# Patient Record
Sex: Male | Born: 1940 | Race: White | Hispanic: No | Marital: Married
Health system: Midwestern US, Academic
[De-identification: ages and names within clinical notes are randomized; demographics above are authoritative.]

## PROBLEM LIST (undated history)

## (undated) DIAGNOSIS — R748 Abnormal levels of other serum enzymes: Secondary | ICD-10-CM

## (undated) DIAGNOSIS — E1165 Type 2 diabetes mellitus with hyperglycemia: Principal | ICD-10-CM

## (undated) DIAGNOSIS — L259 Unspecified contact dermatitis, unspecified cause: Secondary | ICD-10-CM

## (undated) DIAGNOSIS — M48062 Spinal stenosis, lumbar region with neurogenic claudication: Secondary | ICD-10-CM

## (undated) DIAGNOSIS — E119 Type 2 diabetes mellitus without complications: Principal | ICD-10-CM

## (undated) DIAGNOSIS — M5442 Lumbago with sciatica, left side: Secondary | ICD-10-CM

## (undated) DIAGNOSIS — E559 Vitamin D deficiency, unspecified: Secondary | ICD-10-CM

## (undated) DIAGNOSIS — E7800 Pure hypercholesterolemia, unspecified: Secondary | ICD-10-CM

## (undated) DIAGNOSIS — Z1211 Encounter for screening for malignant neoplasm of colon: Secondary | ICD-10-CM

## (undated) DIAGNOSIS — Z125 Encounter for screening for malignant neoplasm of prostate: Secondary | ICD-10-CM

## (undated) DIAGNOSIS — M5441 Lumbago with sciatica, right side: Secondary | ICD-10-CM

## (undated) DIAGNOSIS — K2970 Gastritis, unspecified, without bleeding: Secondary | ICD-10-CM

## (undated) DIAGNOSIS — E039 Hypothyroidism, unspecified: Principal | ICD-10-CM

## (undated) LAB — HM DIABETES EYE EXAM

## (undated) LAB — HM DM FOOT MONOFILAMENT AND PULSE: hm dm foot monofilament and pulse: NORMAL

---

## 2003-05-27 NOTE — Unmapped (Signed)
Signed by Hubbard Hartshorn MD on 05/27/2003 at 00:00:00  Polysomnography      Imported By: Betsey Amen 01/02/2009 10:10:58    _____________________________________________________________________    External Attachment:    Please see [employer] EMR for this document.

## 2003-06-11 NOTE — Unmapped (Signed)
Signed by Hubbard Hartshorn MD on 06/11/2003 at 00:00:00  Polysomnography      Imported By: Betsey Amen 01/02/2009 10:11:33    _____________________________________________________________________    External Attachment:    Please see [employer] EMR for this document.

## 2003-11-29 NOTE — Unmapped (Signed)
Signed by Hubbard Hartshorn MD on 11/29/2003 at 00:00:00  Physician Order      Imported By: Betsey Amen 01/02/2009 10:11:58    _____________________________________________________________________    External Attachment:    Please see [employer] EMR for this document.

## 2003-12-06 NOTE — Unmapped (Signed)
Signed by Hubbard Hartshorn MD on 12/06/2003 at 00:00:00  CPAP Setup      Imported By: Betsey Amen 01/02/2009 10:12:24    _____________________________________________________________________    External Attachment:    Please see [employer] EMR for this document.

## 2005-05-26 NOTE — Unmapped (Signed)
Signed by   LinkLog[employer] on 05/26/2005 at 09:18:17    Appointment status changed to no show by  [employer] on 05/26/2005 9:18 AM.    No Show Comments  ----------------  (MAS) PHYSICAL EXAM    Appointment Information  -----------------------  Appt Type:         Date:  Thursday, May 26, 2005       Time:  8:30 AM for 45 min    Urgency:  Routine    Made By:  Jannett Celestine   To Visit:  [name] MD     Reason:  (MAS) PHYSICAL EXAM    Appt Comments  -------------  -- 05/26/05 9:18: (ADAMS[facility]) NO SHOW --  (MAS) PHYSICAL EXAM  PHY    -- 03/22/05 11:34: (ADAMSAP) BOOKED --  Routine  at 05/26/2005 8:30 AM for 45 min  (MAS) PHYSICAL EXAM  PHY

## 2005-08-08 NOTE — Unmapped (Signed)
Signed by Lolita Patella. Gerrety RMA on 08/08/2005 at 08:12:10      Preload Clinical Lists   Problems added:   HYPERTENSION (ICD-401.9)  HYPERLIPIDEMIA (ICD-272.4)  ATRIAL FIBRILLATION (ICD-427.31)  CHF (ICD-428.0)    Medications added:   PACERONE 200 MG TABS (AMIODARONE HCL) 1 1/2 po daly  COUMADIN TABS (WARFARIN SODIUM TABS) As directed  VYTORIN 10-20 MG TABS (EZETIMIBE-SIMVASTATIN) one po daily  HYZAAR 50-12.5 MG TABS (LOSARTAN POTASSIUM-HCTZ) one po daily  CORGARD 20 MG TABS (NADOLOL) 1 po twice a day

## 2005-08-12 LAB — MICROALBUMIN (RANDOM UR)
Creatinine, Random Urine: 209 mL/min (ref 20–370)
Microalb / UCreat: 6 mg/g (ref <30)
Microalb, Ur: 1.2 mg/dL

## 2005-08-12 LAB — PROTIME-INR
INR: 1.5
Protime: 15.2 s (ref 9.0–11.5)

## 2005-08-12 LAB — PSA, TOTAL AND FREE: PSA: 0.9 ng/mL (ref <=4.0)

## 2005-08-12 NOTE — Unmapped (Signed)
Signed by [name] MD on 08/15/2005 at 09:34:51  Patient: [name]  Note: All result statuses are Final unless otherwise noted.    Tests: (1) MICROALBUMIN, RANDOM URINE (W/CREATININE) ([id number])   CREATININE, RANDOM URINE                              209 mg/dL                   38-756    MICROALBUMIN              1.2 mg/dL      REFERENCE RANGE      NOT ESTABLISHED     MICROALBUMIN/CREATININE RATIO, RANDOM URINE                              6 mcg/mg creat              <30             THE ADA (DIABETES CARE 26:S94-S98, 2003) DEFINES      ABNORMALITIES IN ALBUMIN EXCRETION AS FOLLOWS:             CATEGORY         RESULT (MCG/MG CREATININE)             NORMAL                    <30      MICROALBUMINURIA         30-299       CLINICAL ALBUMINURIA   > OR = 300             THE ADA RECOMMENDS THAT AT LEAST TWO OF THREE      SPECIMENS COLLECTED WITHIN A 3-6 MONTH PERIOD BE      ABNORMAL BEFORE CONSIDERING A PATIENT TO BE      WITHIN A DIAGNOSTIC CATEGORY.    Note: An exclamation mark (!) indicates a result that was not dispersed into   the flowsheet.  Document Creation Date: 08/13/2005 6:47 AM  _______________________________________________________________________    (1) Order result status: Final  Collection or observation date-time: 08/12/2005 09:55  Requested date-time:   Receipt date-time: 08/12/2005 21:07  Reported date-time: 08/13/2005 06:00  Referring Physician:    Ordering Physician: [name] [facility])  Specimen Source: R  Source: Arline Asp Order Number: [id number]  Lab site: [facility] [place]      [address]      [place]  [facility]  [address]      -----------------    The following lab values were dispersed to the flowsheet  with no units conversion:      CREATININE, RANDOM URINE, 209 MG/DL, (F)  expected units: mL/min    MICROALBUMIN/CREATININE RATIO, RANDOM URINE, 6 MCG/MG CREAT, (F)  expected   units: mg/g

## 2005-08-12 NOTE — Unmapped (Signed)
Signed by [name] MD on 08/15/2005 at 09:35:21  Patient: [name]  Note: All result statuses are Final unless otherwise noted.    Tests: (1) PROTHROMBIN TIME WITH INR ([id number])   INTERNATIONAL NORMALIZED RATIO (INR)                              1.5                INR REFERENCE INTERVAL APPLIES TO PATIENTS                NOT ON ANTICOAGULANT THERAPY:                    0.9- 1.1                       SUGGESTED INR THERAPEUTIC RANGE FOR ORAL                ANTICOAGULANT THERAPY (STABLY ANTICOAGULATED                PATIENTS)                                 ROUTINE THERAPY:                       2.0- 3.0                                 RECURRENT MYOCARDIAL INFARCTION                          OR MECHANICAL PROSTHETIC VALVES:       2.5- 3.5                  PROTHROMBIN TIME     [H]  15.2 sec                    9.0-11.5    Note: An exclamation mark (!) indicates a result that was not dispersed into   the flowsheet.  Document Creation Date: 08/13/2005 6:47 AM  _______________________________________________________________________    (1) Order result status: Final  Collection or observation date-time: 08/12/2005 09:55  Requested date-time:   Receipt date-time: 08/12/2005 21:07  Reported date-time: 08/13/2005 06:00  Referring Physician:    Ordering Physician: [name] [facility])  Specimen Source: P  Source: Arline Asp Order Number: [id number]  Lab site: [facility] [place]      [address]      [place]  [place]  [address]

## 2005-08-12 NOTE — Unmapped (Signed)
Signed by [name] MD on 08/15/2005 at 09:36:21  Patient: [name]  Note: All result statuses are Final unless otherwise noted.    Tests: (1) PSA, TOTAL ([id number])    PSA, TOTAL                0.9 ng/mL                   < OR = 4.0      PSA VALUES FROM DIFFERENT ASSAY METHODS CANNOT BE      USED INTERCHANGEABLY. THIS ASSAY WAS PERFORMED      USING THE BAYER CHEMILUMINESCENT METHOD.    Note: An exclamation mark (!) indicates a result that was not dispersed into   the flowsheet.  Document Creation Date: 08/13/2005 6:47 AM  _______________________________________________________________________    (1) Order result status: Final  Collection or observation date-time: 08/12/2005 09:55  Requested date-time:   Receipt date-time: 08/12/2005 21:07  Reported date-time: 08/13/2005 06:00  Referring Physician:    Ordering Physician: [name] [facility])  Specimen Source: S  Source: Arline Asp Order Number: [id number]  Lab site: [facility] [place]      [address]      [place]  [place]  [address]

## 2005-08-15 NOTE — Unmapped (Signed)
Signed by Lanney Gins MA on 08/18/2005 at 10:58:27      Follow-up for Test Results:   Comments: His INR is only 1.5 -- please find out his current Warfarin dose.  Also, his PSA and microalbumin ( urine test ) are normal -- please let him know.  Follow-up by: [name] MD,  Aug 15, 2005 9:37 AM    Additional Follow-up for Test Results:   Comments: lm to cb  Additional Follow-up by: Lanney Gins MA,  Aug 15, 2005 10:48 AM    Additional Follow-up for Test Results:   Comments: lmom to cb  Additional Follow-up by: Lanney Gins MA,  Aug 16, 2005 7:17 PM    LMOM TO [facility]      pt taking 2.5 mg on m,w,f  - 5mg  all other days    Have him increase his coumadin to 2.5mg  on Wednesdays and 5mg  all other days, recheck in 3 week and prn    LN MD         lmom sh

## 2005-08-16 NOTE — Unmapped (Signed)
Signed by Lanney Gins MA on 08/16/2005 at 15:52:37    Phone Note   Patient Call  Call back at Home Phone: [phone]  Caller: spouse -  Call for: [facility]    Summary of Call: Pt's wife calling with information on Coumadin    Current: 2.5mg  M W F                5mg  all other days    if anything needs to change, pls call wife - [name] is out of town     Initial call taken by: Emmaline Life,  Aug 16, 2005 2:28 PM      Follow-up for Phone Call   change to 5mg  all days except 2.5mg  on wednesday and recheck in four weeks and prn  Follow-up by: [name] MD,  Aug 16, 2005 3:03 PM    Additional Follow-up for Phone Call   wife informed  Additional Follow-up by: Lanney Gins MA,  Aug 16, 2005 3:52 PM

## 2005-08-18 NOTE — Unmapped (Signed)
Signed by [name] MD on 08/18/2005 at 10:58:17    Clinical Lists Changes    Medications:  Changed medication from COUMADIN TABS (WARFARIN SODIUM TABS) As directed to COUMADIN 5 MG TABS (WARFARIN SODIUM) 1 daily except one half (2.5 mg) on Wednesdays

## 2005-09-09 NOTE — Unmapped (Signed)
Signed by Lanney Gins MA on 09/09/2005 at 16:59:45    Phone Note   Patient Call  Call back at Home Phone: [phone]  Caller: patient  Call for: [facility]    Summary of Call: Had Physical about 1 month ago - did not get letter, would like to get it please         Initial call taken by: Emmaline Life,  September 09, 2005 12:41 PM      Follow-up for Phone Call   we have a normal psa and a normal urine test only -- please check paper chart to see if anything else came in/was ordered  Follow-up by: [name] MD,  September 09, 2005 4:25 PM    Additional Follow-up for Phone Call   no other tests ordered. pt inf.  Additional Follow-up by: Lanney Gins MA,  September 09, 2005 4:59 PM

## 2005-09-12 LAB — PROTIME-INR
INR: 2.4
Protime: 22.8 s (ref 9.0–11.5)

## 2005-09-12 NOTE — Unmapped (Signed)
Signed by [name] MD on 09/12/2005 at 00:00:00  Disclosure      Imported By: Ileene Hutchinson 12/19/2005 09:00:29    _____________________________________________________________________    External Attachment:    Please see [employer] EMR for this document.

## 2005-09-12 NOTE — Unmapped (Signed)
Signed by [name] MD on 09/13/2005 at 05:56:44  Patient: [name]  Note: All result statuses are Final unless otherwise noted.    Tests: (1) PROTHROMBIN TIME WITH INR ([id number])   INTERNATIONAL NORMALIZED RATIO (INR)                              2.4                INR REFERENCE INTERVAL APPLIES TO PATIENTS                NOT ON ANTICOAGULANT THERAPY:                    0.9- 1.1                       SUGGESTED INR THERAPEUTIC RANGE FOR ORAL                ANTICOAGULANT THERAPY (STABLY ANTICOAGULATED                PATIENTS)                                 ROUTINE THERAPY:                       2.0- 3.0                                 RECURRENT MYOCARDIAL INFARCTION                          OR MECHANICAL PROSTHETIC VALVES:       2.5- 3.5                  PROTHROMBIN TIME     [H]  22.8 sec                    9.0-11.5    Note: An exclamation mark (!) indicates a result that was not dispersed into   the flowsheet.  Document Creation Date: 09/13/2005 3:03 AM  _______________________________________________________________________    (1) Order result status: Final  Collection or observation date-time: 09/12/2005 10:03  Requested date-time:   Receipt date-time: 09/12/2005 21:48  Reported date-time: 09/13/2005 02:00  Referring Physician:    Ordering Physician: [name] [facility])  Specimen Source: P  Source: Arline Asp Order Number: [id number]  Lab site: [facility] [place]      [address]      [place]  [place]  [address]

## 2005-09-12 NOTE — Unmapped (Signed)
Signed by [name] MD on 09/12/2005 at 00:00:00  Privacy Notice      Imported By: Ileene Hutchinson 12/19/2005 09:00:45    _____________________________________________________________________    External Attachment:    Please see [employer] EMR for this document.

## 2005-09-12 NOTE — Unmapped (Addendum)
Signed by Lanney Gins MA on 09/12/2005 at 09:35:26      Coumadin Management Preload   Indication for Coumadin Therapy: Atrial Fibrillation    Vital Signs:     Pulse: 80  BP: 158/90    Relative Factors:   Any missed doses: no  Any new meds/antibiotics: yes  If yes, describe: d/c pacerone x 1 month  Any dietary changes: no  Any bleeding problems: no    Previous values:   New Coumadin Regimen 5 mg qd except 2.5 on wednesdays  Recorded by: Lanney Gins MA on September 12, 2005 9:34 AM    Signed by Tama Headings MA on 04/13/2006 at 11:06:31

## 2005-09-13 NOTE — Unmapped (Signed)
Signed by Tama Headings MA on 09/13/2005 at 15:59:39    Phone Note   Patient Call  Call back at Home Phone: [phone]  Caller: patient  Call for: [facility]    Summary of Call: Wants to know name of ENT to see for his sinus problem. Can leave on voicemail.     Initial call taken by: Barnetta Hammersmith,  September 13, 2005 3:08 PM      Follow-up for Phone Call   [facility]  Follow-up by: [name] MD,  September 13, 2005 3:47 PM    Additional Follow-up for Phone Call   Called pt with info re Dr. Tobie Lords.  Gave name, address, & phone #  Additional Follow-up by: Tama Headings MA,  September 13, 2005 3:59 PM

## 2005-09-13 NOTE — Unmapped (Signed)
Signed by Lanney Gins MA on 09/13/2005 at 12:41:46      Coumadin Management   Indication for Coumadin Therapy: Atrial Fibrillation    Patient PT: 22.8 (09/12/2005 10:03:00 AM)  INR: 2.4 (09/12/2005 10:03:00 AM)    Relative Factors:   Any missed doses: no  Any new meds/antibiotics: no  Any dietary changes: no  Any bleeding problems: no  Next Protime in: 1 month    Provider Instructions   Advise patient: PT/INR is OK.  No change in dosage of coumadin.  Results reviewed by: [name] MD on September 13, 2005 5:56 AM    Nursing Notes   Instructions given to: Patient  Called by: Lanney Gins MA on September 13, 2005 12:41 PM

## 2005-09-28 NOTE — Unmapped (Signed)
Signed by [name] MD on 09/28/2005 at 00:00:00  ENT      Imported By: Ileene Hutchinson 12/28/2005 16:32:58    _____________________________________________________________________    External Attachment:    Please see [employer] EMR for this document.

## 2005-10-27 NOTE — Unmapped (Signed)
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  Initial call taken by: Emmaline Life,  October 27, 2005 11:53 AM      Follow-up for Phone Call   Usually best if he's off he coumadin first for three days.  Follow-up by: [name] MD,  October 27, 2005 12:41 PM    Additional Follow-up for Phone Call   dr Salli Real inf  Additional Follow-up by: Lanney Gins MA,  October 27, 2005 12:45 PM

## 2005-10-28 LAB — PROTIME-INR
INR: 1.6
Protime: 15.6 s — ABNORMAL HIGH (ref 9.0–11.5)

## 2005-10-28 NOTE — Unmapped (Addendum)
Signed by Christie Nottingham LPN on 73/22/0254 at 09:56:24      Coumadin Management Preload   Indication for Coumadin Therapy: Atrial Fibrillation    Vital Signs:     Pulse: 68 (regular)  BP: 164/94    Relative Factors:   Any missed doses: no  Any new meds/antibiotics: no  Any dietary changes: no  Any bleeding problems: no  Comments: pt is to hold coumadin starting today for dental tx on 10/31/05    Previous values:   Target INR:   2.0-3.0  Treatment Duration: indefinitely    Signed by Tama Headings MA on 04/07/2006 at 16:13:50          Clinical Lists Changes    Orders:  [place] [id number] - Ofc Vst, Est Level I ([id number]) - Signed

## 2005-10-28 NOTE — Unmapped (Signed)
Signed by [name] MD on 10/30/2005 at 21:46:09  Patient: [name]  Note: All result statuses are Final unless otherwise noted.    Tests: (1) PROTHROMBIN TIME WITH INR ([id number])   INTERNATIONAL NORMALIZED RATIO (INR)                              1.6                INR REFERENCE INTERVAL APPLIES TO PATIENTS                NOT ON ANTICOAGULANT THERAPY:                    0.9- 1.1                       SUGGESTED INR THERAPEUTIC RANGE FOR ORAL                ANTICOAGULANT THERAPY (STABLY ANTICOAGULATED                PATIENTS)                                 ROUTINE THERAPY:                       2.0- 3.0                                 RECURRENT MYOCARDIAL INFARCTION                          OR MECHANICAL PROSTHETIC VALVES:       2.5- 3.5                  PROTHROMBIN TIME     [H]  15.6 sec                    9.0-11.5    Note: An exclamation mark (!) indicates a result that was not dispersed into   the flowsheet.  Document Creation Date: 10/29/2005 1:19 AM  _______________________________________________________________________    (1) Order result status: Final  Collection or observation date-time: 10/28/2005 09:14  Requested date-time:   Receipt date-time: 10/28/2005 23:35  Reported date-time: 10/29/2005 01:00  Referring Physician:    Ordering Physician: [name] [facility])  Specimen Source: P  Source: Arline Asp Order Number: [id number]  Lab site: [facility] [place]      [address]      [place]  [place]  [address]

## 2005-10-30 NOTE — Unmapped (Signed)
Signed by Lanney Gins MA on 10/31/2005 at 14:39:06      Coumadin Management   Indication for Coumadin Therapy: Atrial Fibrillation    Patient PT: 15.6 (10/28/2005 9:14:00 AM)  INR: 1.6 (10/28/2005 9:14:00 AM)  New Coumadin Regimen 5mg  daily  Next Protime in: 1 month    Provider Instructions   Advise patient: PT/INR is too low and the dose of coumadin needs to be increased.  Results reviewed by: [name] MD on October 30, 2005 9:47 PM    Nursing Notes   Instructions given to: Patient  Called by: Lanney Gins MA on October 31, 2005 2:39 PM

## 2005-12-22 LAB — PROTIME-INR
INR: 2.9
Protime: 27.3 s (ref 9.0–11.5)

## 2005-12-22 LAB — OFFICE VISIT LAB RESULTS
INR: 1.6
Protime: 15.6 s

## 2005-12-22 NOTE — Unmapped (Signed)
Signed by [name] MD on 12/23/2005 at 08:12:08  Patient: [name]  Note: All result statuses are Final unless otherwise noted.    Tests: (1) PROTHROMBIN TIME WITH INR ([id number])   INTERNATIONAL NORMALIZED RATIO (INR)                              2.9                INR REFERENCE INTERVAL APPLIES TO PATIENTS                NOT ON ANTICOAGULANT THERAPY:                    0.9- 1.1                       SUGGESTED INR THERAPEUTIC RANGE FOR ORAL                ANTICOAGULANT THERAPY (STABLY ANTICOAGULATED                PATIENTS)                                 ROUTINE THERAPY:                       2.0- 3.0                                 RECURRENT MYOCARDIAL INFARCTION                          OR MECHANICAL PROSTHETIC VALVES:       2.5- 3.5                  PROTHROMBIN TIME     [H]  27.3 sec                    9.0-11.5    Note: An exclamation mark (!) indicates a result that was not dispersed into   the flowsheet.  Document Creation Date: 12/23/2005 6:58 AM  _______________________________________________________________________    (1) Order result status: Final  Collection or observation date-time: 12/22/2005 10:02  Requested date-time:   Receipt date-time: 12/22/2005 15:01  Reported date-time: 12/23/2005 06:00  Referring Physician:    Ordering Physician: [name] [facility])  Specimen Source: P  Source: Arline Asp Order Number: [id number] [id number]  Lab site: [facility] [place]      [address]      [place]  [place]  [address]

## 2005-12-22 NOTE — Unmapped (Signed)
Signed by [name] MD on 12/22/2005 at 13:11:03    Phone Note   Patient Call  Call back at Home Phone: [phone]  Caller: patient  Call for: [facility]    Reason for Call: refill medication  Summary of Call: Rf Glybur/metformin 90day written for mail order. Mail to pt [address] [place] [phone].     Initial call taken by: Barnetta Hammersmith,  December 22, 2005 9:18 AM      New Medications:  GLYBURIDE-METFORMIN 2.5-500 MG TABS (GLYBURIDE-METFORMIN) 1 twice a day    Follow-up for Phone Call   Not listed in the EMR -- please get the paper chart or confirm the dose with the patient.  Follow-up by: [name] MD,  December 22, 2005 10:39 AM    Additional Follow-up for Phone Call   waiting on call back  left message for patient    Additional Follow-up for Phone Call   dosage is 2.5-500 Glybur-metfor  This is for mail in  Additional Follow-up by: Deloris Ping,  December 22, 2005 12:24 PM    Additional Follow-up for Phone Call   I don't have the paper chart and this is not in the EMR -- twice a day?  three times a day? how many pills a day?  Additional Follow-up by: [name] MD,  December 22, 2005 12:35 PM  Prescriptions:  GLYBURIDE-METFORMIN 2.5-500 MG TABS (GLYBURIDE-METFORMIN) 1 twice a day  #180 x 3   Entered and Authorized by: [name] MD   Signed by: [name] MD on 12/22/2005   Method used: Print then Give to Patient

## 2005-12-22 NOTE — Unmapped (Signed)
Signed by Karie Mainland on 12/22/2005 at 09:16:54      Coumadin Management Preload   Indication for Coumadin Therapy: Atrial Fibrillation    Vital Signs: Wt: 263 lbs.      Pulse: 56 (regular)    Patient appears to be in acute distress: no  BP: 140/84    Relative Factors:   Any missed doses: no  Any new meds/antibiotics: no  Any dietary changes: no  Any bleeding problems: no    Previous values:   Date of Test: 12/22/2005  Last PT:   15.6  Last INR:   1.6  Target INR:   2.0-3.0  Treatment Duration: indefinitely  Current Coumadin Regimen 5mg  daily

## 2005-12-23 NOTE — Unmapped (Signed)
Signed by Karie Mainland on 12/23/2005 at 09:19:55      Coumadin Management     Patient PT: 27.3 (12/22/2005 10:02:00 AM)  INR: 2.9 (12/22/2005 10:02:00 AM)  Target INR Range: 2.0-3.0  Duration Tx: indefinitely  Indication for Coumadin Therapy: Atrial Fibrillation  Current Coumadin Regimen 5mg  daily  New Coumadin Regimen 5mg  daily  Next Protime in: 1 month    Follow-up for Test Results:   Action taken: PT/INR is OK.  No change in dosage of coumadin.  Follow-up by: [name] MD,  December 23, 2005 8:12 AM    Relative Factors:   Any missed doses: no  Any new meds/antibiotics: no  Any dietary changes: no  Any bleeding problems: no  Called by: Karie Mainland,  December 23, 2005 9:19 AM    Additional Follow-up Details:   Instructions given to: Patient  Action Taken: The patient/family/other verbalizes a clear understanding of the above plan.  Additional Follow-up by: Karie Mainland,  December 23, 2005 9:19 AM

## 2006-01-24 LAB — OFFICE VISIT LAB RESULTS
INR: 2.9
Protime: 27.3 s

## 2006-01-24 LAB — PROTIME-INR
INR: 1.7
Protime: 17.2 s — ABNORMAL HIGH (ref 9.0–11.5)

## 2006-01-24 NOTE — Unmapped (Addendum)
Signed by Karie Mainland on 01/24/2006 at 11:48:20      Coumadin Management Preload   Indication for Coumadin Therapy: Atrial Fibrillation    Vital Signs: Wt: 160 lbs.      Wt chg (lbs): -103  Pulse: 72 (irregular)    Patient appears to be in acute distress: no  BP: 142/90    Relative Factors:   Any missed doses: no  Any new meds/antibiotics: no  Any dietary changes: no  Any bleeding problems: no    Previous values:   Date of Test: 01/24/2006  Last PT:   27.3  Last INR:   2.9  Target INR:   2.0-3.0  Treatment Duration: indefinitely  Current Coumadin Regimen 5mg  daily    Signed by Tama Headings MA on 04/13/2006 at 11:08:31

## 2006-01-24 NOTE — Unmapped (Signed)
Signed by [name] MD on 01/24/2006 at 17:27:38  Patient: [name]  Note: All result statuses are Final unless otherwise noted.    Tests: (1) PROTHROMBIN TIME WITH INR ([id number])   INTERNATIONAL NORMALIZED RATIO (INR)                              1.7                INR REFERENCE INTERVAL APPLIES TO PATIENTS                NOT ON ANTICOAGULANT THERAPY:                    0.9- 1.1                       SUGGESTED INR THERAPEUTIC RANGE FOR ORAL                ANTICOAGULANT THERAPY (STABLY ANTICOAGULATED                PATIENTS)                                 ROUTINE THERAPY:                       2.0- 3.0                                 RECURRENT MYOCARDIAL INFARCTION                          OR MECHANICAL PROSTHETIC VALVES:       2.5- 3.5                  PROTHROMBIN TIME     [H]  17.2 sec                    9.0-11.5    Note: An exclamation mark (!) indicates a result that was not dispersed into   the flowsheet.  Document Creation Date: 01/24/2006 3:07 PM  _______________________________________________________________________    (1) Order result status: Final  Collection or observation date-time: 01/24/2006 10:40  Requested date-time:   Receipt date-time: 01/24/2006 13:41  Reported date-time: 01/24/2006 15:00  Referring Physician:    Ordering Physician: [name] [facility])  Specimen Source: P  Source: Arline Asp Order Number: [id number]  Lab site: [facility]      [address]      [place]  [place]  [address]

## 2006-01-24 NOTE — Unmapped (Signed)
Signed by Karie Mainland on 01/25/2006 at 11:20:45      Coumadin Management     Patient PT: 27.3 (01/24/2006 11:46:50 AM)  INR: 2.9 (01/24/2006 11:46:50 AM)  Target INR Range: 2.0-3.0  Duration Tx: indefinitely  Indication for Coumadin Therapy: Atrial Fibrillation  Current Coumadin Regimen 5mg  daily  New Coumadin Regimen 5mg  daily except 7.5 mg on tuesdays and fridays  Next Protime in: 1 month    Follow-up for Test Results:   Action taken: PT/INR is too low and the dose of coumadin needs increased.  Follow-up by: [name] MD,  January 24, 2006 5:28 PM    Relative Factors:   Any missed doses: no  Any new meds/antibiotics: no  Any dietary changes: no  Any bleeding problems: no  Called by: Karie Mainland,  January 25, 2006 11:20 AM    Additional Follow-up Details:   Instructions given to: Patient  Action Taken: The patient/family/other verbalizes a clear understanding of the above plan.  Additional Follow-up by: Karie Mainland,  January 25, 2006 11:20 AM    New Medications:  COUMADIN 5 MG TABS (WARFARIN SODIUM) one each day except 1.5 (7.5 mg) a day on Tuesdays and Fridays

## 2006-02-27 LAB — PROTIME-INR
INR: 1.2
Protime: 12.1 s (ref 9.0–11.5)

## 2006-02-27 LAB — OFFICE VISIT LAB RESULTS
INR: 2.9
Protime: 27.3 s

## 2006-02-27 NOTE — Unmapped (Signed)
Signed by Sheryle Spray MA on 02/27/2006 at 10:10:11      Coumadin Management Preload   Indication for Coumadin Therapy: Atrial Fibrillation    Vital Signs:     Pulse: 76 (regular)    Patient appears to be in acute distress: no  BP: 138/84    Relative Factors:   Any missed doses: no  Any new meds/antibiotics: no  Any dietary changes: no  Any bleeding problems: no  Comments: please fax results to Dr. Gennie Alma [phone].  pt has been holding his coumadin since 02/24/06.  Pt is scheduled for a procedure.    Previous values:   Date of Test: 01/24/2006  Last PT:   27.3  Last INR:   2.9  Treatment Duration: indefinitely  Current Coumadin Regimen 5mg  daily except 7.5 mg on tuesdays and fridays

## 2006-02-27 NOTE — Unmapped (Signed)
Signed by [name] MD on 02/27/2006 at 18:39:14  Patient: [name]  Note: All result statuses are Final unless otherwise noted.    Tests: (1) PROTHROMBIN TIME WITH INR ([id number])   INTERNATIONAL NORMALIZED RATIO (INR)                              1.2                INR REFERENCE INTERVAL APPLIES TO PATIENTS                NOT ON ANTICOAGULANT THERAPY:                    0.9- 1.1                       SUGGESTED INR THERAPEUTIC RANGE FOR ORAL                ANTICOAGULANT THERAPY (STABLY ANTICOAGULATED                PATIENTS)                                 ROUTINE THERAPY:                       2.0- 3.0                                 RECURRENT MYOCARDIAL INFARCTION                          OR MECHANICAL PROSTHETIC VALVES:       2.5- 3.5                  PROTHROMBIN TIME     [H]  12.1 sec                    9.0-11.5    Note: An exclamation mark (!) indicates a result that was not dispersed into   the flowsheet.  Document Creation Date: 02/27/2006 4:16 PM  _______________________________________________________________________    (1) Order result status: Final  Collection or observation date-time: 02/27/2006 10:55  Requested date-time:   Receipt date-time: 02/27/2006 14:52  Reported date-time: 02/27/2006 16:00  Referring Physician:    Ordering Physician: [name] [facility])  Specimen Source: P  Source: Arline Asp Order Number: [id number]  Lab site: [facility] [place]      [address]      [place]  [place]  [address]

## 2006-02-27 NOTE — Unmapped (Signed)
Signed by Sheryle Spray MA on 02/28/2006 at 18:22:45      Coumadin Management     Patient PT: 12.1 (02/27/2006 10:55:00 AM)  INR: 1.2 (02/27/2006 10:55:00 AM)  Target INR Range: 2.0-3.0  Duration Tx: indefinitely  Indication for Coumadin Therapy: Atrial Fibrillation  Current Coumadin Regimen 5mg  daily except 7.5 mg on tuesdays and fridays  New Coumadin Regimen 1.5 (7.5 mg) each day except 1 (5 mg) a day on Tuesdays and Fridays  Next Protime in: 2 weeks    Follow-up for Test Results:   Action taken: PT/INR is too low and the dose of coumadin needs increased.  Follow-up by: [name] MD,  February 27, 2006 6:40 PM    New Medications:  COUMADIN 5 MG TABS (WARFARIN SODIUM) 1.5 (7.5 mg) each day except 1 (5 mg) a day on Tuesdays and Fridays  COUMADIN 5 MG TABS (WARFARIN SODIUM) 1 (5 mg) each day except 1.5 (7.5 mg) a day on Tuesdays and Fridays    Additional Follow-up for Test Results:   Comments: pt is having a procedure today.  Endoscopy with Dr. Gennie Alma.  please advise when he should resume coumadin and what dose?  see coumadin preload.  Additional Follow-up by: Sheryle Spray MA,  February 28, 2006 9:37 AM    Additional Follow-up for Test Results:   Comments: I understand -- do not start those changes -- he should recheck two weeks after resuming the coumadin.  Additional Follow-up by: [name] MD,  February 28, 2006 9:52 AM    Additional Follow-up for Test Results:   Action Taken: The patient/family/other verbalizes a clear understanding of the above plan.  Additional Follow-up by: Sheryle Spray MA,  February 28, 2006 6:22 PM

## 2006-02-28 NOTE — Unmapped (Signed)
Signed by Verdie Shire MD on 02/28/2006 at 00:00:00  note from  [facility]      Imported By: Ileene Hutchinson 05/06/2006 08:41:24    _____________________________________________________________________    External Attachment:    Please see [employer] EMR for this document.

## 2006-02-28 NOTE — Unmapped (Signed)
Signed by Rosalyn Gess MD on 02/28/2006 at 00:00:00  [facility]      Imported By: Coletta Memos 06/01/2009 23:54:50    _____________________________________________________________________    External Attachment:    Please see [employer] EMR for this document.

## 2006-03-13 LAB — URINALYSIS W/RFL TO MICROSCOPIC
Blood, UA: NEGATIVE
Glucose, UA: NEGATIVE g/dL
Ketones, UA: NEGATIVE
Leukocytes, UA: NEGATIVE
Nitrite, UA: NEGATIVE
Protein, UA: NEGATIVE
Specific Gravity, UA: 1.02 (ref 1.001–1.035)
Urobilinogen, UA: NEGATIVE
pH, UA: 6.5 (ref 5.0–8.0)

## 2006-03-13 LAB — LIPID PANEL
Chol/HDL Ratio: 4.3 (ref <=5.0)
Cholesterol, Total: 125 mg/dL (ref 125–200)
HDL: 29 mg/dL (ref >=40)
LDL Cholesterol: 71 mg/dL (ref <130)
Triglycerides: 127 mg/dL (ref <150)

## 2006-03-13 LAB — OFFICE VISIT LAB RESULTS
Fecal Occult Bld: NEGATIVE
Glucose, UA: NEGATIVE
Ketones, UA: NEGATIVE
Leukocytes, UA: NEGATIVE
Nitrite, UA: NEGATIVE
Protein, UA: NEGATIVE
Specific Gravity, UA: 1.01
Urobilinogen, UA: NORMAL
pH, UA: 6.5

## 2006-03-13 LAB — COMPREHENSIVE METABOLIC PANEL
A/G Ratio: 1.7 (ref 1.0–2.1)
ALT: 17 U/L (ref 9–60)
AST: 14 U/L (ref 10–35)
Albumin: 4.1 g/dL (ref 3.6–5.1)
Alkaline Phosphatase: 65 U/L (ref 40–115)
BUN/Creatinine Ratio: 20 (ref 6–22)
BUN: 22 mg/dL (ref 7–25)
CO2: 27 mmol/L (ref 21–33)
Calcium: 8.9 mg/dL (ref 8.6–10.2)
Chloride: 104 mmol/L (ref 98–110)
Creatinine: 1.1 mg/dL (ref 0.50–1.30)
GFR MDRD Non Af Amer: >60 mL/min (ref >=60)
Globulin, Total: 2.4 g/dL (ref 2.1–3.7)
Glucose: 97 mg/dL (ref 65–99)
Potassium: 4.2 mmol/L (ref 3.5–5.3)
Sodium: 140 mmol/L (ref 135–146)
Total Bilirubin: 0.6 mg/dL (ref 0.2–1.2)
Total Protein: 6.5 g/dL (ref 6.2–8.3)

## 2006-03-13 LAB — PROTIME-INR
INR: 3.1
Protime: 29.8 s (ref 9.0–11.5)

## 2006-03-13 LAB — HEMOGLOBIN A1C: Hemoglobin A1C: 6.8 %

## 2006-03-13 LAB — MICROALBUMIN (RANDOM UR)
Creatinine, Random Urine: 124 mL/min (ref 20–370)
Microalb / UCreat: 6 mg/g (ref <30)
Microalb, Ur: 0.7 mg/dL

## 2006-03-13 LAB — TSH: TSH: 0.01 u[IU]/mL — ABNORMAL LOW (ref 0.40–4.50)

## 2006-03-13 LAB — T4, FREE: Free T4: 1.7 ng/dL (ref 0.8–1.8)

## 2006-03-13 NOTE — Unmapped (Signed)
Signed by [name] MD on 03/14/2006 at 06:49:19  Patient: [name]  Note: All result statuses are Final unless otherwise noted.    Tests: (1) HEMOGLOBIN A1c (QDL-496)   HEMOGLOBIN A1c (% of total Hgb)                         [H]  6.8 %      NON-DIABETIC: <6.0%                   REPORT COMMENT:      FASTING    Note: An exclamation mark (!) indicates a result that was not dispersed into   the flowsheet.  Document Creation Date: 03/14/2006 12:42 AM  _______________________________________________________________________    (1) Order result status: Final  Collection or observation date-time: 03/13/2006 15:24  Requested date-time:   Receipt date-time: 03/13/2006 20:19  Reported date-time: 03/14/2006 00:00  Referring Physician:    Ordering Physician: [name] [facility])  Specimen Source: B  Source: Arline Asp Order Number: [id number]  Lab site: [facility] [place]      [address]      [place]  [place]  [address]

## 2006-03-13 NOTE — Unmapped (Signed)
Signed by [name] MD on 03/14/2006 at 06:49:19  Patient: [name]  Note: All result statuses are Final unless otherwise noted.    Tests: (1) LIPID PANEL (QDL-7600)    TRIGLYCERIDES             127 mg/dL                   <295    CHOLESTEROL, TOTAL        125 mg/dL                   284-132    HDL CHOLESTEROL      [L]  29 mg/dL                    > OR = 40   LDL-CHOLESTEROL (calc)                              71 mg/dL                    <440             DESIRABLE RANGE <100 MG/DL FOR PATIENTS WITH CHD OR      DIABETES AND <70 MG/DL FOR DIABETIC PATIENTS WITH      KNOWN HEART DISEASE.          CHOL/HDLC RATIO (calc)                              4.3                         < OR = 5.0    Note: An exclamation mark (!) indicates a result that was not dispersed into   the flowsheet.  Document Creation Date: 03/13/2006 11:23 PM  _______________________________________________________________________    (1) Order result status: Final  Collection or observation date-time: 03/13/2006 15:24  Requested date-time:   Receipt date-time: 03/13/2006 20:19  Reported date-time: 03/13/2006 23:00  Referring Physician:    Ordering Physician: [name] [facility])  Specimen Source: S  Source: Arline Asp Order Number: [id number]  Lab site: [facility] [place]      [address]      [place]  [place]  [address]

## 2006-03-13 NOTE — Unmapped (Signed)
Signed by [name] MD on 03/14/2006 at 06:49:19  Patient: [name]  Note: All result statuses are Final unless otherwise noted.    Tests: (1) COMPREHENSIVE METABOLIC PANEL W/EGFR (QDL-10231)    GLUCOSE                   97 mg/dL                    69-62                  FASTING REFERENCE INTERVAL    UREA NITROGEN (BUN)       22 mg/dL                    9-52    CREATININE                1.1 mg/dL                   0.50-1.30   EGFR NON-AFR. AMERICAN                              >60 mL/min/1.16m2           > OR = 60  ! EGFR AFRICAN AMERICAN                              >60 mL/min/1.2m2           > OR = 60   BUN/CREATININE RATIO (calc)                              20                          6-22    SODIUM                    140 mmol/L                  135-146    POTASSIUM                 4.2 mmol/L                  3.5-5.3    CHLORIDE                  104 mmol/L                  98-110    CARBON DIOXIDE            27 mmol/L                   21-33    CALCIUM                   8.9 mg/dL                   8.4-13.2    PROTEIN, TOTAL            6.5 g/dL                    4.4-0.1    ALBUMIN                   4.1 g/dL  3.6-5.1    GLOBULIN (calc)           2.4 g/dL                    1.4-7.8   ALBUMIN/GLOBULIN RATIO (calc)                              1.7                         1.0-2.1    BILIRUBIN, TOTAL          0.6 mg/dL                   2.9-5.6    ALKALINE PHOSPHATASE      65 U/L                      40-115    AST                       14 U/L                      10-35    ALT                       17 U/L                      9-60            REPORT COMMENT:      FASTING    Note: An exclamation mark (!) indicates a result that was not dispersed into   the flowsheet.  Document Creation Date: 03/13/2006 11:23 PM  _______________________________________________________________________    (1) Order result status: Final  Collection or observation date-time: 03/13/2006 15:24  Requested  date-time:   Receipt date-time: 03/13/2006 20:19  Reported date-time: 03/13/2006 23:00  Referring Physician:    Ordering Physician: [name] [facility])  Specimen Source: S  Source: Arline Asp Order Number: [id number]  Lab site: [facility] [place]      [address]      [place]  [place]  [address]

## 2006-03-13 NOTE — Unmapped (Signed)
Signed by [name] MD on 03/14/2006 at 06:49:49  Patient: [name]  Note: All result statuses are Final unless otherwise noted.    Tests: (1) URINALYSIS, REFLEX (QDL-7909)    COLOR                     YELLOW                      YELLOW    APPEARANCE                CLEAR                       CLEAR    SPECIFIC GRAVITY          1.020                       1.001-1.035    PH                        6.5                         5.0-8.0    GLUCOSE                   NEGATIVE                    NEGATIVE    BILIRUBIN                 NEGATIVE                    NEGATIVE    KETONES                   NEGATIVE                    NEGATIVE    OCCULT BLOOD              NEGATIVE                    NEGATIVE    PROTEIN                   NEGATIVE                    NEGATIVE    NITRITE                   NEGATIVE                    NEGATIVE    LEUKOCYTE ESTERASE        NEGATIVE                    NEGATIVE            REPORT COMMENT:      FASTING    Note: An exclamation mark (!) indicates a result that was not dispersed into   the flowsheet.  Document Creation Date: 03/13/2006 10:52 PM  _______________________________________________________________________    (1) Order result status: Final  Collection or observation date-time: 03/13/2006 15:24  Requested date-time:   Receipt date-time: 03/13/2006 20:19  Reported date-time: 03/13/2006 22:00  Referring Physician:    Ordering Physician: [name] [facility])  Specimen Source: R  Source: Arline Asp Order Number: [id number]  Lab  site: [facility] [place]      [address]      [place]  [place]  [address]      -----------------    The following non-numeric lab results were dispersed to  the flowsheet even though numeric results were expected:      GLUCOSE, NEGATIVE

## 2006-03-13 NOTE — Unmapped (Signed)
Signed by [name] MD on 03/13/2006 at 18:53:09  Patient: [name]  Note: All result statuses are Final unless otherwise noted.    Tests: (1) PROTHROMBIN TIME WITH INR (ZOX-0960)   INTERNATIONAL NORMALIZED RATIO (INR)                              3.1                INR REFERENCE INTERVAL APPLIES TO PATIENTS                NOT ON ANTICOAGULANT THERAPY:                    0.9- 1.1                       SUGGESTED INR THERAPEUTIC RANGE FOR ORAL                ANTICOAGULANT THERAPY (STABLY ANTICOAGULATED                PATIENTS)                                 ROUTINE THERAPY:                       2.0- 3.0                                 RECURRENT MYOCARDIAL INFARCTION                          OR MECHANICAL PROSTHETIC VALVES:       2.5- 3.5                  PROTHROMBIN TIME     [H]  29.8 sec                    9.0-11.5    Note: An exclamation mark (!) indicates a result that was not dispersed into   the flowsheet.  Document Creation Date: 03/13/2006 6:50 PM  _______________________________________________________________________    (1) Order result status: Final  Collection or observation date-time: 03/13/2006 15:18  Requested date-time:   Receipt date-time: 03/13/2006 17:56  Reported date-time: 03/13/2006 18:00  Referring Physician:    Ordering Physician: [name] [facility])  Specimen Source: B  Source: Arline Asp Order Number: [id number]  Lab site: [facility] [place]      [address]      [place]  [place]  [address]

## 2006-03-13 NOTE — Unmapped (Signed)
Signed by [name] MD on 03/14/2006 at 06:49:19  Patient: [name]  Note: All result statuses are Final unless otherwise noted.    Tests: (1) MICROALBUMIN, RANDOM URINE (W/CREATININE) (QDL-6517)   CREATININE, RANDOM URINE                              124 mg/dL                   64-332    MICROALBUMIN              0.7 mg/dL      REFERENCE RANGE      NOT ESTABLISHED     MICROALBUMIN/CREATININE RATIO, RANDOM URINE                              6 mcg/mg creat              <30             THE ADA (DIABETES CARE 26:S94-S98, 2003) DEFINES      ABNORMALITIES IN ALBUMIN EXCRETION AS FOLLOWS:             CATEGORY         RESULT (MCG/MG CREATININE)             NORMAL                    <30      MICROALBUMINURIA         30-299       CLINICAL ALBUMINURIA   > OR = 300             THE ADA RECOMMENDS THAT AT LEAST TWO OF THREE      SPECIMENS COLLECTED WITHIN A 3-6 MONTH PERIOD BE      ABNORMAL BEFORE CONSIDERING A PATIENT TO BE      WITHIN A DIAGNOSTIC CATEGORY.            REPORT COMMENT:      FASTING    Note: An exclamation mark (!) indicates a result that was not dispersed into   the flowsheet.  Document Creation Date: 03/14/2006 4:26 AM  _______________________________________________________________________    (1) Order result status: Final  Collection or observation date-time: 03/13/2006 15:24  Requested date-time:   Receipt date-time: 03/13/2006 20:19  Reported date-time: 03/14/2006 04:00  Referring Physician:    Ordering Physician: [name] [facility])  Specimen Source: R  Source: Arline Asp Order Number: [id number]  Lab site: [facility] [place]      [address]      [place]  [facility]  [address]      -----------------    The following lab values were dispersed to the flowsheet  with no units conversion:      CREATININE, RANDOM URINE, 124 MG/DL, (F)  expected units: mL/min    MICROALBUMIN/CREATININE RATIO, RANDOM URINE, 6 MCG/MG CREAT, (F)  expected   units: mg/g

## 2006-03-13 NOTE — Unmapped (Signed)
Signed by Sheryle Spray MA on 03/14/2006 at 18:11:54      Follow-up for Test Results:   Comments: Continue coumadin and recheck in one month.  Follow-up by: [name] MD,  March 13, 2006 6:54 PM    Additional Follow-up for Test Results:   Comments: left message to call  Additional Follow-up by: Sheryle Spray MA,  March 14, 2006 11:04 AM    Additional Follow-up for Test Results:   Comments: left message with instructions on machine  Additional Follow-up by: Sheryle Spray MA,  March 14, 2006 6:11 PM

## 2006-03-13 NOTE — Unmapped (Signed)
Signed by [name] MD on 03/14/2006 at 06:49:19  Patient: [name]  Note: All result statuses are Final unless otherwise noted.    Tests: (1) TSH, 3RD GENERATION (QDL-899)    TSH, 3RD GENERATION  [L]  0.01 mIU/L                  0.40-4.50            REPORT COMMENT:      FASTING    Note: An exclamation mark (!) indicates a result that was not dispersed into   the flowsheet.  Document Creation Date: 03/14/2006 1:40 AM  _______________________________________________________________________    (1) Order result status: Final  Collection or observation date-time: 03/13/2006 15:24  Requested date-time:   Receipt date-time: 03/13/2006 20:19  Reported date-time: 03/14/2006 01:00  Referring Physician:    Ordering Physician: [name] [facility])  Specimen Source: S  Source: Arline Asp Order Number: [id number]  Lab site: [facility] [place]      [address]      [place]  [place]  [address]

## 2006-03-13 NOTE — Unmapped (Signed)
Signed by [name] MD on 03/14/2006 at 06:49:19  Patient: [name]  Note: All result statuses are Final unless otherwise noted.    Tests: (1) T-4, FREE (QDL-866)    T-4, FREE                 1.7 ng/dL                   1.6-1.0    Note: An exclamation mark (!) indicates a result that was not dispersed into   the flowsheet.  Document Creation Date: 03/14/2006 1:40 AM  _______________________________________________________________________    (1) Order result status: Final  Collection or observation date-time: 03/13/2006 15:24  Requested date-time:   Receipt date-time: 03/13/2006 20:19  Reported date-time: 03/14/2006 01:00  Referring Physician:    Ordering Physician: [name] [facility])  Specimen Source: S  Source: Arline Asp Order Number: [id number]  Lab site: [facility] [place]      [address]      [place]  [place]  [address]

## 2006-03-13 NOTE — Unmapped (Addendum)
Signed by [name] MD on 03/13/2006 at 14:50:09      Reason for Visit   Chief Complaint: CPE    History from: patient    Allergies  No Known Allergies    Medications   COUMADIN 5 MG TABS (WARFARIN SODIUM) 1 (5 mg) each day except 1.5 (7.5 mg) a day on Tuesdays and Fridays  VYTORIN 10-20 MG TABS (EZETIMIBE-SIMVASTATIN) one po daily  HYZAAR 50-12.5 MG TABS (LOSARTAN POTASSIUM-HCTZ) one po daily  COREG 12.5 MG TABS (CARVEDILOL) Take one twice a day  GLYBURIDE-METFORMIN 2.5-500 MG TABS (GLYBURIDE-METFORMIN) Take one daily  LEVOTHROID 125 MCG TABS (LEVOTHYROXINE SODIUM) Take on by mouth daily  PROTONIX 40 MG TBEC (PANTOPRAZOLE SODIUM) Take one daily       Vital Signs:   Wt: 262 lbs.      Wt chg (lbs): 102  Temperature: 98.1  degrees F  oral  Pulse: 72 (regular)  Resp: 16  BP: 128/86  Urine Dip: Done     Intake recorded by: Tama Headings MA on March 13, 2006 2:10 PM        HPI Multiple Chronic   Condition(s): Atrial Fibrillation, Diabetes, GERD/Reflux, Hyperlipidemia.  Comments: off the pacerone    Current Status:     Home Glucose Monitoring: yes  Frequency: BID  High: 140 Low: 108   ROS/Symptoms:   Patient complains of the following endocrine symptoms: does mostly 2 hours after eating breakfast  Patient denies any endocrine symptoms.  Patient denies any cardiovascular symptoms.  Patient complains of the following respiratory symptoms: does not recallhaving a cough with an ACE  Patient denies any respiratory symptoms.  Patient complains of the following GI symptoms: heartburn is well controlled on the protonix =- just had esophagus dilated  Patient denies any GI symptoms.  Patient denies any neurologic symptoms.  Patient complains of the following GU symptoms: using saw palmetto -- urine flow is better -- on this for about a year!  Had ED before [facility] -- no change.  Viagra helped in past.  Patient complains of the following musculoskeletal symptoms: left thumb hurts at the mc base, sees a podiatrist for  right foot, getting mold done for orthotic -- glucosamine helps  Patient denies any psychological symptoms.          Physical Examination:   BP: 128/  86    Physical Exam- Detail:   General Appearance: OBESE IN NAD  Skin: Normal. No rashes or lesions.  Eyes: Sclera white, conjunctiva without injection and pallor.  PERRLA.  EOMI  Ears: No lesions.  Tympanic membranes translucent, non-bulging.  Canal walls pink, without discharge.  Hearing grossly intact.  Nose/Face: Mucosa and turbinates pink, septum midline. No polyps, no discharge, no lesions.  Oropharynx: Normal appearance.  No erythema,exudate, or mass. No tonsillar swelling.  Oral Cavity: Gums pink, good dentition.  Oral mucosa and tongue without lesions.  Respiratory: Respiration un-labored.  Lung fields clear to auscultation.  No wheezing, rales, rhonchi or pleural rub.  Neck: Full ROM.  No thyromegaly, no nodules, masses, or tenderness.   Lymphatic: Areas palpated not enlarged:  cervical, supraclavicular.  Chest: No masses, lesions, or tenderness.  Cardiac: irr irr  Vascular: No carotid bruits.  No edema or varicosities.  Pedal Pulse Left: dorsalis pedis +2, posterior tib +2  Pedal Pulse Right: dorsalis pedis +2,  posterior tib +2  Abdomen: No masses or tenderness. Bowel sounds active x4 quad.  Liver and spleen are without tenderness or enlargement.  No hernias.  Rectal Exam: Normal sphincter tone, no masses.   Hemoccult: Guiac negative  Male G/U: Scrotum without tenderness, swelling or masses.  Testicles without masses.  No penile discharge.  Circumsized: no  Prostate Exam: Prostate non-enlarged, symmetrical, without nodularity or tenderness.  Neurologic: Cranial nerves 2 through 12 intact.  Deep tendon reflexes 2+ bilaterally.  Sensation intact.  No spasticity.  Strength is 5/5 in upper and lower extremities bilaterally.  Psychiatric: Judgement and insight are within normal limits.  Alert and oriented x3.  No mood disorders noted, appropriate  affect.  Musculoskeletal: Gait coordinated and smooth.  Digits are without clubbing or cyanosis.  Cervical Spine No misalignment, defects, or erythema.  No tenderness, crepitus or subluxation. Full ROM.  Thoracic Spine: No misalignment, defects, or erythema.  No tenderness, crepitus or subluxation. Full ROM.  Lumbar Spine: No misalignment, defects, or erythema.  No tenderness, crepitus or subluxation. Full ROM.  Right Upper Extremity: No misalignment, defects, or erythema.  No tenderness, crepitus or subluxation. No synovial thickening or nodularity. Full ROM.  Left Upper Extremity: crepitus  base of left thumb mc  Right Lower Extremity: No misalignment, defects, or erythema.  No tenderness, crepitus or subluxation. No synovial thickening or nodularity. Full ROM.  Left Lower Extremity: No misalignment, defects, or erythema.  No tenderness, crepitus or subluxation. No synovial thickening or nodularity. Full ROM.      In office Procedures & Tests   Date/Time Collected: 03/13/06    Routine Urinalysis     Physical characteristics   Color: yellow  Appearance: clear    Chemical measurements   Glucose (mg/dL): negative  Bilirubin: +  Ketone (mg/dL): negative  Spec. Gravity: 1.010  Blood: non-hemolyzed trace  pH: 6.5  Protein (mg/dL): negative  Urobilinogen (mg/dL): normal  Nitrite: negative  Leukocytes: negative    Other tests:     Stool - Occult Blood    #1: Guiac negative      New Problems:  HYPOTHYROIDISM NOS (ICD-244.9)  DIABETES MELLITUS (ICD-250.00)  SCREENING FOR MALIGNANT NEOPLASM, PROSTATE (ICD-V76.44)  New Medications:  LISINOPRIL-HYDROCHLOROTHIAZIDE 20-12.5 MG TABS (LISINOPRIL-HYDROCHLOROTHIAZIDE) 1 by mouth daily  COREG 12.5 MG TABS (CARVEDILOL) Take one twice a day  GLYBURIDE-METFORMIN 2.5-500 MG TABS (GLYBURIDE-METFORMIN) Take one daily  LEVOTHROID 125 MCG TABS (LEVOTHYROXINE SODIUM) Take on by mouth daily  PROTONIX 40 MG TBEC (PANTOPRAZOLE SODIUM) Take one daily  SAW PALMETTO EXTR (SAW PALMETTO) 1 by mouth  daily  GLUCOSAMINE SULFATE POWD (GLUCOSAMINE SULFATE)       Preventive Maintenance        Assessment and Plan  Status of Existing Problems:  Assessed DIABETES MELLITUS as unchanged - check a1c, urine - [name] MD  Assessed SCREENING FOR MALIGNANT NEOPLASM, PROSTATE as unchanged - [name] MD  Assessed HYPERTENSION as improved - [name] MD  Assessed HYPERLIPIDEMIA as unchanged - check lab - [name] MD  Assessed ATRIAL FIBRILLATION as unchanged - check pt - [name] MD  Assessed CHF as unchanged - doing well  -- concerned re price some meds -- change to ace and watch from hyzaar - [name] MD  New Problems:  Dx of HYPOTHYROIDISM NOS (ICD-244.9)   Dx of DIABETES MELLITUS (ICD-250.00)   Dx of SCREENING FOR MALIGNANT NEOPLASM, PROSTATE (ICD-V76.44)     Medications   New Prescriptions/Refills:  PROTONIX 40 MG TBEC (PANTOPRAZOLE SODIUM) Take one daily  #90 x 3 : [name] MD (03/13/2006)  LISINOPRIL-HYDROCHLOROTHIAZIDE 20-12.5 MG TABS (LISINOPRIL-HYDROCHLOROTHIAZIDE) 1 by mouth daily  #90  x 3 : [name] MD (03/13/2006)    New medications:  LISINOPRIL-HYDROCHLOROTHIAZIDE 20-12.5 MG TABS -- 1 by mouth daily  Start date: 03/13/2006  COREG 12.5 MG TABS -- Take one twice a day  GLYBURIDE-METFORMIN 2.5-500 MG TABS -- Take one daily  LEVOTHROID 125 MCG TABS -- Take on by mouth daily  PROTONIX 40 MG TBEC -- Take one daily  SAW PALMETTO EXTR -- 1 by mouth daily  Start date: 03/13/2006  GLUCOSAMINE SULFATE POWD  Start date: 03/13/2006      Today's Orders   99214 - Ofc Vst, Est Level IV [CPT-99214]  Prothrombin Time   (PT) (8847) [CPT-85610]  TSH   (TSH) (899) [CPT-84443]  T4 free  (FT4) (866) [CPT-84439]  CMP   (METAPNL) (10231) [CPT-80053]  Lipid Profile   (FATS) (7600) [CPT-80061]  Hemoglobin  A1C   (GLYCO) (496) [CPT-83036]  Urine Microalbumin w CR, (UMALB) (6517) [CPT-82043]  Urinalysis   (UA) (7909) [CPT-81000]    Disposition:   Return to clinic for Doctor Visit in 1 month(s)    Appointment Reason: fasting            Prescriptions:  PROTONIX 40 MG TBEC (PANTOPRAZOLE SODIUM) Take one daily  #90 x 3   Entered and Authorized by: [name] MD   Signed by: [name] MD on 03/13/2006   Method used: Print then Give to Patient  LISINOPRIL-HYDROCHLOROTHIAZIDE 20-12.5 MG TABS (LISINOPRIL-HYDROCHLOROTHIAZIDE) 1 by mouth daily  #90 x 3   Entered and Authorized by: [name] MD   Signed by: [name] MD on 03/13/2006   Method used: Print then Give to Patient              Signed by [name] MD on 03/17/2006 at 17:44:47              Allergies  No Known Allergies    Medications                       Preventive Maintenance          Today's Orders   HgA1C  -  >= 12mos. care  < 7% [CPT-3044F]  LDL  -  >= 12mos. care  < 100 [CPT-3048F]  BP Systolic  -  >= 12mos. care  < 130 [CPT-3074F]  BP Diastolic  -  >= 12mos. care  80-89 [CPT-3079F]

## 2006-03-14 NOTE — Unmapped (Signed)
Signed by Bary Richard MA on 03/14/2006 at 09:59:18    Prescriptions:  LEVOTHYROXINE SODIUM 100 MCG TABS (LEVOTHYROXINE SODIUM) 1 by mouth daily  #90 x 3   Entered by: Bary Richard MA   Authorized by: [name] MD   Signed by: Bary Richard MA on 03/14/2006   Method used: Print then Give to Patient

## 2006-03-14 NOTE — Unmapped (Signed)
Signed by [name] MD on 03/14/2006 at 06:52:49            [name], MD  [address] [place], [place] [address]  Phone: [phone]  Fax: [phone]      March 14, 2006      [name]  [address] [place], [place] [address]                                                                                           RE:  TEST RESULTS   [name]--1940-12-19)         Dear Mr. GARTMAN:      The following is an interpretation of your most recent tests.  Please take note of any instructions provided.    Electrolyte studies:  Normal, Blood sugar normal         Glucose test normal:  97   Kidney function studies: :  Normal    Liver Function Studies:  Normal    Lipid Panel:  Good          Triglyceride: 127   Cholesterol: 125   LDL: 71   HDL: 29   Chol/HDL%:  4.3   Thyroid Studies:  Decrease thyroid medication (see enclosed Rx)        TSH: 0.01      HgbA1c:  Good          HgbA1c: 6.8      Microalbumin:  Good        Microalbumin: 0.7        Microalbumin/Creatinine Ratio: 6 MCG/MG CREAT        Additional Comments: In most cases, we like to see the total cholesterol under 200 and triglyceride level under 150.  Slightly higher may be okay under some circumstances.  We would like the good cholesterol (HDL) to be over 40 and at least one-fifth of the total cholesterol.  Ideally, we want the bad cholesterol (LDL) to be under 130.  For anybody who has had angina or who has known coronary artery problems or has diabetes, we would like to see the LDL under 100, preferably under 70 if possible.    Your cholesterol profile looks good.  Please continue your current cholesterol medication and check another blood test in 4 to 6 months.    Your hemoglobin A1c shows that your glucose control has been good for the last 3 months.  Please continue your current treatment and check another hemoglobin A1c in 4 to 6 months.    Your thyroid dose looks too high.  As you already know, the Pacerone which you used to  take can suppress the thyroid gland and make someone look more low thyroid than they should have.  Please use the enclosed thyroid prescription, reduce your dose, and see me back in 3 months to recheck this.     Medications Prescribed or Changed  LEVOTHYROXINE SODIUM 100 MCG TABS (LEVOTHYROXINE SODIUM) 1 by mouth daily          Keep up the good work,        [employer]  MD

## 2006-03-27 NOTE — Unmapped (Signed)
Signed by [name] MD on 03/27/2006 at 00:00:00  [facility]      Imported By: Deon Pilling 05/05/2006 07:56:07    _____________________________________________________________________    External Attachment:    Please see [employer] EMR for this document.

## 2006-03-27 NOTE — Unmapped (Signed)
Signed by [name] MD on 03/27/2006 at 00:00:00  [facility]      Imported By: Uvaldo Bristle 08/14/2006 15:39:41    _____________________________________________________________________    External Attachment:    Please see [employer] EMR for this document.

## 2006-04-03 NOTE — Unmapped (Signed)
Signed by Bary Richard MA on 04/03/2006 at 14:08:56    Phone Note   Call from Pharmacy    Pharmacy Name: [facility]  Pharmacy Phone Number: [phone]  Pharmacy Fax Number: [phone]  Summary of call: refill on one touch ultra test strips and lancets...testing twice daily.. new script  Initial call taken by: Rolin Barry,  April 03, 2006 11:03 AM      Follow-up for Phone Call   RX CALLED IN  Follow-up by: Bary Richard MA,  April 03, 2006 2:08 PM

## 2006-05-01 LAB — PROTIME-INR
INR: 3.4
Protime: 32.7 s (ref 9.0–11.5)

## 2006-05-01 NOTE — Unmapped (Signed)
Signed by [name] MD on 05/01/2006 at 15:19:57  Patient: [name]  Note: All result statuses are Final unless otherwise noted.    Tests: (1) PROTHROMBIN TIME WITH INR (QMV-7846)   INTERNATIONAL NORMALIZED RATIO (INR)                              3.4                INR REFERENCE INTERVAL APPLIES TO PATIENTS                NOT ON ANTICOAGULANT THERAPY:                    0.9- 1.1                       SUGGESTED INR THERAPEUTIC RANGE FOR ORAL                ANTICOAGULANT THERAPY (STABLY ANTICOAGULATED                PATIENTS)                                 ROUTINE THERAPY:                       2.0- 3.0                                 RECURRENT MYOCARDIAL INFARCTION                          OR MECHANICAL PROSTHETIC VALVES:       2.5- 3.5                  PROTHROMBIN TIME     [H]  32.7 sec                    9.0-11.5    Note: An exclamation mark (!) indicates a result that was not dispersed into   the flowsheet.  Document Creation Date: 05/01/2006 3:13 PM  _______________________________________________________________________    (1) Order result status: Final  Collection or observation date-time: 05/01/2006 10:34  Requested date-time:   Receipt date-time: 05/01/2006 13:00  Reported date-time: 05/01/2006 14:00  Referring Physician:    Ordering Physician: [name] [facility])  Specimen Source: B  Source: Arline Asp Order Number: [id number] [phone]  Lab site: [facility] [place]      [address]      [place]  [place]  [id number]

## 2006-05-01 NOTE — Unmapped (Signed)
Signed by Tora Kindred MA on 05/01/2006 at 17:08:38      Coumadin Management     Patient PT: 32.7 (05/01/2006 10:34:00 AM)  INR: 3.4 (05/01/2006 10:34:00 AM)  Target INR Range: 2.0-3.0  Duration Tx: indefinitely  Indication for Coumadin Therapy: Atrial Fibrillation  Current Coumadin Regimen 1.5 (7.5 mg) each day except 1 (5 mg) a day on Tuesdays and Fridays  New Coumadin Regimen 1 (5 mg) each day except 1.5 (7.5 mg) a day on Fridays  Next Protime in: 2 weeks    Follow-up for Test Results:   Comments: BP noted -- recheck with next protime 2 weeks.  Action taken: PT/INR is too high and the dose of coumadin needs decreased.  Follow-up by: [name] MD,  May 01, 2006 3:20 PM    New Medications:  COUMADIN 5 MG TABS (WARFARIN SODIUM) 1 (5 mg) each day except 1.5 (7.5 mg) a day on Fridays    Additional Follow-up for Test Results:   Instructions given to: Patient  Action Taken: The patient/family/other verbalizes a clear understanding of the above plan.  Comments: talked with patient   Additional Follow-up by: Tora Kindred MA,  May 01, 2006 5:08 PM

## 2006-05-01 NOTE — Unmapped (Signed)
Signed by Sheryle Spray MA on 05/01/2006 at 10:30:20      Coumadin Management Preload   Indication for Coumadin Therapy: Atrial Fibrillation    Vital Signs:     Pulse: 72 (regular)    Patient appears to be in acute distress: no    Blood Pressure #1: 150/90  Blood Pressure #2: 148/98    Relative Factors:   Any missed doses: no  Any new meds/antibiotics: no  Any dietary changes: no  Any bleeding problems: no  Comments: pt instructed to take b/p and call if it remains elevated.      Previous values:   Date of Test: 03/13/2006  Last PT:   29.8  Last INR:   3.1  Target INR:   2.0-3.0  Treatment Duration: indefinitely  Current Coumadin Regimen 1.5 (7.5 mg) each day except 1 (5 mg) a day on Tuesdays and Fridays  New Coumadin Regimen 5mg  sun-mon-wed-thurs-sat  7.5mg  tues & Fri  Recorded by: Sheryle Spray MA on May 01, 2006 10:21 AM

## 2006-07-07 NOTE — Unmapped (Signed)
Signed by Tora Kindred MA on 07/07/2006 at 11:41:29    Phone Note   Call from Pharmacy    Pharmacy Name: [facility]  Caller: Fax  Pharmacy Phone Number: [phone]  Call for: [facility]  Reason for Call: needs renewal  Summary of call: RF Carvedilol 12.5mg   #180    Initial call taken by: Barnetta Hammersmith,  July 07, 2006 10:09 AM      Follow-up for Phone Call   ok  Follow-up by: [name] MD,  July 07, 2006 11:26 AM    Additional Follow-up for Phone Call   rx called in  phone call completed  Additional Follow-up by: Tora Kindred MA,  July 07, 2006 11:41 AM    Prescriptions:  COREG 12.5 MG TABS (CARVEDILOL) Take one twice a day  #180 x 3   Entered and Authorized by: [name] MD   Signed by: [name] MD on 07/07/2006   Method used: Telephoned to .Marland KitchenMarland Kitchen

## 2006-07-10 NOTE — Unmapped (Signed)
Signed by Tora Kindred MA on 07/11/2006 at 13:34:30    Phone Note   Patient Call  Call back at Home Phone: [phone]  Caller: patient  Call for: [name]    Pharmacy Information: [phone] fax number   Summary of Call: pt needs a refill on  vytorin and protonix  can you fax rx to [phone] please let pt known      Initial call taken by: A Blanchard Kelch,  July 10, 2006 11:17 AM      Follow-up for Phone Call   faxed rx   jf  phone call completed  Follow-up by: Tora Kindred MA,  July 11, 2006 1:34 PM    Prescriptions:  PROTONIX 40 MG TBEC (PANTOPRAZOLE SODIUM) Take one daily  #90 x 1   Entered by: Tora Kindred MA   Authorized by: [name] MD   Signed by: Tora Kindred MA on 07/11/2006   Method used: Reprint   RxID: [id number]  VYTORIN 10-20 MG TABS (EZETIMIBE-SIMVASTATIN) one po daily  #90 x 1   Entered by: Tora Kindred MA   Authorized by: [name] MD   Signed by: Tora Kindred MA on 07/11/2006   Method used: Reprint   RxID: [id number]  PROTONIX 40 MG TBEC (PANTOPRAZOLE SODIUM) Take one daily  #90 x 1   Entered and Authorized by: [name] MD   Signed by: [name] MD on 07/10/2006   Method used: Print then Give to Patient   RxID: [id number]  VYTORIN 10-20 MG TABS (EZETIMIBE-SIMVASTATIN) one po daily  #90 x 1   Entered and Authorized by: [name] MD   Signed by: [name] MD on 07/10/2006   Method used: Print then Give to Patient   RxID: [id number]

## 2006-07-14 LAB — OFFICE VISIT LAB RESULTS
INR: 3.4
Protime: 32.7 s

## 2006-07-14 LAB — PROTIME-INR
INR: 2.6
Protime: 24.8 s (ref 9.0–11.5)

## 2006-07-14 NOTE — Unmapped (Signed)
Signed by [name] MD on 07/14/2006 at 13:20:07  Patient: [name]  Note: All result statuses are Final unless otherwise noted.    Tests: (1) PROTHROMBIN TIME WITH INR (EXB-2841)   INTERNATIONAL NORMALIZED RATIO (INR)                              2.6                INR REFERENCE INTERVAL APPLIES TO PATIENTS                NOT ON ANTICOAGULANT THERAPY:                    0.9- 1.1                       SUGGESTED INR THERAPEUTIC RANGE FOR ORAL                ANTICOAGULANT THERAPY (STABLY ANTICOAGULATED                PATIENTS)                                 ROUTINE THERAPY:                       2.0- 3.0                                 RECURRENT MYOCARDIAL INFARCTION                          OR MECHANICAL PROSTHETIC VALVES:       2.5- 3.5                  PROTHROMBIN TIME     [H]  24.8 sec                    9.0-11.5    Note: An exclamation mark (!) indicates a result that was not dispersed into   the flowsheet.  Document Creation Date: 07/14/2006 1:16 PM  _______________________________________________________________________    (1) Order result status: Final  Collection or observation date-time: 07/14/2006 09:57  Requested date-time:   Receipt date-time: 07/14/2006 11:42  Reported date-time: 07/14/2006 12:00  Referring Physician:    Ordering Physician: [name] [facility])  Specimen Source: B  Source: Arline Asp Order Number: [id number] [phone]  Lab site: [facility] [place]      [address]      [place]    [id number]

## 2006-07-14 NOTE — Unmapped (Signed)
Signed by Tora Kindred MA on 07/14/2006 at 10:04:53      Coumadin Management Preload   Indication for Coumadin Therapy: Atrial Fibrillation    Vital Signs:     Pulse: 86  BP: 132/88    Relative Factors:   Any missed doses: no  Any new meds/antibiotics: no  Any dietary changes: no  Any bleeding problems: no    Previous values:   Date of Test: 05/01/2006  Last PT:   32.7  Last INR:   3.4  Target INR:   2.0-3.0  Treatment Duration: indefinitely  Current Coumadin Regimen 1 (5 mg) each day except 1.5 (7.5 mg) a day on Fridays

## 2006-07-14 NOTE — Unmapped (Signed)
Signed by Tora Kindred MA on 07/14/2006 at 13:41:23      Coumadin Management     Patient PT: 24.8 (07/14/2006 9:57:00 AM)  INR: 2.6 (07/14/2006 9:57:00 AM)  Target INR Range: 2.0-3.0  Duration Tx: indefinitely  Indication for Coumadin Therapy: Atrial Fibrillation  Current Coumadin Regimen 1 (5 mg) each day except 1.5 (7.5 mg) a day on Fridays  New Coumadin Regimen 1 (5 mg) each day except 1.5 (7.5 mg) a day on Fridays  Next Protime in: 1 month    Follow-up for Test Results:   Action taken: PT/INR is OK.  No change in dosage of coumadin.  Follow-up by: [name] MD,  July 14, 2006 1:20 PM      Additional Follow-up for Test Results:   Action Taken: The patient/family/other verbalizes a clear understanding of the above plan.  Comments: left message for patient  Additional Follow-up by: Tora Kindred MA,  July 14, 2006 1:40 PM

## 2006-07-27 NOTE — Unmapped (Signed)
Signed by Tora Kindred MA on 07/27/2006 at 14:44:50    Phone Note   Call from Pharmacy    [facility]  Caller: fax  Pharmacy Phone Number: [phone]  Call for: Dr. Newman Pies  Summary of call: carvedilol tab 12.5 mg  Pacerone tab 200mg     Initial call taken by: Deloris Ping,  July 27, 2006 8:51 AM      Follow-up for Phone Call   He has a coreg (carvedilol) rx.  We haven't been doing his pacerone -- I don't know the current dose and directions.  He has been getting it from his cardiologist.  Get dose and directions for our records.  Follow-up by: [name] MD,  July 27, 2006 10:05 AM    Additional Follow-up for Phone Call   left message to have patient call  phone call completed  Additional Follow-up by: Tora Kindred MA,  July 27, 2006 2:44 PM

## 2006-07-28 NOTE — Unmapped (Signed)
Signed by Tora Kindred MA on 07/28/2006 at 17:31:03    Phone Note   Patient Call    Summary of Call: patient wants mail away scripts for coreg and he is on pacerone 200mg  and he takes one and  1/2 tablet daily.  he wants to mail these away   fax  [phone]     Initial call taken by: Tora Kindred MA,  July 28, 2006 1:25 PM      New Medications:  PACERONE 200 MG TABS (AMIODARONE HCL) one and a half by mouth daily    Follow-up for Phone Call   fax to [phone]  phone call completed  Follow-up by: Tora Kindred MA,  July 28, 2006 5:31 PM    Prescriptions:  COREG 12.5 MG TABS (CARVEDILOL) Take one twice a day  #180 x 3   Entered and Authorized by: [name] MD   Signed by: [name] MD on 07/28/2006   Method used: Print then Give to Patient   RxID: [id number]  PACERONE 200 MG TABS (AMIODARONE HCL) one and a half by mouth daily  #135 x 3   Entered and Authorized by: [name] MD   Signed by: [name] MD on 07/28/2006   Method used: Print then Give to Patient   RxID: [id number]

## 2006-08-18 LAB — PROTIME-INR
INR: 3.3
Protime: 31.1 s (ref 9.0–11.5)

## 2006-08-18 LAB — OFFICE VISIT LAB RESULTS
INR: 2.6
INR: 2.6
Protime: 24.8 s
Protime: 24.8 s

## 2006-08-18 NOTE — Unmapped (Signed)
Signed by Tora Kindred MA on 08/18/2006 at 11:10:38    Phone Note   Outgoing Call    Call placed by: Tora Kindred MA,  Aug 18, 2006 10:56 AM  Summary of call: need order for pro-time      New Orders:  Prothrombin Time   (PT) (1610) [CPT-85610]  Follow-up for Phone Call   done jf  Follow-up by: Tora Kindred MA,  Aug 18, 2006 11:10 AM

## 2006-08-18 NOTE — Unmapped (Signed)
Signed by Tora Kindred MA on 08/18/2006 at 17:01:08      Coumadin Management     Patient PT: 31.1 (08/18/2006 10:43:00 AM)  INR: 3.3 (08/18/2006 10:43:00 AM)  Target INR Range: 2.0-3.0  Duration Tx: indefinitely  Indication for Coumadin Therapy: Atrial Fibrillation  Current Coumadin Regimen 1 (5 mg) each day except 1.5 (7.5 mg) a day on Fridays  New Coumadin Regimen one by mouth daily   Next Protime in: 1 month    Follow-up for Test Results:   Action taken: PT/INR is too high and the dose of coumadin needs decreased.  Follow-up by: [name] MD,  Aug 18, 2006 3:36 PM    New Medications:  COUMADIN 5 MG TABS (WARFARIN SODIUM) one by mouth daily    Additional Follow-up for Test Results:   Instructions given to: Patient  Action Taken: The patient/family/other verbalizes a clear understanding of the above plan.  Comments: called patient  Additional Follow-up by: Tora Kindred MA,  Aug 18, 2006 5:01 PM

## 2006-08-18 NOTE — Unmapped (Signed)
Signed by Tora Kindred MA on 08/18/2006 at 10:01:38      Coumadin Management Preload   Indication for Coumadin Therapy: Atrial Fibrillation    Vital Signs:     Pulse: 80  BP: 130/82    Relative Factors:   Any missed doses: no  Any new meds/antibiotics: no  Any dietary changes: no  Any bleeding problems: no    Previous values:   Date of Test: 07/14/2006  Last PT:   24.8  Last INR:   2.6  Target INR:   2.0-3.0  Treatment Duration: indefinitely  Current Coumadin Regimen 1 (5 mg) each day except 1.5 (7.5 mg) a day on Fridays

## 2006-08-18 NOTE — Unmapped (Signed)
Signed by [name] MD on 08/18/2006 at 15:35:19  Patient: [name]  Note: All result statuses are Final unless otherwise noted.    Tests: (1) PROTHROMBIN TIME WITH INR (UYQ-0347)   INTERNATIONAL NORMALIZED RATIO (INR)                              3.3                INR REFERENCE INTERVAL APPLIES TO PATIENTS                NOT ON ANTICOAGULANT THERAPY:                    0.9- 1.1                       SUGGESTED INR THERAPEUTIC RANGE FOR ORAL                ANTICOAGULANT THERAPY (STABLY ANTICOAGULATED                PATIENTS)                                 ROUTINE THERAPY:                       2.0- 3.0                                 RECURRENT MYOCARDIAL INFARCTION                          OR MECHANICAL PROSTHETIC VALVES:       2.5- 3.5                  PROTHROMBIN TIME     [H]  31.1 sec                    9.0-11.5    Note: An exclamation mark (!) indicates a result that was not dispersed into   the flowsheet.  Document Creation Date: 08/18/2006 2:40 PM  _______________________________________________________________________    (1) Order result status: Final  Collection or observation date-time: 08/18/2006 10:43  Requested date-time:   Receipt date-time: 08/18/2006 13:55  Reported date-time: 08/18/2006 14:00  Referring Physician:    Ordering Physician: [name] [facility])  Specimen Source: B  Source: Arline Asp Order Number: [id number]  Lab site: [facility] [place]      [address]      [place]  [place]  [address]

## 2006-09-11 LAB — LIPID PANEL
Chol/HDL Ratio: 3.8 (ref <=5.0)
Cholesterol, Total: 109 mg/dL — ABNORMAL LOW (ref 125–200)
HDL: 29 mg/dL — ABNORMAL LOW (ref >=40)
LDL Cholesterol: 55 mg/dL (ref <130)
Triglycerides: 124 mg/dL (ref <150)

## 2006-09-11 LAB — COMPREHENSIVE METABOLIC PANEL
A/G Ratio: 1.9 (ref 1.0–2.1)
ALT: 20 units/L (ref 9–60)
AST: 20 units/L (ref 10–35)
Albumin: 4.4 g/dL (ref 3.6–5.1)
Alkaline Phosphatase: 64 units/L (ref 40–115)
BUN/Creatinine Ratio: 19 (ref 6–22)
BUN: 26 mg/dL (ref 7–25)
CO2: 28 mmol/L (ref 21–33)
Calcium: 8.9 mg/dL (ref 8.6–10.2)
Chloride: 105 mmol/L (ref 98–110)
Creatinine: 1.4 mg/dL (ref 0.50–1.30)
GFR MDRD Af Amer: >60 mL/min (ref >=60)
GFR MDRD Non Af Amer: 51 mL/min (ref >=60)
Globulin, Total: 2.3 g/dL (ref 2.1–3.7)
Glucose: 112 mg/dL (ref 65–99)
Potassium: 4 mmol/L (ref 3.5–5.3)
Sodium: 141 mmol/L (ref 135–146)
Total Bilirubin: 0.6 mg/dL (ref 0.2–1.2)
Total Protein: 6.7 g/dL (ref 6.2–8.3)

## 2006-09-11 LAB — T4, FREE: Free T4: 1.8 ng/dL (ref 0.8–1.8)

## 2006-09-11 LAB — PSA, TOTAL AND FREE: PSA: 1.3 ng/mL (ref <=4.0)

## 2006-09-11 LAB — PROTIME-INR
INR: 3.5
Protime: 33.6 s (ref 9.0–11.5)

## 2006-09-11 LAB — TSH: TSH: 1.06 u[IU]/mL (ref 0.40–4.50)

## 2006-09-11 LAB — HEMOGLOBIN A1C: Hemoglobin A1C: 6 % — ABNORMAL HIGH

## 2006-09-11 NOTE — Unmapped (Signed)
Signed by [name] MD on 09/11/2006 at 22:52:39  Patient: [name]  Note: All result statuses are Final unless otherwise noted.    Tests: (1) COMPREHENSIVE METABOLIC PANEL W/EGFR (QDL-10231)    GLUCOSE              [H]  112 mg/dL                   84-13                  FASTING REFERENCE INTERVAL    UREA NITROGEN (BUN)  [H]  26 mg/dL                    2-44    CREATININE           [H]  1.4 mg/dL                   0.50-1.30   eGFR NON-AFR. AMERICAN                         [L]  51 mL/min/1.47m2            > OR = 60   eGFR AFRICAN AMERICAN                              >60 mL/min/1.52m2           > OR = 60   BUN/CREATININE RATIO (calc)                              19                          6-22    SODIUM                    141 mmol/L                  135-146    POTASSIUM                 4.0 mmol/L                  3.5-5.3    CHLORIDE                  105 mmol/L                  98-110    CARBON DIOXIDE            28 mmol/L                   21-33    CALCIUM                   8.9 mg/dL                   0.1-02.7    PROTEIN, TOTAL            6.7 g/dL                    2.5-3.6    ALBUMIN                   4.4 g/dL  3.6-5.1    GLOBULIN (calc)           2.3 g/dL                    2.0-2.5   ALBUMIN/GLOBULIN RATIO (calc)                              1.9                         1.0-2.1    BILIRUBIN, TOTAL          0.6 mg/dL                   4.2-7.0    ALKALINE PHOSPHATASE      64 U/L                      40-115    AST                       20 U/L                      10-35    ALT                       20 U/L                      9-60    Note: An exclamation mark (!) indicates a result that was not dispersed into   the flowsheet.  Document Creation Date: 09/11/2006 9:42 PM  _______________________________________________________________________    (1) Order result status: Final  Collection or observation date-time: 09/11/2006 11:48  Requested date-time:   Receipt date-time: 09/11/2006  18:50  Reported date-time: 09/11/2006 21:00  Referring Physician:    Ordering Physician: [name] [facility])  Specimen Source: S  Source: Arline Asp Order Number: [id number]  Lab site: [facility] [place]      [address]      [place]  [place]  [address]

## 2006-09-11 NOTE — Unmapped (Signed)
Signed by [name] MD on 09/12/2006 at 06:58:11  Patient: [name]  Note: All result statuses are Final unless otherwise noted.    Tests: (1) T-4, FREE (QDL-866)    T-4, FREE                 1.8 ng/dL                   6.5-7.8    Note: An exclamation mark (!) indicates a result that was not dispersed into   the flowsheet.  Document Creation Date: 09/12/2006 1:56 AM  _______________________________________________________________________    (1) Order result status: Final  Collection or observation date-time: 09/11/2006 11:48  Requested date-time:   Receipt date-time: 09/11/2006 18:50  Reported date-time: 09/12/2006 01:00  Referring Physician:    Ordering Physician: [name] [facility])  Specimen Source: S  Source: Arline Asp Order Number: [id number]  Lab site: [facility] [place]      [address]      [place]  [place]  [address]

## 2006-09-11 NOTE — Unmapped (Signed)
Signed by [name] MD on 09/11/2006 at 11:29:17      Reason for Visit   Chief Complaint: hypertension, hyperlipidemia    History from: patient    Allergies  No Known Allergies    Medications   COUMADIN 5 MG TABS (WARFARIN SODIUM) one by mouth daily  VYTORIN 10-20 MG TABS (EZETIMIBE-SIMVASTATIN) one po daily  LISINOPRIL-HYDROCHLOROTHIAZIDE 20-12.5 MG TABS (LISINOPRIL-HYDROCHLOROTHIAZIDE) 1 by mouth daily  COREG 12.5 MG TABS (CARVEDILOL) Take one twice a day  GLYBURIDE-METFORMIN 2.5-500 MG TABS (GLYBURIDE-METFORMIN) Take one daily  LEVOTHYROXINE SODIUM 100 MCG TABS (LEVOTHYROXINE SODIUM) 1 by mouth daily  PROTONIX 40 MG TBEC (PANTOPRAZOLE SODIUM) Take one daily  SAW PALMETTO EXTR (SAW PALMETTO) 1 by mouth daily  GLUCOSAMINE SULFATE POWD (GLUCOSAMINE SULFATE)   PACERONE 200 MG TABS (AMIODARONE HCL) one and a half by mouth daily       Vital Signs:   Wt: 251 lbs.      Wt chg (lbs): -11  Pulse: 84 (regular)  BP: 128/78    Intake recorded by: Tora Kindred MA on September 11, 2006 11:06 AM        HPI Multiple Chronic   Condition(s): Atrial Fibrillation, Diabetes, Erectile Dysfunction, Hyperlipidemia, Hypertension, Hypothyroidism.  Comments: working on losing weight    no abnormal bruising    Current Status:     Home Glucose Monitoring: yes  Frequency: BID  High: 149 Low: 60   Compliance with diet: good  Compliance with tx: good  Compliance with exercise: fair  Medication Tolerance: good  Compliance Comments:   walking less since hurt foot  has been seeing the foot doctor regularly -- ankle anteriorly    ROS/Symptoms:   Patient complains of the following endocrine symptoms: some hypoglycemia  Patient denies any cardiovascular symptoms.  Patient denies any respiratory symptoms.  Patient denies any GI symptoms.  Patient denies any neurologic symptoms.  Patient complains of the following GU symptoms: has ed - no nitrate being used -- used viagra in past, helped -- would like to try Cialis  Patient complains of the following  musculoskeletal symptoms: foot pain only musculoskeletal co -- it is a whole lot better          Physical Examination:   BP: 128/  78   Blood Pressure updated by physician.    Physical Exam- Detail:   General Appearance: OBESE IN NAD  Respiratory: Respiration un-labored.  Lung fields clear to auscultation.  No wheezing, rales, rhonchi or pleural rub.  Neck: Full ROM.  No thyromegaly, no nodules, masses, or tenderness.   Cardiac: irr irr  Vascular: No carotid bruits.  No edema or varicosities.  Pedal Pulse Left: dorsalis pedis +2, posterior tib +2  Pedal Pulse Right: dorsalis pedis +2,  posterior tib +2  Monofilament Left: normal  Monofilament Right: normal  Abdomen: No masses or tenderness. Bowel sounds active x4 quad.  Liver and spleen are without tenderness or enlargement.  No hernias.  Neurologic: no tremor  dtrs normal        Follow-up for Test Results:         Preventive Maintenance          Coordinating Care Providers   PCP Name: [facility] MD    Assessment and Plan  Status of Existing Problems:  Assessed HYPOTHYROIDISM NOS as unchanged - check lab - [name] MD  Assessed DIABETES MELLITUS as improved - check lab  poss decrease in metform/glyb dose - [name] MD  Assessed HYPERTENSION  as improved - no change - [name] MD  Assessed HYPERLIPIDEMIA as improved - lost weight, check lab - [name] MD  Assessed ATRIAL FIBRILLATION as unchanged - check protime - [name] MD    Today's Orders   TSH   (TSH) [phone]  T4 free  (FT4) (866) [CPT-84439]  Hemoglobin  A1C   (GLYCO) (496) [CPT-83036]  Prothrombin Time   (PT) (8847) [CPT-85610]  CMP   (METAPNL) (10231) [CPT-80053]  Lipid Profile   (FATS) (7600) [CPT-80061]  99214 - Ofc Vst, Est Level IV [CPT-99214]  HgA1C  -  >= 12mos. care  < 7% [CPT-3044F]  LDL  -  >= 12mos. care  < 100 [CPT-3048F]  BP Systolic  -  >= 12mos. care  < 130 [CPT-3074F]  BP Diastolic  -  >= 12mos. care  < 80 [CPT-3078F]  PSA, Screen MCR (PSASCR) (5363)  [CPT-G0103]    Disposition:   Return to clinic for Doctor Visit in 4-6 month(s)                         ]

## 2006-09-11 NOTE — Unmapped (Signed)
Signed by [name] MD on 09/12/2006 at 06:58:11  Patient: [name]  Note: All result statuses are Final unless otherwise noted.    Tests: (1) PSA, TOTAL (QDL-5363)    PSA, TOTAL                1.3 ng/mL                   < OR = 4.0      THIS TEST WAS PERFORMED USING THE SIEMENS (BAYER)      CHEMILUMINESCENT METHOD. VALUES OBTAINED FROM      DIFFERENT ASSAY METHODS CANNOT BE USED      INTERCHANGEABLY. PSA LEVELS, REGARDLESS OF      VALUE, SHOULD NOT BE INTERPRETED AS ABSOLUTE      EVIDENCE OF THE PRESENCE OR ABSENCE OF DISEASE.           Note: An exclamation mark (!) indicates a result that was not dispersed into   the flowsheet.  Document Creation Date: 09/12/2006 12:11 AM  _______________________________________________________________________    (1) Order result status: Final  Collection or observation date-time: 09/11/2006 11:48  Requested date-time:   Receipt date-time: 09/11/2006 18:50  Reported date-time: 09/12/2006 00:00  Referring Physician:    Ordering Physician: [name] [facility])  Specimen Source: S  Source: Arline Asp Order Number: [id number]  Lab site: [facility] [place]      [address]      [place]  [place]  [address]

## 2006-09-11 NOTE — Unmapped (Signed)
Signed by [name] MD on 09/12/2006 at 06:58:11  Patient: [name]  Note: All result statuses are Final unless otherwise noted.    Tests: (1) TSH, 3RD GENERATION (QDL-899)    TSH, 3RD GENERATION       1.06 mIU/L                  0.40-4.50    Note: An exclamation mark (!) indicates a result that was not dispersed into   the flowsheet.  Document Creation Date: 09/12/2006 1:56 AM  _______________________________________________________________________    (1) Order result status: Final  Collection or observation date-time: 09/11/2006 11:48  Requested date-time:   Receipt date-time: 09/11/2006 18:50  Reported date-time: 09/12/2006 01:00  Referring Physician:    Ordering Physician: [name] [facility])  Specimen Source: S  Source: Arline Asp Order Number: [id number] [id number]  Lab site: [facility] [place]      [address]      [place]  [place]  [address]

## 2006-09-11 NOTE — Unmapped (Signed)
Signed by [name] MD on 09/11/2006 at 22:52:39  Patient: [name]  Note: All result statuses are Final unless otherwise noted.    Tests: (1) LIPID PANEL (QDL-7600)    TRIGLYCERIDES             124 mg/dL                   <295    CHOLESTEROL, TOTAL   [L]  109 mg/dL                   621-308    HDL CHOLESTEROL      [L]  29 mg/dL                    > OR = 40   LDL-CHOLESTEROL (calc)                              55 mg/dL                    <657             DESIRABLE RANGE <100 MG/DL FOR PATIENTS WITH CHD OR      DIABETES AND <70 MG/DL FOR DIABETIC PATIENTS WITH      KNOWN HEART DISEASE.          CHOL/HDLC RATIO (calc)                              3.8                         < OR = 5.0    Note: An exclamation mark (!) indicates a result that was not dispersed into   the flowsheet.  Document Creation Date: 09/11/2006 9:42 PM  _______________________________________________________________________    (1) Order result status: Final  Collection or observation date-time: 09/11/2006 11:48  Requested date-time:   Receipt date-time: 09/11/2006 18:50  Reported date-time: 09/11/2006 21:00  Referring Physician:    Ordering Physician: [name] [facility])  Specimen Source: S  Source: Arline Asp Order Number: [id number] F-[id number]  Lab site: [facility] [place]      [address]      [place]  [place]  [address]

## 2006-09-11 NOTE — Unmapped (Signed)
Signed by Tora Kindred MA on 09/11/2006 at 18:09:45      Coumadin Management     Patient PT: 33.6 (09/11/2006 11:40:00 AM)  INR: 3.5 (09/11/2006 11:40:00 AM)  Target INR Range: 2.0-3.0  Duration Tx: indefinitely  Indication for Coumadin Therapy: Atrial Fibrillation  Current Coumadin Regimen one by mouth daily   New Coumadin Regimen one by mouth daily except one half of a pill on Monday Wednesday and Friday  Next Protime in: 2 weeks    Follow-up for Test Results:   Comments: skip coumadin today  Action taken: PT/INR is too high and the dose of coumadin needs decreased., call patient and clarify relative factors  Follow-up by: [name] MD,  September 11, 2006 4:44 PM    New Medications:  COUMADIN 5 MG TABS (WARFARIN SODIUM) one by mouth daily except one half of a pill on Monday Wednesday and Friday    Additional Follow-up for Test Results:   Instructions given to: Patient  Action Taken: The patient/family/other verbalizes a clear understanding of the above plan.  Comments: talked with patient  Additional Follow-up by: Tama Headings MA,  September 11, 2006 5:24 PM

## 2006-09-11 NOTE — Unmapped (Signed)
Signed by [name] MD on 09/11/2006 at 16:43:35  Patient: [name]  Note: All result statuses are Final unless otherwise noted.    Tests: (1) PROTHROMBIN TIME WITH INR (ZOX-0960)   INTERNATIONAL NORMALIZED RATIO (INR)                              3.5                INR REFERENCE INTERVAL APPLIES TO PATIENTS                NOT ON ANTICOAGULANT THERAPY:                    0.9- 1.1                       SUGGESTED INR THERAPEUTIC RANGE FOR ORAL                ANTICOAGULANT THERAPY (STABLY ANTICOAGULATED                PATIENTS)                                 ROUTINE THERAPY:                       2.0- 3.0                                 RECURRENT MYOCARDIAL INFARCTION                          OR MECHANICAL PROSTHETIC VALVES:       2.5- 3.5                  PROTHROMBIN TIME     [H]  33.6 sec                    9.0-11.5    Note: An exclamation mark (!) indicates a result that was not dispersed into   the flowsheet.  Document Creation Date: 09/11/2006 1:54 PM  _______________________________________________________________________    (1) Order result status: Final  Collection or observation date-time: 09/11/2006 11:40  Requested date-time:   Receipt date-time: 09/11/2006 13:08  Reported date-time: 09/11/2006 13:00  Referring Physician:    Ordering Physician: [name] [facility])  Specimen Source: B  Source: Arline Asp Order Number: [id number] [id number]  Lab site: [facility] [place]      [address]      [place]  [place]  [address]

## 2006-09-11 NOTE — Unmapped (Signed)
Signed by [name] MD on 09/12/2006 at 06:58:11  Patient: [name]  Note: All result statuses are Final unless otherwise noted.    Tests: (1) HEMOGLOBIN A1c (QDL-496)   HEMOGLOBIN A1c (% of total Hgb)                         [H]  6.0 %      NON-DIABETIC: <6.0%           Note: An exclamation mark (!) indicates a result that was not dispersed into   the flowsheet.  Document Creation Date: 09/12/2006 2:45 AM  _______________________________________________________________________    (1) Order result status: Final  Collection or observation date-time: 09/11/2006 11:48  Requested date-time:   Receipt date-time: 09/11/2006 18:50  Reported date-time: 09/12/2006 02:00  Referring Physician:    Ordering Physician: [name] [facility])  Specimen Source: B  Source: Arline Asp Order Number: [id number]  Lab site: [facility] [place]      [address]      [place]  [place]  [address]

## 2006-09-12 NOTE — Unmapped (Signed)
Signed by [name] MD on 09/12/2006 at 07:01:11            [name], M.D.  [address] [place], [place] [address]  Phone [phone]  Fax [phone]      September 12, 2006      [place]  [address] [place], [place] [address]                                                                                           RE:  TEST RESULTS   [name]--09/14/1940)         Dear [name]:      The following is an interpretation of your most recent tests.  Please take note of any instructions provided.  Prostate Specific Antigen (PSA):  normal          PSA: 1.3       Electrolyte studies:  Normal         Glucose test abnormal: 112   Kidney function studies:  Good    Liver function studies:  Good    Lipid panel:  Good          Triglyceride: 124   Cholesterol: 109   LDL: 55   HDL: 29   Chol/HDL%:  3.8   Thyroid studies:  Good        TSH: 1.06      HgbA1c:  Improved, Good          HgbA1c: 6.0           Additional Comments: When treating high cholesterol, our primary goal is to lower the bad cholesterol (LDL) under 100.  With a prior history of heart artery problems or diabetes, we like to get the LDL under 70 if possible.  The triglycerides should ideally be under 150 -- somewhat higher is OK under some circumstances.  The good cholesterol (HDL) should be at least one-fifth of the total cholesterol.    Your cholesterol profile looks good.  Please continue your current cholesterol medication and check another blood test in 4 to 6 months.    Your hemoglobin A1c shows that your glucose control has been good for the last 3 months.  You should change your glyburide/metformin pill to just metformin and recheck this blood test in 4-6 months.  A prescription has been written and should be included with this letter.    Your thyroid blood test is good.  You should continue your current dose of thyroid medication and check another blood test in 6 to 12 months.         Medications Prescribed or  Changed  METFORMIN HCL 500 MG TABS (METFORMIN HCL) one by mouth daily        Sincerely,        [name] MD

## 2006-09-12 NOTE — Unmapped (Signed)
Signed by Tora Kindred MA on 09/12/2006 at 08:14:24    Prescriptions:  METFORMIN HCL 500 MG TABS (METFORMIN HCL) one by mouth daily  #90 x 3   Entered by: Tora Kindred MA   Authorized by: [name] MD   Signed by: Tora Kindred MA on 09/12/2006   Method used: Reprint   RxID: [id number]

## 2006-10-19 LAB — PROTIME-INR
INR: 1.6
Protime: 16.4 s — ABNORMAL HIGH (ref 9.0–11.5)

## 2006-10-19 NOTE — Unmapped (Signed)
Signed by [name] MD on 10/19/2006 at 23:03:59  Patient: [name]  Note: All result statuses are Final unless otherwise noted.    Tests: (1) PROTHROMBIN TIME WITH INR (ZOX-0960)   INTERNATIONAL NORMALIZED RATIO (INR)                              1.6                INR REFERENCE INTERVAL APPLIES TO PATIENTS                NOT ON ANTICOAGULANT THERAPY:                    0.9- 1.1                       SUGGESTED INR THERAPEUTIC RANGE FOR ORAL                ANTICOAGULANT THERAPY (STABLY ANTICOAGULATED                PATIENTS)                                 ROUTINE THERAPY:                       2.0- 3.0                                 RECURRENT MYOCARDIAL INFARCTION                          OR MECHANICAL PROSTHETIC VALVES:       2.5- 3.5                  PROTHROMBIN TIME     [H]  16.4 sec                    9.0-11.5    Note: An exclamation mark (!) indicates a result that was not dispersed into   the flowsheet.  Document Creation Date: 10/19/2006 4:17 PM  _______________________________________________________________________    (1) Order result status: Final  Collection or observation date-time: 10/19/2006 12:28  Requested date-time:   Receipt date-time: 10/19/2006 15:25  Reported date-time: 10/19/2006 15:00  Referring Physician:    Ordering Physician: [name] [facility])  Specimen Source: B  Source: Arline Asp Order Number: [id number]  Lab site: [facility] [place]      [address]      [place]  [place]  [phone]

## 2006-10-19 NOTE — Unmapped (Signed)
Signed by Tora Kindred MA on 10/20/2006 at 12:08:37      Coumadin Management     Patient PT: 16.4 (10/19/2006 12:28:00 PM)  INR: 1.6 (10/19/2006 12:28:00 PM)  Target INR Range: 2.0-3.0  Duration Tx: indefinitely  Indication for Coumadin Therapy: Atrial Fibrillation  Current Coumadin Regimen one by mouth daily except one half of a pill on Monday Wednesday and Friday  New Coumadin Regimen one by mouth daily   Next Protime in: 1 month    Follow-up for Test Results:   Action taken: PT/INR is too low and the dose of coumadin needs increased.  Follow-up by: [name] MD,  October 19, 2006 11:04 PM    New Medications:  COUMADIN 5 MG TABS (WARFARIN SODIUM) one by mouth daily    Additional Follow-up for Test Results:   Comments: left message to call  Additional Follow-up by: Tora Kindred MA,  October 20, 2006 11:02 AM    Additional Follow-up for Test Results:   Instructions given to: Patient  Action Taken: The patient/family/other verbalizes a clear understanding of the above plan.  Comments: talked with patient  Additional Follow-up by: Tora Kindred MA,  October 20, 2006 12:08 PM

## 2006-10-24 NOTE — Unmapped (Signed)
Signed by Tama Headings MA on 10/24/2006 at 13:51:34    Phone Note   Patient Call  Call back at Home Phone: [phone]  Caller: patient  Department: IM - General  Call for: Ruddy Swire    Pharmacy Information: [facility] [phone]   Summary of Call: Pt would like a rx for Cialis called [facility], said that he spoke to dr Newman Pies about this at his physical appt last month but never got the script.     Initial call taken by: Colin Ina,  October 24, 2006 12:38 PM      New Medications:  CIALIS 5 MG TABS (TADALAFIL) one by mouth daily as needed    Follow-up for Phone Call   Pt notified & rx called in  phone call completed  Follow-up by: Tama Headings MA,  October 24, 2006 1:51 PM    Prescriptions:  CIALIS 5 MG TABS (TADALAFIL) one by mouth daily as needed  #10 x 3   Entered and Authorized by: [name] MD   Signed by: [name] MD on 10/24/2006   Method used: Telephoned to ...        RxID: [id number]

## 2006-11-03 NOTE — Unmapped (Signed)
Signed by Tora Kindred MA on 11/03/2006 at 13:30:26    Phone Note   Patient Call  Call back at Home Phone: [phone]  Caller: patient  Call for: [facility]    Summary of Call: refill - Warfarin 5mg , 90 day supply with refills.  please mail written rx to pt - [phone] [facility] [address] Deborah Chalk [phone]     Initial call taken by: Gibson Ramp,  November 03, 2006 9:14 AM      Prescriptions:  COUMADIN 5 MG TABS (WARFARIN SODIUM) one by mouth daily  #90 x 0   Entered by: Tora Kindred MA   Authorized by: Arlyss Repress NP   Signed by: Tora Kindred MA on 11/03/2006   Method used: Reprint   RxID: [id number]  COUMADIN 5 MG TABS (WARFARIN SODIUM) one by mouth daily  #90 x 0   Entered and Authorized by: Arlyss Repress NP   Signed by: Arlyss Repress NP on 11/03/2006   Method used: Telephoned to ...        RxID: [id number]

## 2006-11-15 LAB — PROTIME-INR
INR: 1.7
Protime: 16.5 s (ref 9.0–11.5)

## 2006-11-15 NOTE — Unmapped (Signed)
Signed by Verdie Shire MD on 11/16/2006 at [phone]      Reason for Visit   Chief Complaint: rash to  back  area and on R right side x one week     History from: patient    Allergies  No Known Allergies    Medications   COUMADIN 5 MG TABS (WARFARIN SODIUM) one by mouth daily  VYTORIN 10-20 MG TABS (EZETIMIBE-SIMVASTATIN) one po daily  LISINOPRIL-HYDROCHLOROTHIAZIDE 20-12.5 MG TABS (LISINOPRIL-HYDROCHLOROTHIAZIDE) 1 by mouth daily  COREG 12.5 MG TABS (CARVEDILOL) Take one twice a day  METFORMIN HCL 500 MG TABS (METFORMIN HCL) one by mouth daily  LEVOTHYROXINE SODIUM 100 MCG TABS (LEVOTHYROXINE SODIUM) 1 by mouth daily  PROTONIX 40 MG TBEC (PANTOPRAZOLE SODIUM) Take one daily  SAW PALMETTO EXTR (SAW PALMETTO) 1 by mouth daily  GLUCOSAMINE SULFATE POWD (GLUCOSAMINE SULFATE)   PACERONE 200 MG TABS (AMIODARONE HCL) one and a half by mouth daily  CIALIS 5 MG TABS (TADALAFIL) one by mouth daily as needed        Vital Signs:   Wt: 256 lbs.      Wt chg (lbs): 5  Pulse: 72 (regular)    Patient appears to be in acute distress: no  BP: 150/84    Intake recorded by: Bary Richard MA on November 15, 2006 3:15 PM      History of Present Illness: he developed itchy red spots on the left chest, right back and legs.   they are not painful.  he is outside on his motorcycle all the time.           Physical Examination:   BP: 150/  84    Physical Exam- Detail:   General Appearance: well-developed, well-nourished and in no acute distress.  Skin: red papules on chest, thighs, right side  Respiratory: Respiration un-labored.  Lung fields clear to auscultation.  No wheezing, rales, rhonchi or pleural rub.  Cardiac: S1 and S2 normal.          Follow-up for Test Results:     New Problems:  INSECT BITE (ICD-919.4)  New Medications:  LIDEX 0.05 % CREA (FLUOCINONIDE) apply twice a day      Preventive Maintenance          Coordinating Care Providers   PCP Name: Neack MD    Assessment and Plan  Status of Existing Problems:  Assessed INSECT BITE  as new - his rash looks like some type bite.  I will give him a steroid cream. - Verdie Shire MD - Signed  New Problems:  Dx of INSECT BITE (ICD-919.4)  Onset: 11/08/2006    Medications   New Prescriptions/Refills:  LIDEX 0.05 % CREA (FLUOCINONIDE) apply twice a day  #60 gm x 1, 11/15/2006, Verdie Shire MD    New medications:  LIDEX 0.05 % CREA -- apply twice a day  Start date: 11/15/2006      Today's Orders   99213 - Ofc Vst, Est Level III [CPT-99213]            Prescriptions:  LIDEX 0.05 % CREA (FLUOCINONIDE) apply twice a day  #60 gm x 1   Entered and Authorized by: Verdie Shire MD   Signed by: Verdie Shire MD on 11/15/2006   Method used: Print then Give to Patient   RxID: [id number]                ]

## 2006-11-15 NOTE — Unmapped (Signed)
Signed by [name] MD on 11/16/2006 at 06:44:48  Patient: [name]  Note: All result statuses are Final unless otherwise noted.    Tests: (1) PROTHROMBIN TIME WITH INR (ZOX-0960)   INTERNATIONAL NORMALIZED RATIO (INR)                              1.7                INR REFERENCE INTERVAL APPLIES TO PATIENTS                NOT ON ANTICOAGULANT THERAPY:                    0.9- 1.1                       SUGGESTED INR THERAPEUTIC RANGE FOR ORAL                ANTICOAGULANT THERAPY (STABLY ANTICOAGULATED                PATIENTS)                                 ROUTINE THERAPY:                       2.0- 3.0                                 RECURRENT MYOCARDIAL INFARCTION                          OR MECHANICAL PROSTHETIC VALVES:       2.5- 3.5                  PROTHROMBIN TIME     [H]  16.5 sec                    9.0-11.5    Note: An exclamation mark (!) indicates a result that was not dispersed into   the flowsheet.  Document Creation Date: 11/15/2006 6:52 PM  _______________________________________________________________________    (1) Order result status: Final  Collection or observation date-time: 11/15/2006 16:06  Requested date-time:   Receipt date-time: 11/15/2006 18:07  Reported date-time: 11/15/2006 18:00  Referring Physician:    Ordering Physician: [name] [facility])  Specimen Source: B  Source: Arline Asp Order Number: [id number]  Lab site: [facility] [place]      [address]      [place]  [phone]

## 2006-11-16 NOTE — Unmapped (Signed)
Signed by Tama Headings MA on 11/16/2006 at 14:56:27      Coumadin Management     Patient PT: 16.5 (11/15/2006 4:06:00 PM)  INR: 1.7 (11/15/2006 4:06:00 PM)  Target INR Range: 2.0-3.0  Duration Tx: indefinitely  Indication for Coumadin Therapy: Atrial Fibrillation  Current Coumadin Regimen one by mouth daily   New Coumadin Regimen one by mouth daily except for one and a half (7.5 mg) on Mondays and Thursdays  Next Protime in: 1 month    Follow-up for Test Results:   Action taken: PT/INR is too low and the dose of coumadin needs increased.  Follow-up by: [name] MD,  November 16, 2006 6:45 AM    New Medications:  COUMADIN 5 MG TABS (WARFARIN SODIUM) one by mouth daily except for one and a half (7.5 mg) on Mondays and Thursdays    Additional Follow-up for Test Results:   Comments: Left message for pt to call back  Additional Follow-up by: Tama Headings MA,  November 16, 2006 11:34 AM    Additional Follow-up for Test Results:   Action Taken: The patient/family/other verbalizes a clear understanding of the above plan.  Additional Follow-up by: Tama Headings MA,  November 16, 2006 2:55 PM

## 2006-11-17 NOTE — Unmapped (Signed)
Signed by Tora Kindred MA on 11/17/2006 at 14:19:17    Phone Note   Call from Pharmacy    Pharmacy Name: [facility]  Caller: Robbie  Pharmacy Phone Number: [phone]  Call for: [facility]  Summary of call: calling you re: coumadin that was recently prescribed - there is a possible drug interaction with this med and the Vytorin and Amiodarone that pt is taking.  please call and advise.  (please give them this reference # - [id number]).  Initial call taken by: Gibson Ramp,  November 17, 2006 10:36 AM      Follow-up for Phone Call   He's been on those three together for a very long time.  Follow-up by: [name] MD,  November 17, 2006 12:26 PM    Additional Follow-up for Phone Call   called   pharmacy and told them  Additional Follow-up by: Tora Kindred MA,  November 17, 2006 2:19 PM

## 2006-12-14 NOTE — Unmapped (Signed)
Signed by Tora Kindred MA on 12/14/2006 at 16:21:51    Phone Note   Patient Call  Call back at Home Phone: [phone]  Caller: patient  Call for: [facility]    Reason for Call: refill medication  Summary of Call: Pt requesting rf Vytorin 10/20mg  90day for mail order, Please call when ready [phone]     Initial call taken by: Barnetta Hammersmith,  December 14, 2006 11:16 AM      Follow-up for Phone Call   called the patient  phone call completed  Follow-up by: Tora Kindred MA,  December 14, 2006 4:21 PM    Prescriptions:  VYTORIN 10-20 MG TABS (EZETIMIBE-SIMVASTATIN) one po daily  #90 x 1   Entered and Authorized by: [name] MD   Signed by: [name] MD on 12/14/2006   Method used: Print then Give to Patient   RxID: [id number]

## 2006-12-18 LAB — PROTIME-INR
INR: 3.1
Protime: 29.5 s — ABNORMAL HIGH (ref 9.0–11.5)

## 2006-12-18 NOTE — Unmapped (Signed)
Signed by [name] MD on 12/18/2006 at 17:55:30  Patient: [name]  Note: All result statuses are Final unless otherwise noted.    Tests: (1) PROTHROMBIN TIME WITH INR (ZOX-0960)   INTERNATIONAL NORMALIZED RATIO (INR)                              3.1      INR REFERENCE INTERVAL APPLIES TO PATIENTS      NOT ON ANTICOAGULANT THERAPY:                 0.9- 1.1             SUGGESTED INR THERAPEUTIC RANGE FOR ORAL      ANTICOAGULANT THERAPY (STABLE ANTICOAGULATED      PATIENTS)                    ROUTINE THERAPY:                       2.0- 3.0                    RECURRENT MYOCARDIAL INFARCTION             OR MECHANICAL PROSTHETIC VALVES:       2.5- 3.5           PROTHROMBIN TIME     [H]  29.5 sec                    9.0-11.5    Note: An exclamation mark (!) indicates a result that was not dispersed into   the flowsheet.  Document Creation Date: 12/18/2006 3:06 PM  _______________________________________________________________________    (1) Order result status: Final  Collection or observation date-time: 12/18/2006 09:57  Requested date-time:   Receipt date-time: 12/18/2006 14:14  Reported date-time: 12/18/2006 14:00  Referring Physician:    Ordering Physician: [name] [facility])  Specimen Source: B  Source: Arline Asp Order Number: [id number]  Lab site: [facility] [place]      [address]      [place]  [place]  [phone]

## 2006-12-18 NOTE — Unmapped (Signed)
Signed by Tora Kindred MA on 12/18/2006 at 18:10:44      Coumadin Management     Patient PT: 29.5 (12/18/2006 9:57:00 AM)  INR: 3.1 (12/18/2006 9:57:00 AM)  Target INR Range: 2.0-3.0  Duration Tx: indefinitely  Indication for Coumadin Therapy: Atrial Fibrillation  Current Coumadin Regimen one by mouth daily except for one and a half (7.5 mg) on Mondays and Thursdays  New Coumadin Regimen one by mouth daily   Next Protime in: 1 month    Follow-up for Test Results:   Action taken: PT/INR is too high and the dose of coumadin needs decreased.  Follow-up by: [name] MD,  December 18, 2006 5:56 PM    New Medications:  COUMADIN 5 MG TABS (WARFARIN SODIUM) one by mouth daily    Additional Follow-up for Test Results:   Instructions given to: Patient  Action Taken: The patient/family/other verbalizes a clear understanding of the above plan.  Comments: called patient  Additional Follow-up by: Tora Kindred MA,  December 18, 2006 6:10 PM

## 2007-01-17 LAB — PROTIME-INR
INR: 2.1
Protime: 20.2 s (ref 9.0–11.5)

## 2007-01-17 NOTE — Unmapped (Signed)
Signed by Tora Kindred MA on 01/17/2007 at 10:32:38    Phone Note   Patient Call  Call back at Home Phone: [phone]  Caller: patient  Call for: neack    Reason for Call: refill medication  Summary of Call: pt needs refill of protonix  40 mg 1x/day; pt will pick up mail order rx;  pls call when ready     Initial call taken by: Cherly Hensen,  January 17, 2007 9:06 AM      Follow-up for Phone Call   called patient to pick up  Follow-up by: Tora Kindred MA,  January 17, 2007 10:32 AM    Prescriptions:  PROTONIX 40 MG TBEC (PANTOPRAZOLE SODIUM) Take one daily  #90 x 3   Entered and Authorized by: Arlyss Repress NP   Signed by: Arlyss Repress NP on 01/17/2007   Method used: Print then Give to Patient   RxID: [id number]

## 2007-01-17 NOTE — Unmapped (Signed)
Signed by [name] MD on 01/17/2007 at 20:18:24  Patient: [name]  Note: All result statuses are Final unless otherwise noted.    Tests: (1) PROTHROMBIN TIME WITH INR (GEX-5284)   INTERNATIONAL NORMALIZED RATIO (INR)                              2.1      INR REFERENCE INTERVAL APPLIES TO PATIENTS      NOT ON ANTICOAGULANT THERAPY:                 0.9- 1.1             SUGGESTED INR THERAPEUTIC RANGE FOR ORAL      ANTICOAGULANT THERAPY (STABLE ANTICOAGULATED      PATIENTS)                    ROUTINE THERAPY:                       2.0- 3.0                    RECURRENT MYOCARDIAL INFARCTION             OR MECHANICAL PROSTHETIC VALVES:       2.5- 3.5           PROTHROMBIN TIME     [H]  20.2 sec                    9.0-11.5    Note: An exclamation mark (!) indicates a result that was not dispersed into   the flowsheet.  Document Creation Date: 01/17/2007 2:27 PM  _______________________________________________________________________    (1) Order result status: Final  Collection or observation date-time: 01/17/2007 11:03  Requested date-time:   Receipt date-time: 01/17/2007 13:21  Reported date-time: 01/17/2007 14:00  Referring Physician:    Ordering Physician: [name] [facility])  Specimen Source: B  Source: Arline Asp Order Number: [id number]  Lab site: [facility] [place]      [address]      [place]  [place]  [phone]

## 2007-01-17 NOTE — Unmapped (Signed)
Signed by Tora Kindred MA on 01/18/2007 at 08:34:26      Coumadin Management     Patient PT: 20.2 (01/17/2007 11:03:00 AM)  INR: 2.1 (01/17/2007 11:03:00 AM)  Target INR Range: 2.0-3.0  Duration Tx: indefinitely  Indication for Coumadin Therapy: Atrial Fibrillation  Current Coumadin Regimen one by mouth daily   New Coumadin Regimen one by mouth daily   Next Protime in: 1 month    Follow-up for Test Results:   Action taken: PT/INR is OK.  No change in dosage of coumadin.  Follow-up by: [name] MD,  January 17, 2007 8:18 PM      Additional Follow-up for Test Results:   Instructions given to: Family Member  Action Taken: The patient/family/other verbalizes a clear understanding of the above plan.  Comments: called patient  Additional Follow-up by: Tora Kindred MA,  January 18, 2007 8:34 AM

## 2007-01-20 NOTE — Unmapped (Signed)
Signed by [name] MD on 01/20/2007 at 00:00:00  Consent-Pneumococcal      Imported By: Standley Dakins 05/15/2007 10:08:52    _____________________________________________________________________    External Attachment:    Please see [employer] EMR for this document.

## 2007-01-22 NOTE — Unmapped (Signed)
Signed by Tora Kindred MA on 01/22/2007 at 11:49:03    Clinical Lists Changes    Observations:  Added new observation of FLU VAX: done at [facility] (01/20/2007 11:47)

## 2007-02-23 NOTE — Unmapped (Signed)
Signed by Tora Kindred MA on 02/27/2007 at 11:09:07    Phone Note   Patient Call  Call back at Home Phone: [phone]  Caller: patient  Call for: Dr. Newman Pies    Summary of Call: needs refill warfarin 5mg  need for mail away 90 days---pt will pick up when ready please call pt at home.     Initial call taken by: Deloris Ping,  February 23, 2007 11:41 AM      Follow-up for Phone Call   left message to pick  phone call completed  Follow-up by: Tora Kindred MA,  February 27, 2007 11:08 AM    Prescriptions:  COUMADIN 5 MG TABS (WARFARIN SODIUM) one by mouth daily  #90 x 0   Entered by: Tora Kindred MA   Authorized by: Arlyss Repress NP   Signed by: Tora Kindred MA on 02/26/2007   Method used: Reprint   RxID: [id number]  COUMADIN 5 MG TABS (WARFARIN SODIUM) one by mouth daily  #90 x 0   Entered and Authorized by: Arlyss Repress NP   Signed by: Arlyss Repress NP on 02/23/2007   Method used: Telephoned to ...        RxID: [id number]

## 2007-02-27 LAB — PROTIME-INR
INR: 6.4
Protime: 58.2 s (ref 9.0–11.5)

## 2007-02-27 NOTE — Unmapped (Signed)
Signed by [name] MD on 02/27/2007 at 20:39:02  Patient: [name]  Note: All result statuses are Final unless otherwise noted.    Tests: (1) PROTHROMBIN TIME WITH INR ([id number])   INTERNATIONAL NORMALIZED RATIO (INR)                         [H]  6.4      VERIFIED BY REPEAT ANALYSIS      INR REFERENCE INTERVAL APPLIES TO PATIENTS      NOT ON ANTICOAGULANT THERAPY:                 0.9- 1.1             SUGGESTED INR THERAPEUTIC RANGE FOR ORAL      ANTICOAGULANT THERAPY (STABLE ANTICOAGULATED      PATIENTS)                    ROUTINE THERAPY:                       2.0- 3.0                    RECURRENT MYOCARDIAL INFARCTION             OR MECHANICAL PROSTHETIC VALVES:       2.5- 3.5           PROTHROMBIN TIME     [H]  58.2 sec                    9.0-11.5      VERIFIED BY REPEAT ANALYSIS    Note: An exclamation mark (!) indicates a result that was not dispersed into   the flowsheet.  Document Creation Date: 02/27/2007 7:34 PM  _______________________________________________________________________    (1) Order result status: Final  Collection or observation date-time: 02/27/2007 13:52  Requested date-time:   Receipt date-time: 02/27/2007 18:47  Reported date-time: 02/27/2007 19:00  Referring Physician:    Ordering Physician: [name] [facility])  Specimen Source: B  Source: Arline Asp Order Number: [id number]  Lab site: [facility] [place]      [address]      [place]  [place]  [phone]

## 2007-02-27 NOTE — Unmapped (Signed)
Signed by Tora Kindred MA on 02/28/2007 at 09:21:53         Follow-up for Test Results:   Comments: I spoke to him tonight.  He is to hold his coumadin until further notice and come in this Friday for a stat protime on Friday morning.  The order for this has been written and is in the computer system at the office. He is to call or go to the ER for bleeding symptoms.  Follow-up by: [name] MD,  February 27, 2007 8:39 PM    Additional Follow-up for Test Results:   Action Taken: Phone Call Completed- patient informed of results  Comments: done  Additional Follow-up by: Tora Kindred MA,  February 28, 2007 9:21 AM

## 2007-03-02 LAB — PROTIME-INR
INR: 2.4
Protime: 22.9 s (ref 9.0–11.5)

## 2007-03-02 NOTE — Unmapped (Signed)
Signed by [name] MD on 03/02/2007 at 13:05:52  Patient: [name]  Note: All result statuses are Final unless otherwise noted.    Tests: (1) PROTHROMBIN TIME WITH INR (ZOX-0960)   INTERNATIONAL NORMALIZED RATIO (INR)                              2.4      INR REFERENCE INTERVAL APPLIES TO PATIENTS      NOT ON ANTICOAGULANT THERAPY:                 0.9- 1.1             SUGGESTED INR THERAPEUTIC RANGE FOR ORAL      ANTICOAGULANT THERAPY (STABLE ANTICOAGULATED      PATIENTS)                    ROUTINE THERAPY:                       2.0- 3.0                    RECURRENT MYOCARDIAL INFARCTION             OR MECHANICAL PROSTHETIC VALVES:       2.5- 3.5           PROTHROMBIN TIME     [H]  22.9 sec                    9.0-11.5    Note: An exclamation mark (!) indicates a result that was not dispersed into   the flowsheet.  Document Creation Date: 03/02/2007 1:05 PM  _______________________________________________________________________    (1) Order result status: Final  Collection or observation date-time: 03/02/2007 09:02  Requested date-time:   Receipt date-time: 03/02/2007 12:06  Reported date-time: 03/02/2007 12:00  Referring Physician:    Ordering Physician: [name] [facility])  Specimen Source: B  Source: Arline Asp Order Number: [id number]  Lab site: [facility] [place]      [address]      [place]  [place]  [phone]

## 2007-03-02 NOTE — Unmapped (Signed)
Signed by Tora Kindred MA on 03/02/2007 at 13:25:42      Coumadin Management     Patient PT: 22.9 (03/02/2007 9:02:00 AM)  INR: 2.4 (03/02/2007 9:02:00 AM)  Target INR Range: 2.0-3.0  Duration Tx: indefinitely  Indication for Coumadin Therapy: Atrial Fibrillation  Current Coumadin Regimen one by mouth daily   New Coumadin Regimen one half of a pill Monday, Tuesday, Thursday, Friday and Sunday  one whole pill Wednesday and Saturday  Next Protime in: 1 month    Follow-up for Test Results:   Follow-up by: [name] MD,  March 02, 2007 1:06 PM    New Medications:  COUMADIN 5 MG TABS (WARFARIN SODIUM) one half of a pill Monday, Tuesday, Thursday, Friday and Sunday one whole pill Wednesday and Saturday    Additional Follow-up for Test Results:   Instructions given to: Patient  Action Taken: The patient/family/other verbalizes a clear understanding of the above plan.  Comments: called patient  Additional Follow-up by: Tora Kindred MA,  March 02, 2007 1:25 PM

## 2007-03-19 LAB — COMPREHENSIVE METABOLIC PANEL
A/G Ratio: 1.5 (ref 1.0–2.1)
ALT: 34 units/L (ref 9–60)
AST: 21 units/L (ref 10–35)
Albumin: 4.1 g/dL (ref 3.6–5.1)
Alkaline Phosphatase: 87 units/L (ref 40–115)
BUN/Creatinine Ratio: 16 (ref 6–22)
BUN: 23 mg/dL (ref 7–25)
CO2: 29 mmol/L (ref 21–33)
Calcium: 8.9 mg/dL (ref 8.6–10.2)
Chloride: 101 mmol/L (ref 98–110)
Creatinine: 1.4 mg/dL (ref 0.50–1.30)
GFR MDRD Af Amer: >60 mL/min (ref >=60)
GFR MDRD Non Af Amer: 51 mL/min (ref >=60)
Globulin, Total: 2.7 g/dL (ref 2.1–3.7)
Glucose: 165 mg/dL (ref 65–99)
Potassium: 4.2 mmol/L (ref 3.5–5.3)
Sodium: 139 mmol/L (ref 135–146)
Total Bilirubin: 0.6 mg/dL (ref 0.2–1.2)
Total Protein: 6.8 g/dL (ref 6.2–8.3)

## 2007-03-19 LAB — TSH: TSH: 3.37 microintl units/mL (ref 0.40–4.50)

## 2007-03-19 LAB — LIPID PANEL
Chol/HDL Ratio: 3.9 (ref <=5.0)
Cholesterol, Total: 132 mg/dL (ref 125–200)
HDL: 34 mg/dL (ref >=40)
LDL Cholesterol: 73 mg/dL (ref <130)
Triglycerides: 124 mg/dL (ref <150)

## 2007-03-19 LAB — HEMOGLOBIN A1C: Hemoglobin A1C: 7.2 %

## 2007-03-19 LAB — T4, FREE: Free T4: 1.7 ng/dL (ref 0.8–1.8)

## 2007-03-19 LAB — OCCULT BLOOD: Fecal Occult Bld: NEGATIVE

## 2007-03-19 NOTE — Unmapped (Signed)
Signed by [name] MD on 03/19/2007 at 22:04:46  Patient: [name]  Note: All result statuses are Final unless otherwise noted.    Tests: (1) LIPID PANEL (QDL-7600)    TRIGLYCERIDES             124 mg/dL                   <846    CHOLESTEROL, TOTAL        132 mg/dL                   [id number]    HDL CHOLESTEROL      [L]  34 mg/dL                    > OR = 40   LDL-CHOLESTEROL (calc)                              73 mg/dL                    <841             DESIRABLE RANGE <100 MG/DL FOR PATIENTS WITH CHD OR      DIABETES AND <70 MG/DL FOR DIABETIC PATIENTS WITH      KNOWN HEART DISEASE.          CHOL/HDLC RATIO (calc)                              3.9                         < OR = 5.0    Note: An exclamation mark (!) indicates a result that was not dispersed into   the flowsheet.  Document Creation Date: 03/19/2007 9:39 PM  _______________________________________________________________________    (1) Order result status: Final  Collection or observation date-time: 03/19/2007 13:43  Requested date-time:   Receipt date-time: 03/19/2007 19:18  Reported date-time: 03/19/2007 21:00  Referring Physician:    Ordering Physician: [name] [facility])  Specimen Source: S  Source: Arline Asp Order Number: [id number]  Lab site: [facility] [place]      [address]      [place]  [place]  [phone]

## 2007-03-19 NOTE — Unmapped (Signed)
Signed by [name] MD on 03/20/2007 at 06:25:33  Patient: [name]  Note: All result statuses are Final unless otherwise noted.    Tests: (1) HEMOGLOBIN A1c (QDL-496)   HEMOGLOBIN A1c (% of total Hgb)                         [H]  7.2 %      NON-DIABETIC: <6.0%                   REPORT COMMENT:      FASTING    Note: An exclamation mark (!) indicates a result that was not dispersed into   the flowsheet.  Document Creation Date: 03/19/2007 10:57 PM  _______________________________________________________________________    (1) Order result status: Final  Collection or observation date-time: 03/19/2007 13:43  Requested date-time:   Receipt date-time: 03/19/2007 19:18  Reported date-time: 03/19/2007 22:00  Referring Physician:    Ordering Physician: [name] [facility])  Specimen Source: B  Source: Arline Asp Order Number: [id number]  Lab site: [facility] [place]      [address]      [place]  [place]  [address]

## 2007-03-19 NOTE — Unmapped (Signed)
Signed by [name] MD on 03/20/2007 at 06:25:33  Patient: [name]  Note: All result statuses are Final unless otherwise noted.    Tests: (1) T-4, FREE (QDL-866)    T-4, FREE                 1.7 ng/dL                   4.1-3.2    Note: An exclamation mark (!) indicates a result that was not dispersed into   the flowsheet.  Document Creation Date: 03/19/2007 11:57 PM  _______________________________________________________________________    (1) Order result status: Final  Collection or observation date-time: 03/19/2007 13:43  Requested date-time:   Receipt date-time: 03/19/2007 19:18  Reported date-time: 03/19/2007 23:00  Referring Physician:    Ordering Physician: [name] [facility])  Specimen Source: S  Source: Arline Asp Order Number: [id number]  Lab site: [facility] [place]      [address]      [place]  [place]  [address]

## 2007-03-19 NOTE — Unmapped (Addendum)
Signed by [name] MD on 03/19/2007 at 13:22:31      Reason for Visit   Chief Complaint: fell off ladder x 1 week  ago  having pain in buttock area    History from: patient    Allergies  No Known Allergies    Medications   COUMADIN 5 MG TABS (WARFARIN SODIUM) one half of a pill Monday, Tuesday, Thursday, Friday and Sunday one whole pill Wednesday and Saturday  VYTORIN 10-20 MG TABS (EZETIMIBE-SIMVASTATIN) one po daily  LISINOPRIL-HYDROCHLOROTHIAZIDE 20-12.5 MG TABS (LISINOPRIL-HYDROCHLOROTHIAZIDE) 1 by mouth daily  COREG 12.5 MG TABS (CARVEDILOL) Take one twice a day  METFORMIN HCL 500 MG TABS (METFORMIN HCL) one by mouth daily  LEVOTHYROXINE SODIUM 100 MCG TABS (LEVOTHYROXINE SODIUM) 1 by mouth daily  PROTONIX 40 MG TBEC (PANTOPRAZOLE SODIUM) Take one daily  SAW PALMETTO EXTR (SAW PALMETTO) 1 by mouth daily  GLUCOSAMINE SULFATE POWD (GLUCOSAMINE SULFATE)   PACERONE 200 MG TABS (AMIODARONE HCL) one and a half by mouth daily  CIALIS 5 MG TABS (TADALAFIL) one by mouth daily as needed  LIDEX 0.05 % CREA (FLUOCINONIDE) apply twice a day       Vital Signs:   Wt: 257 lbs.      Wt chg (lbs): 1  Pulse: 88 (regular)  BP: 136/78  Cuff size: large    Intake recorded by: Tora Kindred MA on March 19, 2007 12:58 PM        HPI Multiple Chronic   Condition(s): Diabetes, Hyperlipidemia.  Additional Dx: cough  fell on bottom  Comments: fasting today    Current Status:     Home Glucose Monitoring: yes    Compliance with diet: fair  Compliance with tx: good  Compliance with exercise: poor  Medication Tolerance: good  Compliance Comments:   glucoses a little high 160 2 hr pp, not doing fasting  taking 1.5 metformin a day    ROS/Symptoms:   Patient denies any cardiovascular symptoms.  Patient complains of the following respiratory symptoms: has to cough periodically -- ent told him that this is due to the lisinopril -- coughing 3-4 months, on this 6 months -- likes the price of this med, doesn't want to change  Patient  complains of the following GI symptoms: pain with need to have bm, better after bm since the fall  Patient complains of the following neurologic symptoms: no headaches if sleeps with cpap  Patient denies any neurologic symptoms.  Patient complains of the following musculoskeletal symptoms: one week ago on ladder, fell -- went to ER and xrayed -- no fracture  Patient denies any heme/lymph symptoms.    Past History  Social History: Tobacco Usage:non-smoker        Physical Examination:   BP: 136/  78   Blood Pressure updated by physician.    Physical Exam- Detail:   General Appearance: obese in nad  Respiratory: Respiration un-labored.  Lung fields clear to auscultation.  No wheezing, rales, rhonchi or pleural rub.  Chest: No masses, lesions, or tenderness.  Cardiac: S1 and S2 normal.  RRR without murmurs, rubs, gallops.  No JVD.  Vascular: No carotid bruits.  No edema or varicosities.  Pedal Pulse Left: dorsalis pedis +2, posterior tib +2  Pedal Pulse Right: dorsalis pedis +2,  posterior tib +2  Abdomen: No masses or tenderness. Bowel sounds active x4 quad.  Liver and spleen are without tenderness or enlargement.  No hernias.  Rectal Exam: hemorroid  Hemoccult: Guiac negative  Prostate Exam: Prostate non-enlarged, symmetrical, without nodularity or tenderness.      Other tests:     Stool - Occult Blood    #1: Guiac negative         Follow-up for Test Results:     New Problems:  HEMORRHOIDS (ICD-455.6)  New Medications:  ANUSOL-HC 25 MG SUPP (HYDROCORTISONE ACE (RECTAL)) one per rectum three times a day as needed      Preventive Maintenance     Vision Test Date: 06/10/2006 Vision Test Result: Normal   Eye Exam:   06/10/2006  no retinopathy      Coordinating Care Providers   PCP Name: Newman Pies MD    Assessment and Plan  Status of Existing Problems:  Assessed HEMORRHOIDS as new - due to perianal trauma and constipation  glycerine supp disc  anusol hc se disc - [name] MD  Assessed HYPERLIPIDEMIA as unchanged - check  lab - [name] MD  Assessed DIABETES MELLITUS as deteriorated - check a1c - [name] MD  New Problems:  Dx of HEMORRHOIDS (ICD-455.6)     Medications   New Prescriptions/Refills:  LEVOTHYROXINE SODIUM 100 MCG TABS (LEVOTHYROXINE SODIUM) 1 by mouth daily  #90 x 3, 03/19/2007, [name] MD  ANUSOL-HC 25 MG SUPP (HYDROCORTISONE ACE (RECTAL)) one per rectum three times a day as needed  #30 x 0, 03/19/2007, [name] MD    New medications:  ANUSOL-HC 25 MG SUPP -- one per rectum three times a day as needed  Start date: 03/19/2007      Today's Orders   99214 - Ofc Vst, Est Level IV [CPT-99214]  Hemoccult (Single - in office) (V76.41) [CPT-82272]  CMP   (METAPNL) (10231) [CPT-80053]  Lipid Profile   (FATS) (7600) [CPT-80061]  Hemoglobin  A1C   (GLYCO) (496) [CPT-83036]  TSH   (TSH) (899) [CPT-84443]  T4 free  (FT4) (866) [CPT-84439]    Disposition:   as needed             Prescriptions:  LEVOTHYROXINE SODIUM 100 MCG TABS (LEVOTHYROXINE SODIUM) 1 by mouth daily  #90 x 3   Entered and Authorized by: [name] MD   Signed by: [name] MD on 03/19/2007   Method used: Print then Give to Patient   RxID: [id number]  ANUSOL-HC 25 MG SUPP (HYDROCORTISONE ACE (RECTAL)) one per rectum three times a day as needed  #30 x 0   Entered and Authorized by: [name] MD   Signed by: [name] MD on 03/19/2007   Method used: Print then Give to Patient   RxID: [id number]                ]  Signed by [name] MD on 03/28/2007 at 21:26:19              Allergies  No Known Allergies    Medications                        Follow-up for Test Results:         Preventive Maintenance       Coordinating Care Providers   PCP Name: Siana Panameno MD      Today's Orders   HgA1C  -  >= 12mos. care  7 to 9 % [CPT-3045F]  LDL  -  >= 12mos. care  < 100 [CPT-3048F]  BP Systolic  -  >= 12mos. care  130-139 [CPT-3075F]  BP Diastolic  -  >= 12mos.  care  < 80 [CPT-3078F]

## 2007-03-19 NOTE — Unmapped (Signed)
Signed by [name] MD on 03/20/2007 at 06:25:33  Patient: [name]  Note: All result statuses are Final unless otherwise noted.    Tests: (1) TSH, 3RD GENERATION (QDL-899)    TSH, 3RD GENERATION       3.37 mIU/L                  0.40-4.50            REPORT COMMENT:      FASTING    Note: An exclamation mark (!) indicates a result that was not dispersed into   the flowsheet.  Document Creation Date: 03/19/2007 11:58 PM  _______________________________________________________________________    (1) Order result status: Final  Collection or observation date-time: 03/19/2007 13:43  Requested date-time:   Receipt date-time: 03/19/2007 19:18  Reported date-time: 03/19/2007 23:00  Referring Physician:    Ordering Physician: [name] [facility])  Specimen Source: S  Source: Arline Asp Order Number: [id number]  Lab site: [facility] [place]      [address]      [place]  [place]  [phone]

## 2007-03-19 NOTE — Unmapped (Signed)
Signed by [name] MD on 03/19/2007 at 22:04:46  Patient: [name]  Note: All result statuses are Final unless otherwise noted.    Tests: (1) COMPREHENSIVE METABOLIC PANEL W/EGFR (QDL-10231)    GLUCOSE              [H]  165 mg/dL                   56-43                  FASTING REFERENCE INTERVAL    UREA NITROGEN (BUN)       23 mg/dL                    3-29    CREATININE           [H]  1.4 mg/dL                   0.50-1.30   eGFR NON-AFR. AMERICAN                         [L]  51 mL/min/1.35m2            > OR = 60   eGFR AFRICAN AMERICAN                              >60 mL/min/1.39m2           > OR = 60   BUN/CREATININE RATIO (calc)                              16                          6-22    SODIUM                    139 mmol/L                  135-146    POTASSIUM                 4.2 mmol/L                  3.5-5.3    CHLORIDE                  101 mmol/L                  98-110    CARBON DIOXIDE            29 mmol/L                   21-33    CALCIUM                   8.9 mg/dL                   5.1-88.4    PROTEIN, TOTAL            6.8 g/dL                    1.6-6.0    ALBUMIN                   4.1 g/dL  3.6-5.1    GLOBULIN (calc)           2.7 g/dL                    0.1-6.0   ALBUMIN/GLOBULIN RATIO (calc)                              1.5                         1.0-2.1    BILIRUBIN, TOTAL          0.6 mg/dL                   1.0-9.3    ALKALINE PHOSPHATASE      87 U/L                      40-115    AST                       21 U/L                      10-35    ALT                       34 U/L                      9-60            REPORT COMMENT:      FASTING    Note: An exclamation mark (!) indicates a result that was not dispersed into   the flowsheet.  Document Creation Date: 03/19/2007 9:39 PM  _______________________________________________________________________    (1) Order result status: Final  Collection or observation date-time: 03/19/2007 13:43  Requested  date-time:   Receipt date-time: 03/19/2007 19:18  Reported date-time: 03/19/2007 21:00  Referring Physician:    Ordering Physician: [name] [facility])  Specimen Source: S  Source: Arline Asp Order Number: [id number]  Lab site: [facility] [place]      [address]      [place]  [place]  [address]

## 2007-03-20 NOTE — Unmapped (Signed)
Signed by [name] MD on 03/20/2007 at 06:33:39            [name], M.D.  [address] [place], [place] [address]  Phone [phone]  Fax [phone]      March 20, 2007      [place]  [address] [place], [place] [address]                                                                                           RE:  TEST RESULTS   [name]--07/12/1940)         Dear [name]:      The following is an interpretation of your most recent tests.  Please take note of any instructions provided.    Electrolyte studies:  Good         Glucose test abnormal: 165   Kidney function studies:  Good, Stable    Liver function studies:  Good    Lipid panel:  Good          Triglyceride: 124   Cholesterol: 132   LDL: 73   HDL: 34   Chol/HDL%:  3.9   Thyroid studies:  Good        TSH: 3.37      Diabetic studies:  Fair          HgbA1c: 7.2           Additional Comments: When treating high cholesterol, our primary goal is to lower the bad cholesterol (LDL) under 100.  With a prior history of heart artery problems or diabetes, we like to get the LDL under 70 if possible.  The triglycerides should ideally be under 150 -- somewhat higher is OK under some circumstances.  The good cholesterol (HDL) should be at least one-fifth of the total cholesterol.    Your cholesterol profile looks good.  Please continue your current cholesterol medication and check another blood test in 4 to 6 months.    Your thyroid blood test is good.  You should continue your current dose of thyroid medication and check another blood test in 6 to 12 months.    Your hemoglobin A1c shows that your glucose control has been fair for the last 3 months and is progressively worse since we changed your diabetes medication.  Please replace your metformin with the glyburide/metformin prescription enclosed and check another hemoglobin A1c in 4 to 6 months.       Medications Prescribed or Changed  GLUCOVANCE 2.5-500 MG TABS  (GLYBURIDE-METFORMIN) one by mouth qam      Medications Discontinued   METFORMIN HCL 500 MG TABS (METFORMIN HCL) one by mouth daily        Sincerely,        [name] MD

## 2007-03-20 NOTE — Unmapped (Signed)
Signed by Tora Kindred MA on 03/20/2007 at 08:21:04    Prescriptions:  GLUCOVANCE 2.5-500 MG TABS (GLYBURIDE-METFORMIN) one by mouth qam  #90 x 3   Entered by: Tora Kindred MA   Authorized by: [name] MD   Signed by: Tora Kindred MA on 03/20/2007   Method used: Reprint   RxID: [id number]  LEVOTHYROXINE SODIUM 100 MCG TABS (LEVOTHYROXINE SODIUM) 1 by mouth daily  #90 x 3   Entered by: Tora Kindred MA   Authorized by: [name] MD   Signed by: Tora Kindred MA on 03/20/2007   Method used: Reprint   RxID: [id number]

## 2007-04-09 LAB — PROTIME-INR
INR: 1.9
Protime: 18.5 s (ref 9.0–11.5)

## 2007-04-09 NOTE — Unmapped (Signed)
Signed by [name] MD on 04/09/2007 at 21:19:54  Patient: [name]  Note: All result statuses are Final unless otherwise noted.    Tests: (1) PROTHROMBIN TIME WITH INR (YQI-3474)   INTERNATIONAL NORMALIZED RATIO (INR)                              1.9      INR REFERENCE INTERVAL APPLIES TO PATIENTS      NOT ON ANTICOAGULANT THERAPY:                 0.9- 1.1             SUGGESTED INR THERAPEUTIC RANGE FOR ORAL      ANTICOAGULANT THERAPY (STABLE ANTICOAGULATED      PATIENTS)                    ROUTINE THERAPY:                       2.0- 3.0                    RECURRENT MYOCARDIAL INFARCTION             OR MECHANICAL PROSTHETIC VALVES:       2.5- 3.5           PROTHROMBIN TIME     [H]  18.5 sec                    9.0-11.5    Note: An exclamation mark (!) indicates a result that was not dispersed into   the flowsheet.  Document Creation Date: 04/09/2007 7:48 PM  _______________________________________________________________________    (1) Order result status: Final  Collection or observation date-time: 04/09/2007 16:39  Requested date-time:   Receipt date-time: 04/09/2007 18:38  Reported date-time: 04/09/2007 19:00  Referring Physician:    Ordering Physician: [name] [facility])  Specimen Source: B  Source: Arline Asp Order Number: [id number]  Lab site: [facility] [place]      [address]      [place]  [place]  [address]

## 2007-04-09 NOTE — Unmapped (Signed)
Signed by Tora Kindred MA on 04/10/2007 at 08:16:48      Coumadin Management     Patient PT: 18.5 (04/09/2007 4:39:00 PM)  INR: 1.9 (04/09/2007 4:39:00 PM)  Target INR Range: 2.0-3.0  Duration Tx: indefinitely  Indication for Coumadin Therapy: Atrial Fibrillation  Current Coumadin Regimen one half of a pill Monday, Tuesday, Thursday, Friday and Sunday  one whole pill Wednesday and Saturday  New Coumadin Regimen one half of a pill Monday, Tuesday, Thursday, Friday and Sunday  one whole pill Wednesday and Saturday  Next Protime in: 1 month    Follow-up for Test Results:   Action taken: PT/INR is OK.  No change in dosage of coumadin.  Follow-up by: [name] MD,  April 09, 2007 9:19 PM      Additional Follow-up for Test Results:   Instructions given to: Patient  Action Taken: The patient/family/other verbalizes a clear understanding of the above plan.  Comments: called patient  Additional Follow-up by: Tora Kindred MA,  April 10, 2007 8:16 AM

## 2007-05-01 NOTE — Unmapped (Signed)
Signed by [name] MD on 05/01/2007 at 00:00:00  Graded Treadmill Exercise       Imported By: Deon Pilling 11/08/2007 15:02:57    _____________________________________________________________________    External Attachment:    Please see [employer] EMR for this document.

## 2007-05-01 NOTE — Unmapped (Signed)
Signed by Vania Rea Brandilee Pies MD on 05/01/2007 at 00:00:00  GXT      Imported By: Coletta Memos 05/22/2009 23:29:18    _____________________________________________________________________    External Attachment:    Please see [employer] EMR for this document.

## 2007-05-01 NOTE — Unmapped (Signed)
Signed by [name] MD on 05/01/2007 at 00:00:00  Cardiac       Imported By: Deon Pilling 11/15/2007 07:40:10    _____________________________________________________________________    External Attachment:    Please see [employer] EMR for this document.

## 2007-05-14 NOTE — Unmapped (Signed)
Signed by Tora Kindred MA on 05/14/2007 at 14:08:39    Phone Note   Other Incoming Call    Call From: [facility]  Caller's Name: William Hamburger Phone #: [phone]  Caller Cell Phone #: [phone]  Call For: Dr. Newman Pies  Summary of call: need any EKG or echo having protime on 2/4 and would need that.  Please fax to [facility].A.[facility]. Department [phone]  Initial call taken by: Deloris Ping,  May 14, 2007 1:37 PM      Follow-up for Phone Call   faxed to [place]  Follow-up by: Tora Kindred MA,  May 14, 2007 2:08 PM

## 2007-05-16 LAB — PROTIME-INR
INR: 1.1
Protime: 10.8 s (ref 9.0–11.5)

## 2007-05-16 NOTE — Unmapped (Signed)
Signed by [name] MD on 05/16/2007 at 16:11:31  Patient: [name]  Note: All result statuses are Final unless otherwise noted.    Tests: (1) PROTHROMBIN TIME WITH INR (GNF-6213)   INTERNATIONAL NORMALIZED RATIO (INR)                              1.1      INR REFERENCE INTERVAL APPLIES TO PATIENTS      NOT ON ANTICOAGULANT THERAPY:                 0.9- 1.1             SUGGESTED INR THERAPEUTIC RANGE FOR ORAL      ANTICOAGULANT THERAPY (STABLE ANTICOAGULATED      PATIENTS)                    ROUTINE THERAPY:                       2.0- 3.0                    RECURRENT MYOCARDIAL INFARCTION             OR MECHANICAL PROSTHETIC VALVES:       2.5- 3.5           PROTHROMBIN TIME          10.8 sec                    9.0-11.5    Note: An exclamation mark (!) indicates a result that was not dispersed into   the flowsheet.  Document Creation Date: 05/16/2007 12:48 PM  _______________________________________________________________________    (1) Order result status: Final  Collection or observation date-time: 05/16/2007 09:03  Requested date-time:   Receipt date-time: 05/16/2007 11:53  Reported date-time: 05/16/2007 12:00  Referring Physician:    Ordering Physician: [name] [facility])  Specimen Source: B  Source: Arline Asp Order Number: [id number]  Lab site: [facility] [place]      [address]      [place]  [place]  [address]

## 2007-05-17 NOTE — Unmapped (Signed)
Signed by [name] MD on 05/17/2007 at 00:00:00  Colonoscopy      Imported By: Standley Dakins 05/29/2007 11:15:22    _____________________________________________________________________    External Attachment:    Please see [employer] EMR for this document.

## 2007-05-30 NOTE — Unmapped (Signed)
Signed by Tora Kindred MA on 05/31/2007 at 14:15:47    Phone Note   Other Incoming Call    Call From: Another Physicians Office  Caller's Name: Dr Octaviano Glow  Call For: Dr Newman Pies  Reason for Call: talk with provider  Summary of call: Dr Octaviano Glow would like you to page him any time tomorrow 05/31/07 in regaurds to pt. Dr Octaviano Glow pager number is [phone]  Initial call taken by: Derwood Kaplan MA,  May 30, 2007 2:11 PM      Follow-up for Phone Call   I paged him right now.  Follow-up by: [name] MD,  May 30, 2007 8:07 PM    Additional Follow-up for Phone Call   Please get him -- he didn't call back last night.  Additional Follow-up by: [name] MD,  May 31, 2007 9:11 AM    Additional Follow-up for Phone Call   talked with dr. Octaviano Glow  Additional Follow-up by: Tora Kindred MA,  May 31, 2007 2:15 PM

## 2007-05-30 NOTE — Unmapped (Signed)
Signed by [name] MD on 05/30/2007 at 00:00:00  [facility]      Imported By: Burgess Estelle 09/07/2007 15:52:19    _____________________________________________________________________    External Attachment:    Please see [employer] EMR for this document.

## 2007-06-06 NOTE — Unmapped (Signed)
Signed by [name] MD on 06/06/2007 at 00:00:00  Cardiac       Imported By: Deon Pilling 09/18/2007 15:37:47    _____________________________________________________________________    External Attachment:    Please see [employer] EMR for this document.

## 2007-06-14 ENCOUNTER — Inpatient Hospital Stay

## 2007-06-14 LAB — PROTIME-INR
INR: 1.2
Protime: 12.1 s (ref 9.0–11.5)

## 2007-06-14 NOTE — Unmapped (Signed)
Signed by Tora Kindred MA on 06/14/2007 at 12:45:05      Coumadin Management     Patient PT: 12.1 (06/14/2007 8:53:00 AM)  INR: 1.2 (06/14/2007 8:53:00 AM)  Target INR Range: 2.0-3.0  Duration Tx: indefinitely  Indication for Coumadin Therapy: Atrial Fibrillation  Current Coumadin Regimen one half of a pill Monday, Tuesday, Thursday, Friday and Sunday  one whole pill Wednesday and Saturday  New Coumadin Regimen one (5mg ) by mouth daily   Next Protime in: 2 weeks    Follow-up for Test Results:   Action taken: PT/INR is too low and the dose of coumadin needs increased.  Follow-up by: [name] MD,  June 14, 2007 12:39 PM    New Medications:  COUMADIN 5 MG TABS (WARFARIN SODIUM) one whole pill by mouth daily    Additional Follow-up for Test Results:   Instructions given to: Patient  Action Taken: The patient/family/other verbalizes a clear understanding of the above plan., Instructed to watch for bleeding and call if bleeding noted.  Comments: .discussed with patient.   Additional Follow-up by: Tora Kindred MA,  June 14, 2007 12:45 PM

## 2007-06-14 NOTE — Unmapped (Signed)
Signed by [name] MD on 06/14/2007 at 12:38:08  Patient: [name]  Note: All result statuses are Final unless otherwise noted.    Tests: (1) PROTHROMBIN TIME WITH INR (ZYS-0630)   INTERNATIONAL NORMALIZED RATIO (INR)                              1.2      INR REFERENCE INTERVAL APPLIES TO PATIENTS      NOT ON ANTICOAGULANT THERAPY:                 0.9- 1.1             SUGGESTED INR THERAPEUTIC RANGE FOR ORAL      ANTICOAGULANT THERAPY (STABLE ANTICOAGULATED      PATIENTS)                    ROUTINE THERAPY:                       2.0- 3.0                    RECURRENT MYOCARDIAL INFARCTION             OR MECHANICAL PROSTHETIC VALVES:       2.5- 3.5           PROTHROMBIN TIME     [H]  12.1 sec                    9.0-11.5    Note: An exclamation mark (!) indicates a result that was not dispersed into   the flowsheet.  Document Creation Date: 06/14/2007 12:13 PM  _______________________________________________________________________    (1) Order result status: Final  Collection or observation date-time: 06/14/2007 08:53  Requested date-time:   Receipt date-time: 06/14/2007 11:08  Reported date-time: 06/14/2007 12:00  Referring Physician:    Ordering Physician: [name] [facility])  Specimen Source: B  Source: Arline Asp Order Number: [id number]  Lab site: [facility] [place]      [address]      [place]    [phone]

## 2007-06-14 NOTE — Unmapped (Signed)
Signed by   LinkLog[employer] on 06/15/2007 at 00:14:59  Patient: [name]  Note: All result statuses are Final unless otherwise noted.    Tests: (1) CT-CHEST W/O CONTRAST ([id number])    Order NotePricilla Handler Order Number: [id number]    Non-EMR Ordering Provider: Valla Leaver      Order Note:     *** VERIFIED ***         ADDENDED REPORT     ADDENDUM# 1  [facility]  Reason:  COUGH; R/O ILD  Dict.Staff: Tonita Phoenix [phone]  Dict.Res: Alba Cory [id number]  Verified By: Tonita Phoenix      Ver: 06/15/07  12:14 am  Exams:  CT-CHEST W/O CONTRAST      Addendum:    On expiratory imaging, there is marked narrowing of the trachea  and bronchi, consistent with tracheobronchial malacia  *** VERIFIED ***         ADDENDED REPORT     ADDENDUM# 0  [facility]  Reason:  COUGH; R/O ILD  Dict.Staff: Tonita Phoenix [phone]  Dict.Res: MCDANIEL, Liborio Nixon [id number]  Verified By: Tonita Phoenix      Ver: 06/14/07   1:34 pm  Exams:  CT-CHEST W/O CONTRAST    High resolution CT scan of the chest without contrast dated  06/14/2007.    Indication: 67 year old male with cough, history of amiodarone  use, rule out interstitial lung disease.    Comparison: None    Technique: Discontinuous axial CT images were obtained from the  lung apices through the upper abdomen without contrast at 1 cm  intervals during inspiration and 2 cm intervals during  expiration and reconstructed to a slice thickness of 1 mm with a  field-of-view of 44.4 cm.    Findings:    Several of the axial images are limited by motion artifact. The  visualized central airways are patent.    There are a few faint patchy areas of groundglass opacity in the  dependent portions of the lungs. This may represent dependent  atelectasis, however could also represent minimal interstitial  lung disease. There is no honeycombing, fibrosis, or  bronchiectasis. Expiratory imaging demonstrate mild areas of air  trapping with a lower lobe predominance.    The visualized mediastinum  does not demonstrate any hilar or  mediastinal lymphadenopathy. There are minimal coronary  calcifications. Otherwise, the visualized heart and great  vessels are unremarkable.    The visualized bones do not demonstrate any osteolytic or  osteoblastic lesions. There are a few old rib fractures.    Limited evaluation of the upper abdomen demonstrates 3 low  density lesions within the liver, the largest of which measures  2.7 x 2.1 cm and is fluid density, likely representing a cyst.  The other 2 are too small to characterize. These are  incompletely evaluated on this study.    Impression:    1. Minimal patchy areas of dependent lower lobe groundglass  opacity These areas could represent dependent atelectasis,  however could also represent mild interstitial lung disease.  This could be further evaluated followup prone HRCT to determine  whether this persists or resolves with prone positioning.    2. Mild areas of air trapping due to small airway disease or  reactive disease.    3. Three low density lesions within the liver, the largest  measuring fluid density, which are incompletely characterized on  the current study.  **** end of result ****    Order Note:   EMR Routing  to: Mearl Latin, MD - copy to - [phone]    Note: An exclamation mark (!) indicates a result that was not dispersed into   the flowsheet.  Document Creation Date: 06/15/2007 12:14 AM  _______________________________________________________________________    (1) Order result status: Final  Collection or observation date-time: 06/14/2007 08:25:00  Requested date-time: 06/14/2007 08:25:00  Receipt date-time:   Reported date-time: 06/15/2007 00:14:55  Referring Physician: Trudee Grip  Ordering Physician:  Non-EMR Physician [facility])  Specimen Source:   Source: QRS  Filler Order Number: [id number]  Lab site: [facility]

## 2007-06-15 LAB — COMPREHENSIVE METABOLIC PANEL
A/G Ratio: 1.9 (ref 1.0–2.1)
ALT: 31 U/L (ref 9–60)
AST: 21 U/L (ref 10–35)
Albumin: 4.3 g/dL (ref 3.6–5.1)
Alkaline Phosphatase: 90 U/L (ref 40–115)
BUN: 24 mg/dL (ref 7–25)
CO2: 29 mmol/L (ref 21–33)
Calcium: 9.2 mg/dL (ref 8.6–10.2)
Chloride: 103 mmol/L (ref 98–110)
Creatinine: 1.3 mg/dL (ref 0.50–1.30)
GFR MDRD Af Amer: >60 mL/min (ref >=60)
GFR MDRD Non Af Amer: 55 mL/min — ABNORMAL LOW (ref >=60)
Globulin, Total: 2.3 g/dL (ref 2.1–3.7)
Glucose: 134 mg/dL — ABNORMAL HIGH (ref 65–99)
Potassium: 4.1 mmol/L (ref 3.5–5.3)
Sodium: 140 mmol/L (ref 135–146)
Total Bilirubin: 0.3 mg/dL (ref 0.2–1.2)
Total Protein: 6.6 g/dL (ref 6.2–8.3)

## 2007-06-15 NOTE — Unmapped (Signed)
Signed by [name] MD on 06/16/2007 at 17:38:58  Patient: [name]  Note: All result statuses are Final unless otherwise noted.    Tests: (1) COMPREHENSIVE METABOLIC PANEL W/EGFR (QDL-10231)    GLUCOSE              [H]  134 mg/dL                   16-10      THE ABOVE TEST WAS PERFORMED; HOWEVER,      THE SPECIMEN WAS LIPEMIC.                  FASTING REFERENCE INTERVAL    UREA NITROGEN (BUN)       24 mg/dL                    9-60    CREATININE                1.3 mg/dL                   0.50-1.30   eGFR NON-AFR. AMERICAN                         [L]  55 mL/min/1.38m2            > OR = 60   eGFR AFRICAN AMERICAN                              >60 mL/min/1.85m2           > OR = 60   BUN/CREATININE RATIO (calc)                              NOT APPLICABLE              6-22      BUN/CREATININE RATIO IS NOT REPORTED WHEN THE BUN      AND CREATININE VALUES ARE WITHIN NORMAL LIMITS.    SODIUM                    140 mmol/L                  135-146    POTASSIUM                 4.1 mmol/L                  3.5-5.3    CHLORIDE                  103 mmol/L                  98-110    CARBON DIOXIDE            29 mmol/L                   21-33    CALCIUM                   9.2 mg/dL                   4.5-40.9    PROTEIN, TOTAL            6.6 g/dL  6.2-8.3    ALBUMIN                   4.3 g/dL                    4.5-4.0    GLOBULIN (calc)           2.3 g/dL                    9.8-1.1   ALBUMIN/GLOBULIN RATIO (calc)                              1.9                         1.0-2.1    BILIRUBIN, TOTAL          0.3 mg/dL                   9.1-4.7    ALKALINE PHOSPHATASE      90 U/L                      40-115    AST                       21 U/L                      10-35    ALT                       31 U/L                      9-60    Note: An exclamation mark (!) indicates a result that was not dispersed into   the flowsheet.  Document Creation Date: 06/15/2007 9:54  PM  _______________________________________________________________________    (1) Order result status: Final  Collection or observation date-time: 06/15/2007 14:15  Requested date-time:   Receipt date-time: 06/15/2007 19:27  Reported date-time: 06/15/2007 21:00  Referring Physician:    Ordering Physician: [name] [facility])  Specimen Source: S  Source: Arline Asp Order Number: [id number] [phone]  Lab site: [facility] [place]      [address]      [place]  [facility]  [id number]      -----------------    The following non-numeric lab results were dispersed to  the flowsheet even though numeric results were expected:      BUN/CREATININE RATIO (calc), NOT APPLICABLE

## 2007-06-15 NOTE — Unmapped (Signed)
Signed by Tora Kindred MA on 06/15/2007 at 08:48:19         Follow-up for Test Results:   Comments: He has three spots in the liver that may be cysts or dilated blood vessels (hemangiomas).  I recommend that he have a MR scan of the liver to better look at this.  The order for this has been written and is in the computer system at the office.   Action taken: call patient to inform of results  Follow-up by: [name] MD,  June 15, 2007 1:17 AM    Additional Follow-up for Test Results:   Comments: called patient and he will do mri  Additional Follow-up by: Tora Kindred MA,  June 15, 2007 8:48 AM    New Problems:  ? 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

## 2007-06-15 NOTE — Unmapped (Signed)
Signed by Tora Kindred MA on 06/15/2007 at 13:01:57    Phone Note   Patient Call    Summary of Call: patient called and said he needs a   cmp with glomerular filtration rate  cpt  80053  before they will schedule his mri abdomen and liver  need order     Initial call taken by: Tora Kindred MA,  June 15, 2007 9:54 AM      New Orders:  CMP   (METAPNL) (10231) [CPT-80053]  Follow-up for Phone Call   talked with patient and he will be in  Follow-up by: Tora Kindred MA,  June 15, 2007 1:01 PM

## 2007-06-16 NOTE — Unmapped (Signed)
Signed by Tama Headings MA on 06/18/2007 at 15:26:43         Follow-up for Test Results:   Comments: His renal function is still good enough for the test.  Follow-up by: [name] MD,  June 16, 2007 5:39 PM    Additional Follow-up for Test Results:   Action Taken: Phone Call Completed- patient informed of results  Additional Follow-up by: Tama Headings MA,  June 18, 2007 3:26 PM

## 2007-06-18 NOTE — Unmapped (Signed)
Signed by Tama Headings MA on 06/18/2007 at 15:25:13    Phone Note   Other Incoming Call    Call From: [facility] pt  Call For: [facility]  Summary of call: He is set up for tomorrow for his Abd MRI.  She said they always  do with & without contrast.  Need new order faxed to [phone]  Initial call taken by: Tama Headings MA,  June 18, 2007 2:37 PM      New Orders:  MRI, Abdomen w/ & w/o contrast  [CPT-74183]  Follow-up for Phone Call   Faxed to [facility]  phone call completed  Follow-up by: Tama Headings MA,  June 18, 2007 3:25 PM

## 2007-06-19 ENCOUNTER — Inpatient Hospital Stay

## 2007-06-19 NOTE — Unmapped (Signed)
Signed by   LinkLogic on 06/21/2007 at 15:40:02  Patient: [name]  Note: All result statuses are Final unless otherwise noted.    Tests: (1) MRI-ABDOMEN W/WO CONTRAST [phone])    Order NotePricilla Handler Order Number: [id number]    Order Note:     *** VERIFIED ***  [facility]  Reason:  LIVER HEMANGIOMA  Dict.Staff: Lebron Conners [phone]  Dict.Res: Macky Lower), MARK [phone]  Verified By: Norberta Keens A         Ver: 06/21/07   3:39 pm  Exams:  MRI-ABDOMEN W/WO CONTRAST      MRI abdomen without and with contrast 06/19/1999    Clinical Indication: Possible liver hemangioma.    Comparison: CT chest 06/14/2007    Technical factors: Coronal T2 HASTE, axial fat-suppressed T2,  axial T1, axial T2 HASTE, and axial T1-weighted gradient images  pre- and postgadolinium were obtained through the abdomen.  Postcontrast images were obtained following administration of 20  mL Magnevist intravenous gadolinium.    Findings:    There is a 3 x 2 cm low T1, high T2 lesion in the medial segment  left hepatic lobe corresponding to the low attenuation lesion  seen on CT. This mass demonstrates no enhancement and is thus  consistent with a cyst. There are multiple additional smaller  hyperintense T2 lesions throughout the left hepatic lobe,  exophytic off the right hepatic lobe just anterior to the  gallbladder fossa and within the inferior caudate that are too  small to characterize but demonstrate no definite enhancement  also suggesting cysts.    There is irregular low T1 and high T2 signal within the uncinate  process of the pancreas that appears ductal in orientation.    There are small parapelvic cysts within both kidneys.    Visualized spleen, adrenal glands, and GI tract appear normal.    Impression:    1. Multiple hepatic cysts.  2. 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  Correlation with endoscopic ultrasound is  recommended.  **** end of result ****    Order Note:   EMR Routing to: Mearl Latin, MD - ordering - [id number]    Note: An exclamation mark (!) indicates a result that was not dispersed into   the flowsheet.  Document Creation Date: 06/21/2007 3:40 PM  _______________________________________________________________________    (1) Order result status: Final  Collection or observation date-time: 06/19/2007 14:59:00  Requested date-time: 06/19/2007 14:59:00  Receipt date-time:   Reported date-time: 06/21/2007 15:39:56  Referring Physician: Cindi Carbon NON-STAFF  Ordering Physician: [name] [facility])  Specimen Source:   Source: QRS  Filler Order Number: [id number]  Lab site: [facility]

## 2007-06-19 NOTE — Unmapped (Signed)
Signed by Tora Kindred MA on 06/19/2007 at 13:41:08    Phone Note   Patient Call  Call back at Home Phone: [phone]  Caller: patient  Call for: Betania Dizon    Reason for Call: refill medication  Summary of Call: Carvedilol  12.5 mg;  warfarin  5 mg;  vytorin 10-20 mg   Pt requesting a 90-day mail order with refills if possible.  Pls call pt @ home when ready for p/u.     Initial call taken by: Cherly Hensen,  June 19, 2007 10:21 AM      Follow-up for Phone Call   ok  Follow-up by: [name] MD,  June 19, 2007 12:29 PM    Additional Follow-up for Phone Call   calle patient to pick up prescription  Additional Follow-up by: Tora Kindred MA,  June 19, 2007 1:41 PM    Prescriptions:  COREG 12.5 MG TABS (CARVEDILOL) Take one twice a day  #180 x 1   Entered and Authorized by: [name] MD   Signed by: [name] MD on 06/19/2007   Method used: Print then Give to Patient   RxID: [id number]  VYTORIN 10-20 MG TABS (EZETIMIBE-SIMVASTATIN) one po daily  #90 x 1   Entered and Authorized by: [name] MD   Signed by: [name] MD on 06/19/2007   Method used: Print then Give to Patient   RxID: [id number]  COUMADIN 5 MG TABS (WARFARIN SODIUM) one whole pill by mouth daily  #90 x 1   Entered and Authorized by: [name] MD   Signed by: [name] MD on 06/19/2007   Method used: Print then Give to Patient   RxID: [id number]

## 2007-06-21 NOTE — Unmapped (Signed)
Signed by Tora Kindred MA on 06/22/2007 at 08:11:32          New Problems:  R/O NEOPLASM, MALIGNANT, PANCREAS, HEAD (ICD-157.0)  ? 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  The liver cysts look benign, but there is a suggestion of something in the head of the pancreas (uncinate process).  I recommend that he see Dr Jeanmarie Plant or Dr Elige Ko for an endoscopic ultrasound.  The order for this has been written and is in the computer system at the office.  Please ask Dr Jeanmarie Plant to call me tomorrow about Mr Justiss.  Follow-up by: [name] MD,  June 21, 2007 9:01 PM

## 2007-06-25 NOTE — Unmapped (Signed)
Signed by [name] MD on 06/25/2007 at 00:00:00  Gastroenterology      Imported By: Deon Pilling 07/09/2007 12:32:16    _____________________________________________________________________    External Attachment:    Please see [facility] EMR for this document.

## 2007-06-29 NOTE — Unmapped (Signed)
Signed by Tora Kindred MA on 07/04/2007 at 09:16:47    Phone Note   Patient Call  Call back at Home Phone: [phone]  Caller: patient  Call for: neack    Reason for Call: medication issues  Reason for call: vytorin - too expensive  Summary of Call: Is there something else to replace the Vytorin?  It will cost pt $286 for 3 months.    Pls call pt to let him know.  This was for a mail order ([facility]).       Initial call taken by: Cherly Hensen,  June 29, 2007 2:54 PM      New Medications:  ZOCOR 20 MG TABS (SIMVASTATIN) one by mouth daily  PRAVACHOL 40 MG TABS (PRAVASTATIN SODIUM) one by mouth every evening  ZETIA 10 MG TABS (EZETIMIBE) one by mouth daily    Follow-up for Phone Call   Seperate the meds and they are probably less expensive.   Follow-up by: Arlyss Repress NP,  June 29, 2007 5:04 PM    Additional Follow-up for Phone Call   writer called pt to state rxs were written.  pt stated zocor does not work for him.  he wants something else  Additional Follow-up by: Ileene Hutchinson,  July 03, 2007 2:46 PM    Additional Follow-up for Phone Call   Has he ever been on lipitor?   Additional Follow-up by: Arlyss Repress NP,  July 03, 2007 3:17 PM    Additional Follow-up for Phone Call   patient hastried lipitor then zocor. he said he has not tried pravachol  patient  will pick up script  Additional Follow-up by: Tora Kindred MA,  July 03, 2007 4:48 PM    Prescriptions:  PRAVACHOL 40 MG TABS (PRAVASTATIN SODIUM) one by mouth every evening  #90 x 1   Entered by: Tora Kindred MA   Authorized by: Arlyss Repress NP   Signed by: Tora Kindred MA on 07/04/2007   Method used: Reprint   RxID: [id number]  PRAVACHOL 40 MG TABS (PRAVASTATIN SODIUM) one by mouth every evening  #90 x 1   Entered and Authorized by: Arlyss Repress NP   Signed by: Arlyss Repress NP on 07/03/2007   Method used: Print then Mail to Patient   RxID: [id number]  ZOCOR 20 MG TABS (SIMVASTATIN) one by mouth daily  #90 x  1   Entered and Authorized by: Arlyss Repress NP   Signed by: Arlyss Repress NP on 07/02/2007   Method used: Print then Mail to Patient   RxID: [id number]  ZOCOR 20 MG TABS (SIMVASTATIN) one by mouth daily  #0 x 0   Entered by: Ileene Hutchinson   Authorized by: Arlyss Repress NP   Signed by: Ileene Hutchinson on 07/02/2007   Method used: Print then Mail to Patient   RxID: [id number]  ZETIA 10 MG TABS (EZETIMIBE) one by mouth daily  #90 x 1   Entered by: Ileene Hutchinson   Authorized by: Arlyss Repress NP   Signed by: Ileene Hutchinson on 07/02/2007   Method used: Print then Mail to Patient   RxID: [id number]  ZOCOR 20 MG TABS (SIMVASTATIN) one by mouth daily  #0 x 0   Entered by: Ileene Hutchinson   Authorized by: Arlyss Repress NP   Signed by: Ileene Hutchinson on 07/02/2007   Method used: Print then Mail to Patient   RxID: [id number]  ZETIA 10 MG  TABS (EZETIMIBE) one by mouth daily  #90 x 1   Entered by: Ileene Hutchinson   Authorized by: Arlyss Repress NP   Signed by: Ileene Hutchinson on 07/02/2007   Method used: Print then Mail to Patient   RxID: [id number]

## 2007-07-10 NOTE — Unmapped (Signed)
Signed by   LinkLogic on 07/10/2007 at 19:43:08  Patient: [name]  Note: All result statuses are Final unless otherwise noted.    Tests: (1)  (History and Physical Report)    Order Note:     DATE OF DICTATION:  07/10/07    DATE OF ADMISSION: 07/10/07    REASON FOR ADMISSION:  Post ERCP observation.    HISTORY OF PRESENT ILLNESS:  The patient was admitted for ERCP, suspected   cystic mass in  the pancreas. ERCP was done and shows a slight kink in the pancreatic duct.   The bile duct  is normal.    MEDICATIONS:  Amiodarone, Vytorin, Glyburide, Metformin, Carvedilol, Levoxyl,   Coumadin,  Protonix.    PAST MEDICAL HISTORY:  Hypothyroidism, hypertension, diabetes, congestive   heart failure.    FAMILY HISTORY:  Positive for heart disease and diabetes.    SOCIAL HISTORY:   Doesn't smoke, doesn't drink.    REVIEW OF SYSTEMS:  No fever, no chest pain, no shortness of breath. No   blurred vision.  All other systems are normal.    ALLERGIES:  None.    PHYSICAL EXAMINATION;    HEENT: Shows no jaundice. Pupils equal, react to light.    CHEST: Clear.    HEART: S1, S2.    ABDOMEN: Soft, nontender. Liver, spleen, kidneys nonpalpable. Bowel sounds   heard  normally.    NEUROLOGIC: No sensory, no motor deficit. Deep tendon reflexes are normal.                  IMPRESSION; The patient has a slight suspicion of a pancreatic mass.  We will   get an  endoscopic ultrasound.          Order Note: Site: [facility]  [phone]  Admission Date/Time: 07/10/07   Discharge Date/Time: //   Unit #: [id number]  Location: [facility]6T2  Account #: [id number]  Primary Insurance: MEDICARE PART A   B  Admitting Diagnosis: BILIARY OBSTRUCTION  Report #: [id number]      Order Note: _________________________________  Signed byShelda Altes M.D. [place]       DM  D:  07/10/07 1312  T:  07/10/07 1720    This document is confidential medical information.  Unauthorized disclosure or   use of this information is prohibited by  law.  If you are not the intended   recipient of this document, please advise [place] by calling immediately   [phone].    Note: An exclamation mark (!) indicates a result that was not dispersed into   the flowsheet.  Document Creation Date: 07/10/2007 7:43 PM  _______________________________________________________________________    (1) Order result status: Final  Collection or observation date-time: 07/10/2007 13:12  Requested date-time: 07/10/2007 17:20  Receipt date-time:   Reported date-time:   Referring Physician:    Ordering Physician:   [facility] Reviewed In Hospital)  Specimen Source:   Source: Francesco Sor Order Number: [id number] TRANSCRIPTION  Lab site: [facility]

## 2007-07-10 NOTE — Unmapped (Signed)
Signed by   LinkLogic on 07/11/2007 at 07:55:58  Patient: [name]  Note: All result statuses are Final unless otherwise noted.    Tests: (1)  (Procedure Note)    Order Note:       DATE OF THE PROCEDURE:  07/10/2007.    PROCEDURES PERFORMED: ERCP, endoscopic sphincterotomy and placement of   endo-biliary  stent.  OPERATOR:   Beryl Meager, M.D.    SCOPE USED:   GIF-160.    ANESTHESIA:  General anesthesia.    CLINICAL NOTE:     [age]-year-old patient with a suspected pancreatic   mass.    FINDINGS: With the patient in the left lateral decubitus position, the   endoscope was  passed indirectly through the hypopharynx into the esophagus. The scope was   advanced  through the stomach and pyloric channel and into the duodenum.   The ampulla   was  visualized and found to be somewhat edematous.  Using a 4-French catheter,   loaded with a  0.035 Jag-wire, free cannulation of the bile duct was achieved.  The   guidewire was  withdrawn.  Contrast was injected.  The cystic duct was patent.  The   gallbladder was  normal.  Intrahepatic ducts were normal.   Common hepatic and common bile   ducts were  normal.   As we slowly withdrew the catheter under fluoroscopy, we saw the   pancreatic  duct and we inserted the catheter straight into the pancreatic duct which was   normal, but  there was a slight, very subtle kink in the head of the pancreas, suggesting   that there  might be a tumor.  A pancreatogram was achieved.  The procedure was then   terminated.    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    PLAN:   We will do an endoscopic ultrasound tomorrow.              Order Note: Site: [facility]  [phone]  Admission Date/Time: 07/10/07   Discharge Date/Time: //   Unit #: [id number]  Location: BN6T2  Account #: [id number]  Primary Insurance: MEDICARE PART A   B  Admitting Diagnosis: BILIARY OBSTRUCTION  Report #: [id number]      Order Note:  _________________________________  Signed byShelda Altes M.D. [place]       EB  D:  07/10/07 1311  T:  07/11/07 0723    This document is confidential medical information.  Unauthorized disclosure or   use of this information is prohibited by law.  If you are not the intended   recipient of this document, please advise [place] by calling immediately   [phone].    Note: An exclamation mark (!) indicates a result that was not dispersed into   the flowsheet.  Document Creation Date: 07/11/2007 7:55 AM  _______________________________________________________________________    (1) Order result status: Final  Collection or observation date-time: 07/10/2007 13:11  Requested date-time: 07/11/2007 07:23  Receipt date-time:   Reported date-time:   Referring Physician:    Ordering Physician:   [facility] Reviewed In Hospital)  Specimen Source:   Source: Francesco Sor Order Number: [id number] TRANSCRIPTION  Lab site: [facility]

## 2007-07-11 NOTE — Unmapped (Signed)
Signed by   LinkLogic on 07/11/2007 at 15:14:39  Patient: [name]  Note: All result statuses are Final unless otherwise noted.    Tests: (1)  (D/C Medication Reconciliation)    Order Note:     PROTONIX                           Take 40 MG  (PANTOPRAZOLE)                    EVERY DAY By Mouth    NON-FORMULARY TAB                  Take 1 EA  (VYTORIN 10/20 (N/F COMBO))       AT BEDTIME DAILY By Mouth    COUMADIN                           Take 5 MG  (WARFARIN SODIUM)                 1800 By Mouth    SYNTHROID                          Take 100 MCG  (LEVOTHYROXINE SODIUM)            DAILY@0700  By Mouth    COREG                              Take 12.5 MG  (CARVEDILOL)                      TWO TIMES A DAY By Mouth    NON-FORMULARY TAB                  Take 1 EA  (GLYBURIDE/METFORMIN 2.5/500)     EVERY MORNING By Mouth    NON-FORMULARY TAB                  Take 1 EA  (HYZAAR 50/12.5)                  EVERY DAY By Mouth      New Orders:  CALL DR. GRANDHI FOR ENDOSCOPIC ULTRASOUND [phone]    A copy of this report was provided to patient: [name], [name]            Order Note: Site: [facility]  [phone]  Admission Date/Time: 07/10/07   Discharge Date/Time: //   Unit #: [id number]  Location: BN6T2  Account #: [id number]  Primary Insurance: MEDICARE PART A   B  Admitting Diagnosis: BILIARY OBSTRUCTION  Report #: [id number]      Order Note: _________________________________  Signed by:            DC  D:  07/11/07 1449  T:  07/11/07 1449    This document is confidential medical information.  Unauthorized disclosure or   use of this information is prohibited by law.  If you are not the intended   recipient of this document, please advise [place] by calling immediately   [phone].    Note: An exclamation mark (!) indicates a result that was not dispersed into   the flowsheet.  Document Creation Date: 07/11/2007 3:14  PM  _______________________________________________________________________    (1) Order result status: Final  Collection or observation date-time:  07/11/2007 14:49  Requested date-time: 07/11/2007 14:49  Receipt date-time:   Reported date-time:   Referring Physician:    Ordering Physician:  [facility])  Specimen Source:   Source: Francesco Sor Order Number: [id number] TRANSCRIPTION  Lab site: [facility]

## 2007-07-20 LAB — PROTIME-INR
INR: 1.8
Protime: 18.2 s (ref 9.0–11.5)

## 2007-07-20 NOTE — Unmapped (Signed)
Signed by Tama Headings MA on 07/21/2007 at 09:26:38      Coumadin Management     Patient PT: 18.2 (07/20/2007 9:45:00 AM)  INR: 1.8 (07/20/2007 9:45:00 AM)  Target INR Range: 2.0-3.0  Duration Tx: indefinitely  Indication for Coumadin Therapy: Atrial Fibrillation  Current Coumadin Regimen one (5mg ) by mouth daily   New Coumadin Regimen one whole pill by mouth daily except for one and a half pills (7.5 mg) on Mondays and Thursdays  Next Protime in: 1 month    Follow-up for Test Results:   Follow-up by: [name] MD,  July 20, 2007 5:47 PM    New Medications:  COUMADIN 5 MG TABS (WARFARIN SODIUM) one whole pill by mouth daily except for one and a half pills (7.5 mg) on Mondays and Thursdays    Additional Follow-up for Test Results:   Action Taken: The patient/family/other verbalizes a clear understanding of the above plan.  Additional Follow-up by: Tama Headings MA,  July 21, 2007 9:26 AM

## 2007-07-20 NOTE — Unmapped (Signed)
Signed by [name] MD on 07/20/2007 at 15:49:13  Patient: [name]  Note: All result statuses are Final unless otherwise noted.    Tests: (1) PROTHROMBIN TIME WITH INR (IHK-7425)   INTERNATIONAL NORMALIZED RATIO (INR)                              1.8      INR REFERENCE INTERVAL APPLIES TO PATIENTS      NOT ON ANTICOAGULANT THERAPY:                 0.9- 1.1             SUGGESTED INR THERAPEUTIC RANGE FOR ORAL      ANTICOAGULANT THERAPY (STABLE ANTICOAGULATED      PATIENTS)                    ROUTINE THERAPY:                       2.0- 3.0                    RECURRENT MYOCARDIAL INFARCTION             OR MECHANICAL PROSTHETIC VALVES:       2.5- 3.5           PROTHROMBIN TIME     [H]  18.2 sec                    9.0-11.5    Note: An exclamation mark (!) indicates a result that was not dispersed into   the flowsheet.  Document Creation Date: 07/20/2007 3:02 PM  _______________________________________________________________________    (1) Order result status: Final  Collection or observation date-time: 07/20/2007 09:45  Requested date-time:   Receipt date-time: 07/20/2007 13:45  Reported date-time: 07/20/2007 14:00  Referring Physician:    Ordering Physician: [name] [facility])  Specimen Source: B  Source: Arline Asp Order Number: [id number]  Lab site: [facility] [place]      [address]      [place]  [place]  [address]

## 2007-07-27 NOTE — Unmapped (Signed)
Signed by [name] MD on 07/27/2007 at 11:39:41    Prescriptions:  OMEPRAZOLE 20 MG CPDR (OMEPRAZOLE) one by mouth daily  #90 x 3   Entered and Authorized by: [name] MD   Signed by: [name] MD on 07/27/2007   Method used: Print then Give to Patient   RxID: [id number]

## 2007-08-07 NOTE — Unmapped (Signed)
Signed by   LinkLogic on 08/10/2007 at 07:34:11  Patient: [name]  Note: All result statuses are Final unless otherwise noted.    Tests: (1)  (Procedure Note)    Order Note:       DATE OF THE PROCEDURE:  08/07/2007.    REFERRING PHYSICIANS:         [name], M.D. and Beryl Meager, M.D.    OPERATING PHYSICIAN:  Nav Dena Billet, M.D.    PROCEDURES PERFORMED:   EUS, radial and linear with fine needle aspiration   biopsies.    INDICATIONS FOR THE PROCEDURE:  Abnormal MRI of the abdomen which shows   possible  pancreatic uncinate process lesion, abnormal ERCP showing possible mass in the   head of  the pancreas.    ANESTHESIA:  The patient received a total of Versed 6 mg and Demerol 100 mg   and Benadryl  50 mg intravenous push in slow incremental doses, over the course of the   procedure, to  ensure adequate safety as well as the patient's comfort.    PROCEDURE AND FINDINGS:  Evaluation of the pancreas was performed with both a   linear and  radial echoendoscopes.  AP windows and subcarinal spaces were normal.   The   celiac  take-off was normal.   The body and tail of the pancreas were normal.   The   common bile  duct was identified and was not dilated at approximately 4.8 mm.  The head of   the  pancreas was unremarkable.  No ectasia of the pancreatic duct or the head of   the pancreas  was seen.  In the uncinate process, however, there appeared to be a slightly   prominent  duct, although no obvious masses were seen and using a 25 gauge needle FNA   needle, 2  passes were made and tissue was obtained for histopathology from the uncinate   process.  No abnormal lymphadenopathy was identified.  The patient tolerated the   procedure well.  The patient tolerated the procedure well.    ASSESSMENT:  1. Abnormal MRI.  2. Abnormal ERCP.    RECOMMENDATIONS:   No obvious lesions that would raise concerns for malignancy   were  identified on today's examination.  Certainly, the uncinate process duct   appears  slightly  prominent, but no obvious masses associated with it were visualized and I   suspect the  biopsies done today would be benign.  The patient has not had a CT scan of the   abdomen  and, therefore, CT scan with pancreatic protocol would be obtained.  He has   been advised  to follow-up with me in 4-6 weeks and to call me next week for biopsy results.    A  follow-up EUS in 6 months is recommended.              Order Note: Site: [facility]  [phone]  Admission Date/Time: 08/07/07   Discharge Date/Time: //   Unit #: [id number]  Location: BNEND  Account #: [id number]  Primary Insurance: MEDICARE PART A   B  Admitting Diagnosis: DUODENAL MASS  Report #: [id number]      Order Note: _________________________________  Signed byLanna Poche M.D. Laurena Slimmer  D:  08/07/07 2706  T:  08/10/07 2376    This document is confidential medical information.  Unauthorized disclosure or   use of this information is  prohibited by law.  If you are not the intended   recipient of this document, please advise [place] by calling immediately   [phone].    Note: An exclamation mark (!) indicates a result that was not dispersed into   the flowsheet.  Document Creation Date: 08/10/2007 7:34 AM  _______________________________________________________________________    (1) Order result status: Final  Collection or observation date-time: 08/07/2007 09:17  Requested date-time: 08/10/2007 07:14  Receipt date-time:   Reported date-time:   Referring Physician:    Ordering Physician:   [facility])  Specimen Source:   Source: Francesco Sor Order Number: [id number] TRANSCRIPTION  Lab site: [facility]

## 2007-08-10 NOTE — Unmapped (Signed)
Signed by   LinkLogic on 08/13/2007 at 08:13:40  Patient: [name]  Note: All result statuses are Final unless otherwise noted.    Tests: (1) Abdomen W/ Contrast 4401027 (ABDW)    Order Note:     CT SCAN OF THE ABDOMEN WITH IV AND ORAL CONTRAST (08/10/07):    INDICATION:   ABNORMAL PANCREAS ON ERCP.    TECHNIQUE:  MULTIPLE AXIAL IMAGES OF THE ABDOMEN WERE PERFORMED WITH IV AND   ORAL CONTRAST ACCORDING TO STANDARD DEPARTMENTAL PANCREATIC PROTOCOL.    COMPARISON:   ERCP DATED 07/10/07    FINDINGS:   THE LUNG BASES ARE CLEAR.    THERE ARE A FEW SCATTERED LOW ATTENUATION LESIONS IN THE LIVER WHICH ARE   SHARPLY CIRCUMSCRIBED WITH FLUID ATTENUATION COMPATIBLE WITH CYSTS.  THE LIVER   IS OTHERWISE NORMAL IN APPEARANCE.  THE SPLEEN, ADRENAL GLANDS AND KIDNEYS   ARE NORMAL IN APPEARANCE.    THERE IS FULLNESS IN THE REGION OF THE PANCREATIC HEAD WITH A SLIGHTLY BULBOUS   APPEARANCE, BEST SEEN ON SERIES 2 IMAGES 35 AND 36, MEASURING UP TO 3 CM IN   SIZE.  THIS AREA IS RELATIVELY ISODENSE TO THE PANCREAS BUT THERE IS NO   SIGNIFICANT FAT IN IT.    NO ADENOPATHY.  NO BOWEL WALL THICKENING.    BONE WINDOW EXAMINATION DEMONSTRATES NO AGGRESSIVE OSSEOUS LESIONS.    **IMPRESSION**    FULLNESS OF THE PANCREATIC HEAD WITH A SLIGHTLY BULBOUS APPEARANCE, SERIES 2   IMAGE 35.   ALTHOUGH NOT DEFINITE MASS IS IDENTIFED; THE BULBOUS APPEARANCE   AND  PRIOR ERCP FINDINGS OF 07/10/07, DOES RAISE THE POSSIBILITY OF AN   ISOINTENSE PANCREATIC MASS.  IF NOT ALREADY PERFORMED, MRCP AND MRI OF THE   ABDOMEN USING A PANCREATIC PROTOCOL MAY BE OF VALUE.               Order Note: Non-EMR Ordering Provider: Dutch Gray MD     Order Note: [facility]  [phone]  Rad #: [id number]  Unit #: [id number]  Location: [facility]  Account #: [id number]  Req #: [id number]  Order #: [id number]  Primary Insurance: MEDICARE PART A   B  Procedure: Abdomen W/ Contrast [id number]\\.brxam Date/Time: 08/10/07 0800  Admitting Diagnosis: ABDOMINAL PAIN 789.07  DIABETES 250.00  Reason for exam: ABD PAIN            Order Note: Dictated by:  Gregary Signs M.D. [place]   Signed by:    Gregary Signs M.D. [place]    08/13/07 0751      SF  D:  08/10/07 1133  T:  08/11/07 1241    This document is confidential medical information.  Unauthorized disclosure or   use of this information is prohibited by law.  If you are not the intended   recipient of this document, please advise [place] by calling immediately   [phone].  ! Abdomen W/ Contrast 2706237                              Result Below...        RESULT: Impression/Conclusion below  (R)    Note: An exclamation mark (!) indicates a result that was not dispersed into   the flowsheet.  Document Creation Date: 08/13/2007 8:13 AM  _______________________________________________________________________    (1) Order result status: Final  Collection or observation date-time: 08/10/2007 08:00  Requested date-time: 08/13/2007 07:51  Receipt date-time: 08/10/2007 08:00  Reported  date-time:   Referring Physician:    Ordering Physician:  Non-EMR Physician [facility])  Specimen Source:   Source: [name] Order Number: [id number] RADIOLOGY  Lab site: [facility]

## 2007-08-16 LAB — COMPREHENSIVE METABOLIC PANEL
A/G Ratio: 1.8 (ref 1.0–2.1)
ALT: 19 units/L (ref 9–60)
AST: 16 units/L (ref 10–35)
Albumin: 4.3 g/dL (ref 3.6–5.1)
Alkaline Phosphatase: 83 units/L (ref 40–115)
BUN: 23 mg/dL (ref 7–25)
CO2: 28 mmol/L (ref 21–33)
Calcium: 9.1 mg/dL (ref 8.6–10.2)
Chloride: 103 mmol/L (ref 98–110)
Creatinine: 1.2 mg/dL (ref 0.76–1.46)
GFR MDRD Af Amer: >60 mL/min (ref >=60)
GFR MDRD Non Af Amer: >60 mL/min (ref >=60)
Globulin, Total: 2.4 g/dL (ref 2.1–3.7)
Glucose: 141 mg/dL (ref 65–99)
Potassium: 4.1 mmol/L (ref 3.5–5.3)
Sodium: 139 mmol/L (ref 135–146)
Total Bilirubin: 0.4 mg/dL (ref 0.2–1.2)
Total Protein: 6.7 g/dL (ref 6.2–8.3)

## 2007-08-16 LAB — LIPID PANEL
Chol/HDL Ratio: 4.9 (ref <=5.0)
Cholesterol, Total: 161 mg/dL (ref 125–200)
HDL: 33 mg/dL (ref >=40)
LDL Cholesterol: 88 mg/dL (ref <130)
Triglycerides: 198 mg/dL (ref <150)

## 2007-08-16 LAB — T4, FREE: Free T4: 1.8 ng/dL (ref 0.8–1.8)

## 2007-08-16 LAB — HEMOGLOBIN A1C: Hemoglobin A1C: 6.9 % — ABNORMAL HIGH

## 2007-08-16 LAB — MICROALBUMIN (RANDOM UR)
Creatinine, Random Urine: 87 mL/min (ref 20–370)
Microalb / UCreat: 11 mg/g (ref <30)
Microalb, Ur: 1 mg/dL

## 2007-08-16 LAB — TSH: TSH: 2.14 microintl units/mL (ref 0.40–4.50)

## 2007-08-16 NOTE — Unmapped (Signed)
Signed by [name] MD on 08/17/2007 at 17:55:15  Patient: [name]  Note: All result statuses are Final unless otherwise noted.    Tests: (1) TEST AUTHORIZATION (QDL-1005)  ! TEST(S) ORDERED ON REQUISITION                              TSH  ! TEST CODE:                899  ! CLIENT CONTACT:           DR Newman Pies  !                           .      THE LABORATORY TESTING ON THIS PATIENT WAS VERBALLY REQUESTED      OR CONFIRMED BY THE ORDERING PHYSICIAN OR HIS OR HER AUTHORIZED      REPRESENTATIVE AFTER CONTACT WITH AN EMPLOYEE OF [employer].      FEDERAL REGULATIONS REQUIRE THAT WE MAINTAIN ON FILE WRITTEN       AUTHORIZATION FOR ALL LABORATORY TESTING.  ACCORDINGLY WE ARE ASKING       THAT THE ORDERING PHYSICIAN OR HIS OR HER AUTHORIZED REPRESENTATIVE       SIGN A COPY OF THIS REPORT AND PROMPTLY RETURN IT TO THE CLIENT       SERVICE REPRESENTATIVE.                    SIGNATURE:____________________________________________________                    Valinda Hoar NUMBER: [phone]                   REPORT COMMENT:      FASTING    Note: An exclamation mark (!) indicates a result that was not dispersed into   the flowsheet.  Document Creation Date: 08/17/2007 11:01 AM  _______________________________________________________________________    (1) Order result status: Final  Collection or observation date-time: 08/16/2007 09:20  Requested date-time:   Receipt date-time: 08/16/2007 09:31  Reported date-time: 08/17/2007 10:00  Referring Physician:    Ordering Physician: [name] [facility])  Specimen Source: *  Source: Arline Asp Order Number: [id number]  Lab site: [facility] [place]      [address]      [place]  [place]  [address]

## 2007-08-16 NOTE — Unmapped (Signed)
Signed by [name] MD on 08/16/2007 at 09:22:44      Reason for Visit   Chief Complaint: hypertension, hyperlipidemia    History from: patient    Allergies  No Known Allergies    Medications   COUMADIN 5 MG TABS (WARFARIN SODIUM) one whole pill by mouth daily except for one and a half pills (7.5 mg) on Mondays and Thursdays  PRAVACHOL 40 MG TABS (PRAVASTATIN SODIUM) one by mouth every evening  LISINOPRIL-HYDROCHLOROTHIAZIDE 20-12.5 MG TABS (LISINOPRIL-HYDROCHLOROTHIAZIDE) 1 by mouth daily  COREG 12.5 MG TABS (CARVEDILOL) Take one twice a day  LEVOTHYROXINE SODIUM 100 MCG TABS (LEVOTHYROXINE SODIUM) 1 by mouth daily  PROTONIX 40 MG TBEC (PANTOPRAZOLE SODIUM) Take one daily  SAW PALMETTO EXTR (SAW PALMETTO) 1 by mouth daily  GLUCOSAMINE SULFATE POWD (GLUCOSAMINE SULFATE)   PACERONE 200 MG TABS (AMIODARONE HCL) one and a half by mouth daily  CIALIS 5 MG TABS (TADALAFIL) one by mouth daily as needed  LIDEX 0.05 % CREA (FLUOCINONIDE) apply twice a day  GLUCOVANCE 2.5-500 MG TABS (GLYBURIDE-METFORMIN) one by mouth qam  ZETIA 10 MG TABS (EZETIMIBE) one by mouth daily  OMEPRAZOLE 20 MG CPDR (OMEPRAZOLE) one by mouth daily       Vital Signs:   Wt: 270 lbs.      Wt chg (lbs): 13  Pulse: 68 (regular)  BP: 134/84  Cuff size: large    Intake recorded by: Tora Kindred MA on Aug 16, 2007 9:05 AM        HPI Multiple Chronic   Condition(s): Diabetes, Hyperlipidemia, Hypertension, Hypothyroidism.  Comments: Dr Octaviano Glow took him of lisinopril for cough and put him on Hyzaar -- level 3    Current Status:   All problems reasonably well controlled and stable.  Home BP monitoring yes  Systolic Range: 140-159  Diastolic Range: 81-89    Home Glucose Monitoring: yes    Compliance with diet: fair  Compliance with exercise: fair  Compliance Comments:   glucose 170 fasting but 2 hr pp usually 140    ROS/Symptoms:   Patient denies any endocrine symptoms.  Patient denies any cardiovascular symptoms.  Patient complains of the following  respiratory symptoms: cough is gone off the lisinopril  Patient denies any respiratory symptoms.  Patient denies any GI symptoms.  Patient denies any neurologic symptoms.  Patient complains of the following musculoskeletal symptoms: occ neck feels stiff, rare ha  Patient denies any psychological symptoms.  Patient denies any general symptoms.    Past History  Social History: Tobacco Usage:non-smoker        Physical Examination:   BP: 134/  84   Blood Pressure updated by physician.    Physical Exam- Detail:   General Appearance: obese in nad  Respiratory: Respiration un-labored.  Lung fields clear to auscultation.  No wheezing, rales, rhonchi or pleural rub.  Neck: No thyromegaly.  No nodules, masses or tenderness.  Lymphatic: Areas palpated not enlarged:  cervical, supraclavicular.  Cardiac: S1 and S2 normal.  RRR without murmurs, rubs, gallops.  No JVD.  Vascular: no edema  Abdomen: No masses or tenderness. Bowel sounds active x4 quad.  Liver and spleen are without tenderness or enlargement.  No hernias.  Musculoskeletal: Gait coordinated and smooth.  Digits are without clubbing or cyanosis.           New Medications:  DIOVAN HCT 160-25 MG TABS (VALSARTAN-HYDROCHLOROTHIAZIDE)   PACERONE 200 MG TABS (AMIODARONE HCL) one by mouth daily  Preventive Maintenance       Coordinating Care Providers   PCP Name: Airika Alkhatib MD            Prescriptions:  PACERONE 200 MG TABS (AMIODARONE HCL) one by mouth daily  #90 x 3   Entered and Authorized by: [name] MD   Signed by: [name] MD on 08/16/2007   Method used: Print then Give to Patient   RxID: [id number]  DIOVAN HCT 160-25 MG TABS (VALSARTAN-HYDROCHLOROTHIAZIDE)   #90 x 3   Entered and Authorized by: [name] MD   Signed by: [name] MD on 08/16/2007   Method used: Print then Give to Patient   RxID: [id number]      Assessment and Plan  Status of Existing Problems:  Assessed HYPOTHYROIDISM NOS as unchanged - check lab - [name]  MD  Assessed DIABETES MELLITUS as deteriorated - check lab - [name] MD  Assessed HYPERTENSION as unchanged - change to diovan hct for price - [name] MD  Assessed HYPERLIPIDEMIA as unchanged - check lab - [name] MD  Assessed ATRIAL FIBRILLATION as unchanged - just off coumadin -- recheck after back on 2 weeks  has to stop for 3 days for tooth this month, too - [name] MD    Medications   New Prescriptions/Refills:  PACERONE 200 MG TABS (AMIODARONE HCL) one by mouth daily  #90 x 3, 08/16/2007, [name] MD  DIOVAN HCT 160-25 MG TABS (VALSARTAN-HYDROCHLOROTHIAZIDE)  #90 x 3, 08/16/2007, [name] MD    New medications:  DIOVAN HCT 160-25 MG TABS  Start date: 08/16/2007  PACERONE 200 MG TABS -- one by mouth daily  Start date: 08/16/2007      Today's Orders   99214 - Ofc Vst, Est Level IV [CPT-99214]  CMP   (METAPNL) (10231) [CPT-80053]  Lipid Panel w/Reflex to Direct LDL (14852) [CPT-80061]  Urine Microalbumin w CR, (UMALB) (6517) [CPT-82043]  Hemoglobin  A1C   (GLYCO) (496)  [CPT-83036]

## 2007-08-16 NOTE — Unmapped (Signed)
Signed by [name] MD on 08/19/2007 at 21:24:57  Patient: Consuello Closs  Note: All result statuses are Final unless otherwise noted.    Tests: (1) TSH, 3RD GENERATION (QDL-899)    TSH, 3RD GENERATION       2.14 mIU/L                  0.40-4.50            REPORT COMMENT:      FASTING    Note: An exclamation mark (!) indicates a result that was not dispersed into   the flowsheet.  Document Creation Date: 08/17/2007 8:10 PM  _______________________________________________________________________    (1) Order result status: Final  Collection or observation date-time: 08/16/2007 09:20  Requested date-time:   Receipt date-time: 08/16/2007 09:31  Reported date-time: 08/17/2007 19:00  Referring Physician:    Ordering Physician: [name] [facility])  Specimen Source: S  Source: Arline Asp Order Number: [id number]  Lab site: [facility] [place]      [address]      [place]  [place]  [address]

## 2007-08-16 NOTE — Unmapped (Signed)
Signed by [name] MD on 08/19/2007 at 21:24:57  Patient: [name]  Note: All result statuses are Final unless otherwise noted.    Tests: (1) T4, FREE (QDL-866)    T4, FREE                  1.8 ng/dL                   0.6-3.0    Note: An exclamation mark (!) indicates a result that was not dispersed into   the flowsheet.  Document Creation Date: 08/17/2007 8:10 PM  _______________________________________________________________________    (1) Order result status: Final  Collection or observation date-time: 08/16/2007 09:20  Requested date-time:   Receipt date-time: 08/16/2007 09:31  Reported date-time: 08/17/2007 19:00  Referring Physician:    Ordering Physician: [name] [facility])  Specimen Source: S  Source: Arline Asp Order Number: [id number]  Lab site: [facility] [place]      [address]      [place]  [place]  [address]

## 2007-08-16 NOTE — Unmapped (Signed)
Signed by [name] MD on 08/16/2007 at 17:15:42  Patient: [name]  Note: All result statuses are Final unless otherwise noted.    Tests: (1) COMPREHENSIVE METABOLIC PANEL W/EGFR (QDL-10231)    GLUCOSE              [H]  141 mg/dL                   93-81                  FASTING REFERENCE INTERVAL    UREA NITROGEN (BUN)       23 mg/dL                    8-29    CREATININE                1.2 mg/dL                   0.76-1.46   eGFR NON-AFR. AMERICAN                              >60 mL/min/1.23m2           > OR = 60   eGFR AFRICAN AMERICAN                              >60 mL/min/1.78m2           > OR = 60   BUN/CREATININE RATIO (calc)                              NOT APPLICABLE              6-22      BUN/CREATININE RATIO IS NOT REPORTED WHEN THE BUN      AND CREATININE VALUES ARE WITHIN NORMAL LIMITS.    SODIUM                    139 mmol/L                  135-146    POTASSIUM                 4.1 mmol/L                  3.5-5.3    CHLORIDE                  103 mmol/L                  98-110    CARBON DIOXIDE            28 mmol/L                   21-33    CALCIUM                   9.1 mg/dL                   9.3-71.6    PROTEIN, TOTAL            6.7 g/dL                    9.6-7.8    ALBUMIN  4.3 g/dL                    6.2-1.3    GLOBULIN (calc)           2.4 g/dL                    0.8-6.5   ALBUMIN/GLOBULIN RATIO (calc)                              1.8                         1.0-2.1    BILIRUBIN, TOTAL          0.4 mg/dL                   7.8-4.6    ALKALINE PHOSPHATASE      83 U/L                      40-115    AST                       16 U/L                      10-35    ALT                       19 U/L                      9-60            REPORT COMMENT:      FASTING    Note: An exclamation mark (!) indicates a result that was not dispersed into   the flowsheet.  Document Creation Date: 08/16/2007 4:31  PM  _______________________________________________________________________    (1) Order result status: Final  Collection or observation date-time: 08/16/2007 09:20  Requested date-time:   Receipt date-time: 08/16/2007 09:31  Reported date-time: 08/16/2007 16:00  Referring Physician:    Ordering Physician: [name] [facility])  Specimen Source: S  Source: Arline Asp Order Number: [id number]  Lab site: [facility] [place]      [address]        [facility]  [phone]      -----------------    The following non-numeric lab results were dispersed to  the flowsheet even though numeric results were expected:      BUN/CREATININE RATIO (calc), NOT APPLICABLE

## 2007-08-16 NOTE — Unmapped (Signed)
Signed by [name] MD on 08/16/2007 at 17:15:42  Patient: [name]  Note: All result statuses are Final unless otherwise noted.    Tests: (1) LIPID PANEL WITH REFLEX TO DIRECT LDL ([id number]-65784)    TRIGLYCERIDES        [H]  198 mg/dL                   <696    CHOLESTEROL, TOTAL        161 mg/dL                   295-284    HDL CHOLESTEROL      [L]  33 mg/dL                    > OR = 40   LDL-CHOLESTEROL (calc)                              88 mg/dL                    <132             DESIRABLE RANGE <100 MG/DL FOR PATIENTS WITH CHD OR      DIABETES AND <70 MG/DL FOR DIABETIC PATIENTS WITH      KNOWN HEART DISEASE.          CHOL/HDLC RATIO (calc)                              4.9                         < OR = 5.0    Note: An exclamation mark (!) indicates a result that was not dispersed into   the flowsheet.  Document Creation Date: 08/16/2007 4:31 PM  _______________________________________________________________________    (1) Order result status: Final  Collection or observation date-time: 08/16/2007 09:20  Requested date-time:   Receipt date-time: 08/16/2007 09:31  Reported date-time: 08/16/2007 16:00  Referring Physician:    Ordering Physician: [name] [facility])  Specimen Source: S  Source: Arline Asp Order Number: [id number]  Lab site: [facility] [place]      [address]      [place]  [place]  [address]

## 2007-08-16 NOTE — Unmapped (Signed)
Signed by [name] MD on 08/17/2007 at 06:24:33  Patient: [name]  Note: All result statuses are Final unless otherwise noted.    Tests: (1) HEMOGLOBIN A1c (QDL-496)   HEMOGLOBIN A1c (% of total Hgb)                         [H]  6.9 %      NON-DIABETIC: <6.0%                   REPORT COMMENT:      FASTING    Note: An exclamation mark (!) indicates a result that was not dispersed into   the flowsheet.  Document Creation Date: 08/17/2007 12:15 AM  _______________________________________________________________________    (1) Order result status: Final  Collection or observation date-time: 08/16/2007 09:20  Requested date-time:   Receipt date-time: 08/16/2007 09:31  Reported date-time: 08/17/2007 00:00  Referring Physician:    Ordering Physician: [name] [facility])  Specimen Source: B  Source: Arline Asp Order Number: [id number]  Lab site: [facility] [place]      [address]      [place]  [place]  [address]

## 2007-08-16 NOTE — Unmapped (Signed)
Signed by Tora Kindred MA on 08/16/2007 at 10:50:13    Prescriptions:  DIOVAN HCT 160-25 MG TABS (VALSARTAN-HYDROCHLOROTHIAZIDE)   #28 x 0   Entered by: Tora Kindred MA   Authorized by: [name] MD   Signed by: Tora Kindred MA on 08/16/2007   Method used: Samples Given   RxID: [id number]

## 2007-08-16 NOTE — Unmapped (Signed)
Signed by [name] MD on 08/17/2007 at 08:30:55  Patient: [name]  Note: All result statuses are Final unless otherwise noted.    Tests: (1) MICROALBUMIN, RANDOM URINE (W/CREATININE) (QDL-6517)   CREATININE, RANDOM URINE                              87 mg/dL                    96-295    MICROALBUMIN              1.0 mg/dL      REFERENCE RANGE      NOT ESTABLISHED     MICROALBUMIN/CREATININE RATIO, RANDOM URINE                              11 mcg/mg creat             <30             THE ADA (DIABETES CARE 26:S94-S98, 2003) DEFINES      ABNORMALITIES IN ALBUMIN EXCRETION AS FOLLOWS:             CATEGORY         RESULT (MCG/MG CREATININE)             NORMAL                    <30      MICROALBUMINURIA         30-299       CLINICAL ALBUMINURIA   > OR = 300             THE ADA RECOMMENDS THAT AT LEAST TWO OF THREE      SPECIMENS COLLECTED WITHIN A 3-6 MONTH PERIOD BE      ABNORMAL BEFORE CONSIDERING A PATIENT TO BE      WITHIN A DIAGNOSTIC CATEGORY.            REPORT COMMENT:      FASTING    Note: An exclamation mark (!) indicates a result that was not dispersed into   the flowsheet.  Document Creation Date: 08/17/2007 7:48 AM  _______________________________________________________________________    (1) Order result status: Final  Collection or observation date-time: 08/16/2007 09:20  Requested date-time:   Receipt date-time: 08/16/2007 09:31  Reported date-time: 08/17/2007 07:00  Referring Physician:    Ordering Physician: [name] [facility])  Specimen Source: R  Source: Arline Asp Order Number: [id number]  Lab site: [facility] [place]      [address]      [place]  [facility]  [phone]      -----------------    The following lab values were dispersed to the flowsheet  with no units conversion:      CREATININE, RANDOM URINE, 87 MG/DL, (F)  expected units: mL/min    MICROALBUMIN/CREATININE RATIO, RANDOM URINE, 11 MCG/MG CREAT, (F)  expected   units: mg/g

## 2007-08-17 NOTE — Unmapped (Signed)
Signed by Tora Kindred MA on 08/17/2007 at 09:10:39         Follow-up for Test Results:   Comments: Please print the new blood work orders and ask the lab to run those tests on the blood that they already have.    Follow-up by: [name] MD,  Aug 17, 2007 6:25 AM    Additional Follow-up for Test Results:   Action Taken: Phone Call Completed- patient informed of results  Comments: done  Additional Follow-up by: Tora Kindred MA,  Aug 17, 2007 9:10 AM

## 2007-08-19 NOTE — Unmapped (Signed)
Signed by [name] MD on 08/19/2007 at 21:26:27            [name], M.D.  [address] [place], [place] [address]  Phone [phone]  Fax [phone]      Aug 19, 2007      [place]  [address] [place], [place] [address]                                                                                           RE:  TEST RESULTS   [name]--11-08-1940)         Dear [name]:      The following is an interpretation of your most recent tests.  Please take note of any instructions provided.    Electrolyte studies:  Normal         Glucose test abnormal: 141   Kidney function studies:  Normal    Liver function studies:  Normal    Lipid panel:  Good          Triglyceride: 198   Cholesterol: 161   LDL: 88   HDL: 33   Chol/HDL%:  4.9   Thyroid studies:  Good        TSH: 2.14      Diabetic studies:  Good          HgbA1c: 6.9      Microalbumin:  Good        Microalbumin: 1.0        Microalbumin/Creatinine Ratio: 11 MCG/MG CREAT          Additional Comments: When treating high cholesterol, our primary goal is to lower the bad cholesterol (LDL) under 100.  With a prior history of heart artery problems or diabetes, we like to get the LDL under 70 if possible.  The triglycerides should ideally be under 150 -- somewhat higher is OK under some circumstances.  The good cholesterol (HDL) should be at least one-fifth of the total cholesterol.    Your cholesterol profile looks good.  Please continue your current cholesterol medication and check another blood test in 4 to 6 months.    The hemoglobin A1c is reported as a percentage -- it indicates what your average blood sugar has been for the last three months.  Well-controlled diabetes is indicated by a hemoglobin A1c under 7 percent, ideally under 6.5 percent.  Yours is 6.9.  Your hemoglobin A1c shows that your glucose control has been good for the last 3 months.  Please continue your current treatment and check another hemoglobin A1c in 4  to 6 months.    Your thyroid blood test is good.  You should continue your current dose of thyroid medication and check another blood test in 6 to 12 months.    Your urine microalbumin is normal, meaning that you are not at increased risk for kidney or eye damage due to your diabetes.  You should recheck this test in one year.         Sincerely,        [name] MD

## 2007-08-21 NOTE — Unmapped (Signed)
Signed by [name] MD on 08/21/2007 at 00:00:00  Gastroenterology      Imported By: Deon Pilling 11/29/2007 08:00:42    _____________________________________________________________________    External Attachment:    Please see [employer] EMR for this document.

## 2007-09-26 LAB — PROTIME-INR
INR: 2.9
Protime: 27.9 s (ref 9.0–11.5)

## 2007-09-26 NOTE — Unmapped (Signed)
Signed by Verdie Shire MD on 09/26/2007 at 14:01:42    Phone Note   Other Incoming Call    Call From: [facility]  Caller's Name: Gwynneth Albright Phone #: [phone]  Call For: [facility]  Summary of call: INR 2.9   PT 27.9  Initial call taken by: Derwood Kaplan MA,  September 26, 2007 11:51 AM

## 2007-09-26 NOTE — Unmapped (Signed)
Signed by Sheryle Spray MA on 09/27/2007 at 08:50:54      Coumadin Management     Patient PT: 27.9 (09/26/2007 9:11:00 AM)  INR: 2.9 (09/26/2007 9:11:00 AM)  Target INR Range: 2.0-3.0  Duration Tx: indefinitely  Indication for Coumadin Therapy: Atrial Fibrillation  Current Coumadin Regimen one whole pill by mouth daily except for one and a half pills (7.5 mg) on Mondays and Thursdays  New Coumadin Regimen one whole pill by mouth daily except for one and a half pills (7.5 mg) on Mondays and Thursdays  Next Protime in: 1 month    Follow-up for Test Results:   Action taken: PT/INR is OK.  No change in dosage of coumadin.  Follow-up by: [name] MD,  September 26, 2007 8:34 PM      Additional Follow-up for Test Results:   Action Taken: Left patient message on answering machine  Additional Follow-up by: Sheryle Spray MA,  September 27, 2007 8:50 AM

## 2007-09-26 NOTE — Unmapped (Signed)
Signed by [name] MD on 09/26/2007 at 20:33:45  Patient: [name]  Note: All result statuses are Final unless otherwise noted.    Tests: (1) PROTHROMBIN TIME WITH INR (ZOX-0960)   INTERNATIONAL NORMALIZED RATIO (INR)                              2.9      INR REFERENCE INTERVAL APPLIES TO PATIENTS      NOT ON ANTICOAGULANT THERAPY:                 0.9- 1.1             SUGGESTED INR THERAPEUTIC RANGE FOR ORAL      ANTICOAGULANT THERAPY (STABLE ANTICOAGULATED      PATIENTS)                    ROUTINE THERAPY:                       2.0- 3.0                    RECURRENT MYOCARDIAL INFARCTION             OR MECHANICAL PROSTHETIC VALVES:       2.5- 3.5           PROTHROMBIN TIME     [H]  27.9 sec                    9.0-11.5    Note: An exclamation mark (!) indicates a result that was not dispersed into   the flowsheet.  Document Creation Date: 09/26/2007 11:58 AM  _______________________________________________________________________    (1) Order result status: Final  Collection or observation date-time: 09/26/2007 09:11  Requested date-time:   Receipt date-time: 09/26/2007 11:15  Reported date-time: 09/26/2007 11:00  Referring Physician:    Ordering Physician: [name] [facility])  Specimen Source: B  Source: Arline Asp Order Number: [id number]  Lab site: [facility] [place]      [address]      [place]  [place]  [address]

## 2007-10-08 LAB — RENAL FUNCTION PANEL W/EGFR
Albumin: 4.4 g/dL (ref 3.6–5.1)
BUN: 24 mg/dL (ref 7–25)
CO2: 31 mmol/L (ref 21–33)
Calcium: 9.3 mg/dL (ref 8.6–10.2)
Chloride: 102 mmol/L (ref 98–110)
Creatinine: 1.2 mg/dL (ref 0.76–1.46)
GFR MDRD Af Amer: >60 mL/min (ref >=60)
GFR MDRD Non Af Amer: >60 mL/min (ref >=60)
Glucose: 93 mg/dL (ref 65–99)
Phosphorus: 3.2 mg/dL (ref 2.1–4.3)
Potassium: 3.9 mmol/L (ref 3.5–5.3)
Sodium: 139 mmol/L (ref 135–146)

## 2007-10-08 LAB — TSH: TSH: 2.28 microintl units/mL (ref 0.40–4.50)

## 2007-10-08 LAB — T4, FREE: Free T4: 1.3 ng/dL (ref 0.8–1.8)

## 2007-10-08 NOTE — Unmapped (Signed)
Signed by [name] MD on 10/09/2007 at 06:58:13  Patient: [name]  Note: All result statuses are Final unless otherwise noted.    Tests: (1) T4, FREE (QDL-866)    T4, FREE                  1.3 ng/dL                   0.9-8.1    Note: An exclamation mark (!) indicates a result that was not dispersed into   the flowsheet.  Document Creation Date: 10/08/2007 8:58 PM  _______________________________________________________________________    (1) Order result status: Final  Collection or observation date-time: 10/08/2007 14:48  Requested date-time:   Receipt date-time: 10/08/2007 14:58  Reported date-time: 10/08/2007 20:00  Referring Physician:    Ordering Physician: [name] [facility])  Specimen Source: S  Source: Arline Asp Order Number: [id number]  Lab site: [facility] [place]      [address]      [place]  [place]  [phone]

## 2007-10-08 NOTE — Unmapped (Signed)
Signed by [name] MD on 10/09/2007 at 06:58:13  Patient: [name]  Note: All result statuses are Final unless otherwise noted.    Tests: (1) RENAL FUNCTION PANEL W/EGFR (YNW-29562)    GLUCOSE                   93 mg/dL                    13-08                  FASTING REFERENCE INTERVAL    UREA NITROGEN (BUN)       24 mg/dL                    6-57    CREATININE                1.2 mg/dL                   0.76-1.46   eGFR NON-AFR. AMERICAN                              >60 mL/min/1.58m2           > OR = 60   eGFR AFRICAN AMERICAN                              >60 mL/min/1.12m2           > OR = 60   BUN/CREATININE RATIO (calc)                              NOT APPLICABLE              6-22      BUN/CREATININE RATIO IS NOT REPORTED WHEN THE BUN      AND CREATININE VALUES ARE WITHIN NORMAL LIMITS.    SODIUM                    139 mmol/L                  135-146    POTASSIUM                 3.9 mmol/L                  3.5-5.3    CHLORIDE                  102 mmol/L                  98-110    CARBON DIOXIDE            31 mmol/L                   21-33    CALCIUM                   9.3 mg/dL                   8.4-69.6   PHOSPHATE (AS PHOSPHORUS)                              3.2 mg/dL  2.1-4.3    ALBUMIN                   4.4 g/dL                    6.3-0.1    Note: An exclamation mark (!) indicates a result that was not dispersed into   the flowsheet.  Document Creation Date: 10/08/2007 8:28 PM  _______________________________________________________________________    (1) Order result status: Final  Collection or observation date-time: 10/08/2007 14:48  Requested date-time:   Receipt date-time: 10/08/2007 14:58  Reported date-time: 10/08/2007 20:00  Referring Physician:    Ordering Physician: [name] [facility])  Specimen Source: S  Source: Arline Asp Order Number: [id number]  Lab site: [facility] [place]      [address]      [place]  [facility]   [address]      -----------------    The following non-numeric lab results were dispersed to  the flowsheet even though numeric results were expected:      BUN/CREATININE RATIO (calc), NOT APPLICABLE

## 2007-10-08 NOTE — Unmapped (Signed)
Signed by [name] MD on 10/09/2007 at 06:58:13  Patient: [name]  Note: All result statuses are Final unless otherwise noted.    Tests: (1) TSH, 3RD GENERATION (QDL-899)    TSH, 3RD GENERATION       2.28 mIU/L                  0.40-4.50    Note: An exclamation mark (!) indicates a result that was not dispersed into   the flowsheet.  Document Creation Date: 10/08/2007 8:58 PM  _______________________________________________________________________    (1) Order result status: Final  Collection or observation date-time: 10/08/2007 14:48  Requested date-time:   Receipt date-time: 10/08/2007 14:58  Reported date-time: 10/08/2007 20:00  Referring Physician:    Ordering Physician: [name] [facility])  Specimen Source: S  Source: Arline Asp Order Number: [id number]  Lab site: [facility] [place]      [address]      [place]    [phone]

## 2007-10-08 NOTE — Unmapped (Signed)
Signed by [name] MD on 10/08/2007 at 00:00:00  EKG      Imported By: Burgess Estelle 10/09/2007 07:42:21    _____________________________________________________________________    External Attachment:    Please see [employer] EMR for this document.

## 2007-10-08 NOTE — Unmapped (Signed)
Signed by [name] MD on 10/08/2007 at 13:54:20      Reason for Visit   Chief Complaint: here for pre-op    History from: patient    Allergies  No Known Allergies    Medications   COUMADIN 5 MG TABS (WARFARIN SODIUM) one whole pill by mouth daily except for one and a half pills (7.5 mg) on Mondays and Thursdays  PRAVACHOL 40 MG TABS (PRAVASTATIN SODIUM) one by mouth every evening  DIOVAN HCT 160-25 MG TABS (VALSARTAN-HYDROCHLOROTHIAZIDE)   COREG 12.5 MG TABS (CARVEDILOL) Take one twice a day  LEVOTHYROXINE SODIUM 100 MCG TABS (LEVOTHYROXINE SODIUM) 1 by mouth daily  PROTONIX 40 MG TBEC (PANTOPRAZOLE SODIUM) Take one daily  SAW PALMETTO EXTR (SAW PALMETTO) 1 by mouth daily  GLUCOSAMINE SULFATE POWD (GLUCOSAMINE SULFATE)   PACERONE 200 MG TABS (AMIODARONE HCL) one by mouth daily  CIALIS 5 MG TABS (TADALAFIL) one by mouth daily as needed  LIDEX 0.05 % CREA (FLUOCINONIDE) apply twice a day  GLUCOVANCE 2.5-500 MG TABS (GLYBURIDE-METFORMIN) one by mouth qam  ZETIA 10 MG TABS (EZETIMIBE) one by mouth daily  OMEPRAZOLE 20 MG CPDR (OMEPRAZOLE) one by mouth daily      Vital Signs:   Wt: 280 lbs.      Wt chg (lbs): 10  Temperature: 98.3  degrees F  oral  Pulse: 72 (regular)  BP: 150/88  Cuff size: large    Intake recorded by: Tora Kindred MA on October 08, 2007 1:07 PM  Date of procedure: 10/11/2007  Data Reviewed: dr. Enid Baas    History of Present Illness   left heel pain, planned blood patch injection under general anesthesia    no recent illnesses    Past History  Past Medical History (reviewed - no changes required):  Atrial Fibrillation, Hyperlipidemia, Hypertension, Dilated Cardiomyopathy, Diabetes Type II, Hypothyroidism, sleep apnea  Surgical History (reviewed - no changes required):  Septoplasty: ., UPPP, Hernia Surgery: right inguinal, No problems with anesthesia    Family History (reviewed - no changes required): Mother - dm2, htn  Sister - dm2, cad  Brother - dm2, cad, htn  Social History (reviewed - no  changes required): Marital Status: married,   Employment Status: retired,   Patient Lives at: home,   Support System: excellent  Exercise: not due to foot,   Caffeine per Day: 0  Seatbelt Use: 100 % of the time  Drug Use: none  Tobacco Usage:non-smoker    Review of Systems  General: Complains of weight gain.   Eyes: last eye exam 2008 -- overdue  Ears/Nose/Throat: Denies any specific issues at this time.   Cardiovascular: Denies chest pains, dyspnea at rest, peripheral edema, orthopnea, syncope. some dyspnea with a lot of exertion only  Respiratory: Denies any specific issues at this time.   Gastrointestinal: Denies diarrhea, constipation, melena, hematochezia. gets occ dysphagia with solid food, hx stricture/stretching  Genitourinary: Complains of nocturia, erectile dysfunction. Denies hematuria. nocturia x 2 every night  Musculoskeletal: Complains of see HPI. occ neck soreness  Skin: Denies suspicious lesions. sees Dr Guinevere Scarlet  Neurologic: headache due to neck pain  Psychiatric: Denies any specific issues at this time.   Endocrine: glucoses this am 174, 2 hr after breakfast 130s  Heme/Lymphatic: Complains of abnormal bruising. on coumadin  Allergic/Immunologic: Complains of seasonal allergy symptoms.       Physical Examination:   BP: 150/  88   Blood Pressure updated by physician.    Physical Exam- Detail:  General Appearance: obese in nad  Skin: No suspicious rashes or lesions.  Eyes: Sclera white, conjunctiva without injection and pallor.  PERRLA.  EOMI  Ears: No lesions.  Tympanic membranes translucent, non-bulging.  Canal walls pink, without discharge.  Hearing grossly intact. some wax  Nose/Face: Mucosa and turbinates pink, septum midline. No polyps, no discharge, no lesions.  Oropharynx: Normal appearance.  No erythema, exudate or mass. No tonsillar swelling.  Oral Cavity: Gums pink, good dentition.  Oral mucosa and tongue without lesions.  Respiratory: Respiration un-labored.  Lung fields clear to  auscultation.  No wheezing, rales, rhonchi or pleural rub.  Neck: No thyromegaly.  No nodules, masses or tenderness.  Lymphatic: Areas palpated not enlarged:  cervical, supraclavicular.  Chest: No masses, lesions, or tenderness.  Cardiac: S1 and S2 normal.  RRR without murmurs, rubs, gallops.  No JVD.  Vascular: No carotid bruits.  No edema or varicosities.  Diabetic Foot Check:   done  Abdomen: No masses or tenderness. Bowel sounds active x4 quad.  Liver and spleen are without tenderness or enlargement.  No hernias.  Neurologic: no tremor  dtrs normal  Psychiatric: Judgement and insight are within normal limits.  Alert and oriented x3.  No mood disorders noted, appropriate affect.      In office Procedures & Tests     EKG Interpretation:   Rate: 62  Rhythm: atrial fibrillation  Axis: normal  ST Segments: nonspecific STT wave changes  Q Waves: none  Conduction: normal intervals  EKG Comments: counterclockwise rotation  Comparison to prior EKG: same         New Problems:  HEEL PAIN, LEFT (ICD-729.5)  New Medications:  DIOVAN HCT 320-25 MG TABS (VALSARTAN-HYDROCHLOROTHIAZIDE) one by mouth daily      Preventive Maintenance     Diabetic Foot Exam:   09/24/2007     Coordinating Care Providers   PCP Name: Newman Pies MD            Prescriptions:  DIOVAN HCT 320-25 MG TABS (VALSARTAN-HYDROCHLOROTHIAZIDE) one by mouth daily  #90 x 3   Entered and Authorized by: [name] MD   Signed by: [name] MD on 10/08/2007   Method used: Print then Give to Patient   RxID: [id number]  DIOVAN HCT 320-25 MG TABS (VALSARTAN-HYDROCHLOROTHIAZIDE) one by mouth daily  #30 x 0   Entered and Authorized by: [name] MD   Signed by: [name] MD on 10/08/2007   Method used: Print then Give to Patient   RxID: [id number]      Assessment and Plan     Problems   Status of Existing Problems:  Assessed HEEL PAIN, LEFT as deteriorated - planned injection - [name] MD  Assessed DIABETES MELLITUS as unchanged - [name] MD  Assessed HYPERTENSION as deteriorated - med dose changed,se disc, check renal - [name] MD  Assessed ATRIAL FIBRILLATION as unchanged - ekg done - [name] MD  Assessed CHF as improved - stable - [name] MD  New Problems:  Dx of HEEL PAIN, LEFT (ICD-729.5)     Medications   New Prescriptions/Refills:  DIOVAN HCT 320-25 MG TABS (VALSARTAN-HYDROCHLOROTHIAZIDE) one by mouth daily  #90 x 3, 10/08/2007, [name] MD  DIOVAN HCT 320-25 MG TABS (VALSARTAN-HYDROCHLOROTHIAZIDE) one by mouth daily  #30 x 0, 10/08/2007, [name] MD    Today's Orders   Ophthalmology Consult [phone]  Renal Function Panel   (KIDNEY) [phone]) [CPT-80069]  EKG w/ Interpretation [CPT-93000]  TSH   (  TSH) (899) [CPT-84443]  T4 free  (FT4) (866)  [CPT-84439]  99244 - Ofc Consult, Level IV [CPT-99244]    Pre OP Assessment:   Benefits of procedure outweigh risks; agree with planned procedure/anesthesia.  Patient is medically stable for the proposed procedure.  Estimated A.S.A Class: 3) Severe disease process which limits activity but is not incapacitating    Recommendation/Plan:   Recommendations/Plan: Eat and drink nothing after midnight the night before the planned procedure except for amiodarone, diovan hct, coreg and protonix with just a sip of water each on the morning of surgery.   Stop coumadin today, resume after procedure.  Risks of surgery pertaining to above identified problems discussed with pt/guardian.  Pt/guardian agrees to follow through with above prescribed testing and/or medication changes (and appropriate testing f/u) prior to procedure.  Pt/guardian given copy of this consultation for surgeon.  Consultation faxed to surgeon's office.

## 2007-10-09 NOTE — Unmapped (Signed)
Signed by Tora Kindred MA on 10/09/2007 at 10:17:46         Follow-up for Test Results:   Comments: Please fax the patient's recent laboratory test results to the appropriate facility.    Follow-up by: [name] MD,  October 09, 2007 6:58 AM    Additional Follow-up for Test Results:   Comments: faxed labs  Additional Follow-up by: Tora Kindred MA,  October 09, 2007 10:17 AM

## 2007-10-11 NOTE — Unmapped (Signed)
Signed by [name] MD on 10/11/2007 at 00:00:00  Posterior Achilles Tendinitis      Imported By: Deon Pilling 01/30/2008 15:13:35    _____________________________________________________________________    External Attachment:    Please see [employer] EMR for this document.

## 2007-10-31 NOTE — Unmapped (Signed)
Signed by Sheryle Spray MA on 11/01/2007 at 12:47:17    Phone Note   Patient Call  Caller: patient  Department: IM - General  Call for: Harish Bram    Reason for Call: refill medication  Summary of Call: pt needs rx for viagra .  he will pick it up when ready. [phone]     Initial call taken by: Ileene Hutchinson,  October 31, 2007 10:28 AM      New Medications:  VIAGRA 50 MG TAB (SILDENAFIL CITRATE) one by mouth daily as needed    Follow-up for Phone Call   We have him down as cialis.  Please clarify.  Follow-up by: [name] MD,  November 01, 2007 8:57 AM    Additional Follow-up for Phone Call   said he has taken viagra in the past...cialis is not effective for him  Additional Follow-up by: Sheryle Spray MA,  November 01, 2007 11:39 AM    Additional Follow-up for Phone Call   Dante Gang not seen in the EMR.  50mg  dose rx.  Additional Follow-up by: [name] MD,  November 01, 2007 12:29 PM    Additional Follow-up for Phone Call   pt wanted to pick up rx..... reprinted and placed upfront to pick up  Additional Follow-up by: Sheryle Spray MA,  November 01, 2007 12:45 PM    Prescriptions:  VIAGRA 50 MG TAB (SILDENAFIL CITRATE) one by mouth daily as needed  #10 x 6   Entered by: Sheryle Spray MA   Authorized by: [name] MD   Signed by: Sheryle Spray MA on 11/01/2007   Method used: Reprint   RxID: [id number]  VIAGRA 50 MG TAB (SILDENAFIL CITRATE) one by mouth daily as needed  #10 x 6   Entered and Authorized by: [name] MD   Signed by: [name] MD on 11/01/2007   Method used: Telephoned to ...        RxID: [id number]

## 2007-11-02 NOTE — Unmapped (Signed)
Signed by [name] MD on 11/02/2007 at 00:00:00  Cardiac       Imported By: Deon Pilling 12/19/2007 14:21:43    _____________________________________________________________________    External Attachment:    Please see [employer] EMR for this document.

## 2007-11-02 NOTE — Unmapped (Signed)
Signed by Tora Kindred MA on 11/02/2007 at 12:42:01    Phone Note   Call from Pharmacy    Pharmacy Name: [facility]  Caller: Tim  Pharmacy Phone Number: [phone]  Call for: [facility]  Summary of call: Requesting new rx for Viagra 100mg , pharmacy states this will save the pt about $75 and the pt can cut the tab in half.  Initial call taken by: Barnetta Hammersmith,  November 02, 2007 11:49 AM      New Medications:  VIAGRA 100 MG TABS (SILDENAFIL CITRATE) one-half of a pill daily prn    Follow-up for Phone Call   COUMADIN 5 MG TABS (WARFARIN SODIUM) one whole pill by mouth daily except for one and a half pills (7.5 mg) on Mondays and Thursdays  PRAVACHOL 40 MG TABS (PRAVASTATIN SODIUM) one by mouth every evening  DIOVAN HCT 320-25 MG TABS (VALSARTAN-HYDROCHLOROTHIAZIDE) one by mouth daily  COREG 12.5 MG TABS (CARVEDILOL) Take one twice a day  LEVOTHYROXINE SODIUM 100 MCG TABS (LEVOTHYROXINE SODIUM) 1 by mouth daily  PROTONIX 40 MG TBEC (PANTOPRAZOLE SODIUM) Take one daily  SAW PALMETTO EXTR (SAW PALMETTO) 1 by mouth daily  GLUCOSAMINE SULFATE POWD (GLUCOSAMINE SULFATE)   PACERONE 200 MG TABS (AMIODARONE HCL) one by mouth daily  VIAGRA 100 MG TABS (SILDENAFIL CITRATE) one-half of a pill daily prn  LIDEX 0.05 % CREA (FLUOCINONIDE) apply twice a day  GLUCOVANCE 2.5-500 MG TABS (GLYBURIDE-METFORMIN) one by mouth qam  ZETIA 10 MG TABS (EZETIMIBE) one by mouth daily  OMEPRAZOLE 20 MG CPDR (OMEPRAZOLE) one by mouth daily     Follow-up by: Tora Kindred MA,  November 02, 2007 12:42 PM    Prescriptions:  VIAGRA 100 MG TABS (SILDENAFIL CITRATE) one-half of a pill daily prn  #10 x 6   Entered and Authorized by: [name] MD   Signed by: [name] MD on 11/02/2007   Method used: Telephoned to ...        RxID: [id number]

## 2007-11-05 NOTE — Unmapped (Signed)
Signed by [name] MD on 11/05/2007 at 00:00:00  Ophthalmology      Imported By: Deon Pilling 12/24/2007 15:14:48    _____________________________________________________________________    External Attachment:    Please see [facility] EMR for this document.

## 2007-11-22 NOTE — Unmapped (Signed)
Signed by [name] MD on 11/22/2007 at 00:00:00  DR COITH      Imported By: Burgess Estelle 01/17/2008 14:19:56    _____________________________________________________________________    External Attachment:    Please see [facility] EMR for this document.

## 2007-12-05 LAB — PROTIME-INR
INR: 2.6
Protime: 25.3 s (ref 9.0–11.5)

## 2007-12-05 NOTE — Unmapped (Signed)
Signed by Tora Kindred MA on 12/07/2007 at 08:46:05      Coumadin Management     Patient PT: 25.3 (12/05/2007 9:06:00 AM)  INR: 2.6 (12/05/2007 9:06:00 AM)  Target INR Range: 2.0-3.0  Duration Tx: indefinitely  Indication for Coumadin Therapy: Atrial Fibrillation  Current Coumadin Regimen one whole pill by mouth daily except for one and a half pills (7.5 mg) on Mondays and Thursdays  New Coumadin Regimen one whole pill by mouth daily except for one and a half pills (7.5 mg) on Mondays and Thursdays  Next Protime in: 1 month    Follow-up for Test Results:   Action taken: PT/INR is OK.  No change in dosage of coumadin.  Follow-up by: [name] MD,  December 05, 2007 7:23 PM      Additional Follow-up for Test Results:   Comments: left message for patient to call   Additional Follow-up by: Tora Kindred MA,  December 06, 2007 11:28 AM    Additional Follow-up for Test Results:   Comments: .discussed with patient. n  Additional Follow-up by: Tora Kindred MA,  December 07, 2007 8:46 AM

## 2007-12-05 NOTE — Unmapped (Signed)
Signed by [name] MD on 12/05/2007 at 19:23:11  Patient: [name]  Note: All result statuses are Final unless otherwise noted.    Tests: (1) PROTHROMBIN TIME-INR ([id number])    INR                  [H]  2.6      Reference Range                     0.9-1.1      Moderate-intensity Warfarin Therapy 2.0-3.0      Higher-intensity Warfarin Therapy   3.0-4.0            PT                   [H]  25.3 sec                    9.0-11.5    Note: An exclamation mark (!) indicates a result that was not dispersed into   the flowsheet.  Document Creation Date: 12/05/2007 2:21 PM  _______________________________________________________________________    (1) Order result status: Final  Collection or observation date-time: 12/05/2007 09:06  Requested date-time:   Receipt date-time: 12/05/2007 13:31  Reported date-time: 12/05/2007 14:00  Referring Physician:    Ordering Physician: [name] [facility])  Specimen Source: B  Source: Arline Asp Order Number: [id number] [phone]  Lab site: [facility] [place]      [address]      [place]  [address]

## 2008-01-01 NOTE — Unmapped (Signed)
Signed by Tama Headings MA on 01/01/2008 at 12:08:49    PHONE NOTE - Patient Call    Call back at Home Phone: [phone]  Caller: patient  Call for: [facility]    Reason for Call: refill medication. Needs rf for 90day mail order 1) Pravastatin 40mg  2) Glyburide/Metformin 2.5/500mg     Call when ready [phone]      Initial call taken by: Barnetta Hammersmith,  January 01, 2008 9:12 AM      FOLLOW UP  Left message to notify pt  &  rx's  up front for pt pick up  phone call completed  Follow-up by: Tama Headings MA,  January 01, 2008 12:08 PM    Prescriptions:  GLUCOVANCE 2.5-500 MG TABS (GLYBURIDE-METFORMIN) one by mouth qam  #90 x 1   Entered by: Tama Headings MA   Authorized by: [name] MD   Signed by: Tama Headings MA on 01/01/2008   Method used: Reprint   RxID: [id number]  PRAVACHOL 40 MG TABS (PRAVASTATIN SODIUM) one by mouth every evening  #90 x 1   Entered by: Tama Headings MA   Authorized by: [name] MD   Signed by: Tama Headings MA on 01/01/2008   Method used: Reprint   RxID: [id number]  GLUCOVANCE 2.5-500 MG TABS (GLYBURIDE-METFORMIN) one by mouth qam  #90 x 1   Entered and Authorized by: [name] MD   Signed by: [name] MD on 01/01/2008   Method used: Telephoned to ...        RxID: [id number]  PRAVACHOL 40 MG TABS (PRAVASTATIN SODIUM) one by mouth every evening  #90 x 1   Entered and Authorized by: [name] MD   Signed by: [name] MD on 01/01/2008   Method used: Telephoned to ...        RxID: [id number]

## 2008-01-03 ENCOUNTER — Inpatient Hospital Stay

## 2008-01-03 NOTE — Unmapped (Signed)
Signed by Tora Kindred MA on 01/04/2008 at 13:23:40         Follow-up for Test Results:   Comments: The esophagus looked thickened.  The radiologist recommended having an upper endoscopy.  I would like to talk to either Dr Keturah Shavers (who did his EGD in 2007) or Dr Dutch Gray (who did his endoscopic [place] May 2009) about this.  Discussed with patient -- hewill be calling Dr Gwyneth Revels to schedle EGD.  Action taken: call patient to inform of results  Follow-up by: [name] MD,  January 03, 2008 9:12 PM    Additional Follow-up for Test Results:   Comments: left message to have dr.   Dena Billet call  Additional Follow-up by: Tora Kindred MA,  January 04, 2008 12:08 PM    Additional Follow-up for Test Results:   Comments: Discussed with Dr Dena Billet -- sideviewing scope used May 2009, would not see esophagus problems easily.  Esophageal thickening on Ct, recommend EGD.  Additional Follow-up by: [name] MD,  January 04, 2008 12:23 PM    New Problems:  ? of ESOPHAGEAL DISORDER (ICD-530.9)

## 2008-01-03 NOTE — Unmapped (Signed)
Signed by   LinkLogic on 01/03/2008 at 17:00:19  Patient: [name]  Note: All result statuses are Final unless otherwise noted.    Tests: (1) CT-CARDIAC WITH CALCIUM SCORE [phone])    Order NotePricilla Handler Order Number: [id number]    Non-EMR Ordering Provider: Kathi Der     Order Note:     *** VERIFIED [facility]  Reason:  427.31, A-FIB, R/O THROMBOSIS  Dict.Staff: Odie Sera [phone]    Verified By: Odie Sera         Ver: 01/03/08   5:00 pm  Exams:  CT-CARDIAC WITH CALCIUM SCORE    CARDIAC CT  dated 01/03/2008.    INDICATION: Preoperative for pulmonary vein ablation procedure.    COMPARISON:  None    TECHNIQUE:    Multidetector noncontrast CT images were obtained through the  heart with prospective ECG gating for Coronary calcium scoring.  Multidetector CT images were then obtained through the heart  post contrast with prospective ECG gating. A test bolus  injection of 20 cc Isovue 370 followed x 20 cc of saline 7  cc/sec was administered to calculate delay and volume of  contrast. 1 tablet of sublingual nitroglycerin was administered.  80 cc of Isovue-370 was then administered at 7 cc/sec followed  by 60 cc chaser of 30% dilute contrast. Min dose technique was  employed. 1 minute delayed images were obtained through the left  atrial appendage. Examination was reviewed on a separate  [employer] and reviewed in axial,  MPR, MIP, and volume rendered formats. Please note that the  heart rate varied between 53 to 57 beats per minute.    FINDINGS:    The Agatston calcium score was calculated and was 87.    LEFT ATRIUM AND PULMONARY VEINS:    The pulmonary venous anatomy was standard.    A right superior and a right inferior pulmonary vein is  identified. The right inferior pulmonary vein demonstrates few  very small orificial branches    A left superior and a left inferior pulmonary vein identified.  No Orificial branches are noted from the left pulmonary  vein.    No evidence of pulmonary vein stenosis. Pulmonary vein  measurements at each pulmonary vein orifice are: right superior  pulmonary vein 2.3 x 1.3 cm, right inferior pulmonary vein 1.9 x  1.6 cm,  left superior pulmonary vein 2.3 x 1.6 cm, and left  inferior pulmonary vein 2 x 1.4 cm.    No evidence of left atrial appendage thrombus. The left atrial  appendage travels superiorly and then anteriorly just lateral to  the pulmonary outflow tract.   Please note that the first obtuse  marginal branch is immediately anterior to the base of the left  atrial appendage.    The left atrium is 4.1 cm in diameter.  Left atrial volume is 26  mL. An Accessory small left atrial appendage is also  incidentally identified.    MORPHOLOGY:  The ventricles are normal in size without evidence  for dilation or wall thinning.   There is a small pericardial  effusion. There is no evidence for pericardial thickening or  calcification.  There is no valvular calcification or  thickening.  The aortic valve is trileaflet .    CORONARY ARTERIES:  There is right coronary artery dominance.  The origins of the coronary arteries are from their respective  coronary cusps.      The right coronary artery arises from the  right coronary cusp.  It is patent throughout its course and gives rise to the  posterior descending artery. It contains minimal calcified  plaque in its proximal portion which does not cause luminal  narrowing. Incidental note is made of a separate origin of the  conus branch from the right coronary cusp.    The right coronary artery gives rise to the sinoatrial nodal  branch coronary artery.    The left main coronary artery arises from the left coronary  cusp. It is patent. It divides into left anterior descending and  left circumflex coronary arteries.    The left anterior descending coronary artery is patent and  contains minimal calcified and noncalcified plaque in its  proximal portion adjacent to the origin of D1 and D2  branches.  The plaque results in minimal luminal irregularity without  evidence of stenosis. There are 3 diagonal branches. There is  calcified plaque at the origin of D1 and D2 branches. It is  difficult to assess the amount of narrowing caused by the  calcified plaque at the origin of small D1 branch due to the  small size of this vessel. Mild narrowing is caused by the  calcified plaque at the origin of D2 branch.    There is step-off at the distal left anterior descending artery  related to cardiac motion.    The circumflex coronary artery is widely patent. It contains  minimal calcified plaque. It gives rise to 2 large and tortuous  first and second obtuse marginal branches, the distal obtuse  marginal branch is a somewhat small but patent.    CARDIAC FUNCTION: The cardiac function could not be assessed as  prospective dating was utilized and a single cardiac phase was  acquired.    NONCARDIAC FINDINGS:    There is a 4 mm groundglass opacity along the right major  fissure on image 11, series 5. Dependent atelectasis is present.  The mediastinal images do not demonstrate any abnormality. There  is dilation and mild wall thickening of the esophagus throughout  its imaged course. A 9 mm low density lesion is noted within the  left lobe of the liver on image 41, series 6. It is consistent  with a cyst. Imaged bones do not demonstrate focal suspicious  lytic or blastic lesions.    IMPRESSION:    Agatston calcium score of 87.    No evidence of pulmonary vein stenosis.Standard pulmonary venous  anatomy.    Tiny orificial branches in the right inferior pulmonary vein.    No evidence of a left atrial appendage thrombus. The left atrial  appendage travels superior and anterior to rest lateral to the  pulmonary outflow tract. Please note that the first obtuse  marginal branch is immediately anterior to the base of the left  atrial appendage.      Standard coronary anatomy with right coronary dominance.    Minimal  calcified and noncalcified plaque in coronary  circulation with no evidence of significant stenosis. Please  note that there is calcified plaque at the origin of D1. It is  difficult to assess the marked narrowing caused that this plaque  as the D1 branch is small. Mild narrowing at the origin of D2  caused by calcified plaque.    Groundglass opacity along the right major fissure. Consider  dedicated chest CT in 12 months to ensure stability.    Hepatic cyst.    Dilation and mild wall thickening of the esophagus throughout  its course. Although  this could be related to nondistention  consider further evaluation with upper GI or endoscopy.  **** end of result ****    Order Note:   EMR Routing to: Mearl Latin, MD - copy to - [id number]    Note: An exclamation mark (!) indicates a result that was not dispersed into   the flowsheet.  Document Creation Date: 01/03/2008 5:00 PM  _______________________________________________________________________    (1) Order result status: Final  Collection or observation date-time: 01/03/2008 12:02:00  Requested date-time: 01/03/2008 10:42:00  Receipt date-time:   Reported date-time: 01/03/2008 17:00:14  Referring Physician: Morley Kos  Ordering Physician:  Non-EMR Physician [facility])  Specimen Source:   Source: QRS  Filler Order Number: [id number]  Lab site: [facility]

## 2008-01-21 NOTE — Unmapped (Signed)
Signed by [name] MD on 01/21/2008 at 00:00:00  Cardiac       Imported By: Burgess Estelle 05/19/2008 13:44:44    _____________________________________________________________________    External Attachment:    Please see [employer] EMR for this document.

## 2008-01-31 NOTE — Unmapped (Signed)
Signed by [name] MD on 01/31/2008 at 00:00:00  Cardiac       Imported By: Doyle Askew 03/19/2008 11:13:45    _____________________________________________________________________    External Attachment:    Please see [employer] EMR for this document.

## 2008-02-15 NOTE — Unmapped (Signed)
Signed by   LinkLogic on 02/19/2008 at 07:43:11  Patient: [name]  Note: All result statuses are Final unless otherwise noted.    Tests: (1)  (Procedure Note)    Order Note:     CONSULTATION/PROCEDURE REPORT    DATE OF CONSULTATION/PROCEDURE:   02/15/2008.    HISTORY OF PRESENT ILLNESS:    The patient is a 67 year old white male seen at   the  request of Dr. Newman Pies for abnormal CT scan.    PAST MEDICAL HISTORY:  1.   Obstructive sleep apnea.  2.   Atrial fibrillation.  Congestive heart failure.  He has had a mini-Maze   procedure  and he was cardioverted.  Currently on amiodarone.  3.   Elevated cholesterol.  On Vytorin in the past.  4.   Hypertension.  On Diovan, carvedilol.  5.   Hypothyroidism.  On Levoxyl.  6.   Elevated lipids.  On pravastatin.  7.   History of diabetes mellitus.  On glyburide and metformin.  8.   History of polyps 2002.  In 2005, he had tubular adenomas. Currently, he   has no  constipation, diarrhea or bleeding.  9.   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 The patient had ERCP and EUS   with biopsy  in 07/2007, both of which were negative.  10.  The patient recently had series of CTs and MRI scan and was found to have   some  thickening of his esophagus and the patient is referred for EGD.  He did have   an  EGD in 02/2006 showing a hiatal hernia and he was dilated at that time.    Currently,  he has some slight trouble with his swallowing, but no hematemesis or melena.    No  pain.  His weight has been stable.  He has been using omeprazole once a day.    ALLERGIES:    None.    SOCIAL HISTORY:   Tobacco - none.  Alcohol - none.  He is married.    FAMILY HISTORY:    Mother hypertension, ASHD.  Father hypertension, ASHD.    Brother and  Sister diabetes.    OPERATIONS:    As above.    CURRENT MEDICATIONS:     As above.    REVIEW OF SYSTEMS:      No fevers, chest pain, shortness of breath, blurred   vision,  depression, back pain, headaches, skin rashes, nosebleeds, leg swelling, or    weight loss.    PHYSICAL EXAMINATION:  Shows a well-developed, well-nourished white male in no   acute  distress.    HEENT:     Eyes - PERRLA.  ENT not remarkable.    NECK:    Neck is supple with no abnormally enlarged nodes or thyroid.    LUNGS:    Clear to P&A.    CARDIOVASCULAR EXAM:    Cardiovascular shows no murmurs, gallops, clicks, or   rubs.    ABDOMEN:    Abdomen is flat and soft.  Bowel sounds are active. There is no   tenderness,  masses or organomegaly.    BACK:     Back shows no CVA tenderness.    EXTREMITIES:    Extremities show no edema or deformity.    NEUROLOGIC EXAMINATION:   The patient is oriented times three and appropriate.    PROCEDURE PERFORMED:  The patient underwent panendoscopy and esophageal   dilatation on  02/15/2008.  ANESTHESIA:  The patient received a total of Demerol 75 mg and Versed 5 mg   intravenous  push in slow incremental doses, over the course of the procedure, to ensure   adequate  safety as well as the patient's comfort.    DESCRIPTION OF THE PROCEDURE:    The Olympus GIF-160 pediatric video   gastroscope passed  easily into the esophagus. The esophagus appeared to be within normal limits   in the upper  and middle thirds.   There is a moderate sized hiatal hernia.   The remainder   of the  stomach and duodenum appeared to be normal. A #18 CRE dilator passed easily.     The scope  was withdrawn.    FINAL IMPRESSION:  1.   Hiatal hernia.  2.   Otherwise normal esophagus, stomach and duodenum.    RECOMMENDATIONS:      The patient should continue present medications and will   see Dr.  Newman Pies at his regular appointment.                Order Note: Site: [facility]  [phone]  Admission Date/Time: 02/15/08   Discharge Date/Time: //   Unit #: [id number]  Location: [facility]  Account #: [id number]  Primary Insurance: MEDICARE PART A   B  Admitting Diagnosis: THICKNESS OF ESOPHAGITIS  Report #: [id number]      Order Note:     Order Note:  _________________________________  Signed by:    Amedeo Plenty MD      EB  D:  02/15/08 1000  T:  02/19/08 0710    This document is confidential medical information.  Unauthorized disclosure or   use of this information is prohibited by law.  If you are not the intended   recipient of this document, please advise [place] by calling immediately   [phone].    Note: An exclamation mark (!) indicates a result that was not dispersed into   the flowsheet.  Document Creation Date: 02/19/2008 7:43 AM  _______________________________________________________________________    (1) Order result status: Final  Collection or observation date-time: 02/15/2008 10:00  Requested date-time: 02/19/2008 07:10  Receipt date-time:   Reported date-time:   Referring Physician:    Ordering Physician:  Reviewed [facility])  Specimen Source:   Source: Francesco Sor Order Number: [id number] TRANSCRIPTION  Lab site: [facility]

## 2008-02-20 NOTE — Unmapped (Signed)
Signed by [name] MD on 02/20/2008 at 00:00:00  Cardiac       Imported By: Deon Pilling 04/07/2008 13:00:47    _____________________________________________________________________    External Attachment:    Please see [employer] EMR for this document.

## 2008-03-31 NOTE — Unmapped (Signed)
Signed by Tora Kindred MA on 03/31/2008 at 17:27:51    PHONE NOTE - Patient Call    Call back at Home Phone: [phone]  Caller: patient  Call for: dr. Newman Pies    Reason for Call: pt needs refill on carvedilol mg 12.5 mg two times a day. he needs a mail a way. he is completely out and wants to know if there are any samples he can have. he will pick up when ready.  glyburide/metorm 2.5 - 500 two times a day. needs this for mail a away also      Initial call taken by: Johnell Comings,  March 31, 2008 10:07 AM    Current Allergies:   NKA      FOLLOW UP  Phone in both to [facility] or [facility] locally -- each is $4 for 30 days.  I can write mail-aways when I see him in two weeks.  Follow-up by: [name] MD,  March 31, 2008 12:29 PM    ADDITIONAL FOLLOW UP  called prescription into pharmacy   patient advised  Follow-up by: Tora Kindred MA,  March 31, 2008 5:27 PM    Prescriptions:  GLUCOVANCE 2.5-500 MG TABS (GLYBURIDE-METFORMIN) one by mouth qam  #60 x 0   Entered and Authorized by: [name] MD   Signed by: [name] MD on 03/31/2008   Method used: Telephoned to ...        RxID: [id number]  COREG 12.5 MG TABS (CARVEDILOL) Take one twice a day  #60 x 0   Entered and Authorized by: [name] MD   Signed by: [name] MD on 03/31/2008   Method used: Telephoned to ...        RxID: [id number]

## 2008-04-15 LAB — MICROALBUMIN (RANDOM UR)
Creatinine, Random Urine: 178 mL/min (ref 20–370)
Microalb / UCreat: 5 mg/g (ref <30)
Microalb, Ur: 0.9 mg/dL

## 2008-04-15 LAB — LIPID PANEL
Chol/HDL Ratio: 5.3 (ref <=5.0)
Cholesterol, Total: 163 mg/dL (ref 125–200)
HDL: 31 mg/dL (ref >=40)
LDL Cholesterol: 95 mg/dL (ref <130)
Triglycerides: 186 mg/dL (ref <150)

## 2008-04-15 LAB — COMPREHENSIVE METABOLIC PANEL
A/G Ratio: 1.8 (ref 1.0–2.1)
ALT: 18 U/L (ref 9–60)
AST: 15 U/L (ref 10–35)
Albumin: 4.4 g/dL (ref 3.6–5.1)
Alkaline Phosphatase: 83 U/L (ref 40–115)
BUN/Creatinine Ratio: 20 (ref 6–22)
BUN: 26 mg/dL — ABNORMAL HIGH (ref 7–25)
CO2: 29 mmol/L (ref 21–33)
Calcium: 9 mg/dL (ref 8.6–10.2)
Chloride: 102 mmol/L (ref 98–110)
Creatinine: 1.3 mg/dL (ref 0.76–1.46)
GFR MDRD Af Amer: >60 mL/min (ref >=60)
GFR MDRD Non Af Amer: 55 mL/min — ABNORMAL LOW (ref >=60)
Globulin, Total: 2.4 g/dL (ref 2.1–3.7)
Glucose: 147 mg/dL — ABNORMAL HIGH (ref 65–99)
Potassium: 4.3 mmol/L (ref 3.5–5.3)
Sodium: 140 mmol/L (ref 135–146)
Total Bilirubin: 0.5 mg/dL (ref 0.2–1.2)
Total Protein: 6.8 g/dL (ref 6.2–8.3)

## 2008-04-15 LAB — T4, FREE: Free T4: 1.6 ng/dL (ref 0.8–1.8)

## 2008-04-15 LAB — OFFICE VISIT LAB RESULTS
Bilirubin Urine: NEGATIVE
Blood, UA: NEGATIVE
Fecal Occult Bld: NEGATIVE
Glucose, UA: NEGATIVE
Ketones, UA: NEGATIVE
Leukocytes, UA: NEGATIVE
Nitrite, UA: NEGATIVE
Protein, UA: NEGATIVE
Specific Gravity, UA: 1.01
Urobilinogen, UA: 2
pH, UA: 6.5

## 2008-04-15 LAB — PSA, TOTAL AND FREE: PSA: 0.6 ng/mL (ref <=4.0)

## 2008-04-15 LAB — HEMOGLOBIN A1C: Hemoglobin A1C: 7.2 % — ABNORMAL HIGH

## 2008-04-15 LAB — TSH: TSH: 1.5 u[IU]/mL (ref 0.40–4.50)

## 2008-04-15 NOTE — Unmapped (Signed)
Signed by [name] MD on 04/16/2008 at 08:08:45  Patient: [name]  Note: All result statuses are Final unless otherwise noted.    Tests: (1) HEMOGLOBIN A1c (QDL-496)   HEMOGLOBIN A1c (% of total Hgb)                         [H]  7.2 %      NON-DIABETIC: <6.0%           Note: An exclamation mark (!) indicates a result that was not dispersed into   the flowsheet.  Document Creation Date: 04/16/2008 5:15 AM  _______________________________________________________________________    (1) Order result status: Final  Collection or observation date-time: 04/15/2008 14:15  Requested date-time:   Receipt date-time: 04/15/2008 14:31  Reported date-time: 04/16/2008 05:00  Referring Physician:    Ordering Physician: [name]  Specimen Source: B  Source: Arline Asp Order Number: [id number]  Lab site: [facility] [place]      [address]      [place]  [place]  [address]

## 2008-04-15 NOTE — Unmapped (Signed)
Signed by [name] MD on 04/16/2008 at 20:37:17  Patient: [name]  Note: All result statuses are Final unless otherwise noted.    Tests: (1) MICROALBUMIN, RANDOM URINE (W/CREATININE) (QDL-6517)   CREATININE, RANDOM URINE                              178 mg/dL                   19-379    MICROALBUMIN              0.9 mg/dL      Reference Range      Not established   MICROALBUMIN/CREATININE RATIO, RANDOM URINE                              5 mcg/mg creat              <30             The ADA defines abnormalities in albumin      excretion as follows:             Category         Result (mcg/mg creatinine)             Normal                    <30      Microalbuminuria         30-299       Clinical albuminuria   > OR = 300             The ADA recommends that at least two of three      specimens collected within a 3-6 month period be      abnormal before considering a patient to be      within a diagnostic category.    Note: An exclamation mark (!) indicates a result that was not dispersed into   the flowsheet.  Document Creation Date: 04/16/2008 9:16 AM  _______________________________________________________________________    (1) Order result status: Final  Collection or observation date-time: 04/15/2008 14:15  Requested date-time:   Receipt date-time: 04/15/2008 14:31  Reported date-time: 04/16/2008 09:00  Referring Physician:    Ordering Physician: [name] [facility])  Specimen Source: R  Source: Arline Asp Order Number: [id number]  Lab site: [facility] [place]      [address]      [place]  [facility]  [address]      -----------------    The following lab values were dispersed to the flowsheet  with no units conversion:      CREATININE, RANDOM URINE, 178 MG/DL, (F)  expected units: mL/min    MICROALBUMIN/CREATININE RATIO, RANDOM URINE, 5 MCG/MG CREAT, (F)  expected   units: mg/g

## 2008-04-15 NOTE — Unmapped (Signed)
Signed by [name] MD on 04/16/2008 at 08:08:45  Patient: [name]  Note: All result statuses are Final unless otherwise noted.    Tests: (1) T4, FREE (QDL-866)    T4, FREE                  1.6 ng/dL                   1.6-1.0    Note: An exclamation mark (!) indicates a result that was not dispersed into   the flowsheet.  Document Creation Date: 04/16/2008 1:14 AM  _______________________________________________________________________    (1) Order result status: Final  Collection or observation date-time: 04/15/2008 14:15  Requested date-time:   Receipt date-time: 04/15/2008 14:31  Reported date-time: 04/16/2008 01:00  Referring Physician:    Ordering Physician: [name] [facility])  Specimen Source: S  Source: Arline Asp Order Number: [id number]  Lab site: [facility] [place]      [address]      [place]  [place]  [address]

## 2008-04-15 NOTE — Unmapped (Signed)
Signed by [name] MD on 04/16/2008 at 08:08:15  Patient: [name]  Note: All result statuses are Final unless otherwise noted.    Tests: (1) LIPID PANEL WITH REFLEX TO DIRECT LDL (GLO-75643)    TRIGLYCERIDES        [H]  186 mg/dL                   <329    CHOLESTEROL, TOTAL        163 mg/dL                   518-841    HDL CHOLESTEROL      [L]  31 mg/dL                    > OR = 40   LDL-CHOLESTEROL (calc)                              95 mg/dL                    <660             DESIRABLE RANGE <100 MG/DL FOR PATIENTS WITH CHD OR      DIABETES AND <70 MG/DL FOR DIABETIC PATIENTS WITH      KNOWN HEART DISEASE.          CHOL/HDLC RATIO (calc)                         [H]  5.3                         < OR = 5.0    Note: An exclamation mark (!) indicates a result that was not dispersed into   the flowsheet.  Document Creation Date: 04/15/2008 11:29 PM  _______________________________________________________________________    (1) Order result status: Final  Collection or observation date-time: 04/15/2008 14:15  Requested date-time:   Receipt date-time: 04/15/2008 14:31  Reported date-time: 04/15/2008 23:00  Referring Physician:    Ordering Physician: [name] [facility])  Specimen Source: S  Source: Arline Asp Order Number: [id number]  Lab site: [facility] [place]      [address]      [place]  [place]  [address]

## 2008-04-15 NOTE — Unmapped (Signed)
Signed by [name] MD on 04/16/2008 at 08:08:45  Patient: [name]  Note: All result statuses are Final unless otherwise noted.    Tests: (1) PSA, TOTAL (QDL-5363)    PSA, TOTAL                0.6 ng/mL                   < OR = 4.0      THIS TEST WAS PERFORMED USING THE SIEMENS (BAYER)      CHEMILUMINESCENT METHOD. VALUES OBTAINED FROM      DIFFERENT ASSAY METHODS CANNOT BE USED      INTERCHANGEABLY. PSA LEVELS, REGARDLESS OF      VALUE, SHOULD NOT BE INTERPRETED AS ABSOLUTE      EVIDENCE OF THE PRESENCE OR ABSENCE OF DISEASE.           Note: An exclamation mark (!) indicates a result that was not dispersed into   the flowsheet.  Document Creation Date: 04/16/2008 1:14 AM  _______________________________________________________________________    (1) Order result status: Final  Collection or observation date-time: 04/15/2008 14:15  Requested date-time:   Receipt date-time: 04/15/2008 14:31  Reported date-time: 04/16/2008 01:00  Referring Physician:    Ordering Physician: [name] [facility])  Specimen Source: S  Source: Arline Asp Order Number: [id number]  Lab site: [facility] [place]      [address]      [place]  [place]  [address]

## 2008-04-15 NOTE — Unmapped (Signed)
Signed by [name] MD on 04/16/2008 at 08:08:45  Patient: [name]  Note: All result statuses are Final unless otherwise noted.    Tests: (1) COMPREHENSIVE METABOLIC PANEL W/EGFR (QDL-10231)    GLUCOSE              [H]  147 mg/dL                   65-78                  FASTING REFERENCE INTERVAL    UREA NITROGEN (BUN)  [H]  26 mg/dL                    4-69    CREATININE                1.3 mg/dL                   0.76-1.46   eGFR NON-AFR. AMERICAN                         [L]  55 mL/min/1.29m2            > OR = 60   eGFR AFRICAN AMERICAN                              >60 mL/min/1.50m2           > OR = 60   BUN/CREATININE RATIO (calc)                              20                          6-22    SODIUM                    140 mmol/L                  135-146    POTASSIUM                 4.3 mmol/L                  3.5-5.3    CHLORIDE                  102 mmol/L                  98-110    CARBON DIOXIDE            29 mmol/L                   21-33    CALCIUM                   9.0 mg/dL                   6.2-95.2    PROTEIN, TOTAL            6.8 g/dL                    8.4-1.3    ALBUMIN                   4.4 g/dL  3.6-5.1    GLOBULIN (calc)           2.4 g/dL                    1.6-1.0   ALBUMIN/GLOBULIN RATIO (calc)                              1.8                         1.0-2.1    BILIRUBIN, TOTAL          0.5 mg/dL                   9.6-0.4    ALKALINE PHOSPHATASE      83 U/L                      40-115    AST                       15 U/L                      10-35    ALT                       18 U/L                      9-60    Note: An exclamation mark (!) indicates a result that was not dispersed into   the flowsheet.  Document Creation Date: 04/15/2008 11:29 PM  _______________________________________________________________________    (1) Order result status: Final  Collection or observation date-time: 04/15/2008 14:15  Requested date-time:   Receipt date-time: 04/15/2008  14:31  Reported date-time: 04/15/2008 23:00  Referring Physician:    Ordering Physician: [name] [facility])  Specimen Source: S  Source: Arline Asp Order Number: [id number]  Lab site: [facility] [place]      [address]      [place]  [place]  [address]

## 2008-04-15 NOTE — Unmapped (Signed)
Signed by [name] MD on 04/15/2008 at 14:45:38      Reason for Visit   Chief Complaint: hypertension, hyperlipidemia    History from: patient    Allergies  No Known Allergies    Medications   PRAVACHOL 40 MG TABS (PRAVASTATIN SODIUM) one by mouth every evening  DIOVAN HCT 320-25 MG TABS (VALSARTAN-HYDROCHLOROTHIAZIDE) one by mouth daily  COREG 12.5 MG TABS (CARVEDILOL) Take one twice a day  LEVOTHYROXINE SODIUM 100 MCG TABS (LEVOTHYROXINE SODIUM) 1 by mouth daily  SAW PALMETTO EXTR (SAW PALMETTO) 1 by mouth daily  GLUCOSAMINE SULFATE POWD (GLUCOSAMINE SULFATE)   VIAGRA 100 MG TABS (SILDENAFIL CITRATE) one-half of a pill daily prn  LIDEX 0.05 % CREA (FLUOCINONIDE) apply twice a day  GLUCOVANCE 2.5-500 MG TABS (GLYBURIDE-METFORMIN) one by mouth qam  OMEPRAZOLE 20 MG CPDR (OMEPRAZOLE) one by mouth daily       Vital Signs:   Wt: 270 lbs.      Wt chg (lbs): -10  Pulse: 70 (regular)  BP: 138/80  Cuff size: large    Pulse Oximetry:   O2 Saturation: 98 % w/ room air   Intake recorded by: Tora Kindred MA on April 15, 2008 1:47 PM        HPI Multiple Chronic   Condition(s): Diabetes, GERD/Reflux, Hyperlipidemia, Hypertension, Hypothyroidism.  Comments: skin:  rash on both legs, itching, x 2 weeks  thought started after using wool sweat pants    Current Status:   Home BP monitoring yes  Fx (x/week): 3  Systolic Range: 121-139  Diastolic Range: <80    Home Glucose Monitoring: yes    Compliance with diet: fair  Compliance with tx: good  Compliance with exercise: fair  Medication Tolerance: good  Compliance Comments:   glucose averaging 170, checks 2 hr pp and in am  was walking discussed treadmill  wife is doing weight watchers    ROS/Symptoms:   Patient denies the following endocrine symptoms: hypoglycemia  Patient complains of the following cardiovascular symptoms: edema -- takes Lasix from dr Sheppard Penton every other day     Patient denies any respiratory symptoms.  Patient denies the following GI symptoms: blood in stool,  pain  Patient complains of the following GI symptoms: wants higher dose of prilosec, protonix was better but stronger dose, getting some reflux  Patient denies any neurologic symptoms.  Patient complains of the following GU symptoms: urinating like it always been  occ incomplete emptying  using saw palmetto -- possible effect on psa -- urination better  Patient denies any musculoskeletal symptoms.  Patient denies any psychological symptoms.  Patient denies any general symptoms.  Patient complains of the following ENT symptoms: sleeps well for four hours on the cpap, then feels it in his sinuses -- NOT USING THE HUMIDIFIER -- RECOMMEND CALL THE COMPANY THAT IS SUPPLYING THIS  Patient complains of the following eye symptoms: last eye exam 6 months ago, ok then  Patient denies any heme/lymph symptoms.          Physical Examination:   BP: 138/  80   Blood Pressure updated by physician.    Physical Exam- Detail:   General Appearance: obese in nad  Skin: dry skin with some rash on legs, consistent with winter skin  Eyes: Sclera white, conjunctiva without injection and pallor.  PERRLA.  EOMI  Ears: No lesions.  Tympanic membranes translucent, non-bulging.  Canal walls pink, without discharge.  Hearing grossly intact. some wax  Nose/Face: Mucosa and  turbinates pink, septum midline. No polyps, no discharge, no lesions.  Oropharynx: edentulous  Oral Cavity: Gums pink, good dentition.  Oral mucosa and tongue without lesions.  Respiratory: Respiration un-labored.  Lung fields clear to auscultation.  No wheezing, rales, rhonchi or pleural rub.  Neck: No thyromegaly.  No nodules, masses or tenderness.  Lymphatic: Areas palpated not enlarged:  cervical, supraclavicular.  Chest: No masses, lesions, or tenderness.  Cardiac: S1 and S2 normal.  RRR without murmurs, rubs, gallops.  No JVD.  Vascular: No carotid bruits.  No edema or varicosities.  Pedal Pulse Left: dorsalis pedis +2, posterior tib +2  Pedal Pulse Right: dorsalis pedis  +2,  posterior tib +2  Monofilament Left: normal  Monofilament Right: normal  Diabetic Foot Check:   Skin integrity intact.  No splitting, ulcers or calluses.  Abdomen: No masses or tenderness. Bowel sounds active x4 quad.  Liver and spleen are without tenderness or enlargement.  No hernias.  Rectal Exam: Normal sphincter tone, no masses.   Hemoccult: Guiac negative  Male G/U: Scrotum without tenderness, swelling or masses.  Testicles without masses.  No penile discharge.  Circumcised: no  Prostate Exam: Prostate enlarged, symmetrical, without nodularity or tenderness.  Neurologic: Cranial nerves 2 through 12 intact.  Deep tendon reflexes 2+ bilaterally.  Sensation intact.  No spasticity.  Strength is 5/5 in upper and lower extremities bilaterally.  Psychiatric: Judgement and insight are within normal limits.  Alert and oriented x3.  No mood disorders noted, appropriate affect.  Musculoskeletal: Gait coordinated and smooth.  Digits are without clubbing or cyanosis.      In office Procedures & Tests   Date/Time Collected: 04-15-2008    Routine Urinalysis     Physical characteristics   Color: yellow  Appearance: clear    Chemical measurements   Glucose (mg/dL): negative  Bilirubin: negative  Ketone (mg/dL): negative  Spec. Gravity: 1.010  Blood: negative  pH: 6.5  Protein (mg/dL): negative  Urobilinogen (mg/dL): 2  Nitrite: negative  Leukocytes: negative    Other tests:     Stool - Occult Blood    #1: Guiac negative         New Problems:  ? of GERD (ICD-530.81)  New Medications:  COREG 25 MG TABS (CARVEDILOL) one by mouth twice daily  OMEPRAZOLE 40 MG CPDR (OMEPRAZOLE) one by mouth daily  LASIX 40 MG TABS (FUROSEMIDE) one by mouth every other day      Preventive Maintenance       Coordinating Care Providers   PCP Name: Kalin Amrhein MD            Prescriptions:  COREG 25 MG TABS (CARVEDILOL) one by mouth twice daily  #180 x 3   Entered and Authorized by: [name] MD   Signed by: [name] MD on 04/15/2008   Method  used: Print then Give to Patient   RxID: [id number]  OMEPRAZOLE 40 MG CPDR (OMEPRAZOLE) one by mouth daily  #90 x 3   Entered and Authorized by: [name] MD   Signed by: [name] MD on 04/15/2008   Method used: Print then Give to Patient   RxID: [id number]  LEVOTHYROXINE SODIUM 100 MCG TABS (LEVOTHYROXINE SODIUM) 1 by mouth daily  #90 x 3   Entered and Authorized by: [name] MD   Signed by: [name] MD on 04/15/2008   Method used: Print then Give to Patient   RxID: [id number]  GLUCOVANCE 2.5-500 MG TABS (GLYBURIDE-METFORMIN) one by mouth qam  #180  x 3   Entered and Authorized by: [name] MD   Signed by: [name] MD on 04/15/2008   Method used: Print then Give to Patient   RxID: [id number]  DIOVAN HCT 320-25 MG TABS (VALSARTAN-HYDROCHLOROTHIAZIDE) one by mouth daily  #90 x 3   Entered and Authorized by: [name] MD   Signed by: [name] MD on 04/15/2008   Method used: Print then Give to Patient   RxID: [id number]  PRAVACHOL 40 MG TABS (PRAVASTATIN SODIUM) one by mouth every evening  #90 x 3   Entered and Authorized by: [name] MD   Signed by: [name] MD on 04/15/2008   Method used: Print then Give to Patient   RxID: [id number]  COREG 12.5 MG TABS (CARVEDILOL) Take one twice a day  #180 x 3   Entered and Authorized by: [name] MD   Signed by: [name] MD on 04/15/2008   Method used: Print then Give to Patient   RxID: [id number]      Assessment and Plan     Problems   Status of Existing Problems:  Assessed DIABETES MELLITUS as deteriorated - check labs - [name] MD - Signed  Assessed HYPOTHYROIDISM NOS as unchanged - check lab - [name] MD - Signed  Assessed HYPERTENSION as deteriorated - change coreg dose discussed - [name] MD - Signed  Assessed ATRIAL FIBRILLATION as improved - [name] MD - Signed  Assessed CHF as improved - [name] MD - Signed  Assessed Question  of  GERD as deteriorated - change prilosec dose - [name] MD - Signed  New Problems:  ? of GERD (ICD-530.81)     Medications   New Prescriptions/Refills:  COREG 25 MG TABS (CARVEDILOL) one by mouth twice daily  #180 x 3, 04/15/2008, [name] MD  OMEPRAZOLE 40 MG CPDR (OMEPRAZOLE) one by mouth daily  #90 x 3, 04/15/2008, [name] MD  LEVOTHYROXINE SODIUM 100 MCG TABS (LEVOTHYROXINE SODIUM) 1 by mouth daily  #90 x 3, 04/15/2008, [name] MD  GLUCOVANCE 2.5-500 MG TABS (GLYBURIDE-METFORMIN) one by mouth qam  #180 x 3, 04/15/2008, [name] MD  DIOVAN HCT 320-25 MG TABS (VALSARTAN-HYDROCHLOROTHIAZIDE) one by mouth daily  #90 x 3, 04/15/2008, [name] MD  PRAVACHOL 40 MG TABS (PRAVASTATIN SODIUM) one by mouth every evening  #90 x 3, 04/15/2008, [name] MD  COREG 12.5 MG TABS (CARVEDILOL) Take one twice a day  #180 x 3, 04/15/2008, [name] MD    Today's Orders   PSA, Screen MCR (PSASCR) (5363) [CPT-G0103]  Lipid Panel w/Reflex to Direct LDL (14852) [CPT-80061]  CMP   (METAPNL) (10231) [CPT-80053]  99214 - Ofc Vst, Est Level IV [VFI-43329]  UA Dipstick (Office) [CPT-81002]  Urine Microalbumin w CR, (UMALB) (6517) [CPT-82043]  Hemoglobin  A1C   (GLYCO) (496)  [CPT-83036]  TSH   (TSH) (899) [CPT-84443]  T4 free  (FT4) (866)  [CPT-84439]    Disposition:   Return to clinic for Doctor Visit in 3-4 month(s)

## 2008-04-15 NOTE — Unmapped (Signed)
Signed by [name] MD on 04/16/2008 at 08:08:45  Patient: [name]  Note: All result statuses are Final unless otherwise noted.    Tests: (1) TSH, 3RD GENERATION (QDL-899)    TSH, 3RD GENERATION       1.50 mIU/L                  0.40-4.50    Note: An exclamation mark (!) indicates a result that was not dispersed into   the flowsheet.  Document Creation Date: 04/16/2008 1:14 AM  _______________________________________________________________________    (1) Order result status: Final  Collection or observation date-time: 04/15/2008 14:15  Requested date-time:   Receipt date-time: 04/15/2008 14:31  Reported date-time: 04/16/2008 01:00  Referring Physician:    Ordering Physician: [name] [facility])  Specimen Source: S  Source: Arline Asp Order Number: [id number]  Lab site: [facility] [place]      [address]      [place]  [place]  [address]

## 2008-04-16 NOTE — Unmapped (Signed)
Signed by Tora Kindred MA on 04/16/2008 at 09:08:32    Prescriptions:  GLUCOVANCE 2.5-500 MG TABS (GLYBURIDE-METFORMIN) one by mouth twice a day  #180 x 3   Entered by: Tora Kindred MA   Authorized by: [name] MD   Signed by: Tora Kindred MA on 04/16/2008   Method used: Print then Give to Patient   RxID: [id number]

## 2008-04-16 NOTE — Unmapped (Signed)
Signed by [name] MD on 04/16/2008 at 29:56:21            [name], M.D.  [address] [place], [place] [address]  Phone [phone]  Fax [phone]      April 16, 2008      [place]  [address] [place], [place] [address]                                                                                           RE:  TEST RESULTS   [name]--1941-02-23)         Dear [name]:      The following is an interpretation of your most recent tests.  Please take note of any instructions provided.  Prostate Specific Antigen (PSA):  normal          PSA: 0.6       Electrolyte studies:  Good         Glucose test abnormal: 147   Kidney function studies:  Good    Liver function studies:  Good    Lipid panel:  Good          Triglyceride: 186   Cholesterol: 163   LDL: 95   HDL: 31   Chol/HDL%:  5.3   Thyroid studies:  Good        TSH: 1.50      Diabetic studies:  Fair          HgbA1c: 7.2           Additional Comments: When treating high cholesterol, our primary goal is to lower the bad cholesterol (LDL) under 100.  With a prior history of heart artery problems or diabetes, we like to get the LDL under 70 if possible.  The triglycerides should ideally be under 150 -- somewhat higher is OK under some circumstances.  The good cholesterol (HDL) should be at least one-fifth of the total cholesterol.    Your cholesterol profile looks good.  Please continue your current cholesterol medication and check another blood test in 4 to 6 months.    Your thyroid blood test is good.  You should continue your current dose of thyroid medication and check another blood test in 6 to 12 months.    The hemoglobin A1c is reported as a percentage -- it indicates what your average blood sugar has been for the last three months.  Well-controlled diabetes is indicated by a hemoglobin A1c under 7 percent, ideally under 6.5 percent.  Yours is 7.2.  I recommend that you change your Glucovance  (glyburide-metformin) pill from 2.5/500 once a day to one twice a day (breakfast and supper).  Watch your blood sugar to make sure it doesn't go too low but does lower to where it should be.  Your fasting blood sugar should be 70-120 each morning, and your two-hours-after-eating blood sugar should be under 180, ideally under 140, and not under 80.  You should recheck a hemoglobin A1c in 3-4 months.  A prescription has been written and should be included with this  letter.       Medications Prescribed or Changed  GLUCOVANCE 2.5-500 MG TABS (GLYBURIDE-METFORMIN) one by mouth twice a day        Sincerely,        [name] MD

## 2008-04-23 NOTE — Unmapped (Signed)
Signed by Tora Kindred MA on 04/23/2008 at 09:49:34    PHONE NOTE - Patient Call    Call back at Home Phone: [phone]  Caller: patient  Call for: [facility]    Reason for Call: refill medication. Pt requesting rf Levoxyl 0.1mg  and Carvedilol 25mg  #30 pt is out of med and is still waiting for his mail order to come.  Call [phone] when phoned to pharmacy    Pharmacy Information: [facility] [phone]      Initial call taken by: Barnetta Hammersmith,  April 23, 2008 9:21 AM      FOLLOW UP  called prescription into pharmacy   patient advised  Follow-up by: Tora Kindred MA,  April 23, 2008 9:49 AM    Prescriptions:  LEVOTHYROXINE SODIUM 100 MCG TABS (LEVOTHYROXINE SODIUM) 1 by mouth daily  #30 x 0   Entered and Authorized by: Pandora Leiter MD   Signed by: Pandora Leiter MD on 04/23/2008   Method used: Telephoned to ...        RxID: [id number]  COREG 25 MG TABS (CARVEDILOL) one by mouth twice daily  #60 x 0   Entered and Authorized by: Pandora Leiter MD   Signed by: Pandora Leiter MD on 04/23/2008   Method used: Telephoned to ...        RxID: [id number]

## 2008-05-12 NOTE — Unmapped (Signed)
Signed by [name] MD on 05/12/2008 at 00:00:00  Cardiac       Imported By: Doyle Askew 07/10/2008 08:25:50    _____________________________________________________________________    External Attachment:    Please see [employer] EMR for this document.

## 2008-05-21 NOTE — Unmapped (Signed)
Signed by Tora Kindred MA on 05/22/2008 at 16:32:43    PHONE NOTE  Call back at Home Phone: [phone]  Caller: patient  Call for: dr. Newman Pies    Reason for Call: pt left some papers to be signed by dr. Newman Pies and also wanted a letter regarding his lap band surgery.        Initial call taken by: Johnell Comings,  May 21, 2008 9:35 AM    Current Allergies:   NKA      FOLLOW UP  called patient to let him know  Follow-up by:  Tora Kindred MA,  May 22, 2008 4:32 PM

## 2008-05-22 NOTE — Unmapped (Signed)
Signed by Rosalyn Gess MD on 05/22/2008 at 00:00:00  Bariatric Packet      Imported By: Coletta Memos 05/22/2009 23:29:37    _____________________________________________________________________    External Attachment:    Please see [employer] EMR for this document.

## 2008-06-01 NOTE — Unmapped (Signed)
Signed by   LinkLogic on 06/03/2008 at 13:42:57  Patient: [name]  Note: All result statuses are Final unless otherwise noted.    Tests: (1)  (MR)    Order Note:                                 [facility]     PATIENT NAME:   WADE, ASEBEDO                MR #:  [id number]  DATE OF BIRTH:  10/20/1940                        ACCOUNT #:  [id number]  ED PHYSICIAN:   Phylliss Blakes, M.D.            ROOM #:  129  PRIMARY:        Mearl Latin, M.D.           NURSING UNIT:  M1PT  REFERRING:                                        FC:  M  DICTATED BY:    Phylliss Blakes, M.D.            ADMIT DATE:  06/01/2008  VISIT DATE:     06/01/2008                        DISCHARGE DATE:  06/02/2008                           EMERGENCY DEPARTMENT ADMISSION NOTE        *** CONTENT REVISION - 06/03/08 - MEL ***     CHIEF COMPLAINT:  Chest pain.     HISTORY OF PRESENT ILLNESS:  This is a 68 year old male with a history of  atrial fibrillation status post maze procedure and mild cardiomyopathy who  has followed cardiologist, Dr. Glade Stanford, who comes in stating over last  several days, he has some intermittent chest discomfort, midsternal chest  area mild to moderate, nonradiating, initially just intermittent and resolved  spontaneously but today had acute onset associated with nausea and  diaphoresis.  That did not resolve, so therefore he called EMS.  It was  moderate to severe in nature the time they arrived.  They gave him an  aspirin.  He started feeling better after they place him on some oxygen and  now denies chest pain on arrival.  He still feels mildly short of breath.  Denies any fever, trauma or orthopnea symptomatology, denies any recent leg  swelling or recent heart procedures.  He does not note any specifics  regarding his coronary vessels and does not know if he has never had a heart  catheterization.     PAST MEDICAL HISTORY:     1.  Congestive heart failure status post maze procedure for  atrial  fibrillation ablation.  2.  History of diabetes, high blood pressure and morbid obesity.  3.  History of sleep apnea.  4.  History of hernia surgery.     SOCIAL HISTORY:  No tobacco, alcohol or drugs.     ALLERGIES:  None.     MEDICATIONS:  Reviewed per nursing reconciliation form.  REVIEW OF SYSTEMS:  All other systems otherwise negative.     PHYSICAL EXAMINATION:     VITAL SIGNS:  Blood pressure 188/123, pulse 70, respirations 18, temperature  98.2 degrees and 100% on nonrebreather on arrival with continuation of  98-100% on room air.  GENERAL:  This is a 68 year old male who is in no obvious distress except for  mild diaphoresis on arrival where a full face mask.  He is alert and talking.  HEENT:  He is normocephalic, atraumatic.  Membranes are moist.  Sclerae are  anicteric.  Pupils equal, round, reactive.  Extraocular movements are intact.  NECK:  No JVD discernable on arrival, but he was not lay flat.  HEART:  Regular rate and rhythm, they could not auscultated gallop.  LUNGS:  Clear bilaterally with no rales.  ABDOMEN:  Soft, no pulsatile masses and nontender.  EXTREMITIES:  Without any clubbing, cyanosis, edema or rashes.  Equal pulses  x4 extremities.  SKIN:  Mildly damped but otherwise without rash.  NEUROLOGIC:  He is alert and oriented x3.     LABORATORY DATA:  Includes an EKG on arrival which demonstrates a normal  sinus rhythm, left ventricular hypertrophy but no acute ischemic changes.     X-RAY:  Chest x-ray demonstrates some mild atelectasis, but no acute  pulmonary edema.     LABORATORY DATA:  His CBC is all within normal limits.  His serum electrolyte  panel was within normal limits.  He does have an elevated glucose of 198.  His cardiac enzymes are negative.  His BNP level was normal at 19.1.     EMERGENCY DEPARTMENT COURSE:  On arrival, the patient had a markedly elevated  blood pressure with recent history of chest pain and known history of  congestive heart failure.  We initiated  emergent therapy with nitroglycerin  per IV drip started at 20 mcg per minute with remarkable improvement over the  course of the next one hour with a drop in his blood pressure to 140/80 over  the course of one to two hours.     MEDICAL DECISION MAKING:  The patient has remained hemodynamically stable and  symptomatically improved; however, given his risk factors and the fact that  despite multiple phone calls to [facility] and attempt to contact his  cardiologist and obtaining medical records.  We were unable to find any  medical delineation of his coronary blood vessels and the patient is unaware  of his status.  Therefore, his chest pain is being considered acute coronary  syndrome, unstable angina until proven otherwise.  He is in no evidence of  extremis or concerns of heart failure at this point in time.  As his blood  pressures improved with nitro, I discussed the case with the on-call  cardiologist Dr. Melvyn Neth, who is covering for his partner, Dr. Octaviano Glow.  Their  group does not come to this hospital, therefore we have contacted Dr. Doree Barthel is on call for [facility] today for UC and he agrees to accept the  patient to the ICU for further monitoring and chest pain, rule out MI.  He  will maintain on nitroglycerin drip.  He is going to the floor.  He is asking  for a cardiac diet.  He is in no distress.  He is being admitted.     DIAGNOSES:     1.  Chest pain, rule out myocardial infarction.  2.  Hypertensive emergency.  3.  History of cardiomyopathy.  4.  Diabetes mellitus type II, not controlled.                                                    ______________________________________  MR/cm                                  ______  D:  06/01/2008 14:25                  Phylliss Blakes, M.D.  T:  06/01/2008 15:22  R:  06/03/2008 13:42 - ths  Job #:  [id number]                                 EMERGENCY DEPARTMENT ADMISSION NOTE                                                               PAGE    1 of    1    Note: An exclamation mark (!) indicates a result that was not dispersed into   the flowsheet.  Document Creation Date: 06/03/2008 1:42 PM  _______________________________________________________________________    (1) Order result status: Corrected  Collection or observation date-time: 06/01/2008 00:00  Requested date-time:   Receipt date-time:   Reported date-time:   Referring Physician: [name]  Ordering Physician:  Reviewed In Hospital [facility])  Specimen Source:   Source: DBS  Filler Order Number: [id number]  Lab site:

## 2008-06-01 NOTE — Unmapped (Signed)
Signed by   LinkLogic on 06/01/2008 at 13:08:35  Patient: [name]  Note: All result statuses are Final unless otherwise noted.    Tests: (1) DIAG-PORTABLE CHEST [phone])    Order Note: [employer] Accession Number: [id number]    Order Note:                            [facility]     Patient: [name], [name]   DOB:     17-Mar-1941   MRN:     [id number]   FIN:       [id number]   Accn#:   [id number]                                  Diagnostic Radiology     Exam                       Exam Date/Time       Ordering Physician   DIAG-PORTABLE CHEST        06/01/2008 12:42 EST [facility] M.D., MARCUS     Reason for Exam   chest pain t1     Report   PORTABLE AP CHEST AT 1237:     REASON FOR EXAM: Chest pain.     COMPARISON: Prior CT of the chest from June 14, 2007.     FINDINGS: Portable AP view of the chest demonstrates increased bibasilar   opacities, favored to be atelectasis. Heart size remains within normal   limits. No large effusion. Normal pulmonary vascularity. Negative for   pneumothorax.     IMPRESSION:     Increased bibasilar opacities, favored to be atelectasis.   ********** VERIFIED REPORT **********     Dictated: 06/01/2008 1:04 pm      [facility] M.D., DAVID E.   Signed Proofreader):  [facility] M.D., DAVID E.          06/01/08   1:08     Technologist: [facility]    Order Note:   EMR Routing to: Mearl Latin - copy to - [id number]    Note: An exclamation mark (!) indicates a result that was not dispersed into   the flowsheet.  Document Creation Date: 06/01/2008 1:08 PM  _______________________________________________________________________    (1) Order result status: Final  Collection or observation date-time: 06/01/2008 12:42:21  Requested date-time: 06/01/2008 12:25:00  Receipt date-time:   Reported date-time: 06/01/2008 13:08:00  Referring Physician: Trudee Grip  Ordering Physician:  Non-EMR Physician [facility])  Specimen Source: RAD&Rad Type  Source: QRS  Filler Order  Number: [id number]  Lab site: [facility]

## 2008-06-01 NOTE — Unmapped (Addendum)
Signed by   LinkLogic on 06/01/2008 at 15:27:34  Patient: [name]  Note: All result statuses are Final unless otherwise noted.    Tests: (1)  (MR)    Order Note:                                 [facility]     PATIENT NAME:   [name], [name]                MR #:  [id number]  DATE OF BIRTH:  1940-11-15                        ACCOUNT #:  [id number]  ED PHYSICIAN:   Phylliss Blakes, M.D.            ROOM #:  129  PRIMARY:        Mearl Latin, M.D.           NURSING UNIT:  M1PT  REFERRING:                                        FC:  M  DICTATED BY:    Phylliss Blakes, M.D.            ADMIT DATE:  06/01/2008  VISIT DATE:     06/01/2008                        DISCHARGE DATE:                           EMERGENCY DEPARTMENT ADMISSION NOTE        CHIEF COMPLAINT:  Chest pain.     HISTORY OF PRESENT ILLNESS:  This is a 68 year old male with a history of  atrial fibrillation status post maze procedure and mild cardiomyopathy who  has followed cardiologist, Dr. Glade Stanford, who comes in stating over last  several days, he has some intermittent chest discomfort, midsternal chest  area mild to moderate, nonradiating, initially just intermittent and resolved  spontaneously but today had acute onset associated with nausea and  diaphoresis.  That did not resolve, so therefore he called EMS.  It was  moderate to severe in nature the time they arrived.  They gave him an  aspirin.  He started feeling better after they place him on some oxygen and  now denies chest pain on arrival.  He still feels mildly short of breath.  Denies any fever, trauma or orthopnea symptomatology, denies any recent leg  swelling or recent heart procedures.  He does not note any specifics  regarding his coronary vessels and does not know if he has never had a heart  catheterization.     PAST MEDICAL HISTORY:     1.  Congestive heart failure status post maze procedure for atrial  fibrillation ablation.  2.  History of diabetes, high  blood pressure and morbid obesity.  3.  History of sleep apnea.  4.  History of hernia surgery.     SOCIAL HISTORY:  No tobacco, alcohol or drugs.     ALLERGIES:  None.     MEDICATIONS:  Reviewed per nursing reconciliation form.     REVIEW OF SYSTEMS:  All other systems otherwise negative.  PHYSICAL EXAMINATION:     VITAL SIGNS:  Blood pressure 188/123, pulse 70, respirations 18, temperature  98.2 degrees and 100% on nonrebreather on arrival with continuation of  98-100% on room air.  GENERAL:  This is a 68 year old male who is in no obvious distress except for  mild diaphoresis on arrival where a full face mask.  He is alert and talking.  HEENT:  He is normocephalic, atraumatic.  Membranes are moist.  Sclerae are  anicteric.  Pupils equal, round, reactive.  Extraocular movements are intact.  NECK:  No JVD discernable on arrival, but he was not lay flat.  HEART:  Regular rate and rhythm, they could not auscultated gallop.  LUNGS:  Clear bilaterally with no rales.  ABDOMEN:  Soft, no pulsatile masses and nontender.  EXTREMITIES:  Without any clubbing, cyanosis, edema or rashes.  Equal pulses  x4 extremities.  SKIN:  Mildly damped but otherwise without rash.  NEUROLOGIC:  He is alert and oriented x3.     LABORATORY DATA:  Includes an EKG on arrival which demonstrates a normal  sinus rhythm, left ventricular hypertrophy but no acute ischemic changes.     X-RAY:  Chest x-ray demonstrates some mild atelectasis, but no acute  pulmonary edema.     LABORATORY DATA:  His CBC is all within normal limits.  His serum electrolyte  panel was within normal limits.  He does have an elevated glucose of 198.  His cardiac enzymes are negative.  His BNP level was normal at 19.1.     EMERGENCY DEPARTMENT COURSE:  On arrival, the patient had a markedly elevated  blood pressure with recent history of chest pain and known history of  congestive heart failure.  We initiated emergent therapy with nitroglycerin  per IV drip started at 20  mcg per minute with remarkable improvement over the  course of the next one hour with a drop in his blood pressure to 140/80 over  the course of one to two hours.     MEDICAL DECISION MAKING:  The patient has remained hemodynamically stable and  symptomatically improved; however, given his risk factors and the fact that  despite multiple phone calls to [facility] and attempt to contact his  cardiologist and obtaining medical records.  We were unable to find any  medical delineation of his coronary blood vessels and the patient is unaware  of his status.  Therefore, his chest pain is being considered acute coronary  syndrome, unstable angina until proven otherwise.  He is in no evidence of  extremis or concerns of heart failure at this point in time.  As his blood  pressures improved with nitro, I discussed the case with Dr. Melvyn Neth, is on  call for his cardiologist, Dr. Glade Stanford.  Their group does not come to this  hospital, therefore we have contacted Dr. Carlis Abbott is on call for  [facility] today for UC and he agrees to accept the patient to the ICU for  further monitoring and chest pain, rule out MI.  He will maintain on  nitroglycerin drip.  He is going to the floor.  He is asking for a cardiac  diet.  He is in no distress.  He is being admitted.     DIAGNOSES:     1.  Chest pain, rule out myocardial infarction.  2.  Hypertensive emergency.  3.  History of cardiomyopathy.  4.  Diabetes mellitus type II, not controlled.  ______________________________________  MR/cm                                  ______  D:  06/01/2008 14:25                  Phylliss Blakes, M.D.  T:  06/01/2008 15:22  Job #:  [id number]                                 EMERGENCY DEPARTMENT ADMISSION NOTE                                                               PAGE    1 of   1    Note: An exclamation mark (!) indicates a result that was not dispersed into   the flowsheet.  Document  Creation Date: 06/01/2008 3:27 PM  _______________________________________________________________________    (1) Order result status: Final  Collection or observation date-time: 06/01/2008 00:00  Requested date-time:   Receipt date-time:   Reported date-time:   Referring Physician: [name]  Ordering Physician:  Reviewed In Hospital [facility])  Specimen Source:   Source: DBS  Filler Order Number: [id number]  Lab site:   Signed by [name] MD on 06/01/2008 at 18:08:06    Coith, not Courtade.  Carlis Abbott MD is a Research officer, trade union in [place], not an intensivist.

## 2008-06-02 NOTE — Unmapped (Signed)
[facility]     PATIENT NAME:   [name], [name]                MR #:  [id number]   DATE OF BIRTH:  06-10-1940                        ACCOUNT #:  [id number]   ADMITTING:      Rusty Aus, M.D.           ROOM #:  129   ATTENDING:      Rusty Aus, M.D.           NURSING UNIT:  M1PT   PRIMARY:        Mearl Latin, M.D.           [facility]   REFERRING:                                        ADMIT DATE:  06/01/2008   DICTATED BY:    Eda Keys, M.D.              DISCHARGE DATE:  06/02/2008                                  HISTORY & PHYSICAL       ADMISSION HISTORY AND PHYSICAL/DISCHARGE SUMMARY     REASON FOR CONSULTATION:  Chest pain.     HISTORY OF PRESENT ILLNESS:  The patient is a 68 year old white male with a   history of atrial fibrillation, status post mini-Maze procedure and mild   cardiomyopathy, diabetes, hypertension and hyperlipidemia who presented to   [facility] with complaints of chest pain.     Reportedly, the patient had been experiencing intermittent chest pain for   approximately one week, located in the low left anterior chest upon first   waking up early in the morning with gradual improvement through the course of   the day.  This was variably related to physical activity.  However, he was   aware of constant soreness, almost like a pulled muscle.  There were no other   accompanying symptoms or any areas of radiation.  On one occasion he had mild   transient left arm soreness.     On 06/01/08 he had same chest pain but was accompanied by nausea, dizziness,   cool, clammy, diaphoresis and overall felt really bad.  At which point he   called the EMS.  His blood pressure was noted to be 188/123 and he was   brought to the emergency room at [facility] for further   evaluation and management.  En route he was given supplemental oxygen along   with oral aspirin and his symptoms had totally resolved by the time  he was in   the emergency room.  He has remained symptom-free since then and has not had   any recurrence of any other discomfort since then.  He did not appear to be   in any significant distress at the time of evaluation.     PAST MEDICAL HISTORY:     1. Diabetes.   2. History of atrial fibrillation status post mini-Maze procedure by Dr.     Florentina Jenny at [facility] in 2000.  3. Hypertension.   4. Hyperlipidemia.   5. Congestive heart failure.   6. Amiodarone induced hyperthyroidism, now off of amiodarone and     anticoagulation.   7. Obstructive sleep apnea on CPAP.   8. Gastroesophageal reflux disease.   9. Hiatal hernia.     PAST SURGICAL HISTORY:     1. Correction of a deviated nasal septum.   2. Uvulopalatopharyngoplasty for obstructive sleep apnea.   3. Right-sided inguinal herniorrhaphy.   4. History of esophageal strictures status post intermittent dilatations.     HOME MEDICATIONS:     1. Glyburide/metformin 2.5/500 mg po bid.   2. Carvedilol 25 mg po bid.   3. Diovan/hydrochlorothiazide 320/25 mg po daily.   4. Levoxyl 100 mcg po daily.   5. Omeprazole 40 mg po daily.   6. Pravastatin 40 mg po daily.   7. Potassium chloride 20 mEq po daily.   8. Saw palmetto 450 mg po bid.   9. Osteo B flex one tablet po twice a day.   10. Aspirin 81 mg po daily.     ALLERGIES:  He is not known to be allergic to any medications.     SOCIAL HISTORY:  He is married, has no children.  He is retired from the   [employer] at [employer].  Denies any tobacco or alcohol use.     FAMILY HISTORY:  Strongly positive for cardiac disease.  Mother died at 46.   Father died at 33 years of age, both had congestive heart failure.  Mother   also had diabetes but precise details of the cardiac problems are unknown.   One brother had a myocardial infarction at 70.  Another brother has   arrhythmias.  Two sisters have had open heart surgery in their 12s.     REVIEW OF SYSTEMS:  As per his presenting symptoms and past medical  history   and all other systems were reviewed and were found to be noncontributory.     EKG:  Showed normal sinus rhythm without any acute ischemic changes.     LABORATORY DATA:  CBC, chem-7, cardiac enzymes and BNP within normal limits.   Random lipid profile showed a total cholesterol 155, HDL 29, LDL 90 and   triglycerides 181.     PHYSICAL EXAMINATION:     MUSCULOSKELETAL:  There was focal tenderness at the takeoff of the left tenth   rib almost reproducing his pain.     ASSESSMENT/PLAN:     1. Chest pain- Which I doubt is angina, suspect musculoskeletal but given his     risk factors, we should proceed with an exercise echocardiogram.   2. Other problems- Continue his preadmission medications.   3. The patient had a regular echocardiogram which revealed normal chamber     size and normal function of left and right ventricles.  Left atrium was     near the upper limits of normal in diameter.  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  Normal valve morphology and function.  He     underwent an exercise echocardiogram during which he reached 73% of his age     predicted target heart rate, but did not have any chest pain, any     reproducible chest pain or EKG changes.  Exercise echocardiogram to     submaximal level of exercise was also totally normal.   4. Given his dilated ascending aorta noted on his transthoracic  echocardiogram we proceeded to obtain a CAT scan of the chest to assess for     any significant aortic aneurysm.   5. The CAT scan of the chest revealed mildly dilated ascending aorta     measuring approximately 4.0 cm without any evidence of dissection, but     there was evidence of bilateral herniation of lung parenchyma between the     fourth and fifth ribs, left side being more than right.     LISTED BELOW ARE HIS MEDICAL PROBLEMS WITH THEIR ACCOMPANYING MEDICATIONS:     1. Chest pain which is very likely nonanginal.  No further workup is     indicated at this point.   2. 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  This is an extremely rare phenomenal and I doubt this     is accounting for his chest pain, however, this will need to be followed up     with a repeat CAT scan in approximately six months.  The findings were     discussed in detail with the patient and his wife, who appeared to     understand. Weight loss is recommended for this procedure and he was     advised on that also.   3. Hyperlipidemia- Continue the statins along with a low fat cholesterol free     diet.   4. Hypertension- Continue his admission medications.  Blood pressure appeared     to be reasonably controlled during his hospital stay.   5. History of paroxysmal atrial fibrillation- The patient has not had any     problems during his hospitalization.   6. Obstructive sleep apnea- He was strongly encouraged to use the CPAP     machine.   7. Obesity- The patient was strongly encouraged to follow through with diet     and exercise and as his primary attempts towards weight reduction.   8. Diabetes- As per Dr. Newman Pies.                                                 ______________________________________   MS/smk                                 ______   D:  06/02/2008 18:27                   Eda Keys, M.D.   T:  06/02/2008 22:50   Job #:  [id number]     c:   Mearl Latin, M.D.          Ky Barban. Sheppard Penton, M.D.                                  HISTORY & PHYSICAL                                                                PAGE    1 of  1     c:   Mearl Latin, M.D.          Ky Barban. Sheppard Penton, M.D.                                  HISTORY & PHYSICAL                                                                PAGE    1 of   1

## 2008-06-02 NOTE — Unmapped (Signed)
Signed by [name] MD on 06/02/2008 at 14:44:39        [facility]   [address]   [place], [place] [address]   [phone]  Fax: [phone]             June 02, 2008      RE:     [name]  DOB:  08-02-1940    MEDICARE GENERIC  [id number]    Dr. Onalee Hua Fischer:    [name] is a 68 year old man with a history of chronic morbid obesity whom I am referring to you for evaluation for surgical treatment of his morbid obesity. His comorbid diseases include hypertension, diabetes, GERD, hypercholesteronemia, Sleep Apnea, and Degenerative Joint Disease.    This patient has tried and failed multiple weight loss attempts including dietary modification, exercise and pharmacologic intervention.  Mr. RESER current weight is 270 pounds which corresponds to a BMI of  40at 72 inches. This does categorize his morbidly obese according to the NIH consensus criteria.    Please consider him for bariatric surgery.  This represents his final best chance to achieve significant weight loss and reduce serious risks to his life and wellbeing.    Thank you and I look forward to your evaluation.    Sincerely,          [name], MD

## 2008-06-02 NOTE — Unmapped (Signed)
Signed by   LinkLogic on 06/02/2008 at 14:57:13  Patient: [name]  Note: All result statuses are Final unless otherwise noted.    Tests: (1)  ([place])    Order Note: [facility]  Department of Cardiology  [address]  [place], [place] [address]  Stress Echocardiography    Patient:    [name], [name]  MR #:       [id number]  Account:    [id number]  Study Date: 06/02/2008  Gender:     M  Age:        68  DOB:        09/11/1940  Room:       129  Referring Physician:    [name]  Interpreting Physician: Eda Keys MD      SONOGRAPHER  Marin Olp RDCS   ORDERING     Eda Keys MD   PERFORMING   Eda Keys MD   REFERRING    Lorin Glass, [place]   ATTENDING    Leesar, [place]   Orland Jarred    -------------------------------------------------------------------    Procedure:ECHO-STRESS    Order:ECHO-STRESS    Accession  ECHO - EXERCISE          ECHO - EXERCISE      WUXLKG:[id number]    -------------------------------------------------------------------  Indications: Chest pain 786.51.    -------------------------------------------------------------------  PMH: MAZE procedure 12/2007. Chest pain. Dyspnea. Congestive heart  failure. Risk factors: Family history of coronary artery disease.  Hypertension. Diabetes mellitus. Dyslipidemia.    -------------------------------------------------------------------  Study data:  Study status: Elective. Consent: The risks, benefits,  and alternatives to the procedure were explained to the patient and  informed consent was obtained. Procedure: Initial setup. A baseline  ECG was recorded. Intravenous access was obtained. Surface ECG  leads and manual cuff blood pressure measurements were monitored.  Treadmill exercise testing was performed using the Bruce protocol.  The patient exercised for 7 min 37 sec, to protocol stage 3, to a  maximal work rate of 9.54mets. Exercise was terminated due to  dyspnea and  fatigue. The patient was positioned for image  acquisition and recovery monitoring. Transthoracic stress  echocardiography. Image quality was adequate. Images were captured  at baseline and peak exercise. Study completion: All IVs inserted  during the procedure were removed. The patient tolerated the  procedure well and was discharged from the lab. There were no  complications.    Bruce protocol. Stress echocardiography. Patient  status: Inpatient.    -------------------------------------------------------------------  Study Conclusions    - Stress ECG conclusions: There were no stress arrhythmias or    conduction abnormalities. The stress ECG was negative for    ischemia to submaximal level of exercise - inconclusive.  - Stress echo: There was no echocardiographic evidence for    stress-induced ischemia.  Impressions: Nondiagnostic for ischemia, due to failure to reach  target heart rate. Patient was asymptomatic, without any associated  ECG or echocardiographic changes, to achieved level of exercise  (73% of target heartrate).    -------------------------------------------------------------------  Cardiac Anatomy  Baseline ECG: Normal.  Stress protocol:    +--------------------+---+---------+---------------------+--------+   Stage                HR  BP (mmHg) Rhythm                Symptoms   +--------------------+---+---------+---------------------+--------+   Baseline; treadmill  71  144/86    ---------------------  None       +--------------------+---+---------+---------------------+--------+   Stage 1; 3 min       88  142/90    --------------------- --------   +--------------------+---+---------+---------------------+--------+   Stage 2; 3 min       97  146/90    Rare PAC's, rare      Dyspnea                                        PVC's                            +--------------------+---+---------+---------------------+--------+   Stage 3; 1 min 37    111 --------- --------------------- Dyspnea     sec                                                                  +--------------------+---+---------+---------------------+--------+   Recovery; 1 min      90  210/86    Rare PAC's            --------   +--------------------+---+---------+---------------------+--------+   Recovery; 3 min      87  --------- --------------------- --------   +--------------------+---+---------+---------------------+--------+   Recovery; 4 min      83  180/86    --------------------- --------   +--------------------+---+---------+---------------------+--------+  Stress results: Maximal heart rate during stress was 111bpm (73% of  maximal predicted heart rate). The maximal predicted heart rate was  130bpm. The target heart rate was achieved. The heart rate response  to stress was normal. There was a normal resting blood pressure  with an appropriate response to stress. The patient experienced no  chest pain during stress.  Stress ECG: There were no stress arrhythmias or conduction  abnormalities. Sinus tachycardia. Rare isolated ectopy, atrial and  ventricular. The stress ECG was negative for ischemia to submaximal  level of exercise - inconclusive.  Baseline: LV size was normal. LV global systolic function was  normal. The estimated LV ejection fraction was 60%. Normal wall  motion; no LV regional wall motion abnormalities.  Peak stress: LV size was reduced appropriately. LV global systolic  function was normal. The estimated LV ejection fraction was 65%.  Normal wall motion; no LV regional wall motion abnormalities.  Stress echo results: Left ventricular ejection fraction was normal  at rest and with stress. There was no echocardiographic evidence  for stress-induced ischemia.    Reviewed and confirmed by    Eda Keys MD  2010-02-22T14:[id number]    Note: An exclamation mark (!) indicates a result that was not dispersed into   the flowsheet.  Document Creation Date: 06/02/2008 2:57  PM  _______________________________________________________________________    (1) Order result status: Final  Collection or observation date-time: 06/02/2008 14:07:04  Requested date-time: 06/02/2008 13:33:10  Receipt date-time:   Reported date-time: 06/02/2008 14:56:55  Referring Physician: [name]  Ordering Physician:  Reviewed In Hospital [facility])  Specimen Source:   Source: DBS  Filler Order Number: [id number]  Lab site:

## 2008-06-02 NOTE — Unmapped (Signed)
Signed by [name] MD on 06/02/2008 at 14:45:09        [facility]   [address]   [place], [place] [address]   [phone]  Fax: [phone]             June 02, 2008      RE:     [name]  DOB:  [birth year]    MEDICARE GENERIC  [id number]    To Whom it May Concern:    I had the pleasure of visiting with FAUST THORINGTON, he is 68 Years Old with a history of chronic morbid obesity, in preoperative evaluation for surgical treatment of his morbid obesity. His comorbid diseases include:    ___ Hypertension     ___ Diabetes  ___ GERD      ___ Coronary Artery Disease  ___ Hypercholesteronemia    ___ Stress Incontinence  ___ Sleep Apnea     ___ Infertility  ___ Degenerative Joint Disease    ___ Failed weight loss surgery  ___ Back, Knee, Ankle Pain    ___ Kidney Stones    This patient has tried and failed multiple weight loss attempts including dietary modification, exercise and pharmacologic intervention.  Mr. BAMBER current weight is 270 pounds which corresponds to a BMI of  at  inches. This does categorize his morbidly obese according to the NIH consensus criteria.    In preparation for weight reduction surgery, he will also undergo psychiatric clearance, sleep evaluation, nutritional and support group counseling.    The patient is an excellent candidate for weight reduction surgery and I ask that you seriously consider this request for the following procedure:    ___ Lap-Band      ___ Weight Loss Revision Surgery  ___ Roux-en-Y Gastric Bypass    ___ Weight Loss Reversal Surgery  ___ Duodenal Switch     ___ Other: _______________________      Thank you and I look forward to your positive response.    Sincerely,

## 2008-06-02 NOTE — Unmapped (Signed)
Signed by   LinkLogic on 06/02/2008 at 22:58:50  Patient: [name]  Note: All result statuses are Final unless otherwise noted.    Tests: (1)  (MR)    Order Note:                                 [facility]     PATIENT NAME:   [name], [name]                MR #:  [id number]  DATE OF BIRTH:  [birth year]                        ACCOUNT #:  [id number]  ADMITTING:      Rusty Aus, M.D.           ROOM #:  129  ATTENDING:      Rusty Aus, M.D.           NURSING UNIT:  M1PT  PRIMARY:        Mearl Latin, M.D.           [facility]  REFERRING:                                        ADMIT DATE:  06/01/2008  DICTATED BY:    Eda Keys, M.D.              DISCHARGE DATE:  06/02/2008                                  HISTORY & PHYSICAL        ADMISSION HISTORY AND PHYSICAL/DISCHARGE SUMMARY     REASON FOR CONSULTATION:  Chest pain.     HISTORY OF PRESENT ILLNESS:  The patient is a 68 year old white male with a  history of atrial fibrillation, status post mini-Maze procedure and mild  cardiomyopathy, diabetes, hypertension and hyperlipidemia who presented to  [facility] with complaints of chest pain.     Reportedly, the patient had been experiencing intermittent chest pain for  approximately one week, located in the low left anterior chest upon first  waking up early in the morning with gradual improvement through the course of  the day.  This was variably related to physical activity.  However, he was  aware of constant soreness, almost like a pulled muscle.  There were no other  accompanying symptoms or any areas of radiation.  On one occasion he had mild  transient left arm soreness.     On 06/01/08 he had same chest pain but was accompanied by nausea, dizziness,  cool, clammy, diaphoresis and overall felt really bad.  At which point he  called the EMS.  His blood pressure was noted to be 188/123 and he was  brought to the emergency room at [facility]  for further  evaluation and management.  En route he was given supplemental oxygen along  with oral aspirin and his symptoms had totally resolved by the time he was in  the emergency room.  He has remained symptom-free since then and has not had  any recurrence of any other discomfort since then.  He did not appear to be  in  any significant distress at the time of evaluation.     PAST MEDICAL HISTORY:     1. Diabetes.  2. History of atrial fibrillation status post mini-Maze procedure by Dr.    Florentina Jenny at [facility] in 2000.  3. Hypertension.  4. Hyperlipidemia.  5. Congestive heart failure.  6. Amiodarone induced hyperthyroidism, now off of amiodarone and    anticoagulation.  7. Obstructive sleep apnea on CPAP.  8. Gastroesophageal reflux disease.  9. Hiatal hernia.     PAST SURGICAL HISTORY:     1. Correction of a deviated nasal septum.  2. Uvulopalatopharyngoplasty for obstructive sleep apnea.  3. Right-sided inguinal herniorrhaphy.  4. History of esophageal strictures status post intermittent dilatations.     HOME MEDICATIONS:     1. Glyburide/metformin 2.5/500 mg po bid.  2. Carvedilol 25 mg po bid.  3. Diovan/hydrochlorothiazide 320/25 mg po daily.  4. Levoxyl 100 mcg po daily.  5. Omeprazole 40 mg po daily.  6. Pravastatin 40 mg po daily.  7. Potassium chloride 20 mEq po daily.  8. Saw palmetto 450 mg po bid.  9. Osteo B flex one tablet po twice a day.  10. Aspirin 81 mg po daily.     ALLERGIES:  He is not known to be allergic to any medications.     SOCIAL HISTORY:  He is married, has no children.  He is retired from the  [employer] at [employer].  Denies any tobacco or alcohol use.     FAMILY HISTORY:  Strongly positive for cardiac disease.  Mother died at 73.  Father died at 76 years of age, both had congestive heart failure.  Mother  also had diabetes but precise details of the cardiac problems are unknown.  One brother had a myocardial infarction at 20.  Another brother has  arrhythmias.  Two  sisters have had open heart surgery in their 41s.     REVIEW OF SYSTEMS:  As per his presenting symptoms and past medical history  and all other systems were reviewed and were found to be noncontributory.     EKG:  Showed normal sinus rhythm without any acute ischemic changes.     LABORATORY DATA:  CBC, chem-7, cardiac enzymes and BNP within normal limits.  Random lipid profile showed a total cholesterol 155, HDL 29, LDL 90 and  triglycerides 130.     PHYSICAL EXAMINATION:     MUSCULOSKELETAL:  There was focal tenderness at the takeoff of the left tenth  rib almost reproducing his pain.     ASSESSMENT/PLAN:     1. Chest pain- Which I doubt is angina, suspect musculoskeletal but given his    risk factors, we should proceed with an exercise echocardiogram.  2. Other problems- Continue his preadmission medications.  3. The patient had a regular echocardiogram which revealed normal chamber    size and normal function of left and right ventricles.  Left atrium was    near the upper limits of normal in diameter.  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  Normal valve morphology and function.  He    underwent an exercise echocardiogram during which he reached 73% of his age    predicted target heart rate, but did not have any chest pain, any    reproducible chest pain or EKG changes.  Exercise echocardiogram to    submaximal level of exercise was also totally normal.  4. Given his dilated ascending aorta noted on  his transthoracic    echocardiogram we proceeded to obtain a CAT scan of the chest to assess for    any significant aortic aneurysm.  5. The CAT scan of the chest revealed mildly dilated ascending aorta    measuring approximately 4.0 cm without any evidence of dissection, but    there was evidence of bilateral herniation of lung parenchyma between the    fourth and fifth ribs, left side being more than right.     LISTED BELOW ARE HIS MEDICAL PROBLEMS WITH THEIR ACCOMPANYING MEDICATIONS:     1. Chest  pain which is very likely nonanginal.  No further workup is    indicated at this point.  2. Bilateral lung herniation- Herniation of lung parenchyma noted on his CAT    scan of the chest.  This is an extremely rare phenomenal and I doubt this    is accounting for his chest pain, however, this will need to be followed up    with a repeat CAT scan in approximately six months.  The findings were    discussed in detail with the patient and his wife, who appeared to    understand. Weight loss is recommended for this procedure and he was    advised on that also.  3. Hyperlipidemia- Continue the statins along with a low fat cholesterol free    diet.  4. Hypertension- Continue his admission medications.  Blood pressure appeared    to be reasonably controlled during his hospital stay.  5. History of paroxysmal atrial fibrillation- The patient has not had any    problems during his hospitalization.  6. Obstructive sleep apnea- He was strongly encouraged to use the CPAP    machine.  7. Obesity- The patient was strongly encouraged to follow through with diet    and exercise and as his primary attempts towards weight reduction.  8. Diabetes- As per Dr. Newman Pies.                                                    ______________________________________  MS/smk                                 ______  D:  06/02/2008 18:27                   Eda Keys, M.D.  T:  06/02/2008 22:50  Job #:  [id number]     c:   Mearl Latin, M.D.          Ky Barban. Sheppard Penton, M.D.                                  HISTORY & PHYSICAL                                                               PAGE    1 of   1    Note: An exclamation mark (!) indicates a result that was not dispersed into   the  flowsheet.  Document Creation Date: 06/02/2008 10:58 PM  _______________________________________________________________________    (1) Order result status: Final  Collection or observation date-time: 06/02/2008 00:00  Requested date-time:   Receipt date-time:    Reported date-time:   Referring Physician: [name]  Ordering Physician:  Reviewed In Hospital [facility])  Specimen Source:   Source: DBS  Filler Order Number: [id number]  Lab site:

## 2008-06-02 NOTE — Unmapped (Signed)
Signed by   LinkLogic on 06/02/2008 at 14:35:02  Patient: [name]  Note: All result statuses are Final unless otherwise noted.    Tests: (1)  ([place])    Order Note: [facility]  Department of Cardiology  [address]  [place], [place] [address]  Transthoracic Echocardiography    Patient:    [name], [name]  MR #:       [id number]  Account:    [id number]  Study Date: 06/02/2008  Gender:     M  Age:        68  DOB:        09-May-1940  Room:       129  Referring Physician:    [name]  Interpreting Physician: Eda Keys MD      SONOGRAPHER  Marin Olp RDCS   PERFORMING   [facility], Festus Aloe   REFERRING    Lorin Glass, [place]   ATTENDING    Leesar, [place]   ORDERING     Leesar, [place]   CONSULTING   [place], Brynda Greathouse    -------------------------------------------------------------------    Procedure:ECHO-2-D ECHO  Order:ECHO-2-D ECHO  Accession  COMPLETE (TTE)           COMPLETE (TTE)       Number:[id number]    -------------------------------------------------------------------  Indications: Chest pain 786.51.    -------------------------------------------------------------------  PMH: MAZE procedure 12/2007. Chest pain. Dyspnea. Atrial  fibrillation. Cardiomyopathy of unknown etiology. Congestive heart  failure. Risk factors: Hypertension. Diabetes mellitus.    -------------------------------------------------------------------  Study data:  Study status: Elective. Procedure: Transthoracic  echocardiography. Image quality was fair. Scanning was performed  from the parasternal, apical, and subcostal acoustic windows. Study  completion: The patient tolerated the procedure well and was  discharged from the lab.    Transthoracic echocardiography. M-mode,  complete 2D, complete spectral Doppler, and color Doppler. Patient  status: Inpatient. Location: Echo laboratory.    -------------------------------------------------------------------  Study  Conclusions    - Left ventricle: The cavity size was normal. Wall thickness was    normal. Systolic function was normal. The estimated ejection    fraction was in the range of 60% to 65%. Wall motion was normal;    there were no regional wall motion abnormalities. Left    ventricular diastolic function parameters were normal.  - Mitral valve: Mild regurgitation.  - Left atrium: The atrium was mildly dilated.  - Pulmonic valve: Peak gradient: 3mm Hg (S).    -------------------------------------------------------------------  Cardiac Anatomy  Left ventricle:    - The cavity size was normal. Wall thickness was normal. Systolic    function was normal. The estimated ejection fraction was in the    range of 60% to 65%. Wall motion was normal; there were no    regional wall motion abnormalities.  - The transmitral flow pattern was normal. The deceleration time of    the early transmitral flow velocity was normal. The pulmonary    vein flow pattern was normal. The tissue Doppler parameters were    normal. Left ventricular diastolic function parameters were    normal.  Aortic valve:    - Structurally normal valve. Trileaflet. Cusp separation was    normal.  Doppler:    - Transvalvular velocity was within the normal range. There was no    stenosis. No regurgitation.  - Peak gradient: 6mm Hg (S).  Aorta:    - The aorta was mildly dilated.  Mitral  valve:    - Structurally normal valve. Leaflet separation was normal.  Doppler:    - Transvalvular velocity was within the normal range. There was no    evidence for stenosis. Mild regurgitation.  Left atrium:    - The atrium was mildly dilated.  Right ventricle:    - The cavity size was normal. Systolic function was normal.    Systolic pressure was within the normal range.  Pulmonic valve:    - Not visualized.  Doppler:    - Trivial regurgitation.  - Peak gradient: 3mm Hg (S).  Tricuspid valve:    - Structurally normal valve. Leaflet separation was normal.  Doppler:    -  Transvalvular velocity was within the normal range. Trivial    regurgitation.  Pulmonary artery:    - Systolic pressure was within the normal range.  Right atrium:    - The atrium was normal in size.  Pericardium:    - There was no pericardial effusion.    -------------------------------------------------------------------    2D measurements                                            Normal  Left ventricle  LV internal dimension, ED, chordal level, PLAX *61.3 mm    43-52  LV internal dimension, ES, chordal level, PLAX *46.5 mm    23-38  Fractional shortening, chordal level, PLAX       *24 %     >29  LV posterior wall thickness, ED                 9.24 mm    -------  Ejection fraction                               56.1 %     -------  Ventricular septum  Septal thickness, ED                            10.9 mm    -------  Aorta  Root diameter, ED                                 41 mm    -------  Left atrium  Anterior-posterior dimension                      39 mm    -------  Superior-inferior dimension, A4C                  39 mm    29-53  Right ventricle  RV internal dimension, ED, PLAX                 33.5 mm    19-38     Doppler measurements                                       Normal  Aortic valve  Peak velocity, S                                 119  cm/s  -------  Peak gradient, S                                   6 mm Hg -------  Mitral valve  Peak E-wave velocity                             105 cm/s  -------  Peak A-wave velocity                            66.2 cm/s  -------  Deceleration time                               *261 ms    150-230  Peak E/A ratio                                   1.6       -------  Tricuspid valve  Regurgitant peak velocity                        240 cm/s  -------  Maximal regurgitant velocity                     240 cm/s  -------  Pulmonic valve  Peak velocity, S                                85.9 cm/s  -------  Peak gradient, S                                   3 mm Hg  -------  Legend:  Mean values are shown as u=mean value.  Asterisk (*) marks values outside specified normal range.  Reviewed and confirmed by    Eda Keys MD  2010-02-22T14:34:38.517    Note: An exclamation mark (!) indicates a result that was not dispersed into   the flowsheet.  Document Creation Date: 06/02/2008 2:35 PM  _______________________________________________________________________    (1) Order result status: Final  Collection or observation date-time: 06/02/2008 09:04:01  Requested date-time: 06/02/2008 08:00:10  Receipt date-time:   Reported date-time: 06/02/2008 14:34:49  Referring Physician: [name]  Ordering Physician:  Reviewed In Hospital [facility])  Specimen Source:   Source: DBS  Filler Order Number: [id number]  Lab site:

## 2008-06-03 NOTE — Unmapped (Signed)
Signed by [name] MD on 06/03/2008 at 00:00:00  Surgery      Imported By: Deon Pilling 07/23/2008 16:00:36    _____________________________________________________________________    External Attachment:    Please see [employer] EMR for this document.

## 2008-06-03 NOTE — Unmapped (Signed)
[facility]     PATIENT NAME:   [name], [name]                MR #:  [id number]   DATE OF BIRTH:  1940/11/04                        ACCOUNT #:  [id number]   ED PHYSICIAN:   Phylliss Blakes, M.D.            ROOM #:  129   PRIMARY:        Mearl Latin, M.D.           NURSING UNIT:  M1PT   REFERRING:                                        FC:  M   DICTATED BY:    Phylliss Blakes, M.D.            ADMIT DATE:  06/01/2008   VISIT DATE:     06/01/2008                        DISCHARGE DATE:  06/02/2008                           EMERGENCY DEPARTMENT ADMISSION NOTE       *** CONTENT REVISION - 06/03/08 - MEL ***     CHIEF COMPLAINT:  Chest pain.     HISTORY OF PRESENT ILLNESS:  This is a 68 year old male with a history of   atrial fibrillation status post maze procedure and mild cardiomyopathy who   has followed cardiologist, Dr. Glade Stanford, who comes in stating over last   several days, he has some intermittent chest discomfort, midsternal chest   area mild to moderate, nonradiating, initially just intermittent and resolved   spontaneously but today had acute onset associated with nausea and   diaphoresis.  That did not resolve, so therefore he called EMS.  It was   moderate to severe in nature the time they arrived.  They gave him an   aspirin.  He started feeling better after they place him on some oxygen and   now denies chest pain on arrival.  He still feels mildly short of breath.   Denies any fever, trauma or orthopnea symptomatology, denies any recent leg   swelling or recent heart procedures.  He does not note any specifics   regarding his coronary vessels and does not know if he has never had a heart   catheterization.     PAST MEDICAL HISTORY:     1.  Congestive heart failure status post maze procedure for atrial   fibrillation ablation.   2.  History of diabetes, high blood pressure and morbid obesity.   3.  History of sleep apnea.   4.  History of hernia  surgery.     SOCIAL HISTORY:  No tobacco, alcohol or drugs.     ALLERGIES:  None.     MEDICATIONS:  Reviewed per nursing reconciliation form.     REVIEW OF SYSTEMS:  All other systems otherwise negative.     PHYSICAL EXAMINATION:     VITAL SIGNS:  Blood pressure 188/123, pulse 70, respirations 18, temperature   98.2 degrees and 100% on nonrebreather on arrival with  continuation of   98-100% on room air.   GENERAL:  This is a 68 year old male who is in no obvious distress except for   mild diaphoresis on arrival where a full face mask.  He is alert and talking.   HEENT:  He is normocephalic, atraumatic.  Membranes are moist.  Sclerae are   anicteric.  Pupils equal, round, reactive.  Extraocular movements are intact.   NECK:  No JVD discernable on arrival, but he was not lay flat.   HEART:  Regular rate and rhythm, they could not auscultated gallop.   LUNGS:  Clear bilaterally with no rales.   ABDOMEN:  Soft, no pulsatile masses and nontender.   EXTREMITIES:  Without any clubbing, cyanosis, edema or rashes.  Equal pulses   x4 extremities.   SKIN:  Mildly damped but otherwise without rash.   NEUROLOGIC:  He is alert and oriented x3.     LABORATORY DATA:  Includes an EKG on arrival which demonstrates a normal   sinus rhythm, left ventricular hypertrophy but no acute ischemic changes.     X-RAY:  Chest x-ray demonstrates some mild atelectasis, but no acute   pulmonary edema.     LABORATORY DATA:  His CBC is all within normal limits.  His serum electrolyte   panel was within normal limits.  He does have an elevated glucose of 198.   His cardiac enzymes are negative.  His BNP level was normal at 19.1.     EMERGENCY DEPARTMENT COURSE:  On arrival, the patient had a markedly elevated   blood pressure with recent history of chest pain and known history of   congestive heart failure.  We initiated emergent therapy with nitroglycerin   per IV drip started at 20 mcg per minute with remarkable improvement over the   course of the  next one hour with a drop in his blood pressure to 140/80 over   the course of one to two hours.     MEDICAL DECISION MAKING:  The patient has remained hemodynamically stable and   symptomatically improved; however, given his risk factors and the fact that   despite multiple phone calls to [facility] and attempt to contact his   cardiologist and obtaining medical records.  We were unable to find any   medical delineation of his coronary blood vessels and the patient is unaware   of his status.  Therefore, his chest pain is being considered acute coronary   syndrome, unstable angina until proven otherwise.  He is in no evidence of   extremis or concerns of heart failure at this point in time.  As his blood   pressures improved with nitro, I discussed the case with the on-call   cardiologist Dr. Melvyn Neth, who is covering for his partner, Dr. Octaviano Glow.  Their   group does not come to this hospital, therefore we have contacted Dr. Doree Barthel is on call for [facility] today for UC and he agrees to accept the   patient to the ICU for further monitoring and chest pain, rule out MI.  He   will maintain on nitroglycerin drip.  He is going to the floor.  He is asking   for a cardiac diet.  He is in no distress.  He is being admitted.     DIAGNOSES:     1.  Chest pain, rule out myocardial infarction.   2.  Hypertensive emergency.   3.  History of cardiomyopathy.   4.  Diabetes mellitus type II, not controlled.                                                 ______________________________________   MR/cm                                  ______   D:  06/01/2008 14:25                  Phylliss Blakes, M.D.   T:  06/01/2008 15:22   R:  06/03/2008 13:42 - ths   Job #:  [id number]                               EMERGENCY DEPARTMENT ADMISSION NOTE                                                                PAGE    1 of   1   T:  06/01/2008 15:22   R:  06/03/2008 13:42 - ths   Job #:  [id number]                               EMERGENCY  DEPARTMENT ADMISSION NOTE                                                                PAGE    1 of   1

## 2008-06-03 NOTE — Unmapped (Signed)
Signed by Eda Keys MD on 06/03/2008 at 00:00:00  Letter      Imported By: Betsey Amen 06/24/2008 11:21:06    _____________________________________________________________________    External Attachment:    Please see [employer] EMR for this document.

## 2008-06-04 NOTE — Unmapped (Signed)
Signed by Marlowe Alt on 06/04/2008 at 09:10:15    Clinical Lists Changes      I left a vm asking him to call to sched appt w Dr Elson Clan

## 2008-06-16 NOTE — Unmapped (Signed)
Signed by Kerrie Pleasure MA on 06/16/2008 at 11:11:29    [facility] - PRELOAD      Preload Clinical Lists   Problems:   ? 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.8)  HEMORRHOIDS (ICD-455.6)  INSECT BITE (ICD-919.4)  HYPOTHYROIDISM NOS (ICD-244.9)  DIABETES MELLITUS (ICD-250.00)  SCREENING FOR MALIGNANT NEOPLASM, PROSTATE (ICD-V76.44)  HYPERTENSION (ICD-401.9)  HYPERLIPIDEMIA (ICD-272.4)  ATRIAL FIBRILLATION (ICD-427.31)  CHF (ICD-428.0)    Medications:   PRAVACHOL 40 MG TABS (PRAVASTATIN SODIUM) one by mouth every evening  DIOVAN HCT 320-25 MG TABS (VALSARTAN-HYDROCHLOROTHIAZIDE) one by mouth daily  COREG 25 MG TABS (CARVEDILOL) one by mouth twice daily  LEVOTHYROXINE SODIUM 100 MCG TABS (LEVOTHYROXINE SODIUM) 1 by mouth daily  SAW PALMETTO EXTR (SAW PALMETTO) 1 by mouth daily  GLUCOSAMINE SULFATE POWD (GLUCOSAMINE SULFATE)   VIAGRA 100 MG TABS (SILDENAFIL CITRATE) one-half of a pill daily prn  LIDEX 0.05 % CREA (FLUOCINONIDE) apply twice a day  GLUCOVANCE 2.5-500 MG TABS (GLYBURIDE-METFORMIN) one by mouth twice a day  OMEPRAZOLE 40 MG CPDR (OMEPRAZOLE) one by mouth daily  LASIX 40 MG TABS (FUROSEMIDE) one by mouth every other day      Allergies:  No Known Allergies  Past History  Past Medical History (reviewed - no changes required):  Atrial Fibrillation, Hyperlipidemia, Hypertension, Dilated Cardiomyopathy, Diabetes Type II, Hypothyroidism, sleep apnea  Surgical History (reviewed - no changes required):  Septoplasty: ., UPPP, Hernia Surgery: right inguinal, No problems with anesthesia    Family History (reviewed - no changes required): Mother - dm2, htn  Sister - dm2, cad  Brother - dm2, cad, htn  Social History (reviewed - no changes required): Marital Status: married,   Employment Status: retired,   Patient Lives at: home,   Support System: excellent  Exercise: not due to foot,   Caffeine per Day: 0  Seatbelt Use: 100 %  of the time  Drug Use: none  Tobacco Usage:non-smoker    Previous Echocardiogram:  2.22.10 echo  Study Conclusions     - Left ventricle: The cavity size was normal. Wall thickness was     normal. Systolic function was normal. The estimated ejection     fraction was in the range of 60% to 65%. Wall motion was normal;     there were no regional wall motion abnormalities. Left     ventricular diastolic function parameters were normal.   - Mitral valve: Mild regurgitation.   - Left atrium: The atrium was mildly dilated.   - Pulmonic valve: Peak gradient: 3mm Hg (S).    Previous Nuclear Study:  2.22.10 stress  Study Conclusions     - Stress ECG conclusions: There were no stress arrhythmias or     conduction abnormalities. The stress ECG was negative for     ischemia to submaximal level of exercise - inconclusive.   - Stress echo: There was no echocardiographic evidence for     stress-induced ischemia.   Impressions: Nondiagnostic for ischemia, due to failure to reach   target heart rate. Patient was asymptomatic, without any associated   ECG or echocardiographic changes, to achieved level of exercise   (73% of target heartrate).   Normal stress test to submaximal  level of exercise - inconclusive.   STRESS ECHO REPORT IN VERICIS AND EKG STRIPS IN MUSE   Confirmed by SUPERVISING, CARDIOLOGIST ([id number]), editor WESTERMEYER, MARILYN ([id number]) on 06/02/2008 2:45:21 PM  Previous EKG/Holter/Event Monitor:  2.22.10 ekg  Ventricular Rate  63 BPM   Atrial Rate  63 BPM   P-R Interval  208 ms   QRS Duration  106 ms   QT  468 ms   QTc  478 ms   P Axis  -21 degrees   R Axis  -7 degrees   T Axis  68 degrees     NORMAL SINUS RHYTHM   MINIMAL VOLTAGE CRITERIA FOR LVH, MAY BE NORMAL VARIANT   NONSPECIFIC T WAVE CHANGE   NONSPECIFIC INTRAVENTRICULAR CONDUCTION DELAY   LEFT AXIS DEVIATION, LEFT ANTERIOR HEMIBLOCK   ABNORMAL ECG    Previous [address] Cardiac CT/MRI:  cardiac ct 9.24.09  MPRESSION:    Agatston calcium score of 87. No evidence of  pulmonary vein stenosis.Standard pulmonary venous anatomy.    Tiny orificial branches in the right inferior pulmonary vein.    No evidence of a left atrial appendage thrombus. The left atrial appendage travels superior and anterior to rest lateral to the pulmonary outflow tract. Please note that the first obtuse marginal branch is immediately anterior to the base of the left atrial appendage.      Standard coronary anatomy with right coronary dominance.    Minimal calcified and noncalcified plaque in coronary circulation with no evidence of significant stenosis. Please note that there is calcified plaque at the origin of D1. It is difficult to assess the marked narrowing caused that this plaque as the D1 branch is small. Mild narrowing at the origin of D2 caused by calcified plaque.    Groundglass opacity along the right major fissure. Consider dedicated chest CT in 12 months to ensure stability.    Hepatic cyst.    Dilation and mild wall thickening of the esophagus throughout its course. Although this could be related to nondistention consider further evaluation with upper GI or endoscopy. **** end of result ****    Previous Radiology:  ct chest  2.22.10  IMPRESSION:     Bibasilar strandy density likely representing atelectatic changes or   scarring.    mri abdomen 3.10.09  mpression:    1. Multiple hepatic cysts. 2. Irregular area of somewhat linear hyperintense T2 signal in the uncinate process, possibly representing a dilated sidebranch, could represent a small intraductal papillary mucinous neoplasm. Correlation with endoscopic ultrasound is recommended.**** end of result ****      Previous Other:  3.5.09 ct chest  mpression:    1. Minimal patchy areas of dependent lower lobe groundglass opacity These areas could represent dependent atelectasis, however could also represent mild interstitial lung disease. This could be further evaluated followup prone HRCT to determine whether this persists or resolves with  prone positioning.    2. Mild areas of air trapping due to small airway disease or reactive disease.    3. Three low density lesions within the liver, the largest measuring fluid density, which are incompletely characterized on the current study. **** end of result ***LABS REVIEWED    Glucose Ser: 147 (04/15/2008 2:15:00 PM)    BUN: 26 (04/15/2008 2:15:00 PM)    Creatinine: 1.3 (04/15/2008 2:15:00 PM)    BUN/CREAT: 20 (04/15/2008 2:15:00 PM)    Chloride: 102 (04/15/2008 2:15:00 PM)    Calcium (Serum): 9.0 (04/15/2008 2:15:00 PM)    Sodium: 140 (04/15/2008 2:15:00 PM)    Potassium: 4.3 (04/15/2008 2:15:00 PM)    AST: 15 (04/15/2008 2:15:00 PM)    ALT: 18 (04/15/2008 2:15:00 PM)    Albumin: 4.4 (04/15/2008 2:15:00  PM)    Total Bilirubin: 0.5 (04/15/2008 2:15:00 PM)    Total Protein: 6.8 (04/15/2008 2:15:00 PM)    PT: 25.3 (12/05/2007 9:06:00 AM)    INR: 2.6 (12/05/2007 9:06:00 AM)    PSA: 0.6 (04/15/2008 2:15:00 PM)  Serum Cholesterol 163 (04/15/2008 2:15:00 PM)  LDL: 95 (04/15/2008 2:15:00 PM)  HDL: 31 (04/15/2008 2:15:00 PM)  Triglyceride: 186 (04/15/2008 2:15:00 PM)    Hgb  A1C:: 7.2 (04/15/2008 2:15:00 PM)             Coronary Risk Factor Assessment   Cigarette Smoker: non-smoker     Primary Care Physician:   Primary Care Provider: Newman Pies MD            ]

## 2008-06-17 NOTE — Unmapped (Signed)
Signed by Eda Keys MD on 06/17/2008 at 00:00:00  [facility]      Imported By: Betsey Amen 07/11/2008 08:10:28    _____________________________________________________________________    External Attachment:    Please see [employer] EMR for this document.

## 2008-06-23 NOTE — Unmapped (Signed)
Signed by Eda Keys MD on 06/23/2008 at 00:00:00  Cardiology ROS      Imported By: Coletta Memos 07/07/2008 12:12:11    _____________________________________________________________________    External Attachment:    Please see [employer] EMR for this document.

## 2008-06-23 NOTE — Unmapped (Addendum)
Signed by Eda Keys MD on 07/01/2008 at 09:26:00    [facility]  CARDIOLOGY NEW PATIENT VISIT    History of Present Illness   [name] is a 68 Years Old Male who was seen at the request of L. Newman Pies, MD.  Thank you for allowing me to participate in the care of [name]. I had the pleasure of seeing [name] in our office in further followup to his recent admission to [facility] with complaints of chest pain.     As you may recall, [name] was admitted to [facility] with complaints of chest pain which did not appear anginal to me. However, on a CAT scan of his chest he appeared to have focal herniation of the lung parenchyma between the intercostal spaces on the left anterior chest.     Reportedly, soon after his discharge from [facility], when he attempted to use the CPAP machine that night, he had tearing chest pain. This prompted him to call Dr. Morley Kos who had performed his mini maze procedure in the past. Dr. Sheppard Penton subsequently repaired the hernia by placing a mesh similar to any other herniorrhaphy. [name] has recovered well from that surgery and has not had any recurrent chest discomfort other than his incisional soreness. He has been able to effectively use his CPAP machine without any problems.    At the time of our evaluation [name] denied any cardiopulmonary complaints and did not appear to be in any significant distress.           Past History  Past Medical History (reviewed - no changes required):  Atrial Fibrillation, Hyperlipidemia, Hypertension, Dilated Cardiomyopathy, Diabetes Type II, Hypothyroidism, sleep apnea  Surgical History (reviewed - no changes required):  Septoplasty: ., UPPP, Hernia Surgery: right inguinal, No problems with anesthesia    Family History (reviewed - no changes required): Mother - dm2, htn  Sister - dm2, cad  Brother - dm2, cad, htn  Social History (reviewed - no  changes required): Marital Status: married,   Employment Status: retired,   Patient Lives at: home,   Support System: excellent  Exercise: not due to foot,   Caffeine per Day: 0  Seatbelt Use: 100 % of the time  Drug Use: none  Tobacco Usage:non-smoker    Coronary Risk Factor Assessment   Cigarette Smoker: non-smoker       Review of Systems  See scanned document dated June 23, 2008.  The Review of Systems has been reviewed and the patient advised to follow up with PCP for non-cardiac problems.      Intake   Intake Comments: Seen at [facility]- [facility] pt to [facility].      Medications   PRAVACHOL 40 MG TABS (PRAVASTATIN SODIUM) one by mouth every evening  DIOVAN HCT 320-25 MG TABS (VALSARTAN-HYDROCHLOROTHIAZIDE) one by mouth daily  COREG 25 MG TABS (CARVEDILOL) one by mouth twice daily  LEVOTHYROXINE SODIUM 100 MCG TABS (LEVOTHYROXINE SODIUM) 1 by mouth daily  SAW PALMETTO EXTR (SAW PALMETTO) 1 by mouth daily  GLUCOSAMINE SULFATE POWD (GLUCOSAMINE SULFATE)   VIAGRA 100 MG TABS (SILDENAFIL CITRATE) one-half of a pill daily prn  LIDEX 0.05 % CREA (FLUOCINONIDE) apply twice a day  GLUCOVANCE 2.5-500 MG TABS (GLYBURIDE-METFORMIN) one by mouth twice a day  OMEPRAZOLE 40 MG CPDR (OMEPRAZOLE) one by mouth daily  LASIX 40 MG TABS (FUROSEMIDE) one by mouth every other day  Allergies  No Known Allergies    Vital Signs    Weight: 271 lbs.   Weight change of 1 lbs since previous visit.  Pulse rate: 76    Pulse rhythm: regular    Respirations: 20  BP #1: 130 / 76 mm Hg  Cuff Size: Std   O2 Saturation: 94% room air at rest   Intake recorded by: Marla Roe RN  June 23, 2008 3:34 PM      PHYSICAL EXAMINATION  Constitutional: alert, no acute distress, well hydrated, well developed, well nourished, appropriate for age, obese appearing.   Skin: normal color, no rashes, no unusual bruising, warm to touch.   Head: atraumatic, normocephalic.   Eyes: pupils equal, no injection, no icterus.   Ears/Nose/Throat: external ears normal, external nose  normal, hearing normal.   Neck: no carotid bruits, no thyromegaly.   Chest: no deformities, no apparent respiratory distress, normal percussion, no chest wall tenderness, clear to auscultation, normal breath sounds.   Neck Veins: not distended.   Carotids: normal upstroke, no bruits.   Palpation: normal.   S1 S2: normal S1 S2.   Extra Sounds: no gallop or rub or click.   Rhythm: regular rate and rhythm.   Ectopy: without ectopy.   Bilateral Lower Extremity Edema: none.   Murmur: none.   Radial - Right: normal.   Radial - Left: normal.   Abdomen: nondistended, nontender, normal BS, no organomegaly, no prominent aortic pulsation.   Spine: normal mobility, no deformities.   Extremities: full joint motion, no deformities.   Neuro: cranial nerves grossly intact, non focal.   Psych: affect and mood appropriate, normal interaction.     EKG Interpretation:   Date: 06/23/2008  No EKG done at this visit.      Assessment and Plan     Plan   Impression:    1.  Chest pain, which now appears to be due to the herniation of the pulmonary parenchyma between the intercostal spaces, very likely as a result of a complication of the thoracoscopic procedure. [name] has now well recovered and well healed. I shall obtain a CAT scan of his chest again to reassess the repair and the radiologist, Dr. Sherolyn Buba, was also notified.    2.  Paroxysmal atrial fibrillation. [name] appeared to be in a regular rhythm. He will continue the carvedilol 25 mg p.o. b.i.d., aspirin 81 mg p.o. daily.    3.  Hypertension. [name] blood pressure appeared to be reasonably controlled on the combination of carvedilol 25 mg p.o. b.i.d. and Diovan/hydrochlorothiazide 320/25 mg p.o. daily. He will try to adhere to a salt-restricted diet.    4.  Hyperlipidemia. He will continue to take the Pravachol at 40 mg p.o. daily in addition to a low-fat, cholesterol-free diet.     [name] was strongly counseled on therapeutic lifestyle modifications  comprising of dietary restrictions and exercises with a goal towards weight reduction eventually to his ideal body weight. He wishes to comply. 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    5.  Coronary artery disease, from which he appears to be stable. We will continue to follow him on his present regimen of medications.    Thank you once again for the opportunity to participate in Mr. Shean care. We shall be happy to see him in followup in approximately 3 months, or sooner,  if clinically indicated. Please do not hesitate to contact us in the interim if we may be of any further assistance.    Best personal regards.          Today's Orders   CT, Chest, w/o contrast [[id number]]    DISPOSITION:    Return to clinic in 3 month(s)      Counseling:   Atrial Fibrillation  Blood Pressure Management  Diabetes Management  Diet  Exercise  Lipid Management  Weight Management  Time Spent With Patient:   Time spent with patient: 35 minutes  Time spent discussing diagnosis, management and treatment plan: >35 minutes      CC:    Neack MD  L. Newman Pies, MD                    ]  Signed by Nigel Bridgeman on 07/01/2008 at 14:44:37          [facility]  CARDIOLOGY NEW PATIENT VISIT              Coronary Risk Factor Assessment   Cigarette Smoker: non-smoker       Review of Systems  See scanned document dated July 01, 2008.  The Review of Systems has been reviewed and the patient advised to follow up with PCP for non-cardiac problems.      Medications   PRAVACHOL 40 MG TABS (PRAVASTATIN SODIUM) one by mouth every evening  DIOVAN HCT 320-25 MG TABS (VALSARTAN-HYDROCHLOROTHIAZIDE) one by mouth daily  COREG 25 MG TABS (CARVEDILOL) one by mouth twice daily  LEVOTHYROXINE SODIUM 100 MCG TABS (LEVOTHYROXINE SODIUM) 1 by mouth daily  SAW PALMETTO EXTR (SAW PALMETTO) 1 by mouth daily  GLUCOSAMINE SULFATE POWD  (GLUCOSAMINE SULFATE)   VIAGRA 100 MG TABS (SILDENAFIL CITRATE) one-half of a pill daily prn  LIDEX 0.05 % CREA (FLUOCINONIDE) apply twice a day  GLUCOVANCE 2.5-500 MG TABS (GLYBURIDE-METFORMIN) one by mouth twice a day  OMEPRAZOLE 40 MG CPDR (OMEPRAZOLE) one by mouth daily  LASIX 40 MG TABS (FUROSEMIDE) one by mouth every other day    Allergies  No Known Allergies    Vital Signs   Smoking Status: non-smoker              Assessment and Plan       CC:    Neack MD                    ]

## 2008-06-27 NOTE — Unmapped (Signed)
Signed by   [employer] on 06/27/2008 at 13:12:21  Patient: [name]  Note: All result statuses are Final unless otherwise noted.    Tests: (1) CT-CHEST W/O CONTRAST ([id number])    Order Note: [employer] Accession Number: [id number]    Order Note:                              [facility]     Patient: [name], [name]   DOB:     May 28, 1940   MRN:     [id number]   FIN:       [id number]   Accn#:   [id number]                                   Computed Tomography     Exam                       Exam Date/Time       Ordering Physician   CT-CHEST W/O CONTRAST      06/27/2008 12:46 EDT Coretha Creswell     Reason for Exam   CHEST PAIN     Addendum   ADDENDUM:     The strandy opacity seen in the subcutaneous fat in the area of interval   surgery likely is related to postsurgical changes with subcutaneous   bleeding. However, if there is concern of secondary section are   inflammatory change, follow-up imaging should be performed to assure   improvement.   ********** VERIFIED REPORT **********     Dictated: 06/27/2008 1:07 pm      Nogueira M.D., Marya Landry   Signed (Electronic Signature):  Nogueira M.D., Marya Landry         06/27/08   1:12     Technologist: SRG     Report   CT OF THE CHEST WITHOUT CONTRAST     HISTORY: Chest pain.     TECHNICAL FACTORS: Noncontrast axial images were obtained through the   chest.     Comparison is made with prior study dated 06/02/2008     FINDINGS:     They are postoperative changes seen bilaterally in the chest wall in the   area prior lung herniation. There is also focal thickening of the pleura   seen in the area where interval surgery has been performed. The producing   noted bilateral lung herniations are no longer present.     No gross mediastinal or hilar adenopathy is seen.     No gross parenchymal infiltrate or effusion is seen.     Limited views of the upper abdomen show persistent hypodense hepatic   lesions likely representing cysts.     IMPRESSION:     Interval  postsurgical changes noted in the chest wall bilaterally with   focal associated areas of pleural thickening in locations of prior   bilateral lung herniations. No lung herniation is seen currently.      Report revised on 06/27/2008 13:12 EDT by Sherolyn Buba M.D., Marya Landry     ********** VERIFIED REPORT **********     Dictated: 06/27/2008 1:03 pm      Nogueira M.D., Marya Landry   Signed (Electronic Signature):  Sherolyn Buba M.D., Marya Landry         06/27/08   1:07     Technologist: Wilnette Kales    Order Note:  EMR Routing to: Mearl Latin - copy to - [id number]  EMR Routing to: [facility] - ordering - [id number]    Note: An exclamation mark (!) indicates a result that was not dispersed into   the flowsheet.  Document Creation Date: 06/27/2008 1:12 PM  _______________________________________________________________________    (1) Order result status: Final  Collection or observation date-time: 06/27/2008 12:46:20  Requested date-time: 06/27/2008 06:00:00  Receipt date-time:   Reported date-time: 06/27/2008 13:12:00  Referring Physician: Doyne Keel Jeymi Hepp  Ordering Physician: Doyne Keel Arvis Miguez [facility])  Specimen Source: RAD&Rad Type  Source: QRS  Filler Order Number: [id number]  Lab site: [facility]

## 2008-07-07 NOTE — Unmapped (Signed)
Signed by Eda Keys MD on 07/07/2008 at 00:00:00  Outside Medical Records      Imported By: Maryellen Pile 07/07/2008 13:32:08    _____________________________________________________________________    External Attachment:    Please see [employer] EMR for this document.

## 2008-07-07 NOTE — Unmapped (Signed)
Signed by [name] MD on 07/07/2008 at 20:25:35      Reason for Visit   Chief Complaint: red itchy rash on trunk, arms, legs  was taking niaspan for the past 6 weeks and stopped it 3 days ago.  noticed he was having sob while on the med,  which has improved since stopping the niaspan    History from: patient    Allergies  No Known Allergies    Medications     Vital Signs:   Wt: 273 lbs.      Wt chg (lbs): 2  Temperature: 97.3  degrees F  oral    Patient appears to be in acute distress: no  BP: 152/84    Pulse Oximetry:   O2 Saturation: 98 % w/ room air   Intake recorded by: Sheryle Spray MA on July 07, 2008 1:02 PM    History of Present Illness   As noted in CC above.     Pt with rash which started 1.5 weeks ago. Has spread, despite stopping the Niaspan 3 days ago. Rash started behind RLE - spread up leg to groin; back; UE's. Pruritic rash.     Pt went to pharmacy, advised to try Benadryl - didn't seem to help.     Pt started Niaspan about 6 weeks age. Experienced SOB before the niaspan, but was worse once he started the Niaspan.    Pt had cardiac surgery Dec 28, 09. 5 months later, pt went to ED - lungs 'protruding through the ribs' where the incisions were placed for the original cardiac surgery- mesh was placed and pt was placed on antibiotics (pt cannot recall).    Pt was eval by cardiology last week - CT scan done to review the mesh work to ensure everything remained intact.      PT thinks SOB even better after stopping Niaspan.   Pt reports feeling SOB with strenuous work. Pt had stress testing and ECHO in Feb - has been eval by cardiology.   Pt was originally rx'd Niaspan by Cardiology (former cardiologist, Dr. Lestine Box, [facility] Hag[facility].). New cardiologist - Dr. Elson Clan    BPs at home have been running 130's/60-80.          Physical Examination:   BP: 152/  84   Blood Pressure updated by physician.    Physical Exam- Detail:   General Appearance: well-developed, well-nourished and in no acute distress.  Skin:  Blanchable, macular-papular rash; confluent on back. Pt also has rash with scaling noted on extremities (pt states this is being monitored by dermatology)  Oropharynx: Normal appearance.  No erythema, exudate or mass. No tonsillar swelling.  Respiratory: Respiration un-labored.  Lung fields clear to auscultation.  No wheezing, rales, rhonchi or pleural rub.  Chest: scarring noted from surgery - pectoral region - well-healed.  Cardiac: S1 and S2 normal.  RRR without murmurs, rubs, gallops.  No JVD.  Vascular:  No edema or varicosities.  Abdomen: No masses or tenderness. Bowel sounds active x4 quad.  Liver and spleen are without tenderness or enlargement.  No hernias.           New Problems:  CONTACT DERMATITIS&OTHER ECZEMA DUE UNSPEC CAUSE (ICD-692.9)  New Medications:  MEDROL (PAK)  TABS (METHYLPREDNISOLONE TABS) take as directed until gone  ANACIN 81 MG TBEC (ASPIRIN)       Preventive Maintenance       Coordinating Care Providers   PCP Name: Newman Pies MD  Prescriptions:  MEDROL (PAK)  TABS (METHYLPREDNISOLONE TABS) take as directed until gone  #1 x 0   Entered and Authorized by: Kerin Perna NP   Signed by: Kerin Perna NP on 07/07/2008   Method used: Print then Give to Patient   RxID: [id number]      Assessment and Plan     Problems New Problems:  Dx of CONTACT DERMATITIS&OTHER ECZEMA DUE UNSPEC CAUSE (ICD-692.9)  Onset: 07/07/2008    Medications   New Prescriptions/Refills:  MEDROL (PAK)  TABS (METHYLPREDNISOLONE TABS) take as directed until gone  #1 x 0, 07/07/2008, Kerin Perna NP    Today's Orders   [phone] - Ofc Vst, Est Level III [[id number]]    Patient Instructions   Hold off on the niaspan for now - unsure if this was the cause of the rash.     Start medrol steroid pack.     If rash persists, despite steroid, likely not the Niaspan. Also, likely not allergy. May need to have you follow up woth Dr. Beverlyn Roux in [place], if this is the case.    Please let your cardiologist know you stopped the  Niaspan. We are trying to see if this is cause of the rash.    Follow up with Dr Newman Pies in May for routine labs - will need to check your lipids then.

## 2008-09-25 NOTE — Unmapped (Signed)
Signed by Kerrie Pleasure MA on 09/25/2008 at 13:47:12    [facility] - PRELOAD      Preload Clinical Lists   Problems:   CONTACT DERMATITIS&OTHER ECZEMA DUE UNSPEC CAUSE (ICD-692.9)  CHEST PAIN UNSPECIFIED (ICD-786.50)  ? 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.8)  HEMORRHOIDS (ICD-455.6)  INSECT BITE (ICD-919.4)  HYPOTHYROIDISM NOS (ICD-244.9)  DIABETES MELLITUS (ICD-250.00)  SCREENING FOR MALIGNANT NEOPLASM, PROSTATE (ICD-V76.44)  HYPERTENSION (ICD-401.9)  HYPERLIPIDEMIA (ICD-272.4)  ATRIAL FIBRILLATION (ICD-427.31)  CHF (ICD-428.0)    Medications:   PRAVACHOL 40 MG TABS (PRAVASTATIN SODIUM) one by mouth every evening  DIOVAN HCT 320-25 MG TABS (VALSARTAN-HYDROCHLOROTHIAZIDE) one by mouth daily  COREG 25 MG TABS (CARVEDILOL) one by mouth twice daily  LEVOTHYROXINE SODIUM 100 MCG TABS (LEVOTHYROXINE SODIUM) 1 by mouth daily  SAW PALMETTO EXTR (SAW PALMETTO) 1 by mouth daily  GLUCOSAMINE SULFATE POWD (GLUCOSAMINE SULFATE)   VIAGRA 100 MG TABS (SILDENAFIL CITRATE) one-half of a pill daily prn  GLUCOVANCE 2.5-500 MG TABS (GLYBURIDE-METFORMIN) one by mouth twice a day  OMEPRAZOLE 40 MG CPDR (OMEPRAZOLE) one by mouth daily  MEDROL (PAK)  TABS (METHYLPREDNISOLONE TABS) take as directed until gone  ANACIN 81 MG TBEC (ASPIRIN)       Allergies:  No Known Allergies  PAST HISTORY  Past Medical History (reviewed - no changes required):  Atrial Fibrillation, Hyperlipidemia, Hypertension, Dilated Cardiomyopathy, Diabetes Type II, Hypothyroidism, sleep apnea  Surgical History (reviewed - no changes required):  Septoplasty: ., UPPP, Hernia Surgery: right inguinal, No problems with anesthesia    Family History (reviewed - no changes required): Mother - dm2, htn  Sister - dm2, cad  Brother - dm2, cad, htn  Social History (reviewed - no changes required): Marital Status: married,   Employment Status: retired,   Patient Lives at: home,   Support  System: excellent  Exercise: not due to foot,   Caffeine per Day: 0  Seatbelt Use: 100 % of the time  Drug Use: none  Tobacco Usage:non-smoker    Previous Echocardiogram:  2.22.10 echo  Study Conclusions     - Left ventricle: The cavity size was normal. Wall thickness was     normal. Systolic function was normal. The estimated ejection     fraction was in the range of 60% to 65%. Wall motion was normal;     there were no regional wall motion abnormalities. Left     ventricular diastolic function parameters were normal.   - Mitral valve: Mild regurgitation.   - Left atrium: The atrium was mildly dilated.   - Pulmonic valve: Peak gradient: 3mm Hg (S).    Previous Nuclear Study:  2.22.10 stress  Study Conclusions     - Stress ECG conclusions: There were no stress arrhythmias or     conduction abnormalities. The stress ECG was negative for     ischemia to submaximal level of exercise - inconclusive.   - Stress echo: There was no echocardiographic evidence for     stress-induced ischemia.   Impressions: Nondiagnostic for ischemia, due to failure to reach   target heart rate. Patient was asymptomatic, without any associated   ECG or echocardiographic changes, to achieved level of exercise   (73% of target heartrate).   Normal stress test to submaximal  level of exercise - inconclusive.   STRESS ECHO REPORT IN VERICIS AND EKG STRIPS IN MUSE   Confirmed by SUPERVISING, CARDIOLOGIST ([id number]), editor  WESTERMEYER, MARILYN ([id number]) on 06/02/2008 2:45:21 PM      Previous EKG/Holter/Event Monitor:  2.22.10 ekg  Ventricular Rate  63 BPM   Atrial Rate  63 BPM   P-R Interval  208 ms   QRS Duration  106 ms   QT  468 ms   QTc  478 ms   P Axis  -21 degrees   R Axis  -7 degrees   T Axis  68 degrees     NORMAL SINUS RHYTHM   MINIMAL VOLTAGE CRITERIA FOR LVH, MAY BE NORMAL VARIANT   NONSPECIFIC T WAVE CHANGE   NONSPECIFIC INTRAVENTRICULAR CONDUCTION DELAY   LEFT AXIS DEVIATION, LEFT ANTERIOR HEMIBLOCK   ABNORMAL ECG    Previous 64 Slice Cardiac  CT/MRI:  cardiac ct 9.24.09  MPRESSION:    Agatston calcium score of 87. No evidence of pulmonary vein stenosis.Standard pulmonary venous anatomy.    Tiny orificial branches in the right inferior pulmonary vein.    No evidence of a left atrial appendage thrombus. The left atrial appendage travels superior and anterior to rest lateral to the pulmonary outflow tract. Please note that the first obtuse marginal branch is immediately anterior to the base of the left atrial appendage.      Standard coronary anatomy with right coronary dominance.    Minimal calcified and noncalcified plaque in coronary circulation with no evidence of significant stenosis. Please note that there is calcified plaque at the origin of D1. It is difficult to assess the marked narrowing caused that this plaque as the D1 branch is small. Mild narrowing at the origin of D2 caused by calcified plaque.    Groundglass opacity along the right major fissure. Consider dedicated chest CT in 12 months to ensure stability.    Hepatic cyst.    Dilation and mild wall thickening of the esophagus throughout its course. Although this could be related to nondistention consider further evaluation with upper GI or endoscopy. **** end of result ****    Previous Radiology:  3.19.10 ct chest  IMPRESSION:   Interval postsurgical changes noted in the chest wall bilaterally with   focal associated areas of pleural thickening in locations of prior   bilateral lung herniations. No lung herniation is seen currently.      Report revised on 06/27/2008 13:12 EDT by Sherolyn Buba M.D., Marya Landry     ********** VERIFIED REPORT **********     Dictated: 06/27/2008 1:03 pm      Nogueira M.D., Marya Landry   Signed (Electronic Signature):  Nogueira M.D., Maisie Fus E         03/19/1    ct chest  2.22.10  IMPRESSION:     Bibasilar strandy density likely representing atelectatic changes or   scarring.    mri abdomen 3.10.09  mpression:    1. Multiple hepatic cysts. 2. Irregular area of somewhat  linear hyperintense T2 signal in the uncinate process, possibly representing a dilated sidebranch, could represent a small intraductal papillary mucinous neoplasm. Correlation with endoscopic ultrasound is recommended.**** end of result ****      Previous Other:  3.5.09 ct chest  mpression:    1. Minimal patchy areas of dependent lower lobe groundglass opacity These areas could represent dependent atelectasis, however could also represent mild interstitial lung disease. This could be further evaluated followup prone HRCT to determine whether this persists or resolves with prone positioning.    2. Mild areas of air trapping due to small airway disease or reactive disease.    3.  Three low density lesions within the liver, the largest measuring fluid density, which are incompletely characterized on the current study. **** end of result ***PT: 25.3 (12/05/2007 9:06:00 AM)  INR: 2.6 (12/05/2007 9:06:00 AM)  HgbA1C 7.2 (04/15/2008 2:15:00 PM)  Serum Glucose: 147 (04/15/2008 2:15:00 PM)  Total Protein: 6.8 (04/15/2008 2:15:00 PM)  Albumin: 4.4 (04/15/2008 2:15:00 PM)  AST: 15 (04/15/2008 2:15:00 PM)  ALT: 18 (04/15/2008 2:15:00 PM)  Alkaline Phosphatase: 83 (04/15/2008 2:15:00 PM)  Total Bilirubin: 0.5 (04/15/2008 2:15:00 PM)  Serum Cholesterol 163 (04/15/2008 2:15:00 PM)  LDL: 95 (04/15/2008 2:15:00 PM)  HDL: 31 (04/15/2008 2:15:00 PM)  Triglyceride: 186 (04/15/2008 2:15:00 PM)  Sodium: 140 (04/15/2008 2:15:00 PM)  Potassium: 4.3 (04/15/2008 2:15:00 PM)  Chloride: 102 (04/15/2008 2:15:00 PM)  BUN: 26 (04/15/2008 2:15:00 PM)  Creatinine: 1.3 (04/15/2008 2:15:00 PM)  Serum Calcium: 9.0 (04/15/2008 2:15:00 PM)  Serum Phospate: 3.2 (10/08/2007 2:48:00 PM)  Microalbumin/Cr: 0.9 (04/15/2008 2:15:00 PM)  PSA: 0.6 (04/15/2008 2:15:00 PM)        Coronary Risk Factor Assessment   Cigarette Smoker: non-smoker     Primary Care Physician:   Primary Care Provider: [facility] MD    Other Coordinating Care Providers   Consult Requested By: L.  Newman Pies, MD            ]

## 2008-10-08 ENCOUNTER — Encounter

## 2008-10-09 LAB — COMPREHENSIVE METABOLIC PANEL
ALT: 26 U/L (ref 10–40)
AST: 26 U/L (ref 15–37)
Albumin/Globulin Ratio: 1.8 (ref 1.1–2.2)
Albumin: 4.3 g/dL (ref 3.4–5.0)
Alkaline Phosphatase: 80 U/L
BUN: 23 mg/dL — ABNORMAL HIGH (ref 7–18)
CO2: 29 meq/L (ref 21–32)
Calcium: 9.6 mg/dL (ref 8.3–10.6)
Chloride: 103 meq/L (ref 99–110)
Creatinine: 1.3 mg/dL (ref 0.8–1.3)
GFR Est, African/Amer: >60
GFR, Estimated: 59 — ABNORMAL LOW (ref >60)
Glucose: 165 mg/dL — ABNORMAL HIGH (ref 70–99)
Potassium: 4.9 meq/L (ref 3.5–5.1)
Sodium: 142 meq/L (ref 136–145)
Total Bilirubin: 0.5 mg/dL (ref 0.0–1.0)
Total Protein: 6.8 g/dL (ref 6.4–8.2)

## 2008-10-09 LAB — LIPID PANEL
Cholesterol, Total: 148 mg/dl (ref <200)
HDL: 28 mg/dL — ABNORMAL LOW (ref 40–60)
LDL Calculated: 90 mg/dl (ref <100)
Triglycerides: 148 mg/dl (ref <150)
VLDL Cholesterol Calculated: 30 mg/dl

## 2008-10-09 LAB — T4, FREE: T4 Free: 1.53 ng/dL (ref 0.9–1.8)

## 2008-10-09 LAB — TSH
T4 Free: 1.53 ng/dL (ref 0.9–1.8)
TSH: 0.64 u[IU]/mL (ref 0.35–5.5)

## 2008-10-09 LAB — PSA SCREENING: PSA: 0.53 ng/ml

## 2008-10-09 NOTE — Assessment & Plan Note (Signed)
Check lab

## 2008-10-09 NOTE — Assessment & Plan Note (Signed)
May be worse  Check a1c

## 2008-10-09 NOTE — Assessment & Plan Note (Signed)
Cont inue med

## 2008-10-09 NOTE — Progress Notes (Signed)
Subjective:      Patient ID: Carl Blair is a 68 y.o. male.    HPI    Dyspnea with exertion  Can walk 2-3 miles without problem  Carrying weight up steps, dyspnea  Running the sweeper doesn't affect it  No chest pain  Lifting anything heavy does this  Had gxt 4 months ago  Dyspnea has been like this since the minimaze/lung herniation  Gets soreness right chest after four hours on cpap, goes away off of it  May need repeat ct scan    Fasting today    Glucoses "high" 180 in the am  2 hr pp often 200  Lost 12 pounds watching diet deliberately, has helped some already  Exercising almost every day      Review of Systems   Constitutional: Negative for chills, fatigue and unexpected weight change.        See lap band doctor tomorrow   Respiratory: Positive for shortness of breath. Negative for cough (occ) and choking.    Cardiovascular: Negative for chest pain and leg swelling.   Gastrointestinal: Negative for abdominal pain and abdominal distention.   Neurological: Negative for weakness, light-headedness and headaches (better with weight loss).   Hematological: Negative for adenopathy. Does not bruise/bleed easily.       Objective:   Physical Exam   Constitutional: He is oriented to person, place, and time. He appears well-developed and well-nourished.   HENT:   Head: Normocephalic and atraumatic.   Eyes: Pupils are equal, round, and reactive to light. No scleral icterus.   Neck: Normal range of motion. No JVD present.   Cardiovascular: Normal rate, normal heart sounds and intact distal pulses.    Pulmonary/Chest: Effort normal and breath sounds normal.   Abdominal: Soft. Bowel sounds are normal.   Musculoskeletal: Normal range of motion. He exhibits no edema.   Neurological: He is alert and oriented to person, place, and time.        Feet -- no sores seen  Normal pulses  Filament test normal bilaterally     Skin: Skin is warm and dry.   Psychiatric: He has a normal mood and affect. His behavior is normal. Judgment  and thought content normal.       Assessment:        1. Special Screening for Malignant Neoplasm of Prostate (V76.44)  PSA SCREENING   2. Unspecified Hypothyroidism (244.9)  TSH, T4, FREE   3. DM w/o Complication Type II (250.00)  HEMOGLOBIN A1C   4. UNSPECIFIED ESSENTIAL HYPERTENSION (401.9)     5. OTHER AND UNSPECIFIED HYPERLIPIDEMIA (272.4)  COMPREHENSIVE METABOLIC PANEL, LIPID PANEL   6. DYSPNEA (786.09EC)  CT CHEST WO CONTRAST   7. CONGESTIVE HEART FAILURE, UNSPECIFIED (428.0)                 Plan:        See progress note.

## 2008-10-09 NOTE — Assessment & Plan Note (Signed)
Appears stable  Dyspnea may be related to recent lung hernia surgery  Check ct

## 2008-10-10 ENCOUNTER — Inpatient Hospital Stay

## 2008-10-10 LAB — HEMOGLOBIN A1C
A1c: 7.3 — ABNORMAL HIGH (ref 4.0–6.0)
eAG: 162.8 mg/dl

## 2008-10-10 NOTE — Unmapped (Signed)
Signed by Dimas Chyle MD on 10/10/2008 at 12:36:09      Vital Signs   Height: 72 in.    Weight: 276 lbs.   Respirations: 18   Pulse rate: 72     Blood pressure: 112/ 88 mmHg     Weight changed since last visit: 3 lbs.      BMI (in-lb): 37.57                 BSA (m2): 2.45       Ideal Body Weight: 178     Excess Body Weight: 98          Excess Body Weight Lost: 155.06 %      Allergies  No Known Allergies    Medications   PRAVACHOL 40 MG TABS (PRAVASTATIN SODIUM) one by mouth every evening  DIOVAN HCT 320-25 MG TABS (VALSARTAN-HYDROCHLOROTHIAZIDE) one by mouth daily  COREG 25 MG TABS (CARVEDILOL) one by mouth twice daily  LEVOTHYROXINE SODIUM 100 MCG TABS (LEVOTHYROXINE SODIUM) 1 by mouth daily  SAW PALMETTO EXTR (SAW PALMETTO) 1 by mouth daily  GLUCOSAMINE SULFATE POWD (GLUCOSAMINE SULFATE)   VIAGRA 100 MG TABS (SILDENAFIL CITRATE) one-half of a pill daily prn  GLUCOVANCE 2.5-500 MG TABS (GLYBURIDE-METFORMIN) one by mouth twice a day  OMEPRAZOLE 40 MG CPDR (OMEPRAZOLE) one by mouth daily  ANACIN 81 MG TBEC (ASPIRIN)     Intake recorded by: Zenaida Deed. Yetta Flock, [place]  October 10, 2008 8:21 AM  Smoking Status: non-smoker   Medications reviewed, updated and verified with patient or patient representative.      HISTORY OF PRESENT ILLNESS  Chief Complaint: New patient consult for lap band  History from: patient  Additional Notes: - has had previous esophageal dilations.  he says he does currently feel restriction when eating steaks. bread sometimes gets stuck.  - was on steroids for breathing (March 2010)- he says he does not have to be on steroids every year (he says these were related to the lung hernia surgery)  - walks 2-3 miles a day  highest weight is 290  patient says he eats too much and makes bad choices.  he has been working w his wife who is doing Insurance claims handler - he has lost 10 lbs in last few weeks.  - has recently been having some SOA so he is getting a repeat CT today at [facility].    has an enlarged prostrate (does  not see a urologist), headaches, daytime drosiness, uses CPAP, CHF, high cholesterol (medicated), diabetes by mouth meds),  depression (no past treatment), memory lapses (names)    see attached packet of information for long term struggle with weight and documentation of previous attempts at weight loss.   - duration- long term obese  - severity- causing many medical problems  - previous treatments- multiple diets and lifestyle modifications  We reviewed diet and exercise activity as well as changes in her medical status.      PAST HISTORY  Past Medical History (reviewed - no changes required):  Atrial Fibrillation, Hyperlipidemia, Hypertension, Dilated Cardiomyopathy, Diabetes Type II, Hypothyroidism, sleep apnea  Surgical History (reviewed - no changes required):  Septoplasty: ., UPPP, Hernia Surgery: right inguinal, mini maze 9-09,  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  sleep apnea surgery, has had esophageal dilations in the past (Dr. Karolee Stamps), No problems with anesthesia, No problems with healing, No problems with blood clots after surgery    Family History (reviewed - no changes required):  Mother - dm2, htn  Sister - dm2, cad  Brother - dm2, cad, htn  Social History (reviewed - no changes required): Marital Status: married,   Employment Status: retired,   Patient Lives at: home,   Support System: excellent  Exercise: not due to foot,   Caffeine per Day: 0  Seatbelt Use: 100 % of the time  Drug Use: none  Tobacco Usage:non-smoker    Review of Systems  General: Denies any specific issues at this time.   Personal Limitations: Denies any specific issues at this time.   HEENT: Denies any specific issues at this time.   Cardiovascular: Complains of dizziness, high blood pressure, high cholesterol. right chest pain  Respiratory: Complains of shortness of breath, sleep apnea.   Gastrointestinal: Denies any specific issues at this time.   Genitourinary: Denies any specific issues at this time.    Musculoskeletal: Denies any specific issues at this time.   Skin: Denies any specific issues at this time.   Neurologic: Denies any specific issues at this time.   Psychiatric: Complains of overeat for emotions.   Endocrine: Complains of diabetes-adult, hypothyroidism.   Heme/Lymphatic: Complains of taking blood thinners.   Allergic/Immunologic: Denies any specific issues at this time.       Physical Exam   General Appearance: well-developed, well-nourished and in no acute distress    Eyes   External: anicteric sclera, conjunctiva not injected, lids no lesions or swelling  Pupils: no nystagmus, symmetric, PERRL, EOMI    Ear, Nose &Throat   External Ears: no lesions or deformities, no tenderness  External Nose: no lesions or deformities  Hearing: grossly intact    Neck   Neck Exam: supple, no masses, trachea midline    Respiratory   Respiratory Effort: breathing comfortably, no increased work of breathing    Gastrointestinal   Abdomen: soft, non-tender, non-distended, no masses,obese    Lymphatic   Neck: no cervical, supraclavicular or auricular adenopathy  Axillae: no axillary adenopathy  Misc. lymph nodes: no other adenopathy    Skin   Inspection: no lower extremity edema, rashes,    Musculoskeletal   Gait and Station: intact without difficulty  Head and neck: intact with full ROM, no tenderness    Neurologic   Cranial Nerves: II - XII grossly intact    Sensation: intact to light touch throughout    Mental Status   Judgement, insight: awake and alert  Orientation: oriented to time, place, and person  Memory: intact for recent and remote events  Mood and affect: no agitation, good eye contact      Assessment and Plan   ? esophageal stricture?  has an EGD scheduled (maybe an EUS due to a pancreatic mass on july 15) .  I also want him to get an UGI in the meantime and follow up after  we will discuss if the lapband is a good option in him.    Instructions for today's visit  30+ minutes were spent discussing w pt  options of weight loss surgery (Risks & benefits of both gastric bypass (the gold standard) and gastric banding.  Risks include infection, scarring, pain,need for later operations, bowel obstruction, stricture, malnutrition, failure to lose weight, weight regain, slippage/erosion/herniation (possible removal) of the band device, blood clots, heart & lung problems, leaks (up to 4%) and death (up to 1%). All depend on pt risk factors (weight,sex,age,comorbidities.)  I also ask patients to lower their risk by staying at same weight or losing between now and  surgery. A risk assessment was done of three or more chronic problems listed in the PMHx with complex decision making for the recommendaiton of procedure. Both require exercise. Expected weight loss- Lap band surgery - 50% of excess body weight (EBW), bypass usually 70%, with better resolution of diabetes. Either procedure may need to be completed open.      DISPOSITION:    Return to clinic in 3 week(s)        cc:   [name], MD  Dr. Elson Clan  Hello-  I saw Mr Mestre this morning- he is interested in lap band surgery.  I think he is a good candidate, I do have once concern.  He says he currently feels restriction with certain foods and has had to have esophageal dilation several times previously.  He has an EGD scheduled in a couple of weeks.  I have also asked him to do an UGI in the meantime to check if there is a stricture before we make the decision for surgery.  Thanks you for letting me see him.  Misty Stanley  Thank you for the opportunity to see Ms. MANNELLA in consultation for weight loss surgery.  As you know, at the [facility], we have a comprehensive surgical weight loss program with the focus on a team approach. Patients are evaluated and treated by dieticians, fitness specialists, and psychologists. Our medical staff includes specialized nurses and a PA-C.  Certainly, you are central to this success. Our goal is to assist patients with long term  weight loss.  The patient is a reasonable candidate for weight loss surgery.  In the preop period we need to accomplish a few tests and consultations prior to submission to the insurance company for final approval. These are listed in the assessment and plan in office note.  These always include a psychiatric evaluation.  If symptoms of sleep apnea, a sleep study.  .Thank you again for letting me participate in the care of your patient. I look forward to a successful weight loss partnership.    Sincerely,  Rada Hay, MD  Clinical Assistant Professor of Surgery  [facility]  [email or web address]                    ]

## 2008-10-10 NOTE — Unmapped (Signed)
Signed by Odie Sera Massett on 10/10/2008 at 09:13:51    Clinical Lists Changes    Orders:  Added new Test order of X-ray, Upper GI tract w/ Abdomen ([id number]) - Signed

## 2008-10-14 ENCOUNTER — Inpatient Hospital Stay

## 2008-10-14 NOTE — Unmapped (Signed)
Signed by   LinkLogic on 10/14/2008 at 11:02:28  Patient: [name]  Note: All result statuses are Final unless otherwise noted.    Tests: (1) DIAG-UGI AIR CONTRAST W/ABD [phone])    Order Note: [facility] Accession Number: [id number]    Order Note:                                 Dinah Beers     Patient: [name], [name]   DOB:     10-18-1940   MRN:     [id number]   FIN:       [id number]   Accn#:   [id number]                                  Diagnostic Radiology     Exam                       Exam Date/Time       Ordering Physician   DIAG-UGI AIR CONTRAST      10/14/2008 10:06 EDT MARTIN-HAWVER, Misty Stanley R   W/ABD     Reason for Exam   hiatal hernia     Report   Double-contrast upper GI series 10/14/2001     Clinical Indication: Hiatal hernia, esophageal stricture, evaluation for   surgical weight loss procedure.     Findings:     Standard double-contrast upper GI series was performed.     The esophagus, stomach, and duodenum demonstrate normal motility and   mucosa. There is no evidence of hiatal hernia. No gastroesophageal reflux   was demonstrated. Although there is suggestion of mild smooth narrowing of   the distal esophagus in the upright position, no significant stricture is   identified in the prone position. Visualized duodenum and proximal small   bowel demonstrate no abnormality.     There is no evidence of ulcer or mass.     Impression:     Mild narrowing of the distal esophagus only evident on the upright   position. No significant esophageal stricture is visible. No other   significant abnormalities are appreciated. No hiatal hernia or   gastroesophageal reflux demonstrated on this examination.   ********** VERIFIED REPORT **********     Dictated: 10/14/2008 11:01 am     UDSTUEN MD, York Cerise   Signed (Electronic Signature):  Barton Fanny MD, York Cerise             10/14/08   11:01     Technologist: JLS    Order Note:   EMR Routing to: Rada Hay R - ordering - [id number]    Note: An exclamation  mark (!) indicates a result that was not dispersed into   the flowsheet.  Document Creation Date: 10/14/2008 11:02 AM  _______________________________________________________________________    (1) Order result status: Final  Collection or observation date-time: 10/14/2008 10:06:33  Requested date-time: 10/14/2008 09:09:00  Receipt date-time:   Reported date-time: 10/14/2008 11:01:53  Referring Physician: Misty Stanley MARTIN-HAWVER  Ordering Physician: Misty Stanley MARTIN-HAWVER [facility])  Specimen Source: RAD&Rad Type  Source: QRS  Filler Order Number: [id number]  Lab site: [facility]

## 2008-10-20 NOTE — Telephone Encounter (Signed)
Please print the letter.

## 2008-10-20 NOTE — Telephone Encounter (Signed)
PT REQUESTING NEW RX FOR HANDICAP PLACARD.  PLEASE CALL WHEN READY TO PICK UP   [phone]

## 2008-10-21 NOTE — Telephone Encounter (Signed)
Pt informed letter ready to pick up

## 2008-10-30 NOTE — Unmapped (Signed)
Signed by Ammie Dalton on 10/30/2008 at 10:34:15                   [facility]         General Surgery         [address]         [place], [place] [address]         p [phone] f [phone]         [email or web address]            October 10, 2008              RE: DENVIL CANNING  DOB:   1941/03/07      Dear [name], MD,    I had the pleasure of seeing your patient, Monique Gift in the office today for surgical evaluation. Enclosed is a copy of my office note and recommendations regarding this patient.    Our office will be scheduling Mr. Granada for surgery and will keep you informed of his status.  Thank you for the opportunity to participate in Mr. Huot care and please do not hesitate to contact me with any questions.    Sincerely,      Dimas Chyle, MD  Assistant Professor of Surgery  GI/Endocrine Surgery          CC  Dr. Elson Clan

## 2008-11-07 NOTE — Unmapped (Signed)
Signed by Dimas Chyle MD on 11/07/2008 at 11:41:46      Vital Signs   Height: 72 in.    Weight: 265.5 lbs.     Blood pressure: 122/ 84 mmHg   Temperature: 98.6 degrees  F   Weight changed since last visit: -10.50 lbs.      BMI (in-lb): 36.14                 BSA (m2): 2.41       Ideal Body Weight: 178     Excess Body Weight: 87.50          Excess Body Weight Lost: 149.16 %      Allergies  No Known Allergies    Medications   PRAVACHOL 40 MG TABS (PRAVASTATIN SODIUM) one by mouth every evening  DIOVAN HCT 320-25 MG TABS (VALSARTAN-HYDROCHLOROTHIAZIDE) one by mouth daily  COREG 25 MG TABS (CARVEDILOL) one by mouth twice daily  LEVOTHYROXINE SODIUM 100 MCG TABS (LEVOTHYROXINE SODIUM) 1 by mouth daily  SAW PALMETTO EXTR (SAW PALMETTO) 1 by mouth daily  GLUCOSAMINE SULFATE POWD (GLUCOSAMINE SULFATE)   VIAGRA 100 MG TABS (SILDENAFIL CITRATE) one-half of a pill daily prn  GLUCOVANCE 2.5-500 MG TABS (GLYBURIDE-METFORMIN) one by mouth twice a day  OMEPRAZOLE 40 MG CPDR (OMEPRAZOLE) one by mouth daily  ANACIN 81 MG TBEC (ASPIRIN)     Intake recorded by: Damien Fusi  November 07, 2008 11:18 AM  Smoking Status: non-smoker   Medications reviewed, updated and verified with patient or patient representative.      HISTORY OF PRESENT ILLNESS  Chief Complaint: follow up UGI  History from: patient  Additional Notes: in regards to swallowing- she said he really just has to be careful when eating steak .  he said this is worse when he does nto chew his food.  when he is hungrier this is worse.  he has been working to lose weight but watching what he eats and walking 3 miles a day.  he has previously been on coumadin but no longer takes this bc he had an ablation.   he does not have acid reflux bc he takes medications  he has had atleast 2 dilations-    I did spent 20 minutes reviewing his records.  see attached packet of information for long term struggle with weight and documentation of previous attempts at weight loss.   -  duration- long term obese  - severity- causing many medical problems  - previous treatments- multiple diets and lifestyle modifications  We reviewed diet and exercise activity as well as changes in her medical status.    he did have an endo [place]- i do not have results, but he said that it was normal.      PAST HISTORY  Past Medical History (reviewed - no changes required):  Atrial Fibrillation, Hyperlipidemia, Hypertension, Dilated Cardiomyopathy, Diabetes Type II, Hypothyroidism, sleep apnea  Surgical History (reviewed - no changes required):  Septoplasty: ., UPPP, Hernia Surgery: right inguinal, mini maze 9-09,  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  sleep apnea surgery, has had esophageal dilations in the past (Dr. Karolee Stamps), No problems with anesthesia, No problems with healing, No problems with blood clots after surgery    Family History (reviewed - no changes required): Mother - dm2, htn  Sister - dm2, cad  Brother - dm2, cad, htn  Social History (reviewed - no changes required): Marital Status: married,   Employment Status: retired,   Patient Lives  at: home,   Support System: excellent  Exercise: not due to foot,   Caffeine per Day: 0  Seatbelt Use: 100 % of the time  Drug Use: none  Tobacco Usage:non-smoker    Review of Systems  General: Denies fevers, sweats, fatigue, loss of appetite, weight loss, weight gain.   Gastrointestinal: Denies blood in stool, liver disease, H. pylori infection, heartburn/acid reflux, stomach pain, stomach ulcers, gallstones, ulcerative colitis, Crohn's disease, stomach surgery, frequent constipation, frequent diarrhea, vomiting/nausea, fatty liver, Hepatitis A, Hepatitis B, Hepatitis C, hernia.       Physical Exam   General Appearance: well-developed, well-nourished and in no acute distress    Eyes   External: anicteric sclera, conjunctiva not injected, lids no lesions or swelling  Pupils: no nystagmus, symmetric, PERRL, EOMI    Ear, Nose &Throat   External Ears: no  lesions or deformities, no tenderness  External Nose: no lesions or deformities  Hearing: grossly intact    Neck   Neck Exam: supple, no masses, trachea midline    Respiratory   Respiratory Effort: breathing comfortably, no increased work of breathing    Gastrointestinal   Abdomen: soft, non-tender, non-distended, no masses,obese    Lymphatic   Neck: no cervical, supraclavicular or auricular adenopathy  Axillae: no axillary adenopathy  Misc. lymph nodes: no other adenopathy    Skin   Inspection: no lower extremity edema, rashes,    Musculoskeletal   Gait and Station: intact without difficulty  Head and neck: intact with full ROM, no tenderness    Neurologic   Cranial Nerves: II - XII grossly intact    Sensation: intact to light touch throughout    Mental Status   Judgement, insight: awake and alert  Orientation: oriented to time, place, and person  Memory: intact for recent and remote events  Mood and affect: no agitation, good eye contact      Assessment and Plan   1.  pt to get [place] the copies of the sleep study      discussed that the band works with restriction.  he already has intermittent restricive symptoms that have required dilations.  so it is possible that these symptoms will worsen or even be untolerable with the lap band and require that it be removed.  He understands this and wants the band anyway to assist with his weight loss.  we will continue with his workup.      30+ minutes were spent discussing w pt options of weight loss surgery (Risks & benefits of both gastric bypass (the gold standard) and gastric banding.  Risks include infection, scarring, pain,need for later operations, bowel obstruction, stricture, malnutrition, failure to lose weight, weight regain, slippage/erosion/herniation (possible removal) of the band device, blood clots, heart & lung problems, leaks (up to 4%) and death (up to 1%). All depend on pt risk factors (weight,sex,age,comorbidities.)  I also ask patients to lower their  risk by staying at same weight or losing between now and surgery. A risk assessment was done of three or more chronic problems listed in the PMHx with complex decision making for the recommendaiton of procedure. Both require exercise. Expected weight loss- Lap band surgery - 50% of excess body weight (EBW), bypass usually 70%, with better resolution of diabetes. Either procedure may need to be completed open.    plan lap band when preop testing is complete.      DISPOSITION:    Return to clinic after testing        cc:  [name], MD  Dr. Elson Clan                ]

## 2008-11-07 NOTE — Unmapped (Signed)
Signed by Odie Sera Massett on 11/07/2008 at 11:48:39    Clinical Lists Changes    Orders:  Added new Test order of B1, Thiamine   (VITMB1)  (5042) ([id number]) - Signed  Added new Test order of B-12  (B12)  (927) Kyung Rudd) - Signed  Added new Test order of CBC without Diff   (CBC) (1759) ([id number]) - Signed  Added new Test order of Ferritin Level   (FERRIT) (457)  ([id number]) - Signed  Added new Test order of Folic Acid  (FOLATE) (466) ([id number]) - Signed  Added new Test order of Hemoglobin  A1C   (GLYCO) (496)  ([id number]) - Signed  Added new Test order of Hepatic Function    (LIVP) (10256)  (CPT-80076) - Signed  Added new Test order of Iron & TIBC    (IS) (7573) ([id number]) - Signed  Added new Test order of Lipid Profile   (FATS) (7600) (CPT-80061) - Signed  Added new Test order of Magnesium Level  (MG) (622)  ([id number]) - Signed  Added new Test order of Renal Function Panel   (KIDNEY) (16606) ([id number]) - Signed  Added new Test order of Vitamin D, 25-Hydrodroxy  (NATFT73) (17306) ([id number]) - Signed      Process Orders  Check Orders Results:      Alert: EMR Link Lab: Order checked:      062376 -- Ferritin Level   (FERRIT) (457) -- ABN required due to diagnosis (CPT: 28315)      176160 -- Hemoglobin  A1C   (GLYCO) (496) -- ABN required due to diagnosis (CPT: 73710)      626948 -- Iron & TIBC    (IS) (7573) -- ABN required due to diagnosis (CPT: 54627,03500)  Not all tests in this order have been sent for requisitioning  Pending tests not yet sent for requisitioning:      11/07/2008: EMR Link Lab -- B1, Thiamine   (VITMB1)  (5042) [CPT-84425] (signed)      11/07/2008: EMR Link Lab -- B-12  (B12)  (927) [*CPT-82607] (signed)      11/07/2008: EMR Link Lab -- CBC without Diff   (CBC) (1759) [CPT-85027] (signed)      11/07/2008: EMR Link Lab -- Ferritin Level   (FERRIT) (457)  [CPT-82728] (signed)      11/07/2008: EMR Link Lab -- Folic Acid  (FOLATE) (466) [[id number]] (signed)      11/07/2008: EMR Link Lab --  Hemoglobin  A1C   (GLYCO) (496)  [CPT-83036] (signed)      11/07/2008: EMR Link Lab -- Hepatic Function    (LIVP) (10256)  [CPT-80076] (signed)      11/07/2008: EMR Link Lab -- Iron & TIBC    (IS) (7573) [CPT-83540] (signed)      11/07/2008: EMR Link Lab -- Lipid Profile   (FATS) (7600) [CPT-80061] (signed)      11/07/2008: EMR Link Lab -- Magnesium Level  (MG) (622)  [CPT-83735] (signed)      11/07/2008: EMR Link Lab -- Renal Function Panel   (KIDNEY) (10314) [CPT-80069] (signed)      11/07/2008: EMR Link Lab -- Vitamin D, 25-Hydrodroxy  ([id number]) (17306) [CPT-82306] (signed)

## 2008-11-10 NOTE — Unmapped (Signed)
Signed by Eda Keys MD on 11/10/2008 at 17:21:43    [facility]  CARDIOLOGY FOLLOW-UP VISIT    History of Present Illness   [name] is a 68 Years Old Male who was seen at the request of L. Neack, MD.  I had the pleasure of seeing [name] in our office in follow-up to his hypertension and atrial fibrillation, status-post surgical mini-Maze procedure.  [name] is doing well, and denied any cardiopulmonary complaints.       Previous Echocardiogram:  2.22.10 echo  Study Conclusions     - Left ventricle: The cavity size was normal. Wall thickness was     normal. Systolic function was normal. The estimated ejection     fraction was in the range of 60% to 65%. Wall motion was normal;     there were no regional wall motion abnormalities. Left     ventricular diastolic function parameters were normal.   - Mitral valve: Mild regurgitation.   - Left atrium: The atrium was mildly dilated.   - Pulmonic valve: Peak gradient: 3mm Hg (S).    Previous Nuclear Study:  2.22.10 stress  Study Conclusions     - Stress ECG conclusions: There were no stress arrhythmias or     conduction abnormalities. The stress ECG was negative for     ischemia to submaximal level of exercise - inconclusive.   - Stress echo: There was no echocardiographic evidence for     stress-induced ischemia.   Impressions: Nondiagnostic for ischemia, due to failure to reach   target heart rate. Patient was asymptomatic, without any associated   ECG or echocardiographic changes, to achieved level of exercise   (73% of target heartrate).   Normal stress test to submaximal  level of exercise - inconclusive.   STRESS ECHO REPORT IN VERICIS AND EKG STRIPS IN MUSE   Confirmed by SUPERVISING, CARDIOLOGIST ([id number]), editor WESTERMEYER, MARILYN ([id number]) on 06/02/2008 2:45:21 PM      Previous EKG/Holter/Event Monitor:  2.22.10 ekg  Ventricular Rate  63 BPM   Atrial Rate  63 BPM   P-R Interval  208 ms   QRS Duration  106 ms   QT  468 ms   QTc  478 ms   P  Axis  -21 degrees   R Axis  -7 degrees   T Axis  68 degrees     NORMAL SINUS RHYTHM   MINIMAL VOLTAGE CRITERIA FOR LVH, MAY BE NORMAL VARIANT   NONSPECIFIC T WAVE CHANGE   NONSPECIFIC INTRAVENTRICULAR CONDUCTION DELAY   LEFT AXIS DEVIATION, LEFT ANTERIOR HEMIBLOCK   ABNORMAL ECG    Previous [address] Cardiac CT/MRI:  cardiac ct 9.24.09  MPRESSION:    Agatston calcium score of 87. No evidence of pulmonary vein stenosis.Standard pulmonary venous anatomy.    Tiny orificial branches in the right inferior pulmonary vein.    No evidence of a left atrial appendage thrombus. The left atrial appendage travels superior and anterior to rest lateral to the pulmonary outflow tract. Please note that the first obtuse marginal branch is immediately anterior to the base of the left atrial appendage.      Standard coronary anatomy with right coronary dominance.    Minimal calcified and noncalcified plaque in coronary circulation with no evidence of significant stenosis. Please note that there is calcified plaque at the origin of D1. It is difficult to assess the marked narrowing caused that this plaque as the D1 branch is small. Mild  narrowing at the origin of D2 caused by calcified plaque.    Groundglass opacity along the right major fissure. Consider dedicated chest CT in 12 months to ensure stability.    Hepatic cyst.    Dilation and mild wall thickening of the esophagus throughout its course. Although this could be related to nondistention consider further evaluation with upper GI or endoscopy. **** end of result ****    Previous Radiology:  3.19.10 ct chest  IMPRESSION:   Interval postsurgical changes noted in the chest wall bilaterally with   focal associated areas of pleural thickening in locations of prior   bilateral lung herniations. No lung herniation is seen currently.      Report revised on 06/27/2008 13:12 EDT by Sherolyn Buba M.D., Marya Landry     ********** VERIFIED REPORT **********     Dictated: 06/27/2008 1:03 pm       Nogueira M.D., Marya Landry   Signed (Electronic Signature):  Nogueira M.D., Maisie Fus E         03/19/1    ct chest  2.22.10  IMPRESSION:     Bibasilar strandy density likely representing atelectatic changes or   scarring.    mri abdomen 3.10.09  mpression:    1. Multiple hepatic cysts. 2. Irregular area of somewhat linear hyperintense T2 signal in the uncinate process, possibly representing a dilated sidebranch, could represent a small intraductal papillary mucinous neoplasm. Correlation with endoscopic ultrasound is recommended.**** end of result ****      Previous Other:  3.5.09 ct chest  mpression:    1. Minimal patchy areas of dependent lower lobe groundglass opacity These areas could represent dependent atelectasis, however could also represent mild interstitial lung disease. This could be further evaluated followup prone HRCT to determine whether this persists or resolves with prone positioning.    2. Mild areas of air trapping due to small airway disease or reactive disease.    3. Three low density lesions within the liver, the largest measuring fluid density, which are incompletely characterized on the current study. **** end of result ***    PAST HISTORY  Past Medical History (reviewed - no changes required):  Atrial Fibrillation, Hyperlipidemia, Hypertension, Dilated Cardiomyopathy, Diabetes Type II, Hypothyroidism, sleep apnea  Surgical History (reviewed - no changes required):  Septoplasty: ., UPPP, Hernia Surgery: right inguinal, mini maze 9-09,  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  sleep apnea surgery, has had esophageal dilations in the past (Dr. Karolee Stamps), No problems with anesthesia, No problems with healing, No problems with blood clots after surgery    Family History (reviewed - no changes required): Mother - dm2, htn  Sister - dm2, cad  Brother - dm2, cad, htn  Social History (reviewed - no changes required): Marital Status: married,   Employment Status: retired,   Patient Lives at: home,    Support System: excellent  Exercise: not due to foot,   Caffeine per Day: 0  Seatbelt Use: 100 % of the time  Drug Use: none  Tobacco Usage:non-smoker    Coronary Risk Factor Assessment   Cigarette Smoker: non-smoker     PT: 25.3 (12/05/2007 9:06:00 AM)  INR: 2.6 (12/05/2007 9:06:00 AM)  HgbA1C 7.2 (04/15/2008 2:15:00 PM)  Serum Glucose: 147 (04/15/2008 2:15:00 PM)  Total Protein: 6.8 (04/15/2008 2:15:00 PM)  Albumin: 4.4 (04/15/2008 2:15:00 PM)  AST: 15 (04/15/2008 2:15:00 PM)  ALT: 18 (04/15/2008 2:15:00 PM)  Alkaline Phosphatase: 83 (04/15/2008 2:15:00 PM)  Total Bilirubin: 0.5 (04/15/2008 2:15:00 PM)  Serum Cholesterol 163 (  04/15/2008 2:15:00 PM)  LDL: 95 (04/15/2008 2:15:00 PM)  HDL: 31 (04/15/2008 2:15:00 PM)  Triglyceride: 186 (04/15/2008 2:15:00 PM)  Sodium: 140 (04/15/2008 2:15:00 PM)  Potassium: 4.3 (04/15/2008 2:15:00 PM)  Chloride: 102 (04/15/2008 2:15:00 PM)  BUN: 26 (04/15/2008 2:15:00 PM)  Creatinine: 1.3 (04/15/2008 2:15:00 PM)  Serum Calcium: 9.0 (04/15/2008 2:15:00 PM)  Serum Phospate: 3.2 (10/08/2007 2:48:00 PM)  Microalbumin/Cr: 0.9 (04/15/2008 2:15:00 PM)  PSA: 0.6 (04/15/2008 2:15:00 PM)    Review of Systems  See scanned document dated November 10, 2008.  The Review of Systems has been reviewed and the patient advised to follow up with PCP for non-cardiac problems.      Medications   PRAVACHOL 40 MG TABS (PRAVASTATIN SODIUM) one by mouth every evening  DIOVAN HCT 320-25 MG TABS (VALSARTAN-HYDROCHLOROTHIAZIDE) one by mouth daily  COREG 25 MG TABS (CARVEDILOL) one by mouth twice daily  LEVOTHYROXINE SODIUM 100 MCG TABS (LEVOTHYROXINE SODIUM) 1 by mouth daily  SAW PALMETTO EXTR (SAW PALMETTO) 1 by mouth daily  GLUCOSAMINE SULFATE POWD (GLUCOSAMINE SULFATE)   VIAGRA 100 MG TABS (SILDENAFIL CITRATE) one-half of a pill daily prn  GLUCOVANCE 2.5-500 MG TABS (GLYBURIDE-METFORMIN) one by mouth twice a day  OMEPRAZOLE 40 MG CPDR (OMEPRAZOLE) one by mouth daily  ANACIN 81 MG TBEC (ASPIRIN)      Allergies  No Known Allergies    Vital Signs   Height: 72 in.  Weight: 266 lbs.   Weight change of 0.50 lbs since previous visit.    BMI (in-lb): 36.21  BSA (m2): 2.41  Pulse rate: 76   BP #1: 136 / 74 mm Hg  Cuff Size: Std   O2 Saturation: 96% room air at rest   Intake recorded by: Kerrie Pleasure MA  November 10, 2008 1:33 PM  Smoking Status: non-smoker      PHYSICAL EXAMINATION  Constitutional: alert, no acute distress, well hydrated, well developed, well nourished, appropriate for age, obese appearing.   Skin: normal color, no rashes, no unusual bruising, warm to touch. well-healed cicatrices in his chest  Head: atraumatic, normocephalic.   Eyes: pupils equal, no injection, no icterus.   Ears/Nose/Throat: external ears normal, external nose normal, hearing normal.   Neck: no carotid bruits, no thyromegaly.   Chest: no deformities, no apparent respiratory distress, normal percussion, no chest wall tenderness, clear to auscultation, normal breath sounds.   Neck Veins: not distended.   Carotids: normal upstroke, no bruits.   Palpation: normal.   S1 S2: normal S1 S2.   Extra Sounds: no gallop or rub or click.   Rhythm: regular rate and rhythm.   Ectopy: without ectopy.   Bilateral Lower Extremity Edema: none.   Murmur: none.   Radial - Right: normal.   Radial - Left: normal.   Abdomen: nondistended, nontender, normal BS, no organomegaly, no prominent aortic pulsation.   Spine: normal mobility, no deformities.   Extremities: full joint motion, no deformities.   Neuro: cranial nerves grossly intact, non focal.   Psych: affect and mood appropriate, normal interaction.     EKG Interpretation:   Date: 11/10/2008  No EKG done at this visit.      Assessment and Plan     Assessment   1. Paroxysmal atrial fibrillation - No symptomatic recurrence.  Continue present medications.    2. Chest pain - Due to focal lung herniation - status-post repair by Dr. Artis Flock.    3. Hypertension - Well controlled on present medications - continue  same, in  addition to a salt restricted diet.    4. Hyperlipidemia - Continue present medications, in addition to a low-fat, cholesterol-free diet, and exercises.    5. Exertional dyspnea, upon lifting heavy weights - Suspect due to physical deconditioning.  Counselled on therapeutic lifestyle modification, comprising of dietary restriction and exercises, with a goal towards weight reduction.    Thank you for providing [place] the opportunity to participate in [name] care.  PLease do not hesitate to contact me if I may be of any further assistance.      DISPOSITION:    Return to clinic in 1 year(s)      Counseling:   Atrial Fibrillation  Diet  Exercise  Weight Management    CC:    [name], MD  L. Newman Pies, MD                    ]

## 2008-11-10 NOTE — Unmapped (Signed)
Signed by Eda Keys MD on 11/10/2008 at 00:00:00  ROS      Imported By: Betsey Amen 11/20/2008 11:25:42    _____________________________________________________________________    External Attachment:    Please see [facility] EMR for this document.

## 2008-11-17 NOTE — Unmapped (Signed)
Signed by Kerrie Pleasure MA on 11/17/2008 at [facility]                        [facility]         Cardiology         [address], [address]         [place], [place] [address]         p [phone] f [phone]         [email or web address]          November 17, 2008              RE: CELESTINE BOUGIE  DOB:   03-05-1941        Dear L. Newman Pies, MD,    I had the pleasure of seeing INIOLUWA BARIS in the office today for a cardiology follow-up. Enclosed please find my office note and recommendations.  Should you have any further questions please do not hesitate to contact me.    I would like to thank you for allowing me to participate in this patient's care.    Best personal regards.

## 2008-11-24 NOTE — Unmapped (Signed)
Signed by Aurelio Jew on 11/24/2008 at 15:33:09                   [facility]         General Surgery         [address]         [place], [place] [address]         p [phone] f [phone]         [email or web address]            November 07, 2008              RE: [name]  DOB:   [birth year]      Dear [name], MD ,    I had the pleasure of seeing your patient, [name] in the office today for follow-up care. Enclosed is a copy of my office note and recommendations regarding this patient.    Thank you for the opportunity to participate in [name] care and please do not hesitate to contact me with any questions.    Sincerely,      Dimas Chyle, MD  Assistant Professor of Surgery  GI/Endocrine Surgery          cc:    Dr. Elson Clan

## 2008-11-28 NOTE — Unmapped (Signed)
Signed by Cherly Hensen MA on 11/28/2008 at 09:26:55    Pulmonary - PRELOAD      Preload Clinical Lists   Problems:   SLEEP APNEA (ICD-780.57)  MORBID OBESITY (ICD-278.01)  CONTACT DERMATITIS&OTHER ECZEMA DUE UNSPEC CAUSE (ICD-692.9)  CHEST PAIN UNSPECIFIED (ICD-786.50)  ? 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.8)  HEMORRHOIDS (ICD-455.6)  INSECT BITE (ICD-919.4)  HYPOTHYROIDISM NOS (ICD-244.9)  DIABETES MELLITUS (ICD-250.00)  SCREENING FOR MALIGNANT NEOPLASM, PROSTATE (ICD-V76.44)  HYPERTENSION (ICD-401.9)  HYPERLIPIDEMIA (ICD-272.4)  ATRIAL FIBRILLATION (ICD-427.31)  CHF (ICD-428.0)    Medications:   PRAVACHOL 40 MG TABS (PRAVASTATIN SODIUM) one by mouth every evening  DIOVAN HCT 320-25 MG TABS (VALSARTAN-HYDROCHLOROTHIAZIDE) one by mouth daily  COREG 25 MG TABS (CARVEDILOL) one by mouth twice daily  LEVOTHYROXINE SODIUM 100 MCG TABS (LEVOTHYROXINE SODIUM) 1 by mouth daily  SAW PALMETTO EXTR (SAW PALMETTO) 1 by mouth daily  GLUCOSAMINE SULFATE POWD (GLUCOSAMINE SULFATE)   VIAGRA 100 MG TABS (SILDENAFIL CITRATE) one-half of a pill daily prn  GLUCOVANCE 2.5-500 MG TABS (GLYBURIDE-METFORMIN) one by mouth twice a day  OMEPRAZOLE 40 MG CPDR (OMEPRAZOLE) one by mouth daily  ANACIN 81 MG TBEC (ASPIRIN)   OSTEO BI-FLEX JOINT SHIELD  TABS (MISC NATURAL PRODUCTS)       Allergies:  No Known Allergies    Primary Care Provider: [name], MD  Other Physician:   Dr. Eda Keys - cardiologist      NPSG:     Date of NPSG:   05/27/2003  SE %:   63.6  AHI:   ? /hr  Lowest p ox %:   73  NPSG performed at outlying sleep center:   [facility]    CPAP:     CmH2O:   ?  DME Company:   ?    PAST HISTORY  Family History: Mother - Deceased - dm2, htn  Father - Deceased  Daughter - 0  Son - 0  Sister - dm2, cad - 2 sisters (1 is deceased)  Brother - dm2, cad, htn - 2 brothers  Social History: Preferred Language: English,   Marital Status:  married,   Employment Status: retired,   Patient Lives at: home,   Support System: excellent  Exercise: not due to foot,   Caffeine per Day: 0  Seatbelt Use: 100 % of the time  Alcohol Use: none  Drug Use: none  Tobacco Usage:non-smoker            ]

## 2008-11-28 NOTE — Unmapped (Signed)
Signed by Hubbard Hartshorn MD on 11/28/2008 at 11:00:23    Sleep Medicine New Patient Visit    Assessment and Plan   68 yo man with a history of severe OSA status post multiple upper airway surgeries and currently on CPAP with a significantly elevated ESS.  Patient already has several established cardiovascular diseases and is undergoing evaluation for a bariatric procedure both compelling reasons to ensure OSA is optimally treated.  He has an increased likelihood of depression based on his reported symptoms of intermittent depressed mood and anxiety.  He describes muscle twitches and leg movements at night while sleeping, but no abnormal leg movements were noted on previous polysomnographies.  His caffeine use may also be leading to some fragmentation of sleep.    I ordered a CPAP titration study given his elevated ESS, the length of time since his previous study and his change in weight.    I encouraged him to go to his mental health provider appointment for evaluation of depression.    We discussed limiting caffeine intake to no more than 2 beverages per day, and restricting use to earlier than 2 pm.  Assessment    Problem List Updates for today's visit   1. Assessed SLEEP APNEA (ICD-780.57) as comment only  2. Assessed ? of GERD (ICD-530.81) as improved    Plan    Orders for today's visit  1. Ordered 99244 - Ofc Consult, Level IV [CPT-99244]  2. Ordered CPAP Calibration Order [CPT-95811]        Disposition:   Return to clinic for Doctor Visit in 6 week(s)   Appointment Reason: follow up CPAP titration    CC:        History of Present Illness   Chief Complaint and HPI:   osa    The patient is a 68 yo man first diagnosed with OSA in 1994, and has used CPAP off and on since that time.  CPAP set at approximately 9 or 10 cm H2O.  Using full face mask although previously tried a nasal mask which wasn't efficacious because he mouth breathed despite use of a chin strap.  He underwent several upper airway surgeries (nasal  septoplasty, UPPP?).    Pt reports using CPAP 7 nights/week for approx 6 hours/night.  Pt sleeps alone.  Pt doesn't know if he snores while the mask is on.  He doesn't recall when his last CPAP titration study was performed, but it has been several years.    Pt is now undergoing evaluation for a bariatric procedure (lap band?) and is sent for evaluation of CPAP efficacy.    BT 12 mn.  SL 20 mins.  Awakenings 2-3/night with awakenings lasting up to 15 mins.  Awakenings attributed to using bathroom, or needing to let dog out.  RT 7 am.  Feels refreshed in the morning.  Takes naps in the afternoon typically 3-4 days of the week.  Naps typically last 1 hour and occur at about 4 pm.  The following information was obtained on the attached Outpatient Sleep Health Questionnaire and was reviewed and discussed with the patient; amendments and comments to the patient's responses have been added to the questionnaire which is to be included in the permanent record.  See scanned document.               - Past medical history            - Allergies            -  Review of systems            - Past surgical history            - Family history            - Medications            - Social history  Habitual bedtime:   Midnight  Habitual awakening time:   5-6 am  Degree of Sleep Problem:   Severe  Snoring:   Yes  Symptoms suggetive of cataplexy:   No  Hypnagogic hallucinations:   No  Hypnopompic hallucinations:   No  Sleep paralysis:   No     - Had previously while he was working, but not in years  Sleep onset insomnia:   No  Sleep maintenance insomnia:   Yes  Waking up refreshed:   No  Waking up with headaches:   Yes     - When he doesn't use CPAP  Waking up with dry mouth:   Yes  Description of Sleep Problem:   Wake up during the night, Wake up early in the morning, Excessive daytime sleepiness  Family members with sleep problems:   brothers  Treatments Received:   CPAP, surgery    NPSG:     Date of NPSG:   06/11/2003  SE %:    63.6  AHI:   ?  Lowest p ox %:   73  NPSG performed at outlying sleep center:   [facility]    CPAP:     Date of CPAP: 06/11/2003  SE %:   62.5  AHI:   ?  Lowest p ox %:   86  NPSG performed at outlying sleep center:   [facility]  CmH2O:   9 cm per pt  DME Company:   [facility]    PAST HISTORY  Past Medical History (reviewed - no changes required):  Atrial Fibrillation, Hyperlipidemia, Hypertension, Dilated Cardiomyopathy, Diabetes Type II, Hypothyroidism, sleep apnea  Surgical History (reviewed - no changes required):  Septoplasty: ., UPPP, Hernia Surgery: right inguinal, mini maze 9-09,  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  sleep apnea surgery, has had esophageal dilations in the past (Dr. Karolee Stamps), No problems with anesthesia, No problems with healing, No problems with blood clots after surgery    Family History (reviewed - no changes required): Mother - Deceased - dm2, htn  Father - Deceased  Daughter - 0  Son - 0  Sister - dm2, cad - 2 sisters (1 is deceased)  Brother - dm2, cad, htn - 2 brothers  Social History (reviewed - no changes required): Preferred Language: English,   Marital Status: married,   Employment Status: retired,   Patient Lives at: home,   Support System: excellent  Exercise: not due to foot,   Caffeine per Day: 2  Seatbelt Use: 100 % of the time  Alcohol Use: none  Drug Use: none  Tobacco Usage:non-smoker    Review of Systems    See scanned review of systems.    Intake Comments:   Ref Phys:  Dr. Arther Abbott  Pt has been using CPAP for past 15 yrs until about 2 months ago.  Pt states he has been using cpap for past 3 weeks.  Some nights will use all night; but will wake up with h/a when bothers his sinus. Pt now not using humidfier.   States he has had problems with sinus and lungs; Pt is waking up  during the night and early in the morning; +EDS & snoring    VITAL SIGNS    Height: 72 inches    Weight: 263.8 lbs/ 0 kg.,    Blood Pressure: 124  / 74mm Hg,    Pulse rate: 63,    Respirations: 16,SaO2:   98 on   room air  Pneumovax:     PNEUMOVAX (05/31/2002 2:13:39 PM)  Flu Vaccine:     done at [facility] (01/20/2007 11:47:33 AM)    Intake recorded by:  Cherly Hensen MA  November 28, 2008 9:32 AM    Smoking Status: non-smoker    Physical Examination  Constitutional:  well nourished, well groomed, no respiratory distress, not using expiratory accessory muscles, obese.  Skin:  normal color, warm and dry, lesions, erythematous patches on upper extrems.  Eyes:  sclera anicteric.  Ears/Nose/Throat:  hearing grossly normal, left nasal septal deviation, uvula not visualized, status post uvulectomy  mild tonsilar pallor  L nare more narrow than R.  Neck:  supple, no tracheal shift.  Chest:  no deformities, normal resonance, no wheezing, no crackles, no rhonchi.  Cardiovascular:  pulses 2+ and symmetric, no murmur, no rub, regular rate and rhythm, peripheral edema.  Abdomen:  soft, nontender.  Spine:  no deformities.  Extremities:  no clubbing, no cyanosis, normal gait.  Neurological:  cranial nerves grossly intact.  Psychological:  oriented to time; place; and person, normal interaction.    EPWORTH SLEEPINESS SCALE   1. How likely are you to doze off or fall asleep while sitting and reading?        - patient indicates a score of:  3  2. How likely are you to doze off or fall asleep while watching television?        - patient indicates a score of:  3  3. How likely are you to doze off or fall asleep while in a theater or meeting?        - patient indicates a score of:  3  4. How likely are you to doze off or fall asleep while traveling as a passenger?        - patient indicates a score of:  3  5. How likely are you to doze off or fall asleep while resting in the afternoon?        - patient indicates a score of:  3  6. How likely are you to doze off or fall asleep while sitting & talking to someone?        - patient indicates a score of:  0  7. How likely are you to doze  off or fall asleep while sitting quietly after a meal?        - patient indicates a score of:  3  8. How likely are you to doze off or fall asleep while sitting in a car stopped in traffic?        - patient indicates a score of:  0                                                            Total Score: 18      Prior Diagnostic Testing:

## 2008-11-28 NOTE — Unmapped (Signed)
Signed by Hubbard Hartshorn MD on 11/28/2008 at 11:06:53                       [facility]        [facility]        [address]        [place], [place] [address]        p [phone] f [phone]        [email or web address]        [email or web address]      November 28, 2008        RE: [name]  DOB:   [birth year]        Dear Dr. Rada Hay,    I had the pleasure of seeing [name] at your request for consultation regarding obstructive sleep apnea. My detailed assessment and plan is described below. Should you have any further questions please do not hesitate to contact me at [phone].    IMPRESSION & PLAN:    68 yo man with a history of severe OSA based on multiple previous diagnostic polysomnographies and  status post multiple upper airway surgeries, currently on CPAP with a significantly elevated ESS.  Patient already has several established cardiovascular diseases and is undergoing evaluation for a bariatric procedure, both compelling reasons to ensure OSA is optimally treated.  He has an increased likelihood of depression based on his reported symptoms of intermittent depressed mood and anxiety.  He describes muscle twitches and leg movements at night while sleeping, but no abnormal leg movements were noted on previous polysomnographies.  His caffeine use may also be leading to some fragmentation of sleep.    I ordered a CPAP titration study given his elevated ESS, the length of time since his previous study and his change in weight.    I encouraged him to go to his mental health provider appointment for evaluation of depression.    We discussed limiting caffeine intake to no more than 2 beverages per day, and restricting use to earlier than 2 pm.       Sincerely,          Janese Banks, MD  Assistant Professor Pulmonary, Critical Care and Sleep Medicine

## 2008-11-28 NOTE — Unmapped (Signed)
Signed by Dimas Chyle MD on 11/28/2008 at 00:00:00  Letter      Imported By: Betsey Amen 12/24/2008 12:00:06    _____________________________________________________________________    External Attachment:    Please see [facility] EMR for this document.

## 2008-12-03 NOTE — Unmapped (Signed)
Signed by Doren Custard on 12/12/2008 at 16:52:24    Called and spoke with wife to have pt call to schedule 6 week foll up with Dr. Maia Plan.  ..................................................................Marland KitchenDoren Custard  December 03, 2008 12:07 PM    pt returned call was read results and told him we will contact him regarding scheduling 6 week foll up due to medicare.  ..................................................................Marland KitchenDoren Custard  December 12, 2008 4:52 PM

## 2008-12-05 ENCOUNTER — Inpatient Hospital Stay

## 2008-12-05 NOTE — Unmapped (Signed)
Signed by Hubbard Hartshorn MD on 12/05/2008 at 00:00:00  Sleep Medicine      Imported By: Betsey Amen 12/29/2008 12:12:02    _____________________________________________________________________    External Attachment:    Please see [facility] EMR for this document.

## 2008-12-05 NOTE — Unmapped (Signed)
Signed by Hubbard Hartshorn MD on 12/05/2008 at 00:00:00  Montage Sheet      Imported By: Betsey Amen 01/02/2009 10:13:13    _____________________________________________________________________    External Attachment:    Please see [facility] EMR for this document.

## 2008-12-05 NOTE — Unmapped (Signed)
Signed by Hubbard Hartshorn MD on 12/05/2008 at 00:00:00  Sleep Questionnaires      Imported By: Maryellen Pile 01/02/2009 13:45:36    _____________________________________________________________________    External Attachment:    Please see [facility] EMR for this document.

## 2008-12-05 NOTE — Unmapped (Signed)
Signed by Hubbard Hartshorn MD on 12/05/2008 at 00:00:00  Summary Sheet      Imported By: Betsey Amen 01/02/2009 10:12:56    _____________________________________________________________________    External Attachment:    Please see [facility] EMR for this document.

## 2008-12-06 NOTE — Unmapped (Signed)
Signed by Georgena Spurling MD on 12/06/2008 at 00:00:00  Complete 2      Imported By: Coletta Memos 12/11/2008 21:40:50    _____________________________________________________________________    External Attachment:    Please see [facility] EMR for this document.

## 2008-12-06 NOTE — Unmapped (Signed)
Signed by Domingo Sep on 12/11/2008 at 15:49:43      NOCTURNAL POLYSOMNOGRAPHY REPORT WITH CPAP CALIBRATION     Patient Name :   [name], [name] Project :   Ananda, [facility] CPAP   Hospital # :  [id number] Subject Code :  971/10   Study Date :   Aug. 28, 2010 Referring Physician :   Arther Abbott   Sex :   D.O.B. :    Male  Mar 17, 1941 Sleep Specialist :   Dawanda Mapel     This is a nocturnal polysomnogram performed on the evening of Aug-28-2010, on this 68 year-old male with a height of 72.0 inches and a weight of 265.0 pounds, for a BMI of 35.9 kg/m2 which is within obesity range. This study was performed with positive airway pressure calibration as a follow-up from an initial polysomnography, which revealed sleep-related breathing disorder, in this patient with a history of atrial fibrillation, septoplasty, UPPP, dyslipidemia, HTN, hypothyroidism, OSA, dilated cardiomyopathy and esophageal strictures s/p dilation. This study is part of preoperative clinical optimization in preparation for bariatric surgery. This patient reportedly uses CPAP at 9 cm H2O at home.    This study was performed on a [employer]?? Elite sleep system, monitoring right and left oculograms, C4-A1, C3-A2, O1-A2, O2-A1, thoracic and abdominal respiratory effort channels via piezoelectric method, EKG, pulse rate, arterial oxyhemoglobin saturation level via pulse oximetry, left and right anterior tibialis EMG, chin EMG, body position, video channel, positive airway pressure level and flow and snoring microphone.    The raw data was visually analyzed and scored according to AASM accepted criteria. Apnea is defined as cessation of air flow for at least 10 seconds; hypopnea is defined as at least a 30% reduction in airflow, accompanied by at least a 4% decrease in oxyhemoglobin saturation.    Sleep Parameters:  Review of the electroencephalographic tracings revealed that this patient was studied for 328.1 minutes, of which 313.1 minutes were  spent during sleep, for a sleep efficiency of 76.5%. The sleep onset latency was 12.5 minutes.    The patient reached 3 REM sleep cycles during this study for REM sleep latency from sleep onset of 77.1 minutes.    Of note, this study was technically suboptimal, as it failed to record REM sleep in supine position.    Of the total sleep time, the patient spent 28.7% in sleep stage 1, 47.7% in sleep stage 2, 22.0% in slow wave sleep (stages 3 and 4 combined), and 27.2% in sleep stage REM.    There were no clinically significant periodic limb movements recorded during this study.    There were 13 spontaneous arousals recorded during this study, for a spontaneous arousal index of 3.9 events per hours.    There were 48 respiratory-effort related arousals recorded during this study for a RERAI of 14.5 events per hour.    Cardiorespiratory Parameters:  Review of the respiratory tracings revealed that this patient had 22 obstructive hypopneas recorded during this study, for an apnea-hypopnea index (AHI) 7.4 of events per hour. The REM sleep event index was 3.3 events per hour, and the NREM sleep event index was 8.6 events per hour.    Review of the oxyhemoglobin saturation tracings revealed that this patient reached a minimum level of 90.0% in REM sleep and 90.0% in NREM sleep.     This patient was studied with the addition of nocturnal CPAP. The pressure was delivered via a Quattro full face interface, size large. Heated humidification and  C-Flex were used to improve the patient???s tolerance of the nasal mask. The pressure was calibrated from an initial level of 9 cm H2O to a final level of 13 cm H2O. At the optimal level of pressure achieved, 13 cm H2O, this patient was studied for 62.7 minutes of which 32.5 minutes of REM sleep and 22.5 minutes of NREM sleep were recorded. The apnea-hypopnea index (AHI) at this level of pressure was 1.1 events per hour. The minimum oxyhemoglobin saturation at this level of pressure was  90.0%.    Review of the heart rate tracings revealed that this patient spent the recorded sleep time with heart rates between 52 and 69 bpm. The rhythm seems to have been sinus all throughout the night with isolated PVCs.      Impressions & Recommendations:      This study demonstrates good control of this patient???s previously diagnosed sleep related breathing disorder while wearing a Quattro full face interface, size large at the optimal pressure of 13 cm H2O. Heated humidifier and C-Flex set for patient???s comfort are recommended in order to improve this patient???s tolerance and compliance with the nasal mask. Compliance with this regimen is strongly encouraged. Close cooperation with the home equipment company, as well as close clinical follow-up in an effort to address possible nasal or facial side effects, are all recommended. Patient education on the cardiovascular consequences of obstructive sleep apnea is important. Educating the patient against drowsy driving, pending successful therapy of his sleep disordered breathing is also indicated. Weight loss is recommended and should be pursued. Follow-up polysomnography within three to six months, should this patient???s symptoms recur or fail to completely resolve, is also indicated.    These findings will be discussed with the patient during the follow up office visit with Dr. Maia Plan, for whom I am covering.    These findings will also be forwarded to the treating physicians, Drs. Rada Hay, [name] and Eda Keys; Dr. Arther Abbott is thanked for this referral.        Georgena Spurling, MD, [place], D-ABSM  Director,   [facility]  Associate Professor of Medicine  Division of Pulmonary, Critical Care and Sleep Medicine  [facility]

## 2008-12-07 NOTE — Unmapped (Signed)
Signed by Emelda Fear RPSGT on 12/07/2008 at 12:01:39    This patient, per their request, was set up with CPAP the morning following the CPAP calibration.The homecare company was [employer].  The CPAP was set at 13 cm H2O with a [employer] Quattro  large interface and heated a humidifier.The patient was instructed on Obstructive Sleep Apnea and CPAP therapy.The patient was instructed on the need to use PAP therapy every night and during naps.  Patient voiced understanding.Patient was also instructed on the use of the ramp and the heated humidifier in detail.Patient again voiced understanding.patient demonstrated putting the nasal interface on and off.  The patient also demonstrated the ability to turn the machine on and off and to remove the humidifier chamber.  Cleaning instructions were covered in detail.  The patient voiced understanding.  The patient was given written cleaning instructions to follow, and was also given written contact information for the homecare company.     ..................................................................Marland KitchenAmy L Hess RPSGT  December 07, 2008 12:01 PM

## 2008-12-10 NOTE — Unmapped (Signed)
Signed by Hubbard Hartshorn MD on 12/10/2008 at 00:00:00  Sleep Medicine      Imported By: Betsey Amen 12/29/2008 12:22:03    _____________________________________________________________________    External Attachment:    Please see [facility] EMR for this document.

## 2008-12-11 NOTE — Unmapped (Signed)
Signed by Doren Custard on 12/19/2008 at 15:36:07    routed cpap results to Dr. Arther Abbott, Dr. Newman Pies and Dr. Elson Clan. pt is not sch for follow up ov with Dr. Maia Plan, flagged him to see when he needs to see pt after CPAP, will call pt after he notifies [place] to give pt results and sch f/up ov.  ..................................................................Marland KitchenJanan Halter Schlensker  December 11, 2008 3:56 PM    l/m for pt to give results and sch 6 week follow up with Dr. Maia Plan.  ..................................................................Marland KitchenJanan Halter Schlensker  December 12, 2008 3:40 PM    pt given results but we still will need to call for 6 week foll up with Dr. Maia Plan.  ..................................................................Marland KitchenDoren Custard  December 12, 2008 4:53 PM    Mailed ltr to have pt call and schedule clinic foll up appt.  ..................................................................Marland KitchenDoren Custard  December 17, 2008 1:00 PM    pt called [place] back and we told him we would have to call him next week due to not having Dr. Lauraine Rinne schedule available.  ..................................................................Marland KitchenDoren Custard  December 19, 2008 3:35 PM

## 2008-12-25 NOTE — Unmapped (Signed)
Signed by Cherly Hensen MA on 12/25/2008 at 13:28:29    Clinical Lists Changes    Orders:  Added new Referral order of Treatment Orders ([id number]) - Signed    Faxed tx order, demos, npsg ([facility]) and cpap to [facility];  Pt was set up by Amy Hess.  ..................................................................Marland KitchenCherly Hensen MA  December 25, 2008 1:27 PM

## 2008-12-29 NOTE — Unmapped (Signed)
Signed by Lenn Sink on 01/06/2009 at 10:04:35    pt needs foll up appt with Dr. Maia Plan mid October, pt has medicare primary and will need 31-90 foll up from cpap. Left message with wife on 12/03/08 and mailed ltr to pt on 12/17/08 to contact sleep center to schedule appt.  ..................................................................Marland KitchenDoren Custard  December 29, 2008 12:50 PM

## 2008-12-29 NOTE — Unmapped (Signed)
Signed by Hubbard Hartshorn MD on 12/29/2008 at 00:00:00  Sleep Medicine Records      Imported By: Betsey Amen 12/29/2008 12:24:27    _____________________________________________________________________    External Attachment:    Please see [facility] EMR for this document.

## 2009-01-18 NOTE — Unmapped (Signed)
Signed by Pilar Jarvis PA on 01/18/2009 at 00:00:00  Sleep Medicine      Imported By: Coletta Memos 01/22/2009 17:20:08    _____________________________________________________________________    External Attachment:    Please see [facility] EMR for this document.

## 2009-01-20 NOTE — Unmapped (Addendum)
Signed by Pilar Jarvis PA on 01/20/2009 at 11:46:58    Sleep Medicine Established Patient Visit    Assessment and Plan   1) OSAHS: recently retitrated with CPAP increased from 9 to 13 cmH2O. EDS decreased from 18 to 12 with new PAP pressure. TST 6 hrs.   Recommend gradually increasing TST to 8-9 hrs. PMH: a. fib resolved w/ surgery, CHF hx, HTN, DM, hyperlipidemia. Drowsy driving precautions.     OV annually.    Assessment    Problem List Updates for today's visit   1. Added OBSTRUCTIVE SLEEP APNEA (ICD-327.23)  2. Assessed OBSTRUCTIVE SLEEP APNEA (ICD-327.23) as improved            Disposition:   Return to clinic for PA Visit in 1 year(s)   Appointment Reason: osahs    CC:      Richard Closs, MD  Dr. Rada Hay  Dr. Eda Keys - cardiologist    History of Present Illness    no verbal complaints  Family members with sleep problems:   none  Consulted with the following for sleep problems/daytime sleepiness: General practitioner.  Treatments Received:   CPAP  Has sleep quality improved?   Yes  Snoring?   No  Do you wake up refreshed?   Yes  Sleep logs reviewed?   No  CPAP equipment cleaning?   Yes  CPAP equipment maintained?   Yes   The importance of cleaning and maintenance of CPAP equipment was reviewed with the patient.    NPSG:     Date of NPSG:   05/27/2003  SE %:   63.6  AHI:   ?  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  sleep apnea surgery, has had esophageal dilations in the past (Dr. Karolee Stamps), No  problems with anesthesia, No problems with healing, No problems with blood clots after surgery    Family History (reviewed - no changes required): Mother - Deceased - dm2, htn  Father - Deceased  Daughter - 0  Son - 0  Sister - dm2, cad - 2 sisters (1 is deceased)  Brother - dm2, cad, htn - 2 brothers  Social History (reviewed - no changes required): Preferred Language: English,   Marital Status: married,   Employment Status: retired,   Patient Lives at: home,   Support System: excellent  Exercise: not due to foot,   Caffeine per Day: 2  Seatbelt Use: 100 % of the time  Alcohol Use: none  Drug Use: none  Tobacco Usage:non-smoker    Review of Systems    See scanned review of systems.    Intake Comments:   pt is here for osa follow up, no snoring w/mask, uses nightly, c/o mouth gets dry does play w/humidifier, sleeps good, feels rested upon awakening, naps if not doing anything, cleans as directed    VITAL SIGNS    Height: 72 inches  Weight: 266 lbs/ ,    Blood Pressure: 126 / 78mm Hg,    Pulse rate: 68,    Respirations: 14,SaO2:   93 on   room air  Pneumovax:     PNEUMOVAX (05/31/2002 2:13:39 PM)  Flu Vaccine:     done at [facility] (01/20/2007 11:47:33 AM)      Physical Examination  Constitutional:  well nourished, well groomed, no respiratory distress, not using expiratory accessory muscles, speaking in full sentences.  Ears/Nose/Throat:  external nose normal, no nasal polyps, no deviated septum, no sinus tenderness, tongue normal, no thrush, normal uvula, no tonsillar hypertrophy, normal dentition.  Neck:  supple, no thyroid masses or nodules, no thyromegaly, no tracheal shift, JVP-pulsatile.  Chest:  no deformities, no chest wall tenderness, normal chest expansion/symmetry, no wheezing, no crackles, no rhonchi.  Cardiovascular:  pulses 2+ and symmetric, no carotid bruits, no peripheral edema, no murmur, no rub, no gallop, regular rate and rhythm, PMI = 5th LISC MCL.  Neurological:  appropriate mental  status.  Psychological:  oriented to time; place; and person, affect and mood appropriate, normal interaction.    EPWORTH SLEEPINESS SCALE   1. How likely are you to doze off or fall asleep while sitting and reading?        - patient indicates a score of:  2  2. How likely are you to doze off or fall asleep while watching television?        - patient indicates a score of:  3  3. How likely are you to doze off or fall asleep while in a theater or meeting?        - patient indicates a score of:  1  4. How likely are you to doze off or fall asleep while traveling as a passenger?        - patient indicates a score of:  2  5. How likely are you to doze off or fall asleep while resting in the afternoon?        - patient indicates a score of:  3  6. How likely are you to doze off or fall asleep while sitting & talking to someone?        - patient indicates a score of:  0  7. How likely are you to doze off or fall asleep while sitting quietly after a meal?        - patient indicates a score of:  1  8. How likely are you to doze off or fall asleep while sitting in a car stopped in traffic?        - patient indicates a score of:  0                                                            Total Score: 12                    Signed by Pilar Jarvis PA on 01/22/2009 at 10:59:50

## 2009-02-03 MED ORDER — ALBUTEROL SULFATE HFA 108 (90 BASE) MCG/ACT IN AERS
108 (90 Base) MCG/ACT | Freq: Four times a day (QID) | RESPIRATORY_TRACT | Status: AC | PRN
Start: 2009-02-03 — End: 2010-02-03

## 2009-02-03 NOTE — Assessment & Plan Note (Signed)
Recheck CT chest  Pulse ox ok  Prn use of albuterol before walking hills, etc side effects of the medication were discussed  If no help/CT doesn't show a new cause, then pulmonary rehab evaluation

## 2009-02-03 NOTE — Progress Notes (Signed)
Subjective:      Patient ID: Carl Blair is a 68 y.o. male.    HPI    Saw cardiologist, told her -- still has a lot of dyspnea on hills or walking up steps  Gets a cough every 3-4 minutes all the time, mostly at home sitting and watching tv  Feels something, coughs one time, clears it up for a while  No fever/chills  No nocturnal cough  No wheezing noted  CT July discussed  Feels some pain in ruq costal margin, not all the time, is frequent  No pain with deep breath but can feel it with deep breath  Thinks the dyspnea has been like this since the surgery      Review of Systems    Objective:   Physical Exam   Constitutional: He appears well-developed and well-nourished.   HENT:   Head: Normocephalic and atraumatic.   Eyes: Extraocular motions are normal. Pupils are equal, round, and reactive to light.   Neck: Normal range of motion. No JVD present.   Cardiovascular: Normal rate, regular rhythm and normal heart sounds.    Pulmonary/Chest: Effort normal and breath sounds normal. He exhibits tenderness (very mild on right side at costal margin).   Abdominal: Soft. Bowel sounds are normal.   Musculoskeletal: He exhibits no edema.   Skin: Skin is warm and dry.       Assessment:      1. Dyspnea (786.09EC)  PR NONINVASV OXYGEN SATUR;SINGLE, CT CHEST WO CONTRAST   2. Hernia (553.9)  CT CHEST WO CONTRAST             Plan:      See progress note.

## 2009-02-04 ENCOUNTER — Inpatient Hospital Stay

## 2009-02-04 NOTE — Telephone Encounter (Signed)
Patient informed that the CT scan shows no new hernia nor new findings.

## 2009-05-15 MED ORDER — VALSARTAN-HYDROCHLOROTHIAZIDE 320-25 MG PO TABS
320-25 MG | ORAL_TABLET | Freq: Every day | ORAL | Status: DC
Start: 2009-05-15 — End: 2009-08-18

## 2009-05-15 MED ORDER — OMEPRAZOLE 40 MG PO CPDR
40 MG | ORAL_CAPSULE | Freq: Every day | ORAL | Status: DC
Start: 2009-05-15 — End: 2009-08-18

## 2009-05-15 MED ORDER — LEVOTHYROXINE SODIUM 100 MCG PO TABS
100 MCG | ORAL_TABLET | Freq: Every day | ORAL | Status: DC
Start: 2009-05-15 — End: 2009-08-18

## 2009-05-15 MED ORDER — GLYBURIDE-METFORMIN 2.5-500 MG PO TABS
ORAL_TABLET | Freq: Two times a day (BID) | ORAL | Status: DC
Start: 2009-05-15 — End: 2009-08-18

## 2009-05-15 NOTE — Telephone Encounter (Signed)
Last visit 02-03-2009

## 2009-06-05 NOTE — Telephone Encounter (Signed)
This patient is due for a followup appointment. Please make an appointment for the   patient first. Please send the message back to me if a prescription or lab order is needed once the appointment has been made.

## 2009-06-05 NOTE — Telephone Encounter (Signed)
Last visit  02-03-2009  Last labs  10-09-2008

## 2009-06-11 MED ORDER — CARVEDILOL 25 MG PO TABS
25 MG | ORAL_TABLET | Freq: Two times a day (BID) | ORAL | Status: DC
Start: 2009-06-11 — End: 2009-09-08

## 2009-06-11 MED ORDER — PRAVASTATIN SODIUM 40 MG PO TABS
40 MG | ORAL_TABLET | Freq: Every evening | ORAL | Status: DC
Start: 2009-06-11 — End: 2009-11-02

## 2009-06-11 NOTE — Telephone Encounter (Signed)
Last visit  02-03-2009  Urgent  Last labs and regular visit  10-09-2008    Has appt  06-18-2009

## 2009-06-18 NOTE — Assessment & Plan Note (Signed)
Check labs  Looking better

## 2009-06-18 NOTE — Assessment & Plan Note (Signed)
Check lab

## 2009-06-18 NOTE — Assessment & Plan Note (Signed)
Cont med

## 2009-06-18 NOTE — Progress Notes (Signed)
Subjective:      Patient ID: Carl Blair is a 69 y.o. male.    HPI    Feeling good    On diet to lose weight, nutrisystem, discussed  On diabetic version  Lost weight  Glucoses better, "140"  Last check yesterday -- usually does 2 hr pp    Doesn't check bp at home  Monitoring, goals discussed      Review of Systems   Constitutional: Negative for fever, chills, activity change (treadmill and walking outside) and appetite change.   Respiratory: Negative for cough, shortness of breath (had a spell, saw pulmonologist, symbicort no help, lost weight, dyspnea gone) and wheezing.    Cardiovascular: Negative for chest pain, palpitations and leg swelling.   Gastrointestinal: Negative for abdominal pain and abdominal distention.   Neurological: Negative for weakness, numbness and headaches.   Hematological: Negative for adenopathy. Does not bruise/bleed easily.       Objective:   Physical Exam   Constitutional: He is oriented to person, place, and time. He appears well-developed and well-nourished.   Eyes: Pupils are equal, round, and reactive to light. No scleral icterus.   Cardiovascular: Normal rate, regular rhythm, normal heart sounds and intact distal pulses.    Pulmonary/Chest: Effort normal and breath sounds normal.   Abdominal: Soft. Bowel sounds are normal.   Musculoskeletal: He exhibits no edema.        Feet -- no sores seen  Normal pulses  Filament test normal bilaterally     Neurological: He is alert and oriented to person, place, and time.   Psychiatric: He has a normal mood and affect. His behavior is normal. Judgment and thought content normal.       Assessment:      1. 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 Vitamin D deficiency (268.9G)  VITAMIN D 25 HYDROXY   3. Esophageal reflux (530.81)     4. 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 Unspecified hypothyroidism (244.9)  TSH, T4, FREE   6. Unspecified essential  hypertension (401.9)     7. Other and unspecified hyperlipidemia (272.4)  LIPID PANEL, COMPREHENSIVE METABOLIC PANEL             Plan:      See progress note.

## 2009-06-19 LAB — COMPREHENSIVE METABOLIC PANEL
ALT: 20 U/L (ref 10–40)
AST: 22 U/L (ref 15–37)
Albumin/Globulin Ratio: 1.9 (ref 1.1–2.2)
Albumin: 4.4 g/dL (ref 3.4–5.0)
Alkaline Phosphatase: 84 U/L (ref 45–129)
BUN: 19 mg/dL — ABNORMAL HIGH (ref 7–18)
CO2: 29 meq/L (ref 21–32)
Calcium: 9.5 mg/dL (ref 8.3–10.6)
Chloride: 104 meq/L (ref 99–110)
Creatinine: 1.2 mg/dL (ref 0.8–1.3)
GFR Est, African/Amer: >60
GFR, Estimated: >60 (ref >60)
Glucose: 161 mg/dL — ABNORMAL HIGH (ref 70–99)
Potassium: 4.6 meq/L (ref 3.5–5.1)
Sodium: 141 meq/L (ref 136–145)
Total Bilirubin: 0.5 mg/dL (ref 0.0–1.0)
Total Protein: 6.7 g/dL (ref 6.4–8.2)

## 2009-06-19 LAB — LIPID PANEL
Cholesterol, Total: 128 mg/dl (ref <200)
HDL: 25 mg/dL — ABNORMAL LOW (ref 40–60)
LDL Calculated: 73 mg/dl (ref <100)
Triglycerides: 154 mg/dl — ABNORMAL HIGH (ref <150)
VLDL Cholesterol Calculated: 31 mg/dl

## 2009-06-19 LAB — MICROALBUMIN / CREATININE URINE RATIO: Creatinine, Ur: 149 mg/dl

## 2009-06-19 LAB — TSH
T4 Free: 1.45 ng/dL (ref 0.9–1.8)
TSH: 1.24 u[IU]/mL (ref 0.35–5.5)

## 2009-06-19 LAB — HEMOGLOBIN A1C
A1c: 6.3 — ABNORMAL HIGH (ref 4.0–6.0)
eAG: 134.1 mg/dl

## 2009-06-19 LAB — ALBUMIN/CREATININE RATIO, URINE
Albumin/Creatinine Ratio: 3.3 (ref 0.0–30.0)
Microalb, Ur: 0.49 mg/dL (ref 0.0–1.8)

## 2009-06-19 LAB — T4, FREE: T4 Free: 1.45 ng/dL (ref 0.9–1.8)

## 2009-06-20 LAB — VITAMIN D 25 HYDROXY: Vit D, 25-Hydroxy: 40 ng/mL (ref 30–80)

## 2009-08-18 MED ORDER — GLYBURIDE-METFORMIN 2.5-500 MG PO TABS
ORAL_TABLET | Freq: Two times a day (BID) | ORAL | Status: DC
Start: 2009-08-18 — End: 2009-11-02

## 2009-08-18 MED ORDER — OMEPRAZOLE 40 MG PO CPDR
40 MG | ORAL_CAPSULE | Freq: Every day | ORAL | Status: DC
Start: 2009-08-18 — End: 2009-11-02

## 2009-08-18 MED ORDER — VALSARTAN-HYDROCHLOROTHIAZIDE 320-25 MG PO TABS
320-25 MG | ORAL_TABLET | Freq: Every day | ORAL | Status: DC
Start: 2009-08-18 — End: 2009-11-02

## 2009-08-18 MED ORDER — LEVOTHYROXINE SODIUM 100 MCG PO TABS
100 MCG | ORAL_TABLET | Freq: Every day | ORAL | Status: DC
Start: 2009-08-18 — End: 2009-11-02

## 2009-08-18 NOTE — Telephone Encounter (Signed)
Last visit 06-18-2009

## 2009-09-08 NOTE — Telephone Encounter (Signed)
Last visit 06-19-2009

## 2009-11-02 MED ORDER — PRAVASTATIN SODIUM 40 MG PO TABS
40 MG | ORAL_TABLET | Freq: Every evening | ORAL | Status: DC
Start: 2009-11-02 — End: 2010-04-30

## 2009-11-02 MED ORDER — OMEPRAZOLE 40 MG PO CPDR
40 MG | ORAL_CAPSULE | Freq: Every day | ORAL | Status: DC
Start: 2009-11-02 — End: 2010-08-23

## 2009-11-02 MED ORDER — CARVEDILOL 25 MG PO TABS
25 MG | ORAL_TABLET | Freq: Two times a day (BID) | ORAL | Status: DC
Start: 2009-11-02 — End: 2010-04-30

## 2009-11-02 MED ORDER — LEVOTHYROXINE SODIUM 100 MCG PO TABS
100 MCG | ORAL_TABLET | Freq: Every day | ORAL | Status: DC
Start: 2009-11-02 — End: 2010-08-23

## 2009-11-02 MED ORDER — GLYBURIDE-METFORMIN 2.5-500 MG PO TABS
ORAL_TABLET | Freq: Two times a day (BID) | ORAL | Status: DC
Start: 2009-11-02 — End: 2010-11-30

## 2009-11-02 MED ORDER — VALSARTAN-HYDROCHLOROTHIAZIDE 320-25 MG PO TABS
320-25 MG | ORAL_TABLET | Freq: Every day | ORAL | Status: DC
Start: 2009-11-02 — End: 2010-08-23

## 2009-11-02 NOTE — Progress Notes (Signed)
Subjective:      Patient ID: Carl Blair is a 69 y.o. male.    HPI    Dyspnea is better, walking 2-3 miles a day    Doesn't check bp at home, has machine -- discussed    Fasting today    Glucoses 140 in am approximately, 2 hr pp  Not checking fasting    Heartburn good -- doesn't have on med    Review of Systems   Constitutional: Positive for activity change (improved). Negative for fever, chills and unexpected weight change (deliberate).   Respiratory: Negative for chest tightness, shortness of breath and wheezing.    Cardiovascular: Positive for palpitations. Negative for chest pain and leg swelling.   Gastrointestinal: Negative for abdominal pain, blood in stool and abdominal distention.   Genitourinary: Negative for dysuria and hematuria.        Nocturia x 1, occ 2   Musculoskeletal: Negative for myalgias, back pain and arthralgias.   Neurological: Negative for syncope and headaches.   Hematological: Negative for adenopathy. Does not bruise/bleed easily.       Objective:   Physical Exam   Constitutional: He is oriented to person, place, and time. He appears well-developed and well-nourished.   Eyes: Pupils are equal, round, and reactive to light. No scleral icterus.   Cardiovascular: Normal rate, regular rhythm, normal heart sounds and intact distal pulses.    Pulmonary/Chest: Effort normal and breath sounds normal.   Abdominal: Soft. Bowel sounds are normal.   Musculoskeletal: He exhibits no edema.        Feet -- no sores seen  Normal pulses  Filament test normal bilaterally     Neurological: He is alert and oriented to person, place, and time.   Psychiatric: He has a normal mood and affect. His behavior is normal. Judgment and thought content normal.       Assessment:      1. DM w/o complication type II  HM DIABETES FOOT EXAM, HEMOGLOBIN A1C   2. Need for TD vaccine  TD VACCINE =>7YO IM   3. Esophageal reflux     4. Unspecified hypothyroidism  T4, FREE, TSH   5. Special screening for malignant neoplasm of  prostate  PSA SCREENING, COMPREHENSIVE METABOLIC PANEL   6. Unspecified essential hypertension  COMPREHENSIVE METABOLIC PANEL, LIPID PANEL   7. Other and unspecified hyperlipidemia     8. Dyspnea     9. Other specified disorders of liver            Plan:      See progress note.

## 2009-11-02 NOTE — Assessment & Plan Note (Signed)
Check lfts

## 2009-11-02 NOTE — Assessment & Plan Note (Signed)
Check labs

## 2009-11-02 NOTE — Assessment & Plan Note (Signed)
Check lab

## 2009-11-02 NOTE — Assessment & Plan Note (Signed)
Cont med

## 2009-11-02 NOTE — Assessment & Plan Note (Signed)
Better  Weight loss helping, walking several miles a day now

## 2009-11-03 LAB — LIPID PANEL
Cholesterol, Total: 140 mg/dl (ref <200)
HDL: 29 mg/dl — ABNORMAL LOW (ref 40–60)
LDL Calculated: 89 mg/dl (ref <100)
Triglycerides: 110 mg/dl (ref <150)
VLDL Cholesterol Calculated: 22 mg/dl

## 2009-11-03 LAB — COMPREHENSIVE METABOLIC PANEL
ALT: 22 U/L (ref 10–40)
AST: 23 U/L (ref 15–37)
Albumin/Globulin Ratio: 2 (ref 1.1–2.2)
Albumin: 4.3 gm/dl (ref 3.4–5.0)
Alkaline Phosphatase: 79 U/L (ref 45–129)
BUN: 30 mg/dl — ABNORMAL HIGH (ref 7–18)
CO2: 31 mEq/L (ref 21–32)
Calcium: 9.2 mg/dl (ref 8.3–10.6)
Chloride: 106 mEq/L (ref 99–110)
Creatinine: 1 mg/dl (ref 0.8–1.3)
GFR Est, African/Amer: >60
GFR, Estimated: >60 (ref >60)
Glucose: 159 mg/dl — ABNORMAL HIGH (ref 70–99)
Potassium: 4.4 mEq/L (ref 3.5–5.1)
Sodium: 142 mEq/L (ref 136–145)
Total Bilirubin: 0.5 mg/dl (ref 0.0–1.0)
Total Protein: 6.5 gm/dl (ref 6.4–8.2)

## 2009-11-03 LAB — T4, FREE: T4 Free: 1.32 ng/dl (ref 0.9–1.8)

## 2009-11-03 LAB — TSH
T4 Free: 1.32 ng/dl (ref 0.9–1.8)
TSH: 1.25 u[IU]/mL (ref 0.35–5.5)

## 2009-11-03 LAB — HEMOGLOBIN A1C
A1c: 6.2 — ABNORMAL HIGH (ref 4.0–6.0)
eAG: 131.2 mg/dl

## 2009-11-03 LAB — PSA SCREENING: PSA: 0.51 ng/ml

## 2009-11-09 NOTE — Unmapped (Signed)
Signed by Generic  [facility] Provider on 11/09/2009 at 00:00:00  Medical Records Release      Imported By: Coletta Memos 11/13/2009 10:25:09    _____________________________________________________________________    External Attachment:    Please see [facility] EMR for this document.

## 2010-01-11 NOTE — Unmapped (Signed)
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 Epworth continues to reflect excessive daytime sleepiness (ESS 14). Sleep hx: in bed at midnight - 1 AM, waking up twice to void, out of bed by 8 AM. Wakes up refreshed. No Ha's. EDS continues to be associated with boredom, and never falls asleep when watching something interesting.  PMH: HTN, BP elevated today, but home logs are with diastolic < 80, DM with A1c 6.2 %. atrial fib (corrected with minimaze), asx GERD, dylipidemia.   Educated re: OSAHS pathophysiology, relationship with cardiovascular morbidity and mortality, issues related to CPAP adherence, its use, cleaning and maintenance. Patient verbalized understanding and agrees to comply. Patient was educated regarding avoiding driving while sleepy, specifically avoiding long distance or night driving pending successful treatment of this condition.  RTC annually.       Assessment    Problem List Updates for today's visit   1. 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 Ordered [phone] - Patient Encounter Documented Using an EHR Certified by [facility] [*CPT-G8447]  2. Ordered [phone] - Current Medications with Name, Dosages, Frequency and Route Documented [*[id number]]  3. Ordered 3016F - Unhealthy Alcohol Use Screening Performed [*CPT-3016F]  4. Ordered Benetta Spar Surdulescu MD [[id number]]  5. Ordered 99213 - Ofc Vst, Est Level III [id number]        Disposition:   Return to clinic for PA Visit in 1 year(s)   Appointment Reason: OSAHS    CC:        History of Present Illness    osa  Description of Sleep Problem:   Wake up during the night, Wake up early in the morning  Treatments Received:   CPAP  Has sleep quality improved?   Yes  Snoring?   No  Do you wake up refreshed?   Yes    NPSG:     Date  of NPSG:   05/27/2003  SE %:   63.5  AHI:   52  Lowest p ox %:   73  NPSG performed at:   [facility]    CPAP:     Date of CPAP: 12/06/2008  SE %:   76.5  AHI:   1.1  Lowest p ox %:   90.0  CPAP performed at:   [facility]  DME Company:   [employer]  Mask type:   nasal  cmH2O:   13    PAST HISTORY  Past Medical History (reviewed - no changes required):  Atrial Fibrillation, Hyperlipidemia, Hypertension, Dilated Cardiomyopathy, Diabetes Type II, Hypothyroidism, sleep apnea  Surgical History (reviewed - no changes required):  Septoplasty: ., UPPP, Hernia Surgery: right inguinal, mini maze 9-09,  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  sleep apnea surgery, has had esophageal dilations in the past (Dr. Karolee Stamps), No problems with anesthesia, No problems with healing, No problems with blood clots after surgery    Family History (reviewed - no changes required): Mother - Deceased - dm2, htn  Father - Deceased  Daughter - 0  Son - 0  Sister - dm2, cad - 2 sisters (1 is deceased)  Brother - dm2, cad, htn - 2 brothers  Social History (reviewed - no changes required): Preferred Language: English,   Marital Status: married,  Employment Status: retired,   Patient Lives at: home,   Support System: excellent  Exercise: not due to foot,   Caffeine per Day: 2  Seatbelt Use: 100 % of the time  Alcohol Use: none  Drug Use: none  Tobacco Usage:non-smoker    Review of Systems    See scanned review of systems.    Intake Comments:   annaual office visit; Last office visit-01/20/2009; ESS=12    VITAL SIGNS    Height: 72 inches    Weight: 256 lbs/ 116.12 kg.    BMI (in-lb): 34.85  Weight change of -10 lbs since previous visit.  ,    Blood Pressure: 141 / 87mm Hg,    Pulse rate: 65,    Respirations: 16,SaO2:   98 on   room air      MEDICATIONS (on Intake):    PRAVACHOL 40 MG TABS (PRAVASTATIN SODIUM) one by mouth every evening  DIOVAN HCT 320-25 MG TABS (VALSARTAN-HYDROCHLOROTHIAZIDE) one by mouth daily  COREG 25 MG TABS (CARVEDILOL) one by  mouth twice daily  LEVOTHYROXINE SODIUM 100 MCG TABS (LEVOTHYROXINE SODIUM) 1 by mouth daily  SAW PALMETTO EXTR (SAW PALMETTO) 1 by mouth daily  GLUCOSAMINE SULFATE POWD (GLUCOSAMINE SULFATE)   VIAGRA 100 MG TABS (SILDENAFIL CITRATE) one-half of a pill daily prn  GLUCOVANCE 2.5-500 MG TABS (GLYBURIDE-METFORMIN) one by mouth twice a day  OMEPRAZOLE 40 MG CPDR (OMEPRAZOLE) one by mouth daily  ANACIN 81 MG TBEC (ASPIRIN)   OSTEO BI-FLEX JOINT SHIELD  TABS (MISC NATURAL PRODUCTS)    Pneumovax:     PNEUMOVAX (05/31/2002 2:13:39 PM)  Flu Vaccine:     done at [facility] (01/20/2007 11:47:33 AM)    Intake recorded by:  Joslyn Devon  January 11, 2010 9:14 AM      Medications reviewed, updated and verified with patient or patient representative.    Screening for unhealthy alcohol use performed.  Men  (Age 71 or Under): nondrinker    Depression Screening:     During the past month,   Have you often been bothered by feeling down, depressed or hopeless? No  Have you often been bothered by little interest or pleasure in doing things? Yes    Physical Examination  Constitutional:  well nourished, well groomed, no respiratory distress, not using expiratory accessory muscles, speaking in full sentences.  Ears/Nose/Throat:  external nose normal, no nasal polyps, no deviated septum, no sinus tenderness, tongue normal, no thrush, normal uvula, no tonsillar hypertrophy, normal dentition.  Neck:  supple, no thyroid masses or nodules, no thyromegaly, no tracheal shift, JVP-pulsatile.  Chest:  no deformities, no chest wall tenderness, normal chest expansion/symmetry, no wheezing, no crackles, no rhonchi.  Cardiovascular:  pulses 2+ and symmetric, no carotid bruits, no peripheral edema, no murmur, no rub, no gallop, regular rate and rhythm, PMI = 5th LISC MCL.  Neurological:  appropriate mental status.  Psychological:  oriented to time; place; and person, affect and mood appropriate, normal interaction.    EPWORTH SLEEPINESS SCALE   1. How  likely are you to doze off or fall asleep while sitting and reading?        - patient indicates a score of:  2  2. How likely are you to doze off or fall asleep while watching television?        - patient indicates a score of:  3  3. How likely are you to doze off or fall asleep while in a theater or meeting?        -  patient indicates a score of:  0  4. How likely are you to doze off or fall asleep while traveling as a passenger?        - patient indicates a score of:  3  5. How likely are you to doze off or fall asleep while resting in the afternoon?        - patient indicates a score of:  3  6. How likely are you to doze off or fall asleep while sitting & talking to someone?        - patient indicates a score of:  0  7. How likely are you to doze off or fall asleep while sitting quietly after a meal?        - patient indicates a score of:  3  8. How likely are you to doze off or fall asleep while sitting in a car stopped in traffic?        - patient indicates a score of:  0                                                            Total Score: 14                  ]

## 2010-01-11 NOTE — Unmapped (Signed)
Signed by Pilar Jarvis PA on 01/11/2010 at 00:00:00  Compliance Report      Imported By: Coletta Memos 01/21/2010 11:55:42    _____________________________________________________________________    External Attachment:    Please see [facility] EMR for this document.

## 2010-01-11 NOTE — Unmapped (Signed)
Signed by Pilar Jarvis PA on 01/11/2010 at 00:00:00  Sleep Medicine      Imported By: Coletta Memos 01/21/2010 11:55:32    _____________________________________________________________________    External Attachment:    Please see [facility] EMR for this document.

## 2010-02-19 MED ORDER — GLYBURIDE-METFORMIN 2.5-500 MG PO TABS
ORAL_TABLET | Freq: Two times a day (BID) | ORAL | Status: DC
Start: 2010-02-19 — End: 2010-08-23

## 2010-02-19 MED ORDER — VALSARTAN-HYDROCHLOROTHIAZIDE 320-25 MG PO TABS
320-25 MG | ORAL_TABLET | Freq: Every day | ORAL | Status: DC
Start: 2010-02-19 — End: 2010-06-09

## 2010-02-19 MED ORDER — LEVOTHYROXINE SODIUM 100 MCG PO TABS
100 MCG | ORAL_TABLET | Freq: Every day | ORAL | Status: DC
Start: 2010-02-19 — End: 2010-06-09

## 2010-02-19 MED ORDER — OMEPRAZOLE 40 MG PO CPDR
40 MG | ORAL_CAPSULE | Freq: Every day | ORAL | Status: DC
Start: 2010-02-19 — End: 2010-06-09

## 2010-02-19 NOTE — Telephone Encounter (Signed)
[facility]axed to [facility]

## 2010-02-19 NOTE — Telephone Encounter (Signed)
Patient needs refills omeprazole 40 mg and diovan hctz 320 mg/25 ,levoxyl 0.1mg ,gliyburide/matform 2.5/500 called to [facility] [phone]  cb [phone]

## 2010-02-19 NOTE — Telephone Encounter (Signed)
Last visit 11-02-2009

## 2010-03-11 NOTE — Unmapped (Signed)
[facility]     PATIENT NAME:   [name], [name]                MR #:  [id number]   DATE OF BIRTH:  09/15/1940                        ACCOUNT #:  [id number]   ED PHYSICIAN:   Frederik Schmidt, M.D.       ROOM #:   PRIMARY:        Mearl Latin, M.D.           NURSING UNIT:  ED   REFERRING:                                        FC:  G   DICTATED BY:    Evelene Croon, P.A.              ADMIT DATE:  03/11/2010   VISIT DATE:                                       DISCHARGE DATE:                               EMERGENCY DEPARTMENT NOTE     *-*-*     CHIEF COMPLAINT:  Left rib pain.     HISTORY OF PRESENT ILLNESS:  This is a very nice 69 year old gentleman who   presents with pain in the left ribs.  This is a gentleman who had a fall   about five days ago.  He was working on the roof of a lattice when he went   through the roof and got stuck between two beams.  He was able to free   himself; however, in turn her managed to injure the left side of his ribs.   He went to Urgent Care who evaluated in him and put him on some pain   medicine.  He states the pain medicine has helped it a bit, but he continues   to have pain in the side.  He continues to have bruising about the area, and   he is now constipated.     PAST MEDICAL HISTORY:     1.  Atrial fibrillation with a history of mini maze procedure.   2.  Diabetes.   3.  Hypertension.   4.  Hypothyroidism.   5.  Reflux disease.     SOCIAL HISTORY:  No alcohol, tobacco or illicit drugs.     MEDICATIONS:  See ED notes.     ALLERGIES:  None.     REVIEW OF SYSTEMS:  As mentioned, otherwise negative.     PHYSICAL EXAMINATION:     VITAL SIGNS:  Blood pressure 133/74, pulse 56, respirations 18, temperature   98.3, O2 sat 98% on room air.   GENERAL:  Awake, alert and oriented, no acute distress.   HEENT:  Normocephalic, atraumatic.  Pupils are equal, round and reactive to   light and accommodation.  Extraocular movements are intact.   CHEST:  Clear breath sounds bilaterally with no wheezes, rales or rhonchi.   HEART:  Regular rate and rhythm with no murmurs, rubs or gallops.  ABDOMEN:  Soft, nontender, nondistended, good bowel sounds with no   organomegaly.   MUSCULOSKELETAL:  He has tenderness to palpation mainly in the inferior most   ribs laterally on the left.  He does have a little bit of bruising in this   area as well.  He has no clicking.  He has no evidence of any crepitus.     EMERGENCY DEPARTMENT COURSE:  This gentleman was seen and evaluated in the   emergency room for left-sided rib pain.  Here, he had x-rays done that showed   no pneumothorax.     I did get a CT scan of the abdomen with IV contrast that did not show any   evidence of a splenic laceration.  It does confirm the rib fractures.     At this time, he is resting comfortably.  He will be discharged home in   stable condition.     DIAGNOSIS:     1.  Rib fracture.     PLAN:     1.  Percocet as needed for pain.   2.  Incentive spirometer every one hour.   3.  Return if anything worsens.   4.  Return for any fevers.       *-*-*                                             _______________________________________   RA/tlm                                 _____   D:  03/11/2010 14:55                  Evelene Croon, P.A.   T:  03/11/2010 20:24   Job #:  [id number]                                         _______________________________________                                          _____                                         Frederik Schmidt, M.D.                                  EMERGENCY DEPARTMENT NOTE                                                                PAGE    1 of   1

## 2010-03-11 NOTE — Unmapped (Signed)
[facility]     PATIENT NAME:   [name], [name]                MR #:  [id number]   DATE OF BIRTH:  1940-04-23                        ACCOUNT #:  [id number]   ED PHYSICIAN:   Frederik Schmidt, M.D.       ROOM #:   PRIMARY:        Mearl Latin, M.D.           NURSING UNIT:  ED   REFERRING:                                        FC:  G   DICTATED BY:    Frederik Schmidt, M.D.       ADMIT DATE:  03/11/2010   VISIT DATE:     03/11/2010                        DISCHARGE DATE:                               EMERGENCY DEPARTMENT NOTE     *-*-*     ***ADDENDUM - 03/11/10 - TRW***     EMERGENCY DEPARTMENT COURSE:  I have seen and evaluated the patient,   discussed the patient with the PA and I agree with the assessment and plan.         *-*-*                                             _______________________________________   BAS/trw                                _____   D:  03/11/2010 13:04                  Frederik Schmidt, M.D.   T:  03/11/2010 14:47   Job #:  [id number]                                    EMERGENCY DEPARTMENT NOTE                                                                PAGE    1 of   1                                    EMERGENCY DEPARTMENT NOTE  PAGE    1 of   1

## 2010-03-11 NOTE — ED Notes (Unsigned)
[facility]    PATIENT NAME:   [name], Emitt R.                MR #:  [id number]  DATE OF BIRTH:  October 21, 1940                        ACCOUNT #:  [id number]  ED PHYSICIAN:   Frederik SchmidtBrian Arthur Stettler, M.D.       ROOM #:  PRIMARY:        Mearl LatinLawrence E. Neack, M.D.           NURSING UNIT:  ED  REFERRING:                                        FC:  G  DICTATED BY:    Evelene Croonyan Armentano, P.A.              ADMIT DATE:  03/11/2010  VISIT DATE:                                       DISCHARGE DATE:                              EMERGENCY DEPARTMENT NOTE    *-*-*    CHIEF COMPLAINT:  Left rib pain.    HISTORY OF PRESENT ILLNESS:  This is a very nice 69 year old gentleman who  presents with pain in the left ribs.  This is a gentleman who had a fall  about five days ago.  He was working on the roof of a lattice when he went  through the roof and got stuck between two beams.  He was able to free  himself; however, in turn her managed to injure the left side of his ribs.  He went to Urgent Care who evaluated in him and put him on some pain  medicine.  He states the pain medicine has helped it a bit, but he continues  to have pain in the side.  He continues to have bruising about the area, and  he is now constipated.    PAST MEDICAL HISTORY:    1.  Atrial fibrillation with a history of mini maze procedure.  2.  Diabetes.  3.  Hypertension.  4.  Hypothyroidism.  5.  Reflux disease.    SOCIAL HISTORY:  No alcohol, tobacco or illicit drugs.    MEDICATIONS:  See ED notes.    ALLERGIES:  None.    REVIEW OF SYSTEMS:  As mentioned, otherwise negative.    PHYSICAL EXAMINATION:    VITAL SIGNS:  Blood pressure 133/74, pulse 56, respirations 18, temperature  98.3, O2 sat 98% on room air.  GENERAL:  Awake, alert and oriented, no acute distress.  HEENT:  Normocephalic, atraumatic.  Pupils are equal, round and reactive to  light and accommodation.  Extraocular movements are intact.  CHEST:  Clear breath sounds  bilaterally with no wheezes, rales or rhonchi.  HEART:  Regular rate and rhythm with no murmurs, rubs or gallops.  ABDOMEN:  Soft, nontender, nondistended, good bowel sounds with no  organomegaly.  MUSCULOSKELETAL:  He has tenderness to palpation mainly in the inferior most  ribs laterally on the left.  He does have a  little bit of bruising in this  area as well.  He has no clicking.  He has no evidence of any crepitus.    EMERGENCY DEPARTMENT COURSE:  This gentleman was seen and evaluated in the  emergency room for left-sided rib pain.  Here, he had x-rays done that showed  no pneumothorax.    I did get a CT scan of the abdomen with IV contrast that did not show any  evidence of a splenic laceration.  It does confirm the rib fractures.    At this time, he is resting comfortably.  He will be discharged home in  stable condition.    DIAGNOSIS:    1.  Rib fracture.    PLAN:    1.  Percocet as needed for pain.  2.  Incentive spirometer every one hour.  3.  Return if anything worsens.  4.  Return for any fevers.      *-*-*                                            _______________________________________  RA/tlm                                 _____  D:  03/11/2010 14:55                  Evelene Croon, P.A.  T:  03/11/2010 20:24  Job #:  [id number]                                        _______________________________________                                         _____                                        Frederik Schmidt, M.D.                                 EMERGENCY DEPARTMENT NOTE                                                               PAGE    1 of   1

## 2010-03-11 NOTE — ED Notes (Unsigned)
[facility]    PATIENT NAME:   [name], Kasch R.                MR #:  [id number]  DATE OF BIRTH:  [birth year]                        ACCOUNT #:  [id number]  ED PHYSICIAN:   Frederik SchmidtBrian Arthur Stettler, M.D.       ROOM #:  PRIMARY:        Mearl LatinLawrence E. Neack, M.D.           NURSING UNIT:  ED  REFERRING:                                        FC:  G  DICTATED BY:    Frederik SchmidtBrian Arthur Stettler, M.D.       ADMIT DATE:  03/11/2010  VISIT DATE:     03/11/2010                        DISCHARGE DATE:                              EMERGENCY DEPARTMENT NOTE    *-*-*    ***ADDENDUM - 03/11/10 - TRW***    EMERGENCY DEPARTMENT COURSE:  I have seen and evaluated the patient,  discussed the patient with the PA and I agree with the assessment and plan.        *-*-*                                            _______________________________________  BAS/trw                                _____  D:  03/11/2010 13:04                  Frederik SchmidtBrian Arthur Stettler, M.D.  T:  03/11/2010 14:47  Job #:  [id number]                                   EMERGENCY DEPARTMENT NOTE                                                               PAGE    1 of   1

## 2010-04-30 MED ORDER — CARVEDILOL 25 MG PO TABS
25 MG | ORAL_TABLET | Freq: Two times a day (BID) | ORAL | Status: DC
Start: 2010-04-30 — End: 2010-08-23

## 2010-04-30 MED ORDER — PRAVASTATIN SODIUM 40 MG PO TABS
40 MG | ORAL_TABLET | Freq: Every evening | ORAL | Status: DC
Start: 2010-04-30 — End: 2010-05-07

## 2010-04-30 NOTE — Telephone Encounter (Signed)
Last visit 11-02-2009

## 2010-05-04 ENCOUNTER — Encounter

## 2010-05-04 NOTE — Assessment & Plan Note (Signed)
Check labs  discussed

## 2010-05-04 NOTE — Assessment & Plan Note (Signed)
Check labs

## 2010-05-04 NOTE — Progress Notes (Signed)
Subjective:      Patient ID: Carl Blair is a 70 y.o. male.    HPI    Last two weeks back to walking 4 miles a day  Broken ribs had stopped him    Glucose -- didn't bring the records or monitor  Running 180 2 hr pp  Checks fasting sometimes -- this am 196 fasting  Weight discussed, has regained most of his weight  Had a party last night, ate pizza    Doesn't check bp  Discussed goals    No change temp tolerance    Review of Systems   Constitutional: Positive for activity change and unexpected weight change. Negative for fever, chills and appetite change.   Respiratory: Negative for chest tightness, shortness of breath (gone away for the most part, much better) and wheezing.    Cardiovascular: Negative for chest pain and leg swelling.   Gastrointestinal: Negative for abdominal pain, diarrhea and constipation.        Due soon for repeat colonoscopy, knows   Genitourinary:        Viagra didn't help him  [facility]   Musculoskeletal: Negative for myalgias and arthralgias.   Neurological: Negative for syncope and headaches.   Hematological: Negative for adenopathy. Does not bruise/bleed easily.       Objective:   Physical Exam   Constitutional: He is oriented to person, place, and time. He appears well-developed and well-nourished.   Eyes: Pupils are equal, round, and reactive to light. No scleral icterus.   Cardiovascular: Normal rate, regular rhythm, normal heart sounds and intact distal pulses.    Pulmonary/Chest: Effort normal and breath sounds normal.   Abdominal: Soft. Bowel sounds are normal.   Musculoskeletal: He exhibits no edema.        Feet -- no sores seen  Normal pulses  Filament test normal bilaterally     Neurological: He is alert and oriented to person, place, and time.   Psychiatric: He has a normal mood and affect. His behavior is normal. Judgment and thought content normal.       Assessment:      1. DM w/o Complication Type II  Hemoglobin A1c, Microalbumin / creatinine urine  ratio, HM DIABETES FOOT EXAM   2. Unspecified hypothyroidism  TSH, T4, free   3. Unspecified essential hypertension  Comprehensive metabolic panel   4. Other and unspecified hyperlipidemia  Comprehensive metabolic panel, CK, Lipid panel   5. Dyspnea            Plan:      See progress note.

## 2010-05-04 NOTE — Assessment & Plan Note (Signed)
better 

## 2010-05-04 NOTE — Assessment & Plan Note (Signed)
Good control

## 2010-05-04 NOTE — Assessment & Plan Note (Signed)
Check lab

## 2010-05-05 LAB — COMPREHENSIVE METABOLIC PANEL
ALT: 27 U/L (ref 10–40)
AST: 26 U/L (ref 15–37)
Albumin/Globulin Ratio: 1.8 (ref 1.1–2.2)
Albumin: 4.3 gm/dl (ref 3.4–5.0)
Alkaline Phosphatase: 103 U/L (ref 45–129)
BUN: 29 mg/dl — ABNORMAL HIGH (ref 7–18)
CO2: 30 mEq/L (ref 21–32)
Calcium: 9.4 mg/dl (ref 8.3–10.6)
Chloride: 104 mEq/L (ref 99–110)
Creatinine: 1.1 mg/dl (ref 0.8–1.3)
GFR Est, African/Amer: >60
GFR, Estimated: >60 (ref >60)
Glucose: 160 mg/dl — ABNORMAL HIGH (ref 70–99)
Potassium: 4.6 mEq/L (ref 3.5–5.1)
Sodium: 143 mEq/L (ref 136–145)
Total Bilirubin: 0.4 mg/dl (ref 0.0–1.0)
Total Protein: 6.7 gm/dl (ref 6.4–8.2)

## 2010-05-05 LAB — LIPID PANEL
Cholesterol, Total: 147 mg/dl (ref <200)
HDL: 34 mg/dl — ABNORMAL LOW (ref 40–60)
LDL Calculated: 96 mg/dl (ref <100)
Triglycerides: 88 mg/dl (ref <150)
VLDL Cholesterol Calculated: 18 mg/dl

## 2010-05-05 LAB — CK: Total CK: 325 U/L — ABNORMAL HIGH (ref 38–174)

## 2010-05-05 LAB — HEMOGLOBIN A1C
A1c: 6.8 — ABNORMAL HIGH (ref 4.0–6.0)
eAG: 148.5 mg/dl

## 2010-05-05 LAB — TSH
T4 Free: 1.31 ng/dl (ref 0.9–1.8)
TSH: 1.19 u[IU]/mL (ref 0.35–5.5)

## 2010-05-05 LAB — MICROALBUMIN / CREATININE URINE RATIO
Creatinine, Ur: 154 mg/dl
Microalbumin Creatinine Ratio: 10.3 (ref 0.0–30.0)
Microalbumin, Random Urine: 1.59 mg/dl (ref 0.0–1.8)

## 2010-05-05 LAB — T4, FREE: T4 Free: 1.31 ng/dl (ref 0.9–1.8)

## 2010-05-07 ENCOUNTER — Encounter

## 2010-05-07 LAB — CK: Total CK: 282 U/L — ABNORMAL HIGH (ref 38–174)

## 2010-06-09 ENCOUNTER — Inpatient Hospital Stay

## 2010-06-09 LAB — CBC WITH AUTO DIFFERENTIAL
Atypical Lymphocytes Relative: 1 % (ref 0.0–6.0)
Bands Relative: 1 % (ref 0.0–7.0)
Eosinophils %: 3 % (ref 0.0–5.0)
Eosinophils Absolute: 0.5 10*3 (ref 0.0–0.6)
Granulocyte Absolute Count: 10.6 10*3 — ABNORMAL HIGH (ref 1.7–7.7)
Hematocrit: 42.5 % (ref 40.5–52.5)
Hemoglobin: 14.2 gm/dl (ref 13.5–17.5)
Lymphocytes %: 16 % — ABNORMAL LOW (ref 25.0–40.0)
Lymphocytes Absolute: 2.6 10*3 (ref 1.0–5.1)
MCH: 29 pg (ref 26–34)
MCHC: 33.4 gm/dl (ref 31–36)
MCV: 86.9 fl (ref 80–100)
MPV: 7.5 fl (ref 5.0–10.5)
Monocytes %: 10 % (ref 0.0–12.0)
Monocytes Absolute: 1.5 10*3 — ABNORMAL HIGH (ref 0.0–1.3)
Platelets: 152 10*3 (ref 135–450)
RBC Morphology: NORMAL
RBC: 4.89 10*6 (ref 4.2–5.9)
RDW: 14.6 % — ABNORMAL HIGH (ref 11.5–14.5)
Segs Relative: 69 % — ABNORMAL HIGH (ref 42.0–63.0)
WBC: 15.2 10*3 — ABNORMAL HIGH (ref 4.0–11.0)

## 2010-06-09 LAB — BASIC METABOLIC PANEL
BUN: 21 mg/dl — ABNORMAL HIGH (ref 7–18)
CO2: 31 mEq/L (ref 21–32)
Calcium: 9 mg/dl (ref 8.3–10.6)
Chloride: 100 mEq/L (ref 99–110)
Creatinine: 1.2 mg/dl (ref 0.8–1.3)
GFR Est, African/Amer: >60
GFR, Estimated: >60 (ref >60)
Glucose: 202 mg/dl — ABNORMAL HIGH (ref 70–99)
Potassium: 4.7 mEq/L (ref 3.5–5.1)
Sodium: 138 mEq/L (ref 136–145)

## 2010-06-09 LAB — CK: Total CK: 387 U/L — ABNORMAL HIGH (ref 38–174)

## 2010-06-09 MED ORDER — CIPROFLOXACIN HCL 500 MG PO TABS
500 MG | ORAL_TABLET | Freq: Two times a day (BID) | ORAL | Status: AC
Start: 2010-06-09 — End: 2010-06-19

## 2010-06-09 MED ORDER — BENZONATATE 100 MG PO CAPS
100 MG | ORAL_CAPSULE | Freq: Three times a day (TID) | ORAL | Status: DC | PRN
Start: 2010-06-09 — End: 2010-10-21

## 2010-06-09 NOTE — Patient Instructions (Addendum)
Monitor your blood glucose readings frequently (2-3x/day) while on the Cipro. Cipro and the Glyburide can lower your blood glucose more than anticipated. Watch for signs/symptoms of hypoglycemia (sweats, confusion, shakiness).  ________________________________________________________________________      Mucinex (Blue box) 600-1200mg  every 12 hours x 7-10 days    Cipro as prescribed.    ________________________________________________________________________    Follow up here in 1 week. Call sooner, if you are not feeling improvement on the Cipro, Mucinex.

## 2010-06-09 NOTE — Progress Notes (Signed)
Subjective:      Patient ID: Carl Blair is a 69 y.o. male.  Chief Complaint   Patient presents with   ??? Cough     Pt states he has a deep cough onset one week ago. Pt has tried multiple OTC meds with no signs of getting better.  Runny nose, HA has subsided but cough still lingers.        HPI  Pt states wife ill - now on antibiotics.  Pt's sxs started abuot 1 week ago.  Deep cough - dry. Nothing coming up.  Lower chest discomfort with coughing; wheezing; SOB.  Feeling achy with chills.    He denies ST.  Rhinorrhea, HA initially - none today.    Tried OTC robitussin w/o relief.    Review of Systems   Constitutional: Positive for chills and fatigue. Negative for fever and appetite change.   HENT: Positive for hearing loss. Negative for ear pain, nosebleeds, congestion, sore throat, facial swelling, rhinorrhea, sneezing, neck pain, neck stiffness, voice change, postnasal drip, sinus pressure, tinnitus and ear discharge.    Respiratory: Positive for cough, shortness of breath and wheezing. Negative for chest tightness.    Cardiovascular: Negative for chest pain and palpitations.   Gastrointestinal: Negative for nausea, vomiting, abdominal pain and diarrhea.   Genitourinary: Negative for urgency, frequency, discharge, penile swelling, scrotal swelling, enuresis, difficulty urinating, penile pain and testicular pain.        Carl Blair to [facility] for eval of ED. Tells me his testosterone was low. He does not intend to return there.       Musculoskeletal: Negative for myalgias and arthralgias.   Skin: Negative for rash.   Neurological: Negative for dizziness and headaches.       Objective:   Physical Exam   Nursing note and vitals reviewed.  Constitutional: He is oriented to person, place, and time. He appears well-developed and well-nourished. He does not have a sickly appearance. He does not appear ill. No distress.        BP 134/72   Pulse 77   Temp(Src) 97.2 ??F (36.2 ??C) (Oral)   Wt 265 lb 9.6 oz (120.475 kg)    SpO2 95%     HENT:   Head: Normocephalic and atraumatic.   Right Ear: Hearing, tympanic membrane, external ear and ear canal normal.   Left Ear: Hearing, tympanic membrane, external ear and ear canal normal.   Nose: No mucosal edema, rhinorrhea or septal deviation. No epistaxis. Right sinus exhibits no maxillary sinus tenderness and no frontal sinus tenderness. Left sinus exhibits no maxillary sinus tenderness and no frontal sinus tenderness.   Mouth/Throat: Uvula is midline, oropharynx is clear and moist and mucous membranes are normal. No uvula swelling. No oropharyngeal exudate, posterior oropharyngeal edema, posterior oropharyngeal erythema or tonsillar abscesses.        Ceruminosis is noted.  Wax is removed by syringing and manual debridement.    Cerumen removed without difficulty - with H20:H2O2 solution (1:1). Pt tolerated procedure well.    Instructions for home care to prevent wax buildup are given.   Eyes: Conjunctivae, EOM and lids are normal. Pupils are equal, round, and reactive to light. No foreign bodies found. Right eye exhibits no discharge and no exudate. Left eye exhibits no discharge and no exudate. Right conjunctiva is not injected. Left conjunctiva is not injected.   Neck: Trachea normal, normal range of motion and full passive range of motion without pain. Neck supple. No tracheal deviation  present. No mass and no thyromegaly present.   Cardiovascular: Normal rate, regular rhythm, normal heart sounds and intact distal pulses.  Exam reveals no gallop and no friction rub.    No murmur heard.  Pulmonary/Chest: Effort normal. No respiratory distress. He has no decreased breath sounds. He has wheezes. He has rhonchi. He has rales in the left lower field. He exhibits no tenderness.        Dullness with percussion; egophony LLL as well.   Lymphadenopathy:        Head (right side): No submental, no submandibular, no tonsillar, no preauricular, no posterior auricular and no occipital adenopathy  present.        Head (left side): No submental, no submandibular, no tonsillar, no preauricular, no posterior auricular and no occipital adenopathy present.     He has no cervical adenopathy.   Neurological: He is alert and oriented to person, place, and time.   Skin: Skin is warm and dry. No rash noted.   Psychiatric: He has a normal mood and affect. His behavior is normal. Judgment normal.       Assessment:      1. Pneumonia  X-ray chest PA and lateral, ciprofloxacin (CIPRO) 500 MG tablet, CBC auto differential, Basic metabolic panel    LLL suspected   2. Cough  benzonatate (TESSALON PERLES) 100 MG capsule   3. Hearing loss  PR REMOVE IMPACTED EAR WAX   4. Impacted cerumen of both ears  PR REMOVE IMPACTED EAR WAX   5. Elevated CK     6. DM w/o Complication Type II  Basic metabolic panel           Plan:      LLL pneumonia is suspected.  Cipro, CXR, labs.  FOV 1 week.    Will recheck CPK today.    It was recommended he try Ceruminex or Debrox 1-2x/week to keep the ear wax softened. He can flush with bulb syringe, if needed. If there is pain, avoid flushing the ear and come in for eval.    Avoid Q-tips as this will pack the cerumen in deeper. He should return if he continues to have difficulties despite these guidelines.    For his ED - I am uncertain if Dr. Newman Pies has received/reviewed records from the [facility] as there is nothing scanned in his chart. I have asked [name] to f/u with Dr. Newman Pies regarding this.

## 2010-06-11 NOTE — Telephone Encounter (Signed)
The infection is probably driving up the glucose, not the cipro.

## 2010-06-11 NOTE — Telephone Encounter (Signed)
Patient had a chest xray 2/29 and would like to know if the results are back.

## 2010-06-11 NOTE — Telephone Encounter (Signed)
No pneumonia. Let me know if he is not feeling better.

## 2010-06-11 NOTE — Telephone Encounter (Signed)
Pt notified of these results.  States he is feeling a lot better.  Also states the antibiotic he is taking is making his blood suger high.  Yesterday AM  340, this AM  270.  He stated that he will have to stop taking the med and I told him to wait until I let you know.

## 2010-07-14 ENCOUNTER — Inpatient Hospital Stay

## 2010-07-20 ENCOUNTER — Encounter

## 2010-07-20 LAB — VITAMIN D 25 HYDROXY: Vit D, 25-Hydroxy: 11 ng/ml — ABNORMAL LOW

## 2010-07-20 LAB — CK: Total CK: 179 U/L — ABNORMAL HIGH (ref 38–174)

## 2010-07-26 NOTE — Telephone Encounter (Signed)
Patient had blood drawn last Monday and has never received the results.  cb [phone]

## 2010-07-26 NOTE — Telephone Encounter (Signed)
His CK was almost down to normal, but his vitamin D level was very low.  I want him to take 8000 units a day for eight weeks (over the counter) and then check another vitamin D level.  The order for this has been written and is in the computer system at the office.

## 2010-07-26 NOTE — Telephone Encounter (Signed)
Talked with  patient

## 2010-07-26 NOTE — Telephone Encounter (Signed)
No lab letter yet

## 2010-07-27 ENCOUNTER — Encounter

## 2010-07-27 MED ORDER — ERGOCALCIFEROL 1.25 MG (50000 UT) PO CAPS
1.25 MG (50000 UT) | ORAL_CAPSULE | ORAL | Status: DC
Start: 2010-07-27 — End: 2011-01-18

## 2010-08-12 NOTE — Unmapped (Signed)
[facility]     PATIENT NAME:   [name], [name]                MR #:  [id number]   DATE OF BIRTH:  03-28-1941                        ACCOUNT #:  [id number]   ED PHYSICIAN:   Cherly Anderson, M.D.                 ROOM #:   PRIMARY:        Mearl Latin, M.D.           NURSING UNIT:  ED   REFERRING:                                        FC:  M   DICTATED BY:    Cherly Anderson, M.D.                 ADMIT DATE:  08/12/2010   VISIT DATE:     08/12/2010                        DISCHARGE DATE:                               EMERGENCY DEPARTMENT NOTE     *-*-*     ***ADDENDUM - 08/12/10 - ljw***     The patient was initially seen and evaluated by emergency medicine physician   assistant, [place] Moser.  I have personally seen and examined the patient,   discussed treatment plan and evaluation with the emergency medicine physician   assistant.  I agree with his treatment plan and evaluation and have no   further addendums at this time.       *-*-*                                             _______________________________________   AW/ljw                                 _____   D:  08/12/2010 11:08                  Cherly Anderson, M.D.   T:  08/12/2010 15:21   Job #:  [id number]                                    EMERGENCY DEPARTMENT NOTE                                                                PAGE    1 of   1   AW/ljw  _____   D:  08/12/2010 11:08                  Cherly Anderson, M.D.   T:  08/12/2010 15:21   Job #:  [id number]                                    EMERGENCY DEPARTMENT NOTE                                                                PAGE    1 of   1

## 2010-08-12 NOTE — Unmapped (Signed)
[facility]     PATIENT NAME:   [name], [name]                MR #:  [id number]   DATE OF BIRTH:  1940/11/28                        ACCOUNT #:  [id number]   ED PHYSICIAN:   Cherly Anderson, M.D.                 ROOM #:   PRIMARY:        Mearl Latin, M.D.           NURSING UNIT:  ED   REFERRING:                                        FC:  M   DICTATED BY:    [place] Milam Allbaugh, P.A.                  ADMIT DATE:  08/12/2010   VISIT DATE:                                       DISCHARGE DATE:                               EMERGENCY DEPARTMENT NOTE     *-*-*     CHIEF COMPLAINT:  Left wrist injury.     HISTORY OF PRESENT ILLNESS:  This is a 70 year old gentleman who presents   with 8/10 pain located in the left wrist after he was running and fell.  He   landed on an outstretched arm.  He presents with a throbbing, constant pain,   worse with waking up this morning as the injury happened yesterday, located   to the ulnar aspect of the left wrist.  Denies any radiation of the pain, any   numbness, tingling, weakness distally or any other symptoms at this time.  He   went to Urgent Care yesterday for right wrist pain, but the left wrist at   that time felt fine.     PAST MEDICAL HISTORY:     1.  GERD.   2.  Diabetes.   3.  Hypertension.   4.  Hypothyroidism.   5.  High cholesterol.     MEDICATIONS:  Per nursing notes.     ALLERGIES:  None.     SOCIAL HISTORY:  Negative for tobacco, alcohol or drugs.     REVIEW OF SYSTEMS:  As above and negative per patient.     PHYSICAL EXAMINATION:     VITAL SIGNS:  Upon arrival to the ED, blood pressure 108/67, pulse 61,   respirations 16, temperature 98, O2 sat 98%.   GENERAL:  Well-developed, in no distress.   SKIN:  Warm, dry to touch.   HEENT:  Normocephalic, atraumatic.  Pupils equal, round.   EXTREMITIES:  Tender to palpation to the ulnar styloid of the left wrist.   Otherwise, 5/5 strength, full range of motion.  No hand or scaphoid   tenderness.   NEUROLOGIC:  Neurovascularly intact.     EMERGENCY DEPARTMENT  COURSE/IMPRESSION:  The patient was seen and examined   by  myself and presented to Dr. Daleen Squibb.  Bilateral wrist x-rays showed no   evidence for fracture.     IMPRESSION:  A 70 year old gentleman with bilateral wrist sprains; however,   his right wrist was previously diagnosed at the Urgent Care yesterday.  His   left wrist had delayed onset of pain.  No evidence of fracture on x-ray.  He   was given Tylenol #3 for pain.  Will have him also take over-the-counter   NSAIDs.  We gave him a Velcro wrist splint.     DIAGNOSIS:     1.  Left wrist sprain.     PLAN:     1.  RICE.   2.  NSAIDs.   3.  Continue Tylenol #3.   4.  Return for worsening symptoms.     DISPOSITION:  Home.     The patient was seen and examined by myself and presented to Dr. Daleen Squibb who saw   the patient and agrees with the assessment and plan.       *-*-*                                             _______________________________________   CM/nb                                  _____   D:  08/12/2010 12:13                  [place] Alyn Riedinger, P.A.   T:  08/12/2010 15:55   Job #:  [id number]                                         _______________________________________                                          _____                                         Cherly Anderson, M.D.   c:   Mearl Latin, M.D.                                EMERGENCY DEPARTMENT NOTE                                                                PAGE    1 of   1                                         Cherly Anderson, M.D.   c:   Mearl Latin, M.D.  EMERGENCY DEPARTMENT NOTE                                                                PAGE    1 of   1

## 2010-08-12 NOTE — Unmapped (Signed)
[facility]     PATIENT NAME:   [name], [name]                MR #:  [id number]   DATE OF BIRTH:  Oct 02, 1940                        ACCOUNT #:  [id number]   ED PHYSICIAN:   Cherly Anderson, M.D.                 ROOM #:   PRIMARY:        Mearl Latin, M.D.           NURSING UNIT:  ED   REFERRING:                                        FC:  M   DICTATED BY:    Cherly Anderson, M.D.                 ADMIT DATE:  08/12/2010   VISIT DATE:                                       DISCHARGE DATE:                               EMERGENCY DEPARTMENT NOTE     *-*-*     THIS IS AN ADDENDUM, 08/12/2010 TM.     The patient was initially seen and evaluated by emergency medicine physician   assistant.  I agree with the treatment plan and evaluation.  I have no   further addendums at this time.         *-*-*                                             _______________________________________   AW/tlm                                 _____   D:  08/12/2010 11:37                  Cherly Anderson, M.D.   T:  08/12/2010 15:41   Job #:  [id number]                                    EMERGENCY DEPARTMENT NOTE                                                                PAGE    1 of   1   Job #:  [id number]  EMERGENCY DEPARTMENT NOTE                                                                PAGE    1 of   1

## 2010-08-12 NOTE — ED Notes (Unsigned)
[facility]    PATIENT NAME:   [name], [name]                MR #:  [id number]  DATE OF BIRTH:  06-Nov-1940                        ACCOUNT #:  [id number]  ED PHYSICIAN:   Cherly AndersonArthur Wall, M.D.                 ROOM #:  PRIMARY:        Mearl LatinLawrence E. Neack, M.D.           NURSING UNIT:  ED  REFERRING:                                        FC:  M  DICTATED BY:    Cherly AndersonArthur Wall, M.D.                 ADMIT DATE:  08/12/2010  VISIT DATE:                                       DISCHARGE DATE:                              EMERGENCY DEPARTMENT NOTE    *-*-*    THIS IS AN ADDENDUM, 08/12/2010 TM.    The patient was initially seen and evaluated by emergency medicine physician  assistant.  I agree with the treatment plan and evaluation.  I have no  further addendums at this time.        *-*-*                                            _______________________________________  AW/tlm                                 _____  D:  08/12/2010 11:37                  Cherly AndersonArthur Wall, M.D.  T:  08/12/2010 15:41  Job #:  [id number]                                   EMERGENCY DEPARTMENT NOTE                                                               PAGE    1 of   1

## 2010-08-12 NOTE — ED Notes (Unsigned)
[facility]    PATIENT NAME:   Carl GlassJOHNSON, Kamil R.                MR #:  [id number]  DATE OF BIRTH:  May 06, 1940                        ACCOUNT #:  [id number]  ED PHYSICIAN:   Cherly AndersonArthur Wall, M.D.                 ROOM #:  PRIMARY:        Mearl LatinLawrence E. Neack, M.D.           NURSING UNIT:  ED  REFERRING:                                        FC:  M  DICTATED BY:    Cherly AndersonArthur Wall, M.D.                 ADMIT DATE:  08/12/2010  VISIT DATE:     08/12/2010                        DISCHARGE DATE:                              EMERGENCY DEPARTMENT NOTE    *-*-*    ***ADDENDUM - 08/12/10 - ljw***    The patient was initially seen and evaluated by emergency medicine physician  assistant, [place] Moser.  I have personally seen and examined the patient,  discussed treatment plan and evaluation with the emergency medicine physician  assistant.  I agree with his treatment plan and evaluation and have no  further addendums at this time.      *-*-*                                            _______________________________________  AW/ljw                                 _____  D:  08/12/2010 11:08                  Cherly AndersonArthur Wall, M.D.  T:  08/12/2010 15:21  Job #:  [id number]                                   EMERGENCY DEPARTMENT NOTE                                                               PAGE    1 of   1

## 2010-08-12 NOTE — ED Notes (Unsigned)
[facility]    PATIENT NAME:   Carl GlassJOHNSON, [name]                MR #:  [id number]  DATE OF BIRTH:  [birth year]                        ACCOUNT #:  [id number]  ED PHYSICIAN:   Cherly AndersonArthur Wall, M.D.                 ROOM #:  PRIMARY:        Mearl LatinLawrence E. Neack, M.D.           NURSING UNIT:  ED  REFERRING:                                        FC:  M  DICTATED BY:    [place] Moser, P.A.                  ADMIT DATE:  08/12/2010  VISIT DATE:                                       DISCHARGE DATE:                              EMERGENCY DEPARTMENT NOTE    *-*-*    CHIEF COMPLAINT:  Left wrist injury.    HISTORY OF PRESENT ILLNESS:  This is a 70 year old gentleman who presents  with 8/10 pain located in the left wrist after he was running and fell.  He  landed on an outstretched arm.  He presents with a throbbing, constant pain,  worse with waking up this morning as the injury happened yesterday, located  to the ulnar aspect of the left wrist.  Denies any radiation of the pain, any  numbness, tingling, weakness distally or any other symptoms at this time.  He  went to Urgent Care yesterday for right wrist pain, but the left wrist at  that time felt fine.    PAST MEDICAL HISTORY:    1.  GERD.  2.  Diabetes.  3.  Hypertension.  4.  Hypothyroidism.  5.  High cholesterol.    MEDICATIONS:  Per nursing notes.    ALLERGIES:  None.    SOCIAL HISTORY:  Negative for tobacco, alcohol or drugs.    REVIEW OF SYSTEMS:  As above and negative per patient.    PHYSICAL EXAMINATION:    VITAL SIGNS:  Upon arrival to the ED, blood pressure 108/67, pulse 61,  respirations 16, temperature 98, O2 sat 98%.  GENERAL:  Well-developed, in no distress.  SKIN:  Warm, dry to touch.  HEENT:  Normocephalic, atraumatic.  Pupils equal, round.  EXTREMITIES:  Tender to palpation to the ulnar styloid of the left wrist.  Otherwise, 5/5 strength, full range of motion.  No hand or scaphoid  tenderness.  NEUROLOGIC:  Neurovascularly  intact.    EMERGENCY DEPARTMENT  COURSE/IMPRESSION:  The patient was seen and examined  by myself and presented to Dr. Daleen SquibbWall.  Bilateral wrist x-rays showed no  evidence for fracture.    IMPRESSION:  A 70 year old gentleman with bilateral wrist sprains; however,  his right wrist was previously diagnosed at the Urgent  Care yesterday.  His  left wrist had delayed onset of pain.  No evidence of fracture on x-ray.  He  was given Tylenol #3 for pain.  Will have him also take over-the-counter  NSAIDs.  We gave him a Velcro wrist splint.    DIAGNOSIS:    1.  Left wrist sprain.    PLAN:    1.  RICE.  2.  NSAIDs.  3.  Continue Tylenol #3.  4.  Return for worsening symptoms.    DISPOSITION:  Home.    The patient was seen and examined by myself and presented to Dr. Daleen Squibb who saw  the patient and agrees with the assessment and plan.      *-*-*                                            _______________________________________  CM/nb                                  _____  D:  08/12/2010 12:13                  [place] Moser, P.A.  T:  08/12/2010 15:55  Job #:  [id number]                                        _______________________________________                                         _____                                        Cherly Anderson, M.D.  c:   Mearl Latin, M.D.                               EMERGENCY DEPARTMENT NOTE                                                               PAGE    1 of   1

## 2010-08-23 MED ORDER — VALSARTAN-HYDROCHLOROTHIAZIDE 320-25 MG PO TABS
320-25 MG | ORAL_TABLET | Freq: Every day | ORAL | Status: DC
Start: 2010-08-23 — End: 2010-11-19

## 2010-08-23 MED ORDER — LEVOTHYROXINE SODIUM 100 MCG PO TABS
100 MCG | ORAL_TABLET | Freq: Every day | ORAL | Status: DC
Start: 2010-08-23 — End: 2010-11-19

## 2010-08-23 MED ORDER — GLYBURIDE-METFORMIN 2.5-500 MG PO TABS
ORAL_TABLET | Freq: Two times a day (BID) | ORAL | Status: DC
Start: 2010-08-23 — End: 2010-10-21

## 2010-08-23 MED ORDER — CARVEDILOL 25 MG PO TABS
25 MG | ORAL_TABLET | Freq: Two times a day (BID) | ORAL | Status: DC
Start: 2010-08-23 — End: 2010-11-19

## 2010-08-23 MED ORDER — OMEPRAZOLE 40 MG PO CPDR
40 MG | ORAL_CAPSULE | Freq: Every day | ORAL | Status: DC
Start: 2010-08-23 — End: 2010-11-19

## 2010-08-23 NOTE — Telephone Encounter (Signed)
Last visit 06-09-2010

## 2010-10-21 NOTE — Assessment & Plan Note (Signed)
Check ck

## 2010-10-21 NOTE — Assessment & Plan Note (Signed)
Check lab  May be the cause of the ck elevation and aching, discussed

## 2010-10-21 NOTE — Assessment & Plan Note (Signed)
Fair control

## 2010-10-21 NOTE — Assessment & Plan Note (Signed)
stable °

## 2010-10-21 NOTE — Assessment & Plan Note (Signed)
Check lab

## 2010-10-21 NOTE — Progress Notes (Signed)
Subjective:      Patient ID: Carl Blair is a 70 y.o. male.    HPI    Fasting for blood work  Off the cholesterol medicine x 5 months now  Muscle aches got better about 4 weeks after starting the vitamin D  Discussed  Should start 2000 units a day if levels are good    Glucoses discussed  Checked this am 197, 150-200 "all the time"  Weight is up  Reduced the walking    Cough x one day, uri sx, feels better now, no fever/chills  No dyspnea    Review of Systems   Constitutional: Negative for fever, chills and appetite change.   HENT: Positive for congestion and rhinorrhea.    Respiratory:        See hpi   Cardiovascular: Negative for chest pain and leg swelling.   Gastrointestinal: Negative for abdominal pain, diarrhea and constipation.   Musculoskeletal: Negative for myalgias (much better) and arthralgias.   Neurological: Negative for syncope and headaches.   Hematological: Negative for adenopathy. Does not bruise/bleed easily.       Objective:   Physical Exam   Constitutional: He is oriented to person, place, and time. He appears well-developed and well-nourished.   Eyes: Pupils are equal, round, and reactive to light. No scleral icterus.   Neck: Normal range of motion. No JVD present.   Cardiovascular: Normal rate, regular rhythm and intact distal pulses.    Pulmonary/Chest: Effort normal and breath sounds normal.   Abdominal: Soft. Bowel sounds are normal.   Musculoskeletal: He exhibits no edema.   Neurological: He is alert and oriented to person, place, and time.   Psychiatric: He has a normal mood and affect. His behavior is normal. Judgment and thought content normal.       Assessment:      1. DM w/o Complication Type II  Comprehensive metabolic panel, Hemoglobin A1c   2. Other specified disorders of liver  Comprehensive metabolic panel   3. Unspecified hypothyroidism  T4, free, TSH   4. Unspecified essential hypertension  Comprehensive metabolic panel   5. Special screening for malignant neoplasm of  prostate  Psa screening   6. Other and unspecified hyperlipidemia  CK, Comprehensive metabolic panel, Lipid panel   7. Vitamin D deficiency  Vitamin D 25 hydroxy          Plan:      See progress note.

## 2010-10-21 NOTE — Assessment & Plan Note (Signed)
Checking again off medication this time

## 2010-10-21 NOTE — Assessment & Plan Note (Signed)
Check a1c, discussed

## 2010-10-21 NOTE — Patient Instructions (Signed)
Watch the blood pressure and glucoses.

## 2010-10-22 LAB — COMPREHENSIVE METABOLIC PANEL
ALT: 18 U/L (ref 10–40)
AST: 16 U/L (ref 15–37)
Albumin/Globulin Ratio: 1.8 (ref 1.1–2.2)
Albumin: 4.3 gm/dl (ref 3.4–5.0)
Alkaline Phosphatase: 105 U/L (ref 45–129)
BUN: 24 mg/dl — ABNORMAL HIGH (ref 7–18)
CO2: 28 mEq/L (ref 21–32)
Calcium: 10.5 mg/dl (ref 8.3–10.6)
Chloride: 103 mEq/L (ref 99–110)
Creatinine: 1.1 mg/dl (ref 0.8–1.3)
GFR Est, African/Amer: >60
GFR, Estimated: >60 (ref >60)
Glucose: 183 mg/dl — ABNORMAL HIGH (ref 70–99)
Potassium: 4.3 mEq/L (ref 3.5–5.1)
Sodium: 138 mEq/L (ref 136–145)
Total Bilirubin: 0.5 mg/dl (ref 0.0–1.0)
Total Protein: 6.7 gm/dl (ref 6.4–8.2)

## 2010-10-22 LAB — TSH
T4 Free: 1.36 ng/dl (ref 0.9–1.8)
TSH: 1.33 u[IU]/mL (ref 0.35–5.5)

## 2010-10-22 LAB — LIPID PANEL
Cholesterol, Total: 194 mg/dl (ref <200)
HDL: 33 mg/dl — ABNORMAL LOW (ref 40–60)
LDL Calculated: 119 mg/dl — ABNORMAL HIGH (ref <100)
Triglycerides: 207 mg/dl — ABNORMAL HIGH (ref <150)
VLDL Cholesterol Calculated: 41 mg/dl

## 2010-10-22 LAB — T4, FREE: T4 Free: 1.36 ng/dl (ref 0.9–1.8)

## 2010-10-22 LAB — HEMOGLOBIN A1C
Hemoglobin A1C: 7.3 — ABNORMAL HIGH (ref 4.0–6.0)
eAG: 162.8 mg/dl

## 2010-10-22 LAB — CK: Total CK: 123 U/L (ref 38–174)

## 2010-10-22 LAB — VITAMIN D 25 HYDROXY: Vit D, 25-Hydroxy: 54.4 ng/ml

## 2010-10-22 LAB — PSA SCREENING: PSA: 0.82 ng/ml

## 2010-10-25 NOTE — Telephone Encounter (Signed)
He has been on zocor  Before    He had body aches with the zocor.  He will go back on lipitor  He was on this also

## 2010-10-27 MED ORDER — ATORVASTATIN CALCIUM 10 MG PO TABS
10 MG | ORAL_TABLET | Freq: Every day | ORAL | Status: DC
Start: 2010-10-27 — End: 2010-10-28

## 2010-10-27 NOTE — Telephone Encounter (Signed)
lipitor entered

## 2010-10-27 NOTE — Telephone Encounter (Signed)
He has been on the zocor before.  He would rather go back on the lipitor.  He needs mail away.  You sent him a letter regarding

## 2010-10-28 MED ORDER — ATORVASTATIN CALCIUM 10 MG PO TABS
10 MG | ORAL_TABLET | Freq: Every day | ORAL | Status: DC
Start: 2010-10-28 — End: 2011-05-04

## 2010-11-19 MED ORDER — LEVOTHYROXINE SODIUM 100 MCG PO TABS
100 MCG | ORAL_TABLET | Freq: Every day | ORAL | Status: DC
Start: 2010-11-19 — End: 2011-05-04

## 2010-11-19 MED ORDER — CARVEDILOL 25 MG PO TABS
25 MG | ORAL_TABLET | Freq: Two times a day (BID) | ORAL | Status: DC
Start: 2010-11-19 — End: 2011-05-04

## 2010-11-19 MED ORDER — VALSARTAN-HYDROCHLOROTHIAZIDE 320-25 MG PO TABS
320-25 MG | ORAL_TABLET | Freq: Every day | ORAL | Status: DC
Start: 2010-11-19 — End: 2011-05-04

## 2010-11-19 MED ORDER — OMEPRAZOLE 40 MG PO CPDR
40 MG | ORAL_CAPSULE | Freq: Every day | ORAL | Status: DC
Start: 2010-11-19 — End: 2011-05-04

## 2010-11-19 NOTE — Telephone Encounter (Signed)
Last visit 10-21-2010

## 2010-11-30 MED ORDER — GLYBURIDE-METFORMIN 2.5-500 MG PO TABS
ORAL_TABLET | Freq: Two times a day (BID) | ORAL | Status: DC
Start: 2010-11-30 — End: 2011-05-04

## 2010-11-30 NOTE — Telephone Encounter (Signed)
LOV 10/21/10    L/R  11/02/09 Glucovance #180 x 1    No appt scheduled

## 2011-01-18 NOTE — Patient Instructions (Signed)
Please call if symptoms worsen or do not improve.      Consider seeing dentist about the ear pain.  If dental exam is normal, then see Dr Tobie Lords again.

## 2011-01-18 NOTE — Progress Notes (Signed)
Subjective:      Patient ID: Carl Blair is a 70 y.o. male.    HPI    Weight is down some    Glucoses usually still fasting around 180  Discussed    Fell in July, "pulled ligaments" in hand  Numb while sitting and watching tv   Works it, numbness goes away  Doesn't wake up with it numb but has to lie with it down at his side to keep it from going numb  All fingers involved  Left index occ triggers since injury    Right ear won't pop   Aches at times  X couple months  Decreased hearing  Happened right as he opened the fridge door, no trauma  No uri at times  No dizziness    Review of Systems   Constitutional: Negative for fever, chills, appetite change and unexpected weight change.   HENT: Positive for hearing loss and ear pain. Negative for postnasal drip and sinus pressure.         Had hearing test done this summer, some hearing loss   Respiratory: Negative for shortness of breath.    Cardiovascular: Negative for chest pain and leg swelling.   Gastrointestinal: Negative.    Musculoskeletal: Positive for arthralgias. Negative for myalgias.   Neurological: Positive for numbness. Negative for weakness.        See hpi   Hematological: Negative for adenopathy. Does not bruise/bleed easily.       Objective:   Physical Exam   Constitutional: He is oriented to person, place, and time. He appears well-developed and well-nourished.   HENT:   Head: Normocephalic and atraumatic.   Right Ear: External ear normal.   Left Ear: External ear normal.        tms negative  No cerumen impaction  Good motion to insufflation   Eyes: Pupils are equal, round, and reactive to light. No scleral icterus.   Cardiovascular: Normal rate and regular rhythm.    Musculoskeletal: Normal range of motion. He exhibits no edema.        Tender left dorsal wrist   Neurological: He is alert and oriented to person, place, and time. He has normal reflexes. No cranial nerve deficit.        Negative tinnel median and ulnar bilaterally  Good grip        Assessment:      1. Otalgia of right ear    2. Numbness and tingling in right hand    3. DM w/o Complication Type II           Plan:      See progress note.

## 2011-01-18 NOTE — Assessment & Plan Note (Signed)
rechedck a1c

## 2011-01-18 NOTE — Assessment & Plan Note (Signed)
emg right arm and hand  Fu prn

## 2011-01-18 NOTE — Assessment & Plan Note (Signed)
Normal exam  Discussed  Refer to ent

## 2011-01-20 LAB — COMPREHENSIVE METABOLIC PANEL
ALT: 19 U/L (ref 10–40)
AST: 19 U/L (ref 15–37)
Albumin/Globulin Ratio: 2 (ref 1.1–2.2)
Albumin: 4.4 gm/dl (ref 3.4–5.0)
Alkaline Phosphatase: 105 U/L (ref 45–129)
BUN: 23 mg/dl — ABNORMAL HIGH (ref 7–18)
CO2: 32 mEq/L (ref 21–32)
Calcium: 9.3 mg/dl (ref 8.3–10.6)
Chloride: 103 mEq/L (ref 99–110)
Creatinine: 1.2 mg/dl (ref 0.8–1.3)
GFR Est, African/Amer: >60
GFR, Estimated: >60 (ref >60)
Glucose: 186 mg/dl — ABNORMAL HIGH (ref 70–99)
Potassium: 4.7 mEq/L (ref 3.5–5.1)
Sodium: 141 mEq/L (ref 136–145)
Total Bilirubin: 0.2 mg/dl (ref 0.0–1.0)
Total Protein: 6.6 gm/dl (ref 6.4–8.2)

## 2011-01-20 LAB — LIPID PANEL
Cholesterol, Total: 154 mg/dl (ref <200)
HDL: 31 mg/dl — ABNORMAL LOW (ref 40–60)
LDL Calculated: 88 mg/dl (ref <100)
Triglycerides: 171 mg/dl — ABNORMAL HIGH (ref <150)
VLDL Cholesterol Calculated: 34 mg/dl

## 2011-01-20 LAB — CK: Total CK: 167 U/L (ref 38–174)

## 2011-01-20 LAB — HEMOGLOBIN A1C
Hemoglobin A1C: 7.4 — ABNORMAL HIGH (ref 4.0–6.0)
eAG: 165.7 mg/dl

## 2011-04-14 ENCOUNTER — Encounter: Attending: Medical

## 2011-04-14 ENCOUNTER — Ambulatory Visit: Admit: 2011-04-14 | Payer: MEDICARE | Attending: Medical

## 2011-04-14 DIAGNOSIS — G4733 Obstructive sleep apnea (adult) (pediatric): Secondary | ICD-10-CM

## 2011-04-14 NOTE — Unmapped (Deleted)
Pt last seen on 01/11/2010.  ESS = 14 WT:  256 LBS  _________________________________________________  04-14-11:  Annual f/up.  ESS =   Wt:    CONFIRM DME?????          Cherly Hensen, MA          Date of NPSG: 05/27/2003   SE %: 63.5   AHI: 52   Lowest p ox %: 73   NPSG performed at: [facility]       Date of CPAP: 12/06/2008   SE %: 76.5   AHI: 1.1   Lowest p ox %: 90.0   CPAP performed at: [facility]     DME Company: [employer]   Mask type: nasal   cmH2O: 13             Sleep Medicine Established Patient Visit   Assessment and Plan   OSAHS: one year follow up using CPAP therapy nightly (AHI 52/hr after UP3, 146/hr prior to surgery). Epworth continues to reflect excessive daytime sleepiness (ESS 14). Sleep hx: in bed at midnight - 1 AM, waking up twice to void, out of bed by 8 AM. Wakes up refreshed. No Ha's. EDS continues to be associated with boredom, and never falls asleep when watching something interesting. PMH: HTN, BP elevated today, but home logs are with diastolic < 80, DM with A1c 6.2 %. atrial fib (corrected with minimaze), asx GERD, dylipidemia.   Educated re: OSAHS pathophysiology, relationship with cardiovascular morbidity and mortality, issues related to CPAP adherence, its use, cleaning and maintenance. Patient verbalized understanding and agrees to comply. Patient was educated regarding avoiding driving while sleepy, specifically avoiding long distance or night driving pending successful treatment of this condition.   RTC annually.   Assessment   Problem List Updates for today's visit   1. Assessed OBSTRUCTIVE SLEEP APNEA (ICD-327.23) as stable   Plan   Orders for today's visit   1. Ordered [phone] - Patient Encounter Documented Using an EHR Certified by [facility] [*CPT-G8447]   2. Ordered [phone] - Current Medications with Name, Dosages, Frequency and Route Documented [*[id number]]   3. Ordered 3016F - Unhealthy Alcohol Use Screening Performed [*CPT-3016F]   4. Ordered Benetta Spar Surdulescu MD [[id number]]   5.  Ordered 99213 - Ofc Vst, Est Level III [id number]   Disposition:   Return to clinic for PA Visit in 1 year(s)   Appointment Reason: OSAHS   CC:   History of Present Illness   osa   Description of Sleep Problem: Wake up during the night, Wake up early in the morning   Treatments Received: CPAP   Has sleep quality improved? Yes   Snoring? No   Do you wake up refreshed? Yes   PAST HISTORY   Past Medical History (reviewed - no changes required): Atrial Fibrillation, Hyperlipidemia, Hypertension, Dilated Cardiomyopathy, Diabetes Type II, Hypothyroidism, sleep apnea   Surgical History (reviewed - no changes required): Septoplasty: ., UPPP, Hernia Surgery: right inguinal, mini maze 9-09, 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 sleep apnea surgery, has had esophageal dilations in the past (Dr. Karolee Stamps), No problems with anesthesia, No problems with healing, No problems with blood clots after surgery   Family History (reviewed - no changes required): Mother - Deceased - dm2, htn   Father - Deceased   Daughter - 0   Son - 0   Sister - dm2, cad - 2 sisters (1 is deceased)   Brother - dm2, cad, htn - 2 brothers   Social History (reviewed -  no changes required): Preferred Language: English, Marital Status: married, Employment Status: retired, Patient Lives at: home, Support System: excellent   Exercise: not due to foot, Caffeine per Day: 2   Seatbelt Use: 100 % of the time   Alcohol Use: none   Drug Use: none   Tobacco Usage:non-smoker   Review of Systems   See scanned review of systems.   Intake Comments: annaual office visit; Last office visit-01/20/2009; ESS=12

## 2011-04-14 NOTE — Unmapped (Signed)
Sleep Medicine Established Patient Visit     Assessment and Plan     OSAHS: one year follow up using CPAP therapy nightly (AHI 52/hr after UP3, 146/hr prior to surgery). Epworth continues to reflect excessive daytime sleepiness (ESS 12). Sleep hx: in bed at midnight - 1 AM, waking up twice to void, out of bed by 8 AM. Wakes up refreshed. No Ha's. EDS continues to be associated with boredom, and never falls asleep when watching something interesting.     PMH: HTN, BP elevated today, but home logs are with diastolic < 80, DM with A1c 6.2 %. atrial fib (corrected with minimaze), asx GERD, dylipidemia.     ROS: neg dyspnea, PND, orthopnea, or nocturnal cough. DM A1c is in the 7's, 20 lbs wgt gain past quarter.    PE: HEENT: Mall IV, CV RRR w/o murmur, Lungs BL clear to A, BL LE's w/ 2 plus edema left leg, 1 plus right leg.    Educated re: OSAHS pathophysiology, relationship with cardiovascular morbidity and mortality, issues related to CPAP adherence, its use, cleaning and maintenance. Patient verbalized understanding and agrees to comply. Patient was educated regarding avoiding driving while sleepy, specifically avoiding long distance or night driving pending successful treatment of this condition.     RTC annually. Recommend weight loss through therapeutic lifestyle changes. Seek PCP opinion on LE edema.      __________________________________________________________________________    [name] is DME.  Pressure is blowing at 13 cm.  Pt has no c/o other than wt gain.  Pt last seen on 01/11/2010.  ESS = 14 WT:  256 LBS    04-14-11:  Annual f/up.  ESS =  12 Wt:  277.2 lbs    [name], [place]  04/14/2011    Date of NPSG: 05/27/2003   SE %: 63.5   AHI: 52   Lowest p ox %: 73   NPSG performed at: [facility]     Date of CPAP: 12/06/2008   SE %: 76.5   AHI: 1.1   Lowest p ox %: 90.0   CPAP performed at: [facility] @ time of study:  265 lbs  DME Company: [employer]  Mask type: full face  cmH2O: 13

## 2011-05-04 LAB — CK: Total CK: 110 U/L (ref 38–174)

## 2011-05-04 LAB — COMPREHENSIVE METABOLIC PANEL
ALT: 23 U/L (ref 10–40)
AST: 20 U/L (ref 15–37)
Albumin/Globulin Ratio: 1.7 (ref 1.1–2.2)
Albumin: 4.3 gm/dl (ref 3.4–5.0)
Alkaline Phosphatase: 99 U/L (ref 45–129)
BUN: 20 mg/dl — ABNORMAL HIGH (ref 7–18)
CO2: 33 mEq/L — ABNORMAL HIGH (ref 21–32)
Calcium: 9.4 mg/dl (ref 8.3–10.6)
Chloride: 100 mEq/L (ref 99–110)
Creatinine: 1.2 mg/dl (ref 0.8–1.3)
GFR Est, African/Amer: >60
GFR, Estimated: >60 (ref >60)
Glucose: 188 mg/dl — ABNORMAL HIGH (ref 70–99)
Potassium: 4.7 mEq/L (ref 3.5–5.1)
Sodium: 138 mEq/L (ref 136–145)
Total Bilirubin: 0.5 mg/dl (ref 0.0–1.0)
Total Protein: 6.8 gm/dl (ref 6.4–8.2)

## 2011-05-04 LAB — HEMOGLOBIN A1C
Hemoglobin A1C: 8.3
eAG: 191.5 mg/dl

## 2011-05-04 LAB — LIPID PANEL
Cholesterol, Total: 148 mg/dl (ref <200)
HDL: 29 mg/dl — ABNORMAL LOW (ref 40–60)
LDL Calculated: 87 mg/dl (ref <100)
Triglycerides: 160 mg/dl — ABNORMAL HIGH (ref <150)
VLDL Cholesterol Calculated: 32 mg/dl

## 2011-05-04 LAB — LACTIC ACID: Lactate: 0.9 mmol/L (ref 0.4–2.0)

## 2011-05-04 LAB — MICROALBUMIN / CREATININE URINE RATIO
Creatinine, Ur: 147 mg/dl
Microalbumin Creatinine Ratio: 4.1 (ref 0.0–30.0)
Microalbumin, Random Urine: 0.6 mg/dl (ref 0.0–1.8)

## 2011-05-04 MED ORDER — LEVOTHYROXINE SODIUM 100 MCG PO TABS
100 MCG | ORAL_TABLET | Freq: Every day | ORAL | Status: DC
Start: 2011-05-04 — End: 2011-11-09

## 2011-05-04 MED ORDER — VALSARTAN-HYDROCHLOROTHIAZIDE 320-25 MG PO TABS
320-25 MG | ORAL_TABLET | Freq: Every day | ORAL | Status: DC
Start: 2011-05-04 — End: 2011-11-15

## 2011-05-04 MED ORDER — OMEPRAZOLE 40 MG PO CPDR
40 MG | ORAL_CAPSULE | Freq: Every day | ORAL | Status: DC
Start: 2011-05-04 — End: 2011-11-09

## 2011-05-04 MED ORDER — CARVEDILOL 25 MG PO TABS
25 MG | ORAL_TABLET | Freq: Two times a day (BID) | ORAL | Status: DC
Start: 2011-05-04 — End: 2011-11-15

## 2011-05-04 MED ORDER — GLYBURIDE-METFORMIN 2.5-500 MG PO TABS
ORAL_TABLET | Freq: Two times a day (BID) | ORAL | Status: DC
Start: 2011-05-04 — End: 2011-11-09

## 2011-05-04 MED ORDER — ATORVASTATIN CALCIUM 10 MG PO TABS
10 MG | ORAL_TABLET | Freq: Every day | ORAL | Status: DC
Start: 2011-05-04 — End: 2011-11-25

## 2011-05-04 NOTE — Assessment & Plan Note (Signed)
Stable  Check labs  meds discussed  Acceptable condition for planned surgery

## 2011-05-04 NOTE — Patient Instructions (Signed)
Stop all aspirin, ibuprofen, aleve, or any other arthritis pill (Tylenol/acetaminophen is   OK) for seven days prior to the procedure.     Eat and drink nothing after midnight the night before the planned procedure except for the Diovan HCT and the Carvedilol with just a sip of water on the morning of the procedure.

## 2011-05-04 NOTE — Assessment & Plan Note (Signed)
Check labs  Refill med

## 2011-05-04 NOTE — Assessment & Plan Note (Signed)
Planned correction left hand  ASA 2 risk for surgery, compensated chf

## 2011-05-04 NOTE — Assessment & Plan Note (Signed)
Check ekg  Asa and surgery discussed

## 2011-05-04 NOTE — Assessment & Plan Note (Signed)
recheck

## 2011-05-04 NOTE — Progress Notes (Signed)
Subjective:      Patient ID: Carl Blair is a 71 y.o. male.    HPI    Planned left hand Carpal Tunnel repair, has CTS demonstrated both hands, left is worse    Had stomach virus reported one month ago, resolved  Glucoses were up prior to that, increased metformin on own, glucoses are started down now  No myalgias/diarrhea/side effects noted from increase      Current Outpatient Prescriptions   Medication Sig Dispense Refill   . Cholecalciferol (VITAMIN D) 2000 UNITS CAPS capsule Take 1 capsule by mouth daily.         Marland Kitchen glyBURIDE-metformin (GLUCOVANCE) 2.5-500 MG per tablet Take 1 tablet by mouth 2 times daily (with meals).  180 tablet  1   . omeprazole (PRILOSEC) 40 MG capsule Take 1 capsule by mouth daily.  90 capsule  1   . carvedilol (COREG) 25 MG tablet Take 1 tablet by mouth 2 times daily.  180 tablet  1   . levothyroxine (SYNTHROID) 100 MCG tablet Take 1 tablet by mouth daily.  90 tablet  1   . valsartan-hydrochlorothiazide (DIOVAN HCT) 320-25 MG per tablet Take 1 tablet by mouth daily.  90 tablet  1   . atorvastatin (LIPITOR) 10 MG tablet Take 1 tablet by mouth daily.  90 tablet  1   . SAW PALMETTO Take  by mouth daily. Indications: XRT       . Glucosamine Sulfate POWD        . aspirin 81 MG EC tablet          No current facility-administered medications for this visit.      Allergies:  No Known Allergies  Prior Medical History:       Diagnosis Date   . Atrial fibrillation    . Hyperlipidemia    . Hypertension    . Type II or unspecified type diabetes mellitus without mention of complication, not stated as uncontrolled    . Hypothyroidism    . Unspecified sleep apnea    . Cardiomyopathy      Social History  History   Substance Use Topics   . Smoking status: Never Smoker    . Smokeless tobacco: Never Used   . Alcohol Use: Yes      rare     Prior Surgical History:      Procedure Laterality Date   . Hernia repair       Right   . Uvulopalatopharygoplasty     . Septoplasty     . Cardiac surgery  2009     mini  maze     Family History:       Problem Relation Age of Onset   . Diabetes Mother    . Hypertension Mother    . Diabetes Sister    . Coronary Art Dis Sister    . Diabetes Brother    . Coronary Art Dis Brother    . Hypertension Brother            Review of Systems   Constitutional: Negative for fever and chills.   Respiratory: Positive for shortness of breath (sometimes, can walk 4 miles on flat without problem). Negative for wheezing.    Cardiovascular: Positive for leg swelling (sometimes, not daily). Negative for chest pain and palpitations.   Gastrointestinal: Negative for nausea, vomiting, abdominal pain, diarrhea and constipation.   Genitourinary: Negative for dysuria and hematuria.   Musculoskeletal: Positive for arthralgias. Negative for myalgias.  Neurological: Positive for weakness (some loss grip strength left hand) and numbness (hands). Negative for tremors.   Hematological: Negative for adenopathy. Does not bruise/bleed easily.       Objective:   Physical Exam   Constitutional: He is oriented to person, place, and time. He appears well-developed and well-nourished.   obese   Eyes: Pupils are equal, round, and reactive to light. No scleral icterus.   Cardiovascular: Normal rate, regular rhythm, normal heart sounds and intact distal pulses.    Pulmonary/Chest: Effort normal and breath sounds normal.   Abdominal: Soft. Bowel sounds are normal.   Musculoskeletal: He exhibits no edema.   Feet -- no sores seen  Normal pulses  Filament test normal bilaterally     Neurological: He is alert and oriented to person, place, and time.   Psychiatric: He has a normal mood and affect. His behavior is normal. Judgment and thought content normal.       Assessment:      1. Carpal tunnel syndrome     2. Atrial fibrillation  EKG 12 Lead, PR ELECTROCARDIOGRAM, COMPLETE   3. Congestive heart failure, unspecified  Lactic acid, plasma, PR ELECTROCARDIOGRAM, COMPLETE   4. Other and unspecified hyperlipidemia  Comprehensive  metabolic panel, Lipid panel, EKG 12 Lead, CK, PR ELECTROCARDIOGRAM, COMPLETE   5. Unspecified essential hypertension  EKG 12 Lead, PR ELECTROCARDIOGRAM, COMPLETE   6. DM w/o Complication Type II  HM DIABETES FOOT EXAM, Hemoglobin A1c, Comprehensive metabolic panel, Microalbumin / creatinine urine ratio, Lactic acid, plasma, PR ELECTROCARDIOGRAM, COMPLETE   7. Elevated CK  CK           Plan:      See progress note.

## 2011-05-04 NOTE — Assessment & Plan Note (Signed)
Changed metformin to 1000 mg bid  Check lab  Check lactic acid

## 2011-07-19 MED ORDER — SITAGLIPTIN PHOSPHATE 100 MG PO TABS
100 MG | ORAL_TABLET | Freq: Every day | ORAL | Status: DC
Start: 2011-07-19 — End: 2011-08-15

## 2011-07-19 NOTE — Assessment & Plan Note (Signed)
Poor control  Add [place]  Options discussed including insulin  Fu 6 weeks and prn

## 2011-07-19 NOTE — Progress Notes (Signed)
Subjective:      Patient ID: Carl Blair is a 71 y.o. male.    HPI    Joined gym, walking last 10 days     Eating different last two weeks    Glucoses at home discussed, 185 last night 2 hr pp, 269 this am fasting    Still on same medication        Review of Systems   Constitutional: Negative for fever, chills, appetite change and unexpected weight change.   Respiratory: Negative for chest tightness and shortness of breath.    Cardiovascular: Negative for chest pain and leg swelling.   Gastrointestinal: Negative for abdominal pain, diarrhea and constipation.   Musculoskeletal: Positive for arthralgias. Negative for myalgias.   Hematological: Negative for adenopathy. Does not bruise/bleed easily.       Objective:   Physical Exam   Constitutional: He is oriented to person, place, and time. He appears well-developed and well-nourished.   obese   Eyes: Pupils are equal, round, and reactive to light. No scleral icterus.   Cardiovascular: Normal rate, regular rhythm, normal heart sounds and intact distal pulses.    Pulmonary/Chest: Effort normal and breath sounds normal.   Abdominal: Soft. Bowel sounds are normal.   Musculoskeletal: He exhibits no edema.   Feet -- no sores seen  Normal pulses  Filament test normal bilaterally     Neurological: He is alert and oriented to person, place, and time.   Psychiatric: He has a normal mood and affect. His behavior is normal. Judgment and thought content normal.       Assessment:      1. Type II or unspecified type diabetes mellitus without mention of complication, not stated as uncontrolled  HM DIABETES FOOT EXAM   2. Morbid obesity     3. Numbness and tingling in right hand              Plan:      See progress note.

## 2011-07-19 NOTE — Assessment & Plan Note (Signed)
Pain following procedure still   Wearing splint at night

## 2011-07-19 NOTE — Patient Instructions (Signed)
Call for problems.

## 2011-07-19 NOTE — Assessment & Plan Note (Signed)
Starting diet and exercise  Recheck  Consider other options

## 2011-07-19 NOTE — Progress Notes (Signed)
BP Readings from Last 2 Encounters:   05/04/11 140/90   01/18/11 122/80     A1c (no units)   Date Value   05/04/2010 6.8*        Hemoglobin A1C (no units)   Date Value   05/04/2011 8.3         MICROALBUMIN, RANDOM URINE (mg/dl)   Date Value   9/52/8413 0.60         LDL Calculated (mg/dl)   Date Value   2/44/0102 87               Tobacco use:  Patient  reports that he has never smoked. He has never used smokeless tobacco.    If Smoker - Cessation materials given? No    Is Daily aspirin on medication list?:  Yes    Diabetic retinal exam done? Yes   If yes, document in Health Maintenance.     Monofilament placed on counter? Yes    Shoes and socks removed? Yes    BP taken with correct size cuff? Yes   Repeated if > 130/80 No     Microalbumin performed if applicable?  Yes

## 2011-08-15 LAB — COMPREHENSIVE METABOLIC PANEL
ALT: 20 U/L (ref 10–40)
AST: 19 U/L (ref 15–37)
Albumin/Globulin Ratio: 1.8 (ref 1.1–2.2)
Albumin: 4.4 gm/dl (ref 3.4–5.0)
Alkaline Phosphatase: 93 U/L (ref 45–129)
BUN: 21 mg/dl — ABNORMAL HIGH (ref 7–18)
CO2: 31 mEq/L (ref 21–32)
Calcium: 9.6 mg/dl (ref 8.3–10.6)
Chloride: 100 mEq/L (ref 99–110)
Creatinine: 1.2 mg/dl (ref 0.8–1.3)
GFR Est, African/Amer: >60
GFR, Estimated: >60 (ref >60)
Glucose: 151 mg/dl — ABNORMAL HIGH (ref 70–99)
Potassium: 4.6 mEq/L (ref 3.5–5.1)
Sodium: 140 mEq/L (ref 136–145)
Total Bilirubin: 0.6 mg/dl (ref 0.0–1.0)
Total Protein: 6.9 gm/dl (ref 6.4–8.2)

## 2011-08-15 MED ORDER — SITAGLIPTIN PHOSPHATE 100 MG PO TABS
100 MG | ORAL_TABLET | Freq: Every day | ORAL | Status: DC
Start: 2011-08-15 — End: 2012-03-12

## 2011-08-15 NOTE — Progress Notes (Signed)
BP Readings from Last 2 Encounters:   07/19/11 128/82   05/04/11 140/90     A1c (no units)   Date Value   05/04/2010 6.8*        Hemoglobin A1C (no units)   Date Value   05/04/2011 8.3         MICROALBUMIN, RANDOM URINE (mg/dl)   Date Value   6/57/8469 0.60         LDL Calculated (mg/dl)   Date Value   10/08/5282 87               Tobacco use:  Patient  reports that he has never smoked. He has never used smokeless tobacco.    If Smoker - Cessation materials given? No    Is Daily aspirin on medication list?:  Yes    Diabetic retinal exam done? Yes   If yes, document in Health Maintenance.     Monofilament placed on counter? Yes    Shoes and socks removed? Yes    BP taken with correct size cuff? Yes   Repeated if > 130/80 No     Microalbumin performed if applicable?  Yes

## 2011-08-15 NOTE — Patient Instructions (Addendum)
Patient Self-Management Goal for Chronic Condition  Goal: I will take all medications as prescribed by my doctor, and I will call the office if I am having any medication problems.  Barriers to success: none  Plan for overcoming my barriers: N/A     Confidence: 10/10  Date goal set: 08/15/2011  Date goal attained:     Carl Blair received counseling on the following healthy behaviors: diabetes    Patient given educational materials on Diabetes    I have instructed Carl Blair to complete a self tracking handout on Blood Sugars  and instructed them to bring it with them to his next appointment.     Discussed use, benefit, and side effects of prescribed medications.  Barriers to medication compliance addressed.  All patient questions answered.  Pt voiced understanding.

## 2011-08-15 NOTE — Assessment & Plan Note (Signed)
Doing well  rx for januvia to mail off  Check cmp  Fu 2-3 m and prn

## 2011-08-15 NOTE — Progress Notes (Signed)
Subjective:      Patient ID: Carl Blair is a 71 y.o. male.    HPI    Didn't bring records  Glucoses now 120-150 am or 2 hr pp in pm  Usually better in afternoon  Feels "not too good" due to current uri but otherwise ok  No fever/chills/  No dyspnea  Weight loss discussed, watching diet more  Going to gym with wife and exercising more    Review of Systems   Constitutional: Negative for fever, chills and unexpected weight change.        No hypoglycemia   Respiratory: Negative for chest tightness and shortness of breath.    Cardiovascular: Negative for chest pain and leg swelling.   Gastrointestinal: Negative for abdominal pain, diarrhea and constipation.   Hematological: Negative for adenopathy. Does not bruise/bleed easily.       Objective:   Physical Exam   Constitutional: He appears well-developed and well-nourished.   Eyes: Pupils are equal, round, and reactive to light. No scleral icterus.   Cardiovascular: Normal rate.    irr irr   Pulmonary/Chest: Effort normal and breath sounds normal.   Abdominal: Soft. Bowel sounds are normal.   Musculoskeletal: He exhibits no edema.       Assessment:      1. Diabetes mellitus type II            Plan:      See progress note.

## 2011-08-18 MED ORDER — AMOXICILLIN 500 MG PO CAPS
500 MG | ORAL_CAPSULE | Freq: Three times a day (TID) | ORAL | Status: AC
Start: 2011-08-18 — End: 2011-08-28

## 2011-08-18 NOTE — Telephone Encounter (Signed)
amox sent to [facility].  If no better/worse, needs to be seen.

## 2011-08-18 NOTE — Telephone Encounter (Signed)
Pt c/o cough with white phlegm, runny nose, stuffy nose, watery eyes, PND, sinus pressure X's 5 days. Pt was seen on Monday 08-15-11, pt wants to know if DR can call soemthing into [facility] on [address], call pt with questions at [phone].

## 2011-10-10 MED ORDER — BLOOD GLUCOSE TEST VI STRP
ORAL_STRIP | Status: DC
Start: 2011-10-10 — End: 2011-11-08

## 2011-10-10 MED ORDER — BLOOD GLUCOSE MONITORING SUPPL DEVI
Status: AC
Start: 2011-10-10 — End: ?

## 2011-10-10 MED ORDER — BLOOD GLUCOSE MONITORING SUPPL DEVI
Status: DC
Start: 2011-10-10 — End: 2011-10-10

## 2011-10-10 NOTE — Telephone Encounter (Signed)
[facility] calling and patient needs refill for accucheck aviva test strips and lansets. He would also like a new meter and pharmacy needs a script.  cb [phone]

## 2011-10-10 NOTE — Telephone Encounter (Signed)
Last visit 08-16-2011

## 2011-10-10 NOTE — Telephone Encounter (Signed)
entered

## 2011-11-04 NOTE — Telephone Encounter (Signed)
He is due for follow up in the next two weeks. The desired prescriptions can be written then.

## 2011-11-04 NOTE — Telephone Encounter (Signed)
Last visit 08-15-2011

## 2011-11-08 MED ORDER — BLOOD GLUCOSE TEST VI STRP
ORAL_STRIP | Status: DC
Start: 2011-11-08 — End: 2011-11-23

## 2011-11-09 MED ORDER — LEVOTHYROXINE SODIUM 100 MCG PO TABS
100 MCG | ORAL_TABLET | Freq: Every day | ORAL | Status: DC
Start: 2011-11-09 — End: 2012-05-28

## 2011-11-09 MED ORDER — OMEPRAZOLE 40 MG PO CPDR
40 MG | ORAL_CAPSULE | Freq: Every day | ORAL | Status: DC
Start: 2011-11-09 — End: 2012-05-28

## 2011-11-09 MED ORDER — GLYBURIDE-METFORMIN 2.5-500 MG PO TABS
ORAL_TABLET | Freq: Two times a day (BID) | ORAL | Status: DC
Start: 2011-11-09 — End: 2012-05-28

## 2011-11-09 NOTE — Telephone Encounter (Signed)
Last visit 08-15-2011

## 2011-11-15 MED ORDER — VALSARTAN-HYDROCHLOROTHIAZIDE 320-25 MG PO TABS
320-25 MG | ORAL_TABLET | Freq: Every day | ORAL | Status: DC
Start: 2011-11-15 — End: 2012-05-28

## 2011-11-15 MED ORDER — CARVEDILOL 25 MG PO TABS
25 MG | ORAL_TABLET | Freq: Two times a day (BID) | ORAL | Status: DC
Start: 2011-11-15 — End: 2012-05-28

## 2011-11-15 NOTE — Telephone Encounter (Signed)
Patient called and said that he needs a refill on medication omeprazole, Diovan, glyburide, and carvedilol. Pharmacy: [facility]. Patient call back number: [phone].

## 2011-11-15 NOTE — Telephone Encounter (Signed)
Last visit 08-15-2011  The other medication was filled 11-09-2011

## 2011-11-23 MED ORDER — BLOOD GLUCOSE TEST VI STRP
ORAL_STRIP | Status: DC
Start: 2011-11-23 — End: 2013-03-14

## 2011-11-25 MED ORDER — ATORVASTATIN CALCIUM 10 MG PO TABS
10 MG | ORAL_TABLET | Freq: Every day | ORAL | Status: DC
Start: 2011-11-25 — End: 2012-03-12

## 2011-11-25 NOTE — Patient Instructions (Addendum)
Patient Self-Management Goal for Chronic Condition  Goal: I will take all medications as prescribed by my doctor, and I will call the office if I am having any medication problems.  Barriers to success: none  Plan for overcoming my barriers: N/A     Confidence: 10/10  Date goal set: 11/25/2011  Date goal attained:

## 2011-11-25 NOTE — Progress Notes (Signed)
Subjective:      Patient ID: Carl Blair is a 71 y.o. male.    HPI    Fingers 3 and 4 numb all the time left hand even after the carpal tunnel  Has seen the surgeon back, discussed    Glucoses reviewed and discussed  Fasting 106-214  2 hr pp discussed, most are under 180    Weights reviewed    Exercise discussed, walking daily now  Belt not looser, etc    Blood pressure discussed    Review of Systems   Constitutional: Negative for fever, chills, activity change and unexpected weight change.   Respiratory: Negative for cough, shortness of breath and wheezing.    Cardiovascular: Negative for chest pain and leg swelling.   Gastrointestinal: Negative for abdominal pain, diarrhea and constipation.   Musculoskeletal: Positive for arthralgias. Negative for myalgias.   Neurological: Positive for numbness. Negative for weakness.   Hematological: Negative for adenopathy. Does not bruise/bleed easily.       Objective:   Physical Exam   Constitutional: He is oriented to person, place, and time. He appears well-developed and well-nourished.   Eyes: Pupils are equal, round, and reactive to light. No scleral icterus.   Neck: Normal range of motion. No JVD present.   Cardiovascular: Normal rate, regular rhythm and intact distal pulses.    Pulmonary/Chest: Effort normal and breath sounds normal.   Abdominal: Soft. Bowel sounds are normal.   Musculoskeletal: He exhibits no edema.   Neurological: He is alert and oriented to person, place, and time.   Psychiatric: He has a normal mood and affect. His behavior is normal. Judgment and thought content normal.       Assessment:      1. Diabetes mellitus type II  Hemoglobin A1c, Comprehensive metabolic panel   2. Unspecified hypothyroidism  TSH, T4, free   3. Special screening for malignant neoplasm of prostate  Psa screening   4. Unspecified essential hypertension  Comprehensive metabolic panel   5. Other and unspecified hyperlipidemia  Lipid panel, Comprehensive metabolic panel, CK            Plan:      See progress note.    Javonni received counseling on the following healthy behaviors: nutrition    Patient given educational materials on Diabetes    I have instructed Errik to complete a self tracking handout on Blood Sugars  and instructed them to bring it with them to his next appointment.     Discussed use, benefit, and side effects of prescribed medications.  Barriers to medication compliance addressed.  All patient questions answered.  Pt voiced understanding.

## 2011-11-25 NOTE — Progress Notes (Signed)
BP Readings from Last 2 Encounters:   08/15/11 120/78   07/19/11 128/82     A1c (no units)   Date Value   05/04/2010 6.8*        Hemoglobin A1C (no units)   Date Value   05/04/2011 8.3         MICROALBUMIN, RANDOM URINE (mg/dl)   Date Value   1/61/0960 0.60         LDL Calculated (mg/dl)   Date Value   4/54/0981 87               Tobacco use:  Patient  reports that he has never smoked. He has never used smokeless tobacco.    If Smoker - Cessation materials given? No    Is Daily aspirin on medication list?:  Yes    Diabetic retinal exam done? Yes   If yes, document in Health Maintenance.     Monofilament placed on counter? Yes    Shoes and socks removed? Yes    BP taken with correct size cuff? Yes   Repeated if > 130/80 No     Microalbumin performed if applicable?  Yes

## 2011-11-26 LAB — COMPREHENSIVE METABOLIC PANEL
ALT: 28 U/L (ref 10–40)
AST: 25 U/L (ref 15–37)
Albumin/Globulin Ratio: 1.7 (ref 1.1–2.2)
Albumin: 4.3 g/dL (ref 3.4–5.0)
Alkaline Phosphatase: 103 U/L (ref 45–129)
BUN: 26 mg/dL — ABNORMAL HIGH (ref 7–18)
CO2: 32 mEq/L (ref 21–32)
Calcium: 9.3 mg/dL (ref 8.3–10.6)
Chloride: 105 mEq/L (ref 99–110)
Creatinine: 1.1 mg/dL (ref 0.8–1.3)
GFR African American: >60 (ref >60)
GFR Non-African American: >60 (ref >60)
Globulin: 3 g/dL
Glucose: 150 mg/dL — ABNORMAL HIGH (ref 70–99)
Potassium: 4.6 mEq/L (ref 3.5–5.1)
Sodium: 141 mEq/L (ref 136–145)
Total Bilirubin: 0.4 mg/dL (ref 0.00–1.00)
Total Protein: 6.9 g/dL (ref 6.4–8.2)

## 2011-11-26 LAB — LIPID PANEL
Cholesterol, Total: 158 mg/dL (ref 0–199)
HDL: 33 mg/dL — ABNORMAL LOW (ref 40–60)
LDL Calculated: 103 mg/dL — ABNORMAL HIGH (ref 0–99)
Triglycerides: 110 mg/dL (ref 0–149)
VLDL Cholesterol Calculated: 22 mg/dL

## 2011-11-26 LAB — HEMOGLOBIN A1C
Hemoglobin A1C: 7 %
eAG: 154.2 mg/dL

## 2011-11-26 LAB — CK: Total CK: 267 U/L — ABNORMAL HIGH (ref 38–174)

## 2011-11-26 LAB — T4, FREE: T4 Free: 1.3 ng/dL (ref 0.9–1.8)

## 2011-11-26 LAB — TSH: TSH: 1.12 u[IU]/mL (ref 0.35–5.50)

## 2011-11-26 LAB — PSA SCREENING: PSA: 0.67 ng/mL (ref 0.00–4.00)

## 2012-03-12 LAB — COMPREHENSIVE METABOLIC PANEL
ALT: 25 U/L (ref 10–40)
AST: 32 U/L (ref 15–37)
Albumin/Globulin Ratio: 1.8 (ref 1.1–2.2)
Albumin: 4.3 g/dL (ref 3.4–5.0)
Alkaline Phosphatase: 83 U/L (ref 40–129)
BUN: 25 mg/dL — ABNORMAL HIGH (ref 7–20)
CO2: 26 mmol/L (ref 21–32)
Calcium: 9.4 mg/dL (ref 8.3–10.6)
Chloride: 102 mmol/L (ref 99–110)
Creatinine: 1.2 mg/dL (ref 0.8–1.3)
GFR African American: >60 (ref >60)
GFR Non-African American: >60 (ref >60)
Globulin: 2 g/dL
Glucose: 142 mg/dL — ABNORMAL HIGH (ref 70–99)
Potassium: 4.3 mmol/L (ref 3.5–5.1)
Sodium: 140 mmol/L (ref 136–145)
Total Bilirubin: 0.3 mg/dL (ref 0.0–1.0)
Total Protein: 6.7 g/dL (ref 6.4–8.2)

## 2012-03-12 LAB — LIPID PANEL
Cholesterol, Total: 137 mg/dL (ref 0–199)
HDL: 29 mg/dL — ABNORMAL LOW (ref 40–60)
LDL Calculated: 89 mg/dL (ref <100)
Triglycerides: 93 mg/dL (ref 0–150)
VLDL Cholesterol Calculated: 19 mg/dL

## 2012-03-12 LAB — TSH: TSH: 1.73 u[IU]/mL (ref 0.27–4.20)

## 2012-03-12 LAB — MICROALBUMIN / CREATININE URINE RATIO
Creatinine, Ur: 179.8 mg/dL (ref 39.0–259.0)
Microalbumin, Random Urine: <1.2 mg/dL (ref <2.0)

## 2012-03-12 LAB — T4, FREE: T4 Free: 1.4 ng/ml (ref 0.9–1.8)

## 2012-03-12 LAB — CK: Total CK: 1419 U/L — ABNORMAL HIGH (ref 39–308)

## 2012-03-12 MED ORDER — SITAGLIPTIN PHOSPHATE 100 MG PO TABS
100 MG | ORAL_TABLET | Freq: Every day | ORAL | Status: DC
Start: 2012-03-12 — End: 2012-10-18

## 2012-03-12 NOTE — Progress Notes (Signed)
Subjective:      Patient ID: Carl Blair is a 71 y.o. male.    HPI    Weight reviewed, down a little  Has started walking 3 miles almost every day  Glucoses are better, discussed    Breathing good with exercise  No chest pain    Fasting today    Doesn't check bp at home, discussed  Usually "good"  Discussed goals  At cardiologist 2 months ago, 120/62  Didn't take meds this am, can when fasting, discussed    Review of Systems   Constitutional: Negative for chills, activity change and appetite change.   Respiratory: Negative for shortness of breath and wheezing.    Cardiovascular: Negative for chest pain and leg swelling.   Gastrointestinal: Negative for abdominal pain, diarrhea and constipation.   Musculoskeletal: Positive for arthralgias (better on osteobiflex). Negative for myalgias.   Skin: Negative.    Hematological: Negative for adenopathy. Bruises/bleeds easily.       Objective:   Physical Exam   Constitutional: He is oriented to person, place, and time. He appears well-developed and well-nourished.   HENT:   Head: Normocephalic and atraumatic.   Eyes: Pupils are equal, round, and reactive to light. No scleral icterus.   Cardiovascular: Normal rate, regular rhythm, normal heart sounds and intact distal pulses.    Pulmonary/Chest: Effort normal and breath sounds normal.   Abdominal: Soft. Bowel sounds are normal.   Musculoskeletal: He exhibits no edema.   Feet -- no sores seen  Normal pulses  Filament test normal bilaterally     Neurological: He is alert and oriented to person, place, and time.   Psychiatric: He has a normal mood and affect. His behavior is normal. Judgment and thought content normal.       Assessment:      1. Diabetes mellitus type II  Microalbumin / Creatinine Urine Ratio, Hemoglobin A1c   2. Unspecified hypothyroidism  TSH, T4, free   3. Unspecified essential hypertension  Comprehensive metabolic panel   4. Other and unspecified hyperlipidemia  Lipid panel, CK, Comprehensive metabolic  panel   5. Vitamin D deficiency  Vitamin D 25 hydroxy   6. Elevated CK  CK   7. Morbid obesity             Plan:      See progress note.    Nevyn received counseling on the following healthy behaviors: exercise    Patient given educational materials on Hypertension    I have instructed Treyvin to complete a self tracking handout on Blood Pressures  and instructed them to bring it with them to his next appointment.     Discussed use, benefit, and side effects of prescribed medications.  Barriers to medication compliance addressed.  All patient questions answered.  Pt voiced understanding.

## 2012-03-12 NOTE — Patient Instructions (Addendum)
Patient Self-Management Goal for Chronic Condition  Goal: I will take all medications as prescribed by my doctor, and I will call the office if I am having any medication problems.  Barriers to success: none  Plan for overcoming my barriers: N/A     Confidence: 10/10  Date goal set: 03/12/2012  Date goal attained:     The goal in treating blood pressure is under 130 for the top number and under 80 for the bottom number.

## 2012-03-12 NOTE — Progress Notes (Signed)
BP Readings from Last 2 Encounters:   11/25/11 126/76   08/15/11 120/78     A1c (no units)   Date Value   05/04/2010 6.8*        Hemoglobin A1C (%)   Date Value   11/25/2011 7.0         MICROALBUMIN, RANDOM URINE (mg/dl)   Date Value   1/61/0960 0.60         LDL Calculated (mg/dL)   Date Value   4/54/0981 103*              Tobacco use:  Patient  reports that he has never smoked. He has never used smokeless tobacco.    If Smoker - Cessation materials given? No    Is Daily aspirin on medication list?:  Yes    Diabetic retinal exam done? Yes   If yes, document in Health Maintenance.     Monofilament placed on counter? Yes    Shoes and socks removed? Yes    BP taken with correct size cuff? Yes   Repeated if > 130/80 No     Microalbumin performed if applicable?  Yes

## 2012-03-13 ENCOUNTER — Encounter

## 2012-03-13 LAB — HEMOGLOBIN A1C
Hemoglobin A1C: 6.7 %
eAG: 145.6 mg/dL

## 2012-03-14 LAB — VITAMIN D 25 HYDROXY: Vit D, 25-Hydroxy: 47 ng/mL (ref 30–80)

## 2012-04-16 ENCOUNTER — Encounter

## 2012-04-17 LAB — CK: Total CK: 222 U/L (ref 39–308)

## 2012-04-17 MED ORDER — SIMVASTATIN 20 MG PO TABS
20 MG | ORAL_TABLET | Freq: Every evening | ORAL | Status: DC
Start: 2012-04-17 — End: 2012-09-13

## 2012-05-05 NOTE — Telephone Encounter (Signed)
Patient has diagnosis CHF,DM II HgbA1C 6.7, [facility] patient. Placed in Nurse Care Coordination program.

## 2012-05-25 NOTE — Care Coordination-Inpatient (Signed)
Outgoing call to patient    Explained program of Care Coordination    Discuss daily weights CHF, current level of exercise and diet. Patient states that his wife made some soup from Mrs. Elgie Collard last night, he ate it and gained 2 lbs this am. States he is not going to eat any more. Discuss weight loss resistance: patient states he and his wife went to [place] for awhile, but " it was too expensive, so we had to quit." Offer free dietician consult: patient states he is very interested in consult and will call when he comes back from his trip.    PLAN:  Send PAM,PHQ-9  Call in one month, if hasn't called yet.

## 2012-05-28 MED ORDER — OMEPRAZOLE 40 MG PO CPDR
40 MG | ORAL_CAPSULE | Freq: Every day | ORAL | Status: DC
Start: 2012-05-28 — End: 2012-12-03

## 2012-05-28 MED ORDER — GLYBURIDE-METFORMIN 2.5-500 MG PO TABS
ORAL_TABLET | Freq: Two times a day (BID) | ORAL | Status: DC
Start: 2012-05-28 — End: 2012-12-03

## 2012-05-28 MED ORDER — ATORVASTATIN CALCIUM 10 MG PO TABS
10 MG | ORAL_TABLET | Freq: Every day | ORAL | Status: DC
Start: 2012-05-28 — End: 2012-12-03

## 2012-05-28 MED ORDER — CARVEDILOL 25 MG PO TABS
25 MG | ORAL_TABLET | Freq: Two times a day (BID) | ORAL | Status: DC
Start: 2012-05-28 — End: 2012-12-03

## 2012-05-28 MED ORDER — LEVOTHYROXINE SODIUM 100 MCG PO TABS
100 MCG | ORAL_TABLET | Freq: Every day | ORAL | Status: DC
Start: 2012-05-28 — End: 2012-12-03

## 2012-05-28 MED ORDER — VALSARTAN-HYDROCHLOROTHIAZIDE 320-25 MG PO TABS
320-25 MG | ORAL_TABLET | Freq: Every day | ORAL | Status: DC
Start: 2012-05-28 — End: 2012-12-03

## 2012-05-28 NOTE — Telephone Encounter (Signed)
Last  Visit 03-12-2012

## 2012-06-03 NOTE — Care Coordination-Inpatient (Signed)
PAM assessment performed.  Score of 68.5% and an activation level of 4.    Overall Coaching Approach: PAM Level 4    -  Focus on skills for relapse prevention and maintaining behaviors and coping with stress in times of change.  Learn to "troubleshoot," in advance, for difficult times/events  -  Close any gaps in self-care behavior  -  Create "stretch" goals to further improve health, typically emphasizing nutrition, activity, and coping with stress  -  Monitor for relapse (behaviors and medication adherence) and put in place skills to re-start healthy behaviors  -  Promote social supports and peer-education to create a strong network of health advocates  -  Reinforce the success they've had and their strong self-management to date.  Help them reach new heights

## 2012-06-03 NOTE — Care Coordination-Inpatient (Signed)
See doc flowsheets for initial eval, PHQ-9    PLAN:   Send medication list to reinforce education  Send CHF information  Follow up March 4,2014 for questions  Start telephonic CHF monitoring

## 2012-06-03 NOTE — Care Coordination-Inpatient (Signed)
PHQ Actions:    Level Zero:  Score less than 5 - Excludes Level Nine Response    ________________________________________    PHQ Score <5   No further mandatory action.   Consider re-administering the PHQ-9 in the future should circumstances warrant.   Will remain sensitive to presence of other behavioral health symptoms/conditions.

## 2012-07-26 NOTE — Care Coordination-Inpatient (Signed)
Wife states patient " out doing yard work". Left message to return call

## 2012-09-13 LAB — COMPREHENSIVE METABOLIC PANEL
ALT: 17 U/L (ref 10–40)
AST: 18 U/L (ref 15–37)
Albumin/Globulin Ratio: 1.9 (ref 1.1–2.2)
Albumin: 4.1 g/dL (ref 3.4–5.0)
Alkaline Phosphatase: 80 U/L (ref 40–129)
BUN: 20 mg/dL (ref 7–20)
CO2: 26 mmol/L (ref 21–32)
Calcium: 9.3 mg/dL (ref 8.3–10.6)
Chloride: 99 mmol/L (ref 99–110)
Creatinine: 1.1 mg/dL (ref 0.8–1.3)
GFR African American: >60 (ref >60)
GFR Non-African American: >60 (ref >60)
Globulin: 2.2 g/dL
Glucose: 148 mg/dL — ABNORMAL HIGH (ref 70–99)
Potassium: 4.3 mmol/L (ref 3.5–5.1)
Sodium: 139 mmol/L (ref 136–145)
Total Bilirubin: 0.3 mg/dL (ref 0.0–1.0)
Total Protein: 6.3 g/dL — ABNORMAL LOW (ref 6.4–8.2)

## 2012-09-13 LAB — T4, FREE: T4 Free: 1.4 ng/ml (ref 0.9–1.8)

## 2012-09-13 LAB — LIPID PANEL
Cholesterol, Total: 156 mg/dL (ref 0–199)
HDL: 33 mg/dL — ABNORMAL LOW (ref 40–60)
LDL Calculated: 94 mg/dL (ref <100)
Triglycerides: 144 mg/dL (ref 0–150)
VLDL Cholesterol Calculated: 29 mg/dL

## 2012-09-13 LAB — CK: Total CK: 200 U/L (ref 39–308)

## 2012-09-13 LAB — TSH: TSH: 1.49 u[IU]/mL (ref 0.27–4.20)

## 2012-09-13 NOTE — Assessment & Plan Note (Signed)
Check lab

## 2012-09-13 NOTE — Progress Notes (Signed)
BP Readings from Last 2 Encounters:   03/12/12 132/80   11/25/11 126/76     A1c (no units)   Date Value   05/04/2010 6.8*        Hemoglobin A1C (%)   Date Value   03/12/2012 6.7         MICROALBUMIN, RANDOM URINE (mg/dL)   Date Value   16/04/958 <1.20         LDL Calculated (mg/dL)   Date Value   45/07/979 89               Tobacco use:  Patient  reports that he has never smoked. He has never used smokeless tobacco.    If Smoker - Cessation materials given? No    Is Daily aspirin on medication list?:  Yes    Diabetic retinal exam done? Yes   If yes, document in Health Maintenance.     Monofilament placed on counter? Yes    Shoes and socks removed? Yes    BP taken with correct size cuff? Yes   Repeated if > 130/80 No     Microalbumin performed if applicable?  Yes

## 2012-09-13 NOTE — Assessment & Plan Note (Signed)
discussed

## 2012-09-13 NOTE — Progress Notes (Signed)
Subjective:      Patient ID: Carl Blair is a 72 y.o. male.    HPI    Fasting for blood work    Weight loss discussed  Needs to find a program like [employer]    Not walked in two weeks, was walking a couple miles a day    No new issues    Review of Systems   Constitutional: Negative for fever, chills and unexpected weight change.   Respiratory: Negative for shortness of breath and wheezing.    Cardiovascular: Negative for chest pain and leg swelling.   Gastrointestinal: Negative for abdominal pain, diarrhea and constipation.   Musculoskeletal: Positive for arthralgias. Negative for myalgias.   Neurological: Positive for numbness (left hand still numb despite carpal tunnel surgery, doesn't want it for right hand now). Negative for weakness.   Hematological: Negative for adenopathy. Bruises/bleeds easily.   Psychiatric/Behavioral: Negative for dysphoric mood. The patient is not nervous/anxious.        Objective:   Physical Exam   Constitutional: He is oriented to person, place, and time. He appears well-developed and well-nourished.   obese   Eyes: Pupils are equal, round, and reactive to light. No scleral icterus.   Neck: Normal range of motion. No JVD present.   Cardiovascular: Normal rate, regular rhythm and intact distal pulses.    Pulmonary/Chest: Effort normal and breath sounds normal.   Abdominal: Soft. Bowel sounds are normal.   Musculoskeletal: He exhibits no edema.   Neurological: He is alert and oriented to person, place, and time.   Psychiatric: He has a normal mood and affect. His behavior is normal. Judgment and thought content normal.       Assessment:        ICD-9-CM    1. Diabetes mellitus type II 250.00 Hemoglobin A1c   2. Unspecified essential hypertension 401.9 Comprehensive metabolic panel   3. Hyperlipidemia 272.4 CK     Comprehensive metabolic panel     Lipid panel   4. Congestive heart failure, unspecified 428.0    5. Morbid obesity 278.01    6. Sleep apnea 780.57 Ambulatory referral  to Sleep Studies   7. Carpal tunnel syndrome, unspecified laterality 354.0    8. Vitamin D deficiency 268.9    9. Unspecified hypothyroidism 244.9 T4, free     TSH without Reflex           Plan:      See progress note.

## 2012-09-13 NOTE — Telephone Encounter (Signed)
From: [name]  To: [name], MD  Sent: 09/12/2012 11:19 PM EDT  Subject: Alcohol & Tobacco Use    History questionnaire submitted on Wednesday, September 12, 2012 at 11:19:21 PM  Questionnaire: Alcohol & Tobacco Use  Patient: DEMARKIS GHEEN [[id number]]      Social History:    Question: Alcohol Use  Response: No    Question: Tobacco Use  Response: Never  Comments:

## 2012-09-13 NOTE — Patient Instructions (Addendum)
Patient Self-Management Goal for Chronic Condition  Goal: I will take all medications as prescribed by my doctor, and I will call the office if I am having any medication problems.  Barriers to success: none  Plan for overcoming my barriers: N/A     Confidence: 10/10  Date goal set: 09/13/2012  Date goal attained:     Please bring your home blood pressure records to your next appointment.

## 2012-09-13 NOTE — Assessment & Plan Note (Signed)
good

## 2012-09-13 NOTE — Assessment & Plan Note (Signed)
Discussed  Watch  Wear splints at night

## 2012-09-14 LAB — HEMOGLOBIN A1C
Hemoglobin A1C: 6.5 %
eAG: 139.9 mg/dL

## 2012-09-28 NOTE — Care Coordination-Inpatient (Signed)
Outgoing telephone call- no answer    Sent daily weight chart and Heart healthy diet for CHF

## 2012-10-18 ENCOUNTER — Encounter

## 2012-10-18 MED ORDER — SITAGLIPTIN PHOSPHATE 100 MG PO TABS
100 MG | ORAL_TABLET | Freq: Every day | ORAL | Status: DC
Start: 2012-10-18 — End: 2012-12-27

## 2012-10-18 NOTE — Telephone Encounter (Signed)
Last  Visit 09-13-2012

## 2012-12-03 MED ORDER — LEVOTHYROXINE SODIUM 100 MCG PO TABS
100 MCG | ORAL_TABLET | Freq: Every day | ORAL | Status: DC
Start: 2012-12-03 — End: 2013-05-23

## 2012-12-03 MED ORDER — VALSARTAN-HYDROCHLOROTHIAZIDE 320-25 MG PO TABS
320-25 MG | ORAL_TABLET | Freq: Every day | ORAL | Status: DC
Start: 2012-12-03 — End: 2013-05-23

## 2012-12-03 MED ORDER — GLYBURIDE-METFORMIN 2.5-500 MG PO TABS
ORAL_TABLET | Freq: Two times a day (BID) | ORAL | Status: DC
Start: 2012-12-03 — End: 2013-03-28

## 2012-12-03 MED ORDER — GLYBURIDE-METFORMIN 2.5-500 MG PO TABS
ORAL_TABLET | Freq: Two times a day (BID) | ORAL | Status: DC
Start: 2012-12-03 — End: 2012-12-03

## 2012-12-03 MED ORDER — ATORVASTATIN CALCIUM 10 MG PO TABS
10 MG | ORAL_TABLET | Freq: Every day | ORAL | Status: DC
Start: 2012-12-03 — End: 2013-05-23

## 2012-12-03 MED ORDER — CARVEDILOL 25 MG PO TABS
25 MG | ORAL_TABLET | Freq: Two times a day (BID) | ORAL | Status: DC
Start: 2012-12-03 — End: 2013-05-23

## 2012-12-03 MED ORDER — OMEPRAZOLE 40 MG PO CPDR
40 MG | ORAL_CAPSULE | Freq: Every day | ORAL | Status: DC
Start: 2012-12-03 — End: 2013-05-23

## 2012-12-03 NOTE — Telephone Encounter (Signed)
Wants to pick up  Last  Visit 09-13-2012

## 2012-12-11 NOTE — Care Coordination-Inpatient (Signed)
Outgoing telephone call to assess current CHF management  No answer- [facility]  Will send  " Heart Attack Symptoms"  " Learn What a Heart Attack Feels Like"  as well as a wallet card with the same information provided by the [place] [employer] and [employer]

## 2012-12-27 ENCOUNTER — Telehealth

## 2012-12-27 MED ORDER — SITAGLIPTIN PHOSPHATE 100 MG PO TABS
100 MG | ORAL_TABLET | Freq: Every day | ORAL | Status: DC
Start: 2012-12-27 — End: 2013-03-21

## 2012-12-27 NOTE — Telephone Encounter (Signed)
Samples are upfront. Authorized by Dr Newman Pies

## 2013-01-22 ENCOUNTER — Ambulatory Visit: Admit: 2013-01-22 | Discharge: 2013-01-22 | Payer: MEDICARE | Attending: Medical

## 2013-01-22 DIAGNOSIS — G4733 Obstructive sleep apnea (adult) (pediatric): Secondary | ICD-10-CM

## 2013-01-22 NOTE — Unmapped (Signed)
01/22/2013  ESS:12 WT:269.4 lbs  LOV:04/14/2011  ESS:12 WT:277.2 lbs    Date of NPSG: 05/27/2003   SE %: 63.5   AHI: 52   Lowest p ox %: 73   NPSG performed at: [facility]     Date of CPAP: 12/06/2008   SE %: 76.5   AHI: 1.1   Lowest p ox %: 90.0   CPAP performed at: [facility] @ time of study:  265 lbs  DME Company: [facility]  Mask type: full face  cmH2O: 13     Pt is here for f/up on OSAHS w/ pap therapy.Pt brought equipment and has no complaints at this time.    Nicolette Bang, LPN  16/01/9603 1:47 PM    ___________________________________________________      BP 118/78   Pulse 78   Resp 16   Ht 6' (1.829 m)   Wt 269 lb 6.4 oz (122.199 kg)   BMI 36.53 kg/m2   SpO2 96%    Subjective:     OSAHS: lost to f/up, nearly two yrs since last  follow up using CPAP therapy nightly (AHI 52/hr after UP3, 146/hr prior to surgery).  Pt is not having any difficulty sleeping or with EDS affecting daytime driving or activities.     Epworth continues to reflect excessive daytime sleepiness (ESS 12). Sleep hx: in bed at midnight - 1 AM, waking up twice to void, out of bed by 8 AM. Wakes up refreshed. No Ha's. EDS continues to be associated with boredom, and never falls asleep when watching something interesting.     PMH: HTN,  DM, atrial fib - corrected with mini-maze procedure) GERD, dylipidemia.     ROS: neg dyspnea, PND, orthopnea, or nocturnal cough. DM A1c is in the 7's, 8 less lbs since last OV.    Objective:    Compliance Data Download:  6 mos   CPAP Pressure:  13  Percent Days with Device Usage: 93  Percent of Days with usage >= 4 hours  85  Average Usage (number of hours used): 6.5  AHI: n/a    PE: HEENT: Mall IV, CV RRR w/o murmur, Lungs BL clear to A, LE's w/o edema    Assessment / Plan:     1) Severe OSAHS: unknown airway control / device w/o AHI capability. Pt denies sleep issues, however last retitration was in 2010.     The patient was educated re: OSAHS pathophysiology, relationship with cardiovascular morbidity and  mortality, and increased risk of metabolic morbidity, issues related treatment options. Patient verbalized understanding.    RTC annually, with Rx for new AHI device recommended next yr.             ELECTRONICALLY SIGNED BY Otelia Santee, MS, PA-C  01/22/2013 1:56 PM

## 2013-01-22 NOTE — Unmapped (Signed)
Key points to remember  ?? You should wear the device every time you sleep, no matter when you sleep or where you sleep. When you nap or travel or go on vacation you need to use the machine when asleep.   ?? If you sleep without the device your Obstructive Sleep Apnea is untreated and your risk of heart attacks and strokes are higher.   ?? Clean the device on a regularly basis to avoid sinus infections and pneumonias   ?? Never travel with water in the reservoir   ?? Replace supplies on a regularly basis (see schedule below). You may need to contact your Durable Medical Equipment company (DME) and request supplies (just like you would medications from a pharmacy).   ?? If you ever have problems with the device please come in and see us at our CPAP education clinic. Contact us if you have any concerns. Thank you.     CLEANING INSTRUCTIONS FOR PAP EQUIPMENT     Keeping your equipment and supplies clean is very important.     REMINDER: Only use DISTILLED WATER in your humidifier, Empty water daily.    DAILY  Mask and tubing:   ?? Wash your face before applying mask  ?? Wash mask and tubing with baby shampoo and warm water.     Humidifier:   ?? Empty water in reservoir  ?? Clean with baby shampoo and warm water  ?? Rinse, then air dry    WEEKLY  Mask and tubing:   ?? Soak your mask and tubing in 1 part vinegar and 3 parts water for 30 minutes. Rinse, and allow to air dry.     Humidifier:   ?? Wash with warm water and baby shampoo  ?? Soak in 1 part vinegar and 3 parts water for 30 minutes  ?? Rinse with warm water and allow to air dry    Machine Exterior:   ?? Wipe with a clean damp cloth    MONTHLY AND/OR AS NEEDED  ?? Reusable foam filters (black filter)- wash in warm water with baby shampoo. Rinse well and dry with paper towel  ?? Disposable felt filter (white filter)- Replace filter every two weeks to once a month    NOTE: If you are having repeated sinus and /or respiratory infections, dirty equipment may be the cause. It may help  to clean and disinfect your equipment more frequently    TRAVELING  Always make sure the humidifier is empty when traveling. (including doctor appointments, air travel or long distance driving).  When flying, always carry your PAP device with you as a carry on item, NEVER check it in as baggage. We can provide you with a letter stating it is a medical device, talk to your provider.    Always make sure you have your mask and tubing with you. You will need appropriate plug adapters when traveling outside the [place].    If travelling by air and you are unable to carry distilled water with youuse bottled water (no more than 2 weeks). DO NOT USE TAP WATER.       Equipment Replacement Schedule    To get the most benefit from your PAP therapy, your equipment should be replaced when necessary based on wear and tear. For example, your mask may need to be replaced if you notice it is cracked or the seal is leaking. If your tubing is torn, it needs to be replaced.   If you equipment is showing signs   of wear, you may be entitled to replace it. The replacement schedule for Medicare patients is shown below. If you are NOT a Medicare patient, please check with your DME (Durable Medical Supply) provider for your individual insurance policy???s replacement schedule.   Equipment    Medicare   My Insurance Plan  Mask     1 per 3 months  _______________  Nasal replacement cushion  2 per month   _______________  Pillows replacement cushion 2 per month   _______________  Full-face cushion   1 per month   _______________  Headgear    1 per 6 months  _______________  Water Chamber   1 per 6 months  _______________  Chinstrap    1 per 6 months  _______________  Tubing/Hose    1 per 3 months  _______________  Filter, fine disposable (white) 2 per month   _______________  Filter, gross particle foam (black) 1 per 6 months  _______________  Therapy device   1 per 5 years   _______________    Follow up and compliance goals for Medicare  patients         Medicare Requirement  Follow-up appointment with physician  31-90 days after initial setup   Compliance goals 4 hours per night AND 30 nights of consecutive use        CPAP EDUCATION CLINIC    CPAP Education Clinic is a service we provide to all our patients where you can take advantage of consulting with a registered sleep technologist if you are having any issues with your PAP device or mask. These appointments are available several times a week at normal business hours. At this visit you can discuss any issues you may be having with your device. We will obtain a download from your device to see how well the machine is working for you at home. The download will let us know when you put on the device, how long you had it on, did you have any leaks, are you still have breathing events at the prescribed pressure.     This is your tech support, if you are having any issues with the mask such as you wake up with lines on your face (the mask is too tight we can adjust it for you), or you wake up with a dry mouth in the morning (we can adjust the humidity setting), etc.     It is strongly advised that you follow up in CPAP Education clinic within 2-3 weeks after receiving your PAP device from your medical supply company (DME). At this visit we will review cleaning instructions and replacement schedule for your PAP supplies.  It is our goal to address any issues you have with the device and mask early on so that you are comfortable with PAP therapy. If you have more questions regarding CPAP Education Clinic contact us for more details.      Once you receive your device and supplies, please make sure to bring all pieces to every office visit such as: device, mask, power cord, tubing, water reservoir, SD card/downloadable chip etc. Thank you    Please note that if you are provided with a mask during your overnight sleep study or by your medical supply company you have 30 days to make any changes or  exchanges. After the 30 day period in most cases no changes can be made for up to 3 months or longer depending on your insurance. If you have any questions please contact our medical   supply company or follow up with us in CPAP education clinic to learn more.

## 2013-01-22 NOTE — Unmapped (Signed)
error 

## 2013-01-23 NOTE — Unmapped (Signed)
Faxed PAP Order w/ [employer] (01/22/2013) to DME co [facility]. Fax confirmation rec'd.

## 2013-02-07 ENCOUNTER — Emergency Department: Admit: 2013-02-07 | Payer: MEDICARE

## 2013-02-07 ENCOUNTER — Inpatient Hospital Stay
Admission: EM | Admit: 2013-02-07 | Discharge: 2013-02-08 | Disposition: A | Discharge status: Home / Self Care | Payer: MEDICARE

## 2013-02-07 DIAGNOSIS — S2249XA Multiple fractures of ribs, unspecified side, initial encounter for closed fracture: Secondary | ICD-10-CM

## 2013-02-07 LAB — CBC
Hematocrit: 38.7 % (ref 38.5–50.0)
Hemoglobin: 13 g/dL (ref 13.2–17.1)
MCH: 28.5 pg (ref 27.0–33.0)
MCHC: 33.6 g/dL (ref 32.0–36.0)
MCV: 84.7 fL (ref 80.0–100.0)
MPV: 6.9 fL (ref 7.5–11.5)
Platelets: 219 10*3/uL (ref 140–400)
RBC: 4.56 10*6/uL (ref 4.20–5.80)
RDW: 15 % (ref 11.0–15.0)
WBC: 8.7 10*3/uL (ref 3.8–10.8)

## 2013-02-07 LAB — BASIC METABOLIC PANEL
Anion Gap: 9 mmol/L (ref 3–16)
BUN: 26 mg/dL (ref 7–25)
CO2: 27 mmol/L (ref 21–33)
Calcium: 8.7 mg/dL (ref 8.6–10.2)
Chloride: 101 mmol/L (ref 98–110)
Creatinine: 1.08 mg/dL (ref 0.50–1.30)
GFR MDRD Af Amer: 82 See note.
GFR MDRD Non Af Amer: 67 See note.
Glucose: 224 mg/dL (ref 65–99)
Osmolality, Calculated: 296 mOsm/kg (ref 278–305)
Potassium: 4.5 mmol/L (ref 3.5–5.3)
Sodium: 137 mmol/L (ref 135–146)

## 2013-02-07 LAB — DIFFERENTIAL
Basophils Absolute: 17 /uL (ref 0–200)
Basophils Relative: 0.2 % (ref 0.0–1.0)
Eosinophils Absolute: 261 /uL (ref 15–500)
Eosinophils Relative: 3 % (ref 0.0–8.0)
Lymphocytes Absolute: 3410 /uL (ref 850–3900)
Lymphocytes Relative: 39.2 % (ref 15.0–45.0)
Monocytes Absolute: 635 /uL (ref 200–950)
Monocytes Relative: 7.3 % (ref 0.0–12.0)
Neutrophils Absolute: 4376 /uL (ref 1500–7800)
Neutrophils Relative: 50.3 % (ref 40.0–80.0)

## 2013-02-07 LAB — PROTIME-INR
INR: 0.9 (ref 0.9–1.1)
Protime: 12.1 seconds (ref 11.6–14.4)

## 2013-02-07 MED ORDER — glyBURIDE (DIABETA) tablet 2.5 mg
5 | Freq: Two times a day (BID) | ORAL | Status: AC
Start: 2013-02-07 — End: 2013-02-08
  Administered 2013-02-07 – 2013-02-08 (×2): via ORAL

## 2013-02-07 MED ORDER — sitagliptan (JANUVIA) tablet 100 mg
100 | Freq: Every morning | ORAL | Status: AC
Start: 2013-02-07 — End: 2013-02-08
  Administered 2013-02-08: 13:00:00 via ORAL

## 2013-02-07 MED ORDER — HYDROmorphone (DILAUDID) injection Syrg 0.5 mg
0.5 | INTRAMUSCULAR | Status: AC | PRN
Start: 2013-02-07 — End: 2013-02-08

## 2013-02-07 MED ORDER — metFORMIN (GLUCOPHAGE) tablet 500 mg
500 | Freq: Two times a day (BID) | ORAL | Status: AC
Start: 2013-02-07 — End: 2013-02-08
  Administered 2013-02-07 – 2013-02-08 (×2): via ORAL

## 2013-02-07 MED ORDER — pantoprazole (PROTONIX) EC tablet 40 mg
40 | Freq: Every day | ORAL | Status: AC
Start: 2013-02-07 — End: 2013-02-08
  Administered 2013-02-08: 10:00:00 via ORAL

## 2013-02-07 MED ORDER — oxyCODONE (ROXICODONE) immediate release tablet 5 mg
5 | ORAL | Status: AC | PRN
Start: 2013-02-07 — End: 2013-02-08

## 2013-02-07 MED ORDER — valsartan (DIOVAN) tablet 320 mg
160 | Freq: Every day | ORAL | Status: AC
Start: 2013-02-07 — End: 2013-02-08
  Administered 2013-02-08: 13:00:00 via ORAL

## 2013-02-07 MED ORDER — HYDROmorphone (DILAUDID) injection Syrg 1 mg
1 | INTRAMUSCULAR | Status: AC | PRN
Start: 2013-02-07 — End: 2013-02-08

## 2013-02-07 MED ORDER — carvedilol (COREG) tablet 25 mg
12.5 | Freq: Two times a day (BID) | ORAL | Status: AC
Start: 2013-02-07 — End: 2013-02-08
  Administered 2013-02-08 (×2): via ORAL

## 2013-02-07 MED ORDER — HYDROmorphone (DILAUDID) injection Syrg 0.5-1 mg
0.5 | INTRAMUSCULAR | Status: AC | PRN
Start: 2013-02-07 — End: 2013-02-07

## 2013-02-07 MED ORDER — OMNIPAQUE (iohexol) 350 mg iodine/mL 100 mL
350 | Freq: Once | INTRAVENOUS | Status: AC | PRN
Start: 2013-02-07 — End: 2013-02-07
  Administered 2013-02-07: 16:00:00 via INTRAVENOUS

## 2013-02-07 MED ORDER — hydrochlorothiazide (HYDRODIURIL) tablet 25 mg
25 | Freq: Every day | ORAL | Status: AC
Start: 2013-02-07 — End: 2013-02-08
  Administered 2013-02-08: 13:00:00 via ORAL

## 2013-02-07 MED ORDER — enoxaparin (LOVENOX) syringe 30 mg/0.3 mL
30 | Freq: Two times a day (BID) | SUBCUTANEOUS | Status: AC
Start: 2013-02-07 — End: 2013-02-08
  Administered 2013-02-08 (×2): via SUBCUTANEOUS

## 2013-02-07 MED ORDER — acetaminophen (TYLENOL) tablet 1,000 mg
500 | Freq: Three times a day (TID) | ORAL | Status: AC
Start: 2013-02-07 — End: 2013-02-08
  Administered 2013-02-08 (×2): via ORAL

## 2013-02-07 MED ORDER — oxyCODONE (ROXICODONE) immediate release tablet 10 mg
5 | ORAL | Status: AC | PRN
Start: 2013-02-07 — End: 2013-02-08
  Administered 2013-02-08 (×2): via ORAL

## 2013-02-07 MED ORDER — morphine injection 4 mg
4 | Freq: Once | INTRAMUSCULAR | Status: AC
Start: 2013-02-07 — End: 2013-02-07
  Administered 2013-02-07: 15:00:00 via INTRAVENOUS

## 2013-02-07 MED ORDER — aspirin EC tablet 81 mg
81 | Freq: Every day | ORAL | Status: AC
Start: 2013-02-07 — End: 2013-02-08
  Administered 2013-02-08: 13:00:00 via ORAL

## 2013-02-07 MED ORDER — sodium chloride 0.9 % infusion
INTRAVENOUS | Status: AC
Start: 2013-02-07 — End: 2013-02-08

## 2013-02-07 MED ORDER — levothyroxine (SYNTHROID, LEVOTHROID) tablet 100 mcg
100 | Freq: Every day | ORAL | Status: AC
Start: 2013-02-07 — End: 2013-02-08
  Administered 2013-02-08: 10:00:00 via ORAL

## 2013-02-07 MED ORDER — atorvastatin (LIPITOR) tablet 10 mg
10 | Freq: Every evening | ORAL | Status: AC
Start: 2013-02-07 — End: 2013-02-08
  Administered 2013-02-08: 02:00:00 via ORAL

## 2013-02-07 MED ORDER — ondansetron (ZOFRAN) 4 mg/2 mL injection 4 mg
4 | Freq: Four times a day (QID) | INTRAMUSCULAR | Status: AC | PRN
Start: 2013-02-07 — End: 2013-02-08

## 2013-02-07 MED FILL — MORPHINE 4 MG/ML INJECTION SYRINGE: 4 4 mg/mL | INTRAMUSCULAR | Qty: 1

## 2013-02-07 MED FILL — GLYBURIDE 5 MG TABLET: 5 5 MG | ORAL | Qty: 1

## 2013-02-07 MED FILL — SODIUM CHLORIDE 0.9 % INTRAVENOUS SOLUTION: INTRAVENOUS | Qty: 50

## 2013-02-07 MED FILL — CARVEDILOL 12.5 MG TABLET: 12.5 12.5 MG | ORAL | Qty: 2

## 2013-02-07 MED FILL — OXYCODONE 5 MG TABLET: 5 5 MG | ORAL | Qty: 2

## 2013-02-07 MED FILL — LOVENOX 30 MG/0.3 ML SUBCUTANEOUS SYRINGE: 30 30 mg/0.3 mL | SUBCUTANEOUS | Qty: 0.3

## 2013-02-07 MED FILL — METFORMIN 500 MG TABLET: 500 500 MG | ORAL | Qty: 1

## 2013-02-07 MED FILL — TYLENOL EXTRA STRENGTH 500 MG TABLET: 500 500 mg | ORAL | Qty: 2

## 2013-02-07 MED FILL — LIPITOR 10 MG TABLET: 10 10 mg | ORAL | Qty: 1

## 2013-02-07 NOTE — Unmapped (Signed)
Trauma MD at bedside Dr Bridgette Habermann

## 2013-02-07 NOTE — Unmapped (Signed)
Medication Reconciliation  [place] [facility]  Department of Pharmacy Services    Medication reconciliation has been completed by Pharmacist.    Information obtained from [facility], patient recollection.    The patient is taking the following medications were already in or were added to the patient's Prior To Arrival list:  Previous Medications    ASPIRIN 81 MG EC TABLET    Take 81 mg by mouth daily.    ATORVASTATIN (LIPITOR) 10 MG TABLET    Take 10 mg by mouth at bedtime.     CARVEDILOL (COREG) 25 MG TABLET    Take 25 mg by mouth 2 (two) times daily.      GLUC/CHON-MSM#1/C/MANG/BOS/BOR (OSTEO BI-FLEX ORAL)    Take by mouth. OSTEO BI-FLEX JOINT SHIELD TABS     GLYBURIDE-METFORMIN (GLUCOVANCE) 2.5-500 MG PER TABLET    Take 2 tablets by mouth 2 times a day.     LEVOTHYROXINE (SYNTHROID, LEVOTHROID) 100 MCG TABLET    Take 100 mcg by mouth every morning.     OMEPRAZOLE (PRILOSEC) 40 MG CAPSULE    Take 40 mg by mouth every morning.     SAW PALMETTO XTR/ZINC PICOLIN (SAW PALMETTO EXTRACT ORAL)    Take 2 tablets by mouth 2 times a day.     SILDENAFIL (VIAGRA) 100 MG TABLET    Take 100 mg by mouth daily as needed. 1/2 of a pill daily prn     SITAGLIPTAN (JANUVIA) 100 MG TABLET    Take 100 mg by mouth every morning.     VALSARTAN-HYDROCHLOROTHIAZIDE (DIOVAN-HCT) 320-25 MG PER TABLET    Take 1 tablet by mouth every morning.        To my knowledge the above medication reconciliation is accurate as of 02/07/2013. Please contact with questions and/or concerns.     Lenis Dickinson, PharmD, BCPS [phone]  Clinical Specialist, Emergency Medicine  02/07/2013 2:27 PM

## 2013-02-07 NOTE — Unmapped (Signed)
Initial check of patient with their home cpap order.  Patients states they would like to be placed on our unit  at 2200

## 2013-02-07 NOTE — Unmapped (Signed)
Patient updated on plan of care and informed of any delays.  Call light is in reach and bed rails up for safety.  No concerns voiced at this time. The patient was advised to use call light if any concerns or questions.

## 2013-02-07 NOTE — Unmapped (Signed)
Trauma Alert called.

## 2013-02-07 NOTE — Unmapped (Addendum)
Pain okay  as long as I don't moveUsing Incentive spirometry to 1600

## 2013-02-07 NOTE — Unmapped (Signed)
IS teaching completed with return demo.  Updated patient re: plan of care, pending tests. Verbalized understanding.

## 2013-02-07 NOTE — Unmapped (Signed)
TRAUMA SERVICE H&P    02/07/2013                 10:22 PM    [name]    Circumstances of Trauma:    Injury Date: 02/07/2013            Injury Time: afternoon           Trauma Service Activation:  Consult    Reason Called: Multiple Rib Fx                   Pre-notification Time (Paged): Na    ED Attending: Conine      Referring Hospital: na    Scene Evaluation by EMS/Air Care: No    Transport Mode: Walk-in     Trauma Team:  Attending: Teola Bradley    Mechanism:   Fall from Ladder- 6 feet    Blood prior to arrival: No  LOC No  Duration:    GCS@ scene: Eyes: 4  Verbal: 5  Motor: 6  Score: 15     History of Present Illness: See below  Incident Background:  72 yo M fell from the top of a 6 foot ladder landing on his Right chest. No LOC. He was cleaning rain gutters. Currently sitting comfortably on room air. Pulls 1500 ml on his IS. Weak cough however.     Medications:   Aspirin  Yes    Prior Trauma:  None    Past Medical/Surgical/Family/Social History:  See below  Past Medical History   Diagnosis Date   ??? Atrial fibrillation    ??? Hypercholesteremia    ??? Hypertension    ??? Dilated cardiomyopathy    ??? Diabetes mellitus      type 2   ??? Thyroid disease      hypothyroidism   ??? Sleep apnea      Past Surgical History   Procedure Laterality Date   ??? Septoplasty     ??? Uvulopalatopharyngoplasty     ??? Hernia repair  9/09     right inguinal, mini maze   ??? Hernia repair  2/10     herniated lungs bilateral (repaired with mesh)   ??? Uvulopalatopharyngoplasty       has had esophageal dilations in the past (Dr.Decter)     History   Substance Use Topics   ??? Smoking status: Never Smoker    ??? Smokeless tobacco: Not on file      Comment: 03/19/07   ??? Alcohol Use: No      Comment: occasionally 04-14-11     Family History   Problem Relation Age of Onset   ??? Diabetes Mother      type 2   ??? Hypertension Mother    ??? Diabetes Sister      type 2   ??? Coronary artery disease Sister    ??? Coronary artery disease Brother    ??? Hypertension Brother    ???  Diabetes Brother      type 2   ??? Coronary artery disease Sister    ??? Hypertension Brother      Allergies: No Known Allergies    No current facility-administered medications on file prior to encounter.     Current Outpatient Prescriptions on File Prior to Encounter   Medication Sig Dispense Refill   ??? atorvastatin (LIPITOR) 10 MG tablet Take 10 mg by mouth at bedtime.        ??? carvedilol (COREG) 25 MG tablet Take 25  mg by mouth 2 (two) times daily.         ??? GLUC/CHON-MSM#1/C/MANG/BOS/BOR (OSTEO BI-FLEX ORAL) Take by mouth. OSTEO BI-FLEX JOINT SHIELD TABS        ??? glyBURIDE-metformin (GLUCOVANCE) 2.5-500 mg per tablet Take 2 tablets by mouth 2 times a day.        ??? levothyroxine (SYNTHROID, LEVOTHROID) 100 MCG tablet Take 100 mcg by mouth every morning.        ??? omeprazole (PRILOSEC) 40 MG capsule Take 40 mg by mouth every morning.        ??? SAW PALMETTO XTR/ZINC PICOLIN (SAW PALMETTO EXTRACT ORAL) Take 2 tablets by mouth 2 times a day.        ??? sitagliptan (JANUVIA) 100 MG tablet Take 100 mg by mouth every morning.        ??? valsartan-hydrochlorothiazide (DIOVAN-HCT) 320-25 mg per tablet Take 1 tablet by mouth every morning.        ??? sildenafil (VIAGRA) 100 MG tablet Take 100 mg by mouth daily as needed. 1/2 of a pill daily prn        ??? [DISCONTINUED] aspirin 81 mg Tab Take by mouth.         ??? [DISCONTINUED] GLUCOSAMINE SULFATE 2KCL (GLUCOSAMINE SU 2KCL, BULK,) 100 % Powd by Misc.(Non-Drug; Combo Route) route.             Review of Systems   Constitutional: Negative for fever and chills.   HENT: Negative for neck pain and postnasal drip.    Eyes: Negative for pain.   Respiratory: Negative for apnea, chest tightness and shortness of breath.    Cardiovascular: Negative for chest pain and leg swelling.   Gastrointestinal: Negative for nausea.   Genitourinary: Negative for dysuria and hematuria.   Musculoskeletal: Negative for back pain and arthralgias.   Skin: Negative for pallor and rash.   Neurological: Negative for  syncope and headaches.   Psychiatric/Behavioral: Negative for behavioral problems and agitation.       Airway:  Intubated: No  Pulse: 75  B/P: 140/70  RR: 18 Temp: 98.6  SpO2: 99%   Eyes: 4  Verbal: 5  Motor: 6  GCS: 15  Physical Exam   Constitutional: He is oriented to person, place, and time. He appears well-developed and well-nourished.   HENT:   Head: Normocephalic and atraumatic.   Mouth/Throat: Oropharynx is clear and moist.   Eyes: Conjunctivae and EOM are normal. Pupils are equal, round, and reactive to light. No scleral icterus.   Neck: Normal range of motion. Neck supple.   Cardiovascular: Normal rate, regular rhythm and normal heart sounds.    Pulmonary/Chest: Effort normal and breath sounds normal.   Abdominal: Soft. Bowel sounds are normal.   Musculoskeletal: Normal range of motion.   Neurological: He is alert and oriented to person, place, and time.   Skin: Skin is warm and dry.   Psychiatric: He has a normal mood and affect. His behavior is normal.       Labs:       Lab 02/07/13  1112   GLUCOSE 224*   BUN 26*   CREATININE 1.08   POTASSIUM 4.5   SODIUM 137   CHLORIDE 101   CALCIUM 8.7         Lab 02/07/13  1112   WBC 8.7   HEMOGLOBIN 13.0*   HEMATOCRIT 38.7   MEAN CORPUSCULAR VOLUME 84.7   PLATELETS 219  Lab 02/07/13  1112   INR 0.9         Lab 02/07/13  1112   CALCIUM 8.7                                Imaging:  X-ray Chest Pa/l    02/07/2013   Chest PA and lateral  There is pleural thickening along the lateral aspect of the right lower hemithorax measures at least 1 cm in thickness. Atelectatic changes or scarring is noted in the right lower lobe. The left lung is clear.  IMPRESSION  Pleural thickening in the right lower hemithorax laterally. Scarring or atelectasis in the lower lobe.  Report Verified by: Roney Jaffe, MD at 02/07/2013 11:21 AM    Ct Chest With Iv Contrast    02/07/2013   Addendum:  There is a mildly displaced fracture seen involving the anterior aspect of  the second  rib on the right. Mildly displaced lateral rib fractures are  also noted involving the fifth and sixth ribs on the right. There is also  questionable nondisplaced fracture of the right clavicle. This was  discussed with Vela Prose by telephone at 1:10 PM on 02/07/2013.  Report Verified by: Mosie Lukes, MD at 02/07/2013 1:23 PM    02/07/2013   CT CHEST, ABDOMEN AND PELVIS WITH CONTRAST  HISTORY: Trauma  Comparison: 03/11/2010 and 02/04/2009  TECHNICAL FACTORS: Following administration of IV contrast axial imaging was performed of the chest, abdomen and pelvis.    CT CHEST:  Bibasilar parenchymal scarring is seen with bilateral intercostal lung herniations present which were likewise seen on prior study but appears slightly more prominent. There is no gross consolidation, effusion, or pneumothorax seen.  No suspicious parenchymal masses are identified.  No mediastinal or hilar adenopathy is identified. No axillary adenopathy is identified.  Coronary calcifications are present.       IMPRESSION:  Bilateral anterior intercostal lung herniations which are likewise noted on prior study and may be related to prior surgery. However these appear slightly more prominent than prior examination.  Mild bibasilar atelectatic changes or progressive scarring.  No gross infiltrate or effusion seen.     CT ABDOMEN:       The liver shows uniform attenuation with no focal mass lesion or significant biliary ductal dilation.  Gall bladder shows no definite stones or wall thickening.  Spleen is grossly within normal limits.  Pancreas shows no gross mass or inflammatory changes.  Adrenal glands show no gross mass.  Kidneys enhance uniformly with no evidence of suspicious mass or hydronephrosis.  No retroperitoneal adenopathy is seen.  Bowel gas pattern is nonspecific with no evidence of obstruction or free air seen.  Appendix is normal.  CT PELVIS:  No free fluid is identified.  No evidence of abnormal mass or adenopathy is seen.   No evidence of acute inflammatory process is seen.       IMPRESSION:  No evidence of acute abdominal or pelvic pathology identified.         Report Verified by: Mosie Lukes, MD at 02/07/2013 1:00 PM    Ct Abdomen And Pelvis With Iv Contrast    02/07/2013   Addendum:  There is a mildly displaced fracture seen involving the anterior aspect of  the second rib on the right. Mildly displaced lateral rib fractures are  also noted involving the fifth and sixth ribs on the right. There is  also  questionable nondisplaced fracture of the right clavicle. This was  discussed with Vela Prose by telephone at 1:10 PM on 02/07/2013.  Report Verified by: Mosie Lukes, MD at 02/07/2013 1:23 PM    02/07/2013   CT CHEST, ABDOMEN AND PELVIS WITH CONTRAST  HISTORY: Trauma  Comparison: 03/11/2010 and 02/04/2009  TECHNICAL FACTORS: Following administration of IV contrast axial imaging was performed of the chest, abdomen and pelvis.    CT CHEST:  Bibasilar parenchymal scarring is seen with bilateral intercostal lung herniations present which were likewise seen on prior study but appears slightly more prominent. There is no gross consolidation, effusion, or pneumothorax seen.  No suspicious parenchymal masses are identified.  No mediastinal or hilar adenopathy is identified. No axillary adenopathy is identified.  Coronary calcifications are present.       IMPRESSION:  Bilateral anterior intercostal lung herniations which are likewise noted on prior study and may be related to prior surgery. However these appear slightly more prominent than prior examination.  Mild bibasilar atelectatic changes or progressive scarring.  No gross infiltrate or effusion seen.     CT ABDOMEN:       The liver shows uniform attenuation with no focal mass lesion or significant biliary ductal dilation.  Gall bladder shows no definite stones or wall thickening.  Spleen is grossly within normal limits.  Pancreas shows no gross mass or inflammatory  changes.  Adrenal glands show no gross mass.  Kidneys enhance uniformly with no evidence of suspicious mass or hydronephrosis.  No retroperitoneal adenopathy is seen.  Bowel gas pattern is nonspecific with no evidence of obstruction or free air seen.  Appendix is normal.  CT PELVIS:  No free fluid is identified.  No evidence of abnormal mass or adenopathy is seen.  No evidence of acute inflammatory process is seen.       IMPRESSION:  No evidence of acute abdominal or pelvic pathology identified.         Report Verified by: Mosie Lukes, MD at 02/07/2013 1:00 PM      Problems/Diagnosis/Assessment Plan:    72 yo M fall from 6 feet with the following:     R Rib fractures 2, 5-6,     Given his age and comorbities he will be admitted for obersvation and pain control.     Multiple Rib Fx  -aggressive pain control  -IS/Ambulate    HTN  Past Hx Afib  -resume home meds    DM:   -resume home meds.   -SSI    GERD:   -PPI    DVT-P:   -lovenox BID    Bowel Regimen:       Disposition: can DC home when tolerating cough, ambulating, and good IS.     Teola Bradley MD  [facility]  Division of Trauma, Surgical Critical Care, and Acute Care Surgery  Cell Phone: [phone]  [facility] Trauma/ACS  Cell: [phone]                Emmerie Battaglia  A Hooria Gasparini  02/07/2013

## 2013-02-07 NOTE — Unmapped (Signed)
Patient is resting comfortably.

## 2013-02-07 NOTE — Unmapped (Signed)
Sts was on a ladder cleaning out the gutters and fell onto concrete. Denies striking head or LOC. A&O x3 at this time. Sts only pain is to R rib area.

## 2013-02-07 NOTE — Unmapped (Signed)
[place] ED Note    Date of Service: 02/07/2013    Reason for Visit: Fall      Patient History     HPI:  [name] is a 72 y.o. male with a past medical history of atrial fibrillation, hyperlipidemia, hypertension, and diabetes here today after a fall    The patient states that he was working on a ladder when he lost his balance and fell approximately 5-6 feet.  He does not think he struck his head.  He did not lose consciousness.  He has no neck or back pain.  He is complaining of right upper chest wall pain.  This does make her mildly short of breath.  He denies any abdominal pain.  No lower extremity pain.  He has been able to ambulate without difficulty.  He states his pain is currently 6/10 and is worse with taking a deep breath and moving his right upper extremity.       Past Medical History   Diagnosis Date   ??? Atrial fibrillation    ??? Hypercholesteremia    ??? Hypertension    ??? Dilated cardiomyopathy    ??? Diabetes mellitus      type 2   ??? Thyroid disease      hypothyroidism   ??? Sleep apnea        Past Surgical History   Procedure Laterality Date   ??? Septoplasty     ??? Uvulopalatopharyngoplasty     ??? Hernia repair  9/09     right inguinal, mini maze   ??? Hernia repair  2/10     herniated lungs bilateral (repaired with mesh)   ??? Uvulopalatopharyngoplasty       has had esophageal dilations in the past (Dr.Decter)       [name]  reports that he has never smoked. He does not have any smokeless tobacco history on file. He reports that he does not drink alcohol or use illicit drugs.    Previous Medications    ASPIRIN 81 MG TAB    Take by mouth.      ATORVASTATIN (LIPITOR) 10 MG TABLET    Take 10 mg by mouth daily.    CARVEDILOL (COREG) 25 MG TABLET    Take 25 mg by mouth 2 (two) times daily.      GLUC/CHON-MSM#1/C/MANG/BOS/BOR (OSTEO BI-FLEX ORAL)    Take by mouth. OSTEO BI-FLEX JOINT SHIELD TABS     GLUCOSAMINE SULFATE 2KCL (GLUCOSAMINE SU 2KCL,  BULK,) 100 % POWD    by Misc.(Non-Drug; Combo Route) route.      GLYBURIDE-METFORMIN (GLUCOVANCE) 2.5-500 MG PER TABLET    Take 1 tablet by mouth 2 (two) times daily.      LEVOTHYROXINE (SYNTHROID, LEVOTHROID) 100 MCG TABLET    Take 100 mcg by mouth daily.      OMEPRAZOLE (PRILOSEC) 40 MG CAPSULE    Take 40 mg by mouth daily.      SAW PALMETTO XTR/ZINC PICOLIN (SAW PALMETTO EXTRACT ORAL)    Take by mouth daily. 1 by mouth daily     SILDENAFIL (VIAGRA) 100 MG TABLET    Take 100 mg by mouth daily as needed. 1/2 of a pill daily prn     SITAGLIPTAN (JANUVIA) 100 MG TABLET    Take 100 mg by mouth daily.    VALSARTAN-HYDROCHLOROTHIAZIDE (DIOVAN-HCT) 320-25 MG PER TABLET    Take 1 tablet by mouth daily.         Allergies:  Allergies as of 02/07/2013   ??? (No Known Allergies)       Review of Systems     ROS:  Please see HPI for pertinent review of systems. Otherwise, a comprehensive 10 system review of systems was performed and otherwise negative.    Physical Exam     ED Triage Vitals   Vital Signs Group      Temp 02/07/13 1054 98 ??F (36.7 ??C)      Temp Source 02/07/13 1054 Oral      Heart Rate 02/07/13 1054 69      Heart Rate Source 02/07/13 1054 Monitor      Resp 02/07/13 1054 18      BP 02/07/13 1054 147/81 mmHg      BP Location 02/07/13 1054 Right arm      BP Method 02/07/13 1054 Automatic      Patient Position 02/07/13 1054 Lying   SpO2 02/07/13 1054 98 %   O2 Device 02/07/13 1054 None (Room air)       General: Alert. No acute distress.    HEENT: Normocephalic.  Atraumatic.  Moist mucous membranes. PERRLA.  EOMI.  oropharynx pink and moist.    Neck: No restriction of motion noted. No C-spine tenderness.  Trachea midline.    Back: No CVA tenderness. The T and L spines were not tender to palpation.    Chest: Tenderness to palpation in the right upper chest wall in the anterior axillary line over the 3rd and 4th ribs.  No obvious crepitus or deformity.    Respiratory: No increased work of breathing. Lungs were clear to  auscultation bilaterally. No adventitious lung sounds heard.    Cardiovascular: Warm and well perfused.  Regular rhythm and rate.  No murmurs rubs or gallops.    Abdomen: The belly was soft. Not tender.  No rebound, guarding, or rigidity.  No organomegaly of note.    Skin: No rash appreciated.    Neurologic: Alert.  Answering questions appropriately. Moving all four extremities.  Intact sensation throughout.    MS:  Full range of motion.  No obvious deformity.  Negative logroll to the bilateral lower extremities.  Pelvis is stable.        Diagnostic Studies     Radiology:    CT scan of the chest initially read no acute abnormalities, but after reviewing imaging myself and discussed with the radiologist, there does appear to be multiple right-sided rib fractures.      Emergency Department Procedures         ED Course and MDM     [name] is a 72 y.o. male who presented to the emergency department with Fall      The patient presents to the emergency department today after a fall from 5-6 feet.  Vital signs initially quite stable and he was complaining only of right upper chest wall pain.  Breath sounds were clear bilaterally.  Chest wall tenderness.  High suspicion for rib fractures and it does appear that that is the etiology of his symptoms.  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  No pneumothorax.  He was able to pull 1500 mL's with incentive spirometry, but given the multiple risk factors and his age I did call a trauma alert.  Trauma to come to evaluate the patient has agreed to admit for pain control.    Disposition: Admission to trauma    Critical Care Time (Attendings)  Sharol Harness, MD  02/07/13 1430

## 2013-02-07 NOTE — Unmapped (Signed)
According to patient,  I fell 6 feet off a ladder onto my back. My right ribs hurt. Denies LOC.

## 2013-02-07 NOTE — Unmapped (Signed)
MD at bedside.

## 2013-02-07 NOTE — Unmapped (Signed)
Problem: Inadequate Airway Clearance  Goal: Patient will maintain patent airway  Assess and monitor breath sounds, cough and sputum (if present), and intake/output. Collaborate with respiratory therapy to administer medications and treatments.   Outcome: Progressing  Incentive Spirometry ordered to assist in lung expansion and improve oxygenation.

## 2013-02-07 NOTE — Other (Unsigned)
[facility]                                                        [facility] ED Note    Date of Service: 02/07/2013    Reason for Visit: Fall      Patient History    HPI:  Carl Blair is a 72 y.o. male with a past medical history of atrial  fibrillation, hyperlipidemia, hypertension, and diabetes here today after a   fall    The patient states that he was working on a ladder when he lost his balance   and  fell approximately 5-6 feet.  He does not think he struck his head.  He did   not  lose consciousness.  He has no neck or back pain.  He is complaining of right  upper chest wall pain.  This does make her mildly short of breath.  He denies  any abdominal pain.  No lower extremity pain.  He has been able to ambulate  without difficulty.  He states his pain is currently 6/10 and is worse with  taking a deep breath and moving his right upper extremity.      Past Medical History  Diagnosis Date   Atrial fibrillation   Hypercholesteremia   Hypertension   Dilated cardiomyopathy   Diabetes mellitus    type 2   Thyroid disease    hypothyroidism   Sleep apnea      Past Surgical History  Procedure Laterality Date   Septoplasty   Uvulopalatopharyngoplasty   Hernia repair  9/09    right inguinal, mini maze   Hernia repair  2/10    herniated lungs bilateral (repaired with mesh)   Uvulopalatopharyngoplasty    has had esophageal dilations in the past (Dr.Decter)      Carl Blair  reports that he has never smoked. He does not have any  smokeless tobacco history on file. He reports that he does not drink alcohol   or  use illicit drugs.    Previous Medications   ASPIRIN 81 MG TAB    Take by mouth.   ATORVASTATIN (LIPITOR) 10 MG TABLET    Take 10 mg by mouth daily.   CARVEDILOL (COREG) 25 MG TABLET    Take 25 mg by mouth 2 (two) times daily.   GLUC/CHON-MSM#1/C/MANG/BOS/BOR (OSTEO BI-FLEX ORAL)    Take by mouth. OSTEO  BI-FLEX JOINT SHIELD TABS   GLUCOSAMINE SULFATE 2KCL (GLUCOSAMINE SU 2KCL, BULK,) 100 % POWD     by  Misc.(Non-Drug; Combo Route) route.   GLYBURIDE-METFORMIN (GLUCOVANCE) 2.5-500 MG PER TABLET    Take 1 tablet by  mouth 2 (two) times daily.   LEVOTHYROXINE (SYNTHROID, LEVOTHROID) 100 MCG TABLET    Take 100 mcg by mouth  daily.   OMEPRAZOLE (PRILOSEC) 40 MG CAPSULE    Take 40 mg by mouth daily.   SAW PALMETTO XTR/ZINC PICOLIN (SAW PALMETTO EXTRACT ORAL)    Take by mouth  daily. 1 by mouth daily   SILDENAFIL (VIAGRA) 100 MG TABLET    Take 100 mg by mouth daily as needed.   1/2  of a pill daily prn   SITAGLIPTAN (JANUVIA) 100 MG TABLET    Take 100 mg by mouth daily.   VALSARTAN-HYDROCHLOROTHIAZIDE (DIOVAN-HCT) 320-25 MG PER TABLET  Take 1  tablet by mouth daily.      Allergies:  Allergies as of 02/07/2013   (No Known Allergies)      Review of Systems    ROS:  Please see HPI for pertinent review of systems. Otherwise, a   comprehensive  10 system review of systems was performed and otherwise negative.    Physical Exam    ED Triage Vitals  Vital Signs Group     Temp 02/07/13 1054 98 deg F (36.7 deg C)     Temp Source 02/07/13 1054 Oral     Heart Rate 02/07/13 1054 69     Heart Rate Source 02/07/13 1054 Monitor     Resp 02/07/13 1054 18     BP 02/07/13 1054 147/81 mmHg     BP Location 02/07/13 1054 Right arm     BP Method 02/07/13 1054 Automatic     Patient Position 02/07/13 1054 Lying  SpO2 02/07/13 1054 98 %  O2 Device 02/07/13 1054 None (Room air)      General: Alert. No acute distress.    HEENT: Normocephalic.  Atraumatic.  Moist mucous membranes. PERRLA.  EOMI.  oropharynx pink and moist.    Neck: No restriction of motion noted. No C-spine tenderness.  Trachea midline.    Back: No CVA tenderness. The T and L spines were not tender to palpation.    Chest: Tenderness to palpation in the right upper chest wall in the anterior  axillary line over the 3rd and 4th ribs.  No obvious crepitus or deformity.    Respiratory: No increased work of breathing. Lungs were clear to auscultation  bilaterally. No adventitious  lung sounds heard.    Cardiovascular: Warm and well perfused.  Regular rhythm and rate.  No murmurs  rubs or gallops.    Abdomen: The belly was soft. Not tender.  No rebound, guarding, or rigidity.    No  organomegaly of note.    Skin: No rash appreciated.    Neurologic: Alert.  Answering questions appropriately. Moving all four  extremities.  Intact sensation throughout.    MS:  Full range of motion.  No obvious deformity.  Negative logroll to the  bilateral lower extremities.  Pelvis is stable.        Diagnostic Studies    Radiology:    CT scan of the chest initially read no acute abnormalities, but after   reviewing  imaging myself and discussed with the radiologist, there does appear to be  multiple right-sided rib fractures.      Emergency Department Procedures        ED Course and MDM    Carl GlassDonald R Blair is a 72 y.o. male who presented to the emergency department  with Fall      The patient presents to the emergency department today after a fall from 5-6  feet.  Vital signs initially quite stable and he was complaining only of right  upper chest wall pain.  Breath sounds were clear bilaterally.  Chest wall  tenderness.  High suspicion for rib fractures and it does appear that that is  the etiology of his symptoms.  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  No pneumothorax.  He was able to pull   1500  mL's with incentive spirometry, but given the multiple risk factors and his   age  I did call a trauma alert.  Trauma to come to evaluate the patient has  agreed   to  admit for pain control.    Disposition: Admission to trauma    Critical Care Time (Attendings)        Sharol Harness, MD  02/07/13 1430

## 2013-02-08 MED ORDER — ibuprofen (ADVIL,MOTRIN) 800 MG tablet
800 | ORAL_TABLET | Freq: Three times a day (TID) | ORAL | Status: AC | PRN
Start: 2013-02-08 — End: 2013-02-20

## 2013-02-08 MED ORDER — acetaminophen (TYLENOL) 500 MG tablet
500 | ORAL_TABLET | Freq: Three times a day (TID) | ORAL | 0.00 refills | 11.00000 days | Status: AC
Start: 2013-02-08 — End: 2017-06-19

## 2013-02-08 MED ORDER — senna-docusate (SENNA-S) 8.6-50 mg per tablet
8.6-50 | ORAL_TABLET | Freq: Two times a day (BID) | ORAL | Status: AC
Start: 2013-02-08 — End: 2014-02-12

## 2013-02-08 MED ORDER — senna-docusate (SENNA-S) 8.6-50 mg per tablet 1 tablet
8.6-50 | Freq: Two times a day (BID) | ORAL | Status: AC
Start: 2013-02-08 — End: 2013-02-08
  Administered 2013-02-08: 13:00:00 via ORAL

## 2013-02-08 MED ORDER — oxyCODONE (ROXICODONE) 5 MG immediate release tablet
5 | ORAL_TABLET | ORAL | 0.00 refills | 6.00000 days | Status: AC | PRN
Start: 2013-02-08 — End: 2014-02-12

## 2013-02-08 MED FILL — METFORMIN 500 MG TABLET: 500 500 MG | ORAL | Qty: 1

## 2013-02-08 MED FILL — LOVENOX 30 MG/0.3 ML SUBCUTANEOUS SYRINGE: 30 30 mg/0.3 mL | SUBCUTANEOUS | Qty: 0.3

## 2013-02-08 MED FILL — HYDROCHLOROTHIAZIDE 25 MG TABLET: 25 25 MG | ORAL | Qty: 1

## 2013-02-08 MED FILL — TYLENOL EXTRA STRENGTH 500 MG TABLET: 500 500 mg | ORAL | Qty: 2

## 2013-02-08 MED FILL — DIOVAN 160 MG TABLET: 160 160 mg | ORAL | Qty: 2

## 2013-02-08 MED FILL — OXYCODONE 5 MG TABLET: 5 5 MG | ORAL | Qty: 2

## 2013-02-08 MED FILL — SENNA-S 8.6 MG-50 MG TABLET: 8.6-50 8.6-50 mg | ORAL | Qty: 1

## 2013-02-08 MED FILL — SYNTHROID 100 MCG TABLET: 100 100 mcg | ORAL | Qty: 1

## 2013-02-08 MED FILL — CARVEDILOL 12.5 MG TABLET: 12.5 12.5 MG | ORAL | Qty: 2

## 2013-02-08 MED FILL — ASPIRIN 81 MG TABLET,DELAYED RELEASE: 81 81 MG | ORAL | Qty: 1

## 2013-02-08 MED FILL — JANUVIA 100 MG TABLET: 100 100 mg | ORAL | Qty: 1

## 2013-02-08 MED FILL — GLYBURIDE 5 MG TABLET: 5 5 MG | ORAL | Qty: 1

## 2013-02-08 MED FILL — PANTOPRAZOLE 40 MG TABLET,DELAYED RELEASE: 40 40 MG | ORAL | Qty: 1

## 2013-02-08 NOTE — Unmapped (Signed)
[place]   Social Work Psychosocial Assessment     [name]  [id number]  72 y.o.  male  White or Caucasian       Multiple rib fractures [807.09]    Referred by: Dr. Bridgette Habermann  Referred Reason: dc planning    History    Past Medical History   Diagnosis Date   ??? Atrial fibrillation    ??? Hypercholesteremia    ??? Hypertension    ??? Dilated cardiomyopathy    ??? Diabetes mellitus      type 2   ??? Thyroid disease      hypothyroidism   ??? Sleep apnea          Assessment/Plan    Patient dc'ed prior to me meeting with him.  Chart review done, PT recommended 24/7 SA.  Placed call to [facility] NP with Trauma team.  She states patient did not have any dc needs.  No further measures taken at this time.    Harvie Bridge RN  Case Manager  [phone] ascom  [phone] office

## 2013-02-08 NOTE — Unmapped (Addendum)
Oxycodone tablets or capsules  What is this medicine?  OXYCODONE (ox i KOE done) is a pain reliever. It is used to treat moderate to severe pain.  This medicine may be used for other purposes; ask your health care provider or pharmacist if you have questions.  COMMON BRAND NAME(S): Dazidox , Endocodone , OXECTA, OxyIR, Percolone, Roxicodone  What should I tell my health care provider before I take this medicine?  They need to know if you have any of these conditions:  -Addison's disease  -brain tumor  -drug abuse or addiction  -head injury  -heart disease  -if you frequently drink alcohol containing drinks  -kidney disease or problems going to the bathroom  -liver disease  -lung disease, asthma, or breathing problems  -mental problems  -an unusual or allergic reaction to oxycodone, codeine, hydrocodone, morphine, other medicines, foods, dyes, or preservatives  -pregnant or trying to get pregnant  -breast-feeding  How should I use this medicine?  Take this medicine by mouth with a glass of water. Follow the directions on the prescription label. You can take it with or without food. If it upsets your stomach, take it with food. Take your medicine at regular intervals. Do not take it more often than directed. Do not stop taking except on your doctor's advice.  Some brands of this medicine, like Oxecta, have special instructions. Ask your doctor or pharmacist if these directions are for you: Do not cut, crush or chew this medicine. Swallow only one tablet at a time. Do not wet, soak, or lick the tablet before you take it.  Talk to your pediatrician regarding the use of this medicine in children. Special care may be needed.  Overdosage: If you think you have taken too much of this medicine contact a poison control center or emergency room at once.  NOTE: This medicine is only for you. Do not share this medicine with others.  What if I miss a dose?  If you miss a dose, take it as soon as you can. If it is almost time for  your next dose, take only that dose. Do not take double or extra doses.  What may interact with this medicine?  -alcohol  -antihistamines  -certain medicines used for nausea like chlorpromazine, droperidol  -erythromycin  -ketoconazole  -medicines for depression, anxiety, or psychotic disturbances  -medicines for sleep  -muscle relaxants  -naloxone  -naltrexone  -narcotic medicines (opiates) for pain  -nilotinib  -phenobarbital  -phenytoin  -rifampin  -ritonavir  -voriconazole  This list may not describe all possible interactions. Give your health care provider a list of all the medicines, herbs, non-prescription drugs, or dietary supplements you use. Also tell them if you smoke, drink alcohol, or use illegal drugs. Some items may interact with your medicine.  What should I watch for while using this medicine?  Tell your doctor or health care professional if your pain does not go away, if it gets worse, or if you have new or a different type of pain. You may develop tolerance to the medicine. Tolerance means that you will need a higher dose of the medicine for pain relief. Tolerance is normal and is expected if you take this medicine for a long time.  Do not suddenly stop taking your medicine because you may develop a severe reaction. Your body becomes used to the medicine. This does NOT mean you are addicted. Addiction is a behavior related to getting and using a drug for a non-medical reason. If   you have pain, you have a medical reason to take pain medicine. Your doctor will tell you how much medicine to take. If your doctor wants you to stop the medicine, the dose will be slowly lowered over time to avoid any side effects.  You may get drowsy or dizzy when you first start taking this medicine or change doses. Do not drive, use machinery, or do anything that may be dangerous until you know how the medicine affects you. Stand or sit up slowly.  There are different types of narcotic medicines (opiates) for pain. If  you take more than one type at the same time, you may have more side effects. Give your health care provider a list of all medicines you use. Your doctor will tell you how much medicine to take. Do not take more medicine than directed. Call emergency for help if you have problems breathing.  This medicine will cause constipation. Try to have a bowel movement at least every 2 to 3 days. If you do not have a bowel movement for 3 days, call your doctor or health care professional.  Your mouth may get dry. Drinking water, chewing sugarless gum, or sucking on hard candy may help. See your dentist every 6 months.  What side effects may I notice from receiving this medicine?  Side effects that you should report to your doctor or health care professional as soon as possible:  -allergic reactions like skin rash, itching or hives, swelling of the face, lips, or tongue  -breathing problems  -confusion  -feeling faint or lightheaded, falls  -trouble passing urine or change in the amount of urine  -unusually weak or tired  Side effects that usually do not require medical attention (report to your doctor or health care professional if they continue or are bothersome):  -constipation  -dry mouth  -itching  -nausea, vomiting  -upset stomach  This list may not describe all possible side effects. Call your doctor for medical advice about side effects. You may report side effects to FDA at 1-800-FDA-1088.  Where should I keep my medicine?  Keep out of the reach of children. This medicine can be abused. Keep your medicine in a safe place to protect it from theft. Do not share this medicine with anyone. Selling or giving away this medicine is dangerous and against the law.  Store at room temperature between 15 and 30 degrees C (59 and 86 degrees F). Protect from light. Keep container tightly closed.   Discard unused medicine and used packaging carefully. Pets and children can be harmed if they find used or lost packages. Flush any  unused medicines down the toilet. Do not use the medicine after the expiration date.  NOTE: This sheet is a summary. It may not cover all possible information. If you have questions about this medicine, talk to your doctor, pharmacist, or health care provider.  ?? 2013, Elsevier/Gold Standard. (02/24/2011 8:33:37 AM)    Senna tablets  What is this medicine?  SENNA (SEN uh) is a laxative. This medicine is used to relieve constipation. It may also be used to empty and prepare the bowel for surgery or examination.  This medicine may be used for other purposes; ask your health care provider or pharmacist if you have questions.  COMMON BRAND NAME(S): [address] , Ex-Lax, Lax-Pills, Perdiem, Senexon, Senna, Senna-Lax , SennaGen , Sennatural, Senokot Xtra, Senokot, SenoSol, SenoSol-X, Uni-Cenna  What should I tell my health care provider before I take this medicine?  They  need to know if you have any of these conditions  -appendicitis  -stomach pain or blockage  -vomiting  -an unusual or allergic reaction to senna, other medicines, foods, dyes, or preservatives  -pregnant or trying to get pregnant  -breast-feeding  How should I use this medicine?  Take this medicine by mouth with a full glass of water. Follow the directions on the package. Always take with plenty of water. Take exactly as directed. Do not take your medicine more often than directed.  Talk to your pediatrician regarding the use of this medicine in children. While this medicine may be prescribed for children as young as 2 years for selected conditions, precautions do apply.  Overdosage: If you think you have taken too much of this medicine contact a poison control center or emergency room at once.  NOTE: This medicine is only for you. Do not share this medicine with others.  What if I miss a dose?  If you miss a dose, take it as soon as you can. If it is almost time for your next dose, take only that dose. Do not take double or extra doses.  What may  interact with this medicine?  Interactions are not expected. However, this medicine may affect the time other medicines stay in the stomach. It is best not to take this medicine within 1 to 2 hours of taking other medicines.  This list may not describe all possible interactions. Give your health care provider a list of all the medicines, herbs, non-prescription drugs, or dietary supplements you use. Also tell them if you smoke, drink alcohol, or use illegal drugs. Some items may interact with your medicine.  What should I watch for while using this medicine?  Do not use this medicine for longer than directed by your doctor or health care professional. This medicine can be habit-forming. Long-term use can make your body depend on the laxative for regular bowel movements, damage the bowel, cause malnutrition, and problems with the amounts of water and salts in your body. If your constipation keeps returning, check with your doctor or health care professional.  What side effects may I notice from receiving this medicine?  Side effects that you should report to your doctor or health care professional as soon as possible:  -diarrhea  -muscle weakness  -nausea, vomiting  -unusual weight loss  Side effects that usually do not require medical attention (report to your doctor or health care professional if they continue or are bothersome):  -bloating  -discolored urine  -lower stomach discomfort or cramps  This list may not describe all possible side effects. Call your doctor for medical advice about side effects. You may report side effects to FDA at 1-800-FDA-1088.  Where should I keep my medicine?  Keep out of the reach of children.  Store at room temperature between 15 and 30 degrees C (59 and 86 degrees F). Keep container tightly closed. Throw away any unused medicine after the expiration date.  NOTE: This sheet is a summary. It may not cover all possible information. If you have questions about this medicine, talk to  your doctor, pharmacist, or health care provider.  ?? 2013, Elsevier/Gold Standard. (11/28/2007 3:49:10 PM)    Rib Fracture  Your caregiver has diagnosed you as having a rib fracture (a break). This can occur by a blow to the chest, by a fall against a hard object, or by violent coughing or sneezing. There may be one or many breaks. Rib fractures may heal  on their own within 3 to 8 weeks. The longer healing period is usually associated with a continued cough or other aggravating activities.  HOME CARE INSTRUCTIONS   ?? Avoid strenuous activity. Be careful during activities and avoid bumping the injured rib. Activities that cause pain pull on the fracture site(s) and are best avoided if possible.  ?? Eat a normal, well-balanced diet. Drink plenty of fluids to avoid constipation.  ?? Take deep breaths several times a day to keep lungs free of infection. Try to cough several times a day, splinting the injured area with a pillow. This will help prevent pneumonia.  ?? Do not wear a rib belt or binder. These restrict breathing which can lead to pneumonia.  ?? Only take over-the-counter or prescription medicines for pain, discomfort, or fever as directed by your caregiver.  SEEK MEDICAL CARE IF:   You develop a continual cough, associated with thick or bloody sputum.  SEEK IMMEDIATE MEDICAL CARE IF:   ?? You have a fever.  ?? You have difficulty breathing.  ?? You have nausea (feeling sick to your stomach), vomiting, or abdominal (belly) pain.  ?? You have worsening pain, not controlled with medications.  Document Released: 03/28/2005 Document Revised: 06/20/2011 Document Reviewed: 08/30/2006  ExitCare?? Patient Information ??2014 [place], [place].    [facility] TRAUMA SERVICE DISCHARGE INSTRUCTIONS - HOME    Trauma Hotline:   [phone]  Trauma Fax:  [phone]    *For questions please call the Trauma Hotline and leave a message.*  If your call is between the hours of 8:00 AM - 4:30 PM (Monday to Friday) your call will be returned the same  day; otherwise your call will be returned the next day by the Trauma team.    For emergent problems after hours or on the weekend, please call the [facility] at [phone] and ask for the Trauma Surgeon on call or return to an Emergency Department.    Please call the Trauma Hotline [phone] or return to an Emergency Department for:  1) Fever greater than 101.5??                                         2) Persistent nausea & vomiting                               3) Shortness of breath  4) Increasing unrelieved pain  5) Foul smelling drainage from wounds/surgical sites  6) Increasing redness, swelling, or warmth at wound/surgical sites.    FOLLOW UP APPOINTMENTS:  Future Appointments  Date Time Provider Department Center   02/20/2013 9:15 AM [facility] Provider [facility]     [facility] General Surgery  [address], [address]  [place] [address]  [phone]    November 12 at 9:15 am        INCIDENTAL FINDINGS:  None      MECHANISM OF INJURY:   Date of Injury: fall, rib fractures 10/30    INJURIES: rib fractures    DIET:  Regular    ACTIVITY:      Walk and get out of bed frequently  RESTRICTIONS:   None    PATIENT/FAMILY TEACHING:  Return to work/school when ready, no driving or operating heavy machinery while taking narcotics.   Please use incentive spirometer 10 breathes 3 times per day for 2 weeks.     MEDICATIONS:     Do not take additional Acetaminophen (Tylenol) products while taking combination medications Oxycodone/APAP (Percocets) or Hydrocodone/APAP (Lortab, Vicodin, Norco).  OK to take Acetaminophen (Tylenol) when taking plain Oxycodone (Roxicodone).  Do not exceed 3000 mg Acetaminophen (Tylenol) in 24 hours.                             *Take pain medication as prescribed. Do not drink alcohol, drive or operate heavy machinery while on narcotics.    DISCHARGE:   Home

## 2013-02-08 NOTE — Unmapped (Signed)
Physical Therapy  Physical Therapy Initial Assessment/Discharge     Name: [name]  DOB: 09/19/1940  Attending Physician: Trish Mage, MD  Admission Diagnosis: Multiple rib fractures [807.09]  Date: 02/08/2013  Precautions: falls  Reviewed Pertinent hospital course: Yes  Hospital Course: Pt is a 72yo male admitted after falling off 85ft ladder.  Dx:  2, 5-6 R sided rib fxs.  Assessment  Prognosis: Good  Goals  Time frame for goals to be met in: 1 time visit   Recommendation  Plan  PT Frequency: One time visit    Recommendation  Recommendation: 24 hour supervision/assist  Equipment Recommended: None  Problem List  Patient Active Problem List   Diagnosis   ??? Insect Bite NEC   ??? Hemangioma of Intra-Abdominal Structures   ??? Other Specified Disorders of Liver   ??? Malignant Neoplasm of Head of Pancreas   ??? Unspecified Disorder of Esophagus   ??? Unspecified chest pain   ??? Contact dermatitis and other eczema, due to unspecified cause   ??? Morbid obesity   ??? Obstructive sleep apnea (adult) (pediatric)   ??? Unspecified Hypothyroidism   ??? DM w/o Complication Type II   ??? Other and Unspecified Hyperlipidemia   ??? Unspecified Essential Hypertension   ??? Atrial Fibrillation   ??? Congestive Heart Failure, Unspecified   ??? Unspecified Hemorrhoids without Mention of Complication   ??? Esophageal reflux   ??? Pain in Soft Tissues of Limb   ??? Special Screening for Malignant Neoplasm of Prostate   ??? Multiple rib fractures      Past Medical History  Past Medical History   Diagnosis Date   ??? Atrial fibrillation    ??? Hypercholesteremia    ??? Hypertension    ??? Dilated cardiomyopathy    ??? Diabetes mellitus      type 2   ??? Thyroid disease      hypothyroidism   ??? Sleep apnea       Past Surgical History  Past Surgical History   Procedure Laterality Date   ??? Septoplasty     ??? Uvulopalatopharyngoplasty     ??? Hernia repair  9/09     right inguinal, mini maze   ??? Hernia repair  2/10     herniated lungs bilateral (repaired with mesh)   ???  Uvulopalatopharyngoplasty       has had esophageal dilations in the past (Dr.Decter)     Patient Stated Goals  Goal #1: To go home   Home Living/Prior Function  Type of Home: House (Tri level house)  Home Layout: Multi-level;Bed/bath upstairs;Stairs to enter without rails (1 STE, 13 steps w/1 rail to upstairs)  Home Equipment: Agricultural consultant;Wheelchair-manual (adjustable bed)  Level of Independence: Independent  Lives With: Spouse (24/7 physical assist)  Receives Help From: Family     Pain  Pain Score: 0-No pain    Vision  Current Vision: Wears glasses only for reading    Cognition  Overall Cognitive Status: Within Functional Limits    Sensation  Light Touch: No apparent deficits  Proprioception: No apparent deficits  Inattention/Neglect: Appears intact  Initiation: Appears intact  Motor Planning: Appears intact  Perseveration: Not present    Lower Extremity  RLE Assessment  RLE Assessment: Within Functional Limits  LLE Assessment  LLE Assessment: Within Functional Limits  Functional Mobility  Bed Mobility Eval  Rolling: Supervision (via log roll to L EOB)  Supine to Sit: Supervision  Sit to Supine: Supervision  Transfers Eval  Sit to  Stand: Supervision  Gait Eval  Gait Assistance Eval: Supervision  Assistive Device Eval: None  Distance Eval: 321ft (O2 sat on RA 98%, 1 LOB requiring stepping strategy due SOB and dizziness)  Stair Management Technique Eval: One rail R;Forwards;Alternating pattern  Stair Management Assistance Eval: Modified independent (Device)  Number of Stairs Eval: 12  Balance Eval  Sitting - Static: Independent  Sitting-Dynamic: Independent   Standing-Static: Independent  Standing-Dynamic: Supervision     Patient Education   Role of PT, POC and goals  Time  Start Time: 1011  Stop Time: 1040  Time Calculation (min): 29 min    G Code   PT Mobility Walking/Moving Current Status with CI Modifier  PT Mobility Walking/Moving Goal Status with CI Modifier  PT Mobility Walking/Moving Discharge Status with  CI Modifier   - evaluated with clinical judgement/functional assessment    Charges   $Initial PT Evaluation: 1 Procedure  Pt sitting in chair at end of session w/call light and phone within reach.   Melina Copa, PT, DPT [phone]  Pager [phone]  02/08/2013

## 2013-02-08 NOTE — Unmapped (Signed)
TRAUMA SURGERY PROGRESS NOTE  Admit Date: 02/07/2013      LOS: 1 day     Subjective:   Admitted yesterday after a 6 ft fall with right sided rib fractures, po pain meds, using is    Objective:   Vitals:  Temp:  [97.8 ??F (36.6 ??C)-98.8 ??F (37.1 ??C)] 97.8 ??F (36.6 ??C)  Heart Rate:  [68-75] 71  Resp:  [16-20] 16  BP: (131-147)/(69-83) 137/79 mmHg  FiO2:  [21 %] 21 %  Filed Vitals:    02/08/13 1055   BP: 137/79   Pulse: 71   Temp: 97.8 ??F (36.6 ??C)   Resp: 16   SpO2: 95%       Date 02/07/13 0700 - 02/08/13 0659 02/08/13 0700 - 02/09/13 0659   Shift 0700-1459 1500-2259 2300-0659 24 Hour Total 0700-1459 1500-2259 2300-0659 24 Hour Total   I  N  T  A  K  E   P.O.  480  480          P.O.  480  480        Shift Total  (mL/kg)  480  (4)  480  (4)       O  U  T  P  U  T   Urine  (mL/kg/hr)  400  (0.4)  400  (0.1)          Urine  400  400        Shift Total  (mL/kg)  400  (3.3)  400  (3.3)       Weight (kg) 121 120.7 120.7 120.7 120.7 120.7 120.7 120.7       Physical Exam:  Gen: Cooperative, no acute distress  Neuro: Alert and oriented    Eyes: 4  Verbal: 5  Motor: 6   GCS: 15  HEENT: NCAT, PERRL, neck supple  CV: Regular rate and rhythm, normal S1 and S2  Resp: CTAB, no respiratory distress, 1.7 liter volumes on IS  Abd: Soft, non-distended, non-tender, no masses  Ext: Warm and well perfused    Current Medications:  Scheduled Medications:    acetaminophen 1,000 mg TID   aspirin 81 mg Daily 0900   atorvastatin 10 mg Nightly (2100)   carvedilol 25 mg BID WC   enoxaparin 30 mg Q12H SCH   glyBURIDE 2.5 mg BID WC   hydrochlorothiazide 25 mg Daily 0900   levothyroxine 100 mcg DAILY 0600   metFORMIN 500 mg BID WC   pantoprazole 40 mg DAILY 0600   senna-docusate 1 tablet BID   sitagliptan 100 mg QAM   valsartan 320 mg Daily 0900      PRN Medications:    HYDROmorphone 0.5 mg Q2H PRN   Or     HYDROmorphone 1 mg Q2H PRN   ondansetron 4 mg Q6H PRN   oxyCODONE 5 mg Q4H PRN   Or     oxyCODONE 10 mg Q4H PRN       Imaging:  X-ray Chest  Pa/l    02/07/2013   Chest PA and lateral  There is pleural thickening along the lateral aspect of the right lower hemithorax measures at least 1 cm in thickness. Atelectatic changes or scarring is noted in the right lower lobe. The left lung is clear.  IMPRESSION  Pleural thickening in the right lower hemithorax laterally. Scarring or atelectasis in the lower lobe.  Report Verified by: Roney Jaffe, MD at 02/07/2013 11:21 AM    Ct Chest With Iv Contrast  02/07/2013   Addendum:  There is a mildly displaced fracture seen involving the anterior aspect of  the second rib on the right. Mildly displaced lateral rib fractures are  also noted involving the fifth and sixth ribs on the right. There is also  questionable nondisplaced fracture of the right clavicle. This was  discussed with Vela Prose by telephone at 1:10 PM on 02/07/2013.  Report Verified by: Mosie Lukes, MD at 02/07/2013 1:23 PM    02/07/2013   CT CHEST, ABDOMEN AND PELVIS WITH CONTRAST  HISTORY: Trauma  Comparison: 03/11/2010 and 02/04/2009  TECHNICAL FACTORS: Following administration of IV contrast axial imaging was performed of the chest, abdomen and pelvis.    CT CHEST:  Bibasilar parenchymal scarring is seen with bilateral intercostal lung herniations present which were likewise seen on prior study but appears slightly more prominent. There is no gross consolidation, effusion, or pneumothorax seen.  No suspicious parenchymal masses are identified.  No mediastinal or hilar adenopathy is identified. No axillary adenopathy is identified.  Coronary calcifications are present.       IMPRESSION:  Bilateral anterior intercostal lung herniations which are likewise noted on prior study and may be related to prior surgery. However these appear slightly more prominent than prior examination.  Mild bibasilar atelectatic changes or progressive scarring.  No gross infiltrate or effusion seen.     CT ABDOMEN:       The liver shows uniform attenuation  with no focal mass lesion or significant biliary ductal dilation.  Gall bladder shows no definite stones or wall thickening.  Spleen is grossly within normal limits.  Pancreas shows no gross mass or inflammatory changes.  Adrenal glands show no gross mass.  Kidneys enhance uniformly with no evidence of suspicious mass or hydronephrosis.  No retroperitoneal adenopathy is seen.  Bowel gas pattern is nonspecific with no evidence of obstruction or free air seen.  Appendix is normal.  CT PELVIS:  No free fluid is identified.  No evidence of abnormal mass or adenopathy is seen.  No evidence of acute inflammatory process is seen.       IMPRESSION:  No evidence of acute abdominal or pelvic pathology identified.         Report Verified by: Mosie Lukes, MD at 02/07/2013 1:00 PM    Ct Abdomen And Pelvis With Iv Contrast    02/07/2013   Addendum:  There is a mildly displaced fracture seen involving the anterior aspect of  the second rib on the right. Mildly displaced lateral rib fractures are  also noted involving the fifth and sixth ribs on the right. There is also  questionable nondisplaced fracture of the right clavicle. This was  discussed with Vela Prose by telephone at 1:10 PM on 02/07/2013.  Report Verified by: Mosie Lukes, MD at 02/07/2013 1:23 PM    02/07/2013   CT CHEST, ABDOMEN AND PELVIS WITH CONTRAST  HISTORY: Trauma  Comparison: 03/11/2010 and 02/04/2009  TECHNICAL FACTORS: Following administration of IV contrast axial imaging was performed of the chest, abdomen and pelvis.    CT CHEST:  Bibasilar parenchymal scarring is seen with bilateral intercostal lung herniations present which were likewise seen on prior study but appears slightly more prominent. There is no gross consolidation, effusion, or pneumothorax seen.  No suspicious parenchymal masses are identified.  No mediastinal or hilar adenopathy is identified. No axillary adenopathy is identified.  Coronary calcifications are present.        IMPRESSION:  Bilateral anterior intercostal  lung herniations which are likewise noted on prior study and may be related to prior surgery. However these appear slightly more prominent than prior examination.  Mild bibasilar atelectatic changes or progressive scarring.  No gross infiltrate or effusion seen.     CT ABDOMEN:       The liver shows uniform attenuation with no focal mass lesion or significant biliary ductal dilation.  Gall bladder shows no definite stones or wall thickening.  Spleen is grossly within normal limits.  Pancreas shows no gross mass or inflammatory changes.  Adrenal glands show no gross mass.  Kidneys enhance uniformly with no evidence of suspicious mass or hydronephrosis.  No retroperitoneal adenopathy is seen.  Bowel gas pattern is nonspecific with no evidence of obstruction or free air seen.  Appendix is normal.  CT PELVIS:  No free fluid is identified.  No evidence of abnormal mass or adenopathy is seen.  No evidence of acute inflammatory process is seen.       IMPRESSION:  No evidence of acute abdominal or pelvic pathology identified.         Report Verified by: Mosie Lukes, MD at 02/07/2013 1:00 PM        Assessment / Plan:   Devontre Siedschlag is a 72 y.o. male admitted 02/07/2013 s/p 6 foot fall and multiple right sided rib fractures    Rib Fractures: right 2, 5-6, RA  -multimodal pain relief  -IS  -OOB    DM:  -home glucophage and januvia    HLD:   -lipitor    HTN:  -HCTZ, coreg,diovan    Pain control: tylenol, oxycodone    Bowel regimen: senna s    DVT prophylaxis: lovenox    Disposition: Home today with IS and po pain meds    Payton Mccallum CNP   Division of Trauma and Critical Care   [facility]   Pager: [phone]  Academic Office [phone]

## 2013-02-08 NOTE — Unmapped (Signed)
Trauma Attending Attestation:    I have seen and examined the patient and agree with the note above except for any changes outlined below:    Injuries and Problems:  Multiple R Rib Fx    Doing well.   IS 1700.   Good pain control     CTAB    Planning DC to home.       Teola Bradley MD  [facility]  Division of Trauma, Surgical Critical Care, and Acute Care Surgery  Cell Phone: [phone]  [facility] Trauma/ACS  Cell: [phone]

## 2013-02-08 NOTE — Unmapped (Signed)
[facility] TRAUMA SERVICES DISCHARGE SUMMARY       Patient ID:  [name]  [id number]  [id number]  06-03-1940    Admit date: 02/07/2013  Discharge date and time: 02/08/2013 11:06 AM  Admitting Physician: Trish Mage, MD   Discharge Physician: Teola Bradley  Admission Condition: good  Discharged Condition: good    Indication for Admission:   Multiple rib fractures  Pulmonary Toilet  Pain Control  Primary Admission Diagnosis:   Multiple rib fractures [807.09]    Secondary Admission Diagnoses / Past Medical Problems:  Patient Active Problem List   Diagnosis   ??? Insect Bite NEC   ??? Hemangioma of Intra-Abdominal Structures   ??? Other Specified Disorders of Liver   ??? Malignant Neoplasm of Head of Pancreas   ??? Unspecified Disorder of Esophagus   ??? Unspecified chest pain   ??? Contact dermatitis and other eczema, due to unspecified cause   ??? Morbid obesity   ??? Obstructive sleep apnea (adult) (pediatric)   ??? Unspecified Hypothyroidism   ??? DM w/o Complication Type II   ??? Other and Unspecified Hyperlipidemia   ??? Unspecified Essential Hypertension   ??? Atrial Fibrillation   ??? Congestive Heart Failure, Unspecified   ??? Unspecified Hemorrhoids without Mention of Complication   ??? Esophageal reflux   ??? Pain in Soft Tissues of Limb   ??? Special Screening for Malignant Neoplasm of Prostate   ??? Multiple rib fractures       Discharge New Diagnoses/Injuries:   Rib Fractures    Operations/Procedures Performed:  1. none    Consultants:   None    Brief Hospital Course:   Patient was admitted on 10/30 for observation after a 6 foot fall from a ladder. He sustained multiple right sided rib fractures and was admitted for pain control and pulmonary toilet. Patient was able to control pain with PO medications. He additionally remained on RA with oxygen saturations greater than 94%. He was able to demonstrate adequate use of his IS and was deemed safe for discharge. He was counseled on IS use and home pain control. He was instructed to return to the ED for  any cough, SOB, fever, chills or decrease in IS volumes.      Disposition: Home with pain medications, IS and Trauma Clinic Follow up.      Patient Instructions:   Current Discharge Medication List      START taking these medications    Details   acetaminophen (TYLENOL) 500 MG tablet Take 2 tablets (1,000 mg total) by mouth 3 times a day.  Qty: 30 tablet, Refills: 2      ibuprofen (ADVIL,MOTRIN) 800 MG tablet Take 1 tablet (800 mg total) by mouth every 8 hours as needed.  Qty: 30 tablet, Refills: 0      oxyCODONE (ROXICODONE) 5 MG immediate release tablet Take 1 tablet (5 mg total) by mouth every 4 hours as needed.  Qty: 30 tablet, Refills: 0      senna-docusate (SENNA-S) 8.6-50 mg per tablet Take 1 tablet by mouth 2 times a day.  Qty: 30 tablet, Refills: 0         CONTINUE these medications which have NOT CHANGED    Details   aspirin 81 MG EC tablet Take 81 mg by mouth daily.      atorvastatin (LIPITOR) 10 MG tablet Take 10 mg by mouth at bedtime.       carvedilol (COREG) 25 MG tablet Take 25 mg by mouth 2 (  two) times daily.        GLUC/CHON-MSM#1/C/MANG/BOS/BOR (OSTEO BI-FLEX ORAL) Take by mouth. OSTEO BI-FLEX JOINT SHIELD TABS       glyBURIDE-metformin (GLUCOVANCE) 2.5-500 mg per tablet Take 2 tablets by mouth 2 times a day.       levothyroxine (SYNTHROID, LEVOTHROID) 100 MCG tablet Take 100 mcg by mouth every morning.       omeprazole (PRILOSEC) 40 MG capsule Take 40 mg by mouth every morning.       SAW PALMETTO XTR/ZINC PICOLIN (SAW PALMETTO EXTRACT ORAL) Take 2 tablets by mouth 2 times a day.       sitagliptan (JANUVIA) 100 MG tablet Take 100 mg by mouth every morning.       valsartan-hydrochlorothiazide (DIOVAN-HCT) 320-25 mg per tablet Take 1 tablet by mouth every morning.       sildenafil (VIAGRA) 100 MG tablet Take 100 mg by mouth daily as needed. 1/2 of a pill daily prn          STOP taking these medications       aspirin 81 mg Tab        GLUCOSAMINE SULFATE 2KCL (GLUCOSAMINE SU 2KCL, BULK,) 100 % Powd             Activity: activity as tolerated  Diet: regular diet  Wound Care: none needed        [facility] TRAUMA SERVICE DISCHARGE INSTRUCTIONS - HOME    Trauma Hotline:   [phone]  Trauma Fax:  [phone]    *For questions please call the Trauma Hotline and leave a message.*  If your call is between the hours of 8:00 AM - 4:30 PM (Monday to Friday) your call will be returned the same day; otherwise your call will be returned the next day by the Trauma team.    For emergent problems after hours or on the weekend, please call the [facility] at [phone] and ask for the Trauma Surgeon on call or return to an Emergency Department.    Please call the Trauma Hotline [phone] or return to an Emergency Department for:  1) Fever greater than 101.5??                                         2) Persistent nausea & vomiting                               3) Shortness of breath  4) Increasing unrelieved pain  5) Foul smelling drainage from wounds/surgical sites  6) Increasing redness, swelling, or warmth at wound/surgical sites.    FOLLOW UP APPOINTMENTS:  Future Appointments  Date Time Provider Department Center   02/20/2013 9:15 AM [facility] Provider [facility]     [facility]  [address]  [place] [address]  [phone]    November 12 at 9:15 am        INCIDENTAL FINDINGS:  None      MECHANISM OF INJURY:   Date of Injury: fall, rib fractures 10/30    INJURIES: rib fractures    DIET:  Regular    ACTIVITY:      Walk and get out of bed frequently  RESTRICTIONS:   None    PATIENT/FAMILY TEACHING:  Return to work/school when ready, no driving or operating heavy machinery while taking narcotics.   Please use incentive spirometer 10 breathes 3 times per day for 2 weeks.     MEDICATIONS:     Do not take additional Acetaminophen (Tylenol) products while taking combination medications Oxycodone/APAP (Percocets) or  Hydrocodone/APAP (Lortab, Vicodin, Norco).  OK to take Acetaminophen (Tylenol) when taking plain Oxycodone (Roxicodone).  Do not exceed 3000 mg Acetaminophen (Tylenol) in 24 hours.                             *Take pain medication as prescribed. Do not drink alcohol, drive or operate heavy machinery while on narcotics.    DISCHARGE:   Home               QUESTIONS OR PROBLEMS MAY BE ADDRESSED BY CALLING THE [facility] TRAUMA HOTLINE AT [phone]. THE TEAM WILL GET BACK TO YOU ASAP (HOT LINE ANSWERED M-F 9AM TO 5 PM)    Signed:  Payton Mccallum CNP   Division of Trauma and Critical Care   [facility]   Pager: [phone]  Academic Office [phone]

## 2013-02-20 ENCOUNTER — Ambulatory Visit: Admit: 2013-02-20 | Payer: MEDICARE | Attending: Trauma Surgery

## 2013-02-20 DIAGNOSIS — S2249XA Multiple fractures of ribs, unspecified side, initial encounter for closed fracture: Secondary | ICD-10-CM

## 2013-02-20 NOTE — Unmapped (Signed)
Chief Complaint:    Chief Complaint   Patient presents with   ??? Fall   ??? Multiple Rib Fx's       Subjective  HPI:   Patient ID: [name] is a 72 y.o. male.  HPI    S/p fall from ladder with 2nd, 4th, and 5th rib fractures.      Doing well.  Reports intermittent paroxysms of pain.  Stopped taking oxycodone because of constipation  Still taking tylenol and senna.  Uses his incentive spirometer daily.       Allergies  Review of patient's allergies indicates no known allergies.    Medications  Outpatient Encounter Prescriptions as of 02/20/2013   Medication Sig Dispense Refill   ??? acetaminophen (TYLENOL) 500 MG tablet Take 2 tablets (1,000 mg total) by mouth 3 times a day.  30 tablet  2   ??? aspirin 81 MG EC tablet Take 81 mg by mouth daily.       ??? atorvastatin (LIPITOR) 10 MG tablet Take 10 mg by mouth at bedtime.        ??? carvedilol (COREG) 25 MG tablet Take 25 mg by mouth 2 (two) times daily.         ??? GLUC/CHON-MSM#1/C/MANG/BOS/BOR (OSTEO BI-FLEX ORAL) Take by mouth. OSTEO BI-FLEX JOINT SHIELD TABS        ??? glyBURIDE-metformin (GLUCOVANCE) 2.5-500 mg per tablet Take 2 tablets by mouth 2 times a day.        ??? levothyroxine (SYNTHROID, LEVOTHROID) 100 MCG tablet Take 100 mcg by mouth every morning.        ??? omeprazole (PRILOSEC) 40 MG capsule Take 40 mg by mouth every morning.        ??? SAW PALMETTO XTR/ZINC PICOLIN (SAW PALMETTO EXTRACT ORAL) Take 2 tablets by mouth 2 times a day.        ??? senna-docusate (SENNA-S) 8.6-50 mg per tablet Take 1 tablet by mouth 2 times a day.  30 tablet  0   ??? valsartan-hydrochlorothiazide (DIOVAN-HCT) 320-25 mg per tablet Take 1 tablet by mouth every morning.        ??? oxyCODONE (ROXICODONE) 5 MG immediate release tablet Take 1 tablet (5 mg total) by mouth every 4 hours as needed.  30 tablet  0   ??? sitagliptan (JANUVIA) 100 MG tablet Take 100 mg by mouth every morning.        ??? [DISCONTINUED] ibuprofen (ADVIL,MOTRIN) 800 MG tablet Take 1 tablet (800 mg total) by mouth every 8  hours as needed.  30 tablet  0   ??? [DISCONTINUED] sildenafil (VIAGRA) 100 MG tablet Take 100 mg by mouth daily as needed. 1/2 of a pill daily prn          No facility-administered encounter medications on file as of 02/20/2013.        Histories  He has a past medical history of Atrial fibrillation; Hypercholesteremia; Hypertension; Dilated cardiomyopathy; Diabetes mellitus; Thyroid disease; and Sleep apnea.    He has past surgical history that includes Septoplasty; Uvulopalatopharyngoplasty; Hernia repair (9/09); Hernia repair (2/10); and Uvulopalatopharyngoplasty.    His family history includes Coronary artery disease in his brother, sister, and sister; Diabetes in his brother, mother, and sister; Hypertension in his brother, brother, and mother.    He reports that he has never smoked. He does not have any smokeless tobacco history on file. He reports that he does not drink alcohol or use illicit drugs.        ROS:  Review of Systems   Constitutional: Negative for fever, chills, diaphoresis, fatigue and unexpected weight change.   Eyes: Negative for visual disturbance.   Respiratory: Positive for shortness of breath. Negative for cough, chest tightness and wheezing.    Cardiovascular: Negative for chest pain, palpitations and leg swelling.   Musculoskeletal: Negative for back pain.   Neurological: Negative for dizziness, light-headedness and headaches.   Psychiatric/Behavioral: Negative for confusion and decreased concentration.       Objective:   Physical Exam     Gen: AOX3, NAD  HEENT: NC/AT  Chest: mild chest wall tenderness, good inspiratory effort, clear bilaterally  Abd: soft, nontender  Ext: warm, well perfused, full range of motion.  Neuro: no focal defecits    Assessment/Plan:     1. Multiple rib fractures      Pain inadequately controlled during his paroxysms because he has stopped his narcotic due to constipation  Will add miralax for constipation in addition to senna  Will also provide script for  ultram #50 for breakthrough pain in addition to tylenol.  F/U PRN if his pain is not better controlled with this regimen.    Cruzita Lederer, MD  Trauma/Acute Care Surgery  Office: [phone]  Phone: [phone]  Pt appts: [phone]

## 2013-02-20 NOTE — Care Coordination-Inpatient (Signed)
Nurse Care Coordinator Heart Failure Assessment    Review purpose of telephone call with [name]    - To check for current signs or symptoms of worsening HF  - To answer any questions you have have about HF  - To promote early action for worsening HF  - To help you to overcome problems with self-care management    Assessment of Current Condition    Questions:  How have you felt  Since the last telephone call or visit:   unchanged  What is your weight today:   + 7 lbs  How does it compare to previous weights: higher  What is your level of Shortness of Breath:  3/10 When does SOB occur: with activity  Are you feeling more tired than usual:  No  Are there any changes in your cough: No   New cough: No   Mucous production: No  Are there any changes in you leg,ankle, or abdomen swelling:  No  Have you had any episodes of chest pain: No   With activity or rest:    Actions taken:   Have you had any episodes of dizziness or lightheadedness: No  Are all of your prescriptions currently filled: Yes   Any problems with taking medications: No   Side effects: No   Describe current level of activity and exercise: sleeping, driving, eating, watching TV and in the shower  Any other problems you can tell me about: No    Reinforce Education    -Review HF action plan  -Medication teaching   Purpose   Side effects   Scheduling doses   Missed doses and actions to take  -Dietary implications    Low sodium diet   Avoid alcohol   Others as prescribed  -Activity and Exercise   Self-monitoring for fatigue and SOB   Symptoms indication need to stop and rest  -Physician Follow-Up   No future appointments. Patient states he will call for appt    Other Interventions or Actions taken as result of  Assessment    - Patient states that he has gained the weight due to the fact he has not been able to walk  - Denies any edema, CP, cough  - States he gets SOB with activity due to " four broken ribs that I got when I fell from the ladder 2 weeks  ago"   - States he saw the " trauma Dr today and he said everything was ok"  - Advised if edema, more weight gain, cough- call immediately   - Patient agree  Informed Dr Newman Pies that patient did not want to make appt now unless he had a problem- he states he will come at the beginning of the year      Kalayla Shadden ANN Jordi Lacko, RN

## 2013-03-14 MED ORDER — ACCU-CHEK AVIVA PLUS VI STRP
Status: DC
Start: 2013-03-14 — End: 2013-11-29

## 2013-03-14 MED ORDER — ACCU-CHEK FASTCLIX LANCETS MISC
Status: DC
Start: 2013-03-14 — End: 2013-08-29

## 2013-03-14 NOTE — Telephone Encounter (Signed)
Last  Visit 09-13-2012

## 2013-03-21 MED ORDER — SITAGLIPTIN PHOSPHATE 100 MG PO TABS
100 MG | ORAL_TABLET | Freq: Every day | ORAL | Status: DC
Start: 2013-03-21 — End: 2013-05-23

## 2013-03-21 NOTE — Telephone Encounter (Signed)
Patient needs a refill for januvia 100 mg. He would like a written script to pick up and shop around for prices. He will pick up when ready  cb [phone]

## 2013-03-28 MED ORDER — GLIPIZIDE-METFORMIN HCL 2.5-500 MG PO TABS
ORAL_TABLET | Freq: Two times a day (BID) | ORAL | Status: DC
Start: 2013-03-28 — End: 2013-05-23

## 2013-03-28 NOTE — Progress Notes (Signed)
BP Readings from Last 2 Encounters:   09/13/12 124/80   03/12/12 132/80     A1c (no units)   Date Value   05/04/2010 6.8*        Hemoglobin A1C (%)   Date Value   09/13/2012 6.5         MICROALBUMIN, RANDOM URINE (mg/dL)   Date Value   16/04/958 <1.20         LDL Calculated (mg/dL)   Date Value   07/14/4096 94               Tobacco use:  Patient  reports that he has never smoked. He has never used smokeless tobacco.    If Smoker - Cessation materials given? No    Is Daily aspirin on medication list?:  Yes    Diabetic retinal exam done? Yes   If yes, document in Health Maintenance.     Monofilament placed on counter? Yes    Shoes and socks removed? Yes    BP taken with correct size cuff? Yes   Repeated if > 130/80 No     Microalbumin performed if applicable?  Yes

## 2013-03-28 NOTE — Progress Notes (Signed)
Subjective:      Patient ID: Carl Blair is a 72 y.o. male.    HPI     Weight discussed, no change over time    Wants to see Carl Blair again with wife as well    Larey Seat and broke ribs less than two months ago, right side, still recovering, better    Glucoses discussed  Sugar up last two weeks due to uri, better now, coming down        Review of Systems   Constitutional: Negative for fever, chills, appetite change and unexpected weight change.   Respiratory: Negative for cough, shortness of breath and wheezing.    Cardiovascular: Negative for chest pain, palpitations and leg swelling.   Gastrointestinal: Negative for abdominal pain, constipation and blood in stool.   Musculoskeletal: Positive for back pain and arthralgias. Negative for myalgias.   Neurological: Negative for weakness, numbness and headaches.   Hematological: Negative for adenopathy. Bruises/bleeds easily.       Objective:   Physical Exam   Constitutional: He is oriented to person, place, and time. He appears well-developed and well-nourished.   obese   Eyes: Pupils are equal, round, and reactive to light. No scleral icterus.   Cardiovascular: Normal rate, normal heart sounds and intact distal pulses.    irr irr   Pulmonary/Chest: Effort normal and breath sounds normal.   Abdominal: Soft. Bowel sounds are normal.   Musculoskeletal: He exhibits no edema.   Feet -- no sores seen  Normal pulses  Filament test abnormal bilaterally - missed 1/3 of touches     Neurological: He is alert and oriented to person, place, and time.   Psychiatric: He has a normal mood and affect. His behavior is normal. Judgment and thought content normal.       Assessment:        ICD-9-CM    1. Diabetes (HCC) 250.00 Hemoglobin A1C     Comprehensive Metabolic Panel     HM DIABETES FOOT EXAM     Microalbumin / Creatinine Urine Ratio   2. Unspecified essential hypertension 401.9 Comprehensive Metabolic Panel   3. Hyperlipidemia 272.4 CK     Comprehensive Metabolic Panel      Lipid Panel   4. Congestive heart failure, unspecified (HCC) 428.0    5. Elevated CK 790.5    6. Morbid obesity (HCC) 278.01    7. Unspecified hypothyroidism 244.9 T4, free     TSH without Reflex           Plan:      See progress note.    Zair received counseling on the following healthy behaviors: nutrition    Patient given educational materials on Diabetes    I have instructed Carl Blair to complete a self tracking handout on Blood Sugars  and instructed them to bring it with them to his next appointment.     Discussed use, benefit, and side effects of prescribed medications.  Barriers to medication compliance addressed.  All patient questions answered.  Pt voiced understanding.

## 2013-03-28 NOTE — Patient Instructions (Addendum)
Patient Self-Management Goal for Chronic Condition  Goal: I will take all medications as prescribed by my doctor, and I will call the office if I am having any medication problems.  Barriers to success: none  Plan for overcoming my barriers: N/A     Confidence: 10/10  Date goal set: 03/28/2013  Date goal attained:     Please bring both your glucose monitor and your glucose records to your next appointment.     Stop the glyburide/metformin once the glipizide/metformin comes in.

## 2013-03-29 ENCOUNTER — Encounter

## 2013-03-29 LAB — MICROALBUMIN / CREATININE URINE RATIO
Creatinine, Ur: 327.1 mg/dL — ABNORMAL HIGH (ref 39.0–259.0)
Microalbumin Creatinine Ratio: 11.3 mg/g (ref 0.0–30.0)
Microalbumin, Random Urine: 3.7 mg/dL — ABNORMAL HIGH (ref <2.0)

## 2013-03-29 LAB — T4, FREE: T4 Free: 1.4 ng/ml (ref 0.9–1.8)

## 2013-03-29 LAB — LIPID PANEL
Cholesterol, Total: 157 mg/dL (ref 0–199)
HDL: 22 mg/dL — ABNORMAL LOW (ref 40–60)
LDL Calculated: 106 mg/dL — ABNORMAL HIGH (ref <100)
Triglycerides: 143 mg/dL (ref 0–150)
VLDL Cholesterol Calculated: 29 mg/dL

## 2013-03-29 LAB — COMPREHENSIVE METABOLIC PANEL
ALT: 20 U/L (ref 10–40)
AST: 19 U/L (ref 15–37)
Albumin/Globulin Ratio: 1.9 (ref 1.1–2.2)
Albumin: 4.4 g/dL (ref 3.4–5.0)
Alkaline Phosphatase: 97 U/L (ref 40–129)
Anion Gap: 12 (ref 3–16)
BUN: 21 mg/dL — ABNORMAL HIGH (ref 7–20)
CO2: 27 mmol/L (ref 21–32)
Calcium: 8.9 mg/dL (ref 8.3–10.6)
Chloride: 98 mmol/L — ABNORMAL LOW (ref 99–110)
Creatinine: 1.1 mg/dL (ref 0.8–1.3)
GFR African American: >60 (ref >60)
GFR Non-African American: >60 (ref >60)
Globulin: 2.3 g/dL
Glucose: 191 mg/dL — ABNORMAL HIGH (ref 70–99)
Potassium: 3.9 mmol/L (ref 3.5–5.1)
Sodium: 137 mmol/L (ref 136–145)
Total Bilirubin: 0.4 mg/dL (ref 0.0–1.0)
Total Protein: 6.7 g/dL (ref 6.4–8.2)

## 2013-03-29 LAB — HEMOGLOBIN A1C
Hemoglobin A1C: 7.4 %
eAG: 165.7 mg/dL

## 2013-03-29 LAB — CK: Total CK: 282 U/L (ref 39–308)

## 2013-03-29 LAB — TSH: TSH: 1.96 u[IU]/mL (ref 0.27–4.20)

## 2013-03-29 NOTE — Telephone Encounter (Signed)
[facility] called regarding this patient, said that they received a prescription for Metaglip and is wondering if this medication is replacing another medication that the patient takes. [facility] - [phone]. Reference number [id number].

## 2013-03-29 NOTE — Care Coordination-Inpatient (Signed)
Outgoing telephone call-    D: Patient states that he is managing hi congestive heart failure well, but since he fell and broke his ribs, he has gained weight and woud like a diet that would incorporate his CHF,DM and help him lose weight.    A: Advised will consult dietician for advise. Tentative  appointment 04/15/2012 at 1515. I will call patient when time confirmed.    R: Patient verbalized understanding and agreement.

## 2013-03-29 NOTE — Telephone Encounter (Signed)
Yes,  Because of a letter than they themselves sent to the patient.

## 2013-03-29 NOTE — Telephone Encounter (Signed)
Called  The pharmacy

## 2013-04-01 NOTE — Telephone Encounter (Signed)
Referral received; confirming appt for 04/15/13 at 1515.  Thanks Harriett Sine!

## 2013-04-02 NOTE — Telephone Encounter (Signed)
Thank you! Will confirm with patient.

## 2013-04-16 NOTE — Care Coordination-Inpatient (Signed)
Registered Dietitian Initial Assessment for Care Coordination    [facility]  April 15, 2013     Initial Referral Reason: Referral received from [facility] for nutrition counseling r/t multiple nutrition prescriptions to mange CHF, T2DM, obesity.      Patient Care Team:  [name], MD as PCP - General (Internal Medicine)  Lemar Lofty, RN as Case Manager (Care Coordinator)    Patient Active Problem List   Diagnosis   ??? Contact Dermatitis and Other Eczema, due to Unspecified Cause   ??? Unspecified Chest Pain   ??? Esophageal Reflux   ??? Pain in Soft Tissues of Limb   ??? OTHER SPECIFIED DISORDERS OF LIVER   ??? Unspecified Hemorrhoids without Mention of Complication   ??? Insect Bite NEC   ??? Unspecified Hypothyroidism   ??? Diabetes (HCC)   ??? Special Screening for Malignant Neoplasm of Prostate   ??? UNSPECIFIED ESSENTIAL HYPERTENSION   ??? Hyperlipidemia   ??? Atrial fibrillation   ??? Congestive heart failure, unspecified (HCC)   ??? Dyspnea   ??? Hernia   ??? Elevated CK   ??? Otalgia of right ear   ??? Numbness and tingling in right hand   ??? Carpal tunnel syndrome   ??? Morbid obesity (HCC)   ??? Sleep apnea   ??? Right rib fracture       Labs reviewed: Yes  Medications reviewed: Yes      NUTRITION ASSESSMENT  Biochemical Data, Medical Tests and Procedures:   - 03/28/13 A1c= 7.4 (eAG= 165.7)  - 03/28/13 CMP: chloride= 98, glucose= 191, BUN= 21  - 03/28/13 microalbumin, creatinine= 3.7, 327.1  - 03/28/13 Lipid panel: HDL= 22; LDL= 106   Anthropometric Measurements:   -ht= 6'0" (182.88 cm); wt= 263.4# (119.7 kg); BMI= 35.72  -IBW= 178#; % IBW= 148%; AdBW= 199.35# (90.6 kg)       Physical Exam Findings:    Deferred Food and Nutrition History:  -lives with wife who prepares meals  -usual meal patterns: 1 large meal/day, with smaller meals/snacks throughout the day  -usual beverages: diet soda  -currently dine out 1-3x/week     Client History: Carl Blair) is a 73 y.o. male;he visits today with wife (Carl Blair) for nutrition counseling for  education regarding nutrition therapy for multiple disease states including CHF, DM, obesity.  Pt reports he is not currently making much effort to eat a healthy diet, but in the past he has tried to watch his caloric and sodium intake.  Reports hx of DM edu. Reports past wt loss success using Doylene Bode.       Pt states that his current priority is to reduce his wt as he reports a recent increase, although pt chart reflects a 3.4# wt loss from Dec 2013- Dec 2014.  Pt reports that what worked well with Doylene Bode is that the meal were pre-portioned. Discussed basic premise of energy balance and wt loss.  Pt states he knows excess portion sizes and second helpings are contributing to his wt gain.  Discussed efforts to reduce portion size and excess kcal intake.  Ideas generated include: eating off a smaller plate (9"), increasing start-to-finish time for meal consumption (eat w/ non-dominant hand, time meal consumption to last 20 mins by using music that is 20 mins long, drink water during meal).  Pt voices understanding and also offers that getting back into an exercise routine is an additional priority for him; has membership to [employer] and has treadmill at home.  Pt was walking 3-4x/week; encouraged and affirmed that this would also favorably affect energy balance for wt loss.      Basic review of CHO counting performed.  Pt recalls past education suggesting 75g of CHO/meal, but is not actively counting CHOs.  Pt surprised to learn that fruit is a source of CHOs.  Provided introductory education/review of CHO counting concepts, but pt will likely need additional education.  Offered to pt that reducing serving sizes as discussed for wt loss should positively affect CHO intake.  Provided pt with [employer] quadfold and encouraged pt to keep CHO food sources to less than size of fist serving size.  Also reviewed Create Your Plate method for meal planning.      Briefly reviewed sodium intake and  effect of fluid retention r/t CHF.  Encouraged pt to avoid salt shaker and choose products w/ < 140 mg sodium/serving.  Pt will need reinforcement of nutrition therapy for CHF and DM.    Educational handouts: [employer].         NUTRITION DIAGNOSIS    #1 Problem  Overweight/Obesity       Etiology  related to excess kcal intake and decreased energy expenditure       Signs/Symptoms  as evidenced by pt report of consumption of calorically dense foods and lack of PA, BMI= 35.72.    #2 Problem  Food and nutrition-related knowledge deficit       Etiology  related to nutrition recommendations for CHF, DM, wt loss       Signs/Symptoms  as evidenced by pt request for additional education, lack of current understanding/efforts to implement nutrition prescriptions for multiple disease states.     NUTRITION INTERVENTION  Nutrition Prescription:    Consistent CHO, heart healthy, reduced kcal diet providing 1740 kcals (MSJ- 250 kcals) to lose weight; goal for rate of weight loss: not established, recommend 0.5-2#  lbs/week.    Estimated daily CHO Needs: 196 gm (@ 45% kcals from CHO choices)  Estimated daily Protein Needs: 96 gm (based on 0.8 gm/kg)  Estimated daily Fluid Needs: 1740 mls (58 oz)  (based on RDA method of 1 ml/kcal)    Intervention #1  Nutrition Counseling: Used open-ended questions to assess pt's perceived susceptibility and/or severity of disease state- CHF, obesity, DM. Discussed potential impact of health threat on pt's lifestyle.  Used open-ended questions to assess pt's perception regarding benefits of and barriers to implementation of nutrition therapy.    Goal(s) Pt will apply constructs discussed during nutrition counseling to initiate or encourage behavior change to address nutrition issue.       Intervention #2  Nutrition Education: Clearly defined the benefits of nutrition therapy.  Summarized and affirmed positive aspects of current nutrition patterns.  Provided education  regarding negative energy balance to promote wt loss, reduced sodium to positively affect CHF status, consistent CHO to benefit BG control. Provided "how-to" education for reducing kcals, limiting CHO intake based on estimation and discussed alternatives and choices regarding step-wise implementation of multiple diet prescriptions.  Explored ideas for small, incremental goals to initiate behavior change.     Goal(s) Pt will apply education to define small goals that will help to implement appropriate nutrition therapy.       NUTRITION MONITORING AND EVALUATION  Indicator Criteria   #1  Kcal intake  #1 Will decrease via behavior modifications ideas as discussed above   #2  Wt #2 Will decrease at next assessment   #  3  PA via walking on treadmill  #3 Will increase frequency to 3-4x/week     Follow Up: Pt will call RD for follow up as necessary; contact information provided.  [facility] will also [facility] who will consult RD as needed.     Harmon Dunaitlin Elisa Kutner MS, RD, LD  Registered Dietitian, Care Coordination Team

## 2013-05-23 MED ORDER — CARVEDILOL 25 MG PO TABS
25 MG | ORAL_TABLET | Freq: Two times a day (BID) | ORAL | Status: DC
Start: 2013-05-23 — End: 2013-12-26

## 2013-05-23 MED ORDER — LEVOTHYROXINE SODIUM 100 MCG PO TABS
100 MCG | ORAL_TABLET | Freq: Every day | ORAL | Status: DC
Start: 2013-05-23 — End: 2013-12-26

## 2013-05-23 MED ORDER — SITAGLIPTIN PHOSPHATE 100 MG PO TABS
100 MG | ORAL_TABLET | Freq: Every day | ORAL | Status: DC
Start: 2013-05-23 — End: 2014-03-25

## 2013-05-23 MED ORDER — GLIPIZIDE-METFORMIN HCL 2.5-500 MG PO TABS
ORAL_TABLET | Freq: Two times a day (BID) | ORAL | Status: DC
Start: 2013-05-23 — End: 2013-12-26

## 2013-05-23 MED ORDER — OMEPRAZOLE 40 MG PO CPDR
40 MG | ORAL_CAPSULE | Freq: Every day | ORAL | Status: DC
Start: 2013-05-23 — End: 2013-12-26

## 2013-05-23 MED ORDER — ATORVASTATIN CALCIUM 10 MG PO TABS
10 MG | ORAL_TABLET | Freq: Every day | ORAL | Status: DC
Start: 2013-05-23 — End: 2013-12-26

## 2013-05-23 MED ORDER — VALSARTAN-HYDROCHLOROTHIAZIDE 320-25 MG PO TABS
320-25 MG | ORAL_TABLET | Freq: Every day | ORAL | Status: DC
Start: 2013-05-23 — End: 2013-12-26

## 2013-05-23 NOTE — Telephone Encounter (Signed)
Last  Visit  03-28-2013

## 2013-07-10 NOTE — Care Coordination-Inpatient (Signed)
Nurse Care Coordinator Heart Failure Assessment    Review purpose of telephone call with Carl Blair    Outgoing telephone call, no answer. Left message to call.  Sent list of current medications and reasons for taking/ special [email or web address]

## 2013-08-29 MED ORDER — ACCU-CHEK FASTCLIX LANCETS MISC
Status: DC
Start: 2013-08-29 — End: 2013-11-29

## 2013-09-09 NOTE — Telephone Encounter (Signed)
Last  Visit 03-28-2013

## 2013-09-09 NOTE — Telephone Encounter (Signed)
This patient is due for a followup appointment. First, please make an appointment for the patient. After the appointment has been made, please send the message back to me if a prescription or lab order is needed before the appointment.

## 2013-09-19 LAB — COMPREHENSIVE METABOLIC PANEL
ALT: 20 U/L (ref 10–40)
AST: 22 U/L (ref 15–37)
Albumin/Globulin Ratio: 2 (ref 1.1–2.2)
Albumin: 4.2 g/dL (ref 3.4–5.0)
Alkaline Phosphatase: 76 U/L (ref 40–129)
Anion Gap: 12 (ref 3–16)
BUN: 28 mg/dL — ABNORMAL HIGH (ref 7–20)
CO2: 27 mmol/L (ref 21–32)
Calcium: 9.1 mg/dL (ref 8.3–10.6)
Chloride: 101 mmol/L (ref 99–110)
Creatinine: 0.9 mg/dL (ref 0.8–1.3)
GFR African American: >60 (ref >60)
GFR Non-African American: >60 (ref >60)
Globulin: 2.1 g/dL
Glucose: 106 mg/dL — ABNORMAL HIGH (ref 70–99)
Potassium: 4.3 mmol/L (ref 3.5–5.1)
Sodium: 140 mmol/L (ref 136–145)
Total Bilirubin: 0.5 mg/dL (ref 0.0–1.0)
Total Protein: 6.3 g/dL — ABNORMAL LOW (ref 6.4–8.2)

## 2013-09-19 LAB — LIPID PANEL
Cholesterol, Total: 129 mg/dL (ref 0–199)
HDL: 29 mg/dL — ABNORMAL LOW (ref 40–60)
LDL Calculated: 81 mg/dL (ref <100)
Triglycerides: 95 mg/dL (ref 0–150)
VLDL Cholesterol Calculated: 19 mg/dL

## 2013-09-19 LAB — T4, FREE: T4 Free: 1.5 ng/ml (ref 0.9–1.8)

## 2013-09-19 LAB — CK: Total CK: 390 U/L — ABNORMAL HIGH (ref 39–308)

## 2013-09-19 LAB — TSH: TSH: 1.1 u[IU]/mL (ref 0.27–4.20)

## 2013-09-19 NOTE — Progress Notes (Signed)
Subjective:      Patient ID: Carl Blair is a 73 y.o. male.    HPI     Weight loss - great job!  Has had low glucoses, lowest 38  Discussed meds - stop the Januvia    Fasting 88-117  2 hr pp similar  This am was up, 176, uncertain why    Walking and exercising, walks 4-5 miles a day    Blood pressure discussed    Hands - carpal tunnel surgery, no help, right is getting some  Wants to wait until after motorcycle season for correction    Ears feel full again  No pain  Some decrease hearing    Review of Systems   Constitutional: Negative for fever, chills and unexpected weight change.   HENT:        See hpi   Respiratory: Negative for shortness of breath and wheezing.    Cardiovascular: Negative for chest pain and leg swelling.   Gastrointestinal: Negative for abdominal pain, diarrhea and constipation.   Endocrine:        See hpi   Musculoskeletal: Positive for arthralgias. Negative for myalgias.   Neurological: Positive for numbness (wears splints at night, sometimes during day). Negative for weakness.   Hematological: Negative for adenopathy.       Objective:   Physical Exam   Constitutional: He is oriented to person, place, and time. He appears well-developed and well-nourished.   Obesity     HENT:   Both ears wax impaction   Eyes: Pupils are equal, round, and reactive to light. No scleral icterus.   Neck: Normal range of motion. No JVD present.   Cardiovascular: Normal rate and intact distal pulses.    irr irr   Pulmonary/Chest: Effort normal and breath sounds normal.   Abdominal: Soft. Bowel sounds are normal.   Musculoskeletal: He exhibits no edema.   Neurological: He is alert and oriented to person, place, and time.   Psychiatric: He has a normal mood and affect. His behavior is normal. Judgment and thought content normal.       Assessment:        ICD-9-CM    1. Diabetes (HCC) 250.00 Hemoglobin A1C     Comprehensive Metabolic Panel   2. Screening V82.9 Hemoglobin A1c    Fall Risk Assessment Completed   3.  Morbid obesity (HCC) 278.01    4. Congestive heart failure, unspecified (HCC) 428.0 Comprehensive Metabolic Panel   5. Unspecified hypothyroidism 244.9 TSH without Reflex     T4, free   6. Special screening for malignant neoplasm of prostate V76.44    7. Hyperlipidemia 272.4 CK     Comprehensive Metabolic Panel     Lipid Panel   8. Bilateral impacted cerumen 380.4            Plan:      See progress note.

## 2013-09-19 NOTE — Assessment & Plan Note (Signed)
Verbal permission after explanation  Lavaged clear, tolerated procedure well  Instructions given verbally

## 2013-09-19 NOTE — Assessment & Plan Note (Signed)
Check labs

## 2013-09-19 NOTE — Progress Notes (Signed)
BP Readings from Last 2 Encounters:   03/28/13 124/80   09/13/12 124/80     A1c (no units)   Date Value   05/04/2010 6.8*        Hemoglobin A1C (%)   Date Value   03/28/2013 7.4         MICROALBUMIN, RANDOM URINE (mg/dL)   Date Value   16/10/960412/18/2014 3.70*        LDL Calculated (mg/dL)   Date Value   54/09/811912/18/2014 106*              Tobacco use:  Patient  reports that he has never smoked. He has never used smokeless tobacco.    If Smoker - Cessation materials given? No    Is Daily aspirin on medication list?:  Yes    Diabetic retinal exam done? Yes   If yes, document in Health Maintenance.     Monofilament placed on counter? Yes    Shoes and socks removed? Yes    BP taken with correct size cuff? Yes   Repeated if > 130/80 No     Microalbumin performed if applicable?  Yes

## 2013-09-19 NOTE — Assessment & Plan Note (Signed)
Check lab

## 2013-09-19 NOTE — Patient Instructions (Addendum)
.  Patient Self-Management Goal for Chronic Condition  Goal: I will take all medications as prescribed by my doctor, and I will call the office if I am having any medication problems.  Barriers to success: none  Plan for overcoming my barriers: N/A     Confidence: 10/10  Date goal set: 09/19/2013  Date goal attained:     Please bring both your glucose monitor and your glucose records to your next appointment.     Please call back and see if we have the Prevnar shot in stock again.

## 2013-09-19 NOTE — Assessment & Plan Note (Signed)
Good!  Cont med

## 2013-09-19 NOTE — Assessment & Plan Note (Signed)
Drop the [place], cont other meds, monitoring

## 2013-09-20 LAB — HEMOGLOBIN A1C
Hemoglobin A1C: 6.5 %
eAG: 139.9 mg/dL

## 2013-10-04 NOTE — Care Coordination-Inpatient (Signed)
Nurse Care Coordinator Heart Failure Assessment    Review purpose of telephone call with Carl Blair   Outgoing telephone call, no answer. Left message to call.      Weight loss  Vitals 03/28/2013 09/19/2013   SYSTOLIC 124 120   DIASTOLIC 80 82   Pulse 76 66   Temp     Weight 263 lb 6.4 oz 243 lb 3.2 oz   Height 6\' 0"  6\' 0"    BMI (wt*703/ht~2) 35.72 kg/m2 32.98 kg/m2         DM:    Lab Results   Component Value Date    LABA1C 6.5 09/19/2013    LABA1C 7.4 03/28/2013    LABA1C 6.5 09/13/2012     Lab Results   Component Value Date    EAG 139.9 09/19/2013    EAG 165.7 03/28/2013    EAG 139.9 09/13/2012

## 2013-11-29 MED ORDER — ACCU-CHEK FASTCLIX LANCETS MISC
Status: DC
Start: 2013-11-29 — End: 2020-05-13

## 2013-11-29 MED ORDER — ACCU-CHEK AVIVA PLUS VI STRP
ORAL_STRIP | Status: DC
Start: 2013-11-29 — End: 2014-05-15

## 2013-11-29 NOTE — Telephone Encounter (Signed)
[facility] [address] called and said that they need a prescription for Lancets - they needs the same exact prescription, but add diagnosis code so they can bill the insurance. [facility]: [phone].

## 2013-12-03 NOTE — Care Coordination-Inpatient (Signed)
Nurse Care Coordinator Heart Failure Assessment  Cardiology visit 08/23/2013=  ht 72 in/ wt 252 lbs/ BMI 34.30    Patient was out mowing the grass, but wife states that he has lost another 22 lbs and he is now down to 230. Advised wife that I will call in tomorrow to congratulate the patient and assess his CHF.    Rodena Medin RN,BSN, [facility]  Nurse Care Coordinator  [phone]  [facility]

## 2013-12-26 MED ORDER — LEVOTHYROXINE SODIUM 100 MCG PO TABS
100 MCG | ORAL_TABLET | Freq: Every day | ORAL | Status: DC
Start: 2013-12-26 — End: 2014-06-18

## 2013-12-26 MED ORDER — OMEPRAZOLE 40 MG PO CPDR
40 MG | ORAL_CAPSULE | Freq: Every day | ORAL | Status: DC
Start: 2013-12-26 — End: 2014-06-18

## 2013-12-26 MED ORDER — GLIPIZIDE-METFORMIN HCL 2.5-500 MG PO TABS
ORAL_TABLET | Freq: Two times a day (BID) | ORAL | Status: DC
Start: 2013-12-26 — End: 2014-04-14

## 2013-12-26 MED ORDER — VALSARTAN-HYDROCHLOROTHIAZIDE 320-25 MG PO TABS
320-25 MG | ORAL_TABLET | Freq: Every day | ORAL | Status: DC
Start: 2013-12-26 — End: 2014-03-24

## 2013-12-26 MED ORDER — CARVEDILOL 25 MG PO TABS
25 MG | ORAL_TABLET | Freq: Two times a day (BID) | ORAL | Status: DC
Start: 2013-12-26 — End: 2014-06-18

## 2013-12-26 MED ORDER — ATORVASTATIN CALCIUM 10 MG PO TABS
10 MG | ORAL_TABLET | Freq: Every day | ORAL | Status: DC
Start: 2013-12-26 — End: 2014-06-18

## 2013-12-26 NOTE — Telephone Encounter (Signed)
Patient called and said that he needs a refill on medication valsartan, omeprezole, levothyroxine, carvedilol, and atorvastatin. [facility]. Patient call back number: [phone].

## 2013-12-26 NOTE — Telephone Encounter (Signed)
Last  Visit 09-23-2012

## 2014-01-17 NOTE — Care Coordination-Inpatient (Signed)
Interventions complete.  Engaged with teleassurance  [facility]

## 2014-01-28 ENCOUNTER — Inpatient Hospital Stay: Admit: 2014-01-28 | Payer: MEDICARE

## 2014-01-28 DIAGNOSIS — G5602 Carpal tunnel syndrome, left upper limb: Secondary | ICD-10-CM

## 2014-02-12 ENCOUNTER — Ambulatory Visit: Admit: 2014-02-12 | Discharge: 2014-02-12 | Payer: MEDICARE | Attending: Family

## 2014-02-12 DIAGNOSIS — G4733 Obstructive sleep apnea (adult) (pediatric): Secondary | ICD-10-CM

## 2014-02-12 NOTE — Unmapped (Addendum)
Sleep Apnea   Sleep apnea is a sleep disorder characterized by abnormal pauses in breathing while you sleep. When your breathing pauses, the level of oxygen in your blood decreases. This causes you to move out of deep sleep and into light sleep. As a result, your quality of sleep is poor, and the system that carries your blood throughout your body (cardiovascular system) experiences stress. If sleep apnea remains untreated, the following conditions can develop:  ?? High blood pressure (hypertension).  ?? Coronary artery disease.  ?? Inability to achieve or maintain an erection (impotence).  ?? Impairment of your thought process (cognitive dysfunction).  There are three types of sleep apnea:  1. Obstructive sleep apnea Pauses in breathing during sleep because of a blocked airway.  2. Central sleep apnea Pauses in breathing during sleep because the area of the brain that controls your breathing does not send the correct signals to the muscles that control breathing.  3. Mixed sleep apnea A combination of both obstructive and central sleep apnea.  RISK FACTORS  The following risk factors can increase your risk of developing sleep apnea:  ?? Being overweight.  ?? Smoking.  ?? Having narrow passages in your nose and throat.  ?? Being of older age.  ?? Being male.  ?? Alcohol use.  ?? Sedative and tranquilizer use.  ?? Ethnicity. Among individuals younger than 35 years, African Americans are at increased risk of sleep apnea.  SYMPTOMS   ?? Difficulty staying asleep.  ?? Daytime sleepiness and fatigue.  ?? Loss of energy.  ?? Irritability.  ?? Loud, heavy snoring.  ?? Morning headaches.  ?? Trouble concentrating.  ?? Forgetfulness.  ?? Decreased interest in sex.  DIAGNOSIS   In order to diagnose sleep apnea, your caregiver will perform a physical examination. Your caregiver may suggest that you take a home sleep test. Your caregiver may also recommend that you spend the night in a sleep lab. In the sleep lab, several monitors record  information about your heart, lungs, and brain while you sleep. Your leg and arm movements and blood oxygen level are also recorded.  TREATMENT  The following actions may help to resolve mild sleep apnea:  ?? Sleeping on your side. ??  ?? Using a decongestant if you have nasal congestion. ??  ?? Avoiding the use of depressants, including alcohol, sedatives, and narcotics. ??  ?? Losing weight and modifying your diet if you are overweight.  There also are devices and treatments to help open your airway:  ?? Oral appliances. These are custom-made mouthpieces that shift your lower jaw forward and slightly open your bite. This opens your airway.  ?? Devices that create positive airway pressure. This positive pressure splints your airway open to help you breathe better during sleep. The following devices create positive airway pressure:  ?? Continuous positive airway pressure (CPAP) device. The CPAP device creates a continuous level of air pressure with an air pump. The air is delivered to your airway through a mask while you sleep. This continuous pressure keeps your airway open.  ?? Nasal expiratory positive airway pressure (EPAP) device. The EPAP device creates positive air pressure as you exhale. The device consists of single-use valves, which are inserted into each nostril and held in place by adhesive. The valves create very little resistance when you inhale but create much more resistance when you exhale. That increased resistance creates the positive airway pressure. This positive pressure while you exhale keeps your airway open, making it easier   to breath when you inhale again.  ?? Bilevel positive airway pressure (BPAP) device. The BPAP device is used mainly in patients with central sleep apnea. This device is similar to the CPAP device because it also uses an air pump to deliver continuous air pressure through a mask. However, with the BPAP machine, the pressure is set at two different levels. The pressure when you  exhale is lower than the pressure when you inhale.  ?? Surgery. Typically, surgery is only done if you cannot comply with less invasive treatments or if the less invasive treatments do not improve your condition. Surgery involves removing excess tissue in your airway to create a wider passage way.  Document Released: 03/18/2002 Document Revised: 09/27/2011 Document Reviewed: 08/04/2011  ExitCare?? Patient Information ??2014 [employer]      PREPARING TO USE POSITIVE AIRWAY PRESSURE DEVICE AT HOME    Setting up your equipment (To be used at all times when sleeping, including naps)  ?? Place your machine on a hard, level surface close to where you sleep. Plug your machine into a grounded outlet. If you need to use an extension cord, we suggest you use a surge protector.   ?? Connect your hose to the airflow outlet and connect the other end of the hose to your mask.   ?? Using distilled water only, fill the water chamber. Do NOT fill above the maximum line.   ?? Replace the distilled water daily, as bacteria may start to grow.   ?? If you have nasal dryness or a dry mouth, increase the humidity level one increment at a time.  ?? If your sleeping environment is cool, you can place the PAP hose under your covers to prevent condensation in the tubing.   ?? INSTRUCTIONS ON HOW TO PUT ON YOUR MASK: Place your mask on your face loosely. Most patients make the mistake of putting their masks on too tight.    o Turn on your machine. Lay back on your pillow to adjust your mask, as it will probably fit differently when you sit up vs. lying down. Remember, start very loose, and tighten as needed. A loose mask is much better than a tight mask. You will feel some air from the exhalation port on the mask. This is normal.   ?? Please note that if you are given a mask during your overnight sleep study or by your DME (medical supply company) you have 30 days to make any changes or exchanges. After the 30-day period, you may not be able to  return the mask or make changes for up to 3 months or longer depending on your insurance. If you have any questions please contact your DME (medical supply company).      We hope you do well adjusting to using your PAP device. PAP therapy is the gold standard treatment for Obstructive Sleep Apnea.     Few features we would like to highlight on your PAP device.       The first thing we want to point out is the RAMP feature. If you need temporary relief from the full therapy pressure of your machine, you can press the ramp button.  Respironics device: Press the elongated triangle button once.   F&P device: Press and hold the large dial for 3 seconds.    ResMed device: Ramp feature will automatically activate when you place the mask on your face (this is a built in feature). To reactivate it just pull the mask away from   you face and reposition it. Ramp feature will reactivate.     The Ramp feature temporarily allows you to reduce the pressure on your machine. Your machine can be set to ramp up within 5-45 minutes to your full therapy pressure. If you are not asleep by the time your machine reaches full pressure, and you need relief, you can re-start the ramping process by pressing the button again. You can use the ramp feature as many times and as often as you like. This is a useful feature if you use the restroom in the middle of the night.     The next thing we would like to point out is the HUMIDIFICATION feature on your PAP device. Most machines are pre-set for a humidity setting of 3 or 4. You can manually change the humidifier settings on your machine when you turn the machine on. Increase the humidity level one increment at a time.  Respironics device: Turn the on/off button to the left for less humidification or the right for more humidification. Humidification ranges from 1-5. If you have a heated hose talk to your provider or DME who can teach you how to make adjustments.   F&P device: Turn the large dial  to the right. Humidification ranges from 1-7.   ResMed device: Attach the humidifier component to the main unit, turn   the dial and choose the water droplet icon; press the button once. Then turn the dial to adjust your humidity setting. Press button a final time to save the setting. Humidification ranges from 1-8 (depending on device model). The newer device has an Auto humidification feature, if you have a heated hose talk to your provider or DME to further assistance.     For example, if you are waking up with a dry mouth, you should turn your humidifier up one number at a time. If you are getting excess condensation in your mask or tubing, you should turn the humidifier down one number at a time.      A prescription will be sent to a DME (medical supply company) covered by your insurance. The DME Company should contact you within 7-10 business days once they receive the prescription. If you have not received a call from the DME Company regarding your device, PLEASE CALL US so we may investigate further.     Once set up with your PAP equipment, please call our office and set up an appointment with your sleep provider. At that visit, we will obtain a download from your machine, address any issues with the mask or device, review cleaning instructions, replacement supply schedule and answer any questions you may have.     After receiving your PAP device please remember you need to bring all your equipment to EVERY office visit including your machine, tubing, mask and power cord. Make sure you do not travel with water in the humidifier (always empty it every morning). A thorough check of all your equipment will take place at each appointment.          CLEANING INSTRUCTIONS FOR PAP EQUIPMENT     Keeping your equipment and supplies clean is very important.     REMINDER: Only use DISTILLED WATER in your humidifier, Empty water daily.    DAILY  Mask and tubing:   ?? Wash your face before applying mask  ?? Wash mask and  tubing with baby shampoo and warm water.     Humidifier:   ?? Empty water in reservoir  ??   Clean with baby shampoo and warm water  ?? Rinse, then air dry    WEEKLY  Mask and tubing:   ?? Soak your mask and tubing in 1 part vinegar and 3 parts water for 30 minutes. Rinse, and allow to air dry.     Humidifier:   ?? Wash with warm water and baby shampoo  ?? Soak in 1 part vinegar and 3 parts water for 30 minutes  ?? Rinse with warm water and allow to air dry    Machine Exterior:   ?? Wipe with a clean damp cloth    MONTHLY AND/OR AS NEEDED  ?? Reusable foam filters (black filter)- wash in warm water with baby shampoo. Rinse well and dry with paper towel  ?? Disposable felt filter (white filter)- Replace filter every two weeks to once a month    NOTE: If you are having repeated sinus and /or respiratory infections, dirty equipment may be the cause. It may help to clean and disinfect your equipment more frequently    TRAVELING  Always make sure the humidifier is empty when traveling. (including doctor appointments, air travel or long distance driving).  When flying, always carry your PAP device with you as a carry on item, NEVER check it in as baggage. We can provide you with a letter stating it is a medical device, talk to your provider.    Always make sure you have your mask and tubing with you. You will need appropriate plug adapters when traveling outside the [place].    If travelling by air and you are unable to carry distilled water with youuse bottled water (no more than 2 weeks). DO NOT USE TAP WATER.     Equipment Replacement Schedule    To get the most benefit from your PAP therapy, your equipment should be replaced when necessary based on wear and tear. For example, your mask may need to be replaced if you notice it is cracked or the seal is leaking. If your tubing is torn, it needs to be replaced.   If you equipment is showing signs of wear, you may be entitled to replace it. The replacement schedule for Medicare  patients is shown below. If you are NOT a Medicare patient, please check with your DME (Durable Medical Supply) provider for your individual insurance policy???s replacement schedule.     Supplies   Medicare Medicaid My Insurance Plan  Mask    1 per 3 m 1 per year _______________  Nasal cushion  2 per m 1 per 6 m _______________  Pillows cushion  2 per m 1per 6 m _______________  Full-face cushion  1 per m 1per 6 m _______________  Headgear   1 per 6 m 1 per year _______________  Water Chamber  1 per 6 m N/A  _______________  Chinstrap   1 per 6 m 1 per 6 m       _______________  Tubing/Hose   1 per 3 m 1 per year _______________  Heater wire tubing  1 per 3 m N/A  _______________  Filter, disposable (white) 2 per m 1 per m _______________  Filter, particle foam (black) 1 per 6 m 4 per year _______________  Therapy device  1 per 5 yrs  ?  _______________    Follow up and compliance goals for Medicare and Medicaid patients   (most private insurances are now following similar rules)  Medicare and Medicaid require patients to follow-up with their provider after they are   set up with their PAP equipment. The follow up appointment should be within 31-90 days after receiving their equipment. If patient does not follow up as directed there might be issues with the cost of the device being covered by the insurance. Therefore please ensure you make a follow up appointment with your sleep provider.      Insurance also requires that the patient meet some compliance goals such as the download should indicate patient has been wearing the device for at least 4 hours per night AND they are using the device for 30 nights in a row.     Key points to remember  ?? You should wear the device every time you sleep, no matter when you sleep or where you sleep. When you nap or travel or go on vacation you need to use the machine when asleep.   ?? If you sleep without the device your Obstructive Sleep Apnea (OSA) is untreated and your risk of  heart attacks and strokes are higher.   ?? Clean the device on a regularly basis to avoid sinus infections and pneumonias   ?? Never travel with water in the reservoir   ?? Replace supplies on a regularly basis (see schedule below). You may need to contact your Durable Medical Equipment company (DME) and request supplies (just like you would medications from a pharmacy).  ?? Alcohol will worsen OSA, Alcohol can decrease your drive to breathe, slowing your breathing and making your breaths shallow. In addition, it may relax the muscles of your throat, which may make it more likely for your upper airway to collapse. REMEMBER TO ALWAYS WEAR YOUR PAP DEVICE WHEN SLEEPING.   ?? Certain medications can also worsen OSA such as some pain medications, anti-anxiety medications, muscle relaxants, testosterone supplementation. Please inform your sleep provider which medications you take on a regular basis.   ?? All patients are advised to make follow up appointments with the provider after they are set up with their PAP device. Most insurance plans now require the patient set up a follow up appointment with the provider 31-90 days after they have been set up with their device and at least once a year thereafter. Discuss these issues with your provider for more details.   ?? Please note when you are provided with a mask you have a manufacturer 30 day guarantee that comes with it. If you do not like that mask for any reason contact your DME and request an exchange. After 30 days you may not be eligible for a new mask for another 3-6 months depending on insurance. Talk to your DME or sleep provider for more details.   ?? If you are struggling with nasal congestion try to use a nasal sinus rinse available over the counter such as Netti Pot 1 hour before bedtime (you may have to use it daily for a 7-10 days then as needed). You can also try over the counter Flonase or Nasacort 1-2 sprays in each nostril at bedtime.   ?? If you ever have  problems with the device or the plan discussed with your sleep provider has not come to fruition please contact your provider by phone or via MyChart. Thank you      Sleep Hygiene     Sleep disruption is common, especially during times when you may feel emotionally overwhelmed. Anxieties, relentless replay of the day???s events and heightened emotions may significantly interfere with your sleep. Lack of sleep robs you of needed rest, making management   of your illness more difficult.   Bring sleep patterns under control and working at a consistent, stable pattern is very important to illness management. You need your rest. The most common cause of insomnia is a change in your daily routine. For example traveling, change in work hours, disruption of other behaviors (eating, exercise, leisure, etc.), relationship conflicts may cause sleep problems. Paying attention to good sleep hygiene is the most important thing you can do to maintain good sleep.     Do:   1. Go to bed at the same time each day.   2. Get up from bed at the same time each day.   3. Get regular exercise each day, preferably in the morning. There is good evidence that regular exercise improves restful sleep. This includes stretching and aerobic exercise.   4. Get regular exposure to outdoor or bright lights, especially in the late afternoon.   5. Keep the temperature in your bedroom comfortable.   6. Keep the bedroom quiet when sleeping   7. Keep the bedroom dark enough to facilitate sleep.   8. Use your bed only for sleep and sex.   9. Take medications as directed. It???s often helpful to take prescribed sleeping pills one hour before bedtime, so they are causing drowsiness when you lie down, or 10 hours before getting up to avoid daytime drowsiness.   10. Use a relaxation exercise just before going to sleep, (muscle relaxation, imagery, massage, breathing exercises, warm baths).   11. Keep your feet and hands warm. Wear warm socks and/or mittens or gloves  to bed.     Don???t:   1. Exercise just before going to bed.   2. Engage in stimulating activity just before bed, such as playing a competitive game, watching an exciting program on TV or movie, or having an important discussion with a loved one.   3. Have caffeine in the evening (coffee, many teas, chocolate, sodas, etc). No caffeine after noon.    4. Read or watch TV in bed.   5. Use alcohol to help you sleep.   6. Go to bed too hungry or too full.   7. Take another person???s sleeping pills.   8. Take over-the-counter sleeping pills without your doctor???s knowledge. Tolerance can develop rapidly with these medications. Diphenhydramine (an ingredient commonly found in over-the-counter sleep medications) can have serious side effects for elderly patients.   9. Take daytime naps.   10. Command yourself to go to sleep. This only makes your mind and body more alert.   11. Do not look at the time when you wake up in the middle of the night. Remove the clocks from the bedroom or set your alarm for the morning and have it face the wall.   If you lie in bed awake for more than 20-30 minutes, get up, go to a different room (or different part of the bedroom), and participate in a quiet activity (non-excitable reading or listen to soothing music) then return to bed when you feel sleepy. Do this as many times during the night as needed.    Excessive daytime sleepiness/drowsiness    Excessive sleepiness is a situation where your brain is suffering from a lack of sleep or from a medication with sedative side effects. When you are sleepy you are at risk if you are operating a motor vehicle, other equipment or during critical decision-making processes.    Sleepiness is natural when you are at the end of the day near   the time of your usual bedtime.  Sleepiness can occur at other times as well.  Inappropriate sleepiness can be caused by sleep disorders that degrade the quality of your sleep, by bad habits where you purposely or  inadvertently short yourself with regard to adequate amounts of sleep, bad habits such as varying the time you go to bed and get up or, through no-fault of your own, when you are in a stressful situation and you have difficulty initiating and maintaining sleep at your usual time.    Many medications can have sleepiness or sedation as a side effect.  Common such medications include narcotic pain relievers (such as codeine, Percocet, Vicodin, morphine etc.), muscle relaxants (such as Flexeril or Soma), tranquilizers taken for anxiety (Ativan, Xanax, Valium etc.),  other tranquilizers ( such as Geodon, Risperdal, Haldol etc.) amongst many other medications.  It should be fairly obvious that these medicines could cause you to be excessively sleepy.  There are other medications that can produce sedation which are less obvious such as prescription and over-the-counter antihistamines, Chantix, Requip, Mirapex, Sinemet, older antidepressants (e.g. Elavil (amitriptyline), medications that are used to treat seizures and many others.    The nature of sleepiness is that the brain does not react as quickly as it would otherwise do when we are more rested.  Your reaction time will be slower.  You may find it harder to do a simple task and complex task might even allude you.  You can be irritable and have trouble interacting with other people.  In addition to the inattentiveness, memory issues and irritability you are at risk of inadvertently falling asleep.    If you follow asleep when you???re operating a motor vehicle the potential for a disaster is obvious.  If you are lucky, the rumble strip will wake you up.  Otherwise it may be the bridge abutment or the oncoming traffic.  When you fall asleep while driving you cannot will yourself back from sleep.  Your brain will wake up when it is refreshed (which will not happen if you???re behind the wheel of a car or operating other equipment) or when you???re awakened by whatever you hit.   When you get the warning signs of sleepiness such as head bobbing, eyelids drooping, relaxing back into the car seat you need to recognize these warning signs and stop driving or figure out a way to activate your brain to allow continued driving to be safe.  If you don???t get warning signals that you???re sleepy and you???ve had problems properly controlling your vehicle, have had near misses or accidents due to sleepiness then you should stop driving and not resume until the problem is adequately addressed.  It is you???re responsibility to curtail or eliminate you???re driving when you cannot stay awake.    If you???re an equipment operator, sleepiness can put you at risk for on-the-job injuries or death.  If you are too sleepy to operate the equipment it is your responsibility and duty to stop.    In addition to risk of injury or death, sleepiness in the workplace can lead to reduced productivity, problems with interpersonal relations due to irritability, and errors.  Depending on your job impairment due to sleepiness could have varying degrees of impact both to you, to the people around you, and perhaps to others.     If you???re under treatment for a qualitative problem with your sleep and you are still sleepy to the point you cannot safely drive, operate equipment   necessary for your job or if you???re sleepiness is impairing your ability to make appropriate decisions then you need to make an appointment with your sleep provider to address this problem as soon as possible.  In the meantime, you need to undertake whatever measures are necessary to ensure your safety and the safety of others around you.        DROWSY DRIVING TIPS    These suggestions will help prevent you from the risk of drowsy driving.     1. If you feel tired or drowsy don't drive. Sleepiness is a major cause of motor vehicle accidents and accounts for 40% of all fatal crashes reported on the [place] thru[place]. No matter how much you think you can control  sleepiness, you can't.     2. Ensure you follow your doctor's advice about the treatment for your sleep disorder. For example, if you have sleep apnea and use CPAP, ensure you use it fully the night before your trip.     3. Get a good night's sleep before driving. Do not cut yourself short of sleep if you plan a long drive the next day. Get to bed early and do not stay up late packing.     4.Avoid alcohol both the night before your trip and during your trip. Alcohol will disrupt sleep and make you more tired the next day. Sleepiness and alcohol are additive in increasing impairment of your driving ability.     5. Avoid any sedative medications, including sedative antihistamines that are often contained in cold or allergy medications, the night before you drive as they may have long lasting effects the next day.     6. Travel during non-sleeping hours. Accidents due to sleepiness are more common during the nighttime hours.     7. If sleepy, stop and rest. Drink coffee and walk around. Take a brief nap, lock your car doors and take a nap in your car if you are sleepy. Have a 10-15 minute break after every 2 hours of driving.     8. Drive with a companion. Share the driving. Relax in the back seat until it is your time to share the driving again.     These are only guidelines; please discuss your diagnosis and treatment with your sleep medicine provider. Drive safely.

## 2014-02-12 NOTE — Unmapped (Signed)
~~~~~~~~~~~~~~~~~~~~~~~~~~~~~~~~~~~~~~~~~~~~~~~~~~~  Referring Provider: Dr Newman Pies    Chief Complaint: Follow up OSA on CPAP yearly follow up    Date of NPSG: 05/27/2003   SE %: 63.5   AHI: 52   Lowest p ox %: 73   NPSG performed at: [facility]     Date of CPAP: 12/06/2008   SE %: 76.5   AHI: 1.1   Lowest p ox %: 90.0   CPAP performed at: [facility] @ time of study: 265 lbs  DME Company: [employer] type: full face  cmH2O: 13       HPI: Richard Waddington Johnsonis a 73 y.o. male following up on OSAHS currently well managed on CPAP and is compliant. Unknown airway information his device is not AHI capable last studied 2010. Due for new device, which he is requesting and needs supplies. He call [employer] for supplies and takes some time to get them. He does not know where they are even located.     Previous UPPP, oral venting and likes his FF mask    He is now walking 5 miles per day and has lost a significant amount of weight     HTN, DM, atrial fib - corrected with mini-maze procedure) GERD, dylipidemia      DEVICE/ MASK/ DME INFORMATION  DME: [employer]  Mask type: Airfit F10 large FF mask    Compliance Data Download:01/13/2014-02/11/2014   Device Model: respironics  PAP Pressure: 13 cm H2O  Percent Days with Device Usage: 100 %  Percent of Days with usage >= 4 hours: 100 %  Average Usage (number of hours used for all days): 6 hours 50 minutes   AHI: not capable    Epworth Sleepiness Scale score is 10 (at or above 10 is abnormal and 24 is the maximum score),     Sleep Schedule  Bedtime is 12a on weekdays and same on weekends. Falls asleep within 10 minutes, with 2 nighttime awakenings for bathroom. Falls back asleep in 10 minutes. Sleeps on own bed. Awakens at 7 during the week and 7 on the weekends.   Patient thinks they get approximately 5 hours of sleep a night.    Patient's ideal sleep schedule would be to fall asleep at 11 and wake up at 7    Patient does takes naps 3 per week for 1 hr    Past Medical Hx:   Past Medical History    Diagnosis Date   ??? Atrial fibrillation    ??? Hypercholesteremia    ??? Hypertension    ??? Dilated cardiomyopathy    ??? Diabetes mellitus      type 2   ??? Thyroid disease      hypothyroidism   ??? Sleep apnea    ??? Cataract      left eye   ??? Hearing loss      left ear     SurgHx:   Past Surgical History   Procedure Laterality Date   ??? Septoplasty     ??? Uvulopalatopharyngoplasty     ??? Hernia repair  9/09     right inguinal, mini maze   ??? Hernia repair  2/10     herniated lungs bilateral (repaired with mesh)   ??? Uvulopalatopharyngoplasty       has had esophageal dilations in the past (Dr.Decter)   ??? Wisdom tooth extraction     ??? Colonoscopy w/ polypectomy       benign  has at 3 plus procedures  Problem List:  Patient Active Problem List   Diagnosis   ??? Insect Bite NEC   ??? Hemangioma of Intra-Abdominal Structures   ??? Other Specified Disorders of Liver   ??? Malignant Neoplasm of Head of Pancreas   ??? Disorder of esophagus   ??? Chest pain   ??? Contact dermatitis and other eczema, due to unspecified cause   ??? Morbid obesity   ??? Obstructive sleep apnea   ??? Hypothyroidism   ??? DM w/o Complication Type II   ??? Other and Unspecified Hyperlipidemia   ??? Essential hypertension   ??? Atrial Fibrillation   ??? Congestive heart failure   ??? Hemorrhoids   ??? Esophageal reflux   ??? Pain in Soft Tissues of Limb   ??? Special Screening for Malignant Neoplasm of Prostate   ??? Multiple rib fractures     CURRENT MEDICATIONS:   Current Outpatient Prescriptions   Medication Sig   ??? acetaminophen Take 2 tablets (1,000 mg total) by mouth 3 times a day.   ??? aspirin Take 81 mg by mouth daily.   ??? atorvastatin Take 10 mg by mouth at bedtime.    ??? carvedilol Take 25 mg by mouth 2 (two) times daily.     ??? GLUC/CHON-MSM#1/C/MANG/BOS/BOR (OSTEO BI-FLEX ORAL) Take by mouth. OSTEO BI-FLEX JOINT SHIELD TABS    ??? glyBURIDE-metformin Take 2 tablets by mouth 2 times a day.    ??? levothyroxine Take 100 mcg by mouth every morning.    ??? omeprazole Take 40 mg by mouth every  morning.    ??? SAW PALMETTO XTR/ZINC PICOLIN (SAW PALMETTO EXTRACT ORAL) Take 2 tablets by mouth 2 times a day.    ??? valsartan-hydrochlorothiazide Take 1 tablet by mouth every morning.      No current facility-administered medications for this visit.      Allergies  Allergies as of 02/12/2014   ??? (No Known Allergies)     Family Hx:   Family History   Problem Relation Age of Onset   ??? Diabetes Mother      type 2   ??? Hypertension Mother    ??? Diabetes Sister      type 2   ??? Coronary artery disease Sister    ??? Coronary artery disease Brother    ??? Hypertension Brother    ??? Diabetes Brother      type 2   ??? Coronary artery disease Sister    ??? Hypertension Brother      Social Hx:   Patient  reports that he has never smoked. He does not have any smokeless tobacco history on file. He reports that he does not drink alcohol or use illicit drugs.  Please see attached form for further details.     Review of Systems  General ROS: all negative  Ophthalmic ROS: negative  Ears, nose, mouth, throat, and face ROS: positive for dry mouth  Respiratory ROS: all negative  Cardiovascular ROS: negative  Gastrointestinal ROS: negative  Genitourinary UJW:JXBJYNWG for nocturia  Integument/breast ROS: negative  Hematologic/lymphatic ROS: negative  Musculoskeletal NFA:OZHYQMVH  Neurological ROS: positive for numbness and tingling in extremities  Psychological ROS: all negative  Endocrine ROS: all  negative  Allergy and Immunology ROS: positive for - postnasal drip    Denies: suicidal ideas, irritability, depression, racing mind, changes in appetite, fainting spells, alcoholism, tremors, using illegal drugs, worrying about inability to sleep, insomnia, chest pain and shortness of breath,  lower extremity edema,  I have reviewed the review of system  and advised patient to contact primary care physician or specialist for non sleep related issues.    Previous Report(s) Reviewed: ER records, historical medical records, lab reports, office notes and  radiology reports     The following portions of the patient's history were reviewed and updated as appropriate: allergies, current medications, past family history, past medical history, past social history, past surgical history and problem list.    OBJECTIVE :   BP 128/84   Pulse 67   Ht 6' (1.829 m)   Wt 230 lb (104.327 kg)   BMI 31.19 kg/m2   SpO2 98%   Wt Readings from Last 3 Encounters:   02/12/14 230 lb (104.327 kg)   02/20/13 272 lb (123.378 kg)   02/07/13 266 lb (120.657 kg)       Physical Exam   Constitutional: alert and in no apparent distress. Well-developed and well-nourished.   Head: normocephalic and atraumatic.   Ears: appear normal externally bilaterally  Eyes: Conjunctivae are normal. Pupils are equal, round, and reactive to light.no scleral icterus, drainage or redness.   Nose: Columella normal. Positive for nasal congestion. No deviated septum.   Mouth : Mallampati 4. Crowded airway. Oropharynx is clear, moist and pink. UPPP,  Macroglossia w/ ridging. Dentition poor missing teeth.  Neck: Supple, grossly normal range of motion. No thyromegaly. No adenopathy. No tenderness. Trachea midline.    Cardiovascular: normal rate and regular rhythm. Neg murmur. No peripheral edema   Pulmonary/Chest: effort normal and breath sounds normal. No stridor. No respiratory distress, no wheezes, no rales.   Abdominal: deferred   Musculoskeletal: Grossly normal range of motion and strength. Normal gait. No edema and no tenderness.   Neurological:  alert. Cranial nerves II-XII grossly intact. Strength 5/5 in upper and lower extremities bilaterally. Normal gait.  No tremor.   Skin: skin is warm and dry. No rash noted.   Psychiatric: normal speech, normal mood and affect, behavior is normal. Judgment and thought content normal.     Labs Reviewed:   No results found for this basename: IRON, TIBC, FERRITIN     Lab Results   Component Value Date    WBC 8.7 02/07/2013    HGB 13.0* 02/07/2013    HCT 38.7 02/07/2013    MCV  84.7 02/07/2013    PLT 219 02/07/2013     Lab Results   Component Value Date    GLUCOSE 224* 02/07/2013    BUN 26* 02/07/2013    CO2 27 02/07/2013    CREATININE 1.08 02/07/2013    K 4.5 02/07/2013    NA 137 02/07/2013    CL 101 02/07/2013    CALCIUM 8.7 02/07/2013     Lab Results   Component Value Date    HGBA1C 7.2* 04/15/2008     Lab Results   Component Value Date    TSH 1.50 04/15/2008    FREET4 1.6 04/15/2008       ASSESSMENT  Richard Anastasi Johnsonis a 73 y.o. male following up on OSAHS currently well managed on  CPAP he is compliant. Unknown airway information his device is not AHI capable. He would like a new device and would really perfer not to go back in the lab for a calibration study if possible.       HTN, DM, atrial fib - corrected with mini-maze procedure) GERD, dylipidemia  UPPP- oral venting and likes his FF mask    PLAN:   1. OSA (obstructive sleep apnea)  ordered new device that is  auto and AHI capable, and will switch DME companies    Reviewed DME, CPAP, mask policy, humidification, RAMP, heated tubing, cleaning instructions, and when he can get supplies. Talked about we have 90 days to meet compliance required by the insurance companies. They require you using the device more than 4 hours 70 %. Within the first 90 days or the DME will take away the device per some insurance company rules.  Some insurance companies also require a office visit between day 31 and 90 day. Handouts given. Instructed pt to review before going to the DME, so you know what questions to ask, and make sure the respiratory therapist shows you how to use the features of the device.     Reviewed importance of using PAP to control OSA at all times when sleeping such as when travelling and taking naps.     Patient was educated regarding OSAHS pathophysiology, relationship with cardiovascular, cerebrovascular and metabolic morbidity and mortality, issues related to PAP adherence, its use, cleaning and maintenance.  if any issues were to  arise before their next office visit they may follow up sooner or contact their DME company for assistance.  Patient has been advised that anything preventing them from using the device on a daily basis is a concern that should be relayed to [place] their sleep provider. Patient verbalized understanding and agrees to comply.     The patient was educated on the dangers of drowsy driving; advised to avoid long distance or night driving. Instructed to take a short nap on the side of the road if sleepy when driving. Tips to avoid drowsy driving were provided to patient.     Sleep hygiene techniques were provided to patient.     Return to clinic in 2 months    Medical Decision Making:   The following items were considered in medical decision making:   Review / order other diagnostic tests/interventions   Risks & Benefits:   Risks, benefits and treatment options discussed with patient.   Time Spent With Patient:   Time spent with patient: 30 minutes   Time spent discussing diagnosis, management, and treatment plan: > 25 minutes

## 2014-02-13 NOTE — Unmapped (Signed)
F[facility]ed [facility] orders, ov[facility] to [facility]

## 2014-02-18 ENCOUNTER — Ambulatory Visit: Admit: 2014-02-18 | Payer: MEDICARE

## 2014-02-18 DIAGNOSIS — I0981 Rheumatic heart failure: Secondary | ICD-10-CM

## 2014-02-18 LAB — BASIC METABOLIC PANEL
Anion Gap: 6 mmol/L (ref 3–16)
BUN: 28 mg/dL — ABNORMAL HIGH (ref 7–25)
CO2: 30 mmol/L (ref 21–33)
Calcium: 9.2 mg/dL (ref 8.6–10.3)
Chloride: 101 mmol/L (ref 98–110)
Creatinine: 0.93 mg/dL (ref 0.60–1.30)
GFR MDRD Af Amer: 96 See note.
GFR MDRD Non Af Amer: 80 See note.
Glucose: 134 mg/dL — ABNORMAL HIGH (ref 70–100)
Osmolality, Calculated: 291 mosm/kg (ref 278–305)
Potassium: 4 mmol/L (ref 3.5–5.3)
Sodium: 137 mmol/L (ref 133–146)

## 2014-03-12 NOTE — Unmapped (Signed)
Per Chris at [facility] the orders were received and they have everything they need from us.

## 2014-03-24 ENCOUNTER — Ambulatory Visit: Admit: 2014-03-24 | Discharge: 2014-03-24 | Payer: MEDICARE | Attending: Internal Medicine | Primary: Internal Medicine

## 2014-03-24 DIAGNOSIS — E119 Type 2 diabetes mellitus without complications: Secondary | ICD-10-CM

## 2014-03-24 NOTE — Patient Instructions (Signed)
Please bring both your glucose monitor and your glucose records to your next appointment.     The goal in treating blood pressure is under 140 for the top number and under 90 for the bottom number.

## 2014-03-24 NOTE — Progress Notes (Signed)
Subjective:      Patient ID: Carl Blair is a 73 y.o. male.    HPI     Lost almost 50 pounds!    Fasting for blood work    Didn't bring glucose records, "great"  106 typically fasting  occ 80s  This am 123  Usually checks 2 hr pp  Walking a lot, 5 miles most days    No acute problems    Review of Systems   Constitutional: Negative for fever, chills, appetite change and unexpected weight change.   Respiratory: Negative for cough, shortness of breath and wheezing.    Cardiovascular: Negative for chest pain, palpitations and leg swelling.   Gastrointestinal: Negative for abdominal pain, diarrhea and constipation.   Endocrine: Negative for cold intolerance and heat intolerance.   Musculoskeletal: Positive for arthralgias (good overall, using osteobiflex, no pain). Negative for myalgias and back pain.   Skin: Negative for rash and wound.   Neurological: Negative for light-headedness and headaches.   Hematological: Negative for adenopathy. Does not bruise/bleed easily.   Psychiatric/Behavioral: Negative for dysphoric mood. The patient is not nervous/anxious.        Objective:   Physical Exam   Constitutional: He is oriented to person, place, and time. He appears well-developed and well-nourished.   Eyes: Pupils are equal, round, and reactive to light. No scleral icterus.   Neck: Normal range of motion. No JVD present.   Cardiovascular: Normal rate, regular rhythm, normal heart sounds and intact distal pulses.    Pulmonary/Chest: Effort normal and breath sounds normal.   Abdominal: Soft. Bowel sounds are normal.   Musculoskeletal: He exhibits no edema.   Feet -- no sores seen  Normal pulses  Filament test normal bilaterally     Neurological: He is alert and oriented to person, place, and time.   Psychiatric: He has a normal mood and affect. His behavior is normal. Judgment and thought content normal.       Assessment:        ICD-10-CM ICD-9-CM    1. Type 2 diabetes mellitus without complication (HCC) E11.9 250.00  Ambulatory referral to Ophthalmology      HM DIABETES FOOT EXAM      Microalbumin / Creatinine Urine Ratio      Hemoglobin A1C   2. Screening Z13.9 V82.9 HM FALL RISK    Fall Risk Assessment Completed   3. Essential hypertension I10 401.9 Comprehensive Metabolic Panel   4. Pure hypercholesterolemia E78.0 272.0 CK      Comprehensive Metabolic Panel      Lipid Panel   5. Morbid obesity due to excess calories (HCC) E66.01 278.01    6. Acquired hypothyroidism E03.9 244.9 TSH without Reflex      T4, free   7. Prostate cancer screening Z12.5 V76.44 Psa screening           Plan:      See progress note.

## 2014-03-25 LAB — MICROALBUMIN / CREATININE URINE RATIO
Creatinine, Ur: 183.6 mg/dL (ref 39.0–259.0)
Microalbumin Creatinine Ratio: 9.3 mg/g (ref 0.0–30.0)
Microalbumin, Random Urine: 1.7 mg/dL (ref <2.0)

## 2014-03-25 LAB — COMPREHENSIVE METABOLIC PANEL
ALT: 21 U/L (ref 10–40)
AST: 20 U/L (ref 15–37)
Albumin/Globulin Ratio: 2 (ref 1.1–2.2)
Albumin: 4.5 g/dL (ref 3.4–5.0)
Alkaline Phosphatase: 87 U/L (ref 40–129)
Anion Gap: 16 (ref 3–16)
BUN: 31 mg/dL — ABNORMAL HIGH (ref 7–20)
CO2: 26 mmol/L (ref 21–32)
Calcium: 9.2 mg/dL (ref 8.3–10.6)
Chloride: 101 mmol/L (ref 99–110)
Creatinine: 1.1 mg/dL (ref 0.8–1.3)
GFR African American: >60 (ref >60)
GFR Non-African American: >60 (ref >60)
Globulin: 2.2 g/dL
Glucose: 117 mg/dL — ABNORMAL HIGH (ref 70–99)
Potassium: 4.7 mmol/L (ref 3.5–5.1)
Sodium: 143 mmol/L (ref 136–145)
Total Bilirubin: 0.5 mg/dL (ref 0.0–1.0)
Total Protein: 6.7 g/dL (ref 6.4–8.2)

## 2014-03-25 LAB — LIPID PANEL
Cholesterol, Total: 126 mg/dL (ref 0–199)
HDL: 34 mg/dL — ABNORMAL LOW (ref 40–60)
LDL Calculated: 74 mg/dL (ref <100)
Triglycerides: 89 mg/dL (ref 0–150)
VLDL Cholesterol Calculated: 18 mg/dL

## 2014-03-25 LAB — PSA SCREENING: PSA: 0.7 ng/mL (ref 0.00–4.00)

## 2014-03-25 LAB — HEMOGLOBIN A1C
Hemoglobin A1C: 5.9 %
eAG: 122.6 mg/dL

## 2014-03-25 LAB — CK: Total CK: 204 U/L (ref 39–308)

## 2014-03-25 LAB — TSH: TSH: 1.69 u[IU]/mL (ref 0.27–4.20)

## 2014-03-25 LAB — T4, FREE: T4 Free: 1.4 ng/dL (ref 0.9–1.8)

## 2014-04-14 ENCOUNTER — Ambulatory Visit: Admit: 2014-04-14 | Discharge: 2014-04-14 | Payer: MEDICARE | Attending: Family

## 2014-04-14 DIAGNOSIS — G4733 Obstructive sleep apnea (adult) (pediatric): Secondary | ICD-10-CM

## 2014-04-14 MED ORDER — METFORMIN HCL 500 MG PO TABS
500 MG | ORAL_TABLET | Freq: Two times a day (BID) | ORAL | Status: DC
Start: 2014-04-14 — End: 2014-04-14

## 2014-04-14 MED ORDER — METFORMIN HCL 500 MG PO TABS
500 MG | ORAL_TABLET | Freq: Two times a day (BID) | ORAL | Status: DC
Start: 2014-04-14 — End: 2014-06-19

## 2014-04-14 NOTE — Unmapped (Signed)
Key points to remember  ?? You should wear the device every time you sleep, no matter when you sleep or where you sleep. When you nap or travel or go on vacation you need to use the machine when asleep.   ?? Bring your device, mask, hose, power cord etc to every office visit.   ?? If you sleep without the device your Obstructive Sleep Apnea (OSA) is untreated and your risk of heart attacks and strokes are higher.   ?? Clean the device on a regularly basis to avoid sinus infections and pneumonias   ?? Never travel with water in the reservoir   ?? Replace supplies on a regularly basis (see schedule below). You may need to contact your [employer] Medical [employer] company (DME) and request supplies (just like you would medications from a pharmacy).  ?? Alcohol will worsen OSA, Alcohol can decrease your drive to breathe, slowing your breathing and making your breaths shallow. In addition, it may relax the muscles of your throat, which may make it more likely for your upper airway to collapse. REMEMBER TO ALWAYS WEAR YOUR PAP DEVICE WHEN SLEEPING.   ?? Certain medications can also worsen OSA such as some pain medications, anti-anxiety medications, muscle relaxants, testosterone supplementation. Please inform your sleep provider which medications you take on a regular basis.   ?? All patients are advised to make follow up appointments with the provider after they are set up with their PAP device. Most insurance plans now require the patient set up a follow up appointment with the provider 31-90 days after they have been set up with their device and at least once a year thereafter. Discuss these issues with your provider for more details.   ?? Please note when you are provided with a mask you have a manufacturer 30 day guarantee that comes with it. If you do not like that mask for any reason contact your DME and request an exchange. After 30 days you may not be eligible for a new mask for another 3-6 months depending on insurance. Talk  to your DME or sleep provider for more details.   ?? If you are struggling with nasal congestion try to use a nasal sinus rinse available over the counter such as Netti Pot 1 hour before bedtime (you may have to use it daily for a 7-10 days then as needed). You can also try over the counter Flonase or Nasacort 1-2 sprays in each nostril at bedtime.   ?? If you ever have problems with the device or the plan discussed with your sleep provider please contact your provider by phone or using MyChart. Thank you      CLEANING INSTRUCTIONS FOR PAP EQUIPMENT     Keeping your equipment and supplies clean is very important.     REMINDER: Only use DISTILLED WATER in your humidifier, Empty water daily.    DAILY  Mask and tubing:   ?? Wash your face before applying mask  ?? Wash mask and tubing with baby shampoo and warm water.     Humidifier:   ?? Empty water in reservoir  ?? Clean with baby shampoo and warm water  ?? Rinse, then air dry    WEEKLY  Mask and tubing:   ?? Soak your mask and tubing in 1 part vinegar and 3 parts water for 30 minutes. Rinse, and allow to air dry.     Humidifier:   ?? Wash with warm water and baby shampoo  ?? Soak in   1 part vinegar and 3 parts water for 30 minutes  ?? Rinse with warm water and allow to air dry    Machine Exterior:   ?? Wipe with a clean damp cloth    MONTHLY AND/OR AS NEEDED  ?? Reusable foam filters (black filter)- wash in warm water with baby shampoo. Rinse well and dry with paper towel  ?? Disposable felt filter (white filter)- Replace filter every two weeks to once a month    NOTE: If you are having repeated sinus and /or respiratory infections, dirty equipment may be the cause. It may help to clean and disinfect your equipment more frequently    TRAVELING  Always make sure the humidifier is empty when traveling. (including doctor appointments, air travel or long distance driving).  When flying, always carry your PAP device with you as a carry on item, NEVER check it in as baggage. We can  provide you with a letter stating it is a medical device, talk to your provider.    Always make sure you have your mask and tubing with you. You will need appropriate plug adapters when traveling outside the [place].    If travelling by air and you are unable to carry distilled water with you use bottled water (no more than 2 weeks). DO NOT USE TAP WATER.       Equipment Replacement Schedule    To get the most benefit from your PAP therapy, your equipment should be replaced when necessary based on wear and tear. For example, your mask may need to be replaced if you notice it is cracked or the seal is leaking. If your tubing is torn, it needs to be replaced.   If your equipment is showing signs of wear, you may be entitled to replace it. The replacement schedule for Medicare patients is shown below. If you are NOT a Medicare patient, please check with your DME ([employer]) provider for your individual insurance policy???s replacement schedule.     Supplies   Medicare Medicaid My Insurance Plan  Mask    1 per 3 m 1 per year _______________  Nasal cushion  2 per m 1 per 6 m _______________  Pillows cushion  2 per m 1per 6 m _______________  Full-face cushion  1 per m 1per 6 m _______________  Headgear   1 per 6 m 1 per year _______________  Water Chamber  1 per 6 m N/A  _______________  Chinstrap   1 per 6 m 1 per 6 m       _______________  Tubing/Hose   1 per 3 m 1 per year _______________  Heater wire tubing  1 per 3 m N/A  _______________  Filter, disposable (white) 2 per m 1 per m _______________  Filter, particle foam (black) 1 per 6 m 4 per year _______________  Therapy device  1 per 5 yrs  ?  _______________    Follow up and compliance goals for Medicare and Medicaid patients   (most private insurances are now following similar rules)  Medicare and Medicaid require patients to follow-up with their provider after they are set up with their PAP equipment. The follow up appointment should be within  31-90 days after receiving their equipment. If patient does not follow up as directed there might be issues with the cost of the device being covered by the insurance. Therefore please ensure you make a follow up appointment with your sleep provider.      Insurance   also requires that the patient meet some compliance goals such as the download should indicate patient has been wearing the device for at least 4 hours per night AND they are using the device for 30 nights in a row.        Excessive daytime sleepiness/drowsiness    Excessive sleepiness is a situation where your brain is suffering from a lack of sleep or from a medication with sedative side effects. When you are sleepy you are at risk if you are operating a motor vehicle, other equipment or during critical decision-making processes.    Sleepiness is natural when you are at the end of the day near the time of your usual bedtime.  Sleepiness can occur at other times as well.  Inappropriate sleepiness can be caused by sleep disorders that degrade the quality of your sleep, by bad habits where you purposely or inadvertently short yourself with regard to adequate amounts of sleep, bad habits such as varying the time you go to bed and get up or, through no-fault of your own, when you are in a stressful situation and you have difficulty initiating and maintaining sleep at your usual time.    Many medications can have sleepiness or sedation as a side effect.  Common such medications include narcotic pain relievers (such as codeine, Percocet, Vicodin, morphine etc.), muscle relaxants (such as Flexeril or Soma), tranquilizers taken for anxiety (Ativan, Xanax, Valium etc.),  other tranquilizers ( such as Geodon, Risperdal, Haldol etc.) amongst many other medications.  There are other medications that can produce sedation which are less obvious such as prescription and over-the-counter antihistamines, Chantix, Requip, Mirapex, Sinemet, older antidepressants (e.g.  Elavil (amitriptyline), medications that are used to treat seizures and many others.    The nature of sleepiness is that the brain does not react as quickly as it would otherwise do when we are more rested.  Your reaction time will be slower.  You may find it harder to do a simple task and complex task might even allude you.  You can be irritable and have trouble interacting with other people.  In addition to the inattentiveness, memory issues and irritability you are at risk of inadvertently falling asleep.    If you follow asleep when you???re operating a motor vehicle the potential for a disaster is obvious.  If you are lucky, the rumble strip will wake you up.  Otherwise it may be the bridge abutment or the oncoming traffic.  When you fall asleep while driving you cannot will yourself back from sleep.  Your brain will wake up when it is refreshed (which will not happen if you???re behind the wheel of a car or operating other equipment) or when you???re awakened by whatever you hit.  When you get the warning signs of sleepiness such as head bobbing, eyelids drooping, relaxing back into the car seat you need to recognize these warning signs and stop driving or figure out a way to activate your brain to allow continued driving to be safe.  If you don???t get warning signals that you???re sleepy and you???ve had problems properly controlling your vehicle, have had near misses or accidents due to sleepiness then you should stop driving and not resume until the problem is adequately addressed.  It is you???re responsibility to curtail or eliminate you???re driving when you cannot stay awake.    If you???re an equipment operator, sleepiness can put you at risk for on-the-job injuries or death.  If you are too sleepy   to operate the equipment it is your responsibility and duty to stop.    In addition to risk of injury or death, sleepiness in the workplace can lead to reduced productivity, problems with interpersonal relations due to  irritability, and errors.  Depending on your job impairment due to sleepiness could have varying degrees of impact both to you, to the people around you, and perhaps to others.     If you???re under treatment for a qualitative problem with your sleep and you are still sleepy to the point you cannot safely drive, operate equipment necessary for your job or if you???re sleepiness is impairing your ability to make appropriate decisions then you need to make an appointment with your sleep provider to address this problem as soon as possible.  In the meantime, you need to undertake whatever measures are necessary to ensure your safety and the safety of others around you.        DROWSY DRIVING TIPS    These suggestions will help prevent you from the risk of drowsy driving.     1. If you feel tired or drowsy don't drive. Sleepiness is a major cause of motor vehicle accidents and accounts for 40% of all fatal crashes reported on the [place] thru[place]. No matter how much you think you can control sleepiness, you can't.     2. Ensure you follow your doctor's advice about the treatment for your sleep disorder. For example, if you have sleep apnea and use CPAP, ensure you use it fully the night before your trip.     3. Get a good night's sleep before driving. Do not cut yourself short of sleep if you plan a long drive the next day. Get to bed early and do not stay up late packing.     4. Avoid alcohol both the night before your trip and during your trip. Alcohol will disrupt sleep and make you more tired the next day. Sleepiness and alcohol are additive in increasing impairment of your driving ability.     5. Avoid any sedative medications, including sedative antihistamines that are often contained in cold or allergy medications, the night before you drive as they may have long lasting effects the next day.     6. Travel during non-sleeping hours. Accidents due to sleepiness are more common during the nighttime hours.     7. If sleepy,  stop and rest. Drink coffee and walk around. Take a brief nap, lock your car doors and take a nap in your car if you are sleepy. Have a 10-15 minute break after every 2 hours of driving.     8. Drive with a companion. Share the driving. Relax in the back seat until it is your time to share the driving again.     These are only guidelines; please discuss your diagnosis and treatment with your sleep medicine provider. Drive safely.       Sleep Hygiene     Sleep disruption is common, especially during times when you may feel emotionally overwhelmed. Anxieties, relentless replay of the day???s events and heightened emotions may significantly interfere with your sleep. Lack of sleep robs you of needed rest, making management of your illness more difficult.   Bring sleep patterns under control and working at a consistent, stable pattern is very important to illness management. You need your rest. The most common cause of insomnia is a change in your daily routine. For example traveling, change in work hours, disruption of other behaviors (  eating, exercise, leisure, etc.), relationship conflicts may cause sleep problems. Paying attention to good sleep hygiene is the most important thing you can do to maintain good sleep.     Do:   1. Go to bed at the same time each day.   2. Get up from bed at the same time each day.   3. Get regular exercise each day, preferably in the morning. There is good evidence that regular exercise improves restful sleep. This includes stretching and aerobic exercise.   4. Get regular exposure to outdoor or bright lights, especially in the late afternoon.   5. Keep the temperature in your bedroom comfortable.   6. Keep the bedroom quiet when sleeping   7. Keep the bedroom dark enough to facilitate sleep.   8. Use your bed only for sleep and sex.   9. Take medications as directed. It???s often helpful to take prescribed sleeping pills one hour before bedtime, so they are causing drowsiness when you lie  down, or 10 hours before getting up to avoid daytime drowsiness.   10. Use a relaxation exercise just before going to sleep, (muscle relaxation, imagery, massage, breathing exercises, warm baths).   11. Keep your feet and hands warm. Wear warm socks and/or mittens or gloves to bed.     Don???t:   1. Exercise just before going to bed.   2. Engage in stimulating activity just before bed, such as playing a competitive game, watching an exciting program on TV or movie, or having an important discussion with a loved one.   3. Have caffeine in the evening (coffee, many teas, chocolate, sodas, etc). No caffeine after noon.    4. Read or watch TV in bed.   5. Use alcohol to help you sleep.   6. Go to bed too hungry or too full.   7. Take another person???s sleeping pills.   8. Take over-the-counter sleeping pills without your doctor???s knowledge. Tolerance can develop rapidly with these medications. Diphenhydramine (an ingredient commonly found in over-the-counter sleep medications) can have serious side effects for elderly patients.   9. Take daytime naps.   10. Command yourself to go to sleep. This only makes your mind and body more alert.   11. Do not look at the time when you wake up in the middle of the night. Remove the clocks from the bedroom or set your alarm for the morning and have it face the wall.   If you lie in bed awake for more than 20-30 minutes, get up, go to a different room (or different part of the bedroom), and participate in a quiet activity (non-excitable reading or listen to soothing music) then return to bed when you feel sleepy. Do this as many times during the night as needed.

## 2014-04-14 NOTE — Unmapped (Signed)
~~~~~~~~~~~~~~~~~~~~~~~~~~~~~~~~~~~~~~~~~~~~~~~~~~~  Referring Provider: Dr Newman Pies    Chief Complaint: Follow up OSA on new CPAP set up  Date of NPSG: 05/27/2003   SE %: 63.5   AHI: 52   Lowest p ox %: 73   NPSG performed at: [facility]     Date of CPAP: 12/06/2008   SE %: 76.5   AHI: 1.1   Lowest p ox %: 90.0   CPAP performed at: [facility] @ time of study: 265 lbs  DME Company: [employer] type: full face  cmH2O: 13       HPI: Richard Olesen Johnsonis a 74 y.o. male following up on OSAHS currently well managed on CPAP and is compliant. Likes his new device.  Previous UPPP, oral venting and likes his FF mask    He is now walking 5 miles per day and has lost a significant amount of weight     HTN, DM, atrial fib - corrected with mini-maze procedure) GERD, dylipidemia      DEVICE/ MASK/ DME INFORMATION  DME: medical services  Mask type: Airfit F10 large FF mask    Compliance Data Download:03/05/2014-04/13/2013 40/40 days  Device Model: resmed  PAP Pressure: min 5 max 20cm H2O  Percent Days with Device Usage: 100 %  Average ressure 11.7 cmH2O min 7.4 max 13.4  Percent of Days with usage >= 4 hours: 100 %  Average Usage (number of hours used for all days): 7 hours 10 minutes   AHI: 8.6  Leak 51.8 max 76.7 L/min    Epworth Sleepiness Scale score is 9 (at or above 10 is abnormal and 24 is the maximum score),     Sleep Schedule  Bedtime is 12a on weekdays and same on weekends. Falls asleep within 10 minutes, with 2 nighttime awakenings for bathroom. Falls back asleep in 10 minutes. Sleeps on own bed. Awakens at 7 during the week and 7 on the weekends.   Patient thinks they get approximately 5 hours of sleep a night.    Patient's ideal sleep schedule would be to fall asleep at 11 and wake up at 7    Patient does takes naps 3 per week for 1 hr    Past Medical Hx:   Past Medical History   Diagnosis Date   ??? Atrial fibrillation      corrected with Mini-maze procedure   ??? Hypercholesteremia    ??? Hypertension    ??? Dilated cardiomyopathy     ??? Diabetes mellitus      type 2   ??? Thyroid disease      hypothyroidism   ??? Sleep apnea    ??? Cataract      left eye   ??? Hearing loss      left ear     SurgHx:   Past Surgical History   Procedure Laterality Date   ??? Septoplasty     ??? Uvulopalatopharyngoplasty     ??? Hernia repair  9/09     right inguinal, mini maze   ??? Hernia repair  2/10     herniated lungs bilateral (repaired with mesh)   ??? Uvulopalatopharyngoplasty       has had esophageal dilations in the past (Dr.Decter)   ??? Wisdom tooth extraction     ??? Colonoscopy w/ polypectomy       benign  has at 3 plus procedures   ??? Mini-maze       corrected his A-fib     Problem List:  Patient Active Problem List   Diagnosis   ??? Insect Bite NEC   ??? Hemangioma of Intra-Abdominal Structures   ??? Other Specified Disorders of Liver   ??? Malignant Neoplasm of Head of Pancreas   ??? Disorder of esophagus   ??? Chest pain   ??? Contact dermatitis and other eczema, due to unspecified cause   ??? Morbid obesity   ??? Obstructive sleep apnea   ??? Hypothyroidism   ??? DM w/o Complication Type II   ??? Other and Unspecified Hyperlipidemia   ??? Essential hypertension   ??? Atrial Fibrillation   ??? Congestive heart failure   ??? Hemorrhoids   ??? Esophageal reflux   ??? Pain in Soft Tissues of Limb   ??? Special Screening for Malignant Neoplasm of Prostate   ??? Multiple rib fractures   ??? OSA (obstructive sleep apnea)     CURRENT MEDICATIONS:   Current Outpatient Prescriptions   Medication Sig   ??? acetaminophen Take 2 tablets (1,000 mg total) by mouth 3 times a day.   ??? aspirin Take 81 mg by mouth daily.   ??? atorvastatin Take 10 mg by mouth at bedtime.    ??? carvedilol Take 25 mg by mouth 2 (two) times daily.     ??? GLUC/CHON-MSM#1/C/MANG/BOS/BOR (OSTEO BI-FLEX ORAL) Take by mouth. OSTEO BI-FLEX JOINT SHIELD TABS    ??? glyBURIDE-metformin Take 2 tablets by mouth 2 times a day.    ??? levothyroxine Take 100 mcg by mouth every morning.    ??? omeprazole Take 40 mg by mouth every morning.    ??? SAW PALMETTO XTR/ZINC PICOLIN  (SAW PALMETTO EXTRACT ORAL) Take 2 tablets by mouth 2 times a day.    ??? valsartan-hydrochlorothiazide Take 1 tablet by mouth every morning.      No current facility-administered medications for this visit.      Allergies  Allergies as of 04/14/2014   ??? (No Known Allergies)     Family Hx:   Family History   Problem Relation Age of Onset   ??? Diabetes Mother      type 2   ??? Hypertension Mother    ??? Diabetes Sister      type 2   ??? Coronary artery disease Sister    ??? Coronary artery disease Brother    ??? Hypertension Brother    ??? Diabetes Brother      type 2   ??? Coronary artery disease Sister    ??? Hypertension Brother      Social Hx:   Patient  reports that he has never smoked. He does not have any smokeless tobacco history on file. He reports that he does not drink alcohol or use illicit drugs.  Please see attached form for further details.     Review of Systems  General ROS: all negative  Ophthalmic ROS: negative  Ears, nose, mouth, throat, and face ROS: positive for dry mouth  Respiratory ROS: all negative  Cardiovascular ROS: negative  Gastrointestinal ROS: negative  Genitourinary BJY:NWGNFAOZ for nocturia  Integument/breast ROS: negative  Hematologic/lymphatic ROS: negative  Musculoskeletal HYQ:MVHQIONG  Neurological ROS: positive for numbness and tingling in extremities  Psychological ROS: all negative  Endocrine ROS: all  negative  Allergy and Immunology ROS: positive for - postnasal drip    Denies: suicidal ideas, irritability, depression, racing mind, changes in appetite, fainting spells, alcoholism, tremors, using illegal drugs, worrying about inability to sleep, insomnia, chest pain and shortness of breath,  lower extremity edema,  I have reviewed  the review of system and advised patient to contact primary care physician or specialist for non sleep related issues.    Previous Report(s) Reviewed: ER records, historical medical records, lab reports, office notes and radiology reports     The following portions of  the patient's history were reviewed and updated as appropriate: allergies, current medications, past family history, past medical history, past social history, past surgical history and problem list.    OBJECTIVE :   BP 140/80 mmHg   Pulse 66   Ht 6' (1.829 m)   Wt 234 lb (106.142 kg)   BMI 31.73 kg/m2   SpO2 96%   Wt Readings from Last 3 Encounters:   04/14/14 234 lb (106.142 kg)   02/12/14 230 lb (104.327 kg)   02/20/13 272 lb (123.378 kg)       Physical Exam   Constitutional: alert and in no apparent distress. Well-developed and well-nourished.   Head: normocephalic and atraumatic.   Ears: appear normal externally bilaterally  Eyes: Conjunctivae are normal. Pupils are equal, round, and reactive to light.no scleral icterus, drainage or redness.   Nose: Columella normal. Positive for nasal congestion. No deviated septum.   Mouth : Mallampati 4. Crowded airway. Oropharynx is clear, moist and pink. UPPP,  Macroglossia w/ ridging. Dentition poor missing teeth.  Neck: Supple, grossly normal range of motion. No thyromegaly. No adenopathy. No tenderness. Trachea midline.    Cardiovascular: normal rate and regular rhythm. Neg murmur. No peripheral edema   Pulmonary/Chest: effort normal and breath sounds normal. No stridor. No respiratory distress, no wheezes, no rales.   Abdominal: deferred   Musculoskeletal: Grossly normal range of motion and strength. Normal gait. No edema and no tenderness.   Neurological:  alert. Cranial nerves II-XII grossly intact. Strength 5/5 in upper and lower extremities bilaterally. Normal gait.  No tremor.   Skin: skin is warm and dry. No rash noted.   Psychiatric: normal speech, normal mood and affect, behavior is normal. Judgment and thought content normal.     Labs Reviewed:   No results found for: IRON  Lab Results   Component Value Date    WBC 8.7 02/07/2013    HGB 13.0* 02/07/2013    HCT 38.7 02/07/2013    MCV 84.7 02/07/2013    PLT 219 02/07/2013     Lab Results   Component Value Date     GLUCOSE 134* 02/18/2014    BUN 28* 02/18/2014    CO2 30 02/18/2014    CREATININE 0.93 02/18/2014    K 4.0 02/18/2014    NA 137 02/18/2014    CL 101 02/18/2014    CALCIUM 9.2 02/18/2014     Lab Results   Component Value Date    HGBA1C 7.2* 04/15/2008     Lab Results   Component Value Date    TSH 1.50 04/15/2008    FREET4 1.6 04/15/2008       ASSESSMENT  Richard Mckinnon Johnsonis a 74 y.o. male following up on OSAHS currently well managed on  CPAP he is compliant. FF Mask is leaking a lot.     HTN, DM, atrial fib - corrected with mini-maze procedure) GERD, dylipidemia  UPPP- oral venting and likes his FF mask    PLAN:   1. OSA (obstructive sleep apnea)  He will call the DME and see if they can help him with fitting his mask.   Changed Pressure settings min 7 max 14 cmH2O    Reviewed DME, CPAP, mask policy, humidification,  RAMP, heated tubing, cleaning instructions, and when he can get supplies. Talked about we have 90 days to meet compliance required by the insurance companies. They require you using the device more than 4 hours 70 %. Within the first 90 days or the DME will take away the device per some insurance company rules.  Some insurance companies also require a office visit between day 31 and 90 day. Handouts given. Instructed pt to review before going to the DME, so you know what questions to ask, and make sure the respiratory therapist shows you how to use the features of the device.     Reviewed importance of using PAP to control OSA at all times when sleeping such as when travelling and taking naps.     Patient was educated regarding OSAHS pathophysiology, relationship with cardiovascular, cerebrovascular and metabolic morbidity and mortality, issues related to PAP adherence, its use, cleaning and maintenance.  if any issues were to arise before their next office visit they may follow up sooner or contact their DME company for assistance.  Patient has been advised that anything preventing them from using the  device on a daily basis is a concern that should be relayed to [place] their sleep provider. Patient verbalized understanding and agrees to comply.     The patient was educated on the dangers of drowsy driving; advised to avoid long distance or night driving. Instructed to take a short nap on the side of the road if sleepy when driving. Tips to avoid drowsy driving were provided to patient.     Sleep hygiene techniques were provided to patient.     Return to clinic in 2 months    Medical Decision Making:   The following items were considered in medical decision making:   Review / order other diagnostic tests/interventions   Risks & Benefits:   Risks, benefits and treatment options discussed with patient.   Time Spent With Patient:   Time spent with patient: 30 minutes   Time spent discussing diagnosis, management, and treatment plan: > 25 minutes

## 2014-04-14 NOTE — Telephone Encounter (Signed)
Last OV 03/24/14

## 2014-04-14 NOTE — Telephone Encounter (Signed)
Does he want the glipizide metformin combo or just the metformin?  (See December letter to him.)

## 2014-04-14 NOTE — Telephone Encounter (Signed)
He would like to change as you suggested to the metformin

## 2014-04-14 NOTE — Telephone Encounter (Signed)
From: Carl Blair  To: Hughie ClossLawrence Neack, MD  Sent: 04/11/2014 5:52 AM EST  Subject: Medication Renewal Request    Original authorizing provider: Hughie ClossLawrence Neack, MD    Carl Blair would like a refill of the following medications:  glipiZIDE-metformin (METAGLIP) 2.5-500 MG per tablet [name][Lawrence Neack, MD]    [facility] [place] - [id number] - P [phone] F [phone]    Comment:  I would like a new subscription for METFORM sent to [facility]. This is a change from glipizide-metformin per Dr request.

## 2014-05-08 LAB — HM DIABETES EYE EXAM

## 2014-05-15 ENCOUNTER — Ambulatory Visit: Admit: 2014-05-15 | Discharge: 2014-05-15 | Payer: MEDICARE | Attending: Internal Medicine | Primary: Internal Medicine

## 2014-05-15 ENCOUNTER — Encounter

## 2014-05-15 DIAGNOSIS — E119 Type 2 diabetes mellitus without complications: Secondary | ICD-10-CM

## 2014-05-15 MED ORDER — GLUCOSE BLOOD VI STRP
ORAL_STRIP | Status: DC
Start: 2014-05-15 — End: 2015-11-17

## 2014-05-15 NOTE — Assessment & Plan Note (Signed)
Check lab for surgery  Doing well

## 2014-05-15 NOTE — Progress Notes (Signed)
Subjective:      Patient ID: Carl Blair is a 74 y.o. male.    HPI     Planned left eye cataract removal and iol placement    Not ill lately    Kept weight off  Glucoses discussed, good    ekg - last one done by cardiologist before hand surgery 02-2014    Current Outpatient Prescriptions   Medication Sig Dispense Refill   ??? glucose blood VI test strips (ACCU-CHEK AVIVA PLUS) strip TEST BLOOD SUGAR ONCE DAILY dx E11.9 100 strip 1   ??? metFORMIN (GLUCOPHAGE) 500 MG tablet Take 1 tablet by mouth 2 times daily (with meals) THIS REPLACES HIS GLIPIZIDE/METFORMIN COMBINATION. 180 tablet 1   ??? valsartan-hydrochlorothiazide (DIOVAN HCT) 320-25 MG per tablet Take 0.5 tablets by mouth daily 90 tablet 1   ??? omeprazole (PRILOSEC) 40 MG capsule Take 1 capsule by mouth daily 90 capsule 1   ??? levothyroxine (SYNTHROID) 100 MCG tablet Take 1 tablet by mouth daily 90 tablet 1   ??? carvedilol (COREG) 25 MG tablet Take 1 tablet by mouth 2 times daily 180 tablet 1   ??? atorvastatin (LIPITOR) 10 MG tablet Take 1 tablet by mouth daily 90 tablet 1   ??? ACCU-CHEK FASTCLIX LANCETS MISC Test once daily and prn dx 250.00 102 each 1   ??? Blood Glucose Monitoring Suppl DEVI by Does not apply route. Diagnosis   250.00 1 Device 0   ??? Cholecalciferol (VITAMIN D) 2000 UNITS CAPS capsule Take 1 capsule by mouth daily.       ??? SAW PALMETTO Take  by mouth daily. Indications: XRT     ??? aspirin 81 MG EC tablet      ??? fluorouracil (EFUDEX) 5 % cream   0   ??? Glucosamine Sulfate POWD        No current facility-administered medications for this visit.      Allergies:  No Known Allergies  Prior Medical History:       Diagnosis Date   ??? Atrial fibrillation (HCC)    ??? Hyperlipidemia    ??? Hypertension    ??? Type II or unspecified type diabetes mellitus without mention of complication, not stated as uncontrolled (HCC)    ??? Hypothyroidism    ??? Unspecified sleep apnea    ??? Cardiomyopathy      Social History  History   Substance Use Topics   ??? Smoking status: Never  Smoker    ??? Smokeless tobacco: Never Used   ??? Alcohol Use: Yes      Comment: rare     Prior Surgical History:      Procedure Laterality Date   ??? Hernia repair       Right   ??? Uvulopalatopharygoplasty     ??? Septoplasty     ??? Cardiac surgery  2009     mini maze     Family History:       Problem Relation Age of Onset   ??? Diabetes Mother    ??? Hypertension Mother    ??? Diabetes Sister    ??? Coronary Art Dis Sister    ??? Diabetes Brother    ??? Coronary Art Dis Brother    ??? Hypertension Brother    ??? Asthma Sister            Review of Systems   Constitutional: Negative for fever, chills, appetite change and unexpected weight change.   Eyes:        See  hpi   Respiratory: Negative for cough and shortness of breath.    Cardiovascular: Negative for chest pain, palpitations and leg swelling.   Gastrointestinal: Negative for abdominal pain, diarrhea and constipation.   Endocrine: Negative for cold intolerance and heat intolerance.   Musculoskeletal: Positive for arthralgias. Negative for myalgias.   Hematological: Negative for adenopathy. Does not bruise/bleed easily.   Psychiatric/Behavioral: Negative for dysphoric mood. The patient is not nervous/anxious.        Objective:   Physical Exam   Constitutional: He is oriented to person, place, and time. He appears well-developed and well-nourished.   HENT:   Right Ear: External ear normal.   Left Ear: External ear normal.   Nose: Nose normal.   Mouth/Throat: Oropharynx is clear and moist.   Eyes: Pupils are equal, round, and reactive to light. No scleral icterus.   Neck: Normal range of motion. No JVD present.   Cardiovascular: Normal rate, regular rhythm and intact distal pulses.    2/6 sem    Pulmonary/Chest: Effort normal and breath sounds normal.   Abdominal: Soft. Bowel sounds are normal.   Musculoskeletal: He exhibits no edema.   Neurological: He is alert and oriented to person, place, and time.   Skin: Skin is warm and dry.   Psychiatric: He has a normal mood and affect. His  behavior is normal. Judgment and thought content normal.       Assessment:        ICD-10-CM ICD-9-CM    1. Type 2 diabetes mellitus without complication (HCC) E11.9 250.00 Basic Metabolic Panel   2. Cataract, left eye H26.9 366.9    3. Morbid obesity due to excess calories (HCC) E66.01 278.01    4. Sleep apnea, unspecified type G47.30 780.57    5. Acquired hypothyroidism E03.9 244.9 T4, free      TSH without Reflex   6. Chronic systolic congestive heart failure (HCC) I50.22 428.22 Basic Metabolic Panel     428.0            Plan:      See progress note.

## 2014-05-15 NOTE — Assessment & Plan Note (Signed)
Stable  Well compensated

## 2014-05-15 NOTE — Assessment & Plan Note (Signed)
Planned removal of cataract, iol  This patient appears to be in an acceptable condition for the proposed procedure.

## 2014-05-15 NOTE — Patient Instructions (Signed)
Eat and drink nothing after midnight the night before the planned procedure except for your valsartan/hctz, carvedilol, and omeprazole with just a sip of water on the morning of the procedure.      We discussed the Zostavax (shingles vaccination). Please talk to your pharmacist.  In the state [place], you do not need a prescription from me for the pharmacist to give you the vaccination.  Please wait until after the surgery before doing this.

## 2014-05-15 NOTE — Assessment & Plan Note (Signed)
Good control

## 2014-05-16 LAB — BASIC METABOLIC PANEL
Anion Gap: 17 — ABNORMAL HIGH (ref 3–16)
BUN: 31 mg/dL — ABNORMAL HIGH (ref 7–20)
CO2: 27 mmol/L (ref 21–32)
Calcium: 9.6 mg/dL (ref 8.3–10.6)
Chloride: 98 mmol/L — ABNORMAL LOW (ref 99–110)
Creatinine: 1.1 mg/dL (ref 0.8–1.3)
GFR African American: >60 (ref >60)
GFR Non-African American: >60 (ref >60)
Glucose: 129 mg/dL — ABNORMAL HIGH (ref 70–99)
Potassium: 4.6 mmol/L (ref 3.5–5.1)
Sodium: 142 mmol/L (ref 136–145)

## 2014-05-16 LAB — TSH: TSH: 1.2 u[IU]/mL (ref 0.27–4.20)

## 2014-05-16 LAB — T4, FREE: T4 Free: 1.7 ng/dL (ref 0.9–1.8)

## 2014-06-19 MED ORDER — METFORMIN HCL 500 MG PO TABS
500 MG | ORAL_TABLET | Freq: Two times a day (BID) | ORAL | Status: DC
Start: 2014-06-19 — End: 2014-09-15

## 2014-06-19 MED ORDER — LEVOTHYROXINE SODIUM 100 MCG PO TABS
100 MCG | ORAL_TABLET | ORAL | Status: DC
Start: 2014-06-19 — End: 2014-09-22

## 2014-06-19 MED ORDER — OMEPRAZOLE 40 MG PO CPDR
40 MG | ORAL_CAPSULE | ORAL | Status: DC
Start: 2014-06-19 — End: 2014-09-22

## 2014-06-19 MED ORDER — ATORVASTATIN CALCIUM 10 MG PO TABS
10 MG | ORAL_TABLET | ORAL | Status: DC
Start: 2014-06-19 — End: 2014-09-22

## 2014-06-19 MED ORDER — CARVEDILOL 25 MG PO TABS
25 MG | ORAL_TABLET | ORAL | Status: DC
Start: 2014-06-19 — End: 2014-09-22

## 2014-06-19 NOTE — Telephone Encounter (Signed)
Called patient.  He states that he meant Glucophage (Metformin) Not taking  glucovance  Reordered    HOWEVER.  He states that his blood sugars are running High.  140 to 180 and he feels he should go back to 2 pills twice a day.  Right now he is taking 500mg  2 times daily.  If increased is approved,  the quantity would have to be increased on the refill order today.  Explained to patient that Dr Dorris CarnesN might want him to come in for an office visit if the quantity is changed.

## 2014-06-19 NOTE — Telephone Encounter (Signed)
Last ov 05/15/14 preop

## 2014-06-19 NOTE — Telephone Encounter (Signed)
Why is he asking for glucovance??? Please ask the patient.

## 2014-06-19 NOTE — Telephone Encounter (Signed)
Left message to call back.

## 2014-07-07 IMAGING — CR CHEST 2 VWS PA LAT
1 series · 2 of 2 positions shown · non-contrast
Comparison: None

HISTORY/INDICATIONS: Essential (primary) hypertension
TECHNIQUE: Chest 2 views.

[Series 1: view not recorded · 0.17mm/px · 2 of 2 slices shown]
[im 1/2]
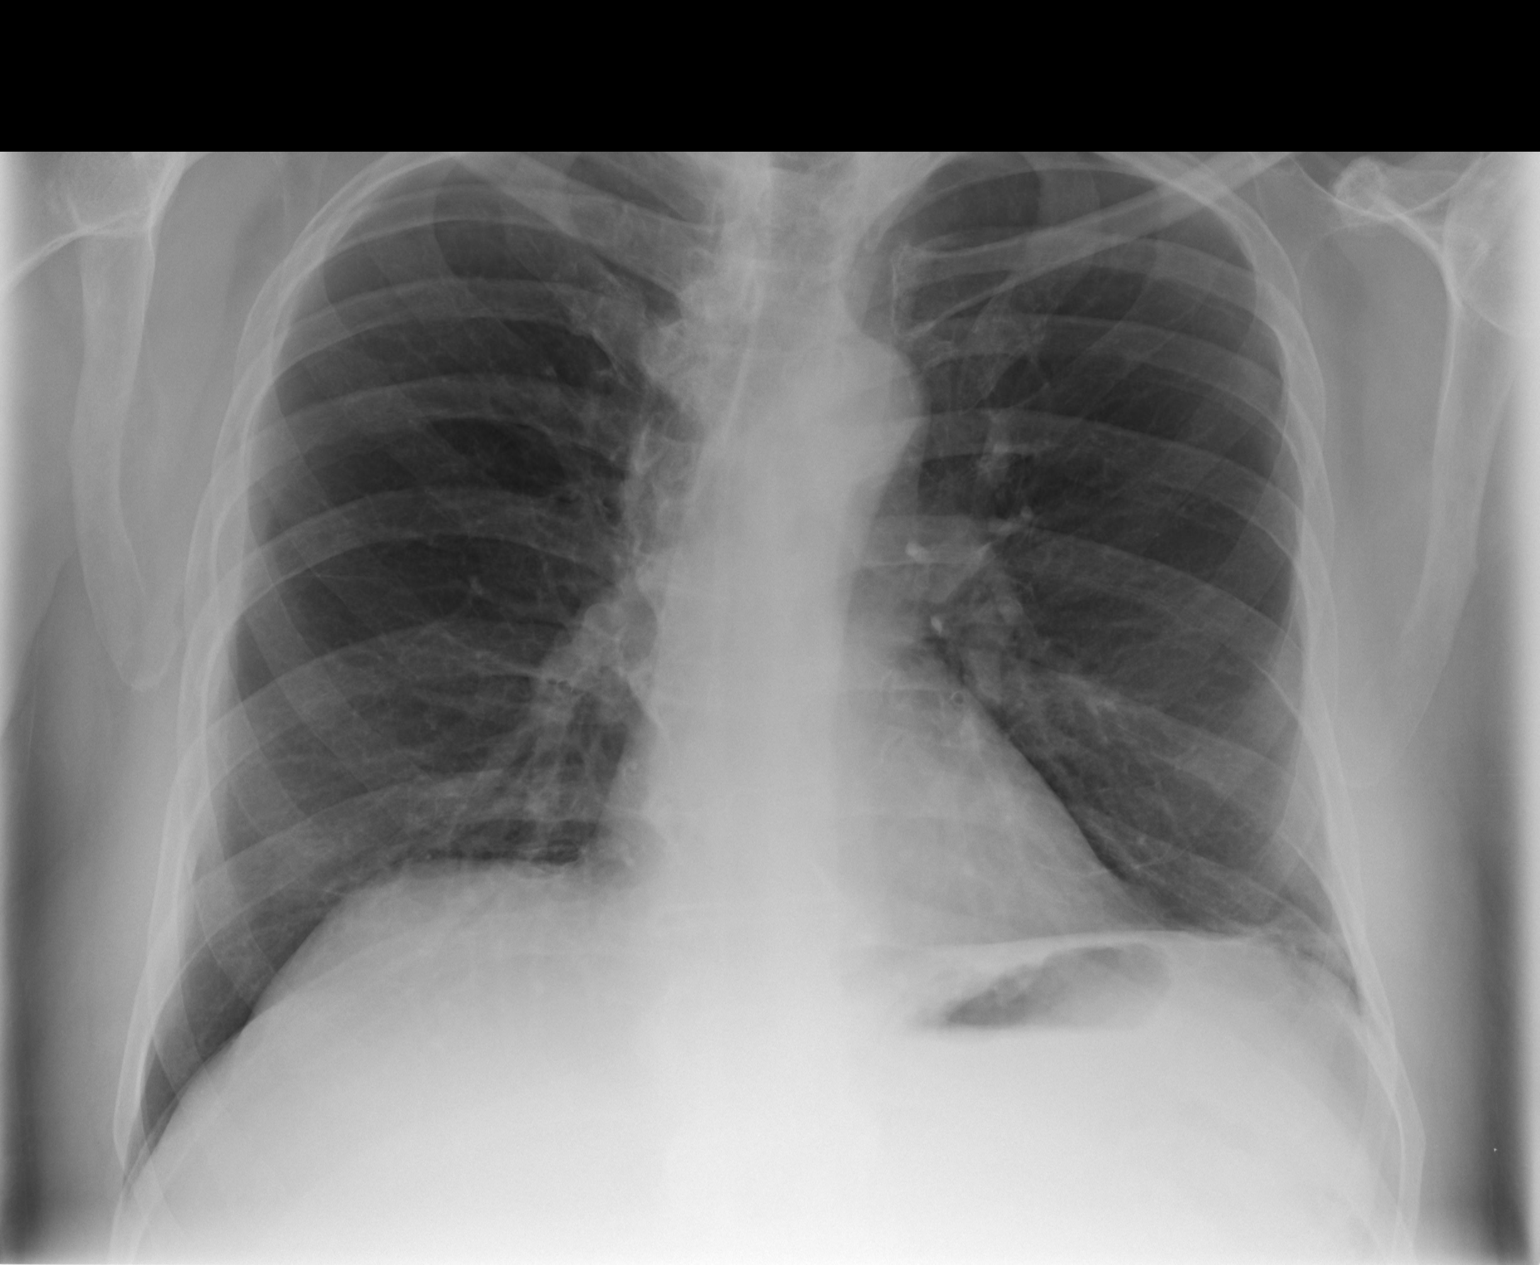
[im 2/2]
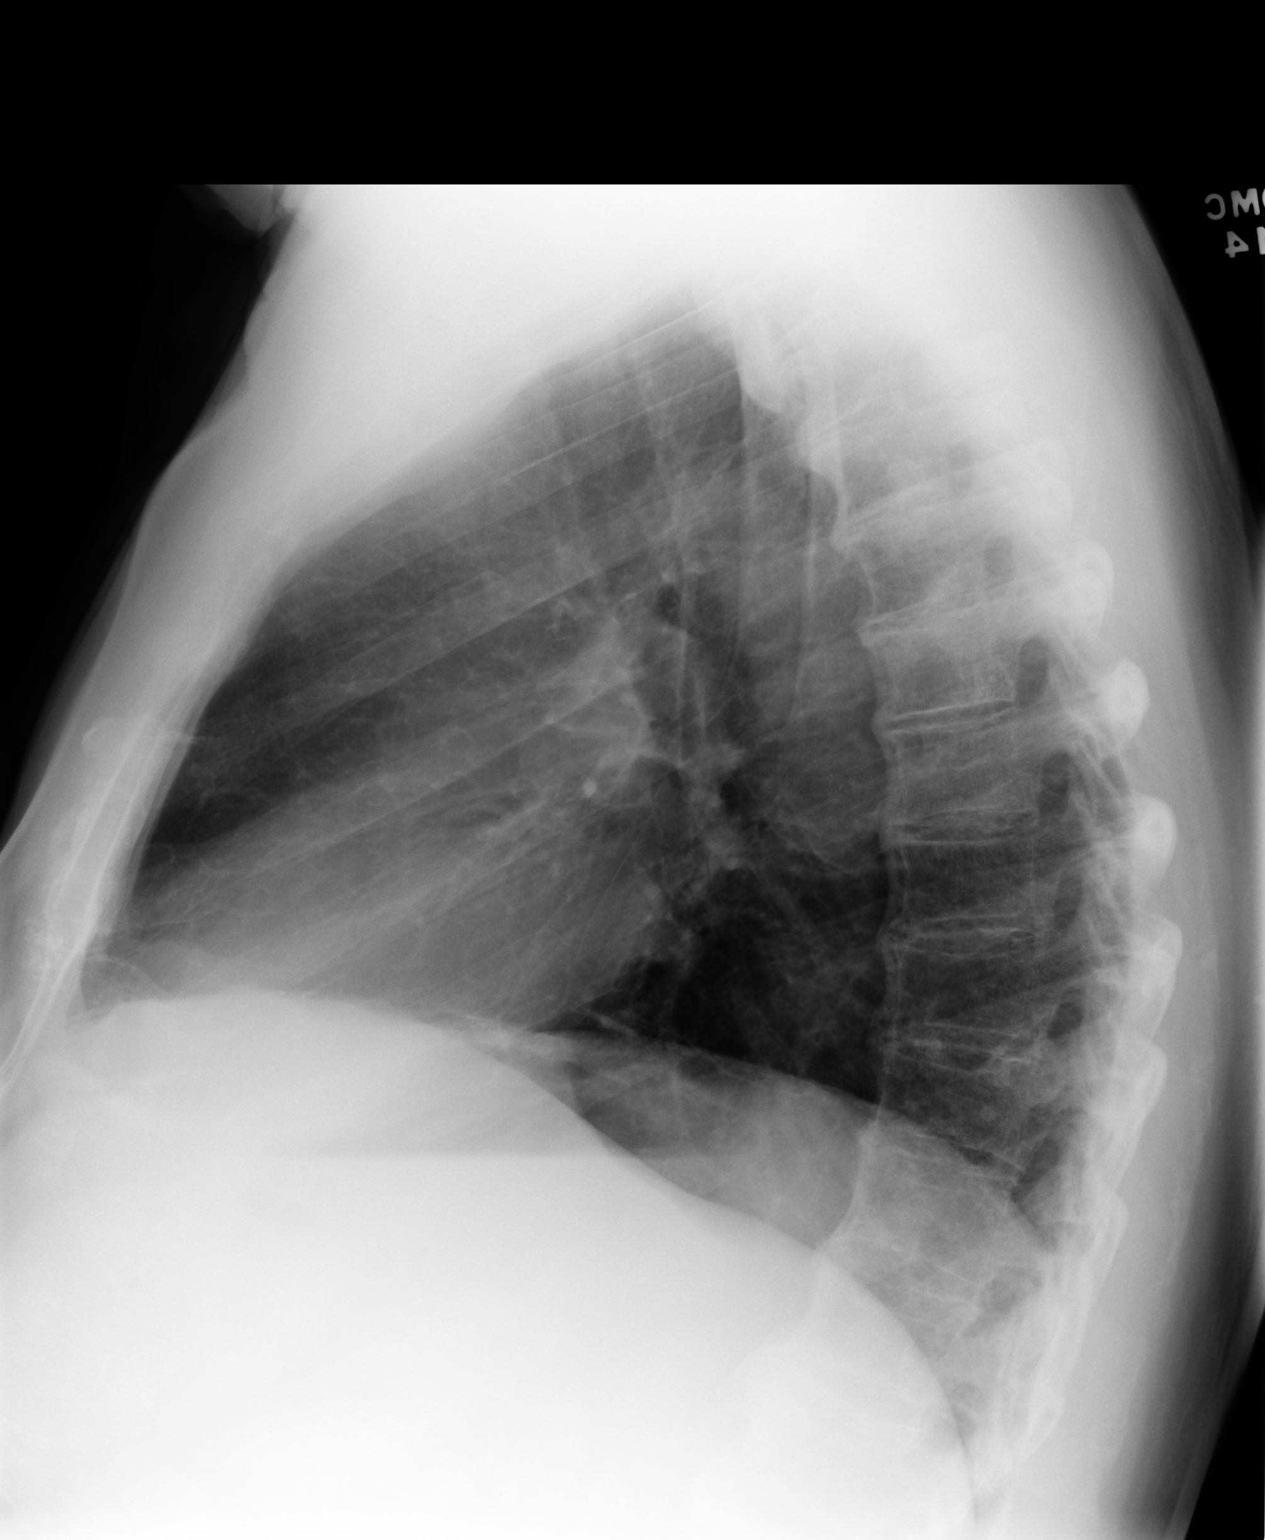

[2 of 2 positions shown; findings below may reference images not displayed]

FINDINGS: Heart size within normal limits. Pulmonary vascularity is normal. Mediastinal contour is unremarkable. Lung fields demonstrate mild left base atelectasis. No focal alveolar infiltrates. No pleural fluid. No pneumothorax. Degenerative changes about the shoulders and spine.  There is dextroscoliosis. There are flowing anterior vertebral body osteophytes suggesting DISH.
IMPRESSION: No acute findings. Scoliosis and degenerative changes of the spine. Flowing anterior vertebral body osteophytes suggesting DISH. Left base atelectasis.

## 2014-07-21 ENCOUNTER — Ambulatory Visit: Admit: 2014-07-21 | Payer: MEDICARE | Attending: Family

## 2014-07-21 DIAGNOSIS — G4733 Obstructive sleep apnea (adult) (pediatric): Secondary | ICD-10-CM

## 2014-07-21 NOTE — Unmapped (Signed)
Key points to remember  ?? You should wear the device every time you sleep, no matter when you sleep or where you sleep. When you nap or travel or go on vacation you need to use the machine when asleep.   ?? Bring your device, mask, hose, power cord etc to every office visit.   ?? If you sleep without the device your Obstructive Sleep Apnea (OSA) is untreated and your risk of heart attacks and strokes are higher.   ?? Clean the device on a regularly basis to avoid sinus infections and pneumonias   ?? Never travel with water in the reservoir   ?? Replace supplies on a regularly basis (see schedule below). You may need to contact your [employer] Medical [employer] company (DME) and request supplies (just like you would medications from a pharmacy).  ?? Alcohol will worsen OSA, Alcohol can decrease your drive to breathe, slowing your breathing and making your breaths shallow. In addition, it may relax the muscles of your throat, which may make it more likely for your upper airway to collapse. REMEMBER TO ALWAYS WEAR YOUR PAP DEVICE WHEN SLEEPING.   ?? Certain medications can also worsen OSA such as some pain medications, anti-anxiety medications, muscle relaxants, testosterone supplementation. Please inform your sleep provider which medications you take on a regular basis.   ?? All patients are advised to make follow up appointments with the provider after they are set up with their PAP device. Most insurance plans now require the patient set up a follow up appointment with the provider 31-90 days after they have been set up with their device and at least once a year thereafter. Discuss these issues with your provider for more details.   ?? Please note when you are provided with a mask you have a manufacturer 30 day guarantee that comes with it. If you do not like that mask for any reason contact your DME and request an exchange. After 30 days you may not be eligible for a new mask for another 3-6 months depending on insurance. Talk  to your DME or sleep provider for more details.   ?? If you are struggling with nasal congestion try to use a nasal sinus rinse available over the counter such as Netti Pot 1 hour before bedtime (you may have to use it daily for a 7-10 days then as needed). You can also try over the counter Flonase or Nasacort 1-2 sprays in each nostril at bedtime.   ?? If you ever have problems with the device or the plan discussed with your sleep provider please contact your provider by phone or using MyChart. Thank you      CLEANING INSTRUCTIONS FOR PAP EQUIPMENT     Keeping your equipment and supplies clean is very important.     REMINDER: Only use DISTILLED WATER in your humidifier, Empty water daily.    DAILY  Mask and tubing:   ?? Wash your face before applying mask  ?? Wash mask and tubing with baby shampoo and warm water.     Humidifier:   ?? Empty water in reservoir  ?? Clean with baby shampoo and warm water  ?? Rinse, then air dry    WEEKLY  Mask and tubing:   ?? Soak your mask and tubing in 1 part vinegar and 3 parts water for 30 minutes. Rinse, and allow to air dry.     Humidifier:   ?? Wash with warm water and baby shampoo  ?? Soak in   1 part vinegar and 3 parts water for 30 minutes  ?? Rinse with warm water and allow to air dry    Machine Exterior:   ?? Wipe with a clean damp cloth    MONTHLY AND/OR AS NEEDED  ?? Reusable foam filters (black filter)- wash in warm water with baby shampoo. Rinse well and dry with paper towel  ?? Disposable felt filter (white filter)- Replace filter every two weeks to once a month    NOTE: If you are having repeated sinus and /or respiratory infections, dirty equipment may be the cause. It may help to clean and disinfect your equipment more frequently    TRAVELING  Always make sure the humidifier is empty when traveling. (including doctor appointments, air travel or long distance driving).  When flying, always carry your PAP device with you as a carry on item, NEVER check it in as baggage. We can  provide you with a letter stating it is a medical device, talk to your provider.    Always make sure you have your mask and tubing with you. You will need appropriate plug adapters when traveling outside the [place].    If travelling by air and you are unable to carry distilled water with you use bottled water (no more than 2 weeks). DO NOT USE TAP WATER.       Equipment Replacement Schedule    To get the most benefit from your PAP therapy, your equipment should be replaced when necessary based on wear and tear. For example, your mask may need to be replaced if you notice it is cracked or the seal is leaking. If your tubing is torn, it needs to be replaced.   If your equipment is showing signs of wear, you may be entitled to replace it. The replacement schedule for Medicare patients is shown below. If you are NOT a Medicare patient, please check with your DME ([employer]) provider for your individual insurance policy???s replacement schedule.     Supplies   Medicare Medicaid My Insurance Plan  Mask    1 per 3 m 1 per year _______________  Nasal cushion  2 per m 1 per 6 m _______________  Pillows cushion  2 per m 1per 6 m _______________  Full-face cushion  1 per m 1per 6 m _______________  Headgear   1 per 6 m 1 per year _______________  Water Chamber  1 per 6 m N/A  _______________  Chinstrap   1 per 6 m 1 per 6 m       _______________  Tubing/Hose   1 per 3 m 1 per year _______________  Heater wire tubing  1 per 3 m N/A  _______________  Filter, disposable (white) 2 per m 1 per m _______________  Filter, particle foam (black) 1 per 6 m 4 per year _______________  Therapy device  1 per 5 yrs  ?  _______________    Follow up and compliance goals for Medicare and Medicaid patients   (most private insurances are now following similar rules)  Medicare and Medicaid require patients to follow-up with their provider after they are set up with their PAP equipment. The follow up appointment should be within  31-90 days after receiving their equipment. If patient does not follow up as directed there might be issues with the cost of the device being covered by the insurance. Therefore please ensure you make a follow up appointment with your sleep provider.      Insurance   also requires that the patient meet some compliance goals such as the download should indicate patient has been wearing the device for at least 4 hours per night AND they are using the device for 30 nights in a row.        Excessive daytime sleepiness/drowsiness    Excessive sleepiness is a situation where your brain is suffering from a lack of sleep or from a medication with sedative side effects. When you are sleepy you are at risk if you are operating a motor vehicle, other equipment or during critical decision-making processes.    Sleepiness is natural when you are at the end of the day near the time of your usual bedtime.  Sleepiness can occur at other times as well.  Inappropriate sleepiness can be caused by sleep disorders that degrade the quality of your sleep, by bad habits where you purposely or inadvertently short yourself with regard to adequate amounts of sleep, bad habits such as varying the time you go to bed and get up or, through no-fault of your own, when you are in a stressful situation and you have difficulty initiating and maintaining sleep at your usual time.    Many medications can have sleepiness or sedation as a side effect.  Common such medications include narcotic pain relievers (such as codeine, Percocet, Vicodin, morphine etc.), muscle relaxants (such as Flexeril or Soma), tranquilizers taken for anxiety (Ativan, Xanax, Valium etc.),  other tranquilizers ( such as Geodon, Risperdal, Haldol etc.) amongst many other medications.  There are other medications that can produce sedation which are less obvious such as prescription and over-the-counter antihistamines, Chantix, Requip, Mirapex, Sinemet, older antidepressants (e.g.  Elavil (amitriptyline), medications that are used to treat seizures and many others.    The nature of sleepiness is that the brain does not react as quickly as it would otherwise do when we are more rested.  Your reaction time will be slower.  You may find it harder to do a simple task and complex task might even allude you.  You can be irritable and have trouble interacting with other people.  In addition to the inattentiveness, memory issues and irritability you are at risk of inadvertently falling asleep.    If you follow asleep when you???re operating a motor vehicle the potential for a disaster is obvious.  If you are lucky, the rumble strip will wake you up.  Otherwise it may be the bridge abutment or the oncoming traffic.  When you fall asleep while driving you cannot will yourself back from sleep.  Your brain will wake up when it is refreshed (which will not happen if you???re behind the wheel of a car or operating other equipment) or when you???re awakened by whatever you hit.  When you get the warning signs of sleepiness such as head bobbing, eyelids drooping, relaxing back into the car seat you need to recognize these warning signs and stop driving or figure out a way to activate your brain to allow continued driving to be safe.  If you don???t get warning signals that you???re sleepy and you???ve had problems properly controlling your vehicle, have had near misses or accidents due to sleepiness then you should stop driving and not resume until the problem is adequately addressed.  It is you???re responsibility to curtail or eliminate you???re driving when you cannot stay awake.    If you???re an equipment operator, sleepiness can put you at risk for on-the-job injuries or death.  If you are too sleepy   to operate the equipment it is your responsibility and duty to stop.    In addition to risk of injury or death, sleepiness in the workplace can lead to reduced productivity, problems with interpersonal relations due to  irritability, and errors.  Depending on your job impairment due to sleepiness could have varying degrees of impact both to you, to the people around you, and perhaps to others.     If you???re under treatment for a qualitative problem with your sleep and you are still sleepy to the point you cannot safely drive, operate equipment necessary for your job or if you???re sleepiness is impairing your ability to make appropriate decisions then you need to make an appointment with your sleep provider to address this problem as soon as possible.  In the meantime, you need to undertake whatever measures are necessary to ensure your safety and the safety of others around you.        DROWSY DRIVING TIPS    These suggestions will help prevent you from the risk of drowsy driving.     1. If you feel tired or drowsy don't drive. Sleepiness is a major cause of motor vehicle accidents and accounts for 40% of all fatal crashes reported on the [place] thru[place]. No matter how much you think you can control sleepiness, you can't.     2. Ensure you follow your doctor's advice about the treatment for your sleep disorder. For example, if you have sleep apnea and use CPAP, ensure you use it fully the night before your trip.     3. Get a good night's sleep before driving. Do not cut yourself short of sleep if you plan a long drive the next day. Get to bed early and do not stay up late packing.     4. Avoid alcohol both the night before your trip and during your trip. Alcohol will disrupt sleep and make you more tired the next day. Sleepiness and alcohol are additive in increasing impairment of your driving ability.     5. Avoid any sedative medications, including sedative antihistamines that are often contained in cold or allergy medications, the night before you drive as they may have long lasting effects the next day.     6. Travel during non-sleeping hours. Accidents due to sleepiness are more common during the nighttime hours.     7. If sleepy,  stop and rest. Drink coffee and walk around. Take a brief nap, lock your car doors and take a nap in your car if you are sleepy. Have a 10-15 minute break after every 2 hours of driving.     8. Drive with a companion. Share the driving. Relax in the back seat until it is your time to share the driving again.     These are only guidelines; please discuss your diagnosis and treatment with your sleep medicine provider. Drive safely.       Sleep Hygiene     Sleep disruption is common, especially during times when you may feel emotionally overwhelmed. Anxieties, relentless replay of the day???s events and heightened emotions may significantly interfere with your sleep. Lack of sleep robs you of needed rest, making management of your illness more difficult.   Bring sleep patterns under control and working at a consistent, stable pattern is very important to illness management. You need your rest. The most common cause of insomnia is a change in your daily routine. For example traveling, change in work hours, disruption of other behaviors (  eating, exercise, leisure, etc.), relationship conflicts may cause sleep problems. Paying attention to good sleep hygiene is the most important thing you can do to maintain good sleep.     Do:   1. Go to bed at the same time each day.   2. Get up from bed at the same time each day.   3. Get regular exercise each day, preferably in the morning. There is good evidence that regular exercise improves restful sleep. This includes stretching and aerobic exercise.   4. Get regular exposure to outdoor or bright lights, especially in the late afternoon.   5. Keep the temperature in your bedroom comfortable.   6. Keep the bedroom quiet when sleeping   7. Keep the bedroom dark enough to facilitate sleep.   8. Use your bed only for sleep and sex.   9. Take medications as directed. It???s often helpful to take prescribed sleeping pills one hour before bedtime, so they are causing drowsiness when you lie  down, or 10 hours before getting up to avoid daytime drowsiness.   10. Use a relaxation exercise just before going to sleep, (muscle relaxation, imagery, massage, breathing exercises, warm baths).   11. Keep your feet and hands warm. Wear warm socks and/or mittens or gloves to bed.     Don???t:   1. Exercise just before going to bed.   2. Engage in stimulating activity just before bed, such as playing a competitive game, watching an exciting program on TV or movie, or having an important discussion with a loved one.   3. Have caffeine in the evening (coffee, many teas, chocolate, sodas, etc). No caffeine after noon.    4. Read or watch TV in bed.   5. Use alcohol to help you sleep.   6. Go to bed too hungry or too full.   7. Take another person???s sleeping pills.   8. Take over-the-counter sleeping pills without your doctor???s knowledge. Tolerance can develop rapidly with these medications. Diphenhydramine (an ingredient commonly found in over-the-counter sleep medications) can have serious side effects for elderly patients.   9. Take daytime naps.   10. Command yourself to go to sleep. This only makes your mind and body more alert.   11. Do not look at the time when you wake up in the middle of the night. Remove the clocks from the bedroom or set your alarm for the morning and have it face the wall.   If you lie in bed awake for more than 20-30 minutes, get up, go to a different room (or different part of the bedroom), and participate in a quiet activity (non-excitable reading or listen to soothing music) then return to bed when you feel sleepy. Do this as many times during the night as needed.

## 2014-07-21 NOTE — Unmapped (Signed)
~~~~~~~~~~~~~~~~~~~~~~~~~~~~~~~~~~~~~~~~~~~~~~~~~~~  Referring Provider: Dr Newman Pies    Chief Complaint: Follow up OSA on CPAP    Date of NPSG: 05/27/2003   SE %: 63.5   AHI: 52   Lowest p ox %: 73   NPSG performed at: [facility]     Date of CPAP: 12/06/2008   SE %: 76.5   AHI: 1.1   Lowest p ox %: 90.0   CPAP performed at: [facility] @ time of study: 265 lbs  DME Company: [employer]  Mask type: full face  cmH2O: 13       HPI: [name] a 73 y.o. male following up on OSAHS currently well managed on CPAP and is compliant. Likes his new device.  Since the last visit had left hand  carpel tunnel surgery and left eye cataract surgery. No other changes in his medical, surgical, social or family history or any hospitalizations or changes in medications since the last visit    Previous UPPP, oral venting and likes his FF mask    He is now walking 5 miles per day and has lost a significant amount of weight     HTN, DM, atrial fib - corrected with mini-maze procedure) GERD, dylipidemia      DEVICE/ MASK/ DME INFORMATION  DME: medical services  Mask type: Airfit F10 large FF mask    Compliance Data Download: 06/21/2014-07/20/2014  REsMEd  FF mask  PAP Pressure: min 7 max 14cm H2O  Percent Days with Device Usage: 93 %  Average ressure 13.5 cmH2O min10.5 max 13.9  Percent of Days with usage >= 4 hours: 90 %  Average Usage (number of hours used for all days): 6 hours 16 minutes   AHI: 9.4  Leak 24.1 max 65.6 L/min    Epworth Sleepiness Scale score is 5 (at or above 10 is abnormal and 24 is the maximum score),     Sleep Schedule  Bedtime is 12a. Falls asleep within 10 minutes, with 2 nighttime awakenings for bathroom. Falls back asleep in 10 minutes. Sleeps on own bed. Awakens at 7 during the week and 8a on the weekends.   Patient thinks they get approximately 6 hours of sleep a night.      Patient does takes naps 3 per week for 1 hr    Past Medical Hx:   Past Medical History   Diagnosis Date   ??? Atrial fibrillation      corrected  with Mini-maze procedure   ??? Hypercholesteremia    ??? Hypertension    ??? Dilated cardiomyopathy    ??? Diabetes mellitus      type 2   ??? Thyroid disease      hypothyroidism   ??? Sleep apnea    ??? Cataract      left eye   ??? Hearing loss      left ear     SurgHx:   Past Surgical History   Procedure Laterality Date   ??? Septoplasty     ??? Uvulopalatopharyngoplasty     ??? Hernia repair  9/09     right inguinal, mini maze   ??? Hernia repair  2/10     herniated lungs bilateral (repaired with mesh)   ??? Uvulopalatopharyngoplasty       has had esophageal dilations in the past (Dr.Decter)   ??? Wisdom tooth extraction     ??? Colonoscopy w/ polypectomy       benign  has at 3 plus procedures   ??? Mini-maze  corrected his A-fib     Problem List:  Patient Active Problem List   Diagnosis   ??? Insect Bite NEC   ??? Hemangioma of Intra-Abdominal Structures   ??? Other Specified Disorders of Liver   ??? Malignant Neoplasm of Head of Pancreas   ??? Disorder of esophagus   ??? Chest pain   ??? Contact dermatitis and other eczema, due to unspecified cause   ??? Morbid obesity   ??? Obstructive sleep apnea   ??? Hypothyroidism   ??? DM w/o Complication Type II   ??? Other and Unspecified Hyperlipidemia   ??? Essential hypertension   ??? Atrial Fibrillation   ??? Congestive heart failure   ??? Hemorrhoids   ??? Esophageal reflux   ??? Pain in Soft Tissues of Limb   ??? Special Screening for Malignant Neoplasm of Prostate   ??? Multiple rib fractures   ??? OSA (obstructive sleep apnea)     CURRENT MEDICATIONS:   Current Outpatient Prescriptions   Medication Sig   ??? acetaminophen Take 2 tablets (1,000 mg total) by mouth 3 times a day.   ??? aspirin Take 81 mg by mouth daily.   ??? atorvastatin Take 10 mg by mouth at bedtime.    ??? carvedilol Take 25 mg by mouth 2 (two) times daily.     ??? GLUC/CHON-MSM#1/C/MANG/BOS/BOR (OSTEO BI-FLEX ORAL) Take by mouth. OSTEO BI-FLEX JOINT SHIELD TABS    ??? glyBURIDE-metformin Take 2 tablets by mouth 2 times a day.    ??? levothyroxine Take 100 mcg by mouth every  morning.    ??? omeprazole Take 40 mg by mouth every morning.    ??? SAW PALMETTO XTR/ZINC PICOLIN (SAW PALMETTO EXTRACT ORAL) Take 2 tablets by mouth 2 times a day.    ??? valsartan-hydrochlorothiazide Take 1 tablet by mouth every morning.      No current facility-administered medications for this visit.      Allergies  Allergies as of 07/21/2014   ??? (No Known Allergies)     Family Hx:   Family History   Problem Relation Age of Onset   ??? Diabetes Mother      type 2   ??? Hypertension Mother    ??? Diabetes Sister      type 2   ??? Coronary artery disease Sister    ??? Coronary artery disease Brother    ??? Hypertension Brother    ??? Diabetes Brother      type 2   ??? Coronary artery disease Sister    ??? Hypertension Brother      Social Hx:   Patient  reports that he has never smoked. He does not have any smokeless tobacco history on file. He reports that he does not drink alcohol or use illicit drugs.  Please see attached form for further details.     Review of Systems  General ROS: all negative  Ophthalmic ROS: negative  Ears, nose, mouth, throat, and face ROS: negative  Respiratory ROS: all negative  Cardiovascular ROS: negative  Gastrointestinal ROS: negative  Genitourinary ZOX:WRUEAVWU for nocturia  Integument/breast ROS: negative  Hematologic/lymphatic ROS: negative  Musculoskeletal JWJ:XBJYNWGN  Neurological ROS: positive for numbness and tingling in extremities  Psychological ROS: all negative  Endocrine ROS: all  negative  Allergy and Immunology ROS: positive for - postnasal drip    Denies: suicidal ideas, irritability, depression, racing mind, changes in appetite, fainting spells, alcoholism, tremors, using illegal drugs, worrying about inability to sleep, insomnia, chest pain and shortness of breath,  I have reviewed the review of system and advised patient to contact primary care physician or specialist for non sleep related issues.    Previous Report(s) Reviewed: ER records, historical medical records, lab reports,  office notes and radiology reports     The following portions of the patient's history were reviewed and updated as appropriate: allergies, current medications, past family history, past medical history, past social history, past surgical history and problem list.    OBJECTIVE :   BP 150/82 mmHg   Pulse 66   Ht 6' (1.829 m)   Wt 241 lb 6.4 oz (109.498 kg)   BMI 32.73 kg/m2   SpO2 98%   Wt Readings from Last 3 Encounters:   07/21/14 241 lb 6.4 oz (109.498 kg)   04/14/14 234 lb (106.142 kg)   02/12/14 230 lb (104.327 kg)       Physical Exam   Constitutional: alert and in no apparent distress. Well-developed and well-nourished.   Head: normocephalic and atraumatic.   Ears: appear normal externally bilaterally  Eyes: Conjunctivae are normal. Pupils are equal, round, no scleral icterus, drainage or redness.   Nose: Columella normal. Positive for nasal congestion. No deviated septum.   Mouth : Mallampati 4. Crowded airway. Oropharynx is clear, moist and pink. UPPP,  Macroglossia w/ ridging. Dentition poor missing teeth.  Neck: Supple, grossly normal range of motion.  Trachea midline.    Cardiovascular: normal rate and regular rhythm. Neg murmur. No peripheral edema   Pulmonary/Chest: effort normal and breath sounds normal. No stridor. No respiratory distress, no wheezes, no rales.   Abdominal: deferred   Musculoskeletal: Grossly normal range of motion and strength. Normal gait. No edema and no tenderness.   Neurological:  alert. Cranial nerves II-XII grossly intact. Strength 5/5 in upper extremities bilaterally. Normal gait.  No tremor.   Skin: skin is warm and dry. No rash noted.   Psychiatric: normal speech, normal mood and affect, behavior is normal. Judgment and thought content normal.     Labs Reviewed:   No results found for: IRON  Lab Results   Component Value Date    WBC 8.7 02/07/2013    HGB 13.0* 02/07/2013    HCT 38.7 02/07/2013    MCV 84.7 02/07/2013    PLT 219 02/07/2013     Lab Results   Component Value Date     GLUCOSE 134* 02/18/2014    BUN 28* 02/18/2014    CO2 30 02/18/2014    CREATININE 0.93 02/18/2014    K 4.0 02/18/2014    NA 137 02/18/2014    CL 101 02/18/2014    CALCIUM 9.2 02/18/2014     Lab Results   Component Value Date    HGBA1C 7.2* 04/15/2008     Lab Results   Component Value Date    TSH 1.50 04/15/2008    FREET4 1.6 04/15/2008       ASSESSMENT  [name] a 74 y.o. male   OSA on CPAP and is complaint.     HTN, DM, atrial fib - corrected with mini-maze procedure) GERD, dylipidemia  UPPP- oral venting and likes his FF mask    PLAN:   1. OSA (obstructive sleep apnea)  Made pressure change and script written min 10 max 17 cmH2O. He will call the DME and have them send me a down load in a month.    Reviewed importance of using PAP to control OSA at all times when sleeping such as when travelling and taking  naps.     Patient was educated regarding OSAHS pathophysiology, relationship with cardiovascular, cerebrovascular and metabolic morbidity and mortality, issues related to PAP adherence, its use, cleaning and maintenance.  if any issues were to arise before their next office visit they may follow up sooner or contact their DME company for assistance.  Patient has been advised that anything preventing them from using the device on a daily basis is a concern that should be relayed to [place] their sleep provider. Patient verbalized understanding and agrees to comply.     The patient was educated on the dangers of drowsy driving; advised to avoid long distance or night driving. Instructed to take a short nap on the side of the road if sleepy when driving. Tips to avoid drowsy driving were provided to patient.     Sleep hygiene techniques were provided to patient.     Return to clinic in 8 months    Medical Decision Making:   The following items were considered in medical decision making:   Review / order other diagnostic tests/interventions   Risks & Benefits:   Risks, benefits and treatment options discussed  with patient.

## 2014-07-22 IMAGING — PT PET^1_PETCT_UltraHD (Adult)
1 of 3 series · 1 of 25 positions shown · non-contrast
Comparison: None.

INDICATION: Melanoma, two skin lesions excised, of the right shoulder diagnosed [DATE]. PET/CT scan is to stage melanoma.
TECHNIQUE: The patient's serum glucose was 94 mg/dl at time of the study.  The patient was intravenously injected with 12.83  mCi F-18 Fluorodeoxyglucose in a left antecubital vein.  The patient rested quietly for 74 minutes and then received attenuation corrected PET/CT imaging from the vertex to feet. PET, CT, and fused images were reviewed at the reading station. SUVmax is corrected on lean body mass. Image quality is acceptable for review.

[Series 3: ct pet 4.0 b30f · axial · 4.0mm · 0.98mm/px · 1 of 632 slices shown]
[im 345/632  soft-tissue]
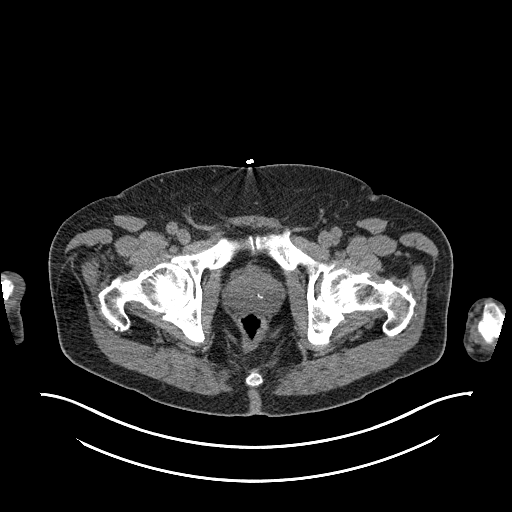

[1 of 25 positions shown; findings below may reference images not displayed]

FINDINGS: HEAD/NECK: 

PET: No malignant FDG activity is appreciated within the head or neck.

CT: Moderate arthritic changes are noted in the mid to lower cervical spine.

THORAX: 

PET:  The background mediastinal maximum SUV is 2.58.  Persistent FDG activity at the lateral proximal right upper arm below the deltoid muscle has maximum SUV of 4.4 and a 14 mm diameter. This appears to be full thickness of the dermis and has slight irregular surface appearance. Focus of pin-point FDG activity is demonstrated at the anterior left chest on CT image 161 with maximum SUV of 3.2 and a diameter of 9 mm. Clinical correlation of these 2 sites is encouraged to evaluate for need for possible additional biopsy.

GERD of the distal esophagus has maximum SUV of 3.4 near the esophageal hiatus.

CT: Diffuse arthritic changes of the thoracic spine are noted.

ABDOMEN/PELVIS:

PET:  The background mean liver SUV is 1.94. PERCIST threshold is 3.29. Prominent FDG activity is demonstrated in the skin of the left lower quadrant anterior abdomen on CT image 273 through 276. Largest focus has maximum SUV of 6.0 and greatest diameter of 29 mm. This appears to be full-thickness. Smaller more lateral focus has maximum SUV of 3.5 with a diameter of 10 mm. Both of these lesions appear to be full skin thickness. More inferior focus in the skin has maximum SUV of 4.2 with a diameter of 11 mm. Direct visualization of these skin lesions is encouraged to look for need for additional skin biopsy. Tumor involvement by melanoma is not excluded.

FDG activity within the kidneys, renal collecting systems, urinary bladder, remaining solid organs and bowel appears normal.

CT: Appendix is surgically absent. Diverticulosis is noted in the sigmoid colon without acute diverticulitis. Fatty inguinal hernias are noted bilaterally. Prostate is prominent in size. Low-grade atherosclerotic calcifications are seen within the abdominal aorta and its branches without aneurysm.

SKELETAL/EXTREMITIES:

PET: Inflammatory arthritic change is seen in the bilateral joints of the shoulders. There is some increased muscular activity in the anterior neck and pectoralis muscles of the anterior upper chest. No other abnormal skin lesions are seen within the extremities.

CT: Arthritic changes of the spine, shoulders, hips and to a lesser degree bilateral knees, small joints of the hands and small joints of the feet are seen.
IMPRESSION: 1. Additional abnormal skin activity involving the lateral upper right arm below the deltoid, left anterior chest medial to the left nipple and left anterior abdominal skin, as described above. Direct visualization is encouraged with additional skin biopsy, as warranted. Malignant melanoma is not excluded at these sites.

2. No convincing malignant melanoma spread to lymph nodes or distant sites is demonstrated on this exam.

3. Surgical absence of the appendix.

4. Age-related arthritic changes of the skeleton and atherosclerotic peripheral vascular disease are discussed above.

5. Prostate prominence appears age related. Bilateral fatty inguinal hernias are an incidental finding.

PERCIST reporting definitions and therapy response criteria, please click link below.

[URL]

## 2014-08-14 ENCOUNTER — Encounter: Attending: Internal Medicine | Primary: Internal Medicine

## 2014-09-15 MED ORDER — METFORMIN HCL 500 MG PO TABS
500 MG | ORAL_TABLET | Freq: Two times a day (BID) | ORAL | Status: DC
Start: 2014-09-15 — End: 2014-09-22

## 2014-09-22 ENCOUNTER — Ambulatory Visit: Admit: 2014-09-22 | Discharge: 2014-09-22 | Payer: MEDICARE | Attending: Internal Medicine | Primary: Internal Medicine

## 2014-09-22 DIAGNOSIS — E119 Type 2 diabetes mellitus without complications: Secondary | ICD-10-CM

## 2014-09-22 MED ORDER — METFORMIN HCL 1000 MG PO TABS
1000 MG | ORAL_TABLET | Freq: Two times a day (BID) | ORAL | Status: DC
Start: 2014-09-22 — End: 2015-03-01

## 2014-09-22 MED ORDER — LEVOTHYROXINE SODIUM 100 MCG PO TABS
100 MCG | ORAL_TABLET | ORAL | Status: DC
Start: 2014-09-22 — End: 2015-03-01

## 2014-09-22 MED ORDER — OMEPRAZOLE 40 MG PO CPDR
40 MG | ORAL_CAPSULE | ORAL | Status: DC
Start: 2014-09-22 — End: 2015-03-01

## 2014-09-22 MED ORDER — VALSARTAN-HYDROCHLOROTHIAZIDE 320-25 MG PO TABS
320-25 MG | ORAL_TABLET | Freq: Every day | ORAL | Status: DC
Start: 2014-09-22 — End: 2014-12-30

## 2014-09-22 MED ORDER — CARVEDILOL 25 MG PO TABS
25 MG | ORAL_TABLET | ORAL | Status: DC
Start: 2014-09-22 — End: 2015-03-01

## 2014-09-22 MED ORDER — ATORVASTATIN CALCIUM 10 MG PO TABS
10 MG | ORAL_TABLET | ORAL | Status: DC
Start: 2014-09-22 — End: 2015-03-01

## 2014-09-22 NOTE — Progress Notes (Signed)
BP Readings from Last 2 Encounters:   09/22/14 118/74   05/15/14 128/84     HEMOGLOBIN A1C (%)   Date Value   03/24/2014 5.9     A1C (no units)   Date Value   05/04/2010 6.8*     MICROALBUMIN, RANDOM URINE (mg/dL)   Date Value   51/01/2110 1.70     LDL CALCULATED (mg/dL)   Date Value   73/56/7014 74              Tobacco use:  Patient  reports that he has never smoked. He has never used smokeless tobacco.    If Smoker - Cessation materials given? No    Is Daily aspirin on medication list?:  Yes    Diabetic retinal exam done? Yes   If yes, document in Health Maintenance.     Monofilament placed on counter? Yes    Shoes and socks removed? Yes    BP taken with correct size cuff? Yes   Repeated if > 130/80 No     Microalbumin performed if applicable?  Yes

## 2014-09-22 NOTE — Assessment & Plan Note (Signed)
Refer gu  Check urine   discussed

## 2014-09-22 NOTE — Patient Instructions (Addendum)
Patient Self-Management Goal for Chronic Condition  Goal: I will take all medications as prescribed by my doctor, and I will call the office if I am having any medication problems.  Barriers to success: none  Plan for overcoming my barriers: N/A     Confidence: 10/10  Date goal set: 09/22/14  Date goal attained:     Our next steps depend on the blood work results.

## 2014-09-22 NOTE — Assessment & Plan Note (Signed)
Check a1c  Consider adding amaryl  Metformin is 1000 mg bid

## 2014-09-22 NOTE — Progress Notes (Signed)
Subjective:      Patient ID: Carl Blair is a 74 y.o. male.    HPI     Glucoses high lately  Doubled his metformin to 1000 mg bid one month ago - no change  Does only 2 hr pp glucose, discussed    Has regained most of his weight  Discussed  Resumed walking again recently  Feels better when does walk  Discussed    No chest pains/dyspnea    Normal temperature tolerance    Having some problems with bladder leakage  Not bad but uses depends at time  Not seen urologist  No blood in the urine        Review of Systems   Constitutional: Negative for fever, chills, appetite change and unexpected weight change.   Respiratory: Negative for shortness of breath and wheezing.    Cardiovascular: Negative for chest pain and leg swelling.   Gastrointestinal: Negative for abdominal pain, diarrhea and constipation.   Genitourinary: Negative for dysuria, hematuria and difficulty urinating.        See hpi   Musculoskeletal: Positive for arthralgias. Negative for myalgias.   Hematological: Negative for adenopathy. Does not bruise/bleed easily.       Objective:   Physical Exam   Constitutional: He is oriented to person, place, and time. He appears well-developed and well-nourished.   obese   Eyes: Pupils are equal, round, and reactive to light. No scleral icterus.   Neck: Normal range of motion. No JVD present.   Cardiovascular: Normal rate, regular rhythm and intact distal pulses.    Pulmonary/Chest: Effort normal and breath sounds normal.   Abdominal: Soft. Bowel sounds are normal.   Musculoskeletal: He exhibits no edema.   Neurological: He is alert and oriented to person, place, and time.   Psychiatric: He has a normal mood and affect. His behavior is normal. Judgment and thought content normal.       Assessment:        ICD-10-CM ICD-9-CM    1. Type 2 diabetes mellitus without complication, without long-term current use of insulin (HCC) E11.9 250.00 Hemoglobin A1C      Comprehensive Metabolic Panel   2. Pure hypercholesterolemia  E78.0 272.0 CK      Comprehensive Metabolic Panel      Lipid Panel   3. Essential hypertension I10 401.9 Comprehensive Metabolic Panel   4. Chronic systolic congestive heart failure (HCC) I50.22 428.22      428.0    5. Acquired hypothyroidism E03.9 244.9 T4, Free      TSH without Reflex   6. Urinary incontinence, unspecified type R32 788.30 Urinalysis      Urine Culture      Amb External Referral To Urology           Plan:      See progress note.

## 2014-09-22 NOTE — Assessment & Plan Note (Signed)
Good on arb/bb/diuretic  Stable  Cont med

## 2014-09-23 LAB — URINALYSIS
Bilirubin Urine: NEGATIVE
Blood, Urine: NEGATIVE
Glucose, Ur: NEGATIVE mg/dL
Ketones, Urine: NEGATIVE mg/dL
Leukocyte Esterase, Urine: NEGATIVE
Nitrite, Urine: NEGATIVE
Protein, UA: NEGATIVE mg/dL
Specific Gravity, UA: 1.026 (ref 1.005–1.030)
Urobilinogen, Urine: 0.2 E.U./dL (ref <2.0)
pH, UA: 6 (ref 5.0–8.0)

## 2014-09-23 LAB — COMPREHENSIVE METABOLIC PANEL
ALT: 19 U/L (ref 10–40)
AST: 16 U/L (ref 15–37)
Albumin/Globulin Ratio: 2 (ref 1.1–2.2)
Albumin: 4.3 g/dL (ref 3.4–5.0)
Alkaline Phosphatase: 91 U/L (ref 40–129)
Anion Gap: 17 — ABNORMAL HIGH (ref 3–16)
BUN: 27 mg/dL — ABNORMAL HIGH (ref 7–20)
CO2: 26 mmol/L (ref 21–32)
Calcium: 9.1 mg/dL (ref 8.3–10.6)
Chloride: 97 mmol/L — ABNORMAL LOW (ref 99–110)
Creatinine: 1 mg/dL (ref 0.8–1.3)
GFR African American: >60 (ref >60)
GFR Non-African American: >60 (ref >60)
Globulin: 2.1 g/dL
Glucose: 211 mg/dL — ABNORMAL HIGH (ref 70–99)
Potassium: 4.1 mmol/L (ref 3.5–5.1)
Sodium: 140 mmol/L (ref 136–145)
Total Bilirubin: 0.4 mg/dL (ref 0.0–1.0)
Total Protein: 6.4 g/dL (ref 6.4–8.2)

## 2014-09-23 LAB — T4, FREE: T4 Free: 1.6 ng/dL (ref 0.9–1.8)

## 2014-09-23 LAB — LIPID PANEL
Cholesterol, Total: 154 mg/dL (ref 0–199)
HDL: 33 mg/dL — ABNORMAL LOW (ref 40–60)
LDL Calculated: 94 mg/dL (ref <100)
Triglycerides: 134 mg/dL (ref 0–150)
VLDL Cholesterol Calculated: 27 mg/dL

## 2014-09-23 LAB — HEMOGLOBIN A1C
Hemoglobin A1C: 8.3 %
eAG: 191.5 mg/dL

## 2014-09-23 LAB — CK: Total CK: 161 U/L (ref 39–308)

## 2014-09-23 LAB — TSH: TSH: 1.54 u[IU]/mL (ref 0.27–4.20)

## 2014-09-23 MED ORDER — GLIMEPIRIDE 2 MG PO TABS
2 MG | ORAL_TABLET | Freq: Every day | ORAL | Status: DC
Start: 2014-09-23 — End: 2015-06-05

## 2014-09-23 MED ORDER — GLIMEPIRIDE 2 MG PO TABS
2 MG | ORAL_TABLET | Freq: Every day | ORAL | Status: DC
Start: 2014-09-23 — End: 2014-12-08

## 2014-09-24 LAB — CULTURE, URINE: Urine Culture, Routine: NO GROWTH

## 2014-12-08 MED ORDER — GLIMEPIRIDE 2 MG PO TABS
2 MG | ORAL_TABLET | ORAL | 0 refills | Status: DC
Start: 2014-12-08 — End: 2015-06-04

## 2014-12-08 NOTE — Telephone Encounter (Signed)
Last  Visit 09-22-2014

## 2014-12-31 MED ORDER — VALSARTAN-HYDROCHLOROTHIAZIDE 320-25 MG PO TABS
320-25 MG | ORAL_TABLET | Freq: Every day | ORAL | 1 refills | Status: DC
Start: 2014-12-31 — End: 2015-10-25

## 2015-02-23 ENCOUNTER — Ambulatory Visit: Admit: 2015-02-23 | Discharge: 2015-02-23 | Payer: MEDICARE | Attending: Family

## 2015-02-23 DIAGNOSIS — G4733 Obstructive sleep apnea (adult) (pediatric): Secondary | ICD-10-CM

## 2015-02-23 NOTE — Unmapped (Signed)
Key points to remember  ?? You should wear the device every time you sleep, no matter when you sleep or where you sleep. When you nap or travel or go on vacation you need to use the machine when asleep.   ?? Bring your device, mask, hose, power cord etc to every office visit.   ?? If you sleep without the device your Obstructive Sleep Apnea (OSA) is untreated and your risk of heart attacks and strokes are higher.   ?? Clean the device on a regularly basis to avoid sinus infections and pneumonias   ?? Never travel with water in the reservoir   ?? Replace supplies on a regularly basis (see schedule below). You may need to contact your [employer] Medical [employer] company (DME) and request supplies (just like you would medications from a pharmacy).  ?? Alcohol will worsen OSA, Alcohol can decrease your drive to breathe, slowing your breathing and making your breaths shallow. In addition, it may relax the muscles of your throat, which may make it more likely for your upper airway to collapse. REMEMBER TO ALWAYS WEAR YOUR PAP DEVICE WHEN SLEEPING.   ?? Certain medications can also worsen OSA such as some pain medications, anti-anxiety medications, muscle relaxants, testosterone supplementation. Please inform your sleep provider which medications you take on a regular basis.   ?? All patients are advised to make follow up appointments with the provider after they are set up with their PAP device. Most insurance plans now require the patient set up a follow up appointment with the provider 31-90 days after they have been set up with their device and at least once a year thereafter. Discuss these issues with your provider for more details.   ?? Please note when you are provided with a mask you have a manufacturer 30 day guarantee that comes with it. If you do not like that mask for any reason contact your DME and request an exchange. After 30 days you may not be eligible for a new mask for another 3-6 months depending on insurance. Talk  to your DME or sleep provider for more details.   ?? If you are struggling with nasal congestion try to use a nasal sinus rinse available over the counter such as Netti Pot 1 hour before bedtime (you may have to use it daily for a 7-10 days then as needed). You can also try over the counter Flonase or Nasacort 1-2 sprays in each nostril at bedtime.   ?? If you ever have problems with the device or the plan discussed with your sleep provider please contact your provider by phone or using MyChart. Thank you      CLEANING INSTRUCTIONS FOR PAP EQUIPMENT     Keeping your equipment and supplies clean is very important.     REMINDER: Only use DISTILLED WATER in your humidifier, Empty water daily.    DAILY  Mask and tubing:   ?? Wash your face before applying mask  ?? Wash mask and tubing with baby shampoo and warm water.     Humidifier:   ?? Empty water in reservoir  ?? Clean with baby shampoo and warm water  ?? Rinse, then air dry    WEEKLY  Mask and tubing:   ?? Soak your mask and tubing in 1 part vinegar and 3 parts water for 30 minutes. Rinse, and allow to air dry.     Humidifier:   ?? Wash with warm water and baby shampoo  ?? Soak in   1 part vinegar and 3 parts water for 30 minutes  ?? Rinse with warm water and allow to air dry    Machine Exterior:   ?? Wipe with a clean damp cloth    MONTHLY AND/OR AS NEEDED  ?? Reusable foam filters (black filter)- wash in warm water with baby shampoo. Rinse well and dry with paper towel  ?? Disposable felt filter (white filter)- Replace filter every two weeks to once a month    NOTE: If you are having repeated sinus and /or respiratory infections, dirty equipment may be the cause. It may help to clean and disinfect your equipment more frequently    TRAVELING  Always make sure the humidifier is empty when traveling. (including doctor appointments, air travel or long distance driving).  When flying, always carry your PAP device with you as a carry on item, NEVER check it in as baggage. We can  provide you with a letter stating it is a medical device, talk to your provider.    Always make sure you have your mask and tubing with you. You will need appropriate plug adapters when traveling outside the [place].    If travelling by air and you are unable to carry distilled water with you use bottled water (no more than 2 weeks). DO NOT USE TAP WATER.       Equipment Replacement Schedule    To get the most benefit from your PAP therapy, your equipment should be replaced when necessary based on wear and tear. For example, your mask may need to be replaced if you notice it is cracked or the seal is leaking. If your tubing is torn, it needs to be replaced.   If your equipment is showing signs of wear, you may be entitled to replace it. The replacement schedule for Medicare patients is shown below. If you are NOT a Medicare patient, please check with your DME ([employer]) provider for your individual insurance policy???s replacement schedule.     Supplies   Medicare Medicaid My Insurance Plan  Mask    1 per 3 m 1 per year _______________  Nasal cushion  2 per m 1 per 6 m _______________  Pillows cushion  2 per m 1per 6 m _______________  Full-face cushion  1 per m 1per 6 m _______________  Headgear   1 per 6 m 1 per year _______________  Water Chamber  1 per 6 m N/A  _______________  Chinstrap   1 per 6 m 1 per 6 m       _______________  Tubing/Hose   1 per 3 m 1 per year _______________  Heater wire tubing  1 per 3 m N/A  _______________  Filter, disposable (white) 2 per m 1 per m _______________  Filter, particle foam (black) 1 per 6 m 4 per year _______________  Therapy device  1 per 5 yrs  ?  _______________    Follow up and compliance goals for Medicare and Medicaid patients   (most private insurances are now following similar rules)  Medicare and Medicaid require patients to follow-up with their provider after they are set up with their PAP equipment. The follow up appointment should be within  31-90 days after receiving their equipment. If patient does not follow up as directed there might be issues with the cost of the device being covered by the insurance. Therefore please ensure you make a follow up appointment with your sleep provider.      Insurance   also requires that the patient meet some compliance goals such as the download should indicate patient has been wearing the device for at least 4 hours per night AND they are using the device for 30 nights in a row.        Excessive daytime sleepiness/drowsiness    Excessive sleepiness is a situation where your brain is suffering from a lack of sleep or from a medication with sedative side effects. When you are sleepy you are at risk if you are operating a motor vehicle, other equipment or during critical decision-making processes.    Sleepiness is natural when you are at the end of the day near the time of your usual bedtime.  Sleepiness can occur at other times as well.  Inappropriate sleepiness can be caused by sleep disorders that degrade the quality of your sleep, by bad habits where you purposely or inadvertently short yourself with regard to adequate amounts of sleep, bad habits such as varying the time you go to bed and get up or, through no-fault of your own, when you are in a stressful situation and you have difficulty initiating and maintaining sleep at your usual time.    Many medications can have sleepiness or sedation as a side effect.  Common such medications include narcotic pain relievers (such as codeine, Percocet, Vicodin, morphine etc.), muscle relaxants (such as Flexeril or Soma), tranquilizers taken for anxiety (Ativan, Xanax, Valium etc.),  other tranquilizers ( such as Geodon, Risperdal, Haldol etc.) amongst many other medications.  There are other medications that can produce sedation which are less obvious such as prescription and over-the-counter antihistamines, Chantix, Requip, Mirapex, Sinemet, older antidepressants (e.g.  Elavil (amitriptyline), medications that are used to treat seizures and many others.    The nature of sleepiness is that the brain does not react as quickly as it would otherwise do when we are more rested.  Your reaction time will be slower.  You may find it harder to do a simple task and complex task might even allude you.  You can be irritable and have trouble interacting with other people.  In addition to the inattentiveness, memory issues and irritability you are at risk of inadvertently falling asleep.    If you follow asleep when you???re operating a motor vehicle the potential for a disaster is obvious.  If you are lucky, the rumble strip will wake you up.  Otherwise it may be the bridge abutment or the oncoming traffic.  When you fall asleep while driving you cannot will yourself back from sleep.  Your brain will wake up when it is refreshed (which will not happen if you???re behind the wheel of a car or operating other equipment) or when you???re awakened by whatever you hit.  When you get the warning signs of sleepiness such as head bobbing, eyelids drooping, relaxing back into the car seat you need to recognize these warning signs and stop driving or figure out a way to activate your brain to allow continued driving to be safe.  If you don???t get warning signals that you???re sleepy and you???ve had problems properly controlling your vehicle, have had near misses or accidents due to sleepiness then you should stop driving and not resume until the problem is adequately addressed.  It is you???re responsibility to curtail or eliminate you???re driving when you cannot stay awake.    If you???re an equipment operator, sleepiness can put you at risk for on-the-job injuries or death.  If you are too sleepy   to operate the equipment it is your responsibility and duty to stop.    In addition to risk of injury or death, sleepiness in the workplace can lead to reduced productivity, problems with interpersonal relations due to  irritability, and errors.  Depending on your job impairment due to sleepiness could have varying degrees of impact both to you, to the people around you, and perhaps to others.     If you???re under treatment for a qualitative problem with your sleep and you are still sleepy to the point you cannot safely drive, operate equipment necessary for your job or if you???re sleepiness is impairing your ability to make appropriate decisions then you need to make an appointment with your sleep provider to address this problem as soon as possible.  In the meantime, you need to undertake whatever measures are necessary to ensure your safety and the safety of others around you.        DROWSY DRIVING TIPS    These suggestions will help prevent you from the risk of drowsy driving.     1. If you feel tired or drowsy don't drive. Sleepiness is a major cause of motor vehicle accidents and accounts for 40% of all fatal crashes reported on the [place] thru[place]. No matter how much you think you can control sleepiness, you can't.     2. Ensure you follow your doctor's advice about the treatment for your sleep disorder. For example, if you have sleep apnea and use CPAP, ensure you use it fully the night before your trip.     3. Get a good night's sleep before driving. Do not cut yourself short of sleep if you plan a long drive the next day. Get to bed early and do not stay up late packing.     4. Avoid alcohol both the night before your trip and during your trip. Alcohol will disrupt sleep and make you more tired the next day. Sleepiness and alcohol are additive in increasing impairment of your driving ability.     5. Avoid any sedative medications, including sedative antihistamines that are often contained in cold or allergy medications, the night before you drive as they may have long lasting effects the next day.     6. Travel during non-sleeping hours. Accidents due to sleepiness are more common during the nighttime hours.     7. If sleepy,  stop and rest. Drink coffee and walk around. Take a brief nap, lock your car doors and take a nap in your car if you are sleepy. Have a 10-15 minute break after every 2 hours of driving.     8. Drive with a companion. Share the driving. Relax in the back seat until it is your time to share the driving again.     These are only guidelines; please discuss your diagnosis and treatment with your sleep medicine provider. Drive safely.       Sleep Hygiene     Sleep disruption is common, especially during times when you may feel emotionally overwhelmed. Anxieties, relentless replay of the day???s events and heightened emotions may significantly interfere with your sleep. Lack of sleep robs you of needed rest, making management of your illness more difficult.   Bring sleep patterns under control and working at a consistent, stable pattern is very important to illness management. You need your rest. The most common cause of insomnia is a change in your daily routine. For example traveling, change in work hours, disruption of other behaviors (  eating, exercise, leisure, etc.), relationship conflicts may cause sleep problems. Paying attention to good sleep hygiene is the most important thing you can do to maintain good sleep.     Do:   1. Go to bed at the same time each day.   2. Get up from bed at the same time each day.   3. Get regular exercise each day, preferably in the morning. There is good evidence that regular exercise improves restful sleep. This includes stretching and aerobic exercise.   4. Get regular exposure to outdoor or bright lights, especially in the late afternoon.   5. Keep the temperature in your bedroom comfortable.   6. Keep the bedroom quiet when sleeping   7. Keep the bedroom dark enough to facilitate sleep.   8. Use your bed only for sleep and sex.   9. Take medications as directed. It???s often helpful to take prescribed sleeping pills one hour before bedtime, so they are causing drowsiness when you lie  down, or 10 hours before getting up to avoid daytime drowsiness.   10. Use a relaxation exercise just before going to sleep, (muscle relaxation, imagery, massage, breathing exercises, warm baths).   11. Keep your feet and hands warm. Wear warm socks and/or mittens or gloves to bed.     Don???t:   1. Exercise just before going to bed.   2. Engage in stimulating activity just before bed, such as playing a competitive game, watching an exciting program on TV or movie, or having an important discussion with a loved one.   3. Have caffeine in the evening (coffee, many teas, chocolate, sodas, etc). No caffeine after noon.    4. Read or watch TV in bed.   5. Use alcohol to help you sleep.   6. Go to bed too hungry or too full.   7. Take another person???s sleeping pills.   8. Take over-the-counter sleeping pills without your doctor???s knowledge. Tolerance can develop rapidly with these medications. Diphenhydramine (an ingredient commonly found in over-the-counter sleep medications) can have serious side effects for elderly patients.   9. Take daytime naps.   10. Command yourself to go to sleep. This only makes your mind and body more alert.   11. Do not look at the time when you wake up in the middle of the night. Remove the clocks from the bedroom or set your alarm for the morning and have it face the wall.   If you lie in bed awake for more than 20-30 minutes, get up, go to a different room (or different part of the bedroom), and participate in a quiet activity (non-excitable reading or listen to soothing music) then return to bed when you feel sleepy. Do this as many times during the night as needed.

## 2015-02-23 NOTE — Unmapped (Signed)
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 male with OSA on CPAP is doing well. No snoring, or no excessive sleepiness. He has no drowsy driving.  He thinks his mask maybe too small. It is leaking a fair amount. Maybe the pressure is too high when he wakes up.  He is getting his supplies and not having any problems with equipment or cleaning.    No other changes in his medical, surgical, social or family history or any hospitalizations or changes in medications since the last visit    Previous UPPP, oral venting and likes his FF mask    He is now walking 5 miles per day and has lost a significant amount of weight     HTN, DM, atrial fib - corrected with mini-maze procedure) GERD, dylipidemia      DEVICE/ MASK/ DME INFORMATION  DME: medical services  Mask type: Airfit F10 large FF mask    Compliance Data Download: 01/23/2015-02/22/2015  Device Model: RESMED S10  Mask: airfit F10 med  PAP Pressure: min 10 max 17 cm H2O average pressure 14.8   Percent Days with Device Usage: 100 %  Percent of Days with usage >= 4 hours: 100 %  Average Usage (number of hours used all days): 6 hrs 55 min  AHI: 3.7  Leak 30 average    Epworth Sleepiness Scale score is 5 (at or above 10 is abnormal and 24 is the maximum score),     EPWORTH SCALE     0 = no chance of dozing  1 = slight chance of dozing  2 = moderate chance of dozing  3 = high chance of dozing      SITUATION CHANCE OF DOZING  Sitting and reading 1  Watching TV 1  Sitting inactive in a public place (e.g a theater or a meeting  0  As a passenger in a car for  an hour without a break   0  Lying down to rest in the afternoon when circumstances permit 2  Sitting and talking to someone   0  Sitting quietly after a lunch without alcohol 1  In a car, while stopped for a few minutes in traffic  0      Sleep Schedule  Bedtime is 12a. Falls asleep within 10 minutes, with 1 nighttime awakenings for bathroom. Falls back asleep in 10 minutes. Sleeps on own bed. Awakens at 7 during the week and 8a on the weekends.   Patient thinks they get approximately 5 hours of sleep a night.      Patient does takes naps 4 per week for 35 min    Past Medical Hx:   Past Medical History   Diagnosis Date   ??? Atrial fibrillation      corrected with Mini-maze procedure   ??? Hypercholesteremia    ??? Hypertension    ??? Dilated cardiomyopathy    ???  Diabetes mellitus      type 2   ??? Thyroid disease      hypothyroidism   ??? Sleep apnea    ??? Cataract      left eye   ??? Hearing loss      left ear     SurgHx:   Past Surgical History   Procedure Laterality Date   ??? Septoplasty     ??? Uvulopalatopharyngoplasty     ??? Hernia repair  9/09     right inguinal, mini maze   ??? Hernia repair  2/10     herniated lungs bilateral (repaired with mesh)   ??? Uvulopalatopharyngoplasty       has had esophageal dilations in the past (Dr.Decter)   ??? Wisdom tooth extraction     ??? Colonoscopy w/ polypectomy       benign  has at 3 plus procedures   ??? Mini-maze       corrected his A-fib     Problem List:  Patient Active Problem List   Diagnosis   ??? Other, multiple, and unspecified sites, insect bite, nonvenomous, without mention of infection(919.4)   ??? Hemangioma of intra-abdominal structures   ??? Other specified disorders of liver   ??? Malignant neoplasm of head of pancreas   ??? Disorder of esophagus   ??? Chest pain   ??? Contact dermatitis and other eczema, due to unspecified cause   ??? Morbid obesity   ??? Obstructive sleep apnea   ??? Hypothyroidism   ??? Type II or unspecified type diabetes mellitus without mention of complication, not stated as  uncontrolled   ??? Other and unspecified hyperlipidemia   ??? Essential hypertension   ??? Atrial fibrillation   ??? Congestive heart failure   ??? Hemorrhoids   ??? Esophageal reflux   ??? Pain in limb   ??? Special screening for malignant neoplasm of prostate   ??? Multiple rib fractures   ??? OSA (obstructive sleep apnea)     CURRENT MEDICATIONS:   Current Outpatient Prescriptions   Medication Sig   ??? acetaminophen Take 2 tablets (1,000 mg total) by mouth 3 times a day.   ??? aspirin Take 81 mg by mouth daily.   ??? atorvastatin Take 10 mg by mouth at bedtime.    ??? carvedilol Take 25 mg by mouth 2 (two) times daily.     ??? glimepiride Take 1 tablet by mouth  every morning before  breakfast   ??? GLUC/CHON-MSM#1/C/MANG/BOS/BOR (OSTEO BI-FLEX ORAL) Take by mouth. OSTEO BI-FLEX JOINT SHIELD TABS    ??? glyBURIDE-metformin Take 2 tablets by mouth 2 times a day.    ??? levothyroxine Take 100 mcg by mouth every morning.    ??? omeprazole Take 40 mg by mouth every morning.    ??? SAW PALMETTO XTR/ZINC PICOLIN (SAW PALMETTO EXTRACT ORAL) Take 2 tablets by mouth 2 times a day.    ??? valsartan-hydrochlorothiazide Take 1 tablet by mouth every morning.      No current facility-administered medications for this visit.      Allergies  Allergies as of 02/23/2015   ??? (No Known Allergies)     Family Hx:   Family History   Problem Relation Age of Onset   ??? Diabetes Mother      type 2   ??? Hypertension Mother    ??? Diabetes Sister      type 2   ??? Coronary artery disease Sister    ??? Coronary artery disease Brother    ???  Hypertension Brother    ??? Diabetes Brother      type 2   ??? Coronary artery disease Sister    ??? Hypertension Brother      Social Hx:   Patient  reports that he has never smoked. He does not have any smokeless tobacco history on file. He reports that he does not drink alcohol or use illicit drugs.  Please see attached form for further details.     Review of Systems  General ROS: all negative  Ophthalmic ROS: negative  Ears, nose, mouth, throat, and face  ROS: negative  Respiratory ROS: all negative  Cardiovascular ROS: negative  Gastrointestinal ROS: negative  Genitourinary WGN:FAOZHYQM for nocturia  Integument/breast ROS: negative  Hematologic/lymphatic ROS: negative  Musculoskeletal VHQ:IONGEXBM  Neurological ROS: positive for numbness and tingling in extremities  Psychological ROS: all negative  Endocrine ROS: all  negative  Allergy and Immunology ROS: negative    Denies: suicidal ideas, irritability, depression, racing mind, changes in appetite, fainting spells, alcoholism, tremors, using illegal drugs, worrying about inability to sleep, insomnia, chest pain and shortness of breath,    I have reviewed the review of system and advised patient to contact primary care physician or specialist for non sleep related issues.    Previous Report(s) Reviewed: historical medical records, lab reports, office notes      The following portions of the patient's history were reviewed and updated as appropriate: allergies, current medications, past family history, past medical history, past social history, past surgical history and problem list.    OBJECTIVE :   BP 114/64 mmHg   Pulse 67   Ht 6' (1.829 m)   Wt 262 lb (118.842 kg)   BMI 35.53 kg/m2   SpO2 98%   Wt Readings from Last 3 Encounters:   02/23/15 262 lb (118.842 kg)   07/21/14 241 lb 6.4 oz (109.498 kg)   04/14/14 234 lb (106.142 kg)       Physical Exam   Constitutional: alert and in no apparent distress. Well-developed and well-nourished.   Head: normocephalic and atraumatic.   Ears: appear normal externally bilaterally  Eyes: Conjunctivae are normal. Pupils are equal, round, no scleral icterus, drainage or redness.   Nose: Columella normal. Positive for nasal congestion. No deviated septum.   Mouth : Mallampati 4. Crowded airway. Oropharynx is clear, moist and pink. UPPP,  Macroglossia w/ ridging. Dentition poor missing teeth.  Neck: Supple, grossly normal range of motion.  Trachea midline.    Cardiovascular: normal  rate and regular rhythm. Neg murmur. No peripheral edema   Pulmonary/Chest: effort normal and breath sounds normal. No stridor. No respiratory distress, no wheezes, no rales.   Abdominal: deferred   Musculoskeletal: Grossly normal range of motion and strength. Normal gait.   Neurological:  alert. Cranial nerves II-XII grossly intact. Strength 5/5 in upper extremities bilaterally. Normal gait.  No tremor.   Skin: skin is warm and dry. No rash noted. Multiple healing skin lesions on arms and face  Psychiatric: normal speech, normal mood and affect, behavior is normal. Judgment and thought content normal.     Labs Reviewed:   No results found for: IRON  Lab Results   Component Value Date    WBC 8.7 02/07/2013    HGB 13.0* 02/07/2013    HCT 38.7 02/07/2013    MCV 84.7 02/07/2013    PLT 219 02/07/2013     Lab Results   Component Value Date    GLUCOSE 134* 02/18/2014  BUN 28* 02/18/2014    CO2 30 02/18/2014    CREATININE 0.93 02/18/2014    K 4.0 02/18/2014    NA 137 02/18/2014    CL 101 02/18/2014    CALCIUM 9.2 02/18/2014     Lab Results   Component Value Date    HGBA1C 7.2* 04/15/2008     Lab Results   Component Value Date    TSH 1.50 04/15/2008    FREET4 1.6 04/15/2008       ASSESSMENT  Richard Minton Johnsonis a 74 y.o. male   OSA on CPAP and is compliant   BMI 36    HTN, DM, atrial fib - corrected with mini-maze procedure) GERD, dylipidemia  UPPP- oral venting and now using FF mask    PLAN:   1. OSA (obstructive sleep apnea)   pressure  min 10 max 17 cmH2O yearly script written  Turned the RAMP on to start at 6 for 15 min. It was off  Fit him for a large F10 to see if he likes that better. Reminded him of the 30 day mask guarantee.    DME : he will go over and have a new mask fit    Reviewed importance of using PAP to control OSA at all times when sleeping such as when travelling and taking naps.     Patient was educated regarding OSAHS pathophysiology, relationship with cardiovascular, cerebrovascular and metabolic  morbidity and mortality, issues related to PAP adherence, its use, cleaning and maintenance.  if any issues were to arise before their next office visit they may follow up sooner or contact their DME company for assistance.  Patient has been advised that anything preventing them from using the device on a daily basis is a concern that should be relayed to [place] their sleep provider. Patient verbalized understanding and agrees to comply.     Exercise and weight loss encouraged    The patient was educated on the dangers of drowsy driving; advised to avoid long distance or night driving. Instructed to take a short nap on the side of the road if sleepy when driving. Tips to avoid drowsy driving were provided to patient.     Sleep hygiene techniques were provided to patient.     Return to clinic in 1 year if doing well    OSA-Quality Measures    Follow-up visit:    Adherence to therapy documented at least annually?yes  Reassessment of excessive daytime sleepiness?yes  Weight/body mass index assessment at visit?yes  If body max index greater than 25 was discussion of weight management discussed?Yes  Blood pressure assessed at visit?Yes  If blood pressure elevated, was management of blood pressure discussed?Not Applicable    Medical Decision Making:   The following items were considered in medical decision making:   Review / order other diagnostic tests/interventions   Risks & Benefits:   Risks, benefits and treatment options discussed with patient.

## 2015-03-02 MED ORDER — OMEPRAZOLE 40 MG PO CPDR
40 MG | ORAL_CAPSULE | ORAL | 0 refills | Status: DC
Start: 2015-03-02 — End: 2015-04-20

## 2015-03-02 MED ORDER — CARVEDILOL 25 MG PO TABS
25 MG | ORAL_TABLET | ORAL | 0 refills | Status: DC
Start: 2015-03-02 — End: 2015-04-20

## 2015-03-02 MED ORDER — LEVOTHYROXINE SODIUM 100 MCG PO TABS
100 MCG | ORAL_TABLET | ORAL | 0 refills | Status: DC
Start: 2015-03-02 — End: 2015-04-20

## 2015-03-02 MED ORDER — GLIMEPIRIDE 2 MG PO TABS
2 MG | ORAL_TABLET | ORAL | 0 refills | Status: DC
Start: 2015-03-02 — End: 2015-06-04

## 2015-03-02 MED ORDER — METFORMIN HCL 1000 MG PO TABS
1000 MG | ORAL_TABLET | ORAL | 0 refills | Status: DC
Start: 2015-03-02 — End: 2015-04-20

## 2015-03-02 MED ORDER — ATORVASTATIN CALCIUM 10 MG PO TABS
10 MG | ORAL_TABLET | ORAL | 0 refills | Status: DC
Start: 2015-03-02 — End: 2015-04-20

## 2015-03-02 NOTE — Telephone Encounter (Signed)
Needs appt 03-2015

## 2015-03-02 NOTE — Telephone Encounter (Signed)
Last  Visit 09-22-2014

## 2015-03-06 ENCOUNTER — Inpatient Hospital Stay: Admit: 2015-03-06 | Payer: MEDICARE

## 2015-03-06 ENCOUNTER — Other Ambulatory Visit: Admit: 2015-03-06 | Payer: MEDICARE

## 2015-03-06 DIAGNOSIS — G5601 Carpal tunnel syndrome, right upper limb: Secondary | ICD-10-CM

## 2015-03-06 DIAGNOSIS — I471 Supraventricular tachycardia: Secondary | ICD-10-CM

## 2015-03-06 LAB — BASIC METABOLIC PANEL
Anion Gap: 7 mmol/L (ref 3–16)
BUN: 23 mg/dL (ref 7–25)
CO2: 29 mmol/L (ref 21–33)
Calcium: 9.2 mg/dL (ref 8.6–10.3)
Chloride: 100 mmol/L (ref 98–110)
Creatinine: 1.07 mg/dL (ref 0.60–1.30)
Glucose: 190 mg/dL (ref 70–100)
Osmolality, Calculated: 291 mOsm/kg (ref 278–305)
Potassium: 4.3 mmol/L (ref 3.5–5.3)
Sodium: 136 mmol/L (ref 133–146)
eGFR AA CKD-EPI: 79 See note.
eGFR NONAA CKD-EPI: 68 See note.

## 2015-03-06 NOTE — Unmapped (Signed)
ECG performed on patient [name] in outpatient.  Results faxed to ordering physicians office and on-call physician for ordering physician called with results.  Patient asymptomatic and cleared to go home.

## 2015-03-20 ENCOUNTER — Inpatient Hospital Stay: Admit: 2015-03-20 | Discharge: 2015-03-21 | Disposition: A | Discharge status: Home / Self Care | Payer: MEDICARE

## 2015-03-20 DIAGNOSIS — K5903 Drug induced constipation: Secondary | ICD-10-CM

## 2015-03-20 MED ORDER — senna-docusate (SENNA-S) 8.6-50 mg per tablet
8.6-50 | ORAL_TABLET | Freq: Every evening | ORAL | Status: AC
Start: 2015-03-20 — End: 2017-06-19

## 2015-03-20 MED ORDER — polyethylene glycol (GLYCOLAX) 17 gram/dose powder
17 | Freq: Two times a day (BID) | ORAL | 0.00 refills | 14.00000 days | Status: AC | PRN
Start: 2015-03-20 — End: 2017-06-19

## 2015-03-20 MED ORDER — S.M.O.G. enema
Freq: Once | Status: AC
Start: 2015-03-20 — End: 2015-03-20
  Administered 2015-03-20: 23:00:00 400 mL via RECTAL

## 2015-03-20 MED FILL — S.M.O.G. ENEMA 400 ML: 400.00 400.00 mL | Qty: 400

## 2015-03-20 NOTE — Unmapped (Signed)
[facility] ED Note    03/20/2015    Reason for Visit: Abdominal Pain and Constipation        Patient History     HPI:  [name] is a 74 y.o. White or Caucasian male with a PMH as described below who presents to the ED today with a chief complaint of constipation.  The patient recently had a carpal tunnel surgery on his right wrist and was given pain medication which she has been taking since the surgery.  He notes that since starting the pain medication he has not had a bowel movement.  He states that he feels like he needs to have a bowel movement but has been unable to.  He took 2 8 last night without any improvement in his symptoms.  He denies any nausea, vomiting, dysuria, chest pain, shortness of breath, and only states that he has a mild pressure-like sensation across his abdomen.    The patient states that there were no other associated signs and symptoms or modifying factors.    Patient rates pain as 4/10.    Past Medical History   Diagnosis Date   ??? Atrial fibrillation      corrected with Mini-maze procedure   ??? Hypercholesteremia    ??? Hypertension    ??? Dilated cardiomyopathy    ??? Diabetes mellitus      type 2   ??? Thyroid disease      hypothyroidism   ??? Sleep apnea    ??? Cataract      left eye   ??? Hearing loss      left ear       Past Surgical History   Procedure Laterality Date   ??? Septoplasty     ??? Uvulopalatopharyngoplasty     ??? Hernia repair  9/09     right inguinal, mini maze   ??? Hernia repair  2/10     herniated lungs bilateral (repaired with mesh)   ??? Uvulopalatopharyngoplasty       has had esophageal dilations in the past (Dr.Decter)   ??? Wisdom tooth extraction     ??? Colonoscopy w/ polypectomy       benign  has at 3 plus procedures   ??? Mini-maze       corrected his A-fib       Social History     Social History   ??? Marital Status: Married     Spouse Name: N/A   ??? Number of Children: N/A   ??? Years of Education: N/A     Occupational History   ???  Not on file.     Social History Main Topics   ??? Smoking status: Never Smoker    ??? Smokeless tobacco: Not on file      Comment: 03/19/07   ??? Alcohol Use: No      Comment: occasionally 04-14-11   ??? Drug Use: No      Comment: 04-14-11   ??? Sexual Activity: Not on file     Other Topics Concern   ??? Caffeine Use No   ??? Occupational Exposure Yes   ??? Exercise Yes   ??? Seat Belt Yes     Social History Narrative       family history includes Coronary artery disease in his brother, sister, and sister; Diabetes in his brother, mother, and sister; Hypertension in his brother, brother, and mother.    No Known Allergies    Discharge Medication List as of 03/20/2015  7:05 PM      CONTINUE these medications which have NOT CHANGED    Details   acetaminophen (TYLENOL) 500 MG tablet Take 2 tablets (1,000 mg total) by mouth 3 times a day., Starting 02/08/2013, Until Discontinued, Print      aspirin 81 MG EC tablet Take 81 mg by mouth daily., Until Discontinued, Historical Med      atorvastatin (LIPITOR) 10 MG tablet Take 10 mg by mouth at bedtime. , Until Discontinued, Historical Med      carvedilol (COREG) 25 MG tablet Take 25 mg by mouth 2 (two) times daily.  , Starting 04/15/2008, Until Discontinued, Historical Med      glimepiride (AMARYL) 2 MG tablet Take 1 tablet by mouth  every morning before  breakfast, Historical Med      GLUC/CHON-MSM#1/C/MANG/BOS/BOR (OSTEO BI-FLEX ORAL) Take by mouth. OSTEO BI-FLEX JOINT SHIELD TABS , Starting 11/28/2008, Until Discontinued, Historical Med      glyBURIDE-metformin (GLUCOVANCE) 2.5-500 mg per tablet Take 2 tablets by mouth 2 times a day. , Starting 04/16/2008, Until Discontinued, Historical Med      levothyroxine (SYNTHROID, LEVOTHROID) 100 MCG tablet Take 100 mcg by mouth every morning. , Starting 03/14/2006, Until Discontinued, Historical Med      omeprazole (PRILOSEC) 40 MG capsule Take 40 mg by mouth every morning. , Starting 04/15/2008, Until Discontinued, Historical Med      SAW PALMETTO XTR/ZINC  PICOLIN (SAW PALMETTO EXTRACT ORAL) Take 2 tablets by mouth 2 times a day. , Starting 03/13/2006, Until Discontinued, Historical Med      valsartan-hydrochlorothiazide (DIOVAN-HCT) 320-25 mg per tablet Take 1 tablet by mouth every morning. , Starting 10/08/2007, Until Discontinued, Historical Med               All nursing notes and triage notes were appropriately reviewed in the course of the creation of this note.     Review of Systems     ROS:  As described above in the HPI otherwise all the systems reviewed and negative as reported by the patient.      Physical Exam     Filed Vitals:    03/20/15 1628 03/20/15 1900   BP: 151/91 157/81   Pulse: 86 86   Temp: 97.9 ??F (36.6 ??C)    TempSrc: Oral    Resp: 18 16   Height: 6' (1.829 m)    Weight: 260 lb (117.935 kg)    SpO2: 98% 96%         Physical Exam   Constitutional: He is oriented to person, place, and time. He appears well-developed and well-nourished.   HENT:   Head: Normocephalic and atraumatic.   Cardiovascular: Normal rate and regular rhythm.    Pulmonary/Chest: Effort normal and breath sounds normal.   Abdominal: Soft. Bowel sounds are normal.   Minimal abdominal distention, normal bowel sounds heard during auscultation, mild diffuse abdominal tenderness.   Genitourinary:   On rectal exam there is soft stool far up in the rectal vault.  No gross blood noted   Musculoskeletal: He exhibits no edema.   Neurological: He is alert and oriented to person, place, and time.   Skin: Skin is warm.   Psychiatric: He has a normal mood and affect.   Nursing note and vitals reviewed.          Diagnostic Studies     Labs:    Please see electronic medical record for any tests performed in the ED     Labs Reviewed -  No data to display    Radiology:    Please see electronic medical record for any tests performed in the ED    None    EKG:    No EKG Performed      Emergency Department Procedures         ED Course and MDM     AMMIEL GUINEY is a 74 y.o. White or Caucasian male   who presented to the emergency department with Abdominal Pain and Constipation   who was examined by myself.  On examination, the patient has suspected opioid-induced constipation as he recently started taking a pain medication for his carpal tunnel status post surgery.  On rectal examination there soft stool felt in a believe that the patient would benefit from an enema.  He was given an enema in the emergency department with significant stool output and immediate relief of his abdominal pressure and bloating.  This time I believe the patient is safe for discharge home.  I will discharge the patient with a stronger bowel regimen with instructions to increase the medications until he is having 1-2 bowel movements per day and then decrease the medication until he is having a normal bowel movement for him.  He was cautioned on opioid usage and the results and constipation.    The patient was given my strict and standard return precautions as well as return precautions specific to the presenting diagnosis.  The patient voiced understanding with the above plan and was discharged home in good condition.        ED Treatment:  Medications   S.M.O.G. enema (400 mLs Rectal Given 03/20/15 1749)       Impression:  1) opioid-induced constipation    Plan:  1) discharge home status post enema with prescription for stronger bowel regimen following recent surgery with prescription for opioids and suspected opioid-induced constipation     Jami, Ohlin   Home Medication Instructions [id number]    Printed on:03/20/15 1721   Medication Information                      acetaminophen (TYLENOL) 500 MG tablet  Take 2 tablets (1,000 mg total) by mouth 3 times a day.             aspirin 81 MG EC tablet  Take 81 mg by mouth daily.             atorvastatin (LIPITOR) 10 MG tablet  Take 10 mg by mouth at bedtime.              carvedilol (COREG) 25 MG tablet  Take 25 mg by mouth 2 (two) times daily.               glimepiride (AMARYL) 2 MG  tablet  Take 1 tablet by mouth  every morning before  breakfast             GLUC/CHON-MSM#1/C/MANG/BOS/BOR (OSTEO BI-FLEX ORAL)  Take by mouth. OSTEO BI-FLEX JOINT SHIELD TABS              glyBURIDE-metformin (GLUCOVANCE) 2.5-500 mg per tablet  Take 2 tablets by mouth 2 times a day.              levothyroxine (SYNTHROID, LEVOTHROID) 100 MCG tablet  Take 100 mcg by mouth every morning.              omeprazole (PRILOSEC) 40 MG capsule  Take 40 mg by mouth every morning.  SAW PALMETTO XTR/ZINC PICOLIN (SAW PALMETTO EXTRACT ORAL)  Take 2 tablets by mouth 2 times a day.              valsartan-hydrochlorothiazide (DIOVAN-HCT) 320-25 mg per tablet  Take 1 tablet by mouth every morning.                  Future Appointments  Date Time Provider Department Center   02/22/2016 9:50 AM Donney Dice, CNP [facility]         Critical Care Time (Attendings)             Rudi Rummage, MD  03/23/15 1345

## 2015-03-20 NOTE — Unmapped (Signed)
Approximately half of enema administered, pt up to [facility] in room at this time, call light in reach and pt verbalizes understanding to push call light when pt is ready to finish the rest of the enema. Pt's wife at bedside.

## 2015-03-20 NOTE — Unmapped (Signed)
Pt finished with enema, had large BM.

## 2015-03-20 NOTE — Unmapped (Signed)
Pt up to [facility] again. Pt unable to hold enema in, noted leaking out before pt got on to [facility].

## 2015-03-20 NOTE — Unmapped (Signed)
Dr. Lacey Jensen at bedside.

## 2015-03-20 NOTE — Unmapped (Signed)
Pt received more enema, up to [facility], will inform this nurse when done, call light in reach.

## 2015-03-20 NOTE — Unmapped (Signed)
Pt. Presents to the ED with complaints of abd pain with constipation. Pt. States he has not had a BM for the past 3 days.

## 2015-03-23 NOTE — Other (Unsigned)
[facility]                                                        [facility] ED Note    03/20/2015    Reason for Visit: Abdominal Pain and Constipation        Patient History    HPI:  Carl Blair is a 74 y.o. White or Caucasian male with a PMH as  described below who presents to the ED today with a chief complaint of  constipation.  The patient recently had a carpal tunnel surgery on his right  wrist and was given pain medication which she has been taking since the   surgery.  He notes that since starting the pain medication he has not had a bowel  movement.  He states that he feels like he needs to have a bowel movement but  has been unable to.  He took 2 8 last night without any improvement in his  symptoms.  He denies any nausea, vomiting, dysuria, chest pain, shortness of  breath, and only states that he has a mild pressure-like sensation across his  abdomen.    The patient states that there were no other associated signs and symptoms or  modifying factors.    Patient rates pain as 4/10.    Past Medical History  Diagnosis Date   Atrial fibrillation    corrected with Mini-maze procedure   Hypercholesteremia   Hypertension   Dilated cardiomyopathy   Diabetes mellitus    type 2   Thyroid disease    hypothyroidism   Sleep apnea   Cataract    left eye   Hearing loss    left ear      Past Surgical History  Procedure Laterality Date   Septoplasty   Uvulopalatopharyngoplasty   Hernia repair  9/09    right inguinal, mini maze   Hernia repair  2/10    herniated lungs bilateral (repaired with mesh)   Uvulopalatopharyngoplasty    has had esophageal dilations in the past (Dr.Decter)   Wisdom tooth extraction   Colonoscopy w/ polypectomy    benign  has at 3 plus procedures   Mini-maze    corrected his A-fib      Social History    Social History   Marital Status: Married    Spouse Name: N/A   Number of Children: N/A   Years of Education: N/A    Occupational History   Not on file.    Social History Main Topics    Smoking status: Never Smoker   Smokeless tobacco: Not on file     Comment: 03/19/07   Alcohol Use: No     Comment: occasionally 04-14-11   Drug Use: No     Comment: 04-14-11   Sexual Activity: Not on file    Other Topics Concern   Caffeine Use No   Occupational Exposure Yes   Exercise Yes   Seat Belt Yes    Social History Narrative      family history includes Coronary artery disease in his brother, sister, and  sister; Diabetes in his brother, mother, and sister; Hypertension in his  brother, brother, and mother.    No Known Allergies    Discharge Medication List as of 03/20/2015  7:05 PM    CONTINUE  these medications which have NOT CHANGED   Details  acetaminophen (TYLENOL) 500 MG tablet Take 2 tablets (1,000 mg total) by mouth   3  times a day., Starting 02/08/2013, Until Discontinued, Print    aspirin 81 MG EC tablet Take 81 mg by mouth daily., Until Discontinued,  Historical Med    atorvastatin (LIPITOR) 10 MG tablet Take 10 mg by mouth at bedtime. , Until  Discontinued, Historical Med    carvedilol (COREG) 25 MG tablet Take 25 mg by mouth 2 (two) times daily.  ,  Starting 04/15/2008, Until Discontinued, Historical Med    glimepiride (AMARYL) 2 MG tablet Take 1 tablet by mouth  every morning before  breakfast, Historical Med    GLUC/CHON-MSM#1/C/MANG/BOS/BOR (OSTEO BI-FLEX ORAL) Take by mouth. OSTEO   BI-FLEX  JOINT SHIELD TABS , Starting 11/28/2008, Until Discontinued, Historical Med    glyBURIDE-metformin (GLUCOVANCE) 2.5-500 mg per tablet Take 2 tablets by mouth   2  times a day. , Starting 04/16/2008, Until Discontinued, Historical Med    levothyroxine (SYNTHROID, LEVOTHROID) 100 MCG tablet Take 100 mcg by mouth   every  morning. , Starting 03/14/2006, Until Discontinued, Historical Med    omeprazole (PRILOSEC) 40 MG capsule Take 40 mg by mouth every morning. ,  Starting 04/15/2008, Until Discontinued, Historical Med    SAW PALMETTO XTR/ZINC PICOLIN (SAW PALMETTO EXTRACT ORAL) Take 2 tablets by  mouth 2 times a day. ,  Starting 03/13/2006, Until Discontinued, Historical Med    valsartan-hydrochlorothiazide (DIOVAN-HCT) 320-25 mg per tablet Take 1 tablet   by  mouth every morning. , Starting 10/08/2007, Until Discontinued, Historical Med            All nursing notes and triage notes were appropriately reviewed in the course   of  the creation of this note.    Review of Systems    ROS:  As described above in the HPI otherwise all the systems reviewed and  negative as reported by the patient.      Physical Exam    Filed Vitals:   03/20/15 1628 03/20/15 1900  BP: 151/91 157/81  Pulse: 86 86  Temp: 97.9 deg F (36.6 deg C)  TempSrc: Oral  Resp: 18 16  Height: 6' (1.829 m)  Weight: 260 lb (117.935 kg)  SpO2: 98% 96%        Physical Exam  Constitutional: He is oriented to person, place, and time. He appears  well-developed and well-nourished.  HENT:  Head: Normocephalic and atraumatic.  Cardiovascular: Normal rate and regular rhythm.  Pulmonary/Chest: Effort normal and breath sounds normal.  Abdominal: Soft. Bowel sounds are normal.  Minimal abdominal distention, normal bowel sounds heard during auscultation,  mild diffuse abdominal tenderness.  Genitourinary:  On rectal exam there is soft stool far up in the rectal vault.  No gross blood  noted  Musculoskeletal: He exhibits no edema.  Neurological: He is alert and oriented to person, place, and time.  Skin: Skin is warm.  Psychiatric: He has a normal mood and affect.  Nursing note and vitals reviewed.          Diagnostic Studies    Labs:    Please see electronic medical record for any tests performed in the ED    Labs Reviewed - No data to display    Radiology:    Please see electronic medical record for any tests performed in the ED    None    EKG:    No EKG Performed  Emergency Department Procedures        ED Course and MDM    [name] is a 74 y.o. White or Caucasian male  who presented to the  emergency department with Abdominal Pain and Constipation   who was examined by  myself.  On examination, the patient has suspected  opioid-induced constipation as he recently started taking a pain medication   for  his carpal tunnel status post surgery.  On rectal examination there soft stool  felt in a believe that the patient would benefit from an enema.  He was given   an  enema in the emergency department with significant stool output and immediate  relief of his abdominal pressure and bloating.  This time I believe the   patient  is safe for discharge home.  I will discharge the patient with a stronger   bowel  regimen with instructions to increase the medications until he is having 1-2  bowel movements per day and then decrease the medication until he is having a  normal bowel movement for him.  He was cautioned on opioid usage and the   results  and constipation.    The patient was given my strict and standard return precautions as well as  return precautions specific to the presenting diagnosis.  The patient voiced  understanding with the above plan and was discharged home in good condition.        ED Treatment:  Medications  S.M.O.G. enema (400 mLs Rectal Given 03/20/15 1749)      Impression:  1) opioid-induced constipation    Plan:  1) discharge home status post enema with prescription for stronger bowel   regimen  following recent surgery with prescription for opioids and suspected  opioid-induced constipation     [name], Puneet R  Home Medication Instructions [id number]   Printed on:03/20/15 1721  Medication Information    acetaminophen (TYLENOL) 500 MG tablet  Take 2 tablets (1,000 mg total) by mouth 3 times a day.    aspirin 81 MG EC tablet  Take 81 mg by mouth daily.    atorvastatin (LIPITOR) 10 MG tablet  Take 10 mg by mouth at bedtime.    carvedilol (COREG) 25 MG tablet  Take 25 mg by mouth 2 (two) times daily.    glimepiride (AMARYL) 2 MG tablet  Take 1 tablet by mouth  every morning before  breakfast    GLUC/CHON-MSM#1/C/MANG/BOS/BOR (OSTEO BI-FLEX ORAL)  Take by mouth.  OSTEO BI-FLEX JOINT SHIELD TABS    glyBURIDE-metformin (GLUCOVANCE) 2.5-500 mg per tablet  Take 2 tablets by mouth 2 times a day.    levothyroxine (SYNTHROID, LEVOTHROID) 100 MCG tablet  Take 100 mcg by mouth every morning.    omeprazole (PRILOSEC) 40 MG capsule  Take 40 mg by mouth every morning.    SAW PALMETTO XTR/ZINC PICOLIN (SAW PALMETTO EXTRACT ORAL)  Take 2 tablets by mouth 2 times a day.    valsartan-hydrochlorothiazide (DIOVAN-HCT) 320-25 mg per tablet  Take 1 tablet by mouth every morning.        Future Appointments  Date Time Provider Department Center  02/22/2016 9:50 AM Donney DiceJulie Denise Knapp, CNP [facility]        Critical Care Time (Attendings)            Rudi Rummageobert C Doerning, MD  03/23/15 1345

## 2015-04-20 MED ORDER — GLIMEPIRIDE 2 MG PO TABS
2 MG | ORAL_TABLET | ORAL | 0 refills | Status: DC
Start: 2015-04-20 — End: 2015-06-04

## 2015-04-20 MED ORDER — ATORVASTATIN CALCIUM 10 MG PO TABS
10 MG | ORAL_TABLET | ORAL | 0 refills | Status: DC
Start: 2015-04-20 — End: 2015-07-14

## 2015-04-20 MED ORDER — LEVOTHYROXINE SODIUM 100 MCG PO TABS
100 MCG | ORAL_TABLET | ORAL | 0 refills | Status: DC
Start: 2015-04-20 — End: 2015-07-14

## 2015-04-20 MED ORDER — OMEPRAZOLE 40 MG PO CPDR
40 MG | ORAL_CAPSULE | ORAL | 0 refills | Status: DC
Start: 2015-04-20 — End: 2015-07-14

## 2015-04-20 MED ORDER — METFORMIN HCL 1000 MG PO TABS
1000 MG | ORAL_TABLET | ORAL | 0 refills | Status: DC
Start: 2015-04-20 — End: 2015-07-14

## 2015-04-20 MED ORDER — CARVEDILOL 25 MG PO TABS
25 MG | ORAL_TABLET | ORAL | 0 refills | Status: DC
Start: 2015-04-20 — End: 2015-07-14

## 2015-04-20 NOTE — Telephone Encounter (Signed)
Last OV 09/22/14  Last Filled 03/02/15  Appt None

## 2015-05-12 ENCOUNTER — Inpatient Hospital Stay: Admit: 2015-05-12 | Payer: MEDICARE

## 2015-05-12 DIAGNOSIS — M778 Other enthesopathies, not elsewhere classified: Secondary | ICD-10-CM

## 2015-05-12 MED ORDER — GADAVIST (gadobutrol) Soln 10 mL
1 | Freq: Once | INTRAVENOUS | Status: AC | PRN
Start: 2015-05-12 — End: 2015-05-12
  Administered 2015-05-13: 02:00:00 10 mL/kg via INTRAVENOUS

## 2015-05-13 LAB — RENAL FUNCTION PANEL W/EGFR
Albumin: 4.3 g/dL (ref 3.5–5.7)
Anion Gap: 8 mmol/L (ref 3–16)
BUN: 22 mg/dL (ref 7–25)
CO2: 29 mmol/L (ref 21–33)
Calcium: 9.5 mg/dL (ref 8.6–10.3)
Chloride: 99 mmol/L (ref 98–110)
Creatinine: 1.12 mg/dL (ref 0.60–1.30)
Glucose: 146 mg/dL (ref 70–100)
Osmolality, Calculated: 288 mOsm/kg (ref 278–305)
Phosphorus: 3.6 mg/dL (ref 2.1–4.5)
Potassium: 4.2 mmol/L (ref 3.5–5.3)
Sodium: 136 mmol/L (ref 133–146)
eGFR AA CKD-EPI: 75 See note.
eGFR NONAA CKD-EPI: 64 See note.

## 2015-06-04 ENCOUNTER — Ambulatory Visit: Admit: 2015-06-04 | Discharge: 2015-06-04 | Payer: MEDICARE | Attending: Internal Medicine | Primary: Internal Medicine

## 2015-06-04 DIAGNOSIS — E119 Type 2 diabetes mellitus without complications: Secondary | ICD-10-CM

## 2015-06-04 NOTE — Progress Notes (Signed)
Living Arrangements  Current living arrangements:  with family:  spouse    Health Literacy  Do you understand how to properly take your prescribed medications? (For example: how often and how many times a day you should take your medications)  Yes    Do you understand the reason(s) you were prescribed medication(s) by your provider?  Yes      Barriers to success: none  Plan for overcoming my barriers: N/A     Confidence: 8/10  Date goal set: 06/04/15  Date goal attained: Pt is going to [employer]

## 2015-06-04 NOTE — Patient Instructions (Signed)
Please bring both your glucose monitor and your glucose records to your next appointment.

## 2015-06-04 NOTE — Progress Notes (Signed)
Subjective:      Patient ID: Carl Blair is a 75 y.o. male.    HPI     Fasting for blood work    Dm, chf, cholesterol, thyroid    Glucoses discussed - better since losing weight again, had gained more  Discussed    Just had eye exam, pending result    No cv sx    Had surgery for the cts on right, not done good, thumb is numb, had fu mri        Review of Systems   Constitutional: Negative for chills, fever and unexpected weight change.   Respiratory: Negative for cough and shortness of breath.    Cardiovascular: Negative for chest pain, palpitations and leg swelling.   Gastrointestinal: Negative for abdominal pain, constipation and diarrhea.   Endocrine: Negative for cold intolerance and heat intolerance.   Musculoskeletal: Positive for arthralgias. Negative for myalgias.   Skin: Negative for rash and wound.   Neurological: Positive for numbness. Negative for weakness.   Hematological: Negative for adenopathy. Does not bruise/bleed easily.       Objective:   Physical Exam   Constitutional: He is oriented to person, place, and time. He appears well-developed and well-nourished.   Eyes: Pupils are equal, round, and reactive to light. No scleral icterus.   Cardiovascular: Normal rate, regular rhythm, normal heart sounds and intact distal pulses.    Pulmonary/Chest: Effort normal and breath sounds normal.   Abdominal: Soft. Bowel sounds are normal.   Musculoskeletal: He exhibits no edema.   Feet -- no sores seen  Normal pulses  Filament test abnormal bilaterally - missed a couple touches but felt most     Neurological: He is alert and oriented to person, place, and time.   Psychiatric: He has a normal mood and affect. His behavior is normal. Judgment and thought content normal.       Assessment:        ICD-10-CM ICD-9-CM    1. Type 2 diabetes mellitus without complication, without long-term current use of insulin (HCC) E11.9 250.00 Microalbumin / Creatinine Urine Ratio      Hemoglobin A1C      HM DIABETES FOOT EXAM       Comprehensive Metabolic Panel   2. Acquired hypothyroidism E03.9 244.9 T4, Free      TSH without Reflex   3. Essential hypertension I10 401.9 Comprehensive Metabolic Panel   4. Pure hypercholesterolemia E78.00 272.0 Comprehensive Metabolic Panel      Lipid Panel   5. Chronic systolic congestive heart failure (HCC) I50.22 428.22      428.0    6. Morbid obesity due to excess calories (HCC) E66.01 278.01    7. Need for hepatitis C screening test Z11.59 V73.89 Hepatitis C Antibody   8. Prostate cancer screening Z12.5 V76.44 Psa screening          Plan:      Dm 2 - should be better - check lab    Thyroid - check lab    htn - good, cont med    Cholesterol - checking lab    chf - stable - no change    Weight - better - working on it Doylene Bode)

## 2015-06-05 LAB — COMPREHENSIVE METABOLIC PANEL
ALT: 14 U/L (ref 10–40)
AST: 14 U/L — ABNORMAL LOW (ref 15–37)
Albumin/Globulin Ratio: 1.9 (ref 1.1–2.2)
Albumin: 4.3 g/dL (ref 3.4–5.0)
Alkaline Phosphatase: 99 U/L (ref 40–129)
Anion Gap: 14 (ref 3–16)
BUN: 22 mg/dL — ABNORMAL HIGH (ref 7–20)
CO2: 27 mmol/L (ref 21–32)
Calcium: 9.5 mg/dL (ref 8.3–10.6)
Chloride: 99 mmol/L (ref 99–110)
Creatinine: 1 mg/dL (ref 0.8–1.3)
GFR African American: >60 (ref >60)
GFR Non-African American: >60 (ref >60)
Globulin: 2.3 g/dL
Glucose: 191 mg/dL — ABNORMAL HIGH (ref 70–99)
Potassium: 5.2 mmol/L — ABNORMAL HIGH (ref 3.5–5.1)
Sodium: 140 mmol/L (ref 136–145)
Total Bilirubin: 0.3 mg/dL (ref 0.0–1.0)
Total Protein: 6.6 g/dL (ref 6.4–8.2)

## 2015-06-05 LAB — LIPID PANEL
Cholesterol, Total: 147 mg/dL (ref 0–199)
HDL: 26 mg/dL — ABNORMAL LOW (ref 40–60)
LDL Calculated: 89 mg/dL (ref <100)
Triglycerides: 160 mg/dL — ABNORMAL HIGH (ref 0–150)
VLDL Cholesterol Calculated: 32 mg/dL

## 2015-06-05 LAB — HEMOGLOBIN A1C
Hemoglobin A1C: 7.7 %
eAG: 174.3 mg/dL

## 2015-06-05 LAB — TSH: TSH: 1.53 u[IU]/mL (ref 0.27–4.20)

## 2015-06-05 LAB — HEPATITIS C ANTIBODY: Hep C Ab Interp: NONREACTIVE

## 2015-06-05 LAB — PSA SCREENING: PSA: 0.75 ng/mL (ref 0.00–4.00)

## 2015-06-05 LAB — T4, FREE: T4 Free: 1.5 ng/dL (ref 0.9–1.8)

## 2015-06-05 LAB — MICROALBUMIN / CREATININE URINE RATIO
Creatinine, Ur: 191.7 mg/dL (ref 39.0–259.0)
Microalbumin Creatinine Ratio: 7.3 mg/g (ref 0.0–30.0)
Microalbumin, Random Urine: 1.4 mg/dL (ref <2.0)

## 2015-06-05 MED ORDER — GLIMEPIRIDE 2 MG PO TABS
2 | ORAL_TABLET | Freq: Two times a day (BID) | ORAL | 1 refills | 90.00000 days | Status: DC
Start: 2015-06-05 — End: 2015-10-25

## 2015-07-14 MED ORDER — ATORVASTATIN CALCIUM 10 MG PO TABS
10 MG | ORAL_TABLET | ORAL | 1 refills | Status: DC
Start: 2015-07-14 — End: 2015-12-17

## 2015-07-14 MED ORDER — METFORMIN HCL 1000 MG PO TABS
1000 MG | ORAL_TABLET | ORAL | 1 refills | Status: DC
Start: 2015-07-14 — End: 2015-12-17

## 2015-07-14 MED ORDER — LEVOTHYROXINE SODIUM 100 MCG PO TABS
100 MCG | ORAL_TABLET | ORAL | 1 refills | Status: DC
Start: 2015-07-14 — End: 2015-12-17

## 2015-07-14 MED ORDER — OMEPRAZOLE 40 MG PO CPDR
40 MG | ORAL_CAPSULE | ORAL | 1 refills | Status: DC
Start: 2015-07-14 — End: 2015-12-17

## 2015-07-14 MED ORDER — CARVEDILOL 25 MG PO TABS
25 MG | ORAL_TABLET | ORAL | 1 refills | Status: DC
Start: 2015-07-14 — End: 2015-12-17

## 2015-07-14 NOTE — Telephone Encounter (Signed)
Last OV 06/04/15  Last Filled 04/20/15  Appt None

## 2015-09-21 IMAGING — US US AXILLARY LT (PENRAD)
1 series · 11 of 11 positions shown · non-contrast
Comparison: none

Images Obtained from [facility]
CLINICAL RA REF: Ultrasound of the left axilla for palpable abnormality.
No prior exams were available for comparison.
Real-time and Doppler ultrasound of the left axilla were performed on the areas of interest.
No abnormal left axillary nodes were seen. The left axillary nodes are fatty replaced without cortical thickening. No masses or fluid collections were seen in the left axilla.

[Series 1: us axillary left (penrad) · 11 of 11 slices shown]
[im 1/11]
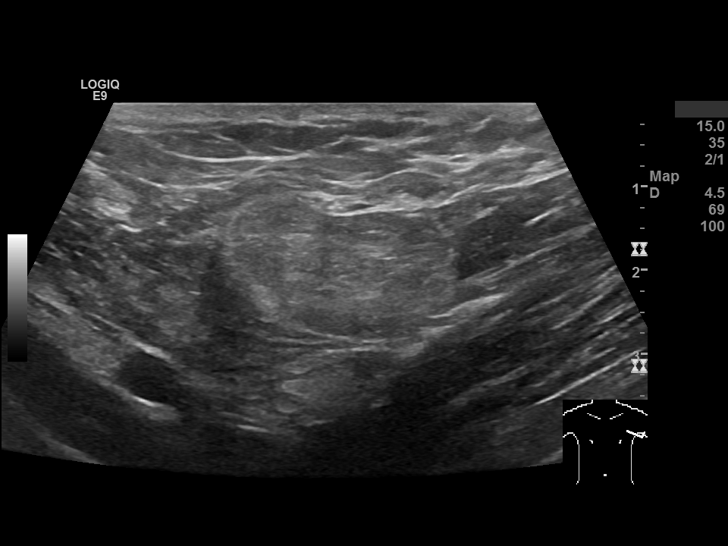
[im 2/11]
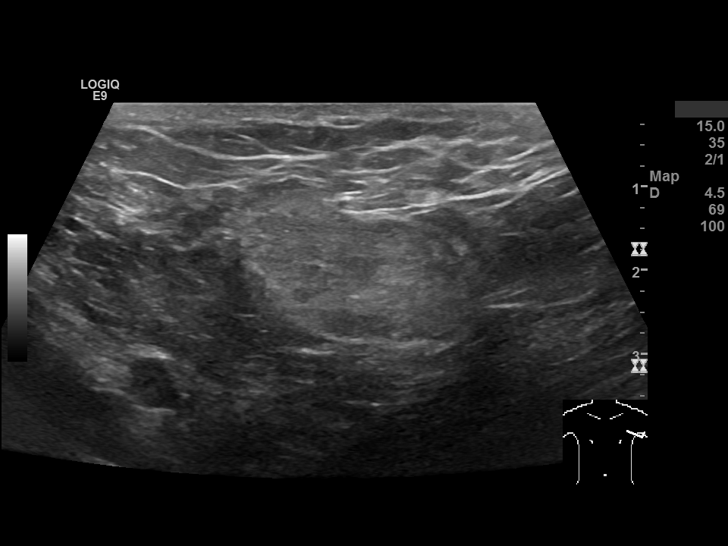
[im 3/11]
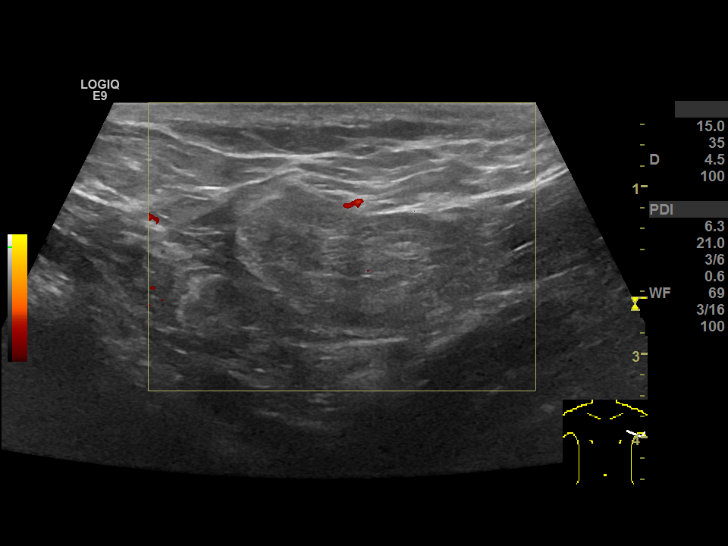
[im 4/11]
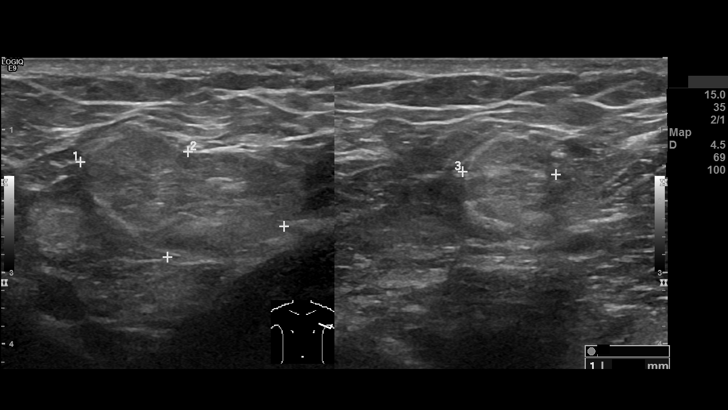
[im 5/11]
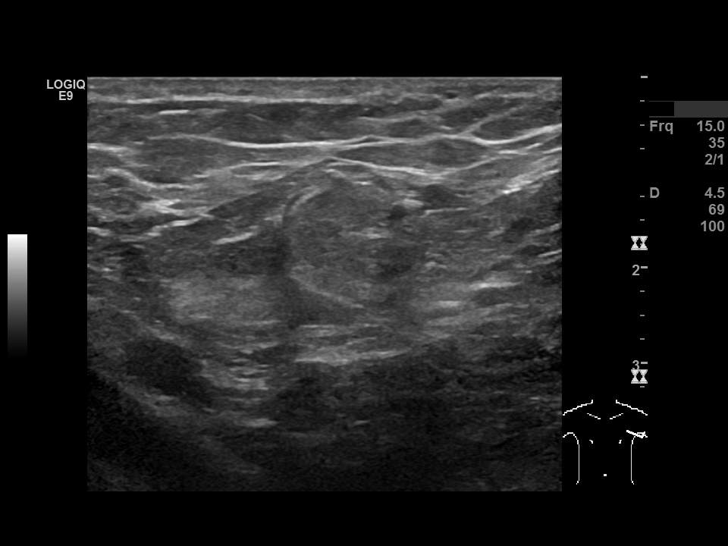
[im 6/11]
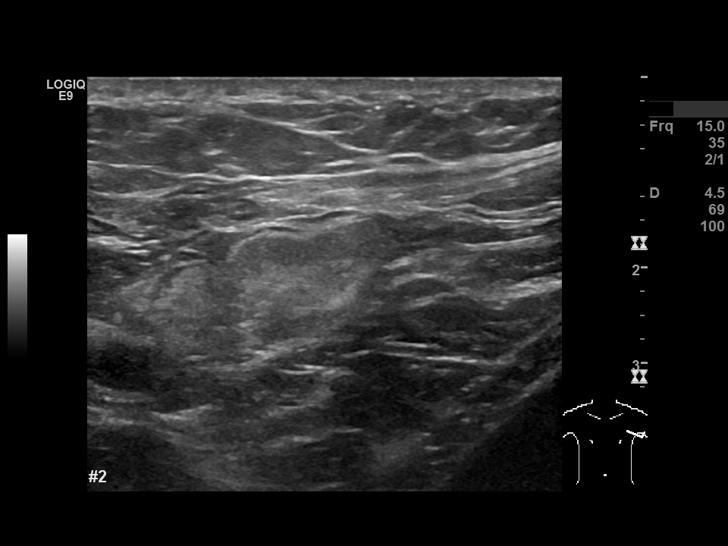
[im 7/11]
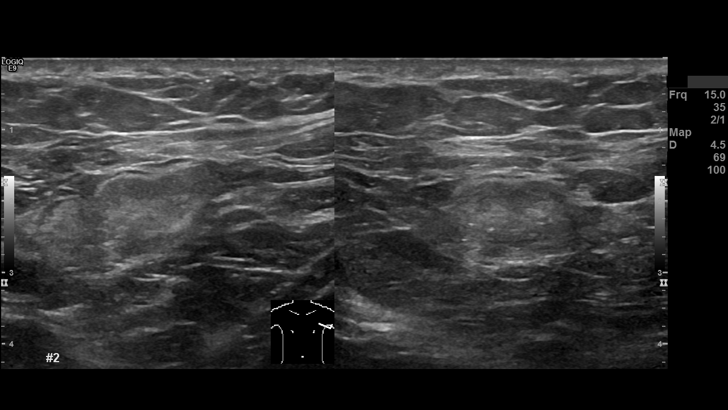
[im 8/11]
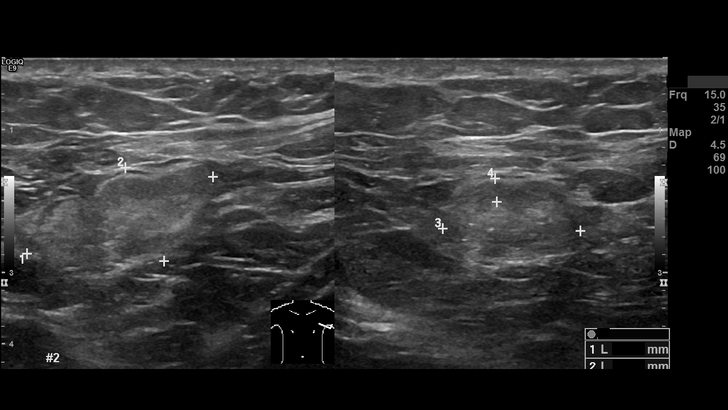
[im 9/11]
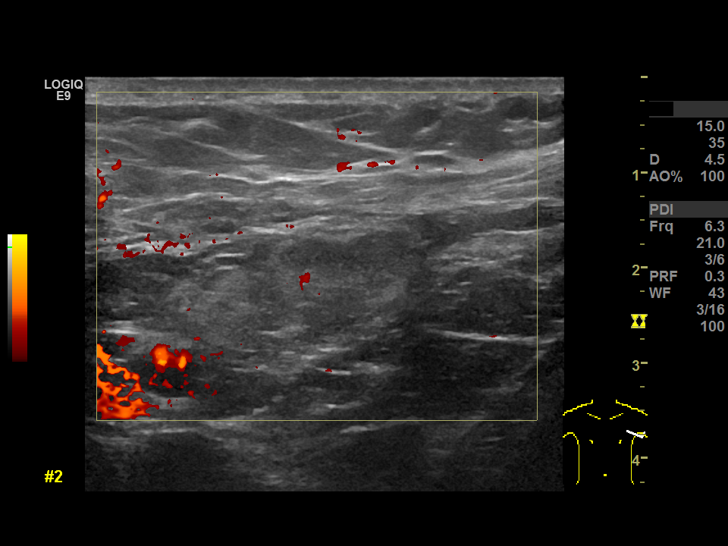
[im 10/11]
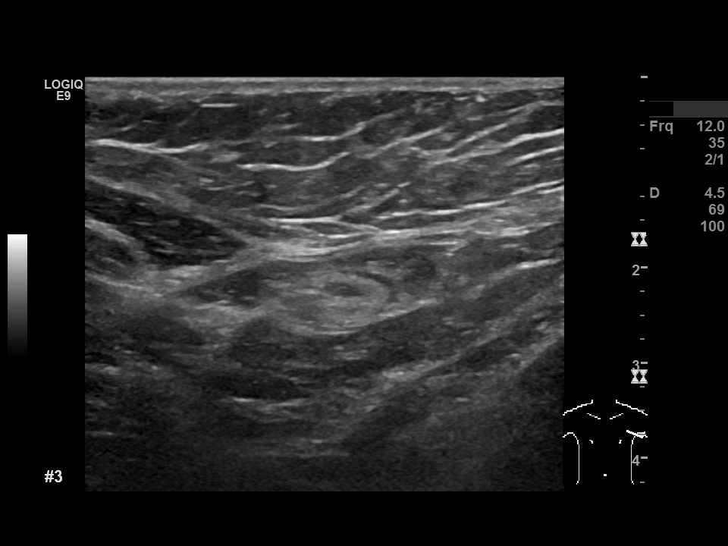
[im 11/11]
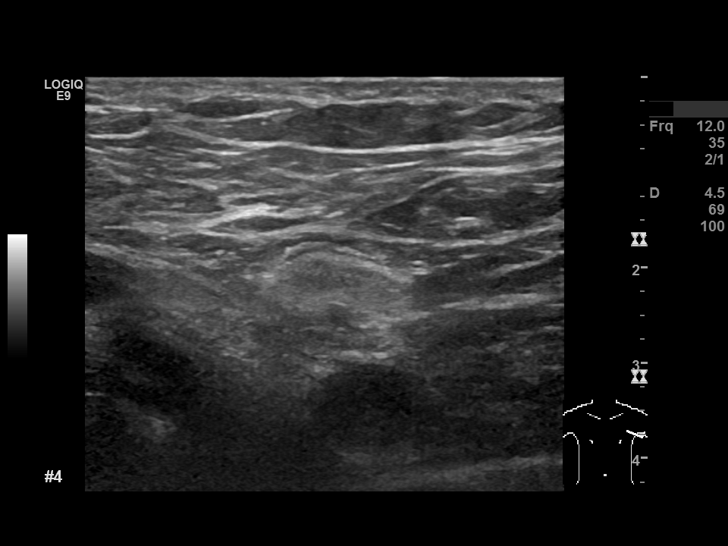

[11 of 11 positions shown; findings below may reference images not displayed]

IMPRESSION: IMPRESSION:
No abnormal lymph nodes were seen in the left axilla.
There is no sonographic evidence of malignancy.
SUMMARY: The patient received a copy of the results at the end of the examination.
Wiwin Fadhila M.D.
mwm/penrad:09/21/2015 [DATE]
Ultrasound BI-RADS: 2 Benign

## 2015-10-26 MED ORDER — VALSARTAN-HYDROCHLOROTHIAZIDE 320-25 MG PO TABS
320-25 MG | ORAL_TABLET | ORAL | 0 refills | Status: DC
Start: 2015-10-26 — End: 2015-11-17

## 2015-10-26 MED ORDER — GLIMEPIRIDE 2 MG PO TABS
2 MG | ORAL_TABLET | ORAL | 0 refills | Status: DC
Start: 2015-10-26 — End: 2015-11-17

## 2015-10-26 NOTE — Telephone Encounter (Signed)
He is due for FOV in August. RF, but please schedule him appt with Dr. Newman PiesNeack. Thanks!

## 2015-10-26 NOTE — Telephone Encounter (Signed)
Last OV 06/04/15  Last Filled Diovan 12/31/14  Glimepiride 06/05/15  Appt None

## 2015-10-26 NOTE — Telephone Encounter (Signed)
[phone] (home) [phone] (work)  Spoke with patient, appt scheduled

## 2015-11-17 ENCOUNTER — Ambulatory Visit: Admit: 2015-11-17 | Discharge: 2015-11-17 | Payer: MEDICARE | Attending: Internal Medicine | Primary: Internal Medicine

## 2015-11-17 DIAGNOSIS — E119 Type 2 diabetes mellitus without complications: Secondary | ICD-10-CM

## 2015-11-17 LAB — LIPID PANEL
Cholesterol, Total: 159 mg/dL (ref 0–199)
HDL: 33 mg/dL — ABNORMAL LOW (ref 40–60)
LDL Calculated: 105 mg/dL — ABNORMAL HIGH (ref <100)
Triglycerides: 106 mg/dL (ref 0–150)
VLDL Cholesterol Calculated: 21 mg/dL

## 2015-11-17 LAB — COMPREHENSIVE METABOLIC PANEL
ALT: 15 U/L (ref 10–40)
AST: 15 U/L (ref 15–37)
Albumin/Globulin Ratio: 2 (ref 1.1–2.2)
Albumin: 4.3 g/dL (ref 3.4–5.0)
Alkaline Phosphatase: 101 U/L (ref 40–129)
Anion Gap: 14 (ref 3–16)
BUN: 21 mg/dL — ABNORMAL HIGH (ref 7–20)
CO2: 26 mmol/L (ref 21–32)
Calcium: 9.4 mg/dL (ref 8.3–10.6)
Chloride: 100 mmol/L (ref 99–110)
Creatinine: 1 mg/dL (ref 0.8–1.3)
GFR African American: >60 (ref >60)
GFR Non-African American: >60 (ref >60)
Globulin: 2.2 g/dL
Glucose: 169 mg/dL — ABNORMAL HIGH (ref 70–99)
Potassium: 4.4 mmol/L (ref 3.5–5.1)
Sodium: 140 mmol/L (ref 136–145)
Total Bilirubin: 0.3 mg/dL (ref 0.0–1.0)
Total Protein: 6.5 g/dL (ref 6.4–8.2)

## 2015-11-17 MED ORDER — GLIMEPIRIDE 2 MG PO TABS
2 | ORAL_TABLET | ORAL | 1 refills | Status: DC
Start: 2015-11-17 — End: 2016-05-25

## 2015-11-17 MED ORDER — VALSARTAN-HYDROCHLOROTHIAZIDE 320-25 MG PO TABS
320-25 | ORAL_TABLET | ORAL | 1 refills | Status: DC
Start: 2015-11-17 — End: 2016-05-25

## 2015-11-17 MED ORDER — GLUCOSE BLOOD VI STRP
ORAL_STRIP | 1 refills | Status: DC
Start: 2015-11-17 — End: 2016-06-02

## 2015-11-17 NOTE — Patient Instructions (Signed)
The goal is to start the day with a fasting glucose between 80 and 130 and to have a   glucose at least under 180 two hours after supper, ideally under 140.  If your glucoses run higher than this frequently, please let me know.     Please bring both your glucose monitor and your glucose records to your next appointment.

## 2015-11-17 NOTE — Progress Notes (Signed)
Subjective:      Patient ID: Carl Blair is a 75 y.o. male.    HPI     Chief Complaint   Patient presents with   ??? Diabetes   ??? Hyperlipidemia   ??? Other      Walking more now    Weight is up, discussed    Glucose - discussed - didn't bring meter/records  Glucose 109 yesterday after exercise  No low spells    No chest pains  No edema    Urination is good now    Review of Systems   Constitutional: Negative for chills, fever and unexpected weight change.   Respiratory: Negative for cough, shortness of breath and wheezing.    Cardiovascular: Negative for chest pain, palpitations and leg swelling.   Gastrointestinal: Negative for abdominal pain, constipation and diarrhea.   Musculoskeletal: Positive for arthralgias. Negative for myalgias.   Hematological: Negative for adenopathy. Does not bruise/bleed easily.       Objective:   Physical Exam   Constitutional: He is oriented to person, place, and time. He appears well-developed and well-nourished.   obese   Eyes: Pupils are equal, round, and reactive to light. No scleral icterus.   Neck: Normal range of motion. No JVD present.   Cardiovascular: Normal rate, regular rhythm and intact distal pulses.    Pulmonary/Chest: Effort normal and breath sounds normal.   Abdominal: Soft. Bowel sounds are normal.   Musculoskeletal: He exhibits no edema.   Filament ok  Vibration not felt in feet   Neurological: He is alert and oriented to person, place, and time.   Psychiatric: He has a normal mood and affect. His behavior is normal. Judgment and thought content normal.       Assessment:        ICD-10-CM ICD-9-CM    1. Type 2 diabetes mellitus without complication, without long-term current use of insulin (HCC) E11.9 250.00 Hemoglobin A1C      Comprehensive Metabolic Panel   2. Acquired hypothyroidism E03.9 244.9 TSH without Reflex      T4, Free   3. Essential hypertension I10 401.9 Comprehensive Metabolic Panel   4. Pure hypercholesterolemia E78.00 272.0 Comprehensive Metabolic  Panel      Lipid Panel   5. Morbid obesity due to excess calories (HCC) E66.01 278.01    6. Prostate cancer screening Z12.5 V76.44 Psa screening   7. 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   check labs    Thyroid - check lab    htn - The current medical regimen is effective;  continue present plan and medications.     Cholesterol - check lab, cont med    Obesity - discussed - will work on this    Urination - ok    Vibration - ? 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

## 2015-11-18 LAB — HEMOGLOBIN A1C
Hemoglobin A1C: 7.8 %
eAG: 177.2 mg/dL

## 2015-11-18 LAB — T4, FREE: T4 Free: 1.5 ng/dL (ref 0.9–1.8)

## 2015-11-18 LAB — VITAMIN B12 & FOLATE
Folate: 9.17 ng/mL (ref 4.78–24.20)
Vitamin B-12: 312 pg/mL (ref 211–911)

## 2015-11-18 LAB — PSA SCREENING: PSA: 0.84 ng/mL (ref 0.00–4.00)

## 2015-11-18 LAB — TSH: TSH: 1.76 u[IU]/mL (ref 0.27–4.20)

## 2015-12-18 MED ORDER — ATORVASTATIN CALCIUM 10 MG PO TABS
10 MG | ORAL_TABLET | ORAL | 0 refills | Status: DC
Start: 2015-12-18 — End: 2016-05-25

## 2015-12-18 MED ORDER — LEVOTHYROXINE SODIUM 100 MCG PO TABS
100 MCG | ORAL_TABLET | ORAL | 0 refills | Status: DC
Start: 2015-12-18 — End: 2016-05-25

## 2015-12-18 MED ORDER — CARVEDILOL 25 MG PO TABS
25 MG | ORAL_TABLET | ORAL | 0 refills | Status: DC
Start: 2015-12-18 — End: 2016-05-25

## 2015-12-18 MED ORDER — METFORMIN HCL 1000 MG PO TABS
1000 MG | ORAL_TABLET | ORAL | 0 refills | Status: DC
Start: 2015-12-18 — End: 2016-05-25

## 2015-12-18 MED ORDER — OMEPRAZOLE 40 MG PO CPDR
40 MG | ORAL_CAPSULE | ORAL | 0 refills | Status: DC
Start: 2015-12-18 — End: 2016-05-25

## 2015-12-18 NOTE — Telephone Encounter (Signed)
Last OV 11/17/15  Last Filled 07/14/15  Appt None

## 2016-02-17 ENCOUNTER — Inpatient Hospital Stay: Admit: 2016-02-17 | Payer: MEDICARE | Attending: Cardiovascular Disease

## 2016-02-17 DIAGNOSIS — I340 Nonrheumatic mitral (valve) insufficiency: Secondary | ICD-10-CM

## 2016-02-17 LAB — ECHOCARDIOGRAM 2D WO COLOR DOPPLER COMPLETE: Left Ventricular Ejection Fraction: 63

## 2016-02-22 ENCOUNTER — Ambulatory Visit: Admit: 2016-02-22 | Discharge: 2016-02-22 | Payer: MEDICARE | Attending: Family

## 2016-02-22 DIAGNOSIS — G4733 Obstructive sleep apnea (adult) (pediatric): Secondary | ICD-10-CM

## 2016-02-22 NOTE — Telephone Encounter (Signed)
Message sent to [facility] Service for PAP renewal script and PAP pressure change done in the office. Orders written 02/22/16.

## 2016-02-22 NOTE — Unmapped (Signed)
~~~~~~~~~~~~~~~~~~~~~~~~~~~~~~~~~~~~~~~~~~~~~~~~~~~  Referring Provider: Dr Newman Pies    Chief Complaint: Follow up OSA on CPAP yearly follow up    Date of NPSG: 05/27/2003   SE %: 63.5   AHI: 52   Lowest p ox %: 73   NPSG performed at: [facility]     Date of CPAP: 12/06/2008   SE %: 76.5   AHI: 1.1   Lowest p ox %: 90.0   CPAP performed at: [facility] @ time of study: 265 lbs  DME Company: [employer] account [employer] type: full face  cmH2O: 13       HPI: Richard Hetz Johnsonis a 75 y.o. male with OSA on CPAP is doing well. No snoring, or no excessive sleepiness. He has no drowsy driving.  He is getting his supplies and not having any problems with equipment or cleaning. No rain out. Likes his mask and pressure     No other changes in his medical, surgical, social or family history or any hospitalizations or changes in medications since the last visit    Previous UPPP, oral venting and likes his FF mask       HTN, DM, atrial fib - corrected with mini-maze procedure) GERD, dylipidemia      DEVICE/ MASK/ DME INFORMATION  DME: medical services  Mask type: Airfit F10 large FF mask    Compliance Data Download: 01/23/2015-02/22/2015  Device Model: RESMED S10  Mask: airfit F10 med  PAP Pressure: min 10 max 17 cm H2O average pressure 16.6mg   Percent Days with Device Usage: 97 %  Percent of Days with usage >= 4 hours: 97 %  Average Usage (number of hours used all days): 7 hrs 56 min  AHI: 7.4  Leak 19average      EPWORTH SLEEPINESS SCALE: (at or above 10 is abnormal and 24 is the maximum score)  Epworth Sleepiness Scale 04/14/2011 02/22/2016   Sitting and reading 2 1   Watching TV 3 2   Sitting, inactive in a public place (e.g. a theatre or a meeting) 1 0   As a passenger in a car for an hour without a break 2 0   Lying down to rest in the afternoon when circumstances permit 3 2   Sitting and talking to someone 0 0   Sitting quietly after a lunch without alcohol 1 1   In a car, while stopped for a few minutes in traffic 0 0   Total score 12 6        Sleep Schedule  Bedtime is 12a. Falls asleep within 10 minutes, with 10 nighttime awakenings for bathroom. Falls back asleep in 10 minutes. Sleeps on own bed. Awakens at 7a during the week and 8a on the weekends.   Patient thinks they get approximately 6 hours of sleep a night.      Patient does takes naps 3 per week for 35 min    Past Medical Hx:   Past Medical History:   Diagnosis Date   ??? Atrial fibrillation (HCC)     corrected with Mini-maze procedure   ??? Cataract     left eye   ??? Diabetes mellitus (HCC)     type 2   ??? Dilated cardiomyopathy (HCC)    ??? Hearing loss     left ear   ??? Hypercholesteremia    ??? Hypertension    ??? Sleep apnea    ??? Thyroid disease     hypothyroidism     SurgHx:   Past Surgical  History:   Procedure Laterality Date   ??? COLONOSCOPY W/ POLYPECTOMY      benign  has at 3 plus procedures   ??? HERNIA REPAIR  9/09    right inguinal, mini maze   ??? HERNIA REPAIR  2/10    herniated lungs bilateral (repaired with mesh)   ??? mini-maze      corrected his A-fib   ??? SEPTOPLASTY     ??? UVULOPALATOPHARYNGOPLASTY     ??? UVULOPALATOPHARYNGOPLASTY      has had esophageal dilations in the past (Dr.Decter)   ??? WISDOM TOOTH EXTRACTION       Problem List:  Patient Active Problem List   Diagnosis   ??? Other, multiple, and unspecified sites, insect bite, nonvenomous, without mention of infection(919.4)   ??? Hemangioma of intra-abdominal structures   ??? Other specified disorders of liver   ??? Malignant neoplasm of head of pancreas (HCC)   ??? Disorder of esophagus   ??? Chest pain   ??? Contact dermatitis and other eczema, due to unspecified cause   ??? Morbid obesity (HCC)   ??? Obstructive sleep apnea   ??? Hypothyroidism   ??? Type II or unspecified type diabetes mellitus without mention of complication, not stated as uncontrolled   ??? Other and unspecified hyperlipidemia   ??? Essential hypertension   ??? Atrial fibrillation (HCC)   ??? Congestive heart failure (HCC)   ??? Hemorrhoids   ??? Esophageal reflux   ??? Pain in limb   ??? Special  screening for malignant neoplasm of prostate   ??? Multiple rib fractures   ??? OSA (obstructive sleep apnea)     CURRENT MEDICATIONS:   Current Outpatient Prescriptions   Medication Sig   ??? acetaminophen Take 2 tablets (1,000 mg total) by mouth 3 times a day.   ??? aspirin Take 81 mg by mouth daily.   ??? atorvastatin Take 10 mg by mouth at bedtime.    ??? carvedilol Take 25 mg by mouth 2 (two) times daily.     ??? glimepiride Take 1 tablet by mouth  every morning before  breakfast   ??? GLUC/CHON-MSM#1/C/MANG/BOS/BOR (OSTEO BI-FLEX ORAL) Take by mouth. OSTEO BI-FLEX JOINT SHIELD TABS    ??? glyBURIDE-metformin Take 2 tablets by mouth 2 times a day.    ??? levothyroxine Take 100 mcg by mouth every morning.    ??? omeprazole Take 40 mg by mouth every morning.    ??? polyethylene glycol Take 17 g by mouth 2 times a day as needed (for constipation).   ??? SAW PALMETTO XTR/ZINC PICOLIN (SAW PALMETTO EXTRACT ORAL) Take 2 tablets by mouth 2 times a day.    ??? senna-docusate Take 1 tablet by mouth at bedtime.   ??? valsartan-hydrochlorothiazide Take 1 tablet by mouth every morning.      No current facility-administered medications for this visit.       Allergies  Allergies as of 02/22/2016   ??? (No Known Allergies)     Family Hx:   Family History   Problem Relation Age of Onset   ??? Diabetes Mother      type 2   ??? Hypertension Mother    ??? Diabetes Sister      type 2   ??? Coronary artery disease Sister    ??? Coronary artery disease Brother    ??? Hypertension Brother    ??? Diabetes Brother      type 2   ??? Coronary artery disease Sister    ??? Hypertension Brother  Social Hx:   Patient  reports that he has never smoked. He has never used smokeless tobacco. He reports that he does not drink alcohol or use drugs.  Please see attached form for further details.     Review of Systems  General ROS: all negative  Ophthalmic ROS: negative  Ears, nose, mouth, throat, and face ROS: negative  Respiratory ROS: all negative  Cardiovascular ROS:  negative  Gastrointestinal ROS: negative  Genitourinary ZOX:[id number]  Integument/breast ROS: negative  Hematologic/lymphatic ROS: negative  Musculoskeletal JWJ:[id number]  Neurological ROS: negative  Psychological ROS: all negative  Endocrine ROS: all  negative  Allergy and Immunology ROS: negative    Denies: suicidal ideas, irritability, depression, racing mind, changes in appetite, fainting spells, alcoholism, tremors, using illegal drugs, worrying about inability to sleep, insomnia, chest pain and shortness of breath,    I have reviewed the review of system and advised patient to contact primary care physician or specialist for non sleep related issues.    Previous Report(s) Reviewed: historical medical records, lab reports, office notes      The following portions of the patient's history were reviewed and updated as appropriate: allergies, current medications, past family history, past medical history, past social history, past surgical history and problem list.    OBJECTIVE :   BP 134/62    Pulse 84    Ht 6' (1.829 m)    Wt (!) 263 lb (119.3 kg)    SpO2 96%    BMI 35.67 kg/m??    Wt Readings from Last 3 Encounters:   02/22/16 (!) 263 lb (119.3 kg)   05/12/15 (!) 260 lb (117.9 kg)   03/20/15 (!) 260 lb (117.9 kg)       Physical Exam   Constitutional: alert and in no apparent distress. Well-developed and well-nourished.   Head: normocephalic and atraumatic.   Ears: appear normal externally bilaterally  Eyes: Conjunctivae are normal. Pupils are equal, round, no scleral icterus, drainage or redness.   Nose: Columella normal. Positive for nasal congestion. No deviated septum.   Mouth : Mallampati 4. Crowded airway. Oropharynx is clear, moist and pink. UPPP,  Macroglossia w/ ridging. Dentition poor missing teeth.  Neck:  grossly normal range of motion.  Trachea midline.    Cardiovascular: normal rate and regular rhythm. Neg murmur. No peripheral edema   Pulmonary/Chest: effort normal and breath sounds normal. No  stridor. No respiratory distress, no wheezes, no rales.   Abdominal: deferred   Musculoskeletal: Grossly normal range of motion and strength. Normal gait.   Neurological:  alert. Cranial nerves II-XII grossly intact. Strength 5/5 in upper extremities bilaterally. Normal gait.  No tremor.   Skin: skin is warm and dry. No rash noted. Multiple healing skin lesions on arms and face  Psychiatric: normal speech, normal mood and affect, behavior is normal. Judgment and thought content normal.     Labs Reviewed:   No results found for: IRON  Lab Results   Component Value Date    WBC 8.7 02/07/2013    HGB 13.0 (L) 02/07/2013    HCT 38.7 02/07/2013    MCV 84.7 02/07/2013    PLT 219 02/07/2013     Lab Results   Component Value Date    GLUCOSE 146 (H) 05/12/2015    BUN 22 05/12/2015    CO2 29 05/12/2015    CREATININE 1.12 05/12/2015    K 4.2 05/12/2015    NA 136 05/12/2015    CL 99 05/12/2015    CALCIUM  9.5 05/12/2015     Lab Results   Component Value Date    HGBA1C 7.2 (H) 04/15/2008     Lab Results   Component Value Date    TSH 1.50 04/15/2008    FREET4 1.6 04/15/2008     Transthoracic Echocardiography    Patient: ?? ??Lupe, Bonner  MR #: ?? ?? ?? [id number]  Account:  Study Date: 02/17/2016  Gender: ?? ?? M  Age: ?? ?? ?? ??75  DOB: ?? ?? ?? ??[birth year]  Room: ?? ?? ?? [facility]    ??ORDERING ?? ??Eda Keys, MD  ??OPERATOR ?? ??Mardee Postin RVT  ??PERFORMING ??[facility], [place]    --------------------------------------------------------------------    Procedure:ECHO 2D ?? ?? ?? ??Order:ECHO 2D ?? ?? ?? ??Accession  COMPLETE (TTE) ?? ?? ?? ?? ?? COMPLETE (TTE) ?? ?? ?? Number:[id number]    --------------------------------------------------------------------  Indications: ?? ?? ??Atrial fibrillation - paroxysmal 427.31. ??PACs  427.61.    --------------------------------------------------------------------  PMH: ?? Atrial fibrillation. ??Risk factors: ??Hypertension. Diabetes  mellitus.  Dyslipidemia.    --------------------------------------------------------------------  Study data: ?? Study status: ??Routine. ??Procedure: ??Transthoracic  echocardiography. Image quality was technically difficult. Scanning  was performed from the parasternal, apical, and subcostal acoustic  windows. ?? ?? ?? ?? ??Transthoracic echocardiography. ??M-mode, complete  2D, complete spectral Doppler, and color Doppler. ??Birthdate:  Patient birthdate: 1940/09/01. ??Age: ??Patient is 75yr old. ??Sex:  Gender: male. ??Height: ??Height: 72in. Height: 72in. ??Weight:  Weight: 259.5lb. Weight: 259.4lb. ??Body mass index: ??BMI:  35.3kg/m^2. ??Body surface area: ?? ??BSA: 2.64m^2. ??Blood pressure:  ??151/91 ??Patient status: ??Outpatient. ??Study date: ??Study date:  02/17/2016. Study time: 09:48 AM. ??Location: ??Echo laboratory.  ??  --------------------------------------------------------------------  Study Conclusions    - Left ventricle: The cavity size was normal. Wall thickness was  ????normal. Systolic function was normal. The estimated ejection  ????fraction was in the range of 60% to 65%. Wall motion was normal;  ????there were no regional wall motion abnormalities.  - Mitral valve: Mild regurgitation.  - Left atrium: The atrium was mildly dilated.  - Right ventricle: Systolic function was normal.  ASSESSMENT  Richard Lewis Johnsonis a 75 y.o. male   OSA on CPAP and is compliant   BMI 35    HTN, DM, atrial fib - corrected with mini-maze procedure) GERD, dylipidemia  UPPP- oral venting and now using FF mask    PLAN:   1. OSA on CPAP  Reviewed download   increased pressure  min 10 max 18 cmH2O script written  yearly script written  He will call in  he will call in  2 Weeks so I can run a download. If AHI is elevated may order CPAP titration study   DME : Medical services    Reviewed importance of using PAP to control OSA at all times when sleeping such as when travelling and taking naps.     Patient was educated regarding OSAHS pathophysiology, relationship  with cardiovascular, cerebrovascular and metabolic morbidity and mortality, issues related to PAP adherence, its use, cleaning and maintenance.  if any issues were to arise before their next office visit they may follow up sooner or contact their DME company for assistance.  Patient has been advised that anything preventing them from using the device on a daily basis is a concern that should be relayed to [place] their sleep provider. Patient verbalized understanding and agrees to comply.     Exercise and weight loss encouraged    The patient was educated on the dangers of  drowsy driving; advised to avoid long distance or night driving. Instructed to take a short nap on the side of the road if sleepy when driving. Tips to avoid drowsy driving were provided to patient.     Sleep hygiene techniques were provided to patient.     Return to clinic in 1 year if doing well- he will call in  2 Weeks so I can run a download    SLEEP QUALITY MEASURES:   Obstructive Sleep Apnea Follow Up Visit:   Adherence to therapy documented at least annually?: Yes    Reassessment of excessive daytime sleepiness?: Yes    Weight / body mass index assessment at visit?: Yes    If body mass index greater than 25 was discussion of weight management discussed? : Yes    Blood pressure assessed at visit? : Yes          Medical Decision Making:   The following items were considered in medical decision making:   Review / order other diagnostic tests/interventions   Risks & Benefits:   Risks, benefits and treatment options discussed with patient.

## 2016-02-22 NOTE — Unmapped (Addendum)
Key points to remember  ?? You should wear the device every time you sleep, no matter when you sleep or where you sleep. When you nap or travel or go on vacation you need to use the machine when asleep.   ?? Bring your device, mask, hose, power cord etc to every office visit.   ?? If you sleep without the device your Obstructive Sleep Apnea (OSA) is untreated and your risk of heart attacks and strokes are higher.   ?? Clean the device on a regularly basis to avoid sinus infections and pneumonias   ?? Never travel with water in the reservoir   ?? Replace supplies on a regularly basis (see schedule below). You may need to contact your Durable Medical Equipment company (DME) and request supplies (just like you would medications from a pharmacy).  ?? Alcohol will worsen OSA, Alcohol can decrease your drive to breathe, slowing your breathing and making your breaths shallow. In addition, it may relax the muscles of your throat, which may make it more likely for your upper airway to collapse. REMEMBER TO ALWAYS WEAR YOUR PAP DEVICE WHEN SLEEPING.   ?? Certain medications can also worsen OSA such as some pain medications, anti-anxiety medications, muscle relaxants, testosterone supplementation. Please inform your sleep provider which medications you take on a regular basis.   ?? All patients are advised to make follow up appointments with the provider after they are set up with their PAP device. Most insurance plans now require the patient set up a follow up appointment with the provider 31-90 days after they have been set up with their device and at least once a year thereafter. Discuss these issues with your provider for more details.   ?? Please note when you are provided with a mask you have a manufacturer 30 day guarantee that comes with it. If you do not like that mask for any reason contact your DME and request an exchange. After 30 days you may not be eligible for a new mask for another 3-6 months depending on insurance. Talk  to your DME or sleep provider for more details.   ?? If you are struggling with nasal congestion try to use a nasal sinus rinse available over the counter such as Netti Pot 1 hour before bedtime (you may have to use it daily for a 7-10 days then as needed). You can also try over the counter Flonase or Nasacort 1-2 sprays in each nostril at bedtime.   ?? If you ever have problems with the device or the plan discussed with your sleep provider please contact your provider by phone or using MyChart. Thank you      CLEANING INSTRUCTIONS FOR PAP EQUIPMENT     Keeping your equipment and supplies clean is very important.     REMINDER: Only use DISTILLED WATER in your humidifier, Empty water daily.    DAILY  Mask and tubing:   ?? Wash your face before applying mask  ?? Wash mask and tubing with baby shampoo and warm water.     Humidifier:   ?? Empty water in reservoir  ?? Clean with baby shampoo and warm water  ?? Rinse, then air dry    WEEKLY  Mask and tubing:   ?? Soak your mask and tubing in 1 part vinegar and 3 parts water for 30 minutes. Rinse, and allow to air dry.     Humidifier:   ?? Wash with warm water and baby shampoo  ?? Soak in   1 part vinegar and 3 parts water for 30 minutes  ?? Rinse with warm water and allow to air dry    Machine Exterior:   ?? Wipe with a clean damp cloth    MONTHLY AND/OR AS NEEDED  ?? Reusable foam filters (black filter)- wash in warm water with baby shampoo. Rinse well and dry with paper towel  ?? Disposable felt filter (white filter)- Replace filter every two weeks to once a month    NOTE: If you are having repeated sinus and /or respiratory infections, dirty equipment may be the cause. It may help to clean and disinfect your equipment more frequently    TRAVELING  Always make sure the humidifier is empty when traveling. (including doctor appointments, air travel or long distance driving).  When flying, always carry your PAP device with you as a carry on item, NEVER check it in as baggage. We can  provide you with a letter stating it is a medical device, talk to your provider.    Always make sure you have your mask and tubing with you. You will need appropriate plug adapters when traveling outside the [place].    If travelling by air and you are unable to carry distilled water with you use bottled water (no more than 2 weeks). DO NOT USE TAP WATER.       Equipment Replacement Schedule    To get the most benefit from your PAP therapy, your equipment should be replaced when necessary based on wear and tear. For example, your mask may need to be replaced if you notice it is cracked or the seal is leaking. If your tubing is torn, it needs to be replaced.   If your equipment is showing signs of wear, you may be entitled to replace it. The replacement schedule for Medicare patients is shown below. If you are NOT a Medicare patient, please check with your DME (Durable Medical Supply) provider for your individual insurance policy???s replacement schedule.     Supplies   Medicare Medicaid My Insurance Plan  Mask    1 per 3 m 1 per year _______________  Nasal cushion  2 per m 1 per 6 m _______________  Pillows cushion  2 per m 1per 6 m _______________  Full-face cushion  1 per m 1per 6 m _______________  Headgear   1 per 6 m 1 per year _______________  Water Chamber  1 per 6 m N/A  _______________  Chinstrap   1 per 6 m 1 per 6 m       _______________  Tubing/Hose   1 per 3 m 1 per year _______________  Heater wire tubing  1 per 3 m N/A  _______________  Filter, disposable (white) 2 per m 1 per m _______________  Filter, particle foam (black) 1 per 6 m 4 per year _______________  Therapy device  1 per 5 yrs  ?  _______________    Follow up and compliance goals for Medicare and Medicaid patients   (most private insurances are now following similar rules)  Medicare and Medicaid require patients to follow-up with their provider after they are set up with their PAP equipment. The follow up appointment should be within  31-90 days after receiving their equipment. If patient does not follow up as directed there might be issues with the cost of the device being covered by the insurance. Therefore please ensure you make a follow up appointment with your sleep provider.      Insurance   also requires that the patient meet some compliance goals such as the download should indicate patient has been wearing the device for at least 4 hours per night AND they are using the device for 30 nights in a row.      HUMIDIFICATION feature on your PAP device. Most machines are pre-set for a humidity setting of 3 or 4. You can manually change the humidifier settings on your machine when you turn the machine on. Increase the humidity level one increment at a time.  Respironics device: Turn the on/off button to the left for less humidification or the right for more humidification. Humidification ranges from 1-5. If you have a heated hose talk to your provider or DME who can teach you how to make adjustments.     F&P device: Turn the large dial to the right for higher level and left for lower level. Humidification ranges from 1-7.     ResMed S9 device: Attach the humidifier component to the main unit, turn   the dial and choose the water droplet icon; press the button once. Then turn the dial to adjust your humidity setting. Press button a final time to save the setting. Humidification ranges from 1-8        Resmed S10 Device - This device has an Auto humidification feature.    If you have a heated hose it defaults to Auto Setting. You can change it to           manual setting under My Options. Switch Climate Control from Auto to            Manual. Then you will see two other options pop up. Tube Temperature and Humidity Level. Tube temperature is like your thermostat how hot or cold you want the air to be. It can range from 60-86 degree (most defaults to 81 degrees). Humidity Level is the next option this is the Moisture that is captured from the  distilled water placed in the chamber. It goes from 1-8. Higher the # more moister. SEE THIS VIDEO https://www.youtube.com/watch?v=RzCskdfN46k&sns=em         For example, if you are waking up with a dry mouth, you should turn your humidifier up one number at a time. If you are getting excess condensation (water droplets) in your mask or tubing, you should turn the humidifier down one number at a time. In summer you need less humidity in winter you need more humidity. THIS NUMBER RARELY STAYS THE SAME ALL YEAR LONG. YOU HAVE TO CHANGE IT DEPENDING ON HOW YOU FEEL AND WHAT THE WEATHER IS OUTSIDE.     A prescription will be sent to a DME (medical supply company) covered by your insurance. The DME Company should contact you within 7-10 business days once they receive the prescription. If you have not received a call from the DME Company regarding your device, PLEASE CALL US so we may investigate further.   DROWSY DRIVING TIPS    These suggestions will help prevent you from the risk of drowsy driving.     1. If you feel tired or drowsy don't drive. Sleepiness is a major cause of motor vehicle accidents and accounts for 40% of all fatal crashes reported on the [place] thru[place]. No matter how much you think you can control sleepiness, you can't.     2. Ensure you follow your doctor's advice about the treatment for your sleep disorder. For example, if you have sleep apnea and use CPAP, ensure you use it   fully the night before your trip.     3. Get a good night's sleep before driving. Do not cut yourself short of sleep if you plan a long drive the next day. Get to bed early and do not stay up late packing.     4. Avoid alcohol both the night before your trip and during your trip. Alcohol will disrupt sleep and make you more tired the next day. Sleepiness and alcohol are additive in increasing impairment of your driving ability.     5. Avoid any sedative medications, including sedative antihistamines that are often contained in  cold or allergy medications, the night before you drive as they may have long lasting effects the next day.     6. Travel during non-sleeping hours. Accidents due to sleepiness are more common during the nighttime hours.     7. If sleepy, stop and rest. Drink coffee and walk around. Take a brief nap, lock your car doors and take a nap in your car if you are sleepy. Have a 10-15 minute break after every 2 hours of driving.     8. Drive with a companion. Share the driving. Relax in the back seat until it is your time to share the driving again.     These are only guidelines; please discuss your diagnosis and treatment with your sleep medicine provider. Drive safely.       Sleep Hygiene     Sleep disruption is common, especially during times when you may feel emotionally overwhelmed. Anxieties, relentless replay of the day???s events and heightened emotions may significantly interfere with your sleep. Lack of sleep robs you of needed rest, making management of your illness more difficult.   Bring sleep patterns under control and working at a consistent, stable pattern is very important to illness management. You need your rest. The most common cause of insomnia is a change in your daily routine. For example traveling, change in work hours, disruption of other behaviors (eating, exercise, leisure, etc.), relationship conflicts may cause sleep problems. Paying attention to good sleep hygiene is the most important thing you can do to maintain good sleep.     Do:   1. Go to bed at the same time each day.   2. Get up from bed at the same time each day.   3. Get regular exercise each day, preferably in the morning. There is good evidence that regular exercise improves restful sleep. This includes stretching and aerobic exercise.   4. Get regular exposure to outdoor or bright lights, especially in the late afternoon.   5. Keep the temperature in your bedroom comfortable.   6. Keep the bedroom quiet when sleeping   7. Keep the  bedroom dark enough to facilitate sleep.   8. Use your bed only for sleep and sex.   9. Take medications as directed. It???s often helpful to take prescribed sleeping pills one hour before bedtime, so they are causing drowsiness when you lie down, or 10 hours before getting up to avoid daytime drowsiness.   10. Use a relaxation exercise just before going to sleep, (muscle relaxation, imagery, massage, breathing exercises, warm baths).   11. Keep your feet and hands warm. Wear warm socks and/or mittens or gloves to bed.     Don???t:   1. Exercise just before going to bed.   2. Engage in stimulating activity just before bed, such as playing a competitive game, watching an exciting program on TV or movie, or having   an important discussion with a loved one.   3. Have caffeine in the evening (coffee, many teas, chocolate, sodas, etc). No caffeine after noon.    4. Read or watch TV in bed.   5. Use alcohol to help you sleep.   6. Go to bed too hungry or too full.   7. Take another person???s sleeping pills.   8. Take over-the-counter sleeping pills without your doctor???s knowledge. Tolerance can develop rapidly with these medications. Diphenhydramine (an ingredient commonly found in over-the-counter sleep medications) can have serious side effects for elderly patients.   9. Take daytime naps.   10. Command yourself to go to sleep. This only makes your mind and body more alert.   11. Do not look at the time when you wake up in the middle of the night. Remove the clocks from the bedroom or set your alarm for the morning and have it face the wall.   If you lie in bed awake for more than 20-30 minutes, get up, go to a different room (or different part of the bedroom), and participate in a quiet activity (non-excitable reading or listen to soothing music) then return to bed when you feel sleepy. Do this as many times during the night as needed.    CALL in 2-4 weeks to get a download

## 2016-03-08 LAB — BASIC METABOLIC PANEL
Anion Gap: 9 mmol/L (ref 7–16)
BUN: 26 mg/dL — AB (ref 7–18)
CO2: 24 mmol/L (ref 21–32)
Calcium: 8.5 mg/dL (ref 8.5–10.1)
Chloride: 100 mmol/L (ref 98–107)
Creatinine + eGFR Panel: 1.2 mg/dL (ref 0.60–1.3)
GFR African American: >60 mL/min/{1.73_m2} (ref >60)
GFR Non-African American: 59 mL/min/{1.73_m2} (ref >60)
Glucose: 274 mg/dL — AB (ref 74–106)
Potassium: 4.2 mmol/L (ref 3.5–5.1)
Sodium: 133 mmol/L — AB (ref 136–145)

## 2016-05-25 ENCOUNTER — Ambulatory Visit: Admit: 2016-05-25 | Discharge: 2016-05-25 | Payer: MEDICARE | Attending: Internal Medicine | Primary: Internal Medicine

## 2016-05-25 DIAGNOSIS — E119 Type 2 diabetes mellitus without complications: Secondary | ICD-10-CM

## 2016-05-25 LAB — LIPID PANEL
Cholesterol, Total: 182 mg/dL (ref 0–199)
HDL: 26 mg/dL — ABNORMAL LOW (ref 40–60)
LDL Calculated: 113 mg/dL — ABNORMAL HIGH (ref <100)
Triglycerides: 217 mg/dL — ABNORMAL HIGH (ref 0–150)
VLDL Cholesterol Calculated: 43 mg/dL

## 2016-05-25 LAB — COMPREHENSIVE METABOLIC PANEL
ALT: 18 U/L (ref 10–40)
AST: 14 U/L — ABNORMAL LOW (ref 15–37)
Albumin/Globulin Ratio: 1.9 (ref 1.1–2.2)
Albumin: 4.3 g/dL (ref 3.4–5.0)
Alkaline Phosphatase: 98 U/L (ref 40–129)
Anion Gap: 14 (ref 3–16)
BUN: 26 mg/dL — ABNORMAL HIGH (ref 7–20)
CO2: 28 mmol/L (ref 21–32)
Calcium: 9.8 mg/dL (ref 8.3–10.6)
Chloride: 92 mmol/L — ABNORMAL LOW (ref 99–110)
Creatinine: 1.2 mg/dL (ref 0.8–1.3)
GFR African American: >60 (ref >60)
GFR Non-African American: 59 — AB (ref >60)
Globulin: 2.3 g/dL
Glucose: 284 mg/dL — ABNORMAL HIGH (ref 70–99)
Potassium: 5.1 mmol/L (ref 3.5–5.1)
Sodium: 134 mmol/L — ABNORMAL LOW (ref 136–145)
Total Bilirubin: 0.5 mg/dL (ref 0.0–1.0)
Total Protein: 6.6 g/dL (ref 6.4–8.2)

## 2016-05-25 MED ORDER — CARVEDILOL 25 MG PO TABS
25 | ORAL_TABLET | ORAL | 1 refills | Status: DC
Start: 2016-05-25 — End: 2016-10-20

## 2016-05-25 MED ORDER — LEVOTHYROXINE SODIUM 100 MCG PO TABS
100 | ORAL_TABLET | ORAL | 1 refills | Status: DC
Start: 2016-05-25 — End: 2016-10-20

## 2016-05-25 MED ORDER — VALSARTAN-HYDROCHLOROTHIAZIDE 320-25 MG PO TABS
320-25 | ORAL_TABLET | Freq: Every day | ORAL | 1 refills | Status: DC
Start: 2016-05-25 — End: 2016-10-07

## 2016-05-25 MED ORDER — OMEPRAZOLE 40 MG PO CPDR
40 | ORAL_CAPSULE | ORAL | 1 refills | Status: DC
Start: 2016-05-25 — End: 2016-10-20

## 2016-05-25 MED ORDER — GLIMEPIRIDE 2 MG PO TABS
2 | ORAL_TABLET | ORAL | 1 refills | Status: DC
Start: 2016-05-25 — End: 2016-10-20

## 2016-05-25 MED ORDER — ATORVASTATIN CALCIUM 10 MG PO TABS
10 | ORAL_TABLET | ORAL | 1 refills | Status: DC
Start: 2016-05-25 — End: 2016-10-20

## 2016-05-25 MED ORDER — METFORMIN HCL 1000 MG PO TABS
1000 | ORAL_TABLET | ORAL | 1 refills | Status: DC
Start: 2016-05-25 — End: 2016-10-20

## 2016-05-25 NOTE — Progress Notes (Signed)
Subjective:      Patient ID: Carl Blair is a 76 y.o. male.    HPI     Chief Complaint   Patient presents with   ??? Shortness of Breath   ??? Diabetes   ??? Hypertension   ??? Hyperlipidemia      Dyspnea - yesterday had a spell that lasted two hours, resolved, back to baseline  Only has dyspnea when markedly exerts  Walking - no problem  No fever/chills  No chest pain  No edema    Fasting for blood work    Glucoses "high" at home, didn't bring meter/records    bp - not monitoring at home, machine broken    Weight is steady   Diet has been 'off' - discussed  Has done WW in past - recommend return  Walking helps    Echo from 02-2016 reviewed with him    Review of Systems   Constitutional: Negative for chills, fever and unexpected weight change.   Respiratory: Positive for shortness of breath. Negative for wheezing.         Has been ill around [place], cough is gone, took a while     Cardiovascular: Negative for chest pain, palpitations and leg swelling.   Gastrointestinal: Negative for abdominal pain, constipation and diarrhea.   Musculoskeletal: Positive for arthralgias. Negative for myalgias.   Neurological: Negative for syncope and light-headedness.   Hematological: Negative for adenopathy. Does not bruise/bleed easily.   Psychiatric/Behavioral: Negative for dysphoric mood. The patient is not nervous/anxious.        Objective:   Physical Exam   Constitutional: He is oriented to person, place, and time. He appears well-developed and well-nourished.   obese   Eyes: Pupils are equal, round, and reactive to light. No scleral icterus.   Cardiovascular: Normal rate, normal heart sounds and intact distal pulses.    irr irr   Pulmonary/Chest: Effort normal and breath sounds normal.   Abdominal: Soft. Bowel sounds are normal.   Musculoskeletal: He exhibits no edema.   Feet -- no sores seen  Normal pulses  Filament test normal bilaterally     Neurological: He is alert and oriented to person, place, and time.   Psychiatric:  He has a normal mood and affect. His behavior is normal. Judgment and thought content normal.       Assessment:        ICD-10-CM ICD-9-CM    1. Type 2 diabetes mellitus without complication, without long-term current use of insulin (HCC) E11.9 250.00 HM DIABETES FOOT EXAM      Hemoglobin A1C   2. Acquired hypothyroidism E03.9 244.9 T4, Free      TSH without Reflex   3. Special screening for malignant neoplasm of prostate Z12.5 V76.44 Psa screening   4. Pure hypercholesterolemia E78.00 272.0 Lipid Panel      Comprehensive Metabolic Panel   5. Chronic systolic congestive heart failure (HCC) I50.22 428.22 Comprehensive Metabolic Panel     428.0    6. PAF (paroxysmal atrial fibrillation) (HCC) I48.0 427.31 EKG 12 Lead          Plan:      Dm 2 - check a1c - consider change in meds  Please bring both your glucose monitor and your glucose records to your next appointment.     Thyroid - check lab    Cholesterol -check lab, cont med    chf - looks good and feels good today - ? Cause - call if recurs  afib - not on anticoag and high CHAD-VASC score - call out to Dr Elson ClanSaha or partners to discussed

## 2016-05-25 NOTE — Patient Instructions (Addendum)
Please bring both your glucose monitor and your glucose records to your next appointment.     If you don't hear from us or the cardiologists in the next two days, call.

## 2016-05-26 LAB — PSA SCREENING: PSA: 0.59 ng/mL (ref 0.00–4.00)

## 2016-05-26 LAB — TSH: TSH: 2.14 u[IU]/mL (ref 0.27–4.20)

## 2016-05-26 LAB — HEMOGLOBIN A1C
Hemoglobin A1C: 9.5 %
eAG: 226 mg/dL

## 2016-05-26 LAB — T4, FREE: T4 Free: 1.5 ng/dL (ref 0.9–1.8)

## 2016-06-02 ENCOUNTER — Ambulatory Visit: Admit: 2016-06-02 | Discharge: 2016-06-02 | Payer: MEDICARE | Attending: Internal Medicine | Primary: Internal Medicine

## 2016-06-02 DIAGNOSIS — E119 Type 2 diabetes mellitus without complications: Secondary | ICD-10-CM

## 2016-06-02 MED ORDER — SITAGLIPTIN PHOSPHATE 100 MG PO TABS
100 MG | ORAL_TABLET | Freq: Every day | ORAL | 1 refills | Status: DC
Start: 2016-06-02 — End: 2016-10-20

## 2016-06-02 MED ORDER — GLUCOSE BLOOD VI STRP
ORAL_STRIP | 1 refills | Status: DC
Start: 2016-06-02 — End: 2017-04-24

## 2016-06-02 NOTE — Patient Instructions (Signed)
The goal is to start the day with a fasting glucose between 80 and 130 and to have a   glucose at least under 180 two hours after supper, ideally under 140.  If your glucoses run higher than this frequently, please let me know.  Don't wait until your next appointment.

## 2016-06-02 NOTE — Progress Notes (Signed)
Subjective:      Patient ID: Carl Blair is a 76 y.o. male.    HPI     Chief Complaint   Patient presents with   ??? Diabetes      On medication for a fib rather than going for cardioversion since it's not constant afib  Discussed    Am 171-279 (today)  Pm 2 hr pp 165-261    Diet is better in last week    bp at home good    Discussed options - would prefer to try oral medications to injections  Was on Januvia years ago in addition to metformin in past, no problems    No edema    Review of Systems   Constitutional: Negative for chills and fever.   Respiratory: Negative for cough and shortness of breath.    Cardiovascular: Negative for chest pain and leg swelling.   Hematological: Negative for adenopathy. Does not bruise/bleed easily.       Objective:   Physical Exam   Constitutional:   obese   HENT:   Head: Normocephalic.   Cardiovascular: Normal rate.    irr irr   Pulmonary/Chest: Effort normal and breath sounds normal.   Abdominal: Soft. Bowel sounds are normal.   Musculoskeletal: He exhibits no edema.       Assessment:        ICD-10-CM ICD-9-CM    1. Type 2 diabetes mellitus without complication, without long-term current use of insulin (HCC) E11.9 250.00           Plan:      Dm - discussed   Add back januvia 100 mg daily side effects of the medication were discussed   Fu 3 m and prn   Please bring both your glucose monitor and your glucose records to your next appointment.

## 2016-10-07 MED ORDER — VALSARTAN-HYDROCHLOROTHIAZIDE 320-25 MG PO TABS
320-25 MG | ORAL_TABLET | Freq: Every day | ORAL | 1 refills | Status: DC
Start: 2016-10-07 — End: 2016-11-30

## 2016-10-07 NOTE — Telephone Encounter (Signed)
Last OV:   06/02/16  Next OV:  10/13/16   Last refilled:   05/25/16 #90 w/1 refill    Requested Prescriptions     Pending Prescriptions Disp Refills   . valsartan-hydrochlorothiazide (DIOVAN-HCT) 320-25 MG per tablet [Pharmacy Med Name: VALSARTAN/HCTZ 320-25MG  TABLET] 90 tablet      Sig: TAKE 1 TABLET BY MOUTH  DAILY

## 2016-10-13 ENCOUNTER — Encounter

## 2016-10-13 ENCOUNTER — Ambulatory Visit: Admit: 2016-10-13 | Discharge: 2016-10-13 | Payer: MEDICARE | Attending: Internal Medicine | Primary: Internal Medicine

## 2016-10-13 DIAGNOSIS — E119 Type 2 diabetes mellitus without complications: Secondary | ICD-10-CM

## 2016-10-13 LAB — COMPREHENSIVE METABOLIC PANEL
ALT: 17 U/L (ref 10–40)
AST: 16 U/L (ref 15–37)
Albumin/Globulin Ratio: 1.9 (ref 1.1–2.2)
Albumin: 4.3 g/dL (ref 3.4–5.0)
Alkaline Phosphatase: 105 U/L (ref 40–129)
Anion Gap: 13 (ref 3–16)
BUN: 19 mg/dL (ref 7–20)
CO2: 27 mmol/L (ref 21–32)
Calcium: 9.1 mg/dL (ref 8.3–10.6)
Chloride: 101 mmol/L (ref 99–110)
Creatinine: 1.1 mg/dL (ref 0.8–1.3)
GFR African American: >60 (ref >60)
GFR Non-African American: >60 (ref >60)
Globulin: 2.3 g/dL
Glucose: 198 mg/dL — ABNORMAL HIGH (ref 70–99)
Potassium: 4.7 mmol/L (ref 3.5–5.1)
Sodium: 141 mmol/L (ref 136–145)
Total Bilirubin: 0.3 mg/dL (ref 0.0–1.0)
Total Protein: 6.6 g/dL (ref 6.4–8.2)

## 2016-10-13 LAB — LIPID PANEL
Cholesterol, Total: 141 mg/dL (ref 0–199)
HDL: 27 mg/dL — ABNORMAL LOW (ref 40–60)
LDL Calculated: 83 mg/dL (ref <100)
Triglycerides: 155 mg/dL — ABNORMAL HIGH (ref 0–150)
VLDL Cholesterol Calculated: 31 mg/dL

## 2016-10-13 NOTE — Telephone Encounter (Signed)
Faxed request to Dr Okey Duprerawford to fax# [phone]

## 2016-10-13 NOTE — Patient Instructions (Signed)
Try to lose 5 pounds before your next visit.    Please bring both your glucose monitor and your glucose records to your next appointment.

## 2016-10-13 NOTE — Telephone Encounter (Signed)
Carl Neack, MD  [facility]   Caller: Unspecified (Today, 11:22 AM)            His eye doctor is Dr Okey Duprerawford - please request report - had eye exam 3-4 months ago.

## 2016-10-13 NOTE — Progress Notes (Signed)
Subjective:      Patient ID: Carl Blair is a 76 y.o. male.    HPI     Chief Complaint   Patient presents with   ??? Diabetes   ??? Hypertension   ??? Hyperlipidemia   ??? Thyroid Problem      Weight discussed    Fasting today     Glucose was high this am, 192, usually 140s  Didn't bring meter or records  Glucoses have been running lower, januvia appears to be helping    No chest pains  No dyspnea  No edema    Energy - "not like it used to be but it ain't bad"  Still can do yard work , Catering manageretc    Wife is stable    Review of Systems   Constitutional: Negative for chills, fever and unexpected weight change.   Respiratory: Negative for cough, shortness of breath and wheezing.    Cardiovascular: Negative for chest pain, palpitations and leg swelling.   Gastrointestinal: Negative for abdominal pain, constipation and diarrhea.   Musculoskeletal: Negative for arthralgias and myalgias.   Neurological: Negative for syncope, facial asymmetry, light-headedness and headaches.   Hematological: Negative for adenopathy. Does not bruise/bleed easily.       Objective:   Physical Exam   Constitutional: He is oriented to person, place, and time. He appears well-developed and well-nourished.   obese   Eyes: Pupils are equal, round, and reactive to light. No scleral icterus.   Neck: Normal range of motion. No JVD present.   Cardiovascular: Normal rate, regular rhythm, normal heart sounds and intact distal pulses.    Pulmonary/Chest: Effort normal and breath sounds normal.   Abdominal: Soft. Bowel sounds are normal.   Musculoskeletal: He exhibits no edema.   Normal vibration sense today   Neurological: He is alert and oriented to person, place, and time.   Psychiatric: He has a normal mood and affect. His behavior is normal. Judgment and thought content normal.       Assessment:        ICD-10-CM ICD-9-CM    1. Type 2 diabetes mellitus without complication, without long-term current use of insulin (HCC) E11.9 250.00 Hemoglobin A1C       Comprehensive Metabolic Panel   2. Chronic systolic congestive heart failure (HCC) I50.22 428.22 Comprehensive Metabolic Panel     428.0    3. Morbid obesity due to excess calories (HCC) E66.01 278.01    4. Acquired hypothyroidism E03.9 244.9 T4, Free      TSH without Reflex   5. Essential hypertension I10 401.9    6. Pure hypercholesterolemia E78.00 272.0 Comprehensive Metabolic Panel      Lipid Panel          Plan:      Dm 2 - check lab  Discussed  Refill med    chf - stable    Weight - discussed    Thyroid - check lab    htn - The current medical regimen is effective;  continue present plan and medications.     Cholesterol - check lab

## 2016-10-14 LAB — HEMOGLOBIN A1C
Hemoglobin A1C: 8.2 %
eAG: 188.6 mg/dL

## 2016-10-14 LAB — TSH: TSH: 2.92 u[IU]/mL (ref 0.27–4.20)

## 2016-10-14 LAB — T4, FREE: T4 Free: 1.6 ng/dL (ref 0.9–1.8)

## 2016-10-14 NOTE — Telephone Encounter (Signed)
Received today.

## 2016-10-20 MED ORDER — LEVOTHYROXINE SODIUM 100 MCG PO TABS
100 MCG | ORAL_TABLET | ORAL | 1 refills | Status: DC
Start: 2016-10-20 — End: 2017-03-15

## 2016-10-20 MED ORDER — ATORVASTATIN CALCIUM 10 MG PO TABS
10 MG | ORAL_TABLET | ORAL | 1 refills | Status: DC
Start: 2016-10-20 — End: 2017-04-14

## 2016-10-20 MED ORDER — CARVEDILOL 25 MG PO TABS
25 MG | ORAL_TABLET | ORAL | 1 refills | Status: DC
Start: 2016-10-20 — End: 2017-04-14

## 2016-10-20 MED ORDER — JANUVIA 100 MG PO TABS
100 MG | ORAL_TABLET | Freq: Every day | ORAL | 1 refills | Status: DC
Start: 2016-10-20 — End: 2017-03-15

## 2016-10-20 MED ORDER — OMEPRAZOLE 40 MG PO CPDR
40 MG | ORAL_CAPSULE | ORAL | 1 refills | Status: DC
Start: 2016-10-20 — End: 2017-05-10

## 2016-10-20 MED ORDER — GLIMEPIRIDE 2 MG PO TABS
2 MG | ORAL_TABLET | ORAL | 1 refills | Status: DC
Start: 2016-10-20 — End: 2017-04-14

## 2016-10-20 MED ORDER — METFORMIN HCL 1000 MG PO TABS
1000 MG | ORAL_TABLET | ORAL | 1 refills | Status: DC
Start: 2016-10-20 — End: 2017-03-15

## 2016-10-20 NOTE — Telephone Encounter (Signed)
Medication:   Requested Prescriptions     Pending Prescriptions Disp Refills   . levothyroxine (SYNTHROID) 100 MCG tablet [Pharmacy Med Name: LEVOTHYROXINE  0.1MG   TAB] 90 tablet      Sig: TAKE 1 TABLET BY MOUTH  DAILY   . glimepiride (AMARYL) 2 MG tablet [Pharmacy Med Name: GLIMEPIRIDE  2MG   TAB] 180 tablet      Sig: TAKE 1 TABLET BY MOUTH TWO  TIMES DAILY WITH MEALS   . omeprazole (PRILOSEC) 40 MG delayed release capsule [Pharmacy Med Name: OMEPRAZOLE  40MG   CAP] 90 capsule      Sig: TAKE 1 CAPSULE BY MOUTH  DAILY   . JANUVIA 100 MG tablet [Pharmacy Med Name: JANUVIA  100MG   TAB] 90 tablet      Sig: TAKE 1 TABLET BY MOUTH  DAILY   . metFORMIN (GLUCOPHAGE) 1000 MG tablet [Pharmacy Med Name: MetFORMIN 1000MG  TABLET] 180 tablet      Sig: TAKE 1 TABLET BY MOUTH  TWICE A DAY WITH MEALS   . carvedilol (COREG) 25 MG tablet [Pharmacy Med Name: CARVEDILOL  25MG   TAB] 180 tablet      Sig: TAKE 1 TABLET BY MOUTH TWO  TIMES DAILY   . atorvastatin (LIPITOR) 10 MG tablet [Pharmacy Med Name: ATORVASTATIN  10MG   TAB] 90 tablet      Sig: TAKE 1 TABLET BY MOUTH  DAILY       Last Filled:  05/25/16 #90 w/1 refill    Patient Phone Number: [phone] (home) [phone] (work)    Last appt: 10/13/2016   Next appt: Visit date not found    Last Labs DM:   Lab Results   Component Value Date    LABA1C 8.2 10/13/2016     Last Lipid:   Lab Results   Component Value Date    CHOL 141 10/13/2016    TRIG 155 10/13/2016    HDL 27 10/13/2016    HDL 34 05/04/2010    LDLCALC 83 10/13/2016     Last PSA:   Lab Results   Component Value Date    PSA 0.59 05/25/2016     Last Thyroid:   Lab Results   Component Value Date    TSH 2.92 10/13/2016    T4FREE 1.6 10/13/2016       Last OARRS: No flowsheet data found.    Preferred Pharmacy:   [facility] [place] [address] [place] [phone] F [phone]  [address] [place]  [address]  [place] [id number]  Phone: [phone] Fax: [phone]    [facility] [address] - [place] - [id number] - P [phone] - F [phone]  [address]  [place] [id number]  Phone: [phone] Fax: [phone]

## 2016-11-30 NOTE — Telephone Encounter (Signed)
Changed to losartan hctz 100-25 one a day.  I sent the Rx to the pharmacy. Please let him know.

## 2016-11-30 NOTE — Telephone Encounter (Signed)
Pt called in stating that [facility] called him about his Valsartan being on nationwide recall & that they cannot fill it.  Please prescribe pt an alternative medication for this refill & call pt back at [phone]

## 2016-12-01 MED ORDER — LOSARTAN POTASSIUM-HCTZ 100-25 MG PO TABS
100-25 MG | ORAL_TABLET | Freq: Every day | ORAL | 1 refills | Status: DC
Start: 2016-12-01 — End: 2017-03-22

## 2016-12-01 NOTE — Telephone Encounter (Signed)
Pt notified that script for Losartan-HCTZ has been sent into the pharmacy.

## 2017-02-17 NOTE — Unmapped (Signed)
Spoke to the patient and confirmed appt for 11/13.

## 2017-02-21 ENCOUNTER — Ambulatory Visit: Admit: 2017-02-21 | Discharge: 2017-02-21 | Payer: MEDICARE | Attending: Family

## 2017-02-21 DIAGNOSIS — G4733 Obstructive sleep apnea (adult) (pediatric): Secondary | ICD-10-CM

## 2017-02-21 NOTE — Unmapped (Signed)
Sleep Apnea   Sleep apnea is a sleep disorder characterized by abnormal pauses in breathing while you sleep. When your breathing pauses, the level of oxygen in your blood decreases. This causes you to move out of deep sleep and into light sleep. As a result, your quality of sleep is poor, and the system that carries your blood throughout your body (cardiovascular system) experiences stress. If sleep apnea remains untreated, the following conditions can develop:  ?? High blood pressure (hypertension).  ?? Coronary artery disease.  ?? Inability to achieve or maintain an erection (impotence).  ?? Impairment of your thought process (cognitive dysfunction).  There are three types of sleep apnea:  1. Obstructive sleep apnea Pauses in breathing during sleep because of a blocked airway.  2. Central sleep apnea Pauses in breathing during sleep because the area of the brain that controls your breathing does not send the correct signals to the muscles that control breathing.  3. Mixed sleep apnea A combination of both obstructive and central sleep apnea.  RISK FACTORS  The following risk factors can increase your risk of developing sleep apnea:  ?? Being overweight.  ?? Smoking.  ?? Having narrow passages in your nose and throat.  ?? Being of older age.  ?? Being male.  ?? Alcohol use.  ?? Sedative and tranquilizer use.  ?? Ethnicity. Among individuals younger than 35 years, African Americans are at increased risk of sleep apnea.  SYMPTOMS   ?? Difficulty staying asleep.  ?? Daytime sleepiness and fatigue.  ?? Loss of energy.  ?? Irritability.  ?? Loud, heavy snoring.  ?? Morning headaches.  ?? Trouble concentrating.  ?? Forgetfulness.  ?? Decreased interest in sex.  DIAGNOSIS   In order to diagnose sleep apnea, your caregiver will perform a physical examination. Your caregiver may suggest that you take a home sleep test. Your caregiver may also recommend that you spend the night in a sleep lab. In the sleep lab, several monitors record  information about your heart, lungs, and brain while you sleep. Your leg and arm movements and blood oxygen level are also recorded.  TREATMENT  The following actions may help to resolve mild sleep apnea:  ?? Sleeping on your side. ??  ?? Using a decongestant if you have nasal congestion. ??  ?? Avoiding the use of depressants, including alcohol, sedatives, and narcotics. ??  ?? Losing weight and modifying your diet if you are overweight.  There also are devices and treatments to help open your airway:  ?? Oral appliances. These are custom-made mouthpieces that shift your lower jaw forward and slightly open your bite. This opens your airway.  ?? Devices that create positive airway pressure. This positive pressure splints your airway open to help you breathe better during sleep. The following devices create positive airway pressure:  ?? Continuous positive airway pressure (CPAP) device. The CPAP device creates a continuous level of air pressure with an air pump. The air is delivered to your airway through a mask while you sleep. This continuous pressure keeps your airway open.  ?? Nasal expiratory positive airway pressure (EPAP) device. The EPAP device creates positive air pressure as you exhale. The device consists of single-use valves, which are inserted into each nostril and held in place by adhesive. The valves create very little resistance when you inhale but create much more resistance when you exhale. That increased resistance creates the positive airway pressure. This positive pressure while you exhale keeps your airway open, making it easier   to breath when you inhale again.  ?? Bilevel positive airway pressure (BPAP) device. The BPAP device is used mainly in patients with central sleep apnea. This device is similar to the CPAP device because it also uses an air pump to deliver continuous air pressure through a mask. However, with the BPAP machine, the pressure is set at two different levels. The pressure when you  exhale is lower than the pressure when you inhale.  ?? Surgery. Typically, surgery is only done if you cannot comply with less invasive treatments or if the less invasive treatments do not improve your condition. Surgery involves removing excess tissue in your airway to create a wider passage way.  Document Released: 03/18/2002 Document Revised: 09/27/2011 Document Reviewed: 08/04/2011  ExitCare?? Patient Information ??2014 [employer]      Instructions for your SECOND Sleep Study            LOCATION AND DIRECTIONS  ??? Your study will be conducted at our [facility] on [address] [place], [place] [address] (Please note this is a different location from where you had your clinic appointment).   ??? Driving Directions: Once you turn onto [address] go straight past the first stop sign, at the second stop sign, turn right. Once you turn, you will see a ???[facility]??? sign. You can park in the lot in front of it. The door under the awning is the Sleep Medicine Center. Go through the sliding doors, there is doorbell on the wall, please press the button and a technician will let you in.   ??? We do have a location in [place] at [facility]. If you would like to schedule your appointment there please inform our staff. Thank you.   ??? Overnight studies begin at 8pm (please do not arrive earlier). The wake up time for the study is between 6-7 am. If you need to wake up sooner, please notify the technician before going to sleep.     ??? If someone will be picking you up in the morning following your study, you must inform the sleep technician the night before, so they can allow you time to call for your ride.   ??? Please note there is NO nurse available through the night if you need assistance you can have a family member or friend stay with you overnight. Most rooms have a recliner for your guest. Please inform the scheduler as you make your appointment.   ??? If you have mobility concerns and may need  additional assistance transferring from a wheelchair to bed for instance please inform the staff at the time of making your appointment as we will need to make arrangements.   ??? If you need to cancel or re-schedule, you must give us at least 24 HOURS notice or you may be charged a cancellation fee. Please call our office at [phone] (M-F 8 am-5 pm) or our sleep lab at [phone] (after hours, leave a voicemail) to inform us of changes.     WHAT TO BRING  ??? You should bring your toiletries, sleepwear, a robe or sweater (in case you feel cold), and a change of clothing for when you leave. Wear your regular clothing when you arrive. Please BRING your night-time clothes with you. Please note leads will be placed on your scalp and chest. Please wear something loose and comfortable.  You will be given time to change.   ??? Medications: Please bring a complete list of all your medications (  including vitamins, supplements or medications you take as needed). BRING your nighttime medications with you, they have to be in their original packaging (you will be given an opportunity to take your nighttime medications before you sleep). DO NOT BRING YOUR MEDICATION IN A PILL BOX. If you normally use sleep aids or medications for restless legs or periodic limb movement disorder such as Ambien, Lunesta, Melatonin, Mirapex, Requip etc please bring them with you. Notify the technician of all medications you take that night.   ??? BRING your insurance card and picture ID.   ??? Optional: Bring your own pillow and/or blanket, snacks and sweater.   ??? If you normally wear a dental guard at night please bring it with you for the study. ASK YOUR PROVIDER IF YOU SHOULD DO THE STUDY WITH THE GUARD OR WITHOUT THE BITE GUARD / DENTAL GUARD.   ??? If you have a PAP device please bring your equipment and supplies to the study. If any pressure changes are needed a preliminary adjustment may be made.    WHAT TO DO BEFORE YOUR SLEEP TEST (Please follow  the same instructions for a home sleep test)    ??? Shower before you come for the test (this way there are no oils on the skin for the study). DO NOT apply lotion, creams, make-up, nail polish or put hair products (such as hair spray or gel). Your hair should be dry when you arrive. Each room has its own bathroom and shower. You will be able to shower the following morning if you wish. Be sure to bring your own toiletries as these are NOT provided.   ??? Male Patients: Unless you have a beard, please be ???clean shaven???  ??? DO NOT take any naps on the day of the study.   ??? DO NOT have any chocolate or caffeine after 2 pm.   ??? Please inform the technician if you have any allergies or sensitivities, before the technician starts to apply the leads (stickers).    ??? If you have a terrible cold or received some bad news on the day of the study, please contact our office to reschedule. Please follow all protocols regarding cancellation and no show policies. If you have any questions or concerns please call our office for further details, call [phone].     AFTER YOUR SLEEP STUDY  ?? All patients are advised to ensure they have scheduled a follow up appointment to discuss results from the sleep study (in lab study, home sleep test or PAP calibration study)  ?? If you do not already have an appointment scheduled please call our office at [phone] to schedule one as soon as your sleep study is completed. Thank you.   ?? If your study indicated that you have obstructive sleep apnea (OSA) you will be advised to set up a second sleep study. The purpose of the second study is to determine the setting for your PAP equipment (positive airway pressure device). On this night you will be shown a variety of masks. You will choose the one most comfortable to you. The technician will start at the lowest pressure and keep increasing it by one level until an optimal pressure is reached (which is when your oxygen level is normal and you are  not snoring anymore).   ?? Within 7-10 business days a final prescription will be sent to a DME (medical supply company) covered by your insurance. The DME company should contact you within 7-14 business days. If   you have not received a call from the DME Company regarding your device, PLEASE CALL US so we can further investigate.   ?? Once you have obtained your device and supplies use it for a 2-3 weeks and ensure you have a follow up appointment scheduled with your provider. Please bring all your equipment to EVERY office visit including your machine, tubing, mask and power cord. Do not travel with water in the water chamber. We do a thorough check on your device at every visit.   ?? Please note that if you are provided with a mask during your overnight sleep study or by your medical supply company you have 30 days to make any changes or exchanges. After the 30 day period in most cases no changes can be made for up to 3 months or longer depending on your insurance.       PREPARING TO USE POSITIVE AIRWAY PRESSURE DEVICE AT HOME    Setting up your equipment (To be used at all times when sleeping, including naps)  ?? Place your machine on a hard, level surface close to where you sleep. Plug your machine into a grounded outlet. If you need to use an extension cord, we suggest you use a surge protector.   ?? Connect your hose to the airflow outlet and connect the other end of the hose to your mask.   ?? Using distilled water only, fill the water chamber. Do NOT fill above the maximum line.   ?? Replace the distilled water daily, as bacteria may start to grow.   ?? If you have nasal dryness or a dry mouth, increase the humidity level one increment at a time.  ?? If your sleeping environment is cool, you can place the PAP hose under your covers to prevent condensation in the tubing.   ?? INSTRUCTIONS ON HOW TO PUT ON YOUR MASK: Place your mask on your face loosely. Most patients make the mistake of putting their masks on too  tight.    o Turn on your machine. Lay back on your pillow to adjust your mask, as it will probably fit differently when you sit up vs. lying down. Remember, start very loose, and tighten as needed. A loose mask is much better than a tight mask. You will feel some air from the exhalation port on the mask. This is normal.   ?? Please note that if you are given a mask during your overnight sleep study or by your DME (medical supply company) you have 30 days to make any changes or exchanges. After the 30-day period, you may not be able to return the mask or make changes for up to 3 months or longer depending on your insurance. If you have any questions please contact your DME (medical supply company).      We hope you do well adjusting to using your PAP device. PAP therapy is the gold standard treatment for Obstructive Sleep Apnea.     Few features we would like to highlight on your PAP device.       The first thing we want to point out is the RAMP feature. If you need temporary relief from the full therapy pressure of your machine, you can press the ramp button.  Respironics device: Press the elongated triangle button once.   F&P device: Press and hold the large dial for 3 seconds.    ResMed device: Ramp feature will automatically activate when you place the mask on your face (this is a   built in feature). To reactivate it just pull the mask away from you face and reposition it. Ramp feature will reactivate.     The Ramp feature temporarily allows you to reduce the pressure on your machine. Your machine can be set to ramp up within 5-45 minutes to your full therapy pressure. If you are not asleep by the time your machine reaches full pressure, and you need relief, you can re-start the ramping process by pressing the button again. You can use the ramp feature as many times and as often as you like. This is a useful feature if you use the restroom in the middle of the night.     The next thing we would like to point out  is the HUMIDIFICATION feature on your PAP device. Most machines are pre-set for a humidity setting of 3 or 4. You can manually change the humidifier settings on your machine when you turn the machine on. Increase the humidity level one increment at a time.  Respironics device: Turn the on/off button to the left for less humidification or the right for more humidification. Humidification ranges from 1-5. If you have a heated hose talk to your provider or DME who can teach you how to make adjustments.   F&P device: Turn the large dial to the right. Humidification ranges from 1-7.   ResMed device: Attach the humidifier component to the main unit, turn   the dial and choose the water droplet icon; press the button once. Then turn the dial to adjust your humidity setting. Press button a final time to save the setting. Humidification ranges from 1-8 (depending on device model). The newer device has an Auto humidification feature, if you have a heated hose talk to your provider or DME to further assistance.     For example, if you are waking up with a dry mouth, you should turn your humidifier up one number at a time. If you are getting excess condensation in your mask or tubing, you should turn the humidifier down one number at a time.      A prescription will be sent to a DME (medical supply company) covered by your insurance. The DME Company should contact you within 7-10 business days once they receive the prescription. If you have not received a call from the DME Company regarding your device, PLEASE CALL US so we may investigate further.     Once set up with your PAP equipment, please call our office and set up an appointment with your sleep provider. At that visit, we will obtain a download from your machine, address any issues with the mask or device, review cleaning instructions, replacement supply schedule and answer any questions you may have.     After receiving your PAP device please remember you need to bring  all your equipment to EVERY office visit including your machine, tubing, mask and power cord. Make sure you do not travel with water in the humidifier (always empty it every morning). A thorough check of all your equipment will take place at each appointment.          CLEANING INSTRUCTIONS FOR PAP EQUIPMENT     Keeping your equipment and supplies clean is very important.     REMINDER: Only use DISTILLED WATER in your humidifier, Empty water daily.    DAILY  Mask and tubing:   ?? Wash your face before applying mask  ?? Wash mask and tubing with baby shampoo and warm water.       Humidifier:   ?? Empty water in reservoir  ?? Clean with baby shampoo and warm water  ?? Rinse, then air dry    WEEKLY  Mask and tubing:   ?? Soak your mask and tubing in 1 part vinegar and 3 parts water for 30 minutes. Rinse, and allow to air dry.     Humidifier:   ?? Wash with warm water and baby shampoo  ?? Soak in 1 part vinegar and 3 parts water for 30 minutes  ?? Rinse with warm water and allow to air dry    Machine Exterior:   ?? Wipe with a clean damp cloth    MONTHLY AND/OR AS NEEDED  ?? Reusable foam filters (black filter)- wash in warm water with baby shampoo. Rinse well and dry with paper towel  ?? Disposable felt filter (white filter)- Replace filter every two weeks to once a month    NOTE: If you are having repeated sinus and /or respiratory infections, dirty equipment may be the cause. It may help to clean and disinfect your equipment more frequently    TRAVELING  Always make sure the humidifier is empty when traveling. (including doctor appointments, air travel or long distance driving).  When flying, always carry your PAP device with you as a carry on item, NEVER check it in as baggage. We can provide you with a letter stating it is a medical device, talk to your provider.    Always make sure you have your mask and tubing with you. You will need appropriate plug adapters when traveling outside the [place].    If travelling by air and  you are unable to carry distilled water with youuse bottled water (no more than 2 weeks). DO NOT USE TAP WATER.     Equipment Replacement Schedule    To get the most benefit from your PAP therapy, your equipment should be replaced when necessary based on wear and tear. For example, your mask may need to be replaced if you notice it is cracked or the seal is leaking. If your tubing is torn, it needs to be replaced.   If you equipment is showing signs of wear, you may be entitled to replace it. The replacement schedule for Medicare patients is shown below. If you are NOT a Medicare patient, please check with your DME (Durable Medical Supply) provider for your individual insurance policy???s replacement schedule.     Supplies   Medicare Medicaid My Insurance Plan  Mask    1 per 3 m 1 per year _______________  Nasal cushion  2 per m 1 per 6 m _______________  Pillows cushion  2 per m 1per 6 m _______________  Full-face cushion  1 per m 1per 6 m _______________  Headgear   1 per 6 m 1 per year _______________  Water Chamber  1 per 6 m N/A  _______________  Chinstrap   1 per 6 m 1 per 6 m       _______________  Tubing/Hose   1 per 3 m 1 per year _______________  Heater wire tubing  1 per 3 m N/A  _______________  Filter, disposable (white) 2 per m 1 per m _______________  Filter, particle foam (black) 1 per 6 m 4 per year _______________  Therapy device  1 per 5 yrs  ?  _______________    Follow up and compliance goals for Medicare and Medicaid patients   (most private insurances are now following similar rules)  Medicare and Medicaid   require patients to follow-up with their provider after they are set up with their PAP equipment. The follow up appointment should be within 31-90 days after receiving their equipment. If patient does not follow up as directed there might be issues with the cost of the device being covered by the insurance. Therefore please ensure you make a follow up appointment with your sleep provider.         Insurance also requires that the patient meet some compliance goals such as the download should indicate patient has been wearing the device for at least 4 hours per night AND they are using the device for 30 nights in a row.     Key points to remember  ?? You should wear the device every time you sleep, no matter when you sleep or where you sleep. When you nap or travel or go on vacation you need to use the machine when asleep.   ?? If you sleep without the device your Obstructive Sleep Apnea (OSA) is untreated and your risk of heart attacks and strokes are higher.   ?? Clean the device on a regularly basis to avoid sinus infections and pneumonias   ?? Never travel with water in the reservoir   ?? Replace supplies on a regularly basis (see schedule below). You may need to contact your Durable Medical Equipment company (DME) and request supplies (just like you would medications from a pharmacy).  ?? Alcohol will worsen OSA, Alcohol can decrease your drive to breathe, slowing your breathing and making your breaths shallow. In addition, it may relax the muscles of your throat, which may make it more likely for your upper airway to collapse. REMEMBER TO ALWAYS WEAR YOUR PAP DEVICE WHEN SLEEPING.   ?? Certain medications can also worsen OSA such as some pain medications, anti-anxiety medications, muscle relaxants, testosterone supplementation. Please inform your sleep provider which medications you take on a regular basis.   ?? All patients are advised to make follow up appointments with the provider after they are set up with their PAP device. Most insurance plans now require the patient set up a follow up appointment with the provider 31-90 days after they have been set up with their device and at least once a year thereafter. Discuss these issues with your provider for more details.   ?? Please note when you are provided with a mask you have a manufacturer 30 day guarantee that comes with it. If you do not like that mask  for any reason contact your DME and request an exchange. After 30 days you may not be eligible for a new mask for another 3-6 months depending on insurance. Talk to your DME or sleep provider for more details.   ?? If you are struggling with nasal congestion try to use a nasal sinus rinse available over the counter such as Netti Pot 1 hour before bedtime (you may have to use it daily for a 7-10 days then as needed). You can also try over the counter Flonase or Nasacort 1-2 sprays in each nostril at bedtime.   ?? If you ever have problems with the device or the plan discussed with your sleep provider has not come to fruition please contact your provider by phone or via MyChart. Thank you      DROWSY DRIVING TIPS    These suggestions will help prevent you from the risk of drowsy driving.     1. If you feel tired or drowsy don't drive. Sleepiness is   a major cause of motor vehicle accidents and accounts for 40% of all fatal crashes reported on the [place] thruway. No matter how much you think you can control sleepiness, you can't.     2. Ensure you follow your doctor's advice about the treatment for your sleep disorder. For example, if you have sleep apnea and use CPAP, ensure you use it fully the night before your trip.     3. Get a good night's sleep before driving. Do not cut yourself short of sleep if you plan a long drive the next day. Get to bed early and do not stay up late packing.     4.Avoid alcohol both the night before your trip and during your trip. Alcohol will disrupt sleep and make you more tired the next day. Sleepiness and alcohol are additive in increasing impairment of your driving ability.     5. Avoid any sedative medications, including sedative antihistamines that are often contained in cold or allergy medications, the night before you drive as they may have long lasting effects the next day.     6. Travel during non-sleeping hours. Accidents due to sleepiness are more common during the nighttime hours.      7. If sleepy, stop and rest. Drink coffee and walk around. Take a brief nap, lock your car doors and take a nap in your car if you are sleepy. Have a 10-15 minute break after every 2 hours of driving.     8. Drive with a companion. Share the driving. Relax in the back seat until it is your time to share the driving again.     These are only guidelines; please discuss your diagnosis and treatment with your sleep medicine provider. Drive safely.

## 2017-02-21 NOTE — Unmapped (Signed)
Message to [facility] to advise of office notes, Pressure Change order to update records.

## 2017-02-21 NOTE — Unmapped (Signed)
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 male with OSA on CPAP is doing well. No snoring, or no excessive sleepiness. He has no drowsy driving.  He is getting his supplies and not having any problems with equipment or cleaning. No rain out. Likes his mask and pressure his high around 4 am. Occasionally has a dry mouth.      No other changes in his medical, surgical, social or family history or any hospitalizations or changes in medications since the last visit    Previous UPPP, oral venting and likes his FF mask       HTN, DM, atrial fib - corrected with mini-maze procedure) GERD, dylipidemia      DEVICE/ MASK/ DME INFORMATION  DME: medical services  Mask type: Airfit F10 large FF mask    Compliance Data Download: 01/18/2017-02/16/2017  Device Model: RESMED S10  Mask: airfit F10 med  PAP Pressure: min 10 max 18 cm H2O average pressure 17.8 mg  Percent Days with Device Usage: 100 %  Percent of Days with usage >= 4 hours: 100 %  Average Usage (number of hours used all days): 8 hrs 12 min  AHI: 12.1  Leak 11.5 average      EPWORTH SLEEPINESS SCALE: (at or above 10 is abnormal and 24 is the maximum score)  Epworth Sleepiness Scale 04/14/2011 02/22/2016   Sitting and reading 2 1   Watching TV 3 2   Sitting, inactive in a public place (e.g. a theatre or a meeting) 1 0   As a passenger in a car for an hour without a break 2 0   Lying down to rest in the afternoon when circumstances permit 3 2   Sitting and talking to someone 0 0   Sitting quietly after a lunch without alcohol 1 1   In a car, while stopped  for a few minutes in traffic 0 0   Total score 12 6       Sleep Schedule  Bedtime is 12a. Falls asleep within 15 minutes, with 1 nighttime awakenings for bathroom. Falls back asleep in 10 minutes. Sleeps on own bed. Awakens at 7a during the week and same on the weekends.   Patient thinks they get approximately 6 hours of sleep a night.      Patient does takes naps 1 per week for 30 min    Past Medical Hx:   Past Medical History:   Diagnosis Date   ??? Atrial fibrillation (CMS Dx)     corrected with Mini-maze procedure   ??? Cataract     left eye   ??? Diabetes mellitus (CMS Dx)     type 2   ??? Dilated cardiomyopathy (CMS Dx)    ??? Hearing loss     left ear   ??? Hypercholesteremia    ??? Hypertension    ??? Sleep apnea    ???  Thyroid disease     hypothyroidism     SurgHx:   Past Surgical History:   Procedure Laterality Date   ??? COLONOSCOPY W/ POLYPECTOMY      benign  has at 3 plus procedures   ??? HERNIA REPAIR  9/09    right inguinal, mini maze   ??? HERNIA REPAIR  2/10    herniated lungs bilateral (repaired with mesh)   ??? mini-maze      corrected his A-fib   ??? SEPTOPLASTY     ??? UVULOPALATOPHARYNGOPLASTY     ??? UVULOPALATOPHARYNGOPLASTY      has had esophageal dilations in the past (Dr.Decter)   ??? WISDOM TOOTH EXTRACTION       Problem List:  Patient Active Problem List   Diagnosis   ??? Other, multiple, and unspecified sites, insect bite, nonvenomous, without mention of infection(919.4)   ??? Hemangioma of intra-abdominal structures   ??? Other specified disorders of liver   ??? 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 Disorder of esophagus   ??? Chest pain   ??? Contact dermatitis and other eczema, due to unspecified cause   ??? Morbid obesity (CMS Dx)   ??? Obstructive sleep apnea   ??? Hypothyroidism   ??? Type II or unspecified type diabetes mellitus without mention of complication, not stated as uncontrolled (CMS Dx)   ??? Other and unspecified hyperlipidemia   ??? Essential hypertension   ??? Atrial fibrillation (CMS Dx)   ??? Congestive heart  failure (CMS Dx)   ??? Hemorrhoids   ??? Esophageal reflux   ??? Pain in limb   ??? Special screening for malignant neoplasm of prostate   ??? Multiple rib fractures   ??? OSA (obstructive sleep apnea)     CURRENT MEDICATIONS:   Current Outpatient Prescriptions   Medication Sig   ??? apixaban Take 5 mg by mouth 2 times a day.   ??? aspirin Take 81 mg by mouth daily.   ??? atorvastatin Take 10 mg by mouth at bedtime.    ??? carvedilol Take 25 mg by mouth 2 (two) times daily.     ??? glimepiride Take 1 tablet by mouth  every morning before  breakfast   ??? GLUC/CHON-MSM#1/C/MANG/BOS/BOR (OSTEO BI-FLEX ORAL) Take by mouth. OSTEO BI-FLEX JOINT SHIELD TABS    ??? glyBURIDE-metformin Take 2 tablets by mouth 2 times a day.    ??? levothyroxine Take 100 mcg by mouth every morning.    ??? omeprazole Take 40 mg by mouth every morning.    ??? SAW PALMETTO XTR/ZINC PICOLIN (SAW PALMETTO EXTRACT ORAL) Take 2 tablets by mouth 2 times a day.    ??? valsartan-hydrochlorothiazide Take 1 tablet by mouth every morning.    ??? acetaminophen Take 2 tablets (1,000 mg total) by mouth 3 times a day.   ??? polyethylene glycol Take 17 g by mouth 2 times a day as needed (for constipation).   ??? senna-docusate Take 1 tablet by mouth at bedtime.     No current facility-administered medications for this visit.       Allergies  Allergies as of 02/21/2017   ??? (No Known Allergies)     Family Hx:   Family History   Problem Relation Age of Onset   ??? Diabetes Mother         type 2   ??? Hypertension Mother    ??? Diabetes Sister         type 2   ??? Coronary artery disease  Sister    ??? Coronary artery disease Brother    ??? Hypertension Brother    ??? Diabetes Brother         type 2   ??? Coronary artery disease Sister    ??? Hypertension Brother      Social Hx:   Patient  reports that he has never smoked. He has never used smokeless tobacco. He reports that he does not drink alcohol or use drugs.  Please see attached form for further details.     Review of Systems  General ROS: all negative  Ophthalmic  ROS: negative  Ears, nose, mouth, throat, and face ROS: negative  Respiratory ROS: all negative  Cardiovascular ROS: negative  Gastrointestinal ROS: constipation  Genitourinary OZH:[id number]  Integument/breast ROS: negative  Hematologic/lymphatic ROS: negative  Musculoskeletal NGE:[id number]  Neurological ROS: negative  Psychological ROS: all negative  Endocrine ROS: all  negative  Allergy and Immunology ROS: negative    Denies: suicidal ideas, irritability, depression, racing mind, changes in appetite, fainting spells, alcoholism, tremors, using illegal drugs, worrying about inability to sleep, insomnia, chest pain and shortness of breath,    I have reviewed the review of system and advised patient to contact primary care physician or specialist for non sleep related issues.    Previous Report(s) Reviewed: historical medical records, lab reports, office notes      The following portions of the patient's history were reviewed and updated as appropriate: allergies, current medications, past family history, past medical history, past social history, past surgical history and problem list.    OBJECTIVE :   BP 158/80    Pulse 82    Resp 16    Ht 6' (1.829 m)    Wt (!) 257 lb 9.6 oz (116.8 kg)    SpO2 97%    BMI 34.94 kg/m??    Wt Readings from Last 3 Encounters:   02/21/17 (!) 257 lb 9.6 oz (116.8 kg)   02/22/16 (!) 263 lb (119.3 kg)   05/12/15 (!) 260 lb (117.9 kg)       Physical Exam   Constitutional: alert and in no apparent distress. Well-developed and well-nourished.   Head: normocephalic and atraumatic.   Ears: appear normal externally bilaterally  Eyes: Conjunctivae are normal. Pupils are equal, round, no scleral icterus, drainage or redness.   Nose: Columella normal. Positive for nasal congestion. No deviated septum.   Mouth : Mallampati 4. Crowded airway. Oropharynx is clear, moist and pink. UPPP,  Macroglossia w/ ridging. Dentition poor missing teeth.  Neck:  grossly normal range of motion.  Trachea midline.     Cardiovascular: normal rate and regular rhythm. Neg murmur. No peripheral edema   Pulmonary/Chest: effort normal and breath sounds normal. No stridor. No respiratory distress, no wheezes, no rales.   Abdominal: deferred   Musculoskeletal: Grossly normal range of motion and strength. Normal gait.   Neurological:  alert. Cranial nerves II-XII grossly intact. Strength 5/5 in upper extremities bilaterally. Normal gait.  No tremor.   Skin: skin is warm and dry. No rash noted. Multiple healing skin lesions on arms and face  Psychiatric: normal speech, normal mood and affect, behavior is normal. Judgment and thought content normal.     Labs Reviewed:   No results found for: IRON  Lab Results   Component Value Date    WBC 8.7 02/07/2013    HGB 13.0 (L) 02/07/2013    HCT 38.7 02/07/2013    MCV 84.7 02/07/2013    PLT 219 02/07/2013  Lab Results   Component Value Date    GLUCOSE 146 (H) 05/12/2015    BUN 22 05/12/2015    CO2 29 05/12/2015    CREATININE 1.12 05/12/2015    K 4.2 05/12/2015    NA 136 05/12/2015    CL 99 05/12/2015    CALCIUM 9.5 05/12/2015     Lab Results   Component Value Date    HGBA1C 7.2 (H) 04/15/2008     Lab Results   Component Value Date    TSH 1.50 04/15/2008    FREET4 1.6 04/15/2008     Transthoracic Echocardiography    Patient: ?? ??Rachael, Zapanta  MR #: ?? ?? ?? [id number]  Account:  Study Date: 02/17/2016  Gender: ?? ?? M  Age: ?? ?? ?? ??75  DOB: ?? ?? ?? ??09-11-1940  Room: ?? ?? ?? [facility]    ??ORDERING ?? ??Eda Keys, MD  ??OPERATOR ?? ??Mardee Postin RVT  ??PERFORMING ??[facility], [place]    --------------------------------------------------------------------    Procedure:ECHO 2D ?? ?? ?? ??Order:ECHO 2D ?? ?? ?? ??Accession  COMPLETE (TTE) ?? ?? ?? ?? ?? COMPLETE (TTE) ?? ?? ?? Number:[id number]    --------------------------------------------------------------------  Indications: ?? ?? ??Atrial fibrillation - paroxysmal 427.31.  ??PACs  427.61.    --------------------------------------------------------------------  PMH: ?? Atrial fibrillation. ??Risk factors: ??Hypertension. Diabetes  mellitus. Dyslipidemia.    --------------------------------------------------------------------  Study data: ?? Study status: ??Routine. ??Procedure: ??Transthoracic  echocardiography. Image quality was technically difficult. Scanning  was performed from the parasternal, apical, and subcostal acoustic  windows. ?? ?? ?? ?? ??Transthoracic echocardiography. ??M-mode, complete  2D, complete spectral Doppler, and color Doppler. ??Birthdate:  Patient birthdate: June 04, 1940. ??Age: ??Patient is 76yr old. ??Sex:  Gender: male. ??Height: ??Height: 72in. Height: 72in. ??Weight:  Weight: 259.5lb. Weight: 259.4lb. ??Body mass index: ??BMI:  35.3kg/m^2. ??Body surface area: ?? ??BSA: 2.54m^2. ??Blood pressure:  ??151/91 ??Patient status: ??Outpatient. ??Study date: ??Study date:  02/17/2016. Study time: 09:48 AM. ??Location: ??Echo laboratory.  ??  --------------------------------------------------------------------  Study Conclusions    - Left ventricle: The cavity size was normal. Wall thickness was  ????normal. Systolic function was normal. The estimated ejection  ????fraction was in the range of 60% to 65%. Wall motion was normal;  ????there were no regional wall motion abnormalities.  - Mitral valve: Mild regurgitation.  - Left atrium: The atrium was mildly dilated.  - Right ventricle: Systolic function was normal.    ASSESSMENT  Richard Terriquez Johnsonis a 76 y.o. male   OSA on CPAP having trouble with high pressure and elevated AHI 12.1/hr. 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 He has excellent compliance 100% despite the high pressure.  BMI 35    HTN, DM, atrial fib - corrected with mini-maze procedure) GERD, dylipidemia  UPPP- oral venting and now using FF mask    PLAN:   1. OSA on CPAP  Reviewed download pt  Discussed central sleep apnea and chey[place] stokes breathing  ECHO 2017  EF 60-65%   increased pressure  min 10 max 20 cmH2O script written  yearly script written  ordered BIPAP titration and possible ASV if having mostly central events switch to ASV     DME : Medical services      Patient was educated regarding OSAHS pathophysiology, relationship with cardiovascular, cerebrovascular and metabolic morbidity and mortality, issues related to PAP adherence, its use, cleaning and maintenance.  if any issues were to arise before their next office visit they may follow up sooner or contact  their DME company for assistance.  Patient has been advised that anything preventing them from using the device on a daily basis is a concern that should be relayed to [place] their sleep provider. Patient verbalized understanding and agrees to comply.     Exercise and weight loss encouraged    The patient was educated on the dangers of drowsy driving; advised to avoid long distance or night driving. Instructed to take a short nap on the side of the road if sleepy when driving. Tips to avoid drowsy driving were provided to patient.     Sleep hygiene techniques were provided to patient.     Return to clinic  For results    SLEEP QUALITY MEASURES:   Obstructive Sleep Apnea Follow Up Visit:   Adherence to therapy documented at least annually?: Yes    Reassessment of excessive daytime sleepiness?: Yes    Weight / body mass index assessment at visit?: Yes    If body mass index greater than 25 was discussion of weight management discussed? : Yes    Blood pressure assessed at visit? : Yes          Medical Decision Making:   The following items were considered in medical decision making:   Review / order other diagnostic tests/interventions   Risks & Benefits:   Risks, benefits and treatment options discussed with patient.

## 2017-03-08 NOTE — Unmapped (Signed)
Spoke to the patient and confirmed appt for 03/09/17.

## 2017-03-09 ENCOUNTER — Ambulatory Visit: Admit: 2017-03-10 | Payer: MEDICARE | Attending: Critical Care Medicine

## 2017-03-09 DIAGNOSIS — G4733 Obstructive sleep apnea (adult) (pediatric): Secondary | ICD-10-CM

## 2017-03-09 NOTE — Progress Notes (Signed)
NPSG  Study completed and scored.. 11/292018

## 2017-03-14 NOTE — Unmapped (Signed)
Spoke with patient scheduled for 12/6.

## 2017-03-14 NOTE — Telephone Encounter (Signed)
Pt has follow up appointment in March to discuss results.  Does not want machine until after results received.  Please phone [phone].

## 2017-03-14 NOTE — Telephone Encounter (Signed)
Patient not home will be back in about half hour.

## 2017-03-15 MED ORDER — LEVOTHYROXINE SODIUM 100 MCG PO TABS
100 MCG | ORAL_TABLET | ORAL | 1 refills | Status: DC
Start: 2017-03-15 — End: 2017-12-21

## 2017-03-15 MED ORDER — METFORMIN HCL 1000 MG PO TABS
1000 MG | ORAL_TABLET | ORAL | 1 refills | Status: DC
Start: 2017-03-15 — End: 2017-12-21

## 2017-03-15 MED ORDER — JANUVIA 100 MG PO TABS
100 MG | ORAL_TABLET | Freq: Every day | ORAL | 1 refills | Status: DC
Start: 2017-03-15 — End: 2017-12-21

## 2017-03-15 NOTE — Telephone Encounter (Signed)
Medication:   Requested Prescriptions     Pending Prescriptions Disp Refills   . metFORMIN (GLUCOPHAGE) 1000 MG tablet [Pharmacy Med Name: MetFORMIN 1000MG  TABLET] 180 tablet      Sig: TAKE 1 TABLET BY MOUTH  TWICE A DAY WITH MEALS   . JANUVIA 100 MG tablet [Pharmacy Med Name: JANUVIA  100MG   TAB] 90 tablet      Sig: TAKE 1 TABLET BY MOUTH  DAILY   . levothyroxine (SYNTHROID) 100 MCG tablet [Pharmacy Med Name: LEVOTHYROXINE  0.1MG   TAB] 90 tablet      Sig: TAKE 1 TABLET BY MOUTH  DAILY       Last Filled:      Patient Phone Number: [phone] (home) [phone] (work)    Last appt: Visit date not found 07-05-2018Next appt: Visit date not found    Last Labs DM:   Lab Results   Component Value Date    LABA1C 8.2 10/13/2016       Last OARRS: No flowsheet data found.    Preferred Pharmacy:   [facility] [place] - [address] [place] [phone] - F [phone]  [address] [place]  [address]  [place] [id number]  Phone: [phone] Fax: [phone]    [facility] [place] [address] [place] - [id number] - P [phone] - F [phone]  [address]  [place] [id number]  Phone: [phone]: [phone]

## 2017-03-16 ENCOUNTER — Ambulatory Visit: Admit: 2017-03-16 | Discharge: 2017-03-16 | Payer: MEDICARE | Attending: Family

## 2017-03-16 DIAGNOSIS — G4733 Obstructive sleep apnea (adult) (pediatric): Secondary | ICD-10-CM

## 2017-03-16 NOTE — Progress Notes (Signed)
~~~~~~~~~~~~~~~~~~~~~~~~~~~~~~~~~~~~~~~~~~~~~~~~~~~  Referring Provider: Dr Newman Pies    Chief Complaint: Follow up OSA on CPAP yearly follow up    Date of NPSG: 05/27/2003   SE %: 63.5   AHI: 52   Lowest p ox %: 73   NPSG performed at: [facility]     Date of CPAP: 12/06/2008   SE %: 76.5   AHI: 1.1   Lowest p ox %: 90.0   CPAP performed at: [facility] @ time of study: 265 lbs  DME Company: [employer]  Mask type: full face  cmH2O: 13     PAP TITRATION REPORT        Patient:  [name], [name] Date: 03/09/2017   DOB: [birth year] PSG Study #: [id number]   Age:  76 year Referring Physician: [name] [phone])   Sex:  Male PSG Tech: LADEAN NAPIER, RPSGT   Height(in.): 6' Scored by: Joie Bimler, RPSGT   Weight(lbs): 257.0 lbs Ordering Provider: Lieutenant Diego ([id number])   BMI: 35.2 Neck Size: -       Impressions:  1. Persistent central apneas with an index greater than 18 per hour on fullface mask BiPAP.  2. Improvement in sleep architecture and oxygenation with full facemask ASV configured as follows: EPAP 6 cm of water, pressure support minimum 0 cm of water, pressure support maximum 20 cm water, maximum machine pressure 25 cm water.  3. Exclusively supine sleep observed during this study.  4. Reduced sleep efficiency.    Methodology:  This was an attended polysomnography examination performed by a registered polysomnography technician.  An XL Tech acquisition unit was utilized. Data channels included right and left EOG, submental electromyogram, six electroencephalogram channels (including bilateral central, frontal and occipital leads), electrocardiogram, PTAF and/or thermistor, thoracic and abdominal respiratory impedance plethysmography, continuous pulse oximetry, and bilateral tibial leads. Sleep staging, arousal identification, respiratory event identification, leg movement identification was performed consistent with the American Association of sleep medicine guidelines. Hypopneas were defined per Medicare requirements  requiring a 4% or greater oxyhemoglobin desaturation, or if appropriate, the alternative hypopnea rule. Digital video was recorded and was synchronized to the electroencephalogram.  Data interpretation:  This study was reviewed on an epoch by epoch basis in its entirety by a board certified (American Association of Sleep Professionals and Special educational needs teacher) physician.  A polysomnography examination with BiPAP/ASV titration was performed.  This study included all supine sleep.  Sleep architecture was notable for increased wakefulness after sleep onset, reduced sleep efficiency and improved sleep quality on ASV therapy.  Pap was titrated with a ResMed F 20 fullface mask.  BiPAP pressures included 11-14/6.  Monitoring time at 13/6 cm water BiPAP pressure was quite short period ASV pressures were as follows:  EPAP minimum 6 cm water, EPAP maximum 16 cm water, pressure support minimum 0 cm of water, pressure support maximum 20 cm water, maximum machine pressure 25 cm water, backup rate = auto .  With respect to the BiPAP portion of the study REM sleep was captured at 13-14 centimeters of water.  Mild to moderate oxyhemoglobin desaturations occurred during REM sleep at pressures tested.  Respiratory events and effort were erratic.  Sleep was fragmented.  The central apnea index was greater than 18 per hour on BiPAP.  ASV was then introduced because of the recurrent central events.  EPAP was left in an auto titrating mode but on review of the pressure data never exceeded 7 cm water.  Respiratory effort modulation was present throughout the ASV  portion of the study.  Backup rate engaged on a modest number of occasions.  Overall sleep quality/architecture was better on ASV treatment utilizing the full facemask.  Baseline oxyhemoglobin saturation was normal at 96 percent.  The oxyhemoglobin saturation nadir for this study was 81 percent.  Cardiac rhythm was regular.  Occasional atrial and ventricular  topics were observed.  Periodic limb movements during sleep occurred occasionally but produced no significant independent sleep fragmentation.  No sleep-related seizure activity was identified.                     Georgian Co, M.D.        HPI: Richard Overbey Johnsonis a 76 y.o. male with OSA on CPAP had a elevated AHI and looked like potentially having central sleep apnea with cheyenne stokes breathing. 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 This resolved on ASV. Overall sleep quality/architecture was better on ASV treatment utilizing the full facemask.  Baseline oxyhemoglobin saturation was normal at 96 percent.  The oxyhemoglobin saturation nadir for this study was 81 percent.  Cardiac rhythm was regular.  Occasional atrial and ventricular topics were observed.  Periodic limb movements during sleep occurred occasionally but produced no significant independent sleep fragmentation.  No sleep-related seizure activity was identified.    Please ask these questions  Any chronic or recent headaches? no  Any nausea, vomiting?no  numbness in the arms? no  Balance problems? no  And cardiac problems? Hx of afib and CHF back in 2017 EF was good     No other changes in his medical, surgical, social or family history or any hospitalizations or changes in medications since the last visit    Previous UPPP, oral venting and likes his FF mask       HTN, DM, atrial fib - corrected with mini-maze procedure) GERD, dylipidemia      DEVICE/ MASK/ DME INFORMATION  DME: medical services  Mask type: Airfit F10 large FF mask    Compliance Data Download: 01/18/2017-02/16/2017  Device Model: RESMED S10  Mask: airfit F10 med  PAP Pressure: min 10 max 18 cm H2O average pressure 17.8 mg  Percent Days with Device Usage: 100 %  Percent of Days with usage >= 4 hours: 100 %  Average Usage (number of hours used all days): 8 hrs 12 min  AHI: 12.1  Leak 11.5 average      EPWORTH  SLEEPINESS SCALE: (at or above 10 is abnormal and 24 is the maximum score)  Epworth Sleepiness Scale 04/14/2011 02/22/2016 02/21/2017 03/16/2017   Sitting and reading 2 1 1  0   Watching TV 3 2 2 1    Sitting, inactive in a public place (e.g. a theatre or a meeting) 1 0 0 1   As a passenger in a car for an hour without a break 2 0 1 0   Lying down to rest in the afternoon when circumstances permit 3 2 2 1    Sitting and talking to someone 0 0 0 0   Sitting quietly after a lunch without alcohol 1 1 0 0   In a car, while stopped for a few minutes in traffic 0 0 0 0   Total score 12 6 6 3        Sleep Schedule  Bedtime is 12a. Falls asleep within 15 minutes, with 1 nighttime awakenings for bathroom. Falls back asleep in 10 minutes. Sleeps on own  bed. Awakens at 7a during the week and same on the weekends.   Patient thinks they get approximately 6 hours of sleep a night.      Patient does takes naps 1 per week for 30 min    Past Medical Hx:   Past Medical History:   Diagnosis Date    Atrial fibrillation (CMS Dx)     corrected with Mini-maze procedure    Cataract     left eye    Diabetes mellitus (CMS Dx)     type 2    Dilated cardiomyopathy (CMS Dx)     Hearing loss     left ear    Hypercholesteremia     Hypertension     Sleep apnea     Thyroid disease     hypothyroidism     SurgHx:   Past Surgical History:   Procedure Laterality Date    COLONOSCOPY W/ POLYPECTOMY      benign  has at 3 plus procedures    HERNIA REPAIR  9/09    right inguinal, mini maze    HERNIA REPAIR  2/10    herniated lungs bilateral (repaired with mesh)    mini-maze      corrected his A-fib    SEPTOPLASTY      UVULOPALATOPHARYNGOPLASTY      UVULOPALATOPHARYNGOPLASTY      has had esophageal dilations in the past (Dr.Decter)    WISDOM TOOTH EXTRACTION       Problem List:  Patient Active Problem List   Diagnosis    Other, multiple, and unspecified sites, insect bite, nonvenomous, without mention of infection(919.4)    Hemangioma of  intra-abdominal structures    Other specified disorders of liver    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)    Disorder of esophagus    Chest pain    Contact dermatitis and other eczema, due to unspecified cause    Morbid obesity (CMS Dx)    Obstructive sleep apnea    Hypothyroidism    Type II or unspecified type diabetes mellitus without mention of complication, not stated as uncontrolled (CMS Dx)    Other and unspecified hyperlipidemia    Essential hypertension    Atrial fibrillation (CMS Dx)    Congestive heart failure (CMS Dx)    Hemorrhoids    Esophageal reflux    Pain in limb    Special screening for malignant neoplasm of prostate    Multiple rib fractures    OSA (obstructive sleep apnea)     CURRENT MEDICATIONS:   Current Outpatient Prescriptions   Medication Sig    acetaminophen Take 2 tablets (1,000 mg total) by mouth 3 times a day. (Patient taking differently: Take 1,000 mg by mouth 2 times a day.          )    apixaban Take 5 mg by mouth 2 times a day.    aspirin Take 81 mg by mouth daily.    atorvastatin Take 10 mg by mouth at bedtime.     carvedilol Take 25 mg by mouth 2 (two) times daily.      glimepiride Take 1 tablet by mouth  every morning before  breakfast    GLUC/CHON-MSM#1/C/MANG/BOS/BOR (OSTEO BI-FLEX ORAL) Take by mouth. OSTEO BI-FLEX JOINT SHIELD TABS     glyBURIDE-metformin Take 2 tablets by mouth 2 times a day.     levothyroxine Take 100 mcg by mouth every morning.     omeprazole Take 40 mg by mouth  every morning.     propafenone Take 225 mg by mouth 3 times a day.    SAW PALMETTO XTR/ZINC PICOLIN (SAW PALMETTO EXTRACT ORAL) Take 2 tablets by mouth 2 times a day.     valsartan-hydrochlorothiazide Take 1 tablet by mouth every morning.     chorionic gonadotropin, human Inject 10,000 Units into the muscle once.    polyethylene glycol Take 17 g by mouth 2 times a day as needed (for constipation).    senna-docusate Take 1 tablet by mouth at bedtime.      No current facility-administered medications for this visit.       Allergies  Allergies as of 03/16/2017    (No Known Allergies)     Family Hx:   Family History   Problem Relation Age of Onset    Diabetes Mother         type 2    Hypertension Mother     Coronary artery disease Sister     Diabetes Sister         type 2    Coronary artery disease Sister     Coronary artery disease Brother     Hypertension Brother     Diabetes Brother         type 2    Hypertension Brother      Social Hx:   Patient  reports that he has never smoked. He has never used smokeless tobacco. He reports that he does not drink alcohol or use drugs.  Please see attached form for further details.     Review of Systems  General ROS: waking up at night  Ophthalmic ROS: negative  Ears, nose, mouth, throat, and face ROS: negative  Respiratory ROS: all negative  Cardiovascular ROS: negative  Gastrointestinal ROS: negative  Genitourinary WRU:EAVWUJWJ 1 x per night  Integument/breast ROS: negative  Hematologic/lymphatic ROS: negative  Musculoskeletal XBJ:YNWGNFAO  Neurological ROS: negative  Psychological ROS: all negative  Endocrine ROS: all  negative  Allergy and Immunology ROS: negative    Denies: suicidal ideas, irritability, depression, racing mind, changes in appetite, fainting spells, alcoholism, tremors, using illegal drugs, worrying about inability to sleep, insomnia, chest pain and shortness of breath,    I have reviewed the review of system and advised patient to contact primary care physician or specialist for non sleep related issues.    Previous Report(s) Reviewed: historical medical records, lab reports, office notes      The following portions of the patient's history were reviewed and updated as appropriate: allergies, current medications, past family history, past medical history, past social history, past surgical history and problem list.    OBJECTIVE :   BP 144/72   Pulse 78   Resp 16   Ht 6' 0.01 (1.829 m)   Wt  (!) 260 lb 9.6 oz (118.2 kg)   SpO2 98%   BMI 35.34 kg/m    Wt Readings from Last 3 Encounters:   03/16/17 (!) 260 lb 9.6 oz (118.2 kg)   02/21/17 (!) 257 lb 9.6 oz (116.8 kg)   02/22/16 (!) 263 lb (119.3 kg)       Physical Exam   Constitutional: alert and in no apparent distress. Well-developed and well-nourished.   Head: normocephalic and atraumatic.   Ears: appear normal externally bilaterally  Eyes: Conjunctivae are normal. Pupils are equal, round, no scleral icterus, drainage or redness.   Nose: Columella normal. Positive for nasal congestion. No deviated septum.   Mouth : Mallampati 4. Crowded airway.  Oropharynx is clear, moist and pink. UPPP,  Macroglossia w/ ridging. Dentition poor missing teeth.  Neck:  grossly normal range of motion.  Trachea midline.    Cardiovascular: normal rate and regular rhythm. Neg murmur. No peripheral edema   Pulmonary/Chest: effort normal and breath sounds normal. No stridor. No respiratory distress, no wheezes, no rales.   Abdominal: deferred   Musculoskeletal: Grossly normal range of motion and strength. Normal gait.   Neurological:  alert. Cranial nerves II-XII grossly intact. Strength 5/5 in upper extremities bilaterally. Normal gait.  No tremor.   Skin: skin is warm and dry. No rash noted. Multiple healing skin lesions on arms and face  Psychiatric: normal speech, normal mood and affect, behavior is normal. Judgment and thought content normal.     Labs Reviewed:   No results found for: IRON  Lab Results   Component Value Date    WBC 8.7 02/07/2013    HGB 13.0 (L) 02/07/2013    HCT 38.7 02/07/2013    MCV 84.7 02/07/2013    PLT 219 02/07/2013     Lab Results   Component Value Date    GLUCOSE 146 (H) 05/12/2015    BUN 22 05/12/2015    CO2 29 05/12/2015    CREATININE 1.12 05/12/2015    K 4.2 05/12/2015    NA 136 05/12/2015    CL 99 05/12/2015    CALCIUM 9.5 05/12/2015     Lab Results   Component Value Date    HGBA1C 7.2 (H) 04/15/2008     Lab Results   Component Value Date     TSH 1.50 04/15/2008    FREET4 1.6 04/15/2008     Transthoracic Echocardiography    Patient:  Arkeem, Gima  MR #:    [id number]  Account:  Study Date: 02/17/2016  Gender:   M  Age:    74  DOB:    12/04/1940  Room:    [facility]    ORDERING  Eda Keys, MD  OPERATOR  Mardee Postin RVT  PERFORMING [facility], [place]    --------------------------------------------------------------------    Procedure:ECHO 2D    Order:ECHO 2D    Accession  COMPLETE (TTE)      COMPLETE (TTE)    Number:[id number]    --------------------------------------------------------------------  Indications:   Atrial fibrillation - paroxysmal 427.31. PACs  427.61.    --------------------------------------------------------------------  PMH:  Atrial fibrillation. Risk factors: Hypertension. Diabetes  mellitus. Dyslipidemia.    --------------------------------------------------------------------  Study data:  Study status: Routine. Procedure: Transthoracic  echocardiography. Image quality was technically difficult. Scanning  was performed from the parasternal, apical, and subcostal acoustic  windows.     Transthoracic echocardiography. M-mode, complete  2D, complete spectral Doppler, and color Doppler. Birthdate:  Patient birthdate: 08-15-1940. Age: Patient is 76yr old. Sex:  Gender: male. Height: Height: 72in. Height: 72in. Weight:  Weight: 259.5lb. Weight: 259.4lb. Body mass index: BMI:  35.3kg/m^2. Body surface area:  BSA: 2.83m^2. Blood pressure:  151/91 Patient status: Outpatient. Study date: Study date:  02/17/2016. Study time: 09:48 AM. Location: Echo laboratory.    --------------------------------------------------------------------  Study Conclusions    - Left ventricle: The cavity size was normal. Wall thickness was  normal. Systolic function was normal. The estimated ejection  fraction was in the range of 60% to 65%. Wall motion was  normal;  there were no regional wall motion abnormalities.  - Mitral valve: Mild regurgitation.  - Left atrium: The atrium was mildly dilated.  - Right ventricle: Systolic function was normal.  ASSESSMENT  Giddeon Bessinger Johnsonis a 76 y.o. male   OSA on CPAP had a elevated AHI and looked like potentially having central sleep apnea with cheyenne stokes breathing. 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 This resolved on ASV. Overall sleep quality/architecture was better on ASV treatment utilizing the full facemask.  Baseline oxyhemoglobin saturation was normal at 96 percent.  The oxyhemoglobin saturation nadir for this study was 81 percent.  Cardiac rhythm was regular.  Occasional atrial and ventricular topics were observed.  Periodic limb movements during sleep occurred occasionally but produced no significant independent sleep fragmentation.  No sleep-related seizure activity was identified.    Previous visit- OSA on CPAP having trouble with high pressure and elevated AHI 12.1/hr. 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 He has excellent compliance 100% despite the high pressure.  BMI 35    HTN, DM, atrial fib - corrected with mini-maze procedure) GERD, dylipidemia  UPPP- oral venting and now using FF mask    PLAN:   1. OSA on CPAP  Reviewed titration study with BIPAP and ASV   Reviewed central sleep apnea and cheyenne stokes breathing  ECHO 2017 EF 60-65% ordered a ECHO once the ECHO results are back I will order ASV  Having no symptoms to order a brain MRI     CPAP pressure  min 10 max 20 cmH2O        DME : Medical services      Patient was educated regarding OSAHS pathophysiology, relationship with cardiovascular, cerebrovascular and metabolic morbidity and mortality, issues related to PAP adherence, its use, cleaning and maintenance.  if any issues were to arise before their next office visit they may follow up  sooner or contact their DME company for assistance.  Patient has been advised that anything preventing them from using the device on a daily basis is a concern that should be relayed to [place] their sleep provider. Patient verbalized understanding and agrees to comply.     Exercise and weight loss encouraged    The patient was educated on the dangers of drowsy driving; advised to avoid long distance or night driving. Instructed to take a short nap on the side of the road if sleepy when driving. Tips to avoid drowsy driving were provided to patient.     Sleep hygiene techniques were provided to patient.     Return to clinic  2-3 months    AMB SLEEP QUALITY MEASURES    Medical Decision Making:   The following items were considered in medical decision making:   Review / order other diagnostic tests/interventions   Risks & Benefits:   Risks, benefits and treatment options discussed with patient.

## 2017-03-16 NOTE — Unmapped (Addendum)
Sleep Apnea   Sleep apnea is a sleep disorder characterized by abnormal pauses in breathing while you sleep. When your breathing pauses, the level of oxygen in your blood decreases. This causes you to move out of deep sleep and into light sleep. As a result, your quality of sleep is poor, and the system that carries your blood throughout your body (cardiovascular system) experiences stress. If sleep apnea remains untreated, the following conditions can develop:  ?? High blood pressure (hypertension).  ?? Coronary artery disease.  ?? Inability to achieve or maintain an erection (impotence).  ?? Impairment of your thought process (cognitive dysfunction).  There are three types of sleep apnea:  1. Obstructive sleep apnea Pauses in breathing during sleep because of a blocked airway.  2. Central sleep apnea Pauses in breathing during sleep because the area of the brain that controls your breathing does not send the correct signals to the muscles that control breathing.  3. Mixed sleep apnea A combination of both obstructive and central sleep apnea.  RISK FACTORS  The following risk factors can increase your risk of developing sleep apnea:  ?? Being overweight.  ?? Smoking.  ?? Having narrow passages in your nose and throat.  ?? Being of older age.  ?? Being male.  ?? Alcohol use.  ?? Sedative and tranquilizer use.  ?? Ethnicity. Among individuals younger than 35 years, African Americans are at increased risk of sleep apnea.  SYMPTOMS   ?? Difficulty staying asleep.  ?? Daytime sleepiness and fatigue.  ?? Loss of energy.  ?? Irritability.  ?? Loud, heavy snoring.  ?? Morning headaches.  ?? Trouble concentrating.  ?? Forgetfulness.  ?? Decreased interest in sex.  DIAGNOSIS   In order to diagnose sleep apnea, your caregiver will perform a physical examination. Your caregiver may suggest that you take a home sleep test. Your caregiver may also recommend that you spend the night in a sleep lab. In the sleep lab, several monitors record  information about your heart, lungs, and brain while you sleep. Your leg and arm movements and blood oxygen level are also recorded.  TREATMENT  The following actions may help to resolve mild sleep apnea:  ?? Sleeping on your side. ??  ?? Using a decongestant if you have nasal congestion. ??  ?? Avoiding the use of depressants, including alcohol, sedatives, and narcotics. ??  ?? Losing weight and modifying your diet if you are overweight.  There also are devices and treatments to help open your airway:  ?? Oral appliances. These are custom-made mouthpieces that shift your lower jaw forward and slightly open your bite. This opens your airway.  ?? Devices that create positive airway pressure. This positive pressure splints your airway open to help you breathe better during sleep. The following devices create positive airway pressure:  ?? Continuous positive airway pressure (CPAP) device. The CPAP device creates a continuous level of air pressure with an air pump. The air is delivered to your airway through a mask while you sleep. This continuous pressure keeps your airway open.  ?? Nasal expiratory positive airway pressure (EPAP) device. The EPAP device creates positive air pressure as you exhale. The device consists of single-use valves, which are inserted into each nostril and held in place by adhesive. The valves create very little resistance when you inhale but create much more resistance when you exhale. That increased resistance creates the positive airway pressure. This positive pressure while you exhale keeps your airway open, making it easier  to breath when you inhale again.  ?? Bilevel positive airway pressure (BPAP) device. The BPAP device is used mainly in patients with central sleep apnea. This device is similar to the CPAP device because it also uses an air pump to deliver continuous air pressure through a mask. However, with the BPAP machine, the pressure is set at two different levels. The pressure when you  exhale is lower than the pressure when you inhale.  ?? Surgery. Typically, surgery is only done if you cannot comply with less invasive treatments or if the less invasive treatments do not improve your condition. Surgery involves removing excess tissue in your airway to create a wider passage way.  Document Released: 03/18/2002 Document Revised: 09/27/2011 Document Reviewed: 08/04/2011  ExitCare?? Patient Information ??2014 [place], [place].      PREPARING TO USE POSITIVE AIRWAY PRESSURE DEVICE AT HOME    Setting up your equipment (To be used at all times when sleeping, including naps)  ?? Place your machine on a hard, level surface close to where you sleep. Plug your machine into a grounded outlet. If you need to use an extension cord, we suggest you use a surge protector.   ?? Connect your hose to the airflow outlet and connect the other end of the hose to your mask.   ?? Using distilled water only, fill the water chamber. Do NOT fill above the maximum line.   ?? Replace the distilled water daily, as bacteria may start to grow.   ?? If you have nasal dryness or a dry mouth, increase the humidity level one increment at a time.  ?? If your sleeping environment is cool, you can place the PAP hose under your covers to prevent condensation in the tubing.   ?? INSTRUCTIONS ON HOW TO PUT ON YOUR MASK: Place your mask on your face loosely. Most patients make the mistake of putting their masks on too tight.    o Turn on your machine. Lay back on your pillow to adjust your mask, as it will probably fit differently when you sit up vs. lying down. Remember, start very loose, and tighten as needed. A loose mask is much better than a tight mask. You will feel some air from the exhalation port on the mask. This is normal.   ?? Please note that if you are given a mask during your overnight sleep study or by your DME (medical supply company) you have 30 days to make any changes or exchanges. After the 30-day period, you may not be able to  return the mask or make changes for up to 3 months or longer depending on your insurance. If you have any questions please contact your DME (medical supply company).      We hope you do well adjusting to using your PAP device. PAP therapy is the gold standard treatment for Obstructive Sleep Apnea.     Few features we would like to highlight on your PAP device.       The first thing we want to point out is the RAMP feature. If you need temporary relief from the full therapy pressure of your machine, you can press the ramp button.  Respironics device: Press the elongated triangle button once.   F&P device: Press and hold the large dial for 3 seconds.    ResMed device: Ramp feature will automatically activate when you place the mask on your face (this is a built in feature). To reactivate it just pull the mask away from  you face and reposition it. Ramp feature will reactivate.     The Ramp feature temporarily allows you to reduce the pressure on your machine. Your machine can be set to ramp up within 5-45 minutes to your full therapy pressure. If you are not asleep by the time your machine reaches full pressure, and you need relief, you can re-start the ramping process by pressing the button again. You can use the ramp feature as many times and as often as you like. This is a useful feature if you use the restroom in the middle of the night.     The next thing we would like to point out is the HUMIDIFICATION feature on your PAP device. Most machines are pre-set for a humidity setting of 3 or 4. You can manually change the humidifier settings on your machine when you turn the machine on. Increase the humidity level one increment at a time.  Respironics device: Turn the on/off button to the left for less humidification or the right for more humidification. Humidification ranges from 1-5. If you have a heated hose talk to your provider or DME who can teach you how to make adjustments.     F&P device: Turn the large dial  to the right for higher level and left for lower level. Humidification ranges from 1-7.     ResMed S9 device: Attach the humidifier component to the main unit, turn   the dial and choose the water droplet icon; press the button once. Then turn the dial to adjust your humidity setting. Press button a final time to save the setting. Humidification ranges from 1-8        Resmed S10 Device - This device has an Auto humidification feature.    If you have a heated hose it defaults to Auto Setting. You can change it to           manual setting under My Options. Switch Climate Control from Auto to            Manual. Then you will see two other options pop up. Tube Temperature and Humidity Level. Tube temperature is like your thermostat how hot or cold you want the air to be. It can range from 60-86 degree (most defaults to 81 degrees). Humidity Level is the next option this is the Moisture that is captured from the distilled water placed in the chamber. It goes from 1-8. Higher the # more moister. SEE THIS VIDEO https://www.youtube.com/watch?v=RzCskdfN46k&sns=em         For example, if you are waking up with a dry mouth, you should turn your humidifier up one number at a time. If you are getting excess condensation (water droplets) in your mask or tubing, you should turn the humidifier down one number at a time. In summer you need less humidity in winter you need more humidity. THIS NUMBER RARELY STAYS THE SAME ALL YEAR LONG. YOU HAVE TO CHANGE IT DEPENDING ON HOW YOU FEEL AND WHAT THE WEATHER IS OUTSIDE.     A prescription will be sent to a DME (medical supply company) covered by your insurance. The DME Company should contact you within 7-10 business days once they receive the prescription. If you have not received a call from the DME Company regarding your device, PLEASE CALL US so we may investigate further.     Once set up with your PAP equipment, please call our office and set up an appointment with your sleep  provider. At that visit,   we will obtain a download from your machine, address any issues with the mask or device, review cleaning instructions, replacement supply schedule and answer any questions you may have.     After receiving your PAP device please remember you need to bring all your equipment to EVERY office visit including your machine, tubing, mask and power cord. Make sure you do not travel with water in the humidifier (always empty it every morning). A thorough check of all your equipment will take place at each appointment.          CLEANING INSTRUCTIONS FOR PAP EQUIPMENT     Keeping your equipment and supplies clean is very important.     REMINDER: Only use DISTILLED WATER in your humidifier, Empty water daily.    DAILY  Mask and tubing:   ?? Wash your face before applying mask  ?? Wash mask and tubing with baby shampoo and warm water.     Humidifier:   ?? Empty water in reservoir  ?? Clean with baby shampoo and warm water  ?? Rinse, then air dry    WEEKLY  Mask and tubing:   ?? Soak your mask and tubing in 1 part vinegar and 3 parts water for 30 minutes. Rinse, and allow to air dry.     Humidifier:   ?? Wash with warm water and baby shampoo  ?? Soak in 1 part vinegar and 3 parts water for 30 minutes  ?? Rinse with warm water and allow to air dry    Machine Exterior:   ?? Wipe with a clean damp cloth    MONTHLY AND/OR AS NEEDED  ?? Reusable foam filters (black filter)- wash in warm water with baby shampoo. Rinse well and dry with paper towel  ?? Disposable felt filter (white filter)- Replace filter every two weeks to once a month    NOTE: If you are having repeated sinus and /or respiratory infections, dirty equipment may be the cause. It may help to clean and disinfect your equipment more frequently    TRAVELING  Always make sure the humidifier is empty when traveling. (including doctor appointments, air travel or long distance driving).  When flying, always carry your PAP device with you as a carry on item, NEVER  check it in as baggage. We can provide you with a letter stating it is a medical device, talk to your provider.    Always make sure you have your mask and tubing with you. You will need appropriate plug adapters when traveling outside the [place].    If travelling by air and you are unable to carry distilled water with youuse bottled water (no more than 2 weeks). DO NOT USE TAP WATER.     Equipment Replacement Schedule    To get the most benefit from your PAP therapy, your equipment should be replaced when necessary based on wear and tear. For example, your mask may need to be replaced if you notice it is cracked or the seal is leaking. If your tubing is torn, it needs to be replaced.   If you equipment is showing signs of wear, you may be entitled to replace it. The replacement schedule for Medicare patients is shown below. If you are NOT a Medicare patient, please check with your DME (Durable Medical Supply) provider for your individual insurance policy???s replacement schedule.     Supplies   Medicare Medicaid My Insurance Plan  Mask    1 per 3 m 1 per year _______________    Nasal cushion  2 per m 1 per 6 m _______________  Pillows cushion  2 per m 1per 6 m _______________  Full-face cushion  1 per m 1per 6 m _______________  Headgear   1 per 6 m 1 per year _______________  Water Chamber  1 per 6 m N/A  _______________  Chinstrap   1 per 6 m 1 per 6 m       _______________  Tubing/Hose   1 per 3 m 1 per year _______________  Heater wire tubing  1 per 3 m N/A  _______________  Filter, disposable (white) 2 per m 1 per m _______________  Filter, particle foam (black) 1 per 6 m 4 per year _______________  Therapy device  1 per 5 yrs  ?  _______________    Follow up and compliance goals for Medicare and Medicaid patients   (most private insurances are now following similar rules)  Medicare and Medicaid require patients to follow-up with their provider after they are set up with their PAP equipment. The follow up  appointment should be within 31-90 days after receiving their equipment. If patient does not follow up as directed there might be issues with the cost of the device being covered by the insurance. Therefore please ensure you make a follow up appointment with your sleep provider.      Insurance also requires that the patient meet some compliance goals such as the download should indicate patient has been wearing the device for at least 4 hours per night AND they are using the device for 30 nights in a row.     Key points to remember  ?? You should wear the device every time you sleep, no matter when you sleep or where you sleep. When you nap or travel or go on vacation you need to use the machine when asleep.   ?? If you sleep without the device your Obstructive Sleep Apnea (OSA) is untreated and your risk of heart attacks and strokes are higher.   ?? Clean the device on a regularly basis to avoid sinus infections and pneumonias   ?? Never travel with water in the reservoir   ?? Replace supplies on a regularly basis (see schedule below). You may need to contact your Durable Medical Equipment company (DME) and request supplies (just like you would medications from a pharmacy).  ?? Alcohol will worsen OSA, Alcohol can decrease your drive to breathe, slowing your breathing and making your breaths shallow. In addition, it may relax the muscles of your throat, which may make it more likely for your upper airway to collapse. REMEMBER TO ALWAYS WEAR YOUR PAP DEVICE WHEN SLEEPING.   ?? Certain medications can also worsen OSA such as some pain medications, anti-anxiety medications, muscle relaxants, testosterone supplementation. Please inform your sleep provider which medications you take on a regular basis.   ?? All patients are advised to make follow up appointments with the provider after they are set up with their PAP device. Most insurance plans now require the patient set up a follow up appointment with the provider 31-90 days  after they have been set up with their device and at least once a year thereafter. Discuss these issues with your provider for more details.   ?? Please note when you are provided with a mask you have a manufacturer 30 day guarantee that comes with it. If you do not like that mask for any reason contact your DME and request an exchange. After 30   days you may not be eligible for a new mask for another 3-6 months depending on insurance. Talk to your DME or sleep provider for more details.   ??   ?? Nasal congestion  ?? If you are struggling with nasal congestion try to use a nasal sinus rinse available over the counter such as Netti Pot 1 hour before bedtime (you may have to use it daily for a 7-10 days then as needed). You can also try over the counter Flonase or Nasacort 1-2 sprays in each nostril at bedtime.   ?? If you ever have problems with the device or the plan discussed with your sleep provider has not come to fruition please contact your provider by phone or via MyChart. Thank you        Ordered ECHO before ordering ASV machine once back as long as the EF is above 45 can use ASV (adaptive servo ventilation).

## 2017-03-17 NOTE — Unmapped (Signed)
Message to [facility] advising orders in epic for mask for pap therapy AirFit F20 medium cushion.

## 2017-03-22 MED ORDER — LOSARTAN POTASSIUM-HCTZ 100-25 MG PO TABS
100-25 MG | ORAL_TABLET | Freq: Every day | ORAL | 1 refills | Status: DC
Start: 2017-03-22 — End: 2017-07-21

## 2017-03-22 NOTE — Telephone Encounter (Signed)
Medication:   Requested Prescriptions     Pending Prescriptions Disp Refills   . losartan-hydrochlorothiazide (HYZAAR) 100-25 MG per tablet [Pharmacy Med Name: LOSARTAN/HCTZ 100-25MG  TABLET] 90 tablet      Sig: TAKE 1 TABLET BY MOUTH  DAILY       Last Filled:  01/14/2017    Patient Phone Number: [phone] (home) [phone] (work)    Last appt: 10/13/2016   Next appt: 04/14/2017  Last BMP:   Lab Results   Component Value Date    NA 141 10/13/2016    K 4.7 10/13/2016    CL 101 10/13/2016    CO2 27 10/13/2016    ANIONGAP 13 10/13/2016    GLUCOSE 198 10/13/2016    GLUCOSE 274 03/08/2016    BUN 19 10/13/2016    CREATININE 1.1 10/13/2016    LABGLOM >60 10/13/2016    GFRAA >60 10/13/2016    GFRAA >60 08/15/2011    CALCIUM 9.1 10/13/2016      Last CMP:   Lab Results   Component Value Date    NA 141 10/13/2016    K 4.7 10/13/2016    CL 101 10/13/2016    CO2 27 10/13/2016    ANIONGAP 13 10/13/2016    GLUCOSE 198 10/13/2016    GLUCOSE 274 03/08/2016    BUN 19 10/13/2016    CREATININE 1.1 10/13/2016    LABGLOM >60 10/13/2016    GFRAA >60 10/13/2016    GFRAA >60 08/15/2011    PROT 6.6 10/13/2016    PROT 6.8 05/04/2011    LABALBU 4.3 10/13/2016    AGRATIO 1.9 10/13/2016    BILITOT 0.3 10/13/2016    ALKPHOS 105 10/13/2016    ALT 17 10/13/2016    AST 16 10/13/2016    GLOB 2.3 10/13/2016     Last Renal Function:   Lab Results   Component Value Date    NA 141 10/13/2016    K 4.7 10/13/2016    CL 101 10/13/2016    CO2 27 10/13/2016    GLUCOSE 198 10/13/2016    GLUCOSE 274 03/08/2016    BUN 19 10/13/2016    CREATININE 1.1 10/13/2016    LABALBU 4.3 10/13/2016    CALCIUM 9.1 10/13/2016    GFR >60 08/15/2011    GFRAA >60 10/13/2016    GFRAA >60 08/15/2011       Last OARRS: No flowsheet data found.    Preferred Pharmacy:   [facility] [place] [address] [place] [phone] - F [phone]  [address] [place]  [address]  [place] [id number]  Phone: [phone] Fax: [phone]    [facility] [address] - [place], [place] - [phone] - F [phone]  [address]  [place] [id number]  Phone: [phone] Fax: [phone]

## 2017-03-24 ENCOUNTER — Emergency Department: Admit: 2017-03-24 | Payer: MEDICARE

## 2017-03-24 ENCOUNTER — Inpatient Hospital Stay: Admit: 2017-03-24 | Discharge: 2017-03-24 | Disposition: A | Discharge status: Home / Self Care | Payer: MEDICARE

## 2017-03-24 DIAGNOSIS — S80211A Abrasion, right knee, initial encounter: Secondary | ICD-10-CM

## 2017-03-24 NOTE — Unmapped (Signed)
You were seen in the emergency department following a fall with shoulder pain.  Imaging of your shoulder showed no evidence of any broken bones.  You should follow up with a sports medicine clinic for further evaluation of your pain, including potential MRI to evaluate for a rotator cuff tear.  Please return to the emergency department with any new or worsening symptoms that are concerning to you.

## 2017-03-24 NOTE — Unmapped (Signed)
Family at bedside.

## 2017-03-24 NOTE — Unmapped (Signed)
Patient with complaints of tripping over lawn mower in shed yesterday and has continued pain in left shoulder and right knee.

## 2017-03-24 NOTE — Unmapped (Signed)
[place] ED Note    Date of Service: 03/24/2017    Reason for Visit: Fall      Patient History     HPI:  This is a 76 y.o. male with history of Atrial fibrillation status post mini maze procedure, cataracts, diabetes mellitus, dilated cardiomyopathy, hypertension, and hypercholesterolemia who presents with limb pain following a fall.  Patient states that he tripped over his lawnmower at approximately 1700 the evening prior to presentation, landing heavily on his left hand and right knee.  Patient endorses persistent aching pain of the left shoulder since that time with mild right knee soreness.  Patient denies tingling, numbness, or weakness of the left hand distally that is dissimilar from his baseline.  Patient denies syncopal symptoms, dizziness, or head trauma in the accident.  Patient states that he is still able to ambulate well, and denies tingling or numbness of the foot.    Past Medical History:   Diagnosis Date   ??? Atrial fibrillation (CMS Dx)     corrected with Mini-maze procedure   ??? Cataract     left eye   ??? Diabetes mellitus (CMS Dx)     type 2   ??? Dilated cardiomyopathy (CMS Dx)    ??? Hearing loss     left ear   ??? Hypercholesteremia    ??? Hypertension    ??? Sleep apnea    ??? Thyroid disease     hypothyroidism       Past Surgical History:   Procedure Laterality Date   ??? COLONOSCOPY W/ POLYPECTOMY      benign  has at 3 plus procedures   ??? HERNIA REPAIR  9/09    right inguinal, mini maze   ??? HERNIA REPAIR  2/10    herniated lungs bilateral (repaired with mesh)   ??? mini-maze      corrected his A-fib   ??? SEPTOPLASTY     ??? UVULOPALATOPHARYNGOPLASTY     ??? UVULOPALATOPHARYNGOPLASTY      has had esophageal dilations in the past (Dr.Decter)   ??? WISDOM TOOTH EXTRACTION         CASTULO SCARPELLI  reports that he has never smoked. He has never used smokeless tobacco. He reports that he does not drink alcohol or use drugs.    Discharge Medication List as of 03/24/2017   4:06 PM      CONTINUE these medications which have NOT CHANGED    Details   acetaminophen (TYLENOL) 500 MG tablet Take 2 tablets (1,000 mg total) by mouth 3 times a day., Starting 02/08/2013, Until Discontinued, Print      apixaban (ELIQUIS) 5 mg Tab Take 5 mg by mouth 2 times a day., Historical Med      aspirin 81 MG EC tablet Take 81 mg by mouth daily., Until Discontinued, Historical Med      atorvastatin (LIPITOR) 10 MG tablet Take 10 mg by mouth at bedtime. , Until Discontinued, Historical Med      carvedilol (COREG) 25 MG tablet Take 25 mg by mouth 2 (two) times daily.  , Starting 04/15/2008, Until Discontinued, Historical Med      chorionic gonadotropin, human 10,000 unit injection Inject 10,000 Units into the muscle once., Historical Med      glimepiride (AMARYL) 2 MG tablet Take 1 tablet by mouth  every morning before  breakfast, Historical Med      GLUC/CHON-MSM#1/C/MANG/BOS/BOR (OSTEO BI-FLEX ORAL) Take by mouth. OSTEO BI-FLEX JOINT SHIELD TABS , Starting 11/28/2008, Until  Discontinued, Historical Med      glyBURIDE-metformin (GLUCOVANCE) 2.5-500 mg per tablet Take 2 tablets by mouth 2 times a day. , Starting 04/16/2008, Until Discontinued, Historical Med      levothyroxine (SYNTHROID, LEVOTHROID) 100 MCG tablet Take 100 mcg by mouth every morning. , Starting 03/14/2006, Until Discontinued, Historical Med      omeprazole (PRILOSEC) 40 MG capsule Take 40 mg by mouth every morning. , Starting 04/15/2008, Until Discontinued, Historical Med      polyethylene glycol (GLYCOLAX) 17 gram/dose powder Take 17 g by mouth 2 times a day as needed (for constipation)., Starting 03/20/2015, Until Discontinued, Print      propafenone (RYTHMOL) 225 MG tablet Take 225 mg by mouth 3 times a day., Historical Med      SAW PALMETTO XTR/ZINC PICOLIN (SAW PALMETTO EXTRACT ORAL) Take 2 tablets by mouth 2 times a day. , Starting 03/13/2006, Until Discontinued, Historical Med      senna-docusate (SENNA-S) 8.6-50 mg per tablet Take 1 tablet by  mouth at bedtime., Starting 03/20/2015, Until Discontinued, Print      valsartan-hydrochlorothiazide (DIOVAN-HCT) 320-25 mg per tablet Take 1 tablet by mouth every morning. , Starting 10/08/2007, Until Discontinued, Historical Med             Allergies:   Allergies as of 03/24/2017   ??? (No Known Allergies)       PMH: Nursing notes reviewed   PSH: Nursing notes reviewed   FH: Nursing notes reviewed   MEDS: Nursing notes and chart reviewed     Review of Systems     ROS: A full, ten-point review of systems was performed. Notable findings per HPI. All other pertinent systems reviewed and negative.      Physical Exam     ED Triage Vitals [03/24/17 1124]   Vital Signs Group      Temp 98.2 ??F (36.8 ??C)      Temp Source Oral      Heart Rate 72      Heart Rate Source Monitor      Resp 17      SpO2 96 %      BP 138/77      MAP (mmHg) 96      BP Location Right arm      BP Method Automatic      Patient Position Sitting   SpO2 96 %   O2 Device None (Room air)       General:  Well-nourished, well-developed Elderly gentleman, in no apparent distress   HEENT:  Normocephalic, atraumatic; extraocular movements intact; moist mucous membranes  Neck:  Neck supple, trachea midline  Musculoskeletal:  No obvious deformity of the left arm; however, marked pain with abduction and external rotation of the left shoulder joint; normal passive and active range of motion of the right knee  Vascular:  2+ peripheral pulses in bilateral upper and lower extremities   Skin:  Multiple superficial abrasions of the right knee with no associated erythema   Neuro:  Alert and interactive, strength and sensation grossly intact  Psych:  Appropriate mood and affect       Diagnostic Studies     Labs:  Lab results reviewed - please see Epic for full details.    Radiology:  CT Shoulder Left WO contrast   Final Result   IMPRESSION:      No acute fracture or dislocation.   Irregular thickening of the joint capsule and inferior glenohumeral ligament. Recommend  correlation with any signs  or symptoms of adhesive capsulitis.   Moderate AC and glenohumeral arthrosis.      I have personally reviewed the images and I agree with this report.      Report Verified by: Annett Fabian at 03/24/2017 3:24 PM EST      X-ray Knee Right 1 or 2-views   Final Result   IMPRESSION:      Left shoulder:      Questionable lucency with adjacent sclerotic margin along the inferomedial humeral head, which may be artifactual or related to osteophyte formation. Although less likely, in the setting of trauma, an age-indeterminate fracture is not excluded. Cross-sectional imaging could be considered to better evaluate.      Left wrist:      Questionable cortically based lucency at the base of the first distal phalanx, may represent a nondisplaced fracture or artifact. Correlate with point tenderness. If there are persistent clinical concerns dedicated radiographs of the hand may be beneficial.      Right knee:      No acute osseous abnormality.      Minimal anterior compartment osteoarthrosis.      Report Verified by: Particia Nearing, MD at 03/24/2017 1:26 PM EST      X-ray Shoulder Left min 2-views   Final Result   IMPRESSION:      Left shoulder:      Questionable lucency with adjacent sclerotic margin along the inferomedial humeral head, which may be artifactual or related to osteophyte formation. Although less likely, in the setting of trauma, an age-indeterminate fracture is not excluded. Cross-sectional imaging could be considered to better evaluate.      Left wrist:      Questionable cortically based lucency at the base of the first distal phalanx, may represent a nondisplaced fracture or artifact. Correlate with point tenderness. If there are persistent clinical concerns dedicated radiographs of the hand may be beneficial.      Right knee:      No acute osseous abnormality.      Minimal anterior compartment osteoarthrosis.      Report Verified by: Particia Nearing, MD at 03/24/2017 1:26 PM  EST      X-ray Wrist Left min 3-views   Final Result   IMPRESSION:      Left shoulder:      Questionable lucency with adjacent sclerotic margin along the inferomedial humeral head, which may be artifactual or related to osteophyte formation. Although less likely, in the setting of trauma, an age-indeterminate fracture is not excluded. Cross-sectional imaging could be considered to better evaluate.      Left wrist:      Questionable cortically based lucency at the base of the first distal phalanx, may represent a nondisplaced fracture or artifact. Correlate with point tenderness. If there are persistent clinical concerns dedicated radiographs of the hand may be beneficial.      Right knee:      No acute osseous abnormality.      Minimal anterior compartment osteoarthrosis.      Report Verified by: Particia Nearing, MD at 03/24/2017 1:26 PM EST          EKG:      Emergency Department Procedures       ED Course and MDM     JAKOBE BLAU is a 76 y.o. male with a history and presentation as described above in HPI.  The patient was evaluated by myself and the ED Attending Physician, Dr. Thelma Barge. All management and disposition plans were discussed  and agreed upon.    Falling a thorough examination, plain films of the left shoulder, left wrist, and right knee were obtained to assess for possible traumatic injury stemming from patient's recent fall.  Patient's diagnostic imaging was notable for questionable lucency with adjacent sclerotic margin along the inferomedial humeral head, fever to be artifactual but potentially suggestive of trauma or injury in addition to questionable lucency of the base of the 1st distal phalanx.  Patient adamantly denied hand pain, however, and no further imaging was pursued of the left hand or phalanges.  Axial imaging of the left shoulder showed no evidence of acute osseous abnormality but did note irregular thickening of the glenohumeral ligament and joint capsule.  These findings were  relayed to the patient, and I encouraged him to pursue outpatient follow-up with sports medicine for further evaluation and potential magnetic resonance imaging for better characterization.    Given patient's overall well-appearance in the context of reassuring vitals and diagnostic evaluation, the patient was deemed appropriate for discharge at this time.My customary discharge instructions, including strict return precautions for new or worsening symptoms concerning to the patient, were provided. Patient was instructed to follow-up with the sports medicine outpatient clinic for further evaluation and management.  All of patient's questions were answered satisfactorily, and he was subsequently sent home in stable condition.      Impression     1. Fall, initial encounter    2. Acute pain of left shoulder           Marjean Donna, M.D., PGY-3   [facility]     Marjean Donna, MD  Resident  03/24/17 [phone]

## 2017-03-24 NOTE — Unmapped (Signed)
Pt discharged home. Pt is stable and ambulatory with steady gait. Pt with family and in no apparent distress and with stable vital signs. Pt signed and verbally affirmed discharge, follow up, and medication instructions. Pt discharged with 0 Rx. Pt discharged with arm sling

## 2017-03-24 NOTE — Unmapped (Signed)
ED Attending Attestation Note    Date of service:  03/24/2017    This patient was seen by the resident physician.  I have seen and examined the patient, agree with the workup, evaluation, management and diagnosis. The care plan has been discussed and I concur.     My assessment reveals a 76 y.o. male who presents to the ED aftera  Fall. On my examination, he has pain to left shoulder, and right knee from the fall. There is an abrasion on the R knee, no cellultiis or swelling. No active bleeding.

## 2017-03-27 ENCOUNTER — Ambulatory Visit: Payer: MEDICARE

## 2017-04-03 NOTE — Telephone Encounter (Signed)
Returned call to patient to advise we are waiting on ECHO results, he advised will be having done on 04/13/17 and will lmovm to my direct phone number.

## 2017-04-03 NOTE — Unmapped (Signed)
Pt following up on scheduling sonogram states he never heard back. Pt follow up pt ph [phone]

## 2017-04-13 ENCOUNTER — Encounter: Attending: Internal Medicine | Primary: Internal Medicine

## 2017-04-13 NOTE — Telephone Encounter (Signed)
Patient dropped off EKG report, I called to advise we are waiting for ECHO results, I gave him scheduling number for cardio.

## 2017-04-14 ENCOUNTER — Ambulatory Visit: Admit: 2017-04-14 | Discharge: 2017-04-14 | Payer: MEDICARE | Attending: Internal Medicine | Primary: Internal Medicine

## 2017-04-14 ENCOUNTER — Encounter

## 2017-04-14 DIAGNOSIS — E119 Type 2 diabetes mellitus without complications: Secondary | ICD-10-CM

## 2017-04-14 MED ORDER — ATORVASTATIN CALCIUM 10 MG PO TABS
10 | ORAL_TABLET | ORAL | 1 refills | Status: DC
Start: 2017-04-14 — End: 2017-04-15

## 2017-04-14 MED ORDER — GLIMEPIRIDE 2 MG PO TABS
2 | ORAL_TABLET | ORAL | 1 refills | Status: DC
Start: 2017-04-14 — End: 2017-12-21

## 2017-04-14 MED ORDER — CARVEDILOL 25 MG PO TABS
25 | ORAL_TABLET | ORAL | 1 refills | Status: DC
Start: 2017-04-14 — End: 2017-12-21

## 2017-04-14 NOTE — Patient Instructions (Signed)
Please bring both your glucose monitor and your glucose records to your next appointment.

## 2017-04-14 NOTE — Progress Notes (Signed)
04/14/2017     Carl Blair (DOB:  07-05-1940) is a 77 y.o. male, here for evaluation of the following medical concerns:    Chief Complaint   Patient presents with   . Hypertension   . Hyperlipidemia   . Hypothyroidism   . Diabetes        HPI    Fasting today    Lost 8 pounds on nutrisystem so far - just started recently    Glucoses around 150 fasting still    No chest pain  No edema  No dyspnea  Dr Elson Clan changed his rhythm medication - out of a fib now (has ekg)    Review of Systems   Constitutional: Negative for chills, fever and unexpected weight change.   Respiratory: Negative for cough and shortness of breath.    Cardiovascular: Negative for chest pain, palpitations and leg swelling.   Gastrointestinal: Negative for abdominal pain, constipation and diarrhea.   Endocrine: Negative for cold intolerance and heat intolerance.        See hpi   Musculoskeletal: Negative for arthralgias and myalgias.   Neurological: Negative for weakness, numbness and headaches.   Hematological: Negative for adenopathy. Does not bruise/bleed easily.   Psychiatric/Behavioral: Negative for dysphoric mood. The patient is not nervous/anxious.        Prior to Visit Medications    Medication Sig Taking? Authorizing Provider   propafenone (RYTHMOL) 300 MG tablet Take 1 tablet by mouth 3 times daily Yes [name], MD   losartan-hydrochlorothiazide (HYZAAR) 100-25 MG per tablet TAKE 1 TABLET BY MOUTH  DAILY Yes [name], MD   metFORMIN (GLUCOPHAGE) 1000 MG tablet TAKE 1 TABLET BY MOUTH  TWICE A DAY WITH MEALS Yes [name], MD   JANUVIA 100 MG tablet TAKE 1 TABLET BY MOUTH  DAILY Yes [name], MD   levothyroxine (SYNTHROID) 100 MCG tablet TAKE 1 TABLET BY MOUTH  DAILY Yes [name], MD   glimepiride (AMARYL) 2 MG tablet TAKE 1 TABLET BY MOUTH TWO  TIMES DAILY WITH MEALS Yes [name], MD   omeprazole (PRILOSEC) 40 MG delayed release capsule TAKE 1 CAPSULE BY MOUTH  DAILY Yes [name], MD   carvedilol  (COREG) 25 MG tablet TAKE 1 TABLET BY MOUTH TWO  TIMES DAILY Yes [name], MD   atorvastatin (LIPITOR) 10 MG tablet TAKE 1 TABLET BY MOUTH  DAILY Yes [name], MD   Misc Natural Products (OSTEO BI-FLEX JOINT SHIELD PO) Take by mouth daily Yes Historical Provider, MD   apixaban (ELIQUIS) 5 MG TABS tablet Take by mouth 2 times daily Yes Historical Provider, MD   glucose blood VI test strips (ACCU-CHEK AVIVA PLUS) strip TEST BLOOD SUGAR ONCE DAILY dx E11.9 Yes [name], MD   oxybutynin (DITROPAN-XL) 10 MG extended release tablet Take 10 mg by mouth daily  Yes Historical Provider, MD   ACCU-CHEK FASTCLIX LANCETS MISC Test once daily and prn dx 250.00 Yes [name], MD   Blood Glucose Monitoring Suppl DEVI by Does not apply route. Diagnosis   250.00 Yes [name], MD   SAW PALMETTO Take  by mouth daily. Indications: XRT Yes Historical Provider, MD   aspirin 81 MG EC tablet  Yes Historical Provider, MD        Social History   Substance Use Topics   . Smoking status: Never Smoker   . Smokeless tobacco: Never Used   . Alcohol use Yes      Comment: rare  Vitals:    04/14/17 1038   BP: 124/72   Site: Right Upper Arm   Position: Sitting   Cuff Size: Large Adult   Pulse: 67   SpO2: 97%   Weight: 253 lb 9.6 oz (115 kg)   Height: 6' (1.829 m)     Estimated body mass index is 34.39 kg/m as calculated from the following:    Height as of this encounter: 6' (1.829 m).    Weight as of this encounter: 253 lb 9.6 oz (115 kg).    Physical Exam   Constitutional: He is oriented to person, place, and time. He appears well-developed and well-nourished.   Eyes: Pupils are equal, round, and reactive to light. No scleral icterus.   Neck: Normal range of motion. No JVD present.   Cardiovascular: Normal rate, regular rhythm and intact distal pulses.    Pulmonary/Chest: Effort normal and breath sounds normal.   Abdominal: Soft. Bowel sounds are normal.   Musculoskeletal: He exhibits no edema.   Neurological: He  is alert and oriented to person, place, and time.   Psychiatric: He has a normal mood and affect. His behavior is normal. Judgment and thought content normal.       ASSESSMENT/PLAN:  1. Type 2 diabetes mellitus without complication, without long-term current use of insulin (HCC)  Check lab  Please bring both your glucose monitor and your glucose records to your next appointment.     - Hemoglobin A1C; Future    2. Morbid obesity due to excess calories [facility](HCC)  Better! Keep up the good work    3. Acquired hypothyroidism  Check lab  - T4, Free; Future  - TSH without Reflex; Future    4. Essential hypertension  The current medical regimen is effective;  continue present plan and medications.   - Comprehensive Metabolic Panel; Future    5. Pure hypercholesterolemia  Check lab  - CK; Future  - Comprehensive Metabolic Panel; Future  - Lipid Panel; Future    6. Elevated CK  Check lab  - CK; Future      No Follow-up on file.    An electronic signature was used to authenticate this note.    --Hughie ClossLawrence Karrington Studnicka, MD on 04/14/2017 at 10:56 AM

## 2017-04-15 LAB — LIPID PANEL
Cholesterol, Total: 166 mg/dL (ref 0–199)
HDL: 26 mg/dL — ABNORMAL LOW (ref 40–60)
LDL Calculated: 108 mg/dL — ABNORMAL HIGH (ref <100)
Triglycerides: 162 mg/dL — ABNORMAL HIGH (ref 0–150)
VLDL Cholesterol Calculated: 32 mg/dL

## 2017-04-15 LAB — TSH: TSH: 2.7 u[IU]/mL (ref 0.27–4.20)

## 2017-04-15 LAB — COMPREHENSIVE METABOLIC PANEL
ALT: 14 U/L (ref 10–40)
AST: 12 U/L — ABNORMAL LOW (ref 15–37)
Albumin/Globulin Ratio: 2.2 (ref 1.1–2.2)
Albumin: 4.6 g/dL (ref 3.4–5.0)
Alkaline Phosphatase: 90 U/L (ref 40–129)
Anion Gap: 12 (ref 3–16)
BUN: 22 mg/dL — ABNORMAL HIGH (ref 7–20)
CO2: 28 mmol/L (ref 21–32)
Calcium: 9.2 mg/dL (ref 8.3–10.6)
Chloride: 96 mmol/L — ABNORMAL LOW (ref 99–110)
Creatinine: 1.2 mg/dL (ref 0.8–1.3)
GFR African American: >60 (ref >60)
GFR Non-African American: 59 — AB (ref >60)
Globulin: 2.1 g/dL
Glucose: 191 mg/dL — ABNORMAL HIGH (ref 70–99)
Potassium: 4.9 mmol/L (ref 3.5–5.1)
Sodium: 136 mmol/L (ref 136–145)
Total Bilirubin: 0.4 mg/dL (ref 0.0–1.0)
Total Protein: 6.7 g/dL (ref 6.4–8.2)

## 2017-04-15 LAB — CK: Total CK: 72 U/L (ref 39–308)

## 2017-04-15 LAB — HEMOGLOBIN A1C
Hemoglobin A1C: 7.5 %
eAG: 168.6 mg/dL

## 2017-04-15 LAB — T4, FREE: T4 Free: 1.7 ng/dL (ref 0.9–1.8)

## 2017-04-15 MED ORDER — ATORVASTATIN CALCIUM 20 MG PO TABS
20 | ORAL_TABLET | Freq: Every day | ORAL | 1 refills | Status: DC
Start: 2017-04-15 — End: 2017-12-21

## 2017-04-19 ENCOUNTER — Inpatient Hospital Stay: Admit: 2017-04-19 | Payer: MEDICARE | Attending: Family

## 2017-04-19 DIAGNOSIS — R063 Periodic breathing: Secondary | ICD-10-CM

## 2017-04-19 LAB — ECHOCARDIOGRAM 2D WO COLOR DOPPLER COMPLETE: Left Ventricular Ejection Fraction: 58

## 2017-04-20 NOTE — Telephone Encounter (Signed)
Per patient please order the ASV machine.  His follow up with you is 3/11, we might have to push that back to meet the 31 - 90 follow up.

## 2017-04-20 NOTE — Unmapped (Signed)
ECHO done his FF is good. I can order the ASV machine make sure this is ok with him and let me know

## 2017-04-21 NOTE — Unmapped (Signed)
Message to [facility] advising new set up orders and office note in epic, titration study faxed.

## 2017-04-21 NOTE — Unmapped (Signed)
ordered

## 2017-04-21 NOTE — Unmapped (Signed)
Addended by: Candice Camp D on: 04/21/2017 02:49 PM     Modules accepted: Orders

## 2017-04-24 MED ORDER — GLUCOSE BLOOD VI STRP
ORAL_STRIP | 1 refills | Status: DC
Start: 2017-04-24 — End: 2018-03-26

## 2017-04-24 NOTE — Telephone Encounter (Signed)
Medication:   Requested Prescriptions     Pending Prescriptions Disp Refills   . blood glucose test strips (ACCU-CHEK AVIVA PLUS) strip 100 strip 1     Sig: TEST BLOOD SUGAR ONCE DAILY dx E11.9       Last Filled:      Patient Phone Number: [phone] (home) [phone] (work)    Last appt: 04/14/2017   Next appt: Visit date not found    Last Labs DM:   Lab Results   Component Value Date    LABA1C 7.5 04/14/2017       Last OARRS: No flowsheet data found.    Preferred Pharmacy:     [facility] [address] [place] - [address] - P [phone] - F [phone]  [address]  [place] [id number]  Phone: [phone] Fax: [phone]

## 2017-04-24 NOTE — Telephone Encounter (Signed)
[facility] calling for refill on ACCU-CHEK FASTCLIX LANCETS MISC [phone]

## 2017-05-10 MED ORDER — OMEPRAZOLE 40 MG PO CPDR
40 MG | ORAL_CAPSULE | ORAL | 1 refills | Status: DC
Start: 2017-05-10 — End: 2017-12-21

## 2017-05-10 NOTE — Telephone Encounter (Signed)
Medication:   Requested Prescriptions     Pending Prescriptions Disp Refills   . omeprazole (PRILOSEC) 40 MG delayed release capsule [Pharmacy Med Name: OMEPRAZOLE  40MG   CAP] 90 capsule      Sig: TAKE 1 CAPSULE BY MOUTH  DAILY        Last Filled:      Patient Phone Number: [phone] (home) [phone] (work)    Last appt: 04/14/2017   Next appt: Visit date not found    Last OARRS: No flowsheet data found.    Preferred Pharmacy:   [facility] [place] - [address] [place] [phone] F [phone]  [address] [place]  [address]  [place] [id number]  Phone: [phone] Fax: [phone]    [facility] [place] [address] - [place], [place] - [id number] - P [phone] F [phone]  [address]  [place] [id number]  Phone: [phone] Fax: [phone]

## 2017-06-16 NOTE — Unmapped (Signed)
Spoke to the patient and confirmed appt for 3/11.

## 2017-06-19 ENCOUNTER — Ambulatory Visit: Admit: 2017-06-19 | Discharge: 2017-06-19 | Payer: MEDICARE | Attending: Family

## 2017-06-19 DIAGNOSIS — R063 Periodic breathing: Secondary | ICD-10-CM

## 2017-06-19 NOTE — Unmapped (Signed)
Key points to remember  ?? You should wear the device every time you sleep, no matter when you sleep or where you sleep. When you nap or travel or go on vacation you need to use the machine when asleep.   ?? Bring your device, mask, hose, power cord etc to every office visit.   ?? If you sleep without the device your Obstructive Sleep Apnea (OSA) is untreated and your risk of heart attacks and strokes are higher.   ?? Clean the device on a regularly basis to avoid sinus infections and pneumonias   ?? Never travel with water in the reservoir   ?? Replace supplies on a regularly basis (see schedule below). You may need to contact your Durable Medical Equipment company (DME) and request supplies (just like you would medications from a pharmacy).  ?? Alcohol will worsen OSA, Alcohol can decrease your drive to breathe, slowing your breathing and making your breaths shallow. In addition, it may relax the muscles of your throat, which may make it more likely for your upper airway to collapse. REMEMBER TO ALWAYS WEAR YOUR PAP DEVICE WHEN SLEEPING.   ?? Certain medications can also worsen OSA such as some pain medications, anti-anxiety medications, muscle relaxants, testosterone supplementation. Please inform your sleep provider which medications you take on a regular basis.   ?? All patients are advised to make follow up appointments with the provider after they are set up with their PAP device. Most insurance plans now require the patient set up a follow up appointment with the provider 31-90 days after they have been set up with their device and at least once a year thereafter. Discuss these issues with your provider for more details.   ?? Please note when you are provided with a mask you have a manufacturer 30 day guarantee that comes with it. If you do not like that mask for any reason contact your DME and request an exchange. After 30 days you may not be eligible for a new mask for another 3-6 months depending on insurance. Talk  to your DME or sleep provider for more details.   ?? If you are struggling with nasal congestion try to use a nasal sinus rinse available over the counter such as Netti Pot 1 hour before bedtime (you may have to use it daily for a 7-10 days then as needed). You can also try over the counter Flonase or Nasacort 1-2 sprays in each nostril at bedtime.   ?? If you ever have problems with the device or the plan discussed with your sleep provider please contact your provider by phone or using MyChart. Thank you      CLEANING INSTRUCTIONS FOR PAP EQUIPMENT     Keeping your equipment and supplies clean is very important.     REMINDER: Only use DISTILLED WATER in your humidifier, Empty water daily.    DAILY  Mask and tubing:   ?? Wash your face before applying mask  ?? Wash mask and tubing with baby shampoo and warm water.     Humidifier:   ?? Empty water in reservoir  ?? Clean with baby shampoo and warm water  ?? Rinse, then air dry    WEEKLY  Mask and tubing:   ?? Soak your mask and tubing in 1 part vinegar and 3 parts water for 30 minutes. Rinse, and allow to air dry.     Humidifier:   ?? Wash with warm water and baby shampoo  ?? Soak in   1 part vinegar and 3 parts water for 30 minutes  ?? Rinse with warm water and allow to air dry    Machine Exterior:   ?? Wipe with a clean damp cloth    MONTHLY AND/OR AS NEEDED  ?? Reusable foam filters (black filter)- wash in warm water with baby shampoo. Rinse well and dry with paper towel  ?? Disposable felt filter (white filter)- Replace filter every two weeks to once a month    NOTE: If you are having repeated sinus and /or respiratory infections, dirty equipment may be the cause. It may help to clean and disinfect your equipment more frequently    TRAVELING  Always make sure the humidifier is empty when traveling. (including doctor appointments, air travel or long distance driving).  When flying, always carry your PAP device with you as a carry on item, NEVER check it in as baggage. We can  provide you with a letter stating it is a medical device, talk to your provider.    Always make sure you have your mask and tubing with you. You will need appropriate plug adapters when traveling outside the [place].    If travelling by air and you are unable to carry distilled water with you use bottled water (no more than 2 weeks). DO NOT USE TAP WATER.       Equipment Replacement Schedule    To get the most benefit from your PAP therapy, your equipment should be replaced when necessary based on wear and tear. For example, your mask may need to be replaced if you notice it is cracked or the seal is leaking. If your tubing is torn, it needs to be replaced.   If your equipment is showing signs of wear, you may be entitled to replace it. The replacement schedule for Medicare patients is shown below. If you are NOT a Medicare patient, please check with your DME ([employer] Medical [employer]) provider for your individual insurance policy???s replacement schedule.     Supplies   Medicare Medicaid My Insurance Plan  Mask    1 per 3 m 1 per year _______________  Nasal cushion  2 per m 1 per 6 m _______________  Pillows cushion  2 per m 1per 6 m _______________  Full-face cushion  1 per m 1per 6 m _______________  Headgear   1 per 6 m 1 per year _______________  Water Chamber  1 per 6 m N/A  _______________  Chinstrap   1 per 6 m 1 per 6 m       _______________  Tubing/Hose   1 per 3 m 1 per year _______________  Heater wire tubing  1 per 3 m N/A  _______________  Filter, disposable (white) 2 per m 1 per m _______________  Filter, particle foam (black) 1 per 6 m 4 per year _______________  Therapy device  1 per 5 yrs  ?  _______________    Follow up and compliance goals for Medicare and Medicaid patients   (most private insurances are now following similar rules)  Medicare and Medicaid require patients to follow-up with their provider after they are set up with their PAP equipment. The follow up appointment should be within  31-90 days after receiving their equipment. If patient does not follow up as directed there might be issues with the cost of the device being covered by the insurance. Therefore please ensure you make a follow up appointment with your sleep provider.      Insurance   also requires that the patient meet some compliance goals such as the download should indicate patient has been wearing the device for at least 4 hours per night AND they are using the device for 30 nights in a row.      HUMIDIFICATION feature on your PAP device. Most machines are pre-set for a humidity setting of 3 or 4. You can manually change the humidifier settings on your machine when you turn the machine on. Increase the humidity level one increment at a time.  Respironics device: Turn the on/off button to the left for less humidification or the right for more humidification. Humidification ranges from 1-5. If you have a heated hose talk to your provider or DME who can teach you how to make adjustments.     F&P device: Turn the large dial to the right for higher level and left for lower level. Humidification ranges from 1-7.     ResMed S9 device: Attach the humidifier component to the main unit, turn   the dial and choose the water droplet icon; press the button once. Then turn the dial to adjust your humidity setting. Press button a final time to save the setting. Humidification ranges from 1-8        Resmed S10 Device - This device has an Auto humidification feature.    If you have a heated hose it defaults to Auto Setting. You can change it to           manual setting under My Options. Switch Climate Control from Auto to            Manual. Then you will see two other options pop up. Tube Temperature and Humidity Level. Tube temperature is like your thermostat how hot or cold you want the air to be. It can range from 60-86 degree (most defaults to 81 degrees). Humidity Level is the next option this is the Moisture that is captured from the  distilled water placed in the chamber. It goes from 1-8. Higher the # more moister. SEE THIS VIDEO https://www.youtube.com/watch?[email or web address]&sns=em         For example, if you are waking up with a dry mouth, you should turn your humidifier up one number at a time. If you are getting excess condensation (water droplets) in your mask or tubing, you should turn the humidifier down one number at a time. In summer you need less humidity in winter you need more humidity. THIS NUMBER RARELY STAYS THE SAME ALL YEAR LONG. YOU HAVE TO CHANGE IT DEPENDING ON HOW YOU FEEL AND WHAT THE WEATHER IS OUTSIDE.     A prescription will be sent to a DME (medical supply company) covered by your insurance. The DME Company should contact you within 7-10 business days once they receive the prescription. If you have not received a call from the DME Company regarding your device, PLEASE CALL US so we may investigate further.   DROWSY DRIVING TIPS    These suggestions will help prevent you from the risk of drowsy driving.     1. If you feel tired or drowsy don't drive. Sleepiness is a major cause of motor vehicle accidents and accounts for 40% of all fatal crashes reported on the [place] thru[place]. No matter how much you think you can control sleepiness, you can't.     2. Ensure you follow your doctor's advice about the treatment for your sleep disorder. For example, if you have sleep apnea and use CPAP, ensure you use it   fully the night before your trip.     3. Get a good night's sleep before driving. Do not cut yourself short of sleep if you plan a long drive the next day. Get to bed early and do not stay up late packing.     4. Avoid alcohol both the night before your trip and during your trip. Alcohol will disrupt sleep and make you more tired the next day. Sleepiness and alcohol are additive in increasing impairment of your driving ability.     5. Avoid any sedative medications, including sedative antihistamines that are often contained in  cold or allergy medications, the night before you drive as they may have long lasting effects the next day.     6. Travel during non-sleeping hours. Accidents due to sleepiness are more common during the nighttime hours.     7. If sleepy, stop and rest. Drink coffee and walk around. Take a brief nap, lock your car doors and take a nap in your car if you are sleepy. Have a 10-15 minute break after every 2 hours of driving.     8. Drive with a companion. Share the driving. Relax in the back seat until it is your time to share the driving again.     These are only guidelines; please discuss your diagnosis and treatment with your sleep medicine provider. Drive safely.       Sleep Hygiene     Sleep disruption is common, especially during times when you may feel emotionally overwhelmed. Anxieties, relentless replay of the day???s events and heightened emotions may significantly interfere with your sleep. Lack of sleep robs you of needed rest, making management of your illness more difficult.   Bring sleep patterns under control and working at a consistent, stable pattern is very important to illness management. You need your rest. The most common cause of insomnia is a change in your daily routine. For example traveling, change in work hours, disruption of other behaviors (eating, exercise, leisure, etc.), relationship conflicts may cause sleep problems. Paying attention to good sleep hygiene is the most important thing you can do to maintain good sleep.     Do:   1. Go to bed at the same time each day.   2. Get up from bed at the same time each day.   3. Get regular exercise each day, preferably in the morning. There is good evidence that regular exercise improves restful sleep. This includes stretching and aerobic exercise.   4. Get regular exposure to outdoor or bright lights, especially in the late afternoon.   5. Keep the temperature in your bedroom comfortable.   6. Keep the bedroom quiet when sleeping   7. Keep the  bedroom dark enough to facilitate sleep.   8. Use your bed only for sleep and sex.   9. Take medications as directed. It???s often helpful to take prescribed sleeping pills one hour before bedtime, so they are causing drowsiness when you lie down, or 10 hours before getting up to avoid daytime drowsiness.   10. Use a relaxation exercise just before going to sleep, (muscle relaxation, imagery, massage, breathing exercises, warm baths).   11. Keep your feet and hands warm. Wear warm socks and/or mittens or gloves to bed.     Don???t:   1. Exercise just before going to bed.   2. Engage in stimulating activity just before bed, such as playing a competitive game, watching an exciting program on TV or movie, or having   an important discussion with a loved one.   3. Have caffeine in the evening (coffee, many teas, chocolate, sodas, etc). No caffeine after noon.    4. Read or watch TV in bed.   5. Use alcohol to help you sleep.   6. Go to bed too hungry or too full.   7. Take another person???s sleeping pills.   8. Take over-the-counter sleeping pills without your doctor???s knowledge. Tolerance can develop rapidly with these medications. Diphenhydramine (an ingredient commonly found in over-the-counter sleep medications) can have serious side effects for elderly patients.   9. Take daytime naps.   10. Command yourself to go to sleep. This only makes your mind and body more alert.   11. Do not look at the time when you wake up in the middle of the night. Remove the clocks from the bedroom or set your alarm for the morning and have it face the wall.   If you lie in bed awake for more than 20-30 minutes, get up, go to a different room (or different part of the bedroom), and participate in a quiet activity (non-excitable reading or listen to soothing music) then return to bed when you feel sleepy. Do this as many times during the night as needed.

## 2017-06-19 NOTE — Unmapped (Signed)
~~~~~~~~~~~~~~~~~~~~~~~~~~~~~~~~~~~~~~~~~~~~~~~~~~~  Referring Provider: Dr Newman Pies    Chief Complaint: Follow up OSA on ASV compliance visit    Date of NPSG: 05/27/2003   SE %: 63.5   AHI: 52   Lowest p ox %: 73   NPSG performed at: [facility]     Date of CPAP: 12/06/2008   SE %: 76.5   AHI: 1.1   Lowest p ox %: 90.0   CPAP performed at: [facility] @ time of study: 265 lbs  DME Company: [employer]  Mask type: full face  cmH2O: 13     PAP TITRATION REPORT        Patient:  [name], [name] Date: 03/09/2017   DOB: 04-Jan-1941 PSG Study #: 490/19   Age:  77 year Referring Physician: [name] [phone])   Sex:  Male PSG Tech: [name], [name]   Height(in.): 6' Scored by: [name], [name]   Weight(lbs): 257.0 lbs Ordering Provider: Lieutenant Montoya ([id number])   BMI: 35.2 Neck Size: -       Impressions:  1. Persistent central apneas with an index greater than 18 per hour on fullface mask BiPAP.  2. Improvement in sleep architecture and oxygenation with full facemask ASV configured as follows: EPAP 6 cm of water, pressure support minimum 0 cm of water, pressure support maximum 20 cm water, maximum machine pressure 25 cm water.  3. Exclusively supine sleep observed during this study.  4. Reduced sleep efficiency.    Methodology:  This was an attended polysomnography examination performed by a registered polysomnography technician.  An XL Tech acquisition unit was utilized. Data channels included right and left EOG, submental electromyogram, six electroencephalogram channels (including bilateral central, frontal and occipital leads), electrocardiogram, PTAF and/or thermistor, thoracic and abdominal respiratory impedance plethysmography, continuous pulse oximetry, and bilateral tibial leads. Sleep staging, arousal identification, respiratory event identification, leg movement identification was performed consistent with the American Association of sleep medicine guidelines. Hypopneas were defined per Medicare requirements requiring  a 4% or greater oxyhemoglobin desaturation, or if appropriate, the alternative hypopnea rule. Digital video was recorded and was synchronized to the electroencephalogram.  Data interpretation:  This study was reviewed on an epoch by epoch basis in its entirety by a board certified (American Association of Sleep Professionals and Special educational needs teacher) physician.  A polysomnography examination with BiPAP/ASV titration was performed.  This study included all supine sleep.  Sleep architecture was notable for increased wakefulness after sleep onset, reduced sleep efficiency and improved sleep quality on ASV therapy.  Pap was titrated with a ResMed F 20 fullface mask.  BiPAP pressures included 11-14/6.  Monitoring time at 13/6 cm water BiPAP pressure was quite short period ASV pressures were as follows:  EPAP minimum 6 cm water, EPAP maximum 16 cm water, pressure support minimum 0 cm of water, pressure support maximum 20 cm water, maximum machine pressure 25 cm water, backup rate = auto .  With respect to the BiPAP portion of the study REM sleep was captured at 13-14 centimeters of water.  Mild to moderate oxyhemoglobin desaturations occurred during REM sleep at pressures tested.  Respiratory events and effort were erratic.  Sleep was fragmented.  The central apnea index was greater than 18 per hour on BiPAP.  ASV was then introduced because of the recurrent central events.  EPAP was left in an auto titrating mode but on review of the pressure data never exceeded 7 cm water.  Respiratory effort modulation was present throughout the ASV portion  of the study.  Backup rate engaged on a modest number of occasions.  Overall sleep quality/architecture was better on ASV treatment utilizing the full facemask.  Baseline oxyhemoglobin saturation was normal at 96 percent.  The oxyhemoglobin saturation nadir for this study was 81 percent.  Cardiac rhythm was regular.  Occasional atrial and ventricular topics were  observed.  Periodic limb movements during sleep occurred occasionally but produced no significant independent sleep fragmentation.  No sleep-related seizure activity was identified.                     [name], M.D.        HPI: Richard Ballengee Johnsonis a 77 y.o. 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 He likes his mask and pressure except when he wakes up in the middle of the night he feels its high. He has no rain out, no dry mouth. He is getting supplies and cleaning his supplies. He has no snoring that he is aware of on ASV, no drowsy driving or excessive daytime sleepiness     Hx of afib and CHF back in 2017 EF was good     No other changes in his medical, surgical, social or family history or any hospitalizations or changes in medications since the last visit    Previous UPPP, oral venting and likes his FF mask       HTN, DM, atrial fib - corrected with mini-maze procedure) GERD, dylipidemia      DEVICE/ MASK/ DME INFORMATION  DME: medical services  Mask type: Airfit F10 large FF mask    Compliance Data Download: 05/17/2017-06/15/2017  Device Model: RESMED S10  Mask: airfit F10 med  PAP Pressure: min EPAP 6 max 10 H2O min PS 5 max PS 19   average pressure EPAP 9.7 average IPAP 20.5  Percent Days with Device Usage: 100 %  Percent of Days with usage >= 4 hours: 100 %  Average Usage (number of hours used all days): 7 hrs 48 min  AHI: 6.6  Leak 5.2 average  Detailed report ran 06/18/2017-06/12/2017 EPAP is maxing out    EPWORTH SLEEPINESS SCALE: (at or above 10 is abnormal and 24 is the maximum score)  Epworth Sleepiness Scale 04/14/2011 02/22/2016 02/21/2017 03/16/2017 06/19/2017   Sitting and reading 2 1 1  0 1   Watching TV 3 2 2 1 1    Sitting, inactive in a public place (e.g. a theatre or a meeting) 1 0 0 1 1   As a passenger in a car for an hour without a break 2 0 1 0 0   Lying down to rest in the afternoon when circumstances permit 3 2 2 1 1    Sitting and talking to someone 0 0 0 0 0    Sitting quietly after a lunch without alcohol 1 1 0 0 0   In a car, while stopped for a few minutes in traffic 0 0 0 0 0   Total score 12 6 6 3 4        Sleep Schedule  Bedtime is 12a. Falls asleep within 15 minutes, with 1 nighttime awakenings for bathroom. Falls back asleep in 10 minutes. Sleeps on own bed. Awakens at 7a during the week and same on the weekends.   Patient thinks they get approximately 6 hours of sleep a night.      Patient does takes naps 1 per week for 30 min    Past Medical Hx:  Past Medical History:   Diagnosis Date   ??? Atrial fibrillation (CMS Dx)     corrected with Mini-maze procedure   ??? Cataract     left eye   ??? Diabetes mellitus (CMS Dx)     type 2   ??? Dilated cardiomyopathy (CMS Dx)    ??? Hearing loss     left ear   ??? Hypercholesteremia    ??? Hypertension    ??? Sleep apnea    ??? Thyroid disease     hypothyroidism     SurgHx:   Past Surgical History:   Procedure Laterality Date   ??? COLONOSCOPY W/ POLYPECTOMY      benign  has at 3 plus procedures   ??? HERNIA REPAIR  9/09    right inguinal, mini maze   ??? HERNIA REPAIR  2/10    herniated lungs bilateral (repaired with mesh)   ??? mini-maze      corrected his A-fib   ??? SEPTOPLASTY     ??? UVULOPALATOPHARYNGOPLASTY     ??? UVULOPALATOPHARYNGOPLASTY      has had esophageal dilations in the past (Dr.Decter)   ??? WISDOM TOOTH EXTRACTION       Problem List:  Patient Active Problem List   Diagnosis   ??? Other, multiple, and unspecified sites, insect bite, nonvenomous, without mention of infection(919.4)   ??? Hemangioma of intra-abdominal structures   ??? Other specified disorders of liver   ??? 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 Disorder of esophagus   ??? Chest pain   ??? Contact dermatitis and other eczema, due to unspecified cause   ??? Morbid obesity (CMS Dx)   ??? Obstructive sleep apnea   ??? Hypothyroidism   ??? Type II or unspecified type diabetes mellitus without mention of complication, not stated as uncontrolled (CMS Dx)   ??? Other and unspecified  hyperlipidemia   ??? Essential hypertension   ??? Atrial fibrillation (CMS Dx)   ??? Congestive heart failure (CMS Dx)   ??? Hemorrhoids   ??? Esophageal reflux   ??? Pain in limb   ??? Special screening for malignant neoplasm of prostate   ??? Multiple rib fractures   ??? OSA (obstructive sleep apnea)     CURRENT MEDICATIONS:   Current Outpatient Prescriptions   Medication Sig   ??? apixaban Take 5 mg by mouth 2 times a day.   ??? aspirin Take 81 mg by mouth daily.   ??? atorvastatin Take 10 mg by mouth at bedtime.    ??? carvedilol Take 25 mg by mouth 2 (two) times daily.     ??? glimepiride Take 1 tablet by mouth  every morning before  breakfast   ??? GLUC/CHON-MSM#1/C/MANG/BOS/BOR (OSTEO BI-FLEX ORAL) Take by mouth. OSTEO BI-FLEX JOINT SHIELD TABS    ??? glyBURIDE-metformin Take 2 tablets by mouth 2 times a day.    ??? levothyroxine Take 100 mcg by mouth every morning.    ??? losartan-hydrochlorothiazide Take 1 tablet by mouth daily.   ??? omeprazole Take 40 mg by mouth every morning.    ??? propafenone Take 225 mg by mouth 3 times a day.   ??? SAW PALMETTO XTR/ZINC PICOLIN (SAW PALMETTO EXTRACT ORAL) Take 2 tablets by mouth 2 times a day.    ??? chorionic gonadotropin, human Inject 10,000 Units into the muscle once.     No current facility-administered medications for this visit.       Allergies  Allergies as of 06/19/2017   ??? (  No Known Allergies)     Family Hx:   Family History   Problem Relation Age of Onset   ??? Diabetes Mother         type 2   ??? Hypertension Mother    ??? Coronary artery disease Sister    ??? Diabetes Sister         type 2   ??? Coronary artery disease Sister    ??? Coronary artery disease Brother    ??? Hypertension Brother    ??? Diabetes Brother         type 2   ??? Hypertension Brother      Social Hx:   Patient  reports that he has never smoked. He has never used smokeless tobacco. He reports that he does not drink alcohol or use drugs.  Please see attached form for further details.     Review of Systems  General ROS: negative  Ophthalmic ROS:  negative  Ears, nose, mouth, throat, and face ROS: negative  Respiratory ROS: all negative  Cardiovascular ROS: negative  Gastrointestinal ROS: negative  Genitourinary VWU:JWJXBJYN 1 x per night  Integument/breast ROS: negative  Hematologic/lymphatic ROS: negative  Musculoskeletal WGN:FAOZHYQM  Neurological ROS: negative  Psychological ROS: all negative  Endocrine ROS: all  negative  Allergy and Immunology ROS: negative    Denies: suicidal ideas, irritability, depression, racing mind, changes in appetite, fainting spells, alcoholism, tremors, using illegal drugs, worrying about inability to sleep, insomnia, chest pain and shortness of breath,    I have reviewed the review of system and advised patient to contact primary care physician or specialist for non sleep related issues.    Previous Report(s) Reviewed: historical medical records, lab reports, office notes      The following portions of the patient's history were reviewed and updated as appropriate: allergies, current medications, past family history, past medical history, past social history, past surgical history and problem list.    OBJECTIVE :   BP 122/60    Pulse 67    Resp 16    Ht 6' 0.01 (1.829 m)    Wt (!) 244 lb (110.7 kg)    SpO2 97%    BMI 33.08 kg/m??    Wt Readings from Last 3 Encounters:   06/19/17 (!) 244 lb (110.7 kg)   03/24/17 (!) 259 lb 3.2 oz (117.6 kg)   03/16/17 (!) 260 lb 9.6 oz (118.2 kg)       Physical Exam   Constitutional: alert and in no apparent distress. Well-developed and well-nourished.   Head: normocephalic and atraumatic.   Ears: appear normal externally bilaterally  Eyes: Conjunctivae are normal. Pupils are equal, round, no scleral icterus, drainage or redness.   Nose: Columella normal. Positive for nasal congestion. No deviated septum.   Mouth : Mallampati 4. Crowded airway. Oropharynx is clear, moist and pink. UPPP,  Macroglossia w/ ridging. Dentition poor missing teeth.  Neck:  grossly normal range of motion.  Trachea  midline.    Cardiovascular: normal rate and regular rhythm. Neg murmur.   Pulmonary/Chest: effort normal and breath sounds normal. No stridor. No respiratory distress, no wheezes, no rales.   Abdominal: deferred   Musculoskeletal: Grossly normal range of motion and strength. Normal gait.   Neurological:  alert. Cranial nerves II-XII grossly intact. Strength 5/5 in upper extremities bilaterally. Normal gait.  No tremor.   Skin: skin is warm and dry. No rash noted. Multiple healing skin lesions on arms and face  Psychiatric: normal speech, normal  mood and affect, behavior is normal. Judgment and thought content normal.     Labs Reviewed:   No results found for: IRON  Lab Results   Component Value Date    WBC 8.7 02/07/2013    HGB 13.0 (L) 02/07/2013    HCT 38.7 02/07/2013    MCV 84.7 02/07/2013    PLT 219 02/07/2013     Lab Results   Component Value Date    GLUCOSE 146 (H) 05/12/2015    BUN 22 05/12/2015    CO2 29 05/12/2015    CREATININE 1.12 05/12/2015    K 4.2 05/12/2015    NA 136 05/12/2015    CL 99 05/12/2015    CALCIUM 9.5 05/12/2015     Lab Results   Component Value Date    HGBA1C 7.2 (H) 04/15/2008     Lab Results   Component Value Date    TSH 1.50 04/15/2008    FREET4 1.6 04/15/2008     Transthoracic Echocardiography    Patient: ?? ??Giles, Currie  MR #: ?? ?? ?? [id number]  Account:  Study Date: 02/17/2016  Gender: ?? ?? M  Age: ?? ?? ?? ??75  DOB: ?? ?? ?? ??[birth year]  Room: ?? ?? ?? [facility]    ??ORDERING ?? ??Eda Keys, MD  ??OPERATOR ?? ??Mardee Postin RVT  ??PERFORMING ??[facility], [place]    --------------------------------------------------------------------    Procedure:ECHO 2D ?? ?? ?? ??Order:ECHO 2D ?? ?? ?? ??Accession  COMPLETE (TTE) ?? ?? ?? ?? ?? COMPLETE (TTE) ?? ?? ?? Number:[id number]    --------------------------------------------------------------------  Indications: ?? ?? ??Atrial fibrillation - paroxysmal 427.31. ??PACs  427.61.    --------------------------------------------------------------------  PMH: ??  Atrial fibrillation. ??Risk factors: ??Hypertension. Diabetes  mellitus. Dyslipidemia.    --------------------------------------------------------------------  Study data: ?? Study status: ??Routine. ??Procedure: ??Transthoracic  echocardiography. Image quality was technically difficult. Scanning  was performed from the parasternal, apical, and subcostal acoustic  windows. ?? ?? ?? ?? ??Transthoracic echocardiography. ??M-mode, complete  2D, complete spectral Doppler, and color Doppler. ??Birthdate:  Patient birthdate: Jul 29, 1940. ??Age: ??Patient is 77yr old. ??Sex:  Gender: male. ??Height: ??Height: 72in. Height: 72in. ??Weight:  Weight: 259.5lb. Weight: 259.4lb. ??Body mass index: ??BMI:  35.3kg/m^2. ??Body surface area: ?? ??BSA: 2.13m^2. ??Blood pressure:  ??151/91 ??Patient status: ??Outpatient. ??Study date: ??Study date:  02/17/2016. Study time: 09:48 AM. ??Location: ??Echo laboratory.  ??  --------------------------------------------------------------------  Study Conclusions    - Left ventricle: The cavity size was normal. Wall thickness was  ????normal. Systolic function was normal. The estimated ejection  ????fraction was in the range of 60% to 65%. Wall motion was normal;  ????there were no regional wall motion abnormalities.  - Mitral valve: Mild regurgitation.  - Left atrium: The atrium was mildly dilated.  - Right ventricle: Systolic function was normal.    ASSESSMENT  Zenas Santa Johnsonis a 77 y.o. 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 He has excellent compliance 100%. He likes his mask and pressure except when he wakes up in the middle of the night he feels its high. He has no rain out, no dry mouth. He is getting supplies and cleaning his supplies. He has no snoring that he is aware of on ASV, no drowsy driving or excessive daytime sleepiness           Hx of afib and CHF back in 2017 EF was good    BMI 33    HTN, DM, atrial fib - corrected with mini-maze  procedure) GERD, dylipidemia  UPPP- oral venting and now  using FF mask    PLAN:   1. Central sleep apnea with cheynne stokes breathing on ASV AHI slightly elevated  Reviewed  ASV download with pt  reviewed 4 day detailed report  changed pressure in the Office and script written Min EPAP 6 max 12 cmH2O min PS 4 max 15   He can turn the machine off and then back on when he gets up in the middle of the night when he wakes up the pressure will start lower again.   Check a download in a week to look at the pressure   DME : Medical services      Patient was educated regarding OSAHS pathophysiology, relationship with cardiovascular, cerebrovascular and metabolic morbidity and mortality, issues related to PAP adherence, its use, cleaning and maintenance.  if any issues were to arise before their next office visit they may follow up sooner or contact their DME company for assistance.  Patient has been advised that anything preventing them from using the device on a daily basis is a concern that should be relayed to [place] their sleep provider. Patient verbalized understanding and agrees to comply.     Exercise and weight loss encouraged    The patient was educated on the dangers of drowsy driving; advised to avoid long distance or night driving. Instructed to take a short nap on the side of the road if sleepy when driving. Tips to avoid drowsy driving were provided to patient.     Sleep hygiene techniques were provided to patient.     Return to clinic 10 months for yearly if doing well sooner if having any problems    SLEEP QUALITY MEASURES:   Obstructive Sleep Apnea Follow Up Visit:   Adherence to therapy documented at least annually?: Yes    Reassessment of excessive daytime sleepiness?: Yes    Weight / body mass index assessment at visit?: Yes    If body mass index greater than 25 was discussion of weight management discussed? : Yes    Blood pressure assessed at visit? : Yes          Medical Decision Making:   The following items were considered in medical decision making:   Review  / order other diagnostic tests/interventions   Risks & Benefits:   Risks, benefits and treatment options discussed with patient.

## 2017-06-20 NOTE — Unmapped (Signed)
Message to [facility] advising orders in epic for in office pressure change on ASV, please update records.

## 2017-07-03 NOTE — Unmapped (Signed)
Download Detail in your folder.

## 2017-07-03 NOTE — Unmapped (Signed)
Made a pressure change  ASV Min EPAP 6 max 15 cmH2O  Please call him and let him know I reviewed it and the AHI some days is still high so I increased the max EPAP number

## 2017-07-04 NOTE — Unmapped (Signed)
Patient notified of change made, he will call if he has problems or issues.

## 2017-07-04 NOTE — Unmapped (Signed)
Message to [facility] advising orders in epic for pressure change on pap therapy.

## 2017-07-12 NOTE — Telephone Encounter (Signed)
LM for patient return call for Rx refill and to inform me if he is going to establish with Dr. Sheliah PlaneKesav or look for another provider.

## 2017-07-12 NOTE — Telephone Encounter (Signed)
Last filled: 03/22/17  Last Office Visit (non-acute): 04/14/17  Next Office Visit: not scheduled

## 2017-07-14 NOTE — Telephone Encounter (Signed)
Patient scheduled a preop with Dr. Sheliah Plane in May for June surgery . He is not sure when next blood work needs to be done. Also his pharmacy sent in refills and said not sure if we got it. cb patient to go over all this.

## 2017-07-14 NOTE — Telephone Encounter (Signed)
Left message for return call.

## 2017-07-21 MED ORDER — LOSARTAN POTASSIUM-HCTZ 100-25 MG PO TABS
100-25 MG | ORAL_TABLET | Freq: Every day | ORAL | 1 refills | Status: DC
Start: 2017-07-21 — End: 2022-04-20

## 2017-07-21 NOTE — Telephone Encounter (Signed)
Medication:   Requested Prescriptions     Pending Prescriptions Disp Refills   ??? losartan-hydrochlorothiazide (HYZAAR) 100-25 MG per tablet 90 tablet 1     Sig: Take 1 tablet by mouth daily       Last Filled:  03/22/18    Patient Phone Number: [phone] (home) [phone] (work)    Last appt: 04/14/2017   Next appt: 09/06/2017    Last BMP:   Lab Results   Component Value Date    NA 136 04/14/2017    K 4.9 04/14/2017    CL 96 04/14/2017    CO2 28 04/14/2017    ANIONGAP 12 04/14/2017    GLUCOSE 191 04/14/2017    GLUCOSE 274 03/08/2016    BUN 22 04/14/2017    CREATININE 1.2 04/14/2017    LABGLOM 59 04/14/2017    GFRAA >60 04/14/2017    GFRAA >60 08/15/2011    CALCIUM 9.2 04/14/2017      Last CMP:   Lab Results   Component Value Date    NA 136 04/14/2017    K 4.9 04/14/2017    CL 96 04/14/2017    CO2 28 04/14/2017    ANIONGAP 12 04/14/2017    GLUCOSE 191 04/14/2017    GLUCOSE 274 03/08/2016    BUN 22 04/14/2017    CREATININE 1.2 04/14/2017    LABGLOM 59 04/14/2017    GFRAA >60 04/14/2017    GFRAA >60 08/15/2011    PROT 6.7 04/14/2017    PROT 6.8 05/04/2011    LABALBU 4.6 04/14/2017    AGRATIO 2.2 04/14/2017    BILITOT 0.4 04/14/2017    ALKPHOS 90 04/14/2017    ALT 14 04/14/2017    AST 12 04/14/2017    GLOB 2.1 04/14/2017     Last Renal Function:   Lab Results   Component Value Date    NA 136 04/14/2017    K 4.9 04/14/2017    CL 96 04/14/2017    CO2 28 04/14/2017    GLUCOSE 191 04/14/2017    GLUCOSE 274 03/08/2016    BUN 22 04/14/2017    CREATININE 1.2 04/14/2017    LABALBU 4.6 04/14/2017    CALCIUM 9.2 04/14/2017    GFR >60 08/15/2011    GFRAA >60 04/14/2017    GFRAA >60 08/15/2011       Last OARRS: No flowsheet data found.    Preferred Pharmacy:   [facility] [place] [address] [place] [phone] - [phone]  [address] [place]  [address]  [place] [id number]  Phone: [phone] Fax: [phone]

## 2017-07-21 NOTE — Telephone Encounter (Signed)
Needs refill  On losartan-hydrochlorothiazide (HYZAAR) 100-25 MG per tablet  [facility] ref # [id number]

## 2017-07-23 ENCOUNTER — Inpatient Hospital Stay: Admit: 2017-07-23 | Discharge: 2017-07-23 | Disposition: A | Discharge status: Home / Self Care | Payer: MEDICARE

## 2017-07-23 DIAGNOSIS — S61211A Laceration without foreign body of left index finger without damage to nail, initial encounter: Secondary | ICD-10-CM

## 2017-07-23 MED ORDER — lidocaine 10 mg/mL (1 %) injection 5 mL
10 | Freq: Once | INTRAMUSCULAR | Status: AC
Start: 2017-07-23 — End: 2017-07-23

## 2017-07-23 MED ORDER — acetaminophen-codeine (TYLENOL-CODEINE #3) 300-30 mg per tablet
300-30 | ORAL_TABLET | Freq: Four times a day (QID) | ORAL | 0 refills | 8.00000 days | Status: AC | PRN
Start: 2017-07-23 — End: 2017-07-27

## 2017-07-23 MED ORDER — bacitracin-polymyxin b (POLYSPORIN) ointment (packets)
500-10000 | Freq: Once | TOPICAL | Status: AC
Start: 2017-07-23 — End: 2017-07-23
  Administered 2017-07-23: 06:00:00 1 via TOPICAL

## 2017-07-23 MED ORDER — sodium chloride, irrigation 0.9 % irrigation 500 mL
0.9 | Freq: Once | Status: AC
Start: 2017-07-23 — End: 2017-07-23

## 2017-07-23 MED ORDER — diphth,pertus(acell),tetanus (BOOSTRIX TDAP) 2.5-8-5 Lf-mcg-Lf/0.5mL syringe Syrg 0.5 mL
2.5-8-5 | Freq: Once | INTRAMUSCULAR | Status: AC
Start: 2017-07-23 — End: 2017-07-23
  Administered 2017-07-23: 06:00:00 0.5 mL via INTRAMUSCULAR

## 2017-07-23 MED ORDER — sodium chloride, irrigation 0.9 % irrigation
0.9 | Status: AC
Start: 2017-07-23 — End: 2017-07-23
  Administered 2017-07-23: 06:00:00 500

## 2017-07-23 MED ORDER — cephALEXin (KEFLEX) 500 MG capsule
500 | ORAL_CAPSULE | Freq: Three times a day (TID) | ORAL | 0 refills | Status: AC
Start: 2017-07-23 — End: 2017-07-30

## 2017-07-23 MED ORDER — lidocaine 10 mg/mL (1 %) injection
10 | INTRAMUSCULAR | Status: AC
Start: 2017-07-23 — End: 2017-07-23
  Administered 2017-07-23: 06:00:00 5 via INTRADERMAL

## 2017-07-23 MED FILL — BACITRACIN-POLYMYXIN B 500 UNIT-10,000 UNIT/GRAM TOPICAL PACKET: 500-10000 500-10,000 unit/gram | TOPICAL | Qty: 1

## 2017-07-23 MED FILL — BOOSTRIX TDAP 2.5 LF UNIT-8 MCG-5 LF/0.5 ML INTRAMUSCULAR SYRINGE: 2.5-8-5 2.5-8-5 Lf-mcg-Lf/0.5mL | INTRAMUSCULAR | Qty: 0.5

## 2017-07-23 MED FILL — LIDOCAINE HCL 10 MG/ML (1 %) INJECTION SOLUTION: 10 10 mg/mL (1 %) | INTRAMUSCULAR | Qty: 20

## 2017-07-23 MED FILL — SODIUM CHLORIDE 0.9 % IRRIGATION SOLUTION: 0.9 0.9 % | Qty: 500

## 2017-07-23 NOTE — ED Notes (Signed)
Patient discharged to home in good condition. Patient given prescriptions for Keflex and Tylenol-codeine, advised regarding safe use especially regarding tylenol dosing for adults (4g in 24 hours). Patient told to follow up with his primary care physician or return to the ED to have sutures removed in 7 days. Patient agrees and denies having further needs upon leaving the ED. Bacitracin dressing applied to finger.

## 2017-07-23 NOTE — ED Triage Notes (Signed)
Pt has laceration on left first finger from chain saw at 1600 today. Pt states it is still bleeding when he takes off bandage. Pt is on eliquis

## 2017-07-23 NOTE — Unmapped (Addendum)
[facility] ED Note    Date of service:  07/23/2017    Reason for Visit: Laceration      Patient History     HPI [name] is a 77 year old male who presented to the emergency department for evaluation of left index finger laceration.  The patient sustained a laceration to the left index finger with a chainsaw and presents for evaluation.  The patient has no acute leg medicine injury noted he has no deep tissue injury.  The patient has a linear laceration with a partial skin avulsion of the skin from the chainsaw       Past Medical History:   Diagnosis Date   ??? Atrial fibrillation (CMS Dx)     corrected with Mini-maze procedure   ??? Cataract     left eye   ??? Diabetes mellitus (CMS Dx)     type 2   ??? Dilated cardiomyopathy (CMS Dx)    ??? Hearing loss     left ear   ??? Hypercholesteremia    ??? Hypertension    ??? Sleep apnea    ??? Thyroid disease     hypothyroidism       Past Surgical History:   Procedure Laterality Date   ??? COLONOSCOPY W/ POLYPECTOMY      benign  has at 3 plus procedures   ??? HERNIA REPAIR  9/09    right inguinal, mini maze   ??? HERNIA REPAIR  2/10    herniated lungs bilateral (repaired with mesh)   ??? mini-maze      corrected his A-fib   ??? SEPTOPLASTY     ??? UVULOPALATOPHARYNGOPLASTY     ??? UVULOPALATOPHARYNGOPLASTY      has had esophageal dilations in the past (Dr.Decter)   ??? WISDOM TOOTH EXTRACTION         Patient  reports that he has never smoked. He has never used smokeless tobacco. He reports that he does not drink alcohol or use drugs.      Previous Medications    APIXABAN (ELIQUIS) 5 MG TAB    Take 5 mg by mouth 2 times a day.    ASPIRIN 81 MG EC TABLET    Take 81 mg by mouth daily.    ATORVASTATIN (LIPITOR) 10 MG TABLET    Take 10 mg by mouth at bedtime.     CARVEDILOL (COREG) 25 MG TABLET    Take 25 mg by mouth 2 (two) times daily.      CHORIONIC GONADOTROPIN, HUMAN 10,000 UNIT INJECTION    Inject 10,000 Units into the muscle once.    GLIMEPIRIDE  (AMARYL) 2 MG TABLET    Take 1 tablet by mouth  every morning before  breakfast    GLUC/CHON-MSM#1/C/MANG/BOS/BOR (OSTEO BI-FLEX ORAL)    Take by mouth. OSTEO BI-FLEX JOINT SHIELD TABS     GLYBURIDE-METFORMIN (GLUCOVANCE) 2.5-500 MG PER TABLET    Take 2 tablets by mouth 2 times a day.     LEVOTHYROXINE (SYNTHROID, LEVOTHROID) 100 MCG TABLET    Take 100 mcg by mouth every morning.     LOSARTAN-HYDROCHLOROTHIAZIDE (HYZAAR) 100-25 MG PER TABLET    Take 1 tablet by mouth daily.    OMEPRAZOLE (PRILOSEC) 40 MG CAPSULE    Take 40 mg by mouth every morning.     PROPAFENONE (RYTHMOL) 225 MG TABLET    Take 225 mg by mouth 3 times a day.    SAW PALMETTO XTR/ZINC PICOLIN (SAW PALMETTO EXTRACT ORAL)    Take  2 tablets by mouth 2 times a day.        Allergies:   Allergies as of 07/23/2017   ??? (No Known Allergies)     Family history has been reviewed in electronic medical record  Review of Systems     Review of Systems  Constitutional:  Denies fever or chills   Eyes:  Denies change in visual acuity   HENT:  Denies nasal congestion or sore throat   Respiratory:  Denies cough or shortness of breath   Cardiovascular:  Denies chest pain or edema   GI:  Denies abdominal pain, nausea, vomiting, bloody stools or diarrhea   GU:  Denies dysuria   Musculoskeletal:  See HPI  Integument:  Denies rash   Neurologic:  Denies headache, focal weakness or sensory changes   Endocrine:  Denies polyuria or polydipsia   Lymphatic:  Denies swollen glands   Psychiatric:  Denies depression or anxiety       Physical Exam     ED Triage Vitals [07/23/17 0145]   Vital Signs Group      Temp 97.6 ??F (36.4 ??C)      Temp Source Oral      Heart Rate 63      Heart Rate Source       Resp 18      SpO2 100 %      BP (!) 156/101      MAP (mmHg) 120      BP Location Right arm      BP Method Automatic      Patient Position Sitting   SpO2 100 %   O2 Device None (Room air)       Physical Exam   Nursing note and vitals reviewed.  Constitutional: He appears well-developed and  well-nourished. No distress.   Musculoskeletal: Normal range of motion. He exhibits tenderness. He exhibits no edema.   The patient on examination had full range of motion movement of left index finger.  The patient has no deep tissue ligamentous injury the patient has no focal motor or neuro deficits on examination.   Skin: Skin is warm and dry. No rash noted. He is not diaphoretic. No erythema. No pallor.         Diagnostic Studies     Labs:    Please see electronic medical record for any tests performed in the ED    Radiology:    Please see electronic medical record for any tests performed in the ED    EKG:    No EKG Performed    Emergency Department Procedures     Procedures laceration wound repair of left index finger.  The patient gave verbal consent.  The patient had digital block performed for anesthesia using bupivacaine.  The patient wound was cleansed and prepped and draped and wound was closed using 5-0 Ethilon suture.  The patient had a total of 6 sutures placed for wound closure    ED Course and MDM     [name] is a 77 y.o. male who presented to the emergency department with Laceration  The patient presented to the emergency department for evaluation of left index finger injury.  The patient was using a chainsaw and sustained laceration to his left index finger.  The patient linear laceration with partial skin avulsion over the medial aspect of his injury.  The patient the number department had tetanus immunization updated.  Digital block was performed.  The patient wound  Was irrigated  and repaired in linear fashion for wound closure and wound reapproximation.  The patient will be discharged on wound care discharge instructions and recommended suture removal in 7 days for outpatient follow-up care as stated above.           Critical Care Time (Attendings)   None    Final Impression is Laceration of left index finger    Disposition is Discharge to home       Cherly Anderson, MD  07/25/17 0272        Cherly Anderson, MD  07/25/17 [phone]

## 2017-07-29 ENCOUNTER — Inpatient Hospital Stay: Admit: 2017-07-29 | Discharge: 2017-07-29 | Disposition: A | Discharge status: Home / Self Care | Payer: MEDICARE

## 2017-07-29 DIAGNOSIS — S61211D Laceration without foreign body of left index finger without damage to nail, subsequent encounter: Secondary | ICD-10-CM

## 2017-07-29 NOTE — ED Triage Notes (Signed)
Pt needing suture removal on L index finger.

## 2017-07-29 NOTE — ED Provider Notes (Signed)
[facility] ED Note    07/29/2017    Patient History     HPI: [name] is a 77 y.o. male who presents with Suture / Staple Removal  .  6 sutures placed after laceration 7 days ago.  Here for suture removal.  Denies drainage or redness or increased pain.         Past Medical History:   Diagnosis Date    Atrial fibrillation (CMS Dx)     corrected with Mini-maze procedure    Cataract     left eye    Diabetes mellitus (CMS Dx)     type 2    Dilated cardiomyopathy (CMS Dx)     Hearing loss     left ear    Hypercholesteremia     Hypertension     Sleep apnea     Thyroid disease     hypothyroidism       Past Surgical History:   Procedure Laterality Date    COLONOSCOPY W/ POLYPECTOMY      benign  has at 3 plus procedures    HERNIA REPAIR  9/09    right inguinal, mini maze    HERNIA REPAIR  2/10    herniated lungs bilateral (repaired with mesh)    mini-maze      corrected his A-fib    SEPTOPLASTY      UVULOPALATOPHARYNGOPLASTY      UVULOPALATOPHARYNGOPLASTY      has had esophageal dilations in the past (Dr.Decter)    WISDOM TOOTH EXTRACTION          reports that he has never smoked. He has never used smokeless tobacco. He reports that he does not drink alcohol or use drugs.    Previous Medications    APIXABAN (ELIQUIS) 5 MG TAB    Take 5 mg by mouth 2 times a day.    ASPIRIN 81 MG EC TABLET    Take 81 mg by mouth daily.    ATORVASTATIN (LIPITOR) 10 MG TABLET    Take 10 mg by mouth at bedtime.     CARVEDILOL (COREG) 25 MG TABLET    Take 25 mg by mouth 2 (two) times daily.      CEPHALEXIN (KEFLEX) 500 MG CAPSULE    Take 1 capsule (500 mg total) by mouth 3 times a day for 7 days.    CHORIONIC GONADOTROPIN, HUMAN 10,000 UNIT INJECTION    Inject 10,000 Units into the muscle once.    GLIMEPIRIDE (AMARYL) 2 MG TABLET    Take 1 tablet by mouth  every morning before  breakfast    GLUC/CHON-MSM#1/C/MANG/BOS/BOR (OSTEO BI-FLEX ORAL)    Take by mouth. OSTEO BI-FLEX  JOINT SHIELD TABS     GLYBURIDE-METFORMIN (GLUCOVANCE) 2.5-500 MG PER TABLET    Take 2 tablets by mouth 2 times a day.     LEVOTHYROXINE (SYNTHROID, LEVOTHROID) 100 MCG TABLET    Take 100 mcg by mouth every morning.     LOSARTAN-HYDROCHLOROTHIAZIDE (HYZAAR) 100-25 MG PER TABLET    Take 1 tablet by mouth daily.    OMEPRAZOLE (PRILOSEC) 40 MG CAPSULE    Take 40 mg by mouth every morning.     PROPAFENONE (RYTHMOL) 225 MG TABLET    Take 225 mg by mouth 3 times a day.    SAW PALMETTO XTR/ZINC PICOLIN (SAW PALMETTO EXTRACT ORAL)    Take 2 tablets by mouth 2 times a day.        Allergies:   Allergies as  of 07/29/2017    (No Known Allergies)       Review of Systems     ROS: See HPI for pertinent positives  All other ROS were negative.    Physical Exam     ED Triage Vitals [07/29/17 1929]   Vital Signs Group      Temp 98 F (36.7 C)      Temp Source Oral      Heart Rate 66      Heart Rate Source Monitor      Resp 16      SpO2 100 %      BP 146/67      MAP (mmHg)       BP Location Right arm      BP Method Automatic      Patient Position Sitting   SpO2 100 %   O2 Device None (Room air)     Vitals:    07/29/17 1929   BP: 146/67   BP Location: Right arm   Patient Position: Sitting   Pulse: 66   Resp: 16   Temp: 98 F (36.7 C)   TempSrc: Oral   SpO2: 100%   Weight: (!) 240 lb (108.9 kg)   Height: 6' 1 (1.854 m)      Temp Readings from Last 2 Encounters:   07/29/17 98 F (36.7 C) (Oral)   07/23/17 97.6 F (36.4 C) (Oral)     BP Readings from Last 2 Encounters:   07/29/17 146/67   07/23/17 135/74     Pulse Readings from Last 2 Encounters:   07/29/17 66   07/23/17 60        Constitutional:  Well-appearing, no acute distress, non-toxic appearance   Musculoskeletal:  Sutures 6 to left index finger distal aspect, no spreading redness, ranges without discomfort.  Skin:  No rash or nodules noted.   Neurologic:  Awake and alert.  Moves all four extremities.  Light touch intact.    Psychiatric:  Normal mood.  Behavior  appropriate.      Diagnostic Studies     Labs:      Labs Reviewed - No data to display    Radiology:  None      EKG interpretation: None performed    Emergency Department Procedures         ED Course and MDM     EMMERT NICHTER is a 77 y.o. male who presented to the emergency department with suture removal without immediate complication.  Discharge home with return precautions for spreading redness increased pain drainage or further concerns.    Meds given in ED or prescribed for discharge:  Medications - No data to display    Clinical Impression:  1. Encounter for removal of sutures        Patient Referred to:    [name], MD  [address] [facility] [address]  [phone]            Future Appointments  Date Time Provider Department Center   04/23/2018 3:20 PM Donney Dice, CNP [facility]       Discharge Medications:  New Prescriptions    No medications on file             Critical Care Time (Attendings)        Vicci Reder, MD  07/29/17 [phone]

## 2017-08-22 NOTE — Telephone Encounter (Signed)
LM for pt to call and schedule AWV.

## 2017-09-06 ENCOUNTER — Encounter

## 2017-09-06 ENCOUNTER — Ambulatory Visit: Admit: 2017-09-06 | Discharge: 2017-09-06 | Payer: MEDICARE | Attending: Internal Medicine | Primary: Internal Medicine

## 2017-09-06 DIAGNOSIS — Z01818 Encounter for other preprocedural examination: Secondary | ICD-10-CM

## 2017-09-06 NOTE — Assessment & Plan Note (Signed)
On rhythmol and eliquis.   No chest pain or sob or dizziness or palpitations. Afib is rate controlled, and will continue to titrate coumadin with a goal of a stable therapeutic INR of 2-3. Continue current medications for rate control. Will check monitoring labs as necessary.   For the surgery hold eliquis 2 days prior to surgery and restart a day after.

## 2017-09-06 NOTE — Assessment & Plan Note (Signed)
Taking coreg, hyzaar and rhythmol,   Patient is compliant w medications, no side effects, effective, provides adequate symptom relief. No new symptoms or problems as noted by patient.  The problem is stable, no changes noted by patient. Will consider monitoring labs and refill medications as appropriate. Patient counseled and will continue current plan.

## 2017-09-06 NOTE — Assessment & Plan Note (Signed)
This has been a chronic problem, takes meds regularly. Monitors diet and tries to follow a low fat diet. Not been very compliant w exercise. Lipids have been stable, The problem is controlled. Recent lipid tests were reviewed and are normal. Pertinent negatives include no chest pain, focal sensory loss, focal weakness, leg pain, myalgias or shortness of breath.  Advised patient to continue the current instructions or medications.

## 2017-09-06 NOTE — Assessment & Plan Note (Signed)
Taking metformin, januvia,   Patient is compliant w medications, no side effects, effective, provides adequate symptom relief. No new symptoms or problems as noted by patient.  The problem is stable, no changes noted by patient. Will consider monitoring labs and refill medications as appropriate. Patient counseled and will continue current plan.

## 2017-09-06 NOTE — Progress Notes (Signed)
Subjective:        Reason for visit.   Carl Blair is a 77 y.o. male who presents for a preoperative physical examination.  He is scheduled to have bilateral ptosis repair done by Dr. Martyn Malay at [facility] on 09/25/17.    History of Present Illness:      Here for a pre op eval for the above surgery,  No acute complaints,   No acute illness,   Ptosis of both eyelids  Here for a pre op eval for bilateral ptosis repair,  He is medically stable.    Pure hypercholesterolemia  This has been a chronic problem, takes meds regularly. Monitors diet and tries to follow a low fat diet. Not been very compliant w exercise. Lipids have been stable, The problem is controlled. Recent lipid tests were reviewed and are normal. Pertinent negatives include no chest pain, focal sensory loss, focal weakness, leg pain, myalgias or shortness of breath.  Advised patient to continue the current instructions or medications.      Type 2 diabetes mellitus without complication, without long-term current use of insulin (HCC)  Taking metformin, januvia,   Patient is compliant w medications, no side effects, effective, provides adequate symptom relief. No new symptoms or problems as noted by patient.  The problem is stable, no changes noted by patient. Will consider monitoring labs and refill medications as appropriate. Patient counseled and will continue current plan.     Essential hypertension  This is a chronic problem. The problem is well controlled.  Patient monitors readings regularly. Pertinent negatives include no chest pain, focal sensory loss, focal weakness, leg pain, myalgias or shortness of breath. No headaches or chest pain. Takes medications regularly.  Blood pressure has been stable, blood work was reviewed, and advised patient to continue the current instructions or medications.      Chronic systolic congestive heart failure (HCC)  Taking coreg, hyzaar and rhythmol,   Patient is compliant w medications, no side effects, effective,  provides adequate symptom relief. No new symptoms or problems as noted by patient.  The problem is stable, no changes noted by patient. Will consider monitoring labs and refill medications as appropriate. Patient counseled and will continue current plan.     Acquired hypothyroidism  Taking synthroid,  Patient is compliant w medications, no side effects, effective, provides adequate symptom relief. No new symptoms or problems as noted by patient.  The problem is stable, no changes noted by patient. Will consider monitoring labs and refill medications as appropriate. Patient counseled and will continue current plan.     Chronic a-fib (HCC)  On rhythmol and eliquis.   No chest pain or sob or dizziness or palpitations. Afib is rate controlled, and will continue to titrate coumadin with a goal of a stable therapeutic INR of 2-3. Continue current medications for rate control. Will check monitoring labs as necessary.   For the surgery hold eliquis 2 days prior to surgery and restart a day after.       Past Medical History:   Diagnosis Date   ??? Atrial fibrillation (HCC)    ??? Cardiomyopathy    ??? Chronic a-fib (HCC) 09/06/2017   ??? Hyperlipidemia    ??? Hypertension    ??? Hypothyroidism    ??? 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 Type II or unspecified type diabetes mellitus without mention of complication, not stated as uncontrolled    ??? Unspecified sleep apnea       No  previous  anesthesia complications.       Past Surgical History:   Procedure Laterality Date   ??? CARDIAC SURGERY  2009    mini maze   ??? CARPAL TUNNEL RELEASE Left 2016   ??? HERNIA REPAIR      Right   ??? SEPTOPLASTY     ??? UVULOPALATOPHARYGOPLASTY                                                     Current Outpatient Medications   Medication Sig Dispense Refill   ??? losartan-hydrochlorothiazide (HYZAAR) 100-25 MG per tablet Take 1 tablet by mouth daily 90 tablet 1   ??? omeprazole (PRILOSEC) 40 MG delayed release capsule TAKE 1 CAPSULE BY MOUTH  DAILY 90 capsule 1   ???  blood glucose test strips (ACCU-CHEK AVIVA PLUS) strip TEST BLOOD SUGAR ONCE DAILY dx E11.9 100 strip 1   ??? atorvastatin (LIPITOR) 20 MG tablet Take 1 tablet by mouth daily TAKE 1 TABLET BY MOUTH  DAILY 90 tablet 1   ??? propafenone (RYTHMOL) 300 MG tablet Take 1 tablet by mouth 3 times daily     ??? carvedilol (COREG) 25 MG tablet TAKE 1 TABLET BY MOUTH TWO  TIMES DAILY 180 tablet 1   ??? metFORMIN (GLUCOPHAGE) 1000 MG tablet TAKE 1 TABLET BY MOUTH  TWICE A DAY WITH MEALS 180 tablet 1   ??? JANUVIA 100 MG tablet TAKE 1 TABLET BY MOUTH  DAILY 90 tablet 1   ??? levothyroxine (SYNTHROID) 100 MCG tablet TAKE 1 TABLET BY MOUTH  DAILY 90 tablet 1   ??? Misc Natural Products (OSTEO BI-FLEX JOINT SHIELD PO) Take by mouth daily     ??? apixaban (ELIQUIS) 5 MG TABS tablet Take by mouth 2 times daily     ??? oxybutynin (DITROPAN-XL) 10 MG extended release tablet Take 10 mg by mouth daily      ??? ACCU-CHEK FASTCLIX LANCETS MISC Test once daily and prn dx 250.00 102 each 1   ??? Blood Glucose Monitoring Suppl DEVI by Does not apply route. Diagnosis   250.00 1 Device 0   ??? SAW PALMETTO Take  by mouth daily. Indications: XRT     ??? aspirin 81 MG EC tablet      ??? glimepiride (AMARYL) 2 MG tablet TAKE 1 TABLET BY MOUTH TWO  TIMES DAILY WITH MEALS 180 tablet 1     No current facility-administered medications for this visit.        No Known Allergies    Social History     Tobacco Use   ??? Smoking status: Never Smoker   ??? Smokeless tobacco: Never Used   Substance Use Topics   ??? Alcohol use: Yes     Comment: rare   ??? Drug use: No        Family History   Problem Relation Age of Onset   ??? Diabetes Mother    ??? Hypertension Mother    ??? Diabetes Sister    ??? Coronary Art Dis Sister    ??? Diabetes Brother    ??? Coronary Art Dis Brother    ??? Hypertension Brother    ??? Asthma Sister         Review Of Systems    Skin: no abnormal pigmentation, rash, scaling, itching, masses, hair or nail changes  Eyes: negative  Ears/Nose/Throat: negative  Respiratory:  negative  Cardiovascular: negative  Gastrointestinal: negative  Genitourinary: negative  Musculoskeletal: negative  Neurologic: negative  Psychiatric: negative  Hematologic/Lymphatic/Immunologic: negative  Endocrine: negative       Objective:      BP 126/80 (Site: Right Upper Arm, Position: Sitting, Cuff Size: Large Adult)    Pulse 60    Temp 97.6 ??F (36.4 ??C) (Oral)    Resp 16    Ht 6' (1.829 m)    Wt 235 lb (106.6 kg)    SpO2 98%    BMI 31.87 kg/m??   General appearance - healthy, alert, no distress  Skin - Skin color, texture, turgor normal. No rashes or lesions.  Head - Normocephalic. No masses, lesions, tenderness or abnormalities  Eyes - conjunctivae/corneas clear. PERRL, EOM's intact.  Ears - External ears normal. Canals clear. TM's normal.  Nose/Sinuses - Nares normal. Septum midline. Mucosa normal. No drainage or sinus tenderness.  Oropharynx - Lips, mucosa, and tongue normal. Teeth and gums normal. Oropharynx pink and patent  Neck - Neck supple. No adenopathy. Thyroid symmetric, normal size,  Back - Back symmetric, no curvature. ROM normal. No CVA tenderness.  Lungs - Percussion normal. Good diaphragmatic excursion. Lungs clear  Heart - Regular rate and rhythm, with no rub, murmur or gallop noted.  Abdomen - Abdomen soft, non-tender. BS normal. No masses, organomegaly  Extremities - Extremities normal. No deformities, edema, or skin discolora  Musculoskeletal - Spine ROM normal. Muscular strength intact.  Peripheral pulses - radial=2+,, femoral=2+, popliteal=2+, dorsalis pedis=2+,  Neuro - Gait normal. Reflexes normal and symmetric. Sensation grossly normal.  No focal weakness    EKG: not needed.  BLOOD WORK: not needed.     Assessment:      Pre op eval  Ptosis of both eyelids  Here for a pre op eval for bilateral ptosis repair,  He is medically stable.    Pure hypercholesterolemia  This has been a chronic problem, takes meds regularly. Monitors diet and tries to follow a low fat diet. Not been very  compliant w exercise. Lipids have been stable, The problem is controlled. Recent lipid tests were reviewed and are normal. Pertinent negatives include no chest pain, focal sensory loss, focal weakness, leg pain, myalgias or shortness of breath.  Advised patient to continue the current instructions or medications.      Type 2 diabetes mellitus without complication, without long-term current use of insulin (HCC)  Taking metformin, januvia,   Patient is compliant w medications, no side effects, effective, provides adequate symptom relief. No new symptoms or problems as noted by patient.  The problem is stable, no changes noted by patient. Will consider monitoring labs and refill medications as appropriate. Patient counseled and will continue current plan.     Essential hypertension  This is a chronic problem. The problem is well controlled.  Patient monitors readings regularly. Pertinent negatives include no chest pain, focal sensory loss, focal weakness, leg pain, myalgias or shortness of breath. No headaches or chest pain. Takes medications regularly.  Blood pressure has been stable, blood work was reviewed, and advised patient to continue the current instructions or medications.      Chronic systolic congestive heart failure (HCC)  Taking coreg, hyzaar and rhythmol,   Patient is compliant w medications, no side effects, effective, provides adequate symptom relief. No new symptoms or problems as noted by patient.  The problem is stable, no changes noted by patient. Will consider monitoring labs and refill medications  as appropriate. Patient counseled and will continue current plan.     Acquired hypothyroidism  Taking synthroid,  Patient is compliant w medications, no side effects, effective, provides adequate symptom relief. No new symptoms or problems as noted by patient.  The problem is stable, no changes noted by patient. Will consider monitoring labs and refill medications as appropriate. Patient counseled and  will continue current plan.     Chronic a-fib (HCC)  On rhythmol and eliquis.   No chest pain or sob or dizziness or palpitations. Afib is rate controlled, and will continue to titrate coumadin with a goal of a stable therapeutic INR of 2-3. Continue current medications for rate control. Will check monitoring labs as necessary.   For the surgery hold eliquis 2 days prior to surgery and restart a day after.                 Plan:        He is medically cleared for surgery and anesthesia.  Per [employer] consultant.  See also orders filed with this encounter, if any.  Instructions to patient: Do not take any NSAIDS 1 week prior to surgery. Hold eliquis 2 days prior to the surgery and restart a day after surgery.   Indication for peri-operative beta blocker therapy: N/A      Marcello Fennel, MD   09/06/2017 11:37 AM

## 2017-09-06 NOTE — Assessment & Plan Note (Signed)
This is a chronic problem. The problem is well controlled.  Patient monitors readings regularly. Pertinent negatives include no chest pain, focal sensory loss, focal weakness, leg pain, myalgias or shortness of breath. No headaches or chest pain. Takes medications regularly.  Blood pressure has been stable, blood work was reviewed, and advised patient to continue the current instructions or medications.

## 2017-09-06 NOTE — Assessment & Plan Note (Signed)
Here for a pre op eval for bilateral ptosis repair,  He is medically stable.

## 2017-09-06 NOTE — Assessment & Plan Note (Signed)
Taking synthroid,  Patient is compliant w medications, no side effects, effective, provides adequate symptom relief. No new symptoms or problems as noted by patient.  The problem is stable, no changes noted by patient. Will consider monitoring labs and refill medications as appropriate. Patient counseled and will continue current plan.

## 2017-09-07 LAB — CBC WITH AUTO DIFFERENTIAL
Basophils %: 0.6 %
Basophils Absolute: 0 10*3/uL (ref 0.0–0.2)
Eosinophils %: 2.5 %
Eosinophils Absolute: 0.2 10*3/uL (ref 0.0–0.6)
Hematocrit: 35.9 % — ABNORMAL LOW (ref 40.5–52.5)
Hemoglobin: 12.1 g/dL — ABNORMAL LOW (ref 13.5–17.5)
Lymphocytes %: 38.5 %
Lymphocytes Absolute: 3.1 10*3/uL (ref 1.0–5.1)
MCH: 28.7 pg (ref 26.0–34.0)
MCHC: 33.6 g/dL (ref 31.0–36.0)
MCV: 85.4 fL (ref 80.0–100.0)
MPV: 7.1 fL (ref 5.0–10.5)
Monocytes %: 8.4 %
Monocytes Absolute: 0.7 10*3/uL (ref 0.0–1.3)
Neutrophils %: 50 %
Neutrophils Absolute: 4.1 10*3/uL (ref 1.7–7.7)
Platelets: 198 10*3/uL (ref 135–450)
RBC: 4.21 M/uL (ref 4.20–5.90)
RDW: 15.5 % — ABNORMAL HIGH (ref 12.4–15.4)
WBC: 8.1 10*3/uL (ref 4.0–11.0)

## 2017-09-07 LAB — COMPREHENSIVE METABOLIC PANEL
ALT: 13 U/L (ref 10–40)
AST: 14 U/L — ABNORMAL LOW (ref 15–37)
Albumin/Globulin Ratio: 2.2 (ref 1.1–2.2)
Albumin: 4.4 g/dL (ref 3.4–5.0)
Alkaline Phosphatase: 80 U/L (ref 40–129)
Anion Gap: 12 (ref 3–16)
BUN: 24 mg/dL — ABNORMAL HIGH (ref 7–20)
CO2: 27 mmol/L (ref 21–32)
Calcium: 9.3 mg/dL (ref 8.3–10.6)
Chloride: 104 mmol/L (ref 99–110)
Creatinine: 1.1 mg/dL (ref 0.8–1.3)
GFR African American: >60 (ref >60)
GFR Non-African American: >60 (ref >60)
Globulin: 2 g/dL
Glucose: 113 mg/dL — ABNORMAL HIGH (ref 70–99)
Potassium: 4.5 mmol/L (ref 3.5–5.1)
Sodium: 143 mmol/L (ref 136–145)
Total Bilirubin: 0.4 mg/dL (ref 0.0–1.0)
Total Protein: 6.4 g/dL (ref 6.4–8.2)

## 2017-09-07 LAB — LIPID PANEL
Cholesterol, Total: 124 mg/dL (ref 0–199)
HDL: 31 mg/dL — ABNORMAL LOW (ref 40–60)
LDL Calculated: 76 mg/dL (ref <100)
Triglycerides: 83 mg/dL (ref 0–150)
VLDL Cholesterol Calculated: 17 mg/dL

## 2017-09-07 LAB — HEMOGLOBIN A1C
Hemoglobin A1C: 6.1 %
eAG: 128.4 mg/dL

## 2017-09-07 LAB — TSH: TSH: 2.36 u[IU]/mL (ref 0.27–4.20)

## 2017-09-07 LAB — PSA SCREENING: PSA: 0.57 ng/mL (ref 0.00–4.00)

## 2017-11-24 IMAGING — US DOP CAROTID BILATERAL
1 series · 14 of 24 positions shown · non-contrast
Comparison: None

HISTORY: Symptoms and signs involving the circulatory and respiratory systems
TECHNIQUE: Gray-scale color Doppler and spectral Doppler imaging of the bilateral carotid and vertebral arteries is performed.

[Series 1: dop carotid bilateral · 14 of 53 slices shown]
[im 1/53]
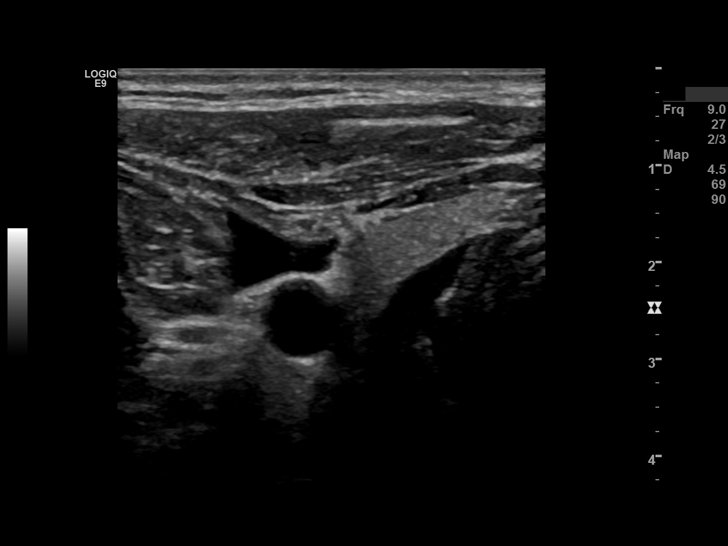
[im 5/53]
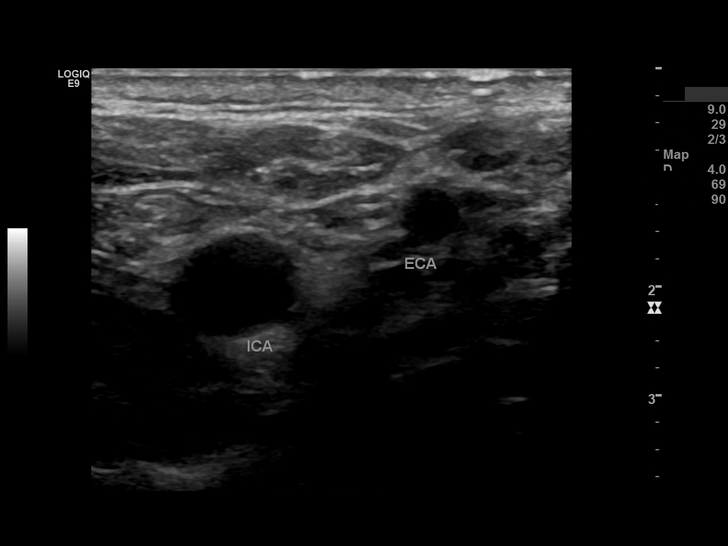
[im 10/53]
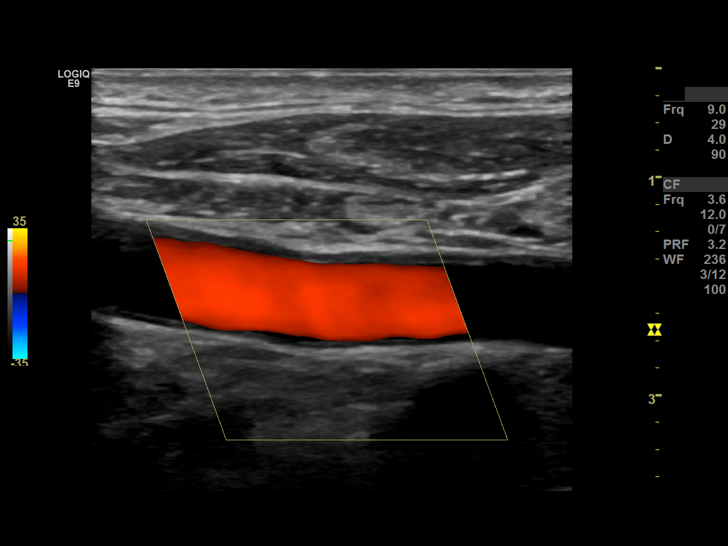
[im 14/53]
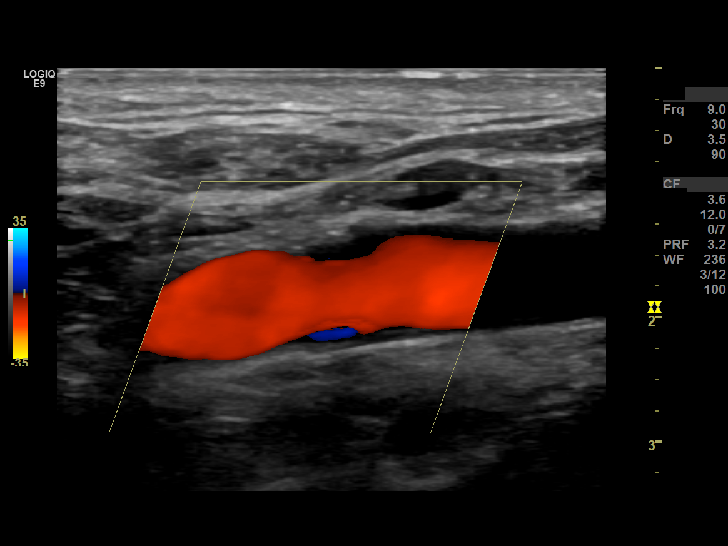
[im 16/53]
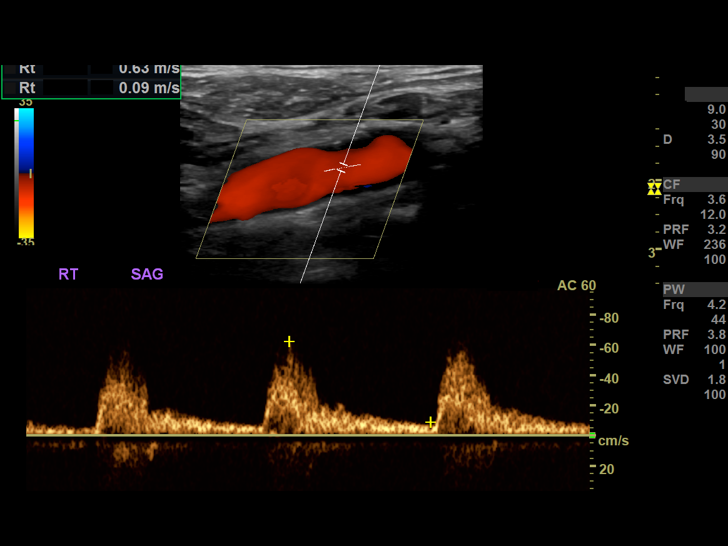
[im 21/53]
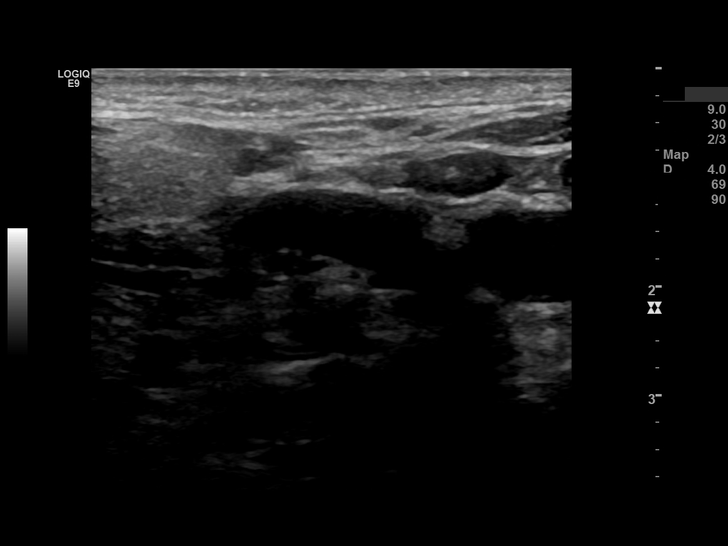
[im 25/53]
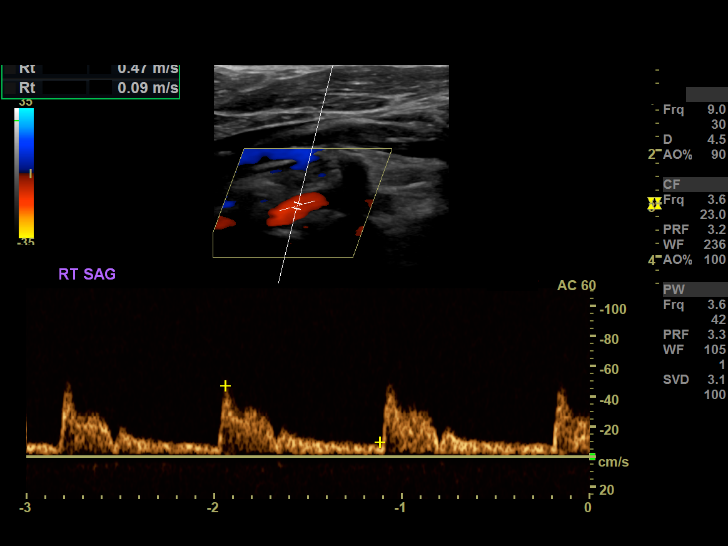
[im 28/53]
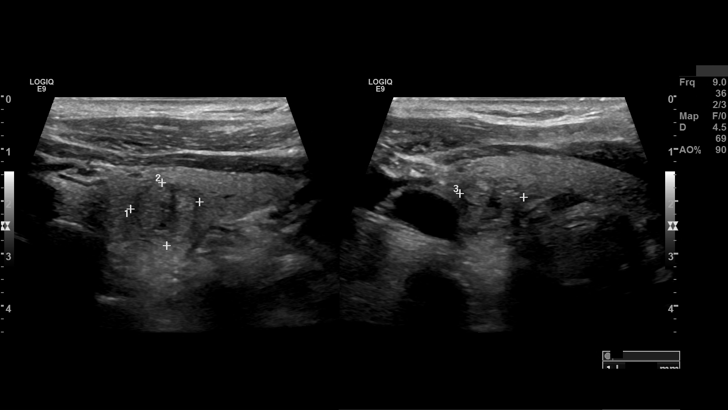
[im 32/53]
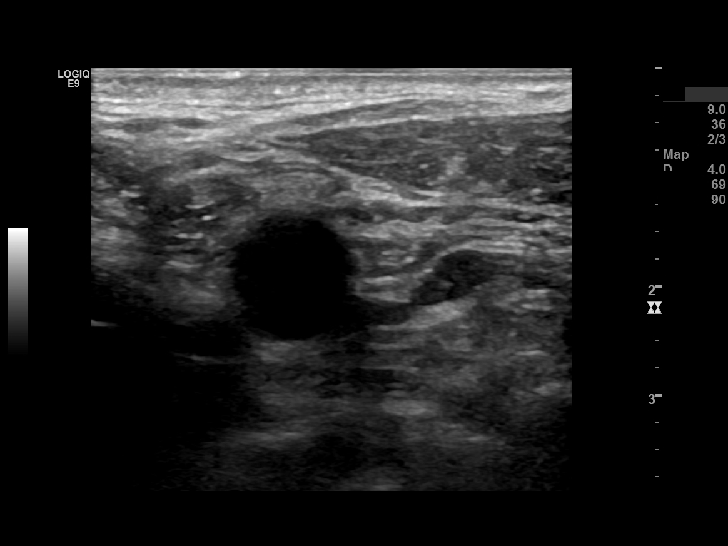
[im 37/53]
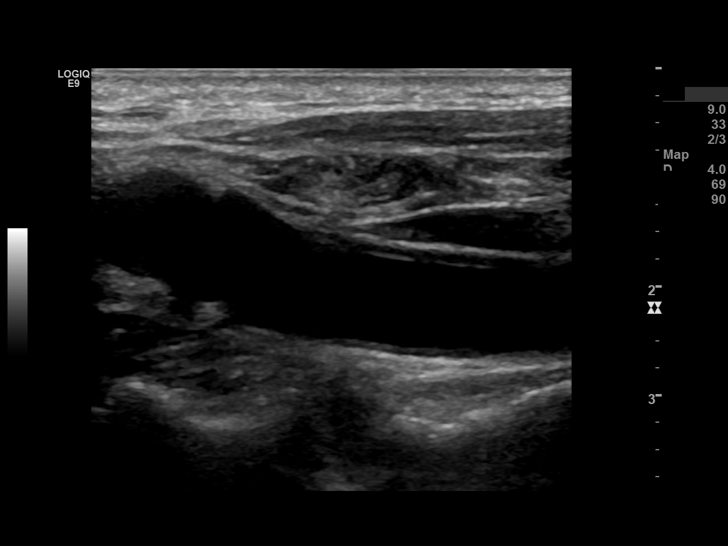
[im 41/53]
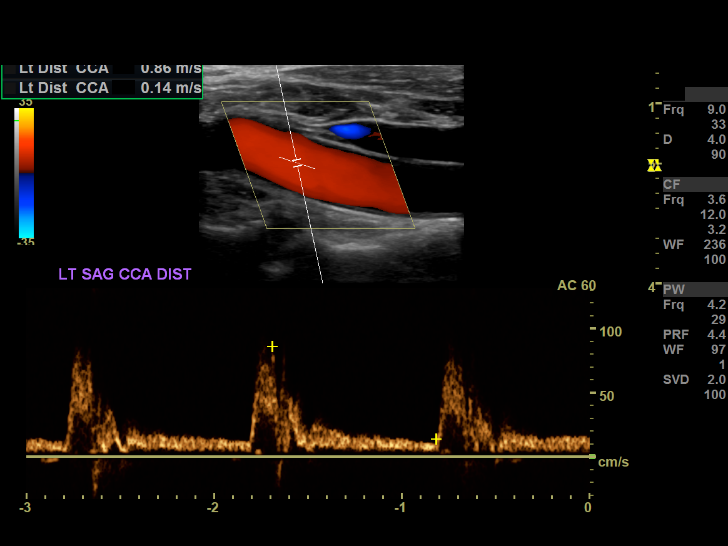
[im 43/53]
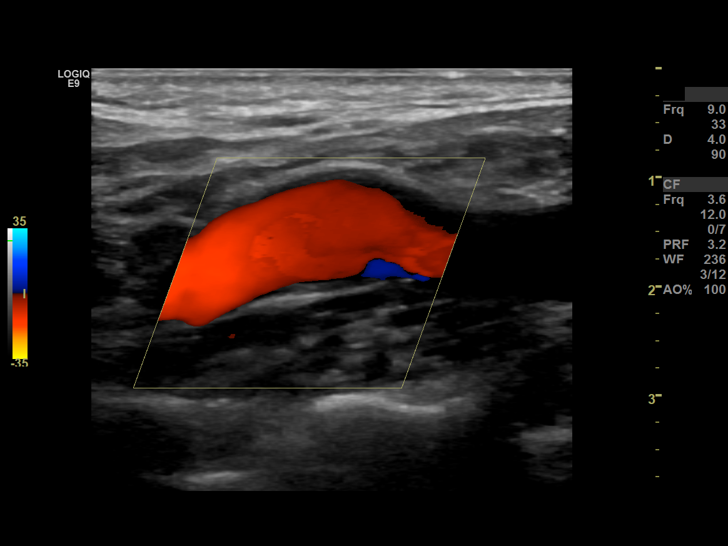
[im 48/53]
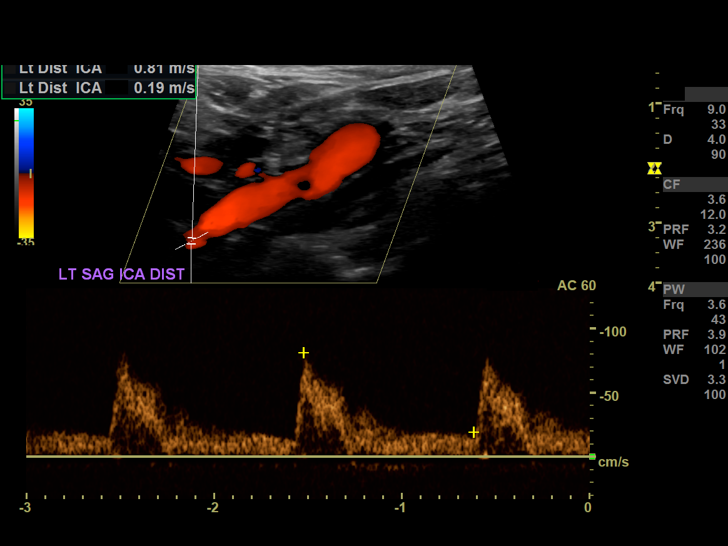
[im 53/53]
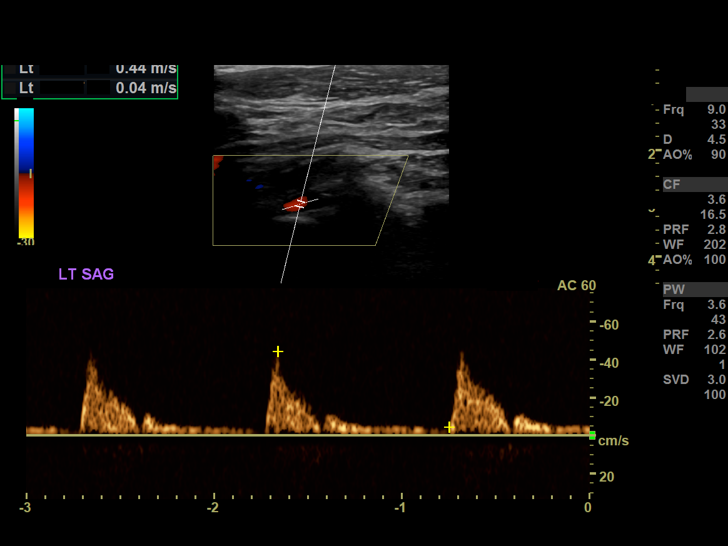

[14 of 24 positions shown; findings below may reference images not displayed]

FINDINGS: RIGHT CAROTID:

Gray-scale soft plaque is present.

Peak systolic flow velocity of the CCA is 1.04 m/s, ICA is 0.87 m/s and ECA is 1.0 m/s.

ICA/CCA velocity ratio is 0.83. 

Antegrade blood flow is seen in the right vertebral region.

As an incidental finding there is a solid mass in the right lobe of the thyroid 13 x 12 x 12 mm appears isoechoic.

LEFT CAROTID: 

Gray-scale soft plaque is present

Peak systolic flow velocity of the CCA is 1.63 m/s, ICA is 0.82 m/s and ECA is 1.0 m/s.

ICA/CCA velocity ratio is 1.63.

Antegrade blood flow is seen in the left vertebral region.
IMPRESSION: 1.  Heart rate and rhythm is regular.

2.  Gray-scale soft plaque is present.

3. No hemodynamically significant stenosis of the carotid arteries is present.

4. As an incidental finding there is a 13 x 12 x 12 mm solid mass in the right lobe of the thyroid. Depending on clinical circumstances thyroid ultrasound may be appropriate.

Stenosis velocity criteria are extrapolated from diameter data as defined by the Society of Radiologists in Ultrasound Consensus Conference Radiology 3220; 229; 304-346

## 2017-11-28 IMAGING — US US THYROID
1 series · 14 of 25 positions shown · non-contrast
Comparison: None

HISTORY: Mass
TECHNIQUE: US THYROID

[Series 2: us thyroid · 14 of 26 slices shown]
[im 1/26]
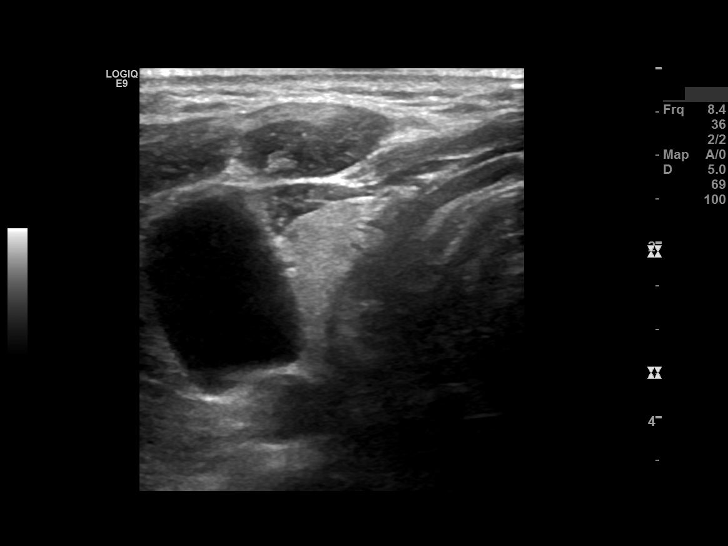
[im 3/26]
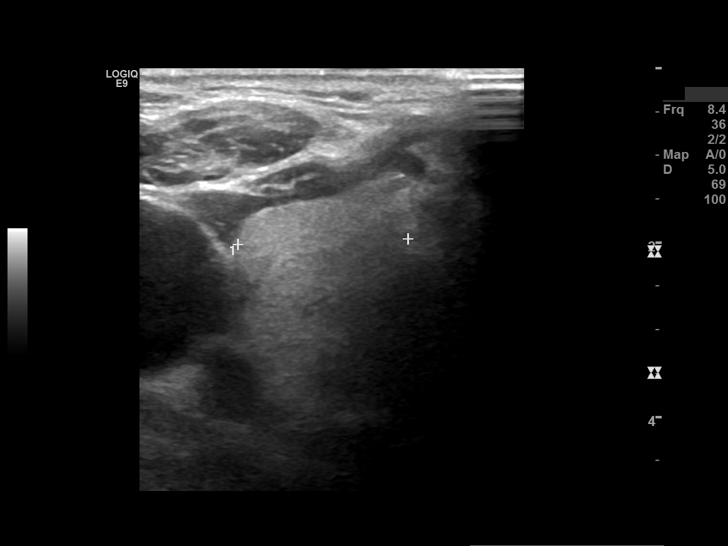
[im 5/26]
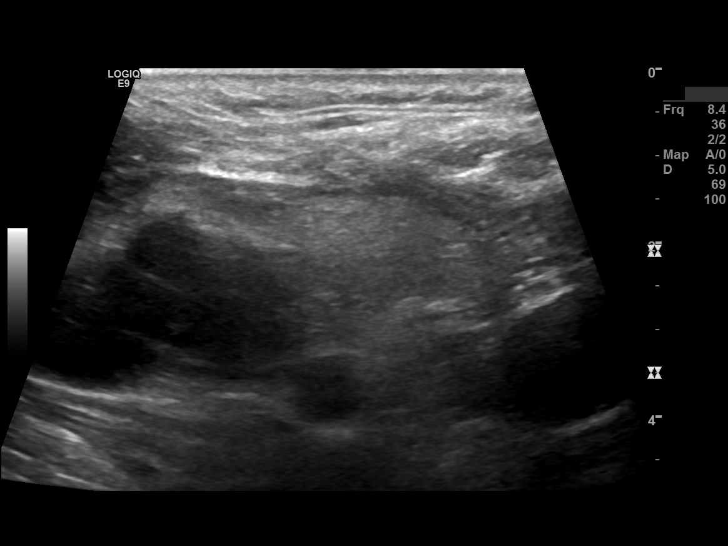
[im 7/26]
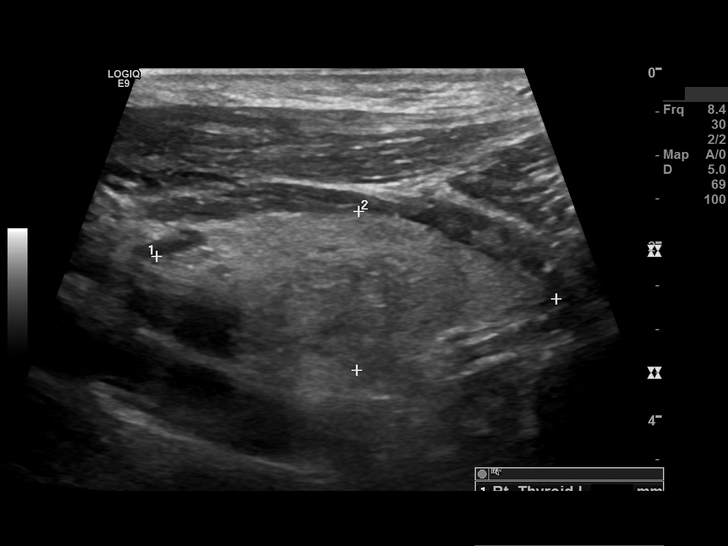
[im 9/26]
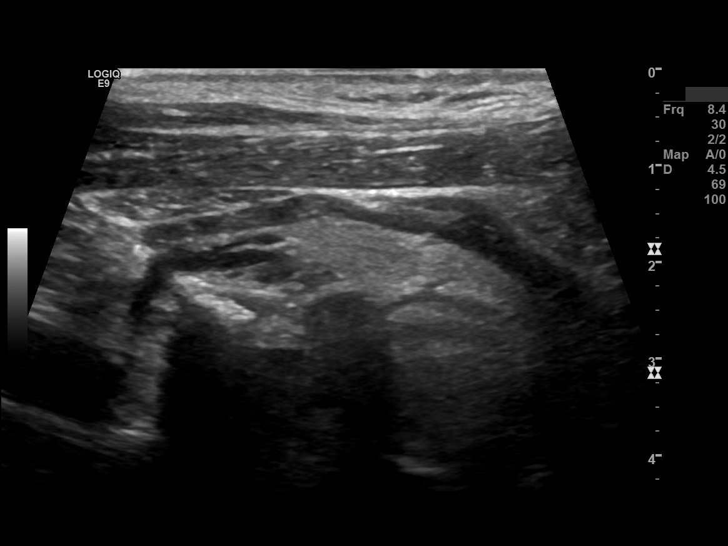
[im 10/26]
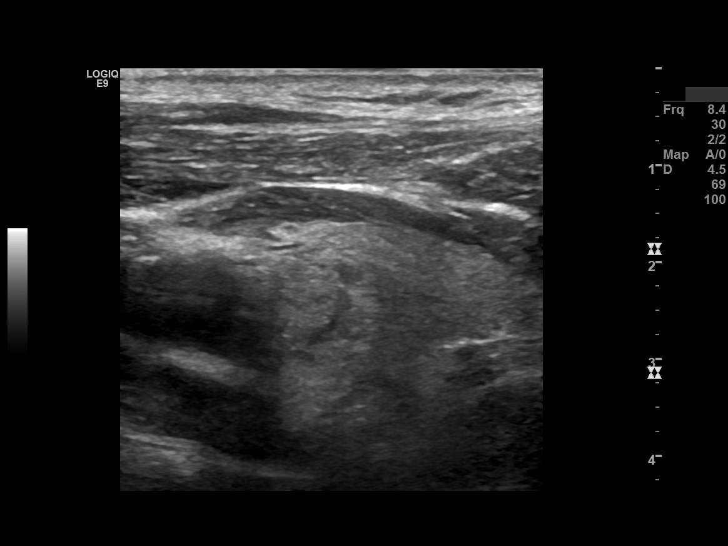
[im 12/26]
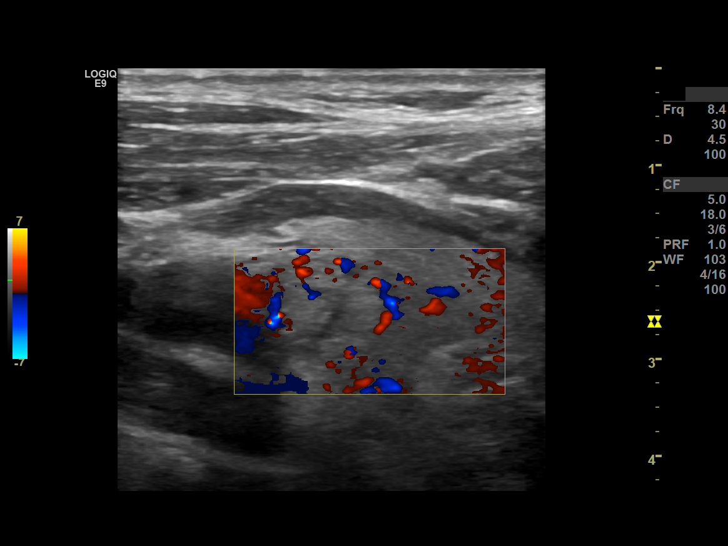
[im 14/26]
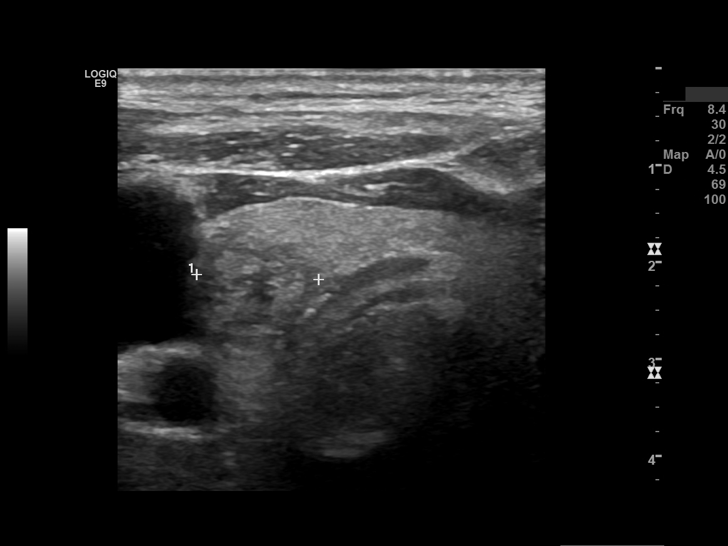
[im 16/26]
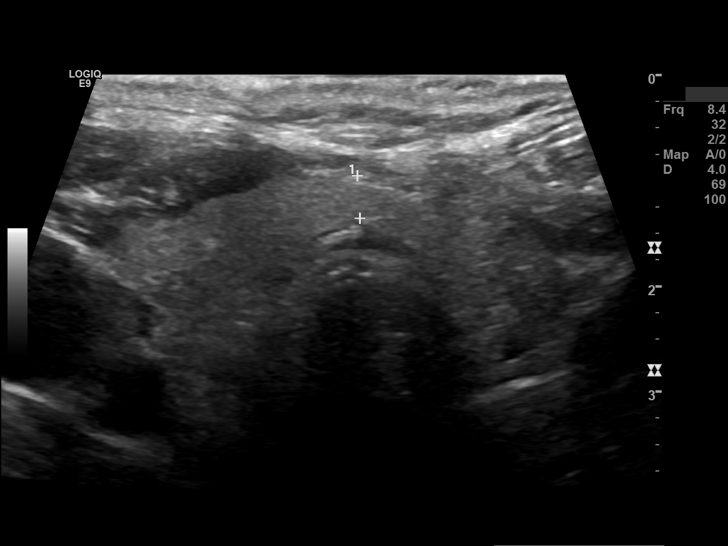
[im 17/26]
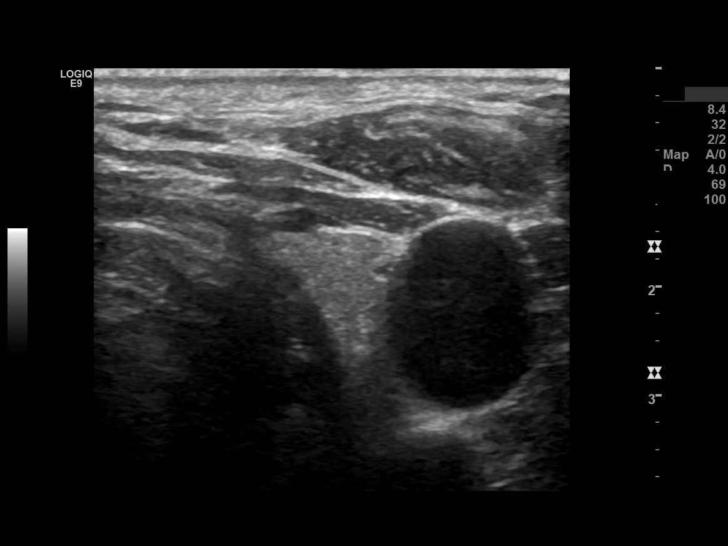
[im 19/26]
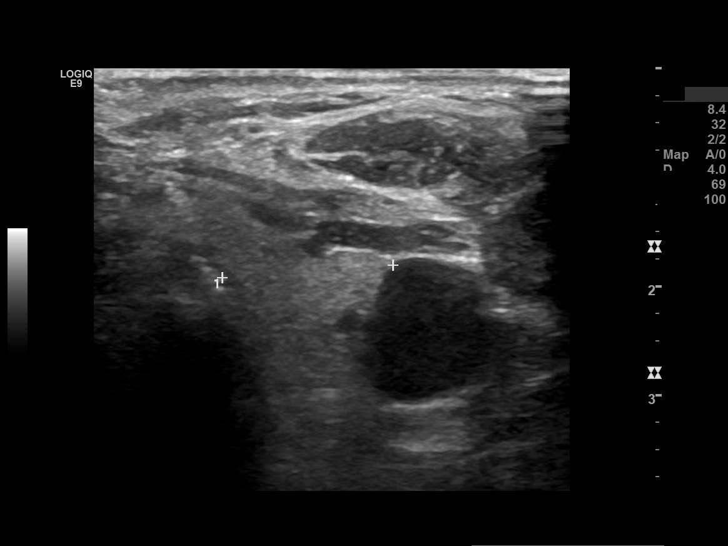
[im 21/26]
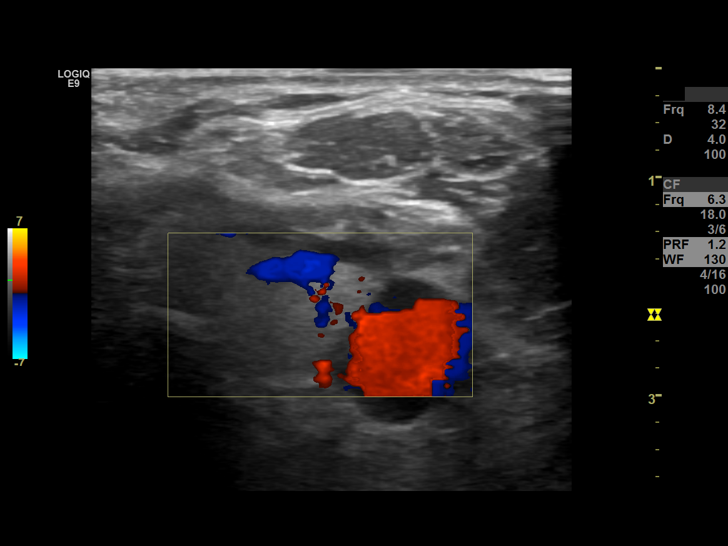
[im 23/26]
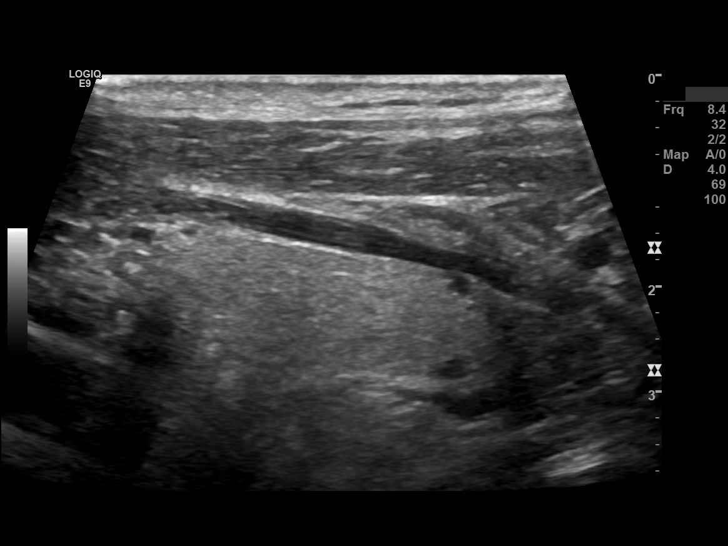
[im 26/26]
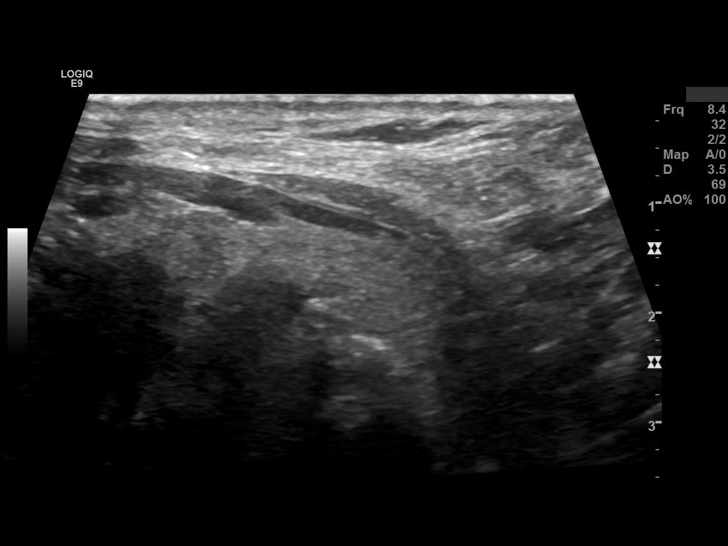

[14 of 25 positions shown; findings below may reference images not displayed]

FINDINGS: Right lobe of the thyroid is 4.6 x 1.8 x 2 cm in size. There is diffuse mild inhomogeneity. There is a solid lesion 13 x 11 x 13 mm, midpole, mildly ill-defined in its margins, isoechoic, mixed echotexture with some cystic components. It is slightly wider than tall. The isthmus is 4 mm.

The left lobe is normal in echotexture 4.1 x 1.6 x 1.6 cm in size. No focal abnormality seen on the left.
IMPRESSION: Findings consistent with a TI-RADS 3 lesion right lobe of the thyroid. Consider fine-needle aspiration biopsy

.

## 2017-12-05 IMAGING — US BX THYROID FNA RT
1 series · 9 of 9 positions shown · non-contrast
Comparison: none

HISTORY: Ultrasound-guided fine-needle aspiration right thyroid nodule

[Series 1: bx thyroid fna right · 9 of 9 slices shown]
[im 1/9]
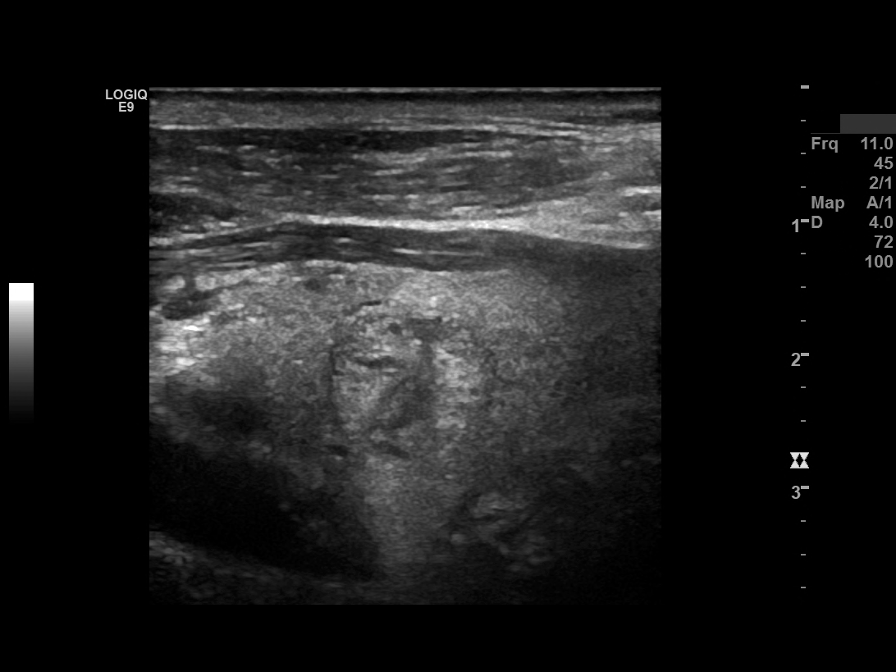
[im 2/9]
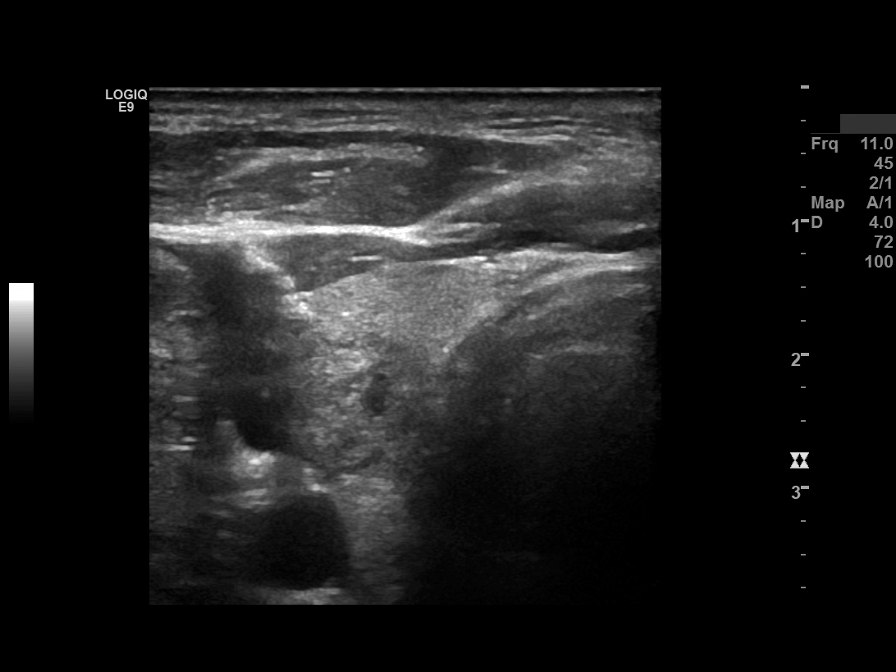
[im 3/9]
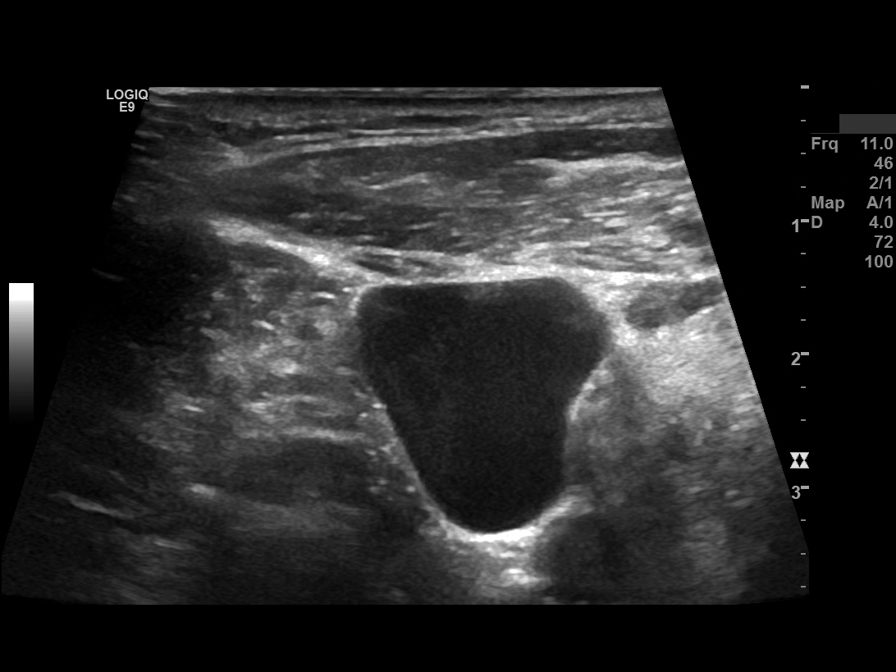
[im 4/9]
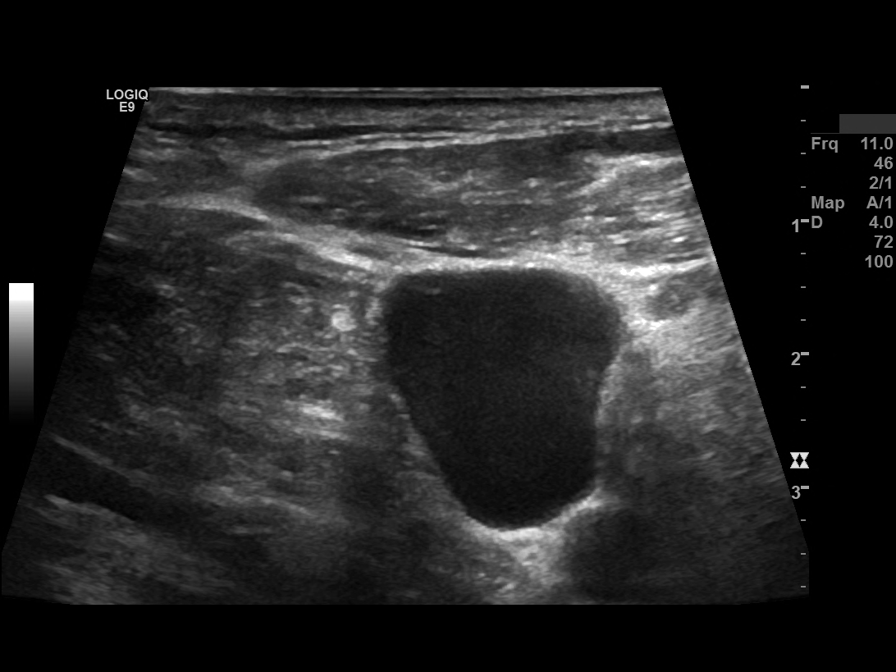
[im 5/9]
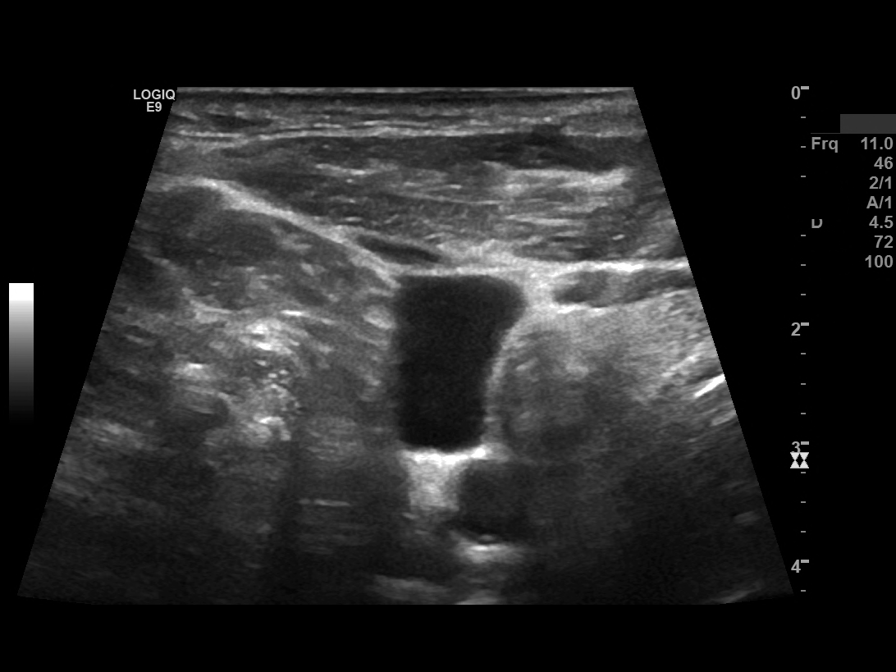
[im 6/9]
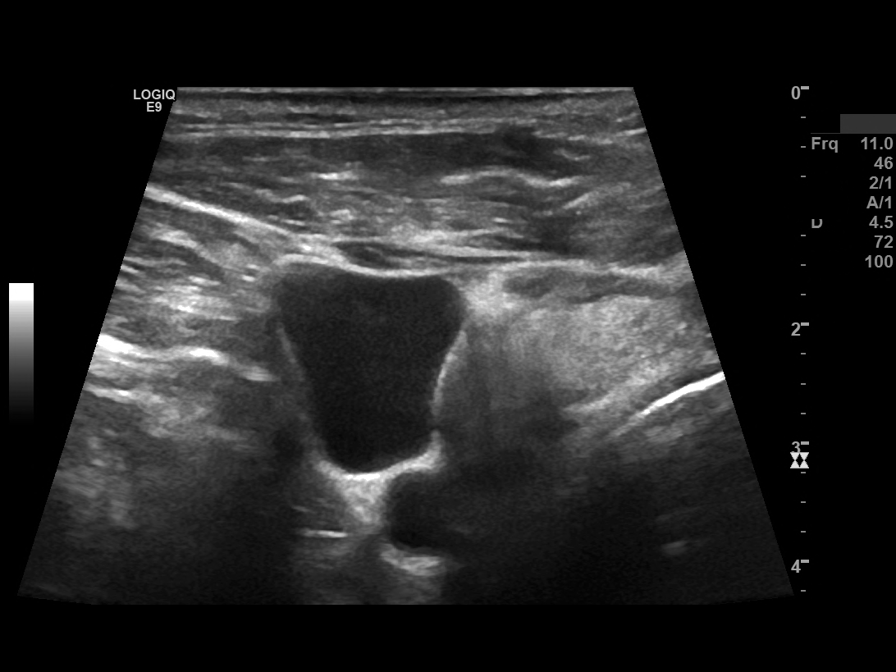
[im 7/9]
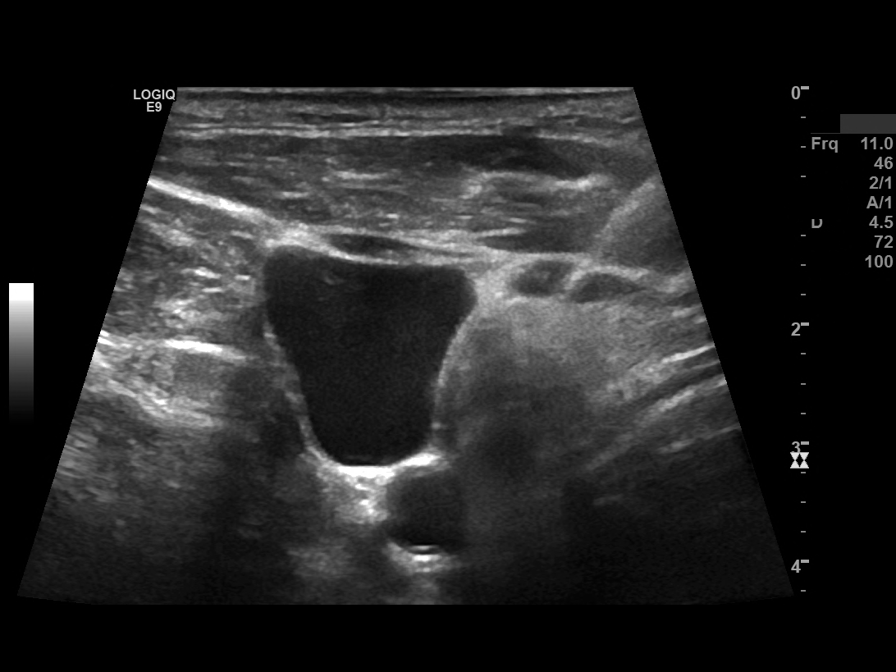
[im 8/9]
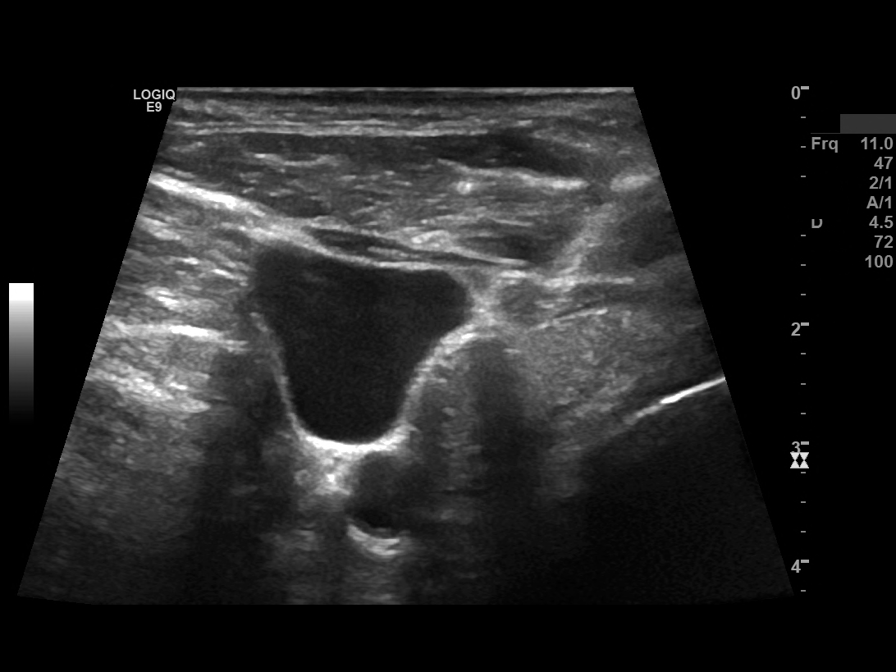
[im 9/9]
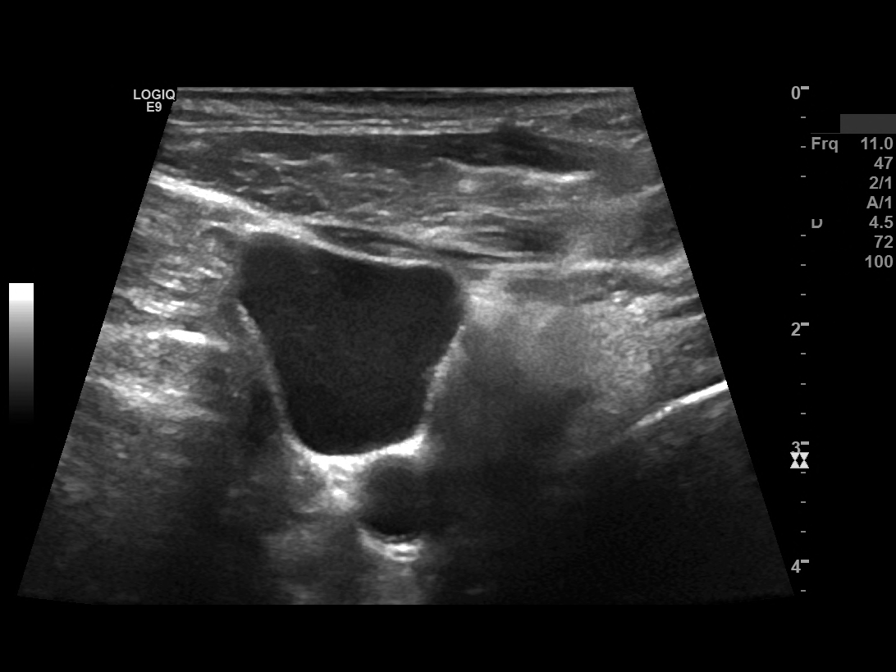

[9 of 9 positions shown; findings below may reference images not displayed]

PROCEDURE:  Prior to the procedure, the risks, complications, and alternatives were discussed.  Informed consent was obtained and signed. The neck was prepped with a Betadine solution. Buffered 1% lidocaine was used as a local anesthetic.  Under direct ultrasound guidance and visualization, a 25-gauge needle was directed into the right thyroid nodule, and samples were obtained with rapid up-and-down motions. The procedure was well tolerated by the patient with no significant bleeding or discomfort.
IMPRESSION: Ultrasound-guided fine-needle aspiration right thyroid nodule.

## 2017-12-21 MED ORDER — GLIMEPIRIDE 2 MG PO TABS
2 MG | ORAL_TABLET | ORAL | 1 refills | Status: DC
Start: 2017-12-21 — End: 2018-05-21

## 2017-12-21 MED ORDER — METFORMIN HCL 1000 MG PO TABS
1000 MG | ORAL_TABLET | ORAL | 3 refills | Status: DC
Start: 2017-12-21 — End: 2018-11-05

## 2017-12-21 MED ORDER — ATORVASTATIN CALCIUM 20 MG PO TABS
20 MG | ORAL_TABLET | Freq: Every day | ORAL | 1 refills | Status: DC
Start: 2017-12-21 — End: 2018-05-21

## 2017-12-21 MED ORDER — OMEPRAZOLE 40 MG PO CPDR
40 MG | ORAL_CAPSULE | ORAL | 3 refills | Status: DC
Start: 2017-12-21 — End: 2018-11-05

## 2017-12-21 MED ORDER — SITAGLIPTIN PHOSPHATE 100 MG PO TABS
100 MG | ORAL_TABLET | Freq: Every day | ORAL | 1 refills | Status: DC
Start: 2017-12-21 — End: 2018-05-21

## 2017-12-21 MED ORDER — LEVOTHYROXINE SODIUM 100 MCG PO TABS
100 MCG | ORAL_TABLET | ORAL | 3 refills | Status: DC
Start: 2017-12-21 — End: 2018-11-05

## 2017-12-21 MED ORDER — CARVEDILOL 25 MG PO TABS
25 MG | ORAL_TABLET | ORAL | 1 refills | Status: DC
Start: 2017-12-21 — End: 2018-05-21

## 2017-12-21 NOTE — Telephone Encounter (Signed)
Medication:   Requested Prescriptions     Pending Prescriptions Disp Refills   ??? atorvastatin (LIPITOR) 20 MG tablet 90 tablet 1     Sig: Take 1 tablet by mouth daily TAKE 1 TABLET BY MOUTH  DAILY   ??? metFORMIN (GLUCOPHAGE) 1000 MG tablet 180 tablet 1   ??? carvedilol (COREG) 25 MG tablet 180 tablet 1     Sig: TAKE 1 TABLET BY MOUTH TWO  TIMES DAILY   ??? glimepiride (AMARYL) 2 MG tablet 180 tablet 1     Sig: TAKE 1 TABLET BY MOUTH TWO  TIMES DAILY WITH MEALS   ??? omeprazole (PRILOSEC) 40 MG delayed release capsule 90 capsule 1   ??? levothyroxine (SYNTHROID) 100 MCG tablet 90 tablet 1   ??? SITagliptin (JANUVIA) 100 MG tablet 90 tablet 1     Sig: Take 1 tablet by mouth daily       Last Filled:      Patient Phone Number: [phone] (home) [phone] (work)    Last appt: Visit date not found   Next appt: Visit date not found    Last BMP:   Lab Results   Component Value Date    NA 143 09/06/2017    K 4.5 09/06/2017    CL 104 09/06/2017    CO2 27 09/06/2017    ANIONGAP 12 09/06/2017    GLUCOSE 113 09/06/2017    GLUCOSE 274 03/08/2016    BUN 24 09/06/2017    CREATININE 1.1 09/06/2017    LABGLOM >60 09/06/2017    GFRAA >60 09/06/2017    GFRAA >60 08/15/2011    CALCIUM 9.3 09/06/2017      Last CMP:   Lab Results   Component Value Date    NA 143 09/06/2017    K 4.5 09/06/2017    CL 104 09/06/2017    CO2 27 09/06/2017    ANIONGAP 12 09/06/2017    GLUCOSE 113 09/06/2017    GLUCOSE 274 03/08/2016    BUN 24 09/06/2017    CREATININE 1.1 09/06/2017    LABGLOM >60 09/06/2017    GFRAA >60 09/06/2017    GFRAA >60 08/15/2011    PROT 6.4 09/06/2017    PROT 6.8 05/04/2011    LABALBU 4.4 09/06/2017    AGRATIO 2.2 09/06/2017    BILITOT 0.4 09/06/2017    ALKPHOS 80 09/06/2017    ALT 13 09/06/2017    AST 14 09/06/2017    GLOB 2.0 09/06/2017     Last Renal Function:   Lab Results   Component Value Date    NA 143 09/06/2017    K 4.5 09/06/2017    CL 104 09/06/2017    CO2 27 09/06/2017    GLUCOSE 113 09/06/2017    GLUCOSE 274 03/08/2016    BUN 24  09/06/2017    CREATININE 1.1 09/06/2017    LABALBU 4.4 09/06/2017    CALCIUM 9.3 09/06/2017    GFR >60 08/15/2011    GFRAA >60 09/06/2017    GFRAA >60 08/15/2011     Last Labs DM:   Lab Results   Component Value Date    LABA1C 6.1 09/06/2017     Last Lipid:   Lab Results   Component Value Date    CHOL 124 09/06/2017    TRIG 83 09/06/2017    HDL 31 09/06/2017    HDL 34 05/04/2010    LDLCALC 76 09/06/2017     Last PSA:   Lab Results   Component Value Date  PSA 0.57 09/06/2017     Last Thyroid:   Lab Results   Component Value Date    TSH 2.36 09/06/2017    T4FREE 1.7 04/14/2017       Last OARRS: No flowsheet data found.    Preferred Pharmacy:   [facility] - [place], [place] - [address] [place] - [place] [phone] - F [phone]  [address] [place]  [address]  [place] [address]  Phone: [phone] Fax: [phone]

## 2018-01-24 ENCOUNTER — Emergency Department: Admit: 2018-01-24 | Payer: MEDICARE

## 2018-01-24 ENCOUNTER — Inpatient Hospital Stay: Admit: 2018-01-24 | Discharge: 2018-01-24 | Disposition: A | Discharge status: Home / Self Care | Payer: MEDICARE

## 2018-01-24 DIAGNOSIS — S91312A Laceration without foreign body, left foot, initial encounter: Secondary | ICD-10-CM

## 2018-01-24 MED ORDER — bacitracin-polymyxin b (POLYSPORIN) ointment
500-10000 | Freq: Two times a day (BID) | TOPICAL | 0 refills | Status: AC
Start: 2018-01-24 — End: 2018-01-24

## 2018-01-24 MED ORDER — lidocaine-EPINEPHrine 1 %-1:100,000 injection 1 mL
1 | Freq: Once | INTRAMUSCULAR | Status: AC
Start: 2018-01-24 — End: 2018-01-24

## 2018-01-24 MED ORDER — bacitracin-polymyxin b (POLYSPORIN) ointment
500-10000 | Freq: Two times a day (BID) | TOPICAL | 0 refills | Status: AC
Start: 2018-01-24 — End: 2018-01-31

## 2018-01-24 MED ORDER — sodium chloride, irrigation 0.9 % irrigation
0.9 | Status: AC
Start: 2018-01-24 — End: 2018-01-24

## 2018-01-24 MED FILL — XYLOCAINE WITH EPINEPHRINE 1 %-1:100,000 INJECTION SOLUTION: 1 1 %-1:100,000 | INTRAMUSCULAR | Qty: 20

## 2018-01-24 MED FILL — SODIUM CHLORIDE 0.9 % IRRIGATION SOLUTION: 0.9 0.9 % | Qty: 1000

## 2018-01-24 NOTE — Unmapped (Signed)
Patient states that he cut his left foot on his storm door this evening and it is still bleeding as he is on Coumadin.

## 2018-01-24 NOTE — Unmapped (Signed)
ED Attending Attestation Note    Date of service:  (Not on file)    This patient was seen by the APP.  I have seen and examined the patient, agree with the workup, evaluation, management and diagnosis. The care plan has been discussed and I concur.      My assessment reveals a 77 y.o. male presents to the emergency department for evaluation of left foot injury.  The patient sustained a superficial laceration to left foot.  The patient wound will be repaired by the emergency medicine physician assistant.  I was present during keep portion of the procedure.

## 2018-01-24 NOTE — Unmapped (Signed)
Keep wound clean and dry.  Apply antibiotic ointment twice daily.  Follow-up with your doctor or return to the ER in one week for suture removal.  Return sooner for any signs of infection including redness, swelling, drainage.

## 2018-01-24 NOTE — Unmapped (Signed)
[facility] ED Note  Date of Service: (Not on file)  Reason for Visit: Foot Injury (left)      Patient History     HPI:  [name] is a 77 y.o. male who presents to the Emergency Department with a chief complaint of foot injury.  Patient cut the top of his left foot on a storm door at approximately 7 PM.  Patient is on Eliquis and reports he can't get it to stop bleeding.  No other injuries.  Tetanus is up to date.  Otherwise well.       Past Medical History:   Diagnosis Date   ??? Atrial fibrillation (CMS Dx)     corrected with Mini-maze procedure   ??? Cataract     left eye   ??? Diabetes mellitus (CMS Dx)     type 2   ??? Dilated cardiomyopathy (CMS Dx)    ??? Hearing loss     left ear   ??? Hypercholesteremia    ??? Hypertension    ??? Sleep apnea    ??? Thyroid disease     hypothyroidism       Past Surgical History:   Procedure Laterality Date   ??? COLONOSCOPY W/ POLYPECTOMY      benign  has at 3 plus procedures   ??? HERNIA REPAIR  9/09    right inguinal, mini maze   ??? HERNIA REPAIR  2/10    herniated lungs bilateral (repaired with mesh)   ??? mini-maze      corrected his A-fib   ??? SEPTOPLASTY     ??? UVULOPALATOPHARYNGOPLASTY     ??? UVULOPALATOPHARYNGOPLASTY      has had esophageal dilations in the past (Dr.Decter)   ??? WISDOM TOOTH EXTRACTION         Family History   Problem Relation Age of Onset   ??? Diabetes Mother         type 2   ??? Hypertension Mother    ??? Coronary artery disease Sister    ??? Diabetes Sister         type 2   ??? Coronary artery disease Sister    ??? Coronary artery disease Brother    ??? Hypertension Brother    ??? Diabetes Brother         type 2   ??? Hypertension Brother        [name]  reports that he has never smoked. He has never used smokeless tobacco. He reports that he does not drink alcohol or use drugs.    Previous Medications    APIXABAN (ELIQUIS) 5 MG TAB    Take 5 mg by mouth 2 times a day.    ASPIRIN 81 MG EC TABLET    Take 81 mg by mouth daily.    ATORVASTATIN (LIPITOR) 10 MG TABLET    Take 10 mg  by mouth at bedtime.     CARVEDILOL (COREG) 25 MG TABLET    Take 25 mg by mouth 2 (two) times daily.      CHORIONIC GONADOTROPIN, HUMAN 10,000 UNIT INJECTION    Inject 10,000 Units into the muscle once.    GLIMEPIRIDE (AMARYL) 2 MG TABLET    Take 1 tablet by mouth  every morning before  breakfast    GLUC/CHON-MSM#1/C/MANG/BOS/BOR (OSTEO BI-FLEX ORAL)    Take by mouth. OSTEO BI-FLEX JOINT SHIELD TABS     GLYBURIDE-METFORMIN (GLUCOVANCE) 2.5-500 MG PER TABLET    Take 2 tablets by mouth 2  times a day.     LEVOTHYROXINE (SYNTHROID, LEVOTHROID) 100 MCG TABLET    Take 100 mcg by mouth every morning.     LOSARTAN-HYDROCHLOROTHIAZIDE (HYZAAR) 100-25 MG PER TABLET    Take 1 tablet by mouth daily.    OMEPRAZOLE (PRILOSEC) 40 MG CAPSULE    Take 40 mg by mouth every morning.     PROPAFENONE (RYTHMOL) 225 MG TABLET    Take 225 mg by mouth 3 times a day.    SAW PALMETTO XTR/ZINC PICOLIN (SAW PALMETTO EXTRACT ORAL)    Take 2 tablets by mouth 2 times a day.        Allergies:   Allergies as of 01/24/2018   ??? (No Known Allergies)       Review of Systems     Review of Systems   Constitutional: Negative.    Respiratory: Negative.    Cardiovascular: Negative.    Musculoskeletal: Negative.    Skin:        +laceration   Neurological: Negative.  Negative for sensory change and focal weakness.   All other systems reviewed and are negative.          Physical Exam     General: Well-developed well-nourished in no acute distress.  HEENT: Head normocephalic atraumatic.  Pupils equal and round.  External ears and nose normal.   Neck: Full range of motion, supple.  Pulmonary: Lungs clear to auscultation bilaterally.  No wheezing, rhonchi, or rales.  Cardiac: Regular rate and rhythm.  No murmurs, rubs, or gallops.  Clear S1, S2.  Musculoskeletal: Moving all extremities appropriately, no obvious weakness noted.  2 cm irregular but superficial laceration to the dorsum of the left foot with mild bleeding, no pulsatile bleeding. No FB. No bony  tenderness. SILT. 2/4 DP pulses.  Vascular: Palpable pulses to all extremities.  No pitting edema noted.  Skin: Warm and dry.  Neuro: Alert and oriented.  Cranial nerves II through XII without deficit.    Psych:  Normal affect, Normal judgement, Normal mood.  Normal affect, and behavior.    ED Course and MDM     MEDICAL DECISION MAKING    RECENT VITALS:  BP: 147/76, Temp: 97.8 ??F (36.6 ??C), Heart Rate: 68, Resp: 18     RADIOLOGY:  X-ray Foot Left min 3-views    (Results Pending)       LABS:   Labs Reviewed - No data to display    MEDS:  Medications   lidocaine-EPINEPHrine 1 %-1:100,000 injection 1 mL (has no administration in time range)   sodium chloride, irrigation 0.9 % irrigation (has no administration in time range)       PROCEDURES: N/A    Emergency Department Procedures     Procedures have been performed under the supervision of the attending physician Dr. Cherly Anderson, MD who was present during key portions of any procedures performed in the emergency department    Laceration Repair Procedure Note  Indication: Laceration    Procedure: The patient was placed in the appropriate position and anesthesia around the laceration was obtained by infiltration using 1% Lidocaine with epinephrine. The area was then irrigated with normal saline. The laceration was closed with 5-0 Ethilon using interrupted sutures. There were no additional lacerations requiring repair. The wound area was then dressed with a sterile dressing.      Total repaired wound length: 2 cm.     Other Items: Suture count: 3    The patient tolerated the procedure well.  Complications: None    CONSULTS:  None    MEDICAL DECISION MAKING / ED COURSE:    The patient was seen and examined by myself and presented to Dr. Cherly Anderson, MD, who also examined the patient and agrees with assessment and plan.    [name] is a 77 y.o. male who presents to the Emergency Department with a chief complaint of foot injury. Patient is well-appearing and in no  acute distress.  Patient with a superficial laceration to the dorsum of the left foot.  Patient is neurovascularly intact. Tetanus is up-to-date.  Laceration repair as above.    Patient's tetanus is up to date. Patient is told to keep the dressing and wound dry and clean. They are also told to change the bandage once a day and apply bacitracin ointment daily.   Patient was instructed to follow-up with their PCP or return to the ED in 7 days to get the sutures removed. They are to return sooner if develop worsening symptoms, pain, redness, or fever.     The patient was given pertinent discharge instructions, return precautions and followup instructions and verbalized understanding and is in agreement with the plan.    The patient tolerated their visit well.  They were seen and evaluated by the attending physician Dr. Cherly Anderson, MD who agreed with the assessment and plan.  The patient and / or the family were informed of the results of any tests, a time was given to answer questions, a plan was proposed and they agreed with plan.        DISCHARGE DIAGNOSIS:  1. Laceration of left foot, initial encounter        PATIENT REFERRED TO:  [name], MD    In 1 week  For suture removal    [facility] Emergency Department  [address]  [place] [address]  [phone]  In 1 week  For suture removal      DISCHARGE MEDICATIONS:  New Prescriptions    BACITRACIN-POLYMYXIN B (POLYSPORIN) OINTMENT    Apply topically 2 times a day for 7 days.         Critical Care Time (Attendings)          Kathyrn Sheriff, [place]  01/24/18 [phone]

## 2018-02-02 ENCOUNTER — Ambulatory Visit: Admit: 2018-02-02 | Discharge: 2018-02-02 | Payer: MEDICARE | Attending: Internal Medicine | Primary: Internal Medicine

## 2018-02-02 DIAGNOSIS — Z4802 Encounter for removal of sutures: Secondary | ICD-10-CM

## 2018-02-02 NOTE — Progress Notes (Signed)
Subjective:      Patient ID: Carl Blair is a 77 y.o. male.    HPI  Here for suture removal,  Had the laceration wound 1 days ago, went to the UC and had 3 stithces put in,   Needs to be removed.   No swelling or redness or fever,  Review of Systems  All the review of systems negative except above   Objective:   Physical Exam  Has a laceration wound- has 3 stitches,   Some surrounding redness, but ready to be removed,  3 stitches removed w sterile equipment.  Assessment:      Suture removal       Plan:      Done.        Marcello Fennel, MD

## 2018-02-08 LAB — DIABETIC RETINOPATHY EXAM
Cataracts: POSITIVE
Diabetic Retinopathy: NEGATIVE
Glaucoma: NEGATIVE
Intraocular Pressure Eye: 18

## 2018-02-08 LAB — HM DIABETES EYE EXAM: Diabetic Retinopathy: NEGATIVE

## 2018-03-07 ENCOUNTER — Emergency Department: Admit: 2018-03-07 | Payer: MEDICARE

## 2018-03-07 ENCOUNTER — Inpatient Hospital Stay
Admission: EM | Admit: 2018-03-07 | Discharge: 2018-03-09 | Disposition: A | Discharge status: Home / Self Care | Payer: MEDICARE

## 2018-03-07 DIAGNOSIS — J189 Pneumonia, unspecified organism: Secondary | ICD-10-CM

## 2018-03-07 LAB — DIFFERENTIAL
Basophils Absolute: 25 /uL (ref 0–200)
Basophils Relative: 0.2 % (ref 0.0–1.0)
Eosinophils Absolute: 62 /uL (ref 15–500)
Eosinophils Relative: 0.5 % (ref 0.0–8.0)
Lymphocytes Absolute: 1599 /uL (ref 850–3900)
Lymphocytes Relative: 13 % (ref 15.0–45.0)
Monocytes Absolute: 1009 /uL (ref 200–950)
Monocytes Relative: 8.2 % (ref 0.0–12.0)
Neutrophils Absolute: 9606 /uL (ref 1500–7800)
Neutrophils Relative: 78.1 % (ref 40.0–80.0)

## 2018-03-07 LAB — URINALYSIS W/RFL TO MICROSCOPIC
Bilirubin, UA: NEGATIVE
Glucose, UA: NEGATIVE mg/dL
Ketones, UA: 5 mg/dL
Leukocytes, UA: NEGATIVE
Nitrite, UA: NEGATIVE
Protein, UA: NEGATIVE mg/dL
RBC, UA: 36 /HPF (ref 0–3)
Specific Gravity, UA: 1.015 (ref 1.005–1.035)
Urobilinogen, UA: <2 mg/dL (ref 0.2–1.9)
WBC, UA: <1 /HPF (ref 0–5)
pH, UA: 7 (ref 5.0–8.0)

## 2018-03-07 LAB — ED BLOOD GAS PANEL, VENOUS
%HBO2, Venous: 82.5 % (ref 40.0–70.0)
Base Excess, Ven: 2 mmol/L (ref <=3.0)
CO2 Content, Venous: 27 mmol/L (ref 25–29)
Carboxyhemoglobin, Venous: 1.3 % (ref 0.0–2.0)
Free Calcium, WB: 4.62 mg/dL (ref 4.50–5.30)
Glucose: 159 mg/dL (ref 70–100)
HCO3, Ven: 26 mmol/L (ref 24–28)
Hematocrit. Blood Gas Panel: 35.3 % (ref 40–52)
Hemoglobin, Blood Gas Panel: 11.5 g/dL (ref 14.0–18.0)
Lactate, Ven: 1.6 mmol/L (ref 0.5–1.6)
Methemoglobin, Venous: 0.3 % (ref 0.0–1.5)
Potassium: 3.8 mEq/L (ref 3.5–5.3)
Reduced hemoglobin, Venous: 15.9 % (ref 0.0–5.0)
Sodium: 137 mEq/L (ref 136–146)
pCO2, Ven: 38 mm Hg (ref 41–51)
pH, Ven: 7.45 (ref 7.32–7.42)
pO2, Ven: 47 mm Hg (ref 25–40)

## 2018-03-07 LAB — BASIC METABOLIC PANEL
Anion Gap: 10 mmol/L (ref 3–16)
BUN: 21 mg/dL (ref 7–25)
CO2: 26 mmol/L (ref 21–33)
Calcium: 9.1 mg/dL (ref 8.6–10.3)
Chloride: 103 mmol/L (ref 98–110)
Creatinine: 1.13 mg/dL (ref 0.60–1.30)
Glucose: 161 mg/dL (ref 70–100)
Osmolality, Calculated: 294 mOsm/kg (ref 278–305)
Potassium: 3.9 mmol/L (ref 3.5–5.3)
Sodium: 139 mmol/L (ref 133–146)
eGFR AA CKD-EPI: 72 See note.
eGFR NONAA CKD-EPI: 62 See note.

## 2018-03-07 LAB — CBC
Hematocrit: 35.5 % (ref 38.5–50.0)
Hemoglobin: 11.6 g/dL (ref 13.2–17.1)
MCH: 26.6 pg (ref 27.0–33.0)
MCHC: 32.7 g/dL (ref 32.0–36.0)
MCV: 81.2 fL (ref 80.0–100.0)
MPV: 6.5 fL (ref 7.5–11.5)
Platelets: 185 10*3/uL (ref 140–400)
RBC: 4.37 10*6/uL (ref 4.20–5.80)
RDW: 16.1 % (ref 11.0–15.0)
WBC: 11 10*3/uL (ref 3.8–10.8)

## 2018-03-07 LAB — INFLUENZA A AND B ASSAY, NAA
Influenza A: NEGATIVE
Influenza B: NEGATIVE

## 2018-03-07 LAB — B NATRIURETIC PEPTIDE: BNP: 105 pg/mL (ref 0–100)

## 2018-03-07 LAB — TROPONIN I: Troponin I: <0.04 ng/mL (ref 0.00–0.03)

## 2018-03-07 LAB — APTT: aPTT: 41.9 seconds (ref 25.5–35.0)

## 2018-03-07 LAB — PROTIME-INR
INR: 1.1 (ref 0.9–1.1)
Protime: 14.1 seconds (ref 12.1–15.1)

## 2018-03-07 LAB — POC GLU MONITORING DEVICE: POC Glucose Monitoring Device: 162 mg/dL (ref 70–100)

## 2018-03-07 MED ORDER — apixaban (ELIQUIS) tablet 5 mg
5 | Freq: Two times a day (BID) | ORAL | Status: AC
Start: 2018-03-07 — End: 2018-03-09
  Administered 2018-03-08 – 2018-03-09 (×4): 5 mg via ORAL

## 2018-03-07 MED ORDER — insulin lispro (humaLOG) injection 0-5 Units
100 | Freq: Three times a day (TID) | SUBCUTANEOUS | Status: AC
Start: 2018-03-07 — End: 2018-03-09
  Administered 2018-03-08 – 2018-03-09 (×4): 1 [IU] via SUBCUTANEOUS

## 2018-03-07 MED ORDER — carvedilol (COREG) tablet 25 mg
25 | Freq: Two times a day (BID) | ORAL | Status: AC
Start: 2018-03-07 — End: 2018-03-09
  Administered 2018-03-08 – 2018-03-09 (×4): 25 mg via ORAL

## 2018-03-07 MED ORDER — azithromycin (ZITHROMAX) 500 mg in sodium chloride 0.9% 250 mL IVPB
INTRAVENOUS | Status: AC
Start: 2018-03-07 — End: 2018-03-09
  Administered 2018-03-09: 03:00:00 500 mg via INTRAVENOUS

## 2018-03-07 MED ORDER — pantoprazole (PROTONIX) EC tablet 40 mg
40 | Freq: Every day | ORAL | Status: AC
Start: 2018-03-07 — End: 2018-03-09
  Administered 2018-03-08 – 2018-03-09 (×2): 40 mg via ORAL

## 2018-03-07 MED ORDER — cefTRIAXone (ROCEPHIN) 1 g in sodium chloride 0.9% 100 mL ADDaptor IVPB
1 | INTRAMUSCULAR | Status: AC
Start: 2018-03-07 — End: 2018-03-09
  Administered 2018-03-09: 03:00:00 1 g via INTRAVENOUS

## 2018-03-07 MED ORDER — atorvastatin (LIPITOR) tablet 20 mg
20 | Freq: Every evening | ORAL | Status: AC
Start: 2018-03-07 — End: 2018-03-09
  Administered 2018-03-08 – 2018-03-09 (×2): 20 mg via ORAL

## 2018-03-07 MED ORDER — azithromycin (ZITHROMAX) 500 mg in dextrose 5% in water (D5W) 250 mL IVPB
500 | Freq: Once | INTRAVENOUS | Status: AC
Start: 2018-03-07 — End: 2018-03-07

## 2018-03-07 MED ORDER — dextrose 10 % (D10W) in water infusion
10 | INTRAVENOUS | Status: AC | PRN
Start: 2018-03-07 — End: 2018-03-09

## 2018-03-07 MED ORDER — azithromycin (ZITHROMAX) 500 mg in sodium chloride 0.9% 250 mL IVPB
Freq: Once | INTRAVENOUS | Status: AC
Start: 2018-03-07 — End: 2018-03-07
  Administered 2018-03-08: 01:00:00 500 mg via INTRAVENOUS

## 2018-03-07 MED ORDER — propafenone (RYTHMOL) immediate release tablet 300 mg
150 | Freq: Three times a day (TID) | ORAL | Status: AC
Start: 2018-03-07 — End: 2018-03-09
  Administered 2018-03-08 – 2018-03-09 (×6): 300 mg via ORAL

## 2018-03-07 MED ORDER — insulinlisprohumaLOGinjection04Units
100 | Freq: Every evening | SUBCUTANEOUS | Status: AC
Start: 2018-03-07 — End: 2018-03-09

## 2018-03-07 MED ORDER — sodium chloride 0.9 % 1,000 mL IV fluid
Freq: Once | INTRAVENOUS | Status: AC
Start: 2018-03-07 — End: 2018-03-07
  Administered 2018-03-07: 23:00:00 1000 mL via INTRAVENOUS

## 2018-03-07 MED ORDER — cefTRIAXone (ROCEPHIN) 1 g in sodium chloride 0.9% 100 mL ADDaptor IVPB
1 | Freq: Once | INTRAMUSCULAR | Status: AC
Start: 2018-03-07 — End: 2018-03-07
  Administered 2018-03-08: 01:00:00 1 g via INTRAVENOUS

## 2018-03-07 MED ORDER — glucose chewable tablet 12 g
4 | ORAL | Status: AC | PRN
Start: 2018-03-07 — End: 2018-03-09

## 2018-03-07 MED ORDER — sodium chloride flush 10 mL
INTRAMUSCULAR | Status: AC
Start: 2018-03-07 — End: 2018-03-09
  Administered 2018-03-08 – 2018-03-09 (×6): 10 mL via INTRAVENOUS

## 2018-03-07 MED ORDER — levothyroxine (SYNTHROID, LEVOTHROID) tablet 100 mcg
100 | Freq: Every morning | ORAL | Status: AC
Start: 2018-03-07 — End: 2018-03-09
  Administered 2018-03-08 – 2018-03-09 (×2): 100 ug via ORAL

## 2018-03-07 MED ORDER — aspirin EC tablet 81 mg
81 | Freq: Every day | ORAL | Status: AC
Start: 2018-03-07 — End: 2018-03-09
  Administered 2018-03-08 – 2018-03-09 (×2): 81 mg via ORAL

## 2018-03-07 MED FILL — AZITHROMYCIN 500 MG INTRAVENOUS SOLUTION: 500 500 mg | INTRAVENOUS | Qty: 500

## 2018-03-07 MED FILL — ELIQUIS 5 MG TABLET: 5 5 mg | ORAL | Qty: 1

## 2018-03-07 MED FILL — HUMALOG U-100 INSULIN 100 UNIT/ML SUBCUTANEOUS SOLUTION: 100 100 unit/mL | SUBCUTANEOUS | Qty: 3

## 2018-03-07 MED FILL — SODIUM CHLORIDE 0.9 % INTRAVENOUS SOLUTION: 1000.00 1000.00 mL | INTRAVENOUS | Qty: 1000

## 2018-03-07 MED FILL — PROPAFENONE 150 MG TABLET: 150 150 MG | ORAL | Qty: 2

## 2018-03-07 MED FILL — CARVEDILOL 25 MG TABLET: 25 25 MG | ORAL | Qty: 1

## 2018-03-07 MED FILL — ATORVASTATIN 20 MG TABLET: 20 20 MG | ORAL | Qty: 1

## 2018-03-07 MED FILL — CEFTRIAXONE 1 GRAM SOLUTION FOR INJECTION: 1 1 gram | INTRAMUSCULAR | Qty: 1

## 2018-03-07 NOTE — Unmapped (Signed)
Patient is resting comfortably.

## 2018-03-07 NOTE — ED Notes (Signed)
Report given by dayshift RN. Care transferred at this time.

## 2018-03-07 NOTE — Unmapped (Signed)
Family at bedside.

## 2018-03-07 NOTE — Unmapped (Signed)
ED Attending Attestation Note    Date of service:  03/07/2018    This patient was seen by the advanced practice provider.  I have seen and examined the patient, agree with the workup, evaluation, management and diagnosis. The care plan has been discussed.  I have reviewed the ECG and concur with the advanced practice provider's interpretation.    My assessment reveals a 77 y.o. male with hypoxia and cough x 24h, fever and CXR c/w pna, will treat as CAP.    CRITICAL CARE TIME    I have seen and examined this patient and provided 35 minutes of critical care time exclusive of separately billed procedures and treating other patients.  Critical care was necessary to treat or prevent imminent or life threatening deterioration of the following condition(s): respiratory failure    Critical care time was spent personally by me on the following activities: airway management, evaluation of patient's resonse to treatment, ordering and review of laboratory studies and ordering and review of radiographic studies

## 2018-03-07 NOTE — ED Notes (Signed)
Lab called for Blood culture

## 2018-03-07 NOTE — Unmapped (Signed)
Patient updated on plan of care and informed of any delays.  Call light is in reach and bed rails up for safety.  No concerns voiced at this time. The patient was advised to use call light if any concerns or questions.

## 2018-03-07 NOTE — Unmapped (Signed)
Problem: Glucose Imbalance related to diabetes disease process  Goal: Clinical indication of glucose balance is achieved  Outcome: Progressing  Intervention: Assess for symptoms of hyper/hypoglycemia  Note:      Intervention: Monitor blood glucose levels as ordered (as needed)  Note:      Intervention: Administer medications as ordered (as needed)  Note:      Intervention: Notify physician of ineffective treatment plan (as needed)  Note:         Problem: Fall Prevention  Goal: Patient will remain free of falls  Description  Assess and monitor vitals signs, neurological status including level of consciousness and orientation.  Reassess fall risk per hospital policy.    Ensure arm band on, uncluttered walking paths in room, adequate room lighting, call light and overbed table within reach, bed in low position, wheels locked, side rails up per policy (excluding SNF), and non-skid footwear provided.   Outcome: Progressing  Intervention: Keep call light within reach  Note:      Intervention: Keep bed in low position  Note:      Intervention: Offer toileting every 2 hours  Note:         Problem: Infection  Goal: Signs and symptoms of infections are decreased or avoided  Description  Assess and monitor patient for signs and symptoms of infection such as redness, warmth, discharge, and increased body temperature. Monitor and report abnormal lab values (ex-CBC and diff, serum protein, serum albumin, and cultures).  Wash hands properly before and after each patient care activity. Utilize standard precautions and use personal protective equipment (PPE) as indicated. Ensure aseptic care of all intravenous lines and invasive tubes/drains. Obtain immunization and exposure to communicable diseases history. Collaborate with interdisciplinary team and initiate plan and interventions as ordered.  Outcome: Progressing     Problem: Inadequate Airway Clearance  Goal: Patient will achieve/maintain normal respiratory  rate/effort  Description  Assess and monitor respiratory rate, effort,  breathing pattern and oxygenation as ordered or per policy. Monitor patient for restlessness, anxiety, air hunger.  Assess physical activity tolerance.  Assess patient's smoking history and intervene per policy. Collaborate with interdisciplinary team and initiate plans and interventions as needed.    Outcome: Progressing  Intervention: Plan activities to conserve energy  Note:

## 2018-03-07 NOTE — Unmapped (Signed)
[facility]   History and Physical    Admit Date:   03/07/2018    Patient's PCP:  Marcello Fennel, MD  DOB:     [birth year]  Patient Class:  Emergency    CHIEF COMPLAINT   No chief complaint on file.      HISTORY OF PRESENT ILLNESS   [name] is a 77 y.o. male w/ past medical history of  AFIB on AC, DM2, Hypothyroid, HTN  Now admitted with  weakness, hypoxia    According to pt he was feeling weak today and had an episode where he was so weak he had to sit down on ground and call EMS. He had one episode of nausea and emesis while waiting for EMS. He also had some hemoptysis in ED. He denies shortness of breath, fever, chills, abd pain, rhinorrhea, sore throat, diarrhea, constipation.    PAST MEDICAL HISTORY     Past Medical History:   Diagnosis Date   ??? Atrial fibrillation (CMS Dx)     corrected with Mini-maze procedure   ??? Cataract     left eye   ??? Diabetes mellitus (CMS Dx)     type 2   ??? Dilated cardiomyopathy (CMS Dx)    ??? Hearing loss     left ear   ??? Hypercholesteremia    ??? Hypertension    ??? Sleep apnea    ??? Thyroid disease     hypothyroidism       PAST SURGICAL HISTORY     Past Surgical History:   Procedure Laterality Date   ??? COLONOSCOPY W/ POLYPECTOMY      benign  has at 3 plus procedures   ??? HERNIA REPAIR  9/09    right inguinal, mini maze   ??? HERNIA REPAIR  2/10    herniated lungs bilateral (repaired with mesh)   ??? mini-maze      corrected his A-fib   ??? SEPTOPLASTY     ??? UVULOPALATOPHARYNGOPLASTY     ??? UVULOPALATOPHARYNGOPLASTY      has had esophageal dilations in the past (Dr.Decter)   ??? WISDOM TOOTH EXTRACTION         MEDICATIONS     (Not in a hospital admission)    No current facility-administered medications on file prior to encounter.      Current Outpatient Medications on File Prior to Encounter   Medication Sig Dispense Refill   ??? apixaban (ELIQUIS) 5 mg Tab Take 5 mg by mouth 2 times a day.     ??? aspirin 81 MG EC tablet Take 81 mg by mouth daily.     ??? atorvastatin (LIPITOR) 10  MG tablet Take 10 mg by mouth at bedtime.      ??? carvedilol (COREG) 25 MG tablet Take 25 mg by mouth 2 (two) times daily.       ??? chorionic gonadotropin, human 10,000 unit injection Inject 10,000 Units into the muscle once.     ??? glimepiride (AMARYL) 2 MG tablet Take 1 tablet by mouth  every morning before  breakfast     ??? GLUC/CHON-MSM#1/C/MANG/BOS/BOR (OSTEO BI-FLEX ORAL) Take by mouth. OSTEO BI-FLEX JOINT SHIELD TABS      ??? glyBURIDE-metformin (GLUCOVANCE) 2.5-500 mg per tablet Take 2 tablets by mouth 2 times a day.      ??? levothyroxine (SYNTHROID, LEVOTHROID) 100 MCG tablet Take 100 mcg by mouth every morning.      ??? losartan-hydrochlorothiazide (HYZAAR) 100-25 mg per tablet Take 1 tablet  by mouth daily.     ??? omeprazole (PRILOSEC) 40 MG capsule Take 40 mg by mouth every morning.      ??? propafenone (RYTHMOL) 225 MG tablet Take 225 mg by mouth 3 times a day.     ??? SAW PALMETTO XTR/ZINC PICOLIN (SAW PALMETTO EXTRACT ORAL) Take 2 tablets by mouth 2 times a day.            ALLERGIES     Patient has no known allergies.    SOCIAL HISTORY     TOBACCO:  reports that he has never smoked. He has never used smokeless tobacco.       ETOH:   reports no history of alcohol use.  DRUG:    Social History     Substance and Sexual Activity   Drug Use No    Comment: 04-14-11          FAMILY HISTORY     Family History   Problem Relation Age of Onset   ??? Diabetes Mother         type 2   ??? Hypertension Mother    ??? Coronary artery disease Sister    ??? Diabetes Sister         type 2   ??? Coronary artery disease Sister    ??? Coronary artery disease Brother    ??? Hypertension Brother    ??? Diabetes Brother         type 2   ??? Hypertension Brother          REVIEW OF SYSTEMS       REVIEW OF SYSTEMS:   Constitutional:  Negative for fever, chills or night sweats +weakness  ENT:  Negative for rhinorrhea, epistaxis, hoarseness, sore throat +cough  Respiratory:   Negative for shortness of breath, wheezing  Cardiovascular:   Negative for chest pain,  palpitations   Gastrointestinal:  Negative for nausea, vomiting, diarrhea  Genitourinary:  Negative for polyuria, dysuria   Hematologic/Lymphatic:  Negative for bleeding tendency, easy bruising  Musculoskeletal:  Negative for myalgias and arthralgias  Neurologic:  Negative for confusion, dysarthria  Skin:  Negative for itching, rash  Psychiatric:  Negative for depression, anxiety, agitation  Endocrine:  Negative for polydipsia, polyuria,heat /cold intolerance  And as per HPI      PHYSICAL EXAM     BP 140/67    Pulse 88    Temp 100.1 ??F (37.8 ??C) (Oral)    Resp 18    Ht 6' (1.829 m)    Wt (!) 240 lb (108.9 kg)    SpO2 91%    BMI 32.55 kg/m??       General appearance:  Well nourished, appears stated age and in no acute distress.  Eyes: Sclera clear, pupils equal  ENT: Moist mucous membranes, no thrush. Trachea midline.  Cardiovascular: S1, S2 present, regular rhythm.  No murmur, no gallop, no rub.   Respiratory: Good air exchange, no wheezes, no rhonchi, no crackles.  Gastrointestinal: Abdomen is soft, non-tender, not distended, normal bowel sounds are appreciated  Musculoskeletal: No cyanosis in digits, neck supple  Neurology:  Awake, alert and oriented in time, place and person.  No speech or motor deficits  Psychiatry: Normal affect.  Not agitated  Skin: Warm, dry, normal turgor, no rash  Extremities: No pitting pedal edema.      LABS     Results for orders placed or performed during the hospital encounter of 03/07/18   CBC   Result Value Ref Range  WBC 11.0 (H) 3.8 - 10.8 10E3/uL    RBC 4.37 4.20 - 5.80 10E6/uL    Hemoglobin 11.6 (L) 13.2 - 17.1 g/dL    Hematocrit 16.1 (L) 38.5 - 50.0 %    MCV 81.2 80.0 - 100.0 fL    MCH 26.6 (L) 27.0 - 33.0 pg    MCHC 32.7 32.0 - 36.0 g/dL    RDW 09.6 (H) 04.5 - 15.0 %    Platelets 185 140 - 400 10E3/uL    MPV 6.5 (L) 7.5 - 11.5 fL   B Natriuretic Peptide   Result Value Ref Range    BNP 105 (H) 0 - 100 pg/mL   Basic metabolic panel   Result Value Ref Range    Sodium 139 133 -  146 mmol/L    Potassium 3.9 3.5 - 5.3 mmol/L    Chloride 103 98 - 110 mmol/L    CO2 26 21 - 33 mmol/L    Anion Gap 10 3 - 16 mmol/L    BUN 21 7 - 25 mg/dL    Creatinine 4.09 8.11 - 1.30 mg/dL    Glucose 914 (H) 70 - 100 mg/dL    Calcium 9.1 8.6 - 78.2 mg/dL    Osmolality, Calculated 294 278 - 305 mOsm/kg    eGFR AA CKD-EPI 72 See note.    eGFR NONAA CKD-EPI 62 See note.   Troponin I   Result Value Ref Range    Troponin I <0.04 0.00 - 0.03 ng/mL   Urinalysis w/Reflex to Microscop, No Culture   Result Value Ref Range    Color, UA Yellow Yellow,Straw    Clarity, UA Clear Clear    Specific Gravity, UA 1.015 1.005 - 1.035    pH, UA 7.0 5.0 - 8.0    Protein, UA Negative Negative mg/dL    Glucose, UA Negative Negative mg/dL    Ketones, UA 5 (A) Negative mg/dL    Bilirubin, UA Negative Negative    Blood, UA Small (A) Negative    Nitrite, UA Negative Negative    Urobilinogen, UA <2.0 0.2 - 1.9 mg/dL    Leukocytes, UA Negative Negative    RBC, UA 36 (H) 0 - 3 /HPF    WBC, UA <1 0 - 5 /HPF   INR - Protime   Result Value Ref Range    Protime 14.1 12.1 - 15.1 seconds    INR 1.1 0.9 - 1.1   PTT, No Anticoagulant   Result Value Ref Range    aPTT 41.9 (H) 25.5 - 35.0 seconds   Differential   Result Value Ref Range    Neutrophils Relative 78.1 40.0 - 80.0 %    Lymphocytes Relative 13.0 (L) 15.0 - 45.0 %    Monocytes Relative 8.2 0.0 - 12.0 %    Eosinophils Relative 0.5 0.0 - 8.0 %    Basophils Relative 0.2 0.0 - 1.0 %    Neutrophils Absolute 9,606 (H) 1,500 - 7,800 /uL    Lymphocytes Absolute 1,599 850 - 3,900 /uL    Monocytes Absolute 1,009 (H) 200 - 950 /uL    Eosinophils Absolute 62 15 - 500 /uL    Basophils Absolute 25 0 - 200 /uL   Venous Blood Gas panel, ED   Result Value Ref Range    pH, Ven 7.45 (H) 7.32 - 7.42    pCO2, Ven 38 (L) 41 - 51 mm Hg    pO2, Ven 47 (H) 25 - 40 mm Hg  HCO3, Ven 26 24 - 28 mmol/L    CO2 Content, Venous 27 25 - 29 mmol/L    Base Excess, Ven 2.0 -2.0 - 3.0 mmol/L    Sodium 137 136 - 146 mEq/L     Potassium 3.8 3.5 - 5.3 mEq/L    Free Calcium, WB 4.62 4.50 - 5.30 mg/dL    Glucose 161 (H) 70 - 100 mg/dL    Hematocrit. Blood Gas Panel 35.3 (L) 40 - 52 %    Hemoglobin, Blood Gas Panel 11.5 (L) 14.0 - 18.0 g/dL    %HBO2, Venous 09.6 (H) 40.0 - 70.0 %    Carboxyhemoglobin, Venous 1.3 0.0 - 2.0 %    Methemoglobin, Venous 0.3 0.0 - 1.5 %    Reduced hemoglobin, Venous 15.9 (H) 0.0 - 5.0 %    Lactate, Ven 1.6 0.5 - 1.6 mmol/L   Influenza A+B PCR   Result Value Ref Range    Influenza A Negative Negative    Influenza B Negative Negative   POC Glucose Monitoring Device   Result Value Ref Range    POC Glucose Monitoring Device 162 (H) 70 - 100 mg/dL       Labs reveiwed  RADIOLOGY     X-ray Chest Pa And Lateral    Result Date: 03/07/2018  Exam: XR CHEST PA AND LATERAL Date: 03/07/2018 6:18 PM EST CLINICAL HISTORY: Cough, TECHNIQUE: 2 views of the chest. COMPARISON: CT abdomen pelvis dated February 07, 2013. FINDINGS: Medical Devices: None. Heart and Mediastinum: Cardiomediastinal silhouette is within normal limits. Lungs and Pleura: Patchy opacification of the right middle lobe, obscuring the cardiac silhouette. No evidence of large pleural effusion or pneumothorax. Bones and Soft tissue: Multiple remote fracture deformities of the bilateral ribs.     IMPRESSION: Right middle lobe opacification, concerning for pneumonitis. Follow-up chest radiograph after appropriate treatment is recommended to document resolution. Approved by Macy Mis, MD on 03/07/2018 6:38 PM EST I have personally reviewed the images and I agree with this report. Report Verified by: Letitia Caul, MD at 03/07/2018 7:13 PM EST       EKG Interpretation    Interpreted by emergency department physician     A. fib rhythm with a ventricular rate of 93.  Normal interval and axes.  Incomplete RBBB.  No evidence for acute ischemia or ST segment changes.    ASSESSMENT & PLAN   77 yo man now admitted with community acquired pneumonia.     #1 Right Lower Lobe  pneumonia on CXR with mild Hypoxia   Sputum Culture   Legionella and Strep Pneumo   Blood Cultures x2   Antibiotic Tx with IV Azithromycin and Rocephin   RT Eval with Duo-nebs prn   Pulm Toilet   Monitor WBC, Labs and VS   O2 NC prn to keep sat >92%   IV fluids     #2 Atrial fibrillation- s/p mini-Maze  controlled on home Eliquis and carvedilol    Continue home medications    #3 Diabetes mellitus 2-   HA1c   Hold home PO meds   SSI, monitor BS    #4 Hypertension- losartan and lisinopril     #5 Hypothyroid- home levothyroxine    #6 OSA- home CPAP    I believe that patient requires inpatient services and is anticipated to require >2 midnight stay in the hospital due to the following co-morbidities HTN, AFIB, Hypothyroid, DM2, medical history, severity of signs and symptoms weakness, hypoxia, current medical needs IV antibiotics, RT  with Duo-nebs, close monitoring, and risk of adverse events Respiratory failure. Total LOS estimated to be >2 Days.      Electronically signed,  Gardiner Ramus NP  [phone]  03/07/2018  8:13 PM  [facility]      I saw and examined the patient on 03/07/2018, and discussed the case with the NP and agree with the  findings and plan as documented in the NP???s note. I have made changes to above note as deemed necessary by me.     Kaylan Yates P. Scout Guyett  11:08 PM  03/07/2018

## 2018-03-07 NOTE — Unmapped (Signed)
Bed: B14W  Expected date:   Expected time:   Means of arrival:   Comments:  [name]  77 yr old male with complaints of weakness and fatigue times 1 hour.  Hx of afib  Vital signs stable GCS 15

## 2018-03-07 NOTE — Unmapped (Signed)
[facility] ED Note  Date of Service: 03/07/2018  Reason for Visit: No chief complaint on file.      Patient History     HPI:  [name] is a 77 y.o. male with a significant PMH of Afib on Eliquis, Dilated cardiomyopathy, DM2, HCL, HTN, who presents to the Emergency Department with a chief complaint of - weakness.  Patient said he started feeling weak after bringing his wife home from a procedure today.  Started having a dry cough.  Took a slight nap and then got up to use restroom and when he tried to get off the toilet he was so weak that he fell the floor and didn't have enough strength to get himself up.  He has a mild fever upon arrival and was hypoxic in the mid 80s on room air.  He denies feeling short of breath.  He denies any chest pain abdominal pain and vomiting or diarrhea.  Denies any body rash.       Past Medical History:   Diagnosis Date   ??? Atrial fibrillation (CMS Dx)     corrected with Mini-maze procedure   ??? Cataract     left eye   ??? Diabetes mellitus (CMS Dx)     type 2   ??? Dilated cardiomyopathy (CMS Dx)    ??? Hearing loss     left ear   ??? Hypercholesteremia    ??? Hypertension    ??? Sleep apnea    ??? Thyroid disease     hypothyroidism       Past Surgical History:   Procedure Laterality Date   ??? COLONOSCOPY W/ POLYPECTOMY      benign  has at 3 plus procedures   ??? HERNIA REPAIR  9/09    right inguinal, mini maze   ??? HERNIA REPAIR  2/10    herniated lungs bilateral (repaired with mesh)   ??? mini-maze      corrected his A-fib   ??? SEPTOPLASTY     ??? UVULOPALATOPHARYNGOPLASTY     ??? UVULOPALATOPHARYNGOPLASTY      has had esophageal dilations in the past (Dr.Decter)   ??? WISDOM TOOTH EXTRACTION         Family History   Problem Relation Age of Onset   ??? Diabetes Mother         type 2   ??? Hypertension Mother    ??? Coronary artery disease Sister    ??? Diabetes Sister         type 2   ??? Coronary artery disease Sister    ??? Coronary artery disease Brother    ???  Hypertension Brother    ??? Diabetes Brother         type 2   ??? Hypertension Brother        [name]  reports that he has never smoked. He has never used smokeless tobacco. He reports that he does not drink alcohol or use drugs.    Current Discharge Medication List      CONTINUE these medications which have NOT CHANGED    Details   apixaban (ELIQUIS) 5 mg Tab Take 5 mg by mouth 2 times a day.      aspirin 81 MG EC tablet Take 81 mg by mouth daily.      atorvastatin (LIPITOR) 10 MG tablet Take 10 mg by mouth at bedtime.       carvedilol (COREG) 25 MG tablet Take 25 mg by mouth 2 (  two) times daily.        chorionic gonadotropin, human 10,000 unit injection Inject 10,000 Units into the muscle once.      glimepiride (AMARYL) 2 MG tablet Take 1 tablet by mouth  every morning before  breakfast      GLUC/CHON-MSM#1/C/MANG/BOS/BOR (OSTEO BI-FLEX ORAL) Take by mouth. OSTEO BI-FLEX JOINT SHIELD TABS       glyBURIDE-metformin (GLUCOVANCE) 2.5-500 mg per tablet Take 2 tablets by mouth 2 times a day.       levothyroxine (SYNTHROID, LEVOTHROID) 100 MCG tablet Take 100 mcg by mouth every morning.       losartan-hydrochlorothiazide (HYZAAR) 100-25 mg per tablet Take 1 tablet by mouth daily.      omeprazole (PRILOSEC) 40 MG capsule Take 40 mg by mouth every morning.       propafenone (RYTHMOL) 225 MG tablet Take 225 mg by mouth 3 times a day.      SAW PALMETTO XTR/ZINC PICOLIN (SAW PALMETTO EXTRACT ORAL) Take 2 tablets by mouth 2 times a day.              Allergies:   Allergies as of 03/07/2018   ??? (No Known Allergies)       Review of Systems     ROS: Pertinent positive and negative findings as documented in the HPI otherwise all other systems were reviewed and were negative.      Physical Exam     General: Well-developed well-nourished in no acute distress.  HEENT: Head normocephalic atraumatic.  Pupils equal and round.  External ears and nose normal.  No cervical lymphadenopathy.  Neck: Full range of motion,  supple.  Pulmonary: Lungs clear to auscultation bilaterally.  No wheezing, rhonchi, or rales.  Cardiovascular: Regular rate and rhythm.  No murmurs, rubs, or gallops.  Clear S1, S2.  No pitting edema noted. Palpable pulses to all extremities.    Abdomen: Soft, nontender.  No rebound, guarding, or organomegaly noted.  No peritoneal findings.  Musculoskeletal: Moving all extremities appropriately with full range of motion.  No obvious weakness noted.    Skin: Warm and dry.  Neuro: Alert and oriented.  Cranial nerves II through XII without deficit.    Psych:  Normal affect, Normal judgement, Normal mood.  Normal behavior.    ED Course and MDM     RECENT VITALS:  BP 149/75 (BP Location: Left arm, Patient Position: Lying)    Pulse 91    Temp 100.1 ??F (37.8 ??C) (Oral)    Resp 16    Ht 6' (1.829 m)    Wt (!) 240 lb (108.9 kg)    SpO2 (!) 89%    BMI 32.55 kg/m??      ED Triage Vitals [03/07/18 1741]   Vital Signs Group      Temp 100.1 ??F (37.8 ??C)      Temp Source Oral      Heart Rate 91      Heart Rate Source Monitor      Resp 16      SpO2 (!) 89 %      BP 149/75      MAP (mmHg)       BP Location Left arm      BP Method Automatic      Patient Position Lying   SpO2 (!) 89 %   O2 Device      Vitals:    03/07/18 1741   BP: 149/75   BP Location: Left arm   Patient Position:  Lying   Pulse: 91   Resp: 16   Temp: 100.1 ??F (37.8 ??C)   TempSrc: Oral   SpO2: (!) 89%   Weight: (!) 240 lb (108.9 kg)   Height: 6' (1.829 m)      Temp Readings from Last 2 Encounters:   03/07/18 100.1 ??F (37.8 ??C) (Oral)   01/24/18 97.8 ??F (36.6 ??C) (Oral)     BP Readings from Last 8 Encounters:   03/07/18 149/75   01/24/18 146/78   07/29/17 146/67   07/23/17 135/74   06/19/17 122/60   03/24/17 (!) 161/101   03/16/17 144/72   02/21/17 158/80     Pulse Readings from Last 2 Encounters:   03/07/18 91   01/24/18 64     Wt Readings from Last 6 Encounters:   03/07/18 (!) 240 lb (108.9 kg)   01/24/18 (!) 241 lb (109.3 kg)   07/29/17 (!) 240 lb (108.9 kg)    07/23/17 (!) 236 lb (107 kg)   06/19/17 (!) 244 lb (110.7 kg)   03/24/17 (!) 259 lb 3.2 oz (117.6 kg)       RADIOLOGY:  X-ray Chest PA and Lateral    (Results Pending)       LABS:   Labs Reviewed   CBC - Abnormal; Notable for the following components:       Result Value    WBC 11.0 (*)     Hemoglobin 11.6 (*)     Hematocrit 35.5 (*)     MCH 26.6 (*)     RDW 16.1 (*)     MPV 6.5 (*)     All other components within normal limits   B NATRIURETIC PEPTIDE - Abnormal; Notable for the following components:    BNP 105 (*)     All other components within normal limits   BASIC METABOLIC PANEL - Abnormal; Notable for the following components:    Glucose 161 (*)     All other components within normal limits   URINALYSIS W/RFL TO MICROSCOP, NO CULT - Abnormal; Notable for the following components:    Ketones, UA 5 (*)     Blood, UA Small (*)     RBC, UA 36 (*)     All other components within normal limits   APTT - Abnormal; Notable for the following components:    aPTT 41.9 (*)     All other components within normal limits   DIFFERENTIAL - Abnormal; Notable for the following components:    Lymphocytes Relative 13.0 (*)     Neutrophils Absolute 9,606 (*)     Monocytes Absolute 1,009 (*)     All other components within normal limits   ED BLOOD GAS PANEL, VENOUS - Abnormal; Notable for the following components:    pH, Ven 7.45 (*)     pCO2, Ven 38 (*)     pO2, Ven 47 (*)     Glucose 159 (*)     Hematocrit. Blood Gas Panel 35.3 (*)     Hemoglobin, Blood Gas Panel 11.5 (*)     %HBO2, Venous 82.5 (*)     Reduced hemoglobin, Venous 15.9 (*)     All other components within normal limits   POC GLU MONITORING DEVICE - Abnormal; Notable for the following components:    POC Glucose Monitoring Device 162 (*)     All other components within normal limits   TROPONIN I   PROTIME-INR   INFLUENZA A+B PCR  MEDS:  Medications   sodium chloride 0.9 % 1,000 mL IV fluid (1,000 mLs Intravenous New Bag 03/07/18 1820)   cefTRIAXone (ROCEPHIN) 1 g  in sodium chloride 0.9% 100 mL ADDaptor IVPB (has no administration in time range)   azithromycin (ZITHROMAX) 500 mg in dextrose 5% in water (D5W) 250 mL IVPB (has no administration in time range)       EKG: Indication: Hypoxia.  Analysis:  A. fib rhythm with a ventricular rate of 93.  Normal interval and axes.  Incomplete RBBB.  No evidence for acute ischemia or ST segment changes.  EKG was reviewed by the attending physician.    PROCEDURES: N/A    CONSULTS:  ED CONTACT PROVIDER [facility]    MEDICAL DECISION MAKING / ED COURSE:      The patient was seen and examined by myself and presented to Dr. Jonna Munro, MD, who also saw the patient.  On arrival patient was placed on 2 L oxygen per nasal cannula, resting in the mid 90s.  Persistently tachypneic.  Labs were drawn that shows mild leukocytosis.  Chest x-ray shows right lower lobe pneumonia.  Blood cultures will be sent, flu swab pending.  Started on Rocephin and azithromycin.  Curb-65 pneumonia severity index recommended admission to the hospital, along with his need for supplemental oxygen.  Patient not currently septic, normotensive, rate controlled chronic A. Fib on Eliquis.      The patient tolerated their visit well.  They were seen and evaluated by the attending physician who agreed with the assessment and plan.  The patient and / or the family were informed of the results of any tests, a time was given to answer questions, a plan was proposed and they agreed with plan.      DIAGNOSIS:  1. Pneumonia of right lower lobe due to infectious organism (CMS Dx)    2. Chronic a-fib    3. Hypoxia        Future Appointments   Date Time Provider Department Center   04/23/2018  3:20 PM Donney Dice, CNP [facility]       PATIENT REFERRED TO:  No follow-up provider specified.    DISCHARGE MEDICATIONS:  Current Discharge Medication List                Critical Care Time (Attendings)   ]       [place] Kyoko [place], [place]  03/07/18 [phone]

## 2018-03-07 NOTE — ED Notes (Signed)
Report called to 1PCT charge nurse. Patient aware of plan of care. Updated wife. All of patient's belongings were sent with patient.

## 2018-03-07 NOTE — Unmapped (Signed)
Pt brought in by EMS with c/o weakness and SOB today.

## 2018-03-08 LAB — DIFFERENTIAL
Basophils Absolute: 23 /uL (ref 0–200)
Basophils Relative: 0.1 % (ref 0.0–1.0)
Eosinophils Absolute: 158 /uL (ref 15–500)
Eosinophils Relative: 0.7 % (ref 0.0–8.0)
Lymphocytes Absolute: 4095 /uL (ref 850–3900)
Lymphocytes Relative: 18.2 % (ref 15.0–45.0)
Monocytes Absolute: 1935 /uL (ref 200–950)
Monocytes Relative: 8.6 % (ref 0.0–12.0)
Neutrophils Absolute: 16290 /uL (ref 1500–7800)
Neutrophils Relative: 72.4 % (ref 40.0–80.0)

## 2018-03-08 LAB — CBC
Hematocrit: 31.2 % (ref 38.5–50.0)
Hemoglobin: 10.4 g/dL (ref 13.2–17.1)
MCH: 27.2 pg (ref 27.0–33.0)
MCHC: 33.4 g/dL (ref 32.0–36.0)
MCV: 81.4 fL (ref 80.0–100.0)
MPV: 6.6 fL (ref 7.5–11.5)
Platelets: 176 10*3/uL (ref 140–400)
RBC: 3.83 10*6/uL (ref 4.20–5.80)
RDW: 15.9 % (ref 11.0–15.0)
WBC: 22.5 10*3/uL (ref 3.8–10.8)

## 2018-03-08 LAB — BASIC METABOLIC PANEL
Anion Gap: 8 mmol/L (ref 3–16)
BUN: 18 mg/dL (ref 7–25)
CO2: 27 mmol/L (ref 21–33)
Calcium: 8.4 mg/dL (ref 8.6–10.3)
Chloride: 103 mmol/L (ref 98–110)
Creatinine: 1.07 mg/dL (ref 0.60–1.30)
Glucose: 164 mg/dL (ref 70–100)
Osmolality, Calculated: 292 mOsm/kg (ref 278–305)
Potassium: 3.8 mmol/L (ref 3.5–5.3)
Sodium: 138 mmol/L (ref 133–146)
eGFR AA CKD-EPI: 77 See note.
eGFR NONAA CKD-EPI: 67 See note.

## 2018-03-08 LAB — SPUTUM CULTURE PLUS STAIN: Gram Stain Result: NONE SEEN

## 2018-03-08 LAB — POC GLU MONITORING DEVICE
POC Glucose Monitoring Device: 155 mg/dL (ref 70–100)
POC Glucose Monitoring Device: 175 mg/dL (ref 70–100)
POC Glucose Monitoring Device: 176 mg/dL (ref 70–100)
POC Glucose Monitoring Device: 176 mg/dL (ref 70–100)

## 2018-03-08 LAB — HEMOGLOBIN A1C: Hemoglobin A1C: 6.6 % (ref 4.0–5.6)

## 2018-03-08 LAB — BLOOD CULTURE-PERIPHERAL
Culture Result: NO GROWTH
Culture Result: NO GROWTH

## 2018-03-08 LAB — STREP PNEUMONIAE ANTIGENS, URINE: Strep pneumo Ag UR: NEGATIVE

## 2018-03-08 LAB — LEGIONELLA ANTIGEN, URINE: Legionella Urine Antigen: NEGATIVE

## 2018-03-08 MED FILL — CARVEDILOL 25 MG TABLET: 25 25 MG | ORAL | Qty: 1

## 2018-03-08 MED FILL — PROPAFENONE 150 MG TABLET: 150 150 MG | ORAL | Qty: 2

## 2018-03-08 MED FILL — PANTOPRAZOLE 40 MG TABLET,DELAYED RELEASE: 40 40 MG | ORAL | Qty: 1

## 2018-03-08 MED FILL — SYNTHROID 100 MCG TABLET: 100 100 mcg | ORAL | Qty: 1

## 2018-03-08 MED FILL — ASPIRIN 81 MG TABLET,DELAYED RELEASE: 81 81 MG | ORAL | Qty: 1

## 2018-03-08 MED FILL — ELIQUIS 5 MG TABLET: 5 5 mg | ORAL | Qty: 1

## 2018-03-08 MED FILL — ATORVASTATIN 20 MG TABLET: 20 20 MG | ORAL | Qty: 1

## 2018-03-08 MED FILL — CEFTRIAXONE 1 GRAM SOLUTION FOR INJECTION: 1 1 gram | INTRAMUSCULAR | Qty: 1

## 2018-03-08 MED FILL — AZITHROMYCIN 500 MG INTRAVENOUS SOLUTION: 500 500 mg | INTRAVENOUS | Qty: 500

## 2018-03-08 NOTE — Unmapped (Signed)
[facility]  Hospitalist Progress Note    Admit Date: 03/07/2018       Name: [name]  DOB: 12-Oct-1940  MRN: [id number]  03/08/2018  Admit Status : Inpatient    Subjective:    CC: F/U for PNA    Feeling better than previous. Still coughing up some bloody clots from time to time. Afebrile.  Reviewed interval ancillary notes.    Physical Exam:    Temp:  [97.4 ??F (36.3 ??C)-100.1 ??F (37.8 ??C)] 97.4 ??F (36.3 ??C)  Heart Rate:  [52-91] 52  Resp:  [16-36] 17  BP: (122-149)/(66-87) 129/87    Intake/Output Summary (Last 24 hours) at 03/08/2018 1554  Last data filed at 03/08/2018 1309  Gross per 24 hour   Intake 2110 ml   Output 600 ml   Net 1510 ml          BP 129/87 (BP Location: Right arm, Patient Position: Lying)    Pulse 52    Temp 97.4 ??F (36.3 ??C) (Oral)    Resp 17    Ht 6' (1.829 m)    Wt (!) 241 lb 5 oz (109.5 kg)    SpO2 97%    BMI 32.73 kg/m??     Gen: NAD  Chest: Some congestion noted  CV: RRR  Abd: soft, NT/ND, +BS  Ext: no edema    Diagnostic Studies:      Recent Labs     03/07/18  1752 03/08/18  0545   WBC 11.0* 22.5*   HGB 11.6* 10.4*   HCT 35.5* 31.2*   PLT 185 176                                                                  Recent Labs     03/07/18  1752 03/07/18  1826 03/08/18  0545   NA 139 137 138   K 3.9 3.8 3.8   CL 103  --  103   CO2 26  --  27   BUN 21  --  18   CREATININE 1.13  --  1.07   GLUCOSE 161* 159* 164*     Recent Labs     03/07/18  1752   INR 1.1     No results for input(s): AST, ALT in the last 72 hours.    Invalid input(s): TOTAL BILIRUBIN      Assessment & Plan:     Community acquired pneumonia  Leukocytosis  Afib  DM  HTN  Hypothyroidism  OSA    Continue empiric IV antibiotics  Encourage pulmonary toileting  Recheck CBC tomorrow  Likely dc in 1-2 days pending clinical progress    Full Code    Electronically signed,  Duayne Cal, M.D.  Pager: [phone]

## 2018-03-08 NOTE — Unmapped (Signed)
[facility]  Case Management/Social Work Department  Discharge Planning Chart Screen  Patient Information     Hospital day: 1  Inpatient/Observation:  Inpatient   Admit date:  03/07/2018  Admission diagnosis: Hypoxia [R09.02]  Chronic a-fib [I48.20]  Pneumonia of right lower lobe due to infectious organism (CMS Dx) [J18.1]    PMH:  has a past medical history of Atrial fibrillation (CMS Dx), Cataract, Diabetes mellitus (CMS Dx), Dilated cardiomyopathy (CMS Dx), Hearing loss, Hypercholesteremia, Hypertension, Sleep apnea, and Thyroid disease.    PCP:  Marcello Fennel, MD  *If patient does not have a PCP, a list will be provided.    Home Pharmacy:    [facility] [address] [place], [place] - [address] AT [facility] [address]  [address]  [place] [address]  Phone: [phone]         Medical Insurance Coverage:  Payor: MEDICARE / Plan: MEDICARE A AND B / Product Type: Medicare /     Living Arrangements prior to admission:  Home with spouse  Level of independence prior to admission:  ind without AD  Anticoagulation prior to admission: on apixaban prior  BARRIERS TO DISCHARGE: none per review    Support Systems     Contact person/Caregiver:  Extended Emergency Contact Information  Primary Emergency Contact: [place]  Address: [address] [place], [place] [address] [place]  Home Phone: [phone]  Mobile Phone: [phone]  Relation: Spouse  Preferred language: English  Interpreter needed? No    Next-of-Kin:  spouse  HCPOA: none on file  Guardian: none on file    Other Pertinent Information     ?? Weaned to room air  ?? Cultures pending  ?? PT rec: no needs      Discharge Plan     Anticipated discharge plan: Return home, no hhc or dme needs    Anticipated discharge date: 03/09/18     CM/SW will continue to follow and remain available for continued discharge planning.    Jacklynn Lewis BSN, CCM , RN  RN Case Manager  [phone] Ascom  [phone]  Fax    Patient/Family aware and taking part in the discharge plan.  Patient/family were offered a post-acute provider list as applicable to the discharge plan and insurance provider.  Patient/family were given the freedom to choose providers and financial interest(s) were disclosed as appropriate.

## 2018-03-08 NOTE — Unmapped (Signed)
Urine sent to lab. Awaiting sputum for sputum sample.

## 2018-03-08 NOTE — Unmapped (Signed)
Problem: Glucose Imbalance related to diabetes disease process  Goal: Clinical indication of glucose balance is achieved  Outcome: Progressing     Problem: Knowledge deficit related to self-management of chronic disease  Goal: Patient/family/caregiver demonstrates understanding of disease process, treatment plan, medications, and discharge instructions  Outcome: Progressing     Problem: Potential for imbalanced nutrition related to metabolic effect of diabetes  Goal: Patient's nutritional needs will be met  Outcome: Progressing

## 2018-03-08 NOTE — Unmapped (Signed)
Speech Language Pathology  Clinical Swallow Assessment/Tentative Discharge  Patient was seen this date for initial clinical swallow assessment. Patient is considered safe to resume on a regular diet and thin liquids. Clinician provided rationale for her visit and her role when he endorsed choking and aspirating on his breakfast yesterday morning. He reports being a fast eater. He also endorsed have hiatal hernia and reflux.   Safe swallow precautions and GERD precautions are exceptionally important to implement in everyday practice.   Recommendations:  1. Regular diet  2. Thin liquids  3. Safe swallow precautions  4. GERD precautions/Hiatal Hernia  5. Speech Therapy targeting dysphagia and education     Thin liquids only trialed this session    Name: [name]  DOB: [birth year]  Attending Physician: Glade Stanford,*  Admission Diagnosis: Hypoxia [R09.02]  Chronic a-fib [I48.20]  Pneumonia of right lower lobe due to infectious organism (CMS Dx) [J18.1]  Date: 03/08/2018  Precautions: MRSA   Reviewed Pertinent hospital course: Yes  Hospital Course SLP: Per patients H&P .Marland KitchenMarland KitchenAccording to pt he was feeling weak today and had an episode where he was so weak he had to sit down on ground and call EMS. He had one episode of nausea and emesis while waiting for EMS. He also had some hemoptysis in ED. He denies shortness of breath, fever, chills, abd pain, rhinorrhea, sore throat, diarrhea, constipation....     02/25/2018 CXR  IMPRESSION:   Right middle lobe opacification, concerning for pneumonitis. Follow-up chest radiograph after appropriate treatment is recommended to document resolution.  ??  Orientation:  Person: Yes  Place: Yes  Time: Yes  Situation: Yes    Pain:  Pain Score: 0-No pain    Aspiration Risk  Risk for Aspiration: Mild  Aspiration Risk Recommendations: Dysphagia treatment  Dysphagia Diagnosis: Mild oropharyngeal stage dysphagia  Compensatory Swallowing Strategies: Upright as possible for all  oral intake, Alternate solids and liquids, Small bites/sips, Multiple swallows per bite/sip, External pacing, Eat/feed slowly  Diet Solids Recommendation: Regular  Diet Liquids Recommendations: No Liquid Consistency Restrictions  Recommended Form of Meds: Whole    Goals     Goals Short Term Dysphagia  Patient and/or caregiver will demonstrate understanding of clinical signs and symptoms of aspiration at 80% accuracy: .  Patient and/or caregiver will demonstrate understanding of risks associated with aspiration at 100% accuracy: .  Time frame for goals to be met in: at least 3-7x/wk for up to 1 wks by 12/05  Goals Long Term Dysphagia  Patient will tolerate least restrictive diet with no signs or symptoms of aspiration : .  Time frame for goals to be met in: at least 3-7x/wk for up to 2 wks by 12/12     Recommendation  Recommendations  Recommendation: Recommend SLP therapy at discharge  Follow up treatments: Diet tolerance monitoring, Patient/Family education  Dysphagia Goals: Patient will tolerate recommended diet without observed clinical signs of aspiration    Baseline Assessment  Behavior/Cognition: Alert  Dentition: Adequate  Vision: Functional for self-feeding  Patient Positioning: Upright in chair  Volitional Cough: Strong  Volitional Swallow: WFL    Respiratory Status  Respiratory Status: Room air    Oral/Motor  Labial ROM: Within Functional Limits  Labial Strength: Within Functional Limits  Lingual ROM: Within Functional Limits  Lingual Strength: Within Functional Limits  Facial ROM: Within Functional Limits  Facial Strength: Within Functional Limits  Baseline Vocal Quality: Normal  Vocal Intensity: Within Functional Limits  Breath Support: Adequate for speech  Dentition: Adequate  Hearing Exceptions: None    Consistencies Assessed  Thin    Thin  Presentation: Cup  Oral: Within functional limits  Pharyngeal: Within Functional Limits;Throat clearing - delayed    Assessment:  Patient was seen this date for  initial clinical swallow assessment. Clinician provided rationale for her visit and her role when he endorsed choking and aspirating on his breakfast yesterday morning (breakfast egg sandwich) He denies recent hx of pneumonia and stated its been years.   Patient reported having a hiatal hernia and reflux, on medication for it. He also stated he is a fast eater.   Oral mech exam revealed structures intact and functional for feeding, swallow, and speech.   Patient has natural teeth and is able to feed self.     He was observed with thin liquids via cup and straw. Oral phase of swallow with thin liquids is considered WFL. He did exhibited delayed throat clear x1. Recommend follow-up visit for additional PO trials and education (safe swallow precautions and GERD, hiatal hernia)    Patient was seen this date for initial clinical swallow assessment. Patient is considered safe to resume on a regular diet and thin liquids. Clinician provided rationale for her visit and her role when he endorsed choking and aspirating on his breakfast yesterday morning. He reports being a fast eater. He also endorsed have hiatal hernia and reflux.   Safe swallow precautions and GERD precautions are exceptionally important to implement in everyday practice.   Recommendations:  1. Regular diet  2. Thin liquids  3. Safe swallow precautions  4. GERD precautions/Hiatal Hernia  5. Speech Therapy targeting dysphagia and education     Thin liquids only trialed this session     Patient and Family Education  Patient and Family Education: Patient educated on role of SLP, dysphagia, current POC, risks of aspiration, current diet recommendations, safe swallowing behaviors, discharge recommendations for SLP therapy, Patient educated on swallowing anatomy & physiology  Education response: Patient demonstrated understanding     Patient left sitting upright in bed with call light within reach. All needs met. Discussed results/recommendations with RN/Kayla     Who verbalized understanding. Safety handoff completed. All needs met.    If patient discharges before next speech therapy session then this initial evaluation should be considered his discharge as well. Patient is at risk for aspiration secondary to esophageal diagnoses.       Time  Start Time: 0855  Stop Time: 0911  Time Calculation (min): 16 min    Charges   $Clinical Swallow: 1 Procedure         Patient Class   Inpatient      [facility] MS, CCC-SLP  Ascom: [phone]  03/08/2018

## 2018-03-08 NOTE — Unmapped (Signed)
Physical Therapy  Initial Assessment and Discharge     Name: REG BIRCHER  DOB: 12-25-1940  Attending Physician: Glade Stanford,*  Admission Diagnosis: Hypoxia [R09.02]  Chronic a-fib [I48.20]  Pneumonia of right lower lobe due to infectious organism (CMS Dx) [J18.1]  Date: 03/08/2018  Reviewed Pertinent hospital course: Yes  Hospital Course PT/OT: Pt is a 77yo male presenting to [facility] 03/07/18 due to general weakness. Pt admitted with R lower lobe PNA  Precautions: full code  Activity Level: Activity as tolerated  Assessment  PT 6 Clicks  Help From Another Person Turning From Back to Side While Flat in Bed Without Using Siderails: A little  Help From Another Person Moving From Lying On Back To Sitting Without Using Siderails: A little  Help From Another Person Moving To And From Bed To Chair: A little  Help From Another Person Standing Up From Chair Using Your Arms: A little  Help From Another Person To Walk In Hospital Room: A little  Help From Another Person Climbing 3-5 Steps With A Railing: A little  PT 6 Clicks Score: 18           Prognosis: Good    Goals  Goals to be met by: (no acute care goals identified for functional mobility)  Patient Stated Goal #1: return home  Collaborated with: Patient   Recommendation  Plan  PT Frequency: One time visit--discharge from PT    Recommendation  Recommendation: Anticipate no further PT needed after discharge  Problem List  Patient Active Problem List   Diagnosis   ??? Other, multiple, and unspecified sites, insect bite, nonvenomous, without mention of infection(919.4)   ??? Hemangioma of intra-abdominal structures   ??? Other specified disorders of liver   ??? 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 Disorder of esophagus   ??? Chest pain   ??? Contact dermatitis and other eczema, due to unspecified cause   ??? Morbid obesity (CMS Dx)   ??? Obstructive sleep apnea   ??? Hypothyroidism   ??? Type II or unspecified type diabetes mellitus without mention of complication,  not stated as uncontrolled (CMS Dx)   ??? Other and unspecified hyperlipidemia   ??? Essential hypertension   ??? Atrial fibrillation (CMS Dx)   ??? Congestive heart failure (CMS Dx)   ??? Hemorrhoids   ??? Esophageal reflux   ??? Pain in limb   ??? Special screening for malignant neoplasm of prostate   ??? Multiple rib fractures   ??? OSA (obstructive sleep apnea)      Past Medical History  Past Medical History:   Diagnosis Date   ??? Atrial fibrillation (CMS Dx)     corrected with Mini-maze procedure   ??? Cataract     left eye   ??? Diabetes mellitus (CMS Dx)     type 2   ??? Dilated cardiomyopathy (CMS Dx)    ??? Hearing loss     left ear   ??? Hypercholesteremia    ??? Hypertension    ??? Sleep apnea    ??? Thyroid disease     hypothyroidism      Past Surgical History  Past Surgical History:   Procedure Laterality Date   ??? COLONOSCOPY W/ POLYPECTOMY      benign  has at 3 plus procedures   ??? HERNIA REPAIR  9/09    right inguinal, mini maze   ??? HERNIA REPAIR  2/10    herniated lungs bilateral (repaired with mesh)   ???  mini-maze      corrected his A-fib   ??? SEPTOPLASTY     ??? UVULOPALATOPHARYNGOPLASTY     ??? UVULOPALATOPHARYNGOPLASTY      has had esophageal dilations in the past (Dr.Decter)   ??? WISDOM TOOTH EXTRACTION       Patient Stated Goals  Patient Stated Goal #1: return home   Home Living/Prior Function  Patient able to provide accurate information at this time: Yes  Lives With: Spouse  Assistance available: 24 hour supervision  Type of Home: House  Home Entry: No steps to enter  Home Layout: Multi-level  Stairs within: 8 steps up to bedroom, 6 down to sunken living room  Bathroom Shower/Tub: Cabin crew: none  Home Equipment: None  Additional Comments: sleeps in an adjustable bed  Prior Function  Functional Mobility: Independent ( no assistive device)  Receives Help From: None needed prior to admission  ADL Assistance: Independent  IADL Assistance: Independent     Pain  Pain Score: 0-No  pain    Vision  Vision/Perception  Overall Vision/ Perception: Impaired  Impairments: Basic Vision  Current Vision: Wears glasses only for reading     Cognition  Overall Cognitive Status: Within Functional Limits  Orientation Level: Oriented X4    Neuromuscular  Overall Sensation: Within Functional Limits  Additional Comments: denies numbess/tingling          Upper Extremity  UE Assessment: Strength WFL ( at least 3+/5) as observed during functional activity    Lower Extremity  Lower Extremity  LE Assessment: Strength WFL (at least 3+/5) as observed during functional activity        Functional Mobility  Bed Mobility   Supine to Sit: Independent;head of bed elevated  Sit to Supine: (pt in chair at end of session)  Transfers  Sit to Stand: Modified independent  Bed to Chair: Modified independent(via AMB)  Gait  Distance (in feet): 200 feet x 2 bouts with seated break  Level of assistance: Supervision;Modified independent(supervision level, progressing to MI levl)  Assistive Device: None  Gait Characteristics: Steady  Stairs  Number of Stairs: 8  Stair Management Assistance: Modified independent  Stair Management Technique: One rail R;Alternating pattern  Balance  Sitting - Static: Independent  Sitting-Dynamic: Independent   Standing-Static: Modified Independent  Standing-Dynamic: Modified Independent    Position after Therapy and Safety Handoff  Position after treatment and safety handoff  Position after therapy session: Chair  Details: RN notified;Call light/ needs within reach  Alarms: (none, pt ok to be up ad lib per RN)       Outcome Measures  Outcome Measure  Tinetti Balance Assessment Tool: 25/28, low risk for falls          Patient Education   Benefits of increased OOB mobility and upright positioning.  Role of PT.     Time  Start Time: 0825  Stop Time: 0844  Time Calculation (min): 19 min    Charges   $PT Evaluation Low Complex 20 Min: 1 Procedure       CPT codes  A. Personal factors and/or Comorbidities /  Patient History that impacts plan of care:  See above PMH / PSH / and problem list.      Moderate Complexity: 1-2          B. An examination of body systems(s) musculoskeletal, neuromuscular, cardiovascular/pumonary, integumentary using standardized tests and measures:  Refer to above examination for details.     Low Complexity: 1-2 elements  C. A clinical presentation with:   Low Complexity:  Stable and/or uncomplicated Characteristics     D. Clinical decision making using standardized patient assessment instrument and/or measurable assessment of functional outcome.   Low Complexity 97161       Tanush Drees PT,DPT License # [id number]  03/08/2018  ASCOM: [phone]

## 2018-03-08 NOTE — Unmapped (Signed)
No needs identified via chart review.

## 2018-03-09 LAB — CBC
Hematocrit: 31 % (ref 38.5–50.0)
Hemoglobin: 10.1 g/dL (ref 13.2–17.1)
MCH: 26.7 pg (ref 27.0–33.0)
MCHC: 32.6 g/dL (ref 32.0–36.0)
MCV: 81.8 fL (ref 80.0–100.0)
MPV: 7 fL (ref 7.5–11.5)
Platelets: 174 10*3/uL (ref 140–400)
RBC: 3.79 10*6/uL (ref 4.20–5.80)
RDW: 15.9 % (ref 11.0–15.0)
WBC: 14.4 10*3/uL (ref 3.8–10.8)

## 2018-03-09 LAB — POC GLU MONITORING DEVICE
POC Glucose Monitoring Device: 181 mg/dL (ref 70–100)
POC Glucose Monitoring Device: 198 mg/dL (ref 70–100)

## 2018-03-09 MED ORDER — azithromycin (ZITHROMAX) 500 MG tablet
500 | ORAL_TABLET | Freq: Every day | ORAL | 0 refills | Status: AC
Start: 2018-03-09 — End: 2018-03-12

## 2018-03-09 MED ORDER — cefdinir (OMNICEF) 300 MG capsule
300 | ORAL_CAPSULE | Freq: Two times a day (BID) | ORAL | 0 refills | 7.00000 days | Status: AC
Start: 2018-03-09 — End: 2018-03-14

## 2018-03-09 MED FILL — CARVEDILOL 25 MG TABLET: 25 25 MG | ORAL | Qty: 1

## 2018-03-09 MED FILL — PROPAFENONE 150 MG TABLET: 150 150 MG | ORAL | Qty: 2

## 2018-03-09 MED FILL — PANTOPRAZOLE 40 MG TABLET,DELAYED RELEASE: 40 40 MG | ORAL | Qty: 1

## 2018-03-09 MED FILL — ASPIRIN 81 MG TABLET,DELAYED RELEASE: 81 81 MG | ORAL | Qty: 1

## 2018-03-09 MED FILL — ELIQUIS 5 MG TABLET: 5 5 mg | ORAL | Qty: 1

## 2018-03-09 MED FILL — SYNTHROID 100 MCG TABLET: 100 100 mcg | ORAL | Qty: 1

## 2018-03-09 NOTE — Unmapped (Signed)
[facility] Hospitalist Group  Discharge Summary      PATIENT INFORMATION   Patient's PCP:  Marcello Fennel, MD  Name: [name]  DOB: 1941-01-02  MRN: [id number]     Admit Date:  03/07/2018  Discharge Date:  03/09/2018  Patient Class:  Inpatient    ADMITTING / DISCHARGING PHYSICIAN   Admitting Physician:  Shari Heritage, DO    Discharge Physician:  Duayne Cal, M.D.    HOSPITAL COURSE     Discharge Diagnoses:     Community acquired pneumonia  Leukocytosis  Afib  DM  HTN  Hypothyroidism  OSA    Brief Hospital Course:   JET ARMBRUST is a 77 y.o. male who was admitted to the hospital on 03/07/2018 for weakness, subsequently found to have pneumonia. Treated with empiric antibiotic therapy with improvement in symptoms. Patient was clinically improved and medically stable for discharge on 03/09/2018.      PHYSICAL EXAM   Physical exam on day of hospital discharge:     BP 143/72 (BP Location: Right arm, Patient Position: Lying)    Pulse 76    Temp 98.2 ??F (36.8 ??C) (Oral)    Resp 18    Ht 6' (1.829 m)    Wt (!) 240 lb 1.6 oz (108.9 kg)    SpO2 97%    BMI 32.56 kg/m??     General Appearance:  Alert, cooperative, no distress, appears stated age    Head:  Normocephalic, without obvious abnormality, atraumatic    Eyes:  EOMI, sclera nonicteric   Throat:  Moist mucus membranes    Neck:  Supple, trachea midline   Lungs:  Clear to auscultation bilaterally, respirations unlabored    Heart:  Regular rate and rhythm, S1 and S2 normal   Abdomen:  Soft, non-tender, bowel sounds active all four quadrants   Extremities:  Extremities normal, no edema   Pulses:  2+ and symmetric    Skin:  Warm and dry   Neurologic:  Grossly nonfocal, moves all extremities         LABS   Labs prior to hospital discharge:  Recent Labs     03/07/18  1752 03/08/18  0545 03/09/18  0459   WBC 11.0* 22.5* 14.4*   HGB 11.6* 10.4* 10.1*   HCT 35.5* 31.2* 31.0*   PLT 185 176 174                                                                  Recent  Labs     03/07/18  1752 03/07/18  1826 03/08/18  0545   NA 139 137 138   K 3.9 3.8 3.8   CL 103  --  103   CO2 26  --  27   BUN 21  --  18   CREATININE 1.13  --  1.07   GLUCOSE 161* 159* 164*       SURGERIES         IMAGING STUDIES   X-ray Chest Pa And Lateral    Result Date: 03/07/2018  Exam: XR CHEST PA AND LATERAL Date: 03/07/2018 6:18 PM EST CLINICAL HISTORY: Cough, TECHNIQUE: 2 views of the chest. COMPARISON: CT abdomen pelvis dated February 07, 2013. FINDINGS: Medical Devices: None. Heart and  Mediastinum: Cardiomediastinal silhouette is within normal limits. Lungs and Pleura: Patchy opacification of the right middle lobe, obscuring the cardiac silhouette. No evidence of large pleural effusion or pneumothorax. Bones and Soft tissue: Multiple remote fracture deformities of the bilateral ribs.     IMPRESSION: Right middle lobe opacification, concerning for pneumonitis. Follow-up chest radiograph after appropriate treatment is recommended to document resolution. Approved by Macy Mis, MD on 03/07/2018 6:38 PM EST I have personally reviewed the images and I agree with this report. Report Verified by: Letitia Caul, MD at 03/07/2018 7:13 PM EST         ALLERGIES   No Known Allergies      DISCHARGE MEDICATIONS     Discharge Medication List as of 03/09/2018  1:00 PM      START taking these medications    Details   azithromycin (ZITHROMAX) 500 MG tablet Take 1 tablet (500 mg total) by mouth daily for 3 days., Starting Fri 03/09/2018, Until Mon 03/12/2018, Normal, Disp-3 tablet, R-0      cefdinir (OMNICEF) 300 MG capsule Take 1 capsule (300 mg total) by mouth 2 times a day for 5 days., Starting Fri 03/09/2018, Until Wed 03/14/2018, Normal, Disp-10 capsule, R-0         CONTINUE these medications which have NOT CHANGED    Details   apixaban (ELIQUIS) 5 mg Tab Take 5 mg by mouth 2 times a day., Historical Med      aspirin 81 MG EC tablet Take 81 mg by mouth daily., Until Discontinued, Historical Med      atorvastatin  (LIPITOR) 10 MG tablet Take 10 mg by mouth at bedtime. , Until Discontinued, Historical Med      carvedilol (COREG) 25 MG tablet Take 25 mg by mouth 2 (two) times daily.  , Starting 04/15/2008, Until Discontinued, Historical Med      chorionic gonadotropin, human 10,000 unit injection Inject 10,000 Units into the muscle once., Historical Med      glimepiride (AMARYL) 2 MG tablet Take 1 tablet by mouth  every morning before  breakfast, Historical Med      GLUC/CHON-MSM#1/C/MANG/BOS/BOR (OSTEO BI-FLEX ORAL) Take by mouth. OSTEO BI-FLEX JOINT SHIELD TABS , Starting 11/28/2008, Until Discontinued, Historical Med      glyBURIDE-metformin (GLUCOVANCE) 2.5-500 mg per tablet Take 2 tablets by mouth 2 times a day. , Starting 04/16/2008, Until Discontinued, Historical Med      levothyroxine (SYNTHROID, LEVOTHROID) 100 MCG tablet Take 100 mcg by mouth every morning. , Starting 03/14/2006, Until Discontinued, Historical Med      losartan-hydrochlorothiazide (HYZAAR) 100-25 mg per tablet Take 1 tablet by mouth daily., Historical Med      omeprazole (PRILOSEC) 40 MG capsule Take 40 mg by mouth every morning. , Starting 04/15/2008, Until Discontinued, Historical Med      propafenone (RYTHMOL) 225 MG tablet Take 225 mg by mouth 3 times a day., Historical Med      SAW PALMETTO XTR/ZINC PICOLIN (SAW PALMETTO EXTRACT ORAL) Take 2 tablets by mouth 2 times a day. , Starting 03/13/2006, Until Discontinued, Historical Med               DISPOSITION   home       DISCHARGE INSTRUCTIONS   Activity: activity as tolerated  Diet: regular diet    FOLLOW-UP   Call and schedule an appointment with your Primary Care Physician within 1 week of discharge.    Total time spent on this discharge was 31 minutes including time spent with  the patient, reviewing data, completing medical records, and communication with the patient.    Thank you for the opportunity to be involved in your patient's care. If you have any questions or concerns regarding this  hospitalization, please feel free to call me at [phone].    Electronically signed,  Duayne Cal, M.D.  03/09/2018  3:38 PM

## 2018-03-09 NOTE — Unmapped (Signed)
Community-Acquired Pneumonia, Adult  Pneumonia is an infection of the lungs. One type of pneumonia can happen while a person is in a hospital. A different type can happen when a person is not in a hospital (community-acquired pneumonia). It is easy for this kind to spread from person to person. It can spread to you if you breathe near an infected person who coughs or sneezes. Some symptoms include:  ?? A dry cough.  ?? A wet (productive) cough.  ?? Fever.  ?? Sweating.  ?? Chest pain.  Follow these instructions at home:  ?? Take over-the-counter and prescription medicines only as told by your doctor.  ?? Only take cough medicine if you are losing sleep.  ?? If you were prescribed an antibiotic medicine, take it as told by your doctor. Do not stop taking the antibiotic even if you start to feel better.  ?? Sleep with your head and neck raised (elevated). You can do this by putting a few pillows under your head, or you can sleep in a recliner.  ?? Do not use tobacco products. These include cigarettes, chewing tobacco, and e-cigarettes. If you need help quitting, ask your doctor.  ?? Drink enough water to keep your pee (urine) clear or pale yellow.  A shot (vaccine) can help prevent pneumonia. Shots are often suggested for:  ?? People older than 77 years of age.  ?? People older than 77 years of age:  ?? Who are having cancer treatment.  ?? Who have long-term (chronic) lung disease.  ?? Who have problems with their body's defense system (immune system).  You may also prevent pneumonia if you take these actions:  ?? Get the flu (influenza) shot every year.  ?? Go to the dentist as often as told.  ?? Wash your hands often. If soap and water are not available, use hand sanitizer.  Contact a doctor if:  ?? You have a fever.  ?? You lose sleep because your cough medicine does not help.  Get help right away if:  ?? You are short of breath and it gets worse.  ?? You have more chest pain.  ?? Your sickness gets worse. This is very serious if:  ?? You  are an older adult.  ?? Your body's defense system is weak.  ?? You cough up blood.  This information is not intended to replace advice given to you by your health care provider. Make sure you discuss any questions you have with your health care provider.  Document Released: 09/14/2007 Document Revised: 09/03/2015 Document Reviewed: 07/23/2014  Elsevier Interactive Patient Education ?? 2018 [employer]      Hypoxemia  Hypoxemia occurs when the blood does not contain enough oxygen. The body cannot work well when it does not have enough oxygen because every part of the body needs oxygen. Oxygen enters the lungs when we breathe in, then it travels to all parts of the body through the blood. Hypoxemia can develop suddenly or slowly.  What are the causes?  Common causes of this condition include:  ?? Long-term (chronic) lung diseases, such as chronic obstructive pulmonary disease (COPD) or interstitial lung disease.  ?? Disorders that affect breathing at night, such as sleep apnea.  ?? Fluid buildup in the lungs (pulmonary edema).  ?? Lung infection (pneumonia).  ?? Lung or throat cancer.  ?? Abnormal blood flow that bypasses the lungs (having a shunt).  ?? Certain diseases??that affect nerves or muscles.  ?? A collapsed lung (pneumothorax).  ??  A blood clot in the lungs (pulmonary embolus).  ?? Certain types of heart disease.  ?? Slow or shallow breathing (hypoventilation).  ?? Certain medicines.  ?? High altitudes.  ?? Toxic chemicals, smoke, and gases.  What are the signs or symptoms?  In some cases, there may be no symptoms of this condition. If you do have symptoms, they may include:  ?? Shortness of breath (dyspnea).  ?? Bluish color of the skin, lips, or nail beds.  ?? Breathing that is fast, noisy, or shallow.  ?? A fast heartbeat.  ?? Feeling tired or sleepy.  ?? Feeling confused or worried.  If hypoxemia develops quickly, you will likely have dyspnea. If hypoxemia develops slowly over months or years, you may not notice any  symptoms.  How is this diagnosed?  This condition is diagnosed by:  ?? A physical exam.  ?? Blood tests.  ?? A test that measures the percentage of oxygen in your blood (pulse oximetry). This is done with a sensor that is placed on your finger, toe, or earlobe.  How is this treated?  Treatment for this condition depends on the underlying cause of your hypoxemia. You will likely be treated with oxygen therapy to restore your blood oxygen level. Depending on the cause of your hypoxemia, you may need oxygen therapy for a short time (weeks or months), or you may need it for the rest of your life.  Your health care provider may also recommend other therapies to treat the underlying cause of your hypoxemia.  Follow these instructions at home:  ?? Take over-the-counter and prescription medicines only as told by your health care provider.  ?? If you are on oxygen therapy, follow oxygen safety precautions as directed by your health care provider. These may include:  ?? Always having a backup supply of oxygen.  ?? Not allowing anyone to smoke or have a fire around oxygen.  ?? Handling oxygen tanks carefully and as instructed.  ?? Do not use any products that contain nicotine or tobacco, such as cigarettes and e-cigarettes. If you need help quitting, ask your health care provider. Stay away from people who smoke.  ?? Keep all follow-up visits as told by your health care provider. This is important.  Contact a health care provider if:  ?? You have any concerns about your oxygen therapy.  ?? You have trouble breathing, even during or after treatment.  ?? You become short of breath when you exercise.  ?? You are tired when you wake up.  ?? You have a headache when you wake up.  Get help right away if:  ?? Your shortness of breath gets worse, especially with normal or minimal activity.  ?? You have a bluish color of the skin, lips, or nail beds.  ?? You become confused or you cannot think properly.  ?? You cough up dark mucus or blood.  ?? You have  chest pain.  ?? You have a fever.  Summary  ?? Hypoxemia occurs when the blood does not contain enough oxygen.  ?? Hypoxemia may or may not cause symptoms. Often, the main symptom is shortness of breath (dyspnea).  ?? Depending on the cause of your hypoxemia, you may need oxygen therapy for a short time (weeks or months), or you may need it for the rest of your life.  ?? If you are on oxygen therapy, follow oxygen safety precautions as directed by your health care provider.  This information is not intended to replace advice  given to you by your health care provider. Make sure you discuss any questions you have with your health care provider.  Document Released: 10/11/2010 Document Revised: 03/01/2016 Document Reviewed: 03/01/2016  Elsevier Interactive Patient Education ?? 2017 [employer].

## 2018-03-09 NOTE — Unmapped (Signed)
Upon d/c, care plan has been resolved, pt educated on disease process and reviewed relevant medications w/ next dose time.  Reviewed needed safety measures with pt and informed pt of up-coming appointment dates, times, and locations.  Escorted pt to front entrance where family was waiting for DC to home. Safety maintained.

## 2018-03-09 NOTE — Unmapped (Signed)
Speech Language Pathology  Speech Therapy note and discharge  Patient is considered safe to tolerate regular solids and thin liquids,  It is important for him to implement safe swallow precautions: alternating solids and liquids, take small bites, small drinks, small meals and frequent breaks when eating secondary to esophageal dysphagia.   Additionally, implementing GERD precautions.   Patient was educated on the above precautions and provided teach back with 100% accy.   Recommendations:  1. Discharged from speech therapy with the above recommendations for PO  2. Consider follow-up with GI if esophageal symptoms worsen (patient has hiatal hernia and GERD)  He is at risk for aspiration secondary to esophageal diagnoses.    Name: [name]  DOB: March 09, 1941  Attending Physician: Glade Stanford,*  Admission Diagnosis: Hypoxia [R09.02]  Chronic a-fib [I48.20]  Pneumonia of right lower lobe due to infectious organism (CMS Dx) [J18.1]  Date: 03/09/2018  Precautions: MRSA    Reviewed Pertinent hospital course: Yes  Hospital Course SLP: Per patients H&P .Marland KitchenMarland KitchenAccording to pt he was feeling weak today and had an episode where he was so weak he had to sit down on ground and call EMS. He had one episode of nausea and emesis while waiting for EMS. He also had some hemoptysis in ED. He denies shortness of breath, fever, chills, abd pain, rhinorrhea, sore throat, diarrhea, constipation....    Subjective   Patient was seen this date for speech therapy targeting dysphagia and education. Patient agreed to session and denied any concerns at this time. He stated he had his breakfast and made sure that he took small bites and took his time while eating. RN/Sarah in the room and reported patient tolerating his medication without difficulty.    He is currently on a regular diet with thin liquids. Encouraged to implement safe swallow precautions and GERD precautions with all meals  Objective   Goals Short Term  Dysphagia  Patient and/or caregiver will demonstrate understanding of clinical signs and symptoms of aspiration at 80% accuracy: Current. Patient was educated re: the anatomy and physiology of swallow, risk of aspiration and s/sx of aspiration, GERD precautions and triggers, and safe swallow precautions. Patient reported that he took his time this morning when eating his breakfast. He verbalized understanding and provided teach back with 100%. Goal Met  Patient and/or caregiver will demonstrate understanding of risks associated with aspiration at 100% accuracy: .Current. please see above goal. Goal Met.  Time frame for goals to be met in: at least 3-7x/wk for up to 1 wks by 12/05  Goals Long Term Dysphagia  Patient will tolerate least restrictive diet with no signs or symptoms of aspiration : Current. Patient was not seen this date with PO due to just having his breakfast meal and medications. RN in room and reported patient tolerating breakfast and medication without any difficulty. Patient denied any difficulty and politely declined PO trials offered this session.   Time frame for goals to be met in: at least 3-7x/wk for up to 2 wks by 12/12    Assessment  Patient verbalized and demonstrated understanding of education provided including: risk of aspiration, s/sx of aspiration, safe swallow precautions and GERD precautions independently. He is at risk for aspiration secondary to esophageal diagnoses.    Plan  Discharged from speech therapy.     Time  Start Time: 0843  Stop Time: 0856  Time Calculation (min): 13 min    Charges      $Swallow Tx : 1 Procedure  [address] [place], CCC-SLP  Ascom: [phone]  03/09/2018

## 2018-03-09 NOTE — Unmapped (Signed)
No significant events to report. Will continue to monitor.

## 2018-03-09 NOTE — Unmapped (Signed)
    Case Manager/Social Worker Discharge Summary     Patient name: [name]                                        Patient MRN: [id number]  DOB: [birth year]                              Age: 77 y.o.              Gender: male  Patient emergency contact: Extended Emergency Contact Information  Primary Emergency Contact: Diers,[name]  Address: [address] [place], [place] [address] [place]  Home Phone: [phone]  Mobile Phone: [phone]  Relation: Spouse  Preferred language: English  Interpreter needed? No      Attending provider: Glade Stanford,*  Primary care physician: Marcello Fennel, MD    The MD has indicated that the patient is ready for discharge. Chart reviewed. No needs identified. [name] will discharge home independently.   Transfer Mode/Level of Care: Family    Plan reviewed with MD and other members of the health care team: Yes  Care Plan Completed: Yes    No further CM/SW needs.    This plan has been reviewed with the multi-disciplinary team.

## 2018-03-09 NOTE — Unmapped (Signed)
Problem: Glucose Imbalance related to diabetes disease process  Goal: Clinical indication of glucose balance is achieved  Outcome: Progressing  Intervention: Assess for symptoms of hyper/hypoglycemia  Note:      Intervention: Monitor blood glucose levels as ordered (as needed)  Note:      Intervention: Administer medications as ordered (as needed)  Note:      Intervention: Notify physician of ineffective treatment plan (as needed)  Note:         Problem: Infection  Goal: Signs and symptoms of infections are decreased or avoided  Description  Assess and monitor patient for signs and symptoms of infection such as redness, warmth, discharge, and increased body temperature. Monitor and report abnormal lab values (ex-CBC and diff, serum protein, serum albumin, and cultures).  Wash hands properly before and after each patient care activity. Utilize standard precautions and use personal protective equipment (PPE) as indicated. Ensure aseptic care of all intravenous lines and invasive tubes/drains. Obtain immunization and exposure to communicable diseases history. Collaborate with interdisciplinary team and initiate plan and interventions as ordered.  Outcome: Progressing

## 2018-03-09 NOTE — Unmapped (Signed)
Pharmacy Apixaban Education     [name] is a 77 y.o. y.o. male receiving apixaban for atrial fibrillation    Patient and/or family were educated on the following points regarding apixaban therapy:   ??? Indication   ??? Indication for apixaban therapy   ??? Dose/interval and importance of consistent dosing  ??? Adherence   ??? Importance of taking apixaban as instructed and what to do in case of a late or missed dose   ??? Never double up on dosages  ??? Do not stop taking this drug without your physician's permission, even if you start to feel better  ??? Monitoring   ??? This medication does not require routine monitoring; however physician may order additional testing as needed  ??? Drug Interactions   ??? Potential interaction with OTC/Prescription/Herbal supplements interactions may make patient more likely to bleed/clot   ??? Do not start or stop taking any medication without the advice of your physician or pharmacist  ??? Adverse Drug Events   ??? Emphasized apixaban increases the risk of bleeding:   ??? Monitoring for signs/symptoms such as unusual bruising, nosebleeds, bleeding of the gums, cuts that take a long time to heal, blood in the urine or stool etc.  ??? Avoid situations in which you are more likely to fall or hit your head  ??? Other   ??? Advised to notify all physicians and dentist offices on current use and duration of treatment   ??? Avoid excessive alcohol consumption, using same pharmacy to fill prescriptions allows screening for drug interactions  ??? For those who may become pregnant: tell your physician if you plan to or become pregnant or breastfeed while taking this medication    Patient and/or family questions and concerns were addressed during the counseling session. Patient was provided with an informational leaflet. Education provided utilizing the teach back method. Barriers to apixaban education include: none    LINDSAY K BENEDIK, Pharm.D.    03/09/2018 1:22 PM  Phone: [phone]    For clinical pharmacy  assistance on evenings and weekends, please call [phone].

## 2018-03-26 ENCOUNTER — Encounter

## 2018-03-26 ENCOUNTER — Ambulatory Visit: Admit: 2018-03-26 | Discharge: 2018-03-26 | Payer: MEDICARE | Attending: Internal Medicine | Primary: Internal Medicine

## 2018-03-26 DIAGNOSIS — E119 Type 2 diabetes mellitus without complications: Secondary | ICD-10-CM

## 2018-03-26 LAB — POCT GLYCOSYLATED HEMOGLOBIN (HGB A1C): Hemoglobin A1C: 6.2 %

## 2018-03-26 MED ORDER — GLUCOSE BLOOD VI STRP
ORAL_STRIP | 5 refills | Status: DC
Start: 2018-03-26 — End: 2020-05-13

## 2018-03-26 NOTE — Assessment & Plan Note (Signed)
On coreg, rhthmol and asa.  Patient is compliant w medications, no side effects, effective, provides adequate symptom relief. No new symptoms or problems as noted by patient.  The problem is stable, no changes noted by patient. Will consider monitoring labs and refill medications as appropriate. Patient counseled and will continue current plan.

## 2018-03-26 NOTE — Assessment & Plan Note (Signed)
Has been rate controlled,  On eliquis,  Patient is compliant w medications, no side effects, effective, provides adequate symptom relief. No new symptoms or problems as noted by patient.  The problem is stable, no changes noted by patient. Will consider monitoring labs and refill medications as appropriate. Patient counseled and will continue current plan.

## 2018-03-26 NOTE — Progress Notes (Signed)
Subjective:      Patient ID: Carl Blair is a 77 y.o. male.    HPI  Established patient here for a visit to manage chronic medical conditions as detailed below.    Type 2 diabetes mellitus without complication, without long-term current use of insulin (HCC)  BP Readings from Last 2 Encounters:   03/26/18 118/76   09/06/17 126/80     Hemoglobin A1C (%)   Date Value   03/26/2018 6.2     Microscopic Examination (no units)   Date Value   09/22/2014 Not Indicated     Microalbumin, Random Urine (mg/dL)   Date Value   04/54/0981 1.40     LDL Calculated (mg/dL)   Date Value   19/14/7829 76              Tobacco use:  Patient  reports that he has never smoked. He has never used smokeless tobacco.    If Smoker - Cessation materials given? NA    Is Daily aspirin on medication list?:  Yes    Diabetic retinal exam done? Yes   If yes, document in Health Maintenance.     Monofilament placed on counter? Yes    Shoes and socks removed? Yes    BP taken with correct size cuff? Yes   Repeated if > 130/80 Yes     Microalbumin performed if applicable?  Yes  Diabetes Mellitus Type II:  Home blood sugar records reviewed: fasting range: 120-130.  No significant episodes of hypoglycemia. Compliant with medications.  No polyuria, polydipsia, visual changes, foot problems, GI upset.  Diabetic diet compliance:  compliant all of the time.  Current exercise: none. Will check labs, and refill medications as appropriate.    Hypertension:  Denies CP, SOB, visual changes, dizziness, palpitations or HA.  He is adherent to a low sodium diet.  Blood pressure typically runs ok  outside of the office. Continue current medications.    Hyperlipidemia:  No new myalgias or GI upset on current medications.       Lab Results   Component Value Date    LABA1C 6.2 03/26/2018    LABA1C 6.1 09/06/2017    LABA1C 7.5 04/14/2017     Lab Results   Component Value Date    LABMICR 1.40 06/04/2015    CREATININE 1.1 09/06/2017     Lab Results   Component Value Date     ALT 13 09/06/2017    AST 14 (L) 09/06/2017     No components found for: CHLPL  Lab Results   Component Value Date    TRIG 83 09/06/2017     Lab Results   Component Value Date    HDL 31 (L) 09/06/2017     Lab Results   Component Value Date    LDLCALC 76 09/06/2017             Pure hypercholesterolemia  This has been a long standing problem, takes statins,  Monitors diet and tries to follow a low fat diet. Has  been reasonably  compliant w exercise. Lipids have been stable, The problem is controlled. Recent lipid tests were reviewed and are normal. Pertinent negatives include no chest pain, focal sensory loss, focal weakness, leg pain, myalgias or shortness of breath.  Advised patient to continue the current instructions or medications.      Essential hypertension  This is a chronic problem. The problem is well controlled.  Patient monitors readings regularly. Pertinent negatives include no chest pain, focal  sensory loss, focal weakness, leg pain, myalgias or shortness of breath. No headaches or chest pain. Takes medications regularly.  Blood pressure has been stable, blood work was reviewed, and advised patient to continue the current instructions or medications.      Chronic a-fib (HCC)  Has been rate controlled,  On eliquis,  Patient is compliant w medications, no side effects, effective, provides adequate symptom relief. No new symptoms or problems as noted by patient.  The problem is stable, no changes noted by patient. Will consider monitoring labs and refill medications as appropriate. Patient counseled and will continue current plan.     Chronic systolic congestive heart failure (HCC)  On coreg, rhthmol and asa.  Patient is compliant w medications, no side effects, effective, provides adequate symptom relief. No new symptoms or problems as noted by patient.  The problem is stable, no changes noted by patient. Will consider monitoring labs and refill medications as appropriate. Patient counseled and will  continue current plan.       Review of Systems  ROS: No unusual headaches or allergy symptoms or blurred vision. No prolonged cough. No flushing or facial pain or chest pain,dizziness, dyspnea, palpitations, or chest pain on exertion. No syncope. No nausea or vommitting or diarrhea.  No jaundice or abdominal pain, change in bowel habits, black or bloody stools.  No dysuria or hematuria or frequency of urination.   No myalgias or muscle pain.  No numbness, weakness, or tingling. No falls, or loss of consciousness. No weight loss or back pain. No falls.  No paresthesias. No joint swelling or redness. No joint pain. No recent weight loss. No focal weakness or sensory deficits or paresthesias, No confusion or altered sensorium. No hematemesis. No hearing loss. No siezures. All other systems were reviewed, and review was negative.   Objective:   Physical Exam  BP 118/76 (Site: Left Upper Arm, Position: Sitting, Cuff Size: Large Adult)    Pulse 77    Ht 6' (1.829 m)    Wt 241 lb (109.3 kg)    SpO2 98%    BMI 32.69 kg/m??    The physical exam reveals a patient who appears well, alert and oriented x 3, pleasant, cooperative. Vitals are as noted. Head is atraumatic and normocephalic. Eyes reveal normal conjunctiva, cornea normal, pupils are equal and rective to light. Nasal mucosa is normal. Throat is normal without exudates. Ears reveal normal tympanic membranes.Neck is supple and free of adenopathy, or masses. No thyromegaly. No jugular venous distension. Lungs are clear to auscultation, no rales or rhonchi noted. Heart sounds are regular , no murmurs, clicks, gallops or rubs. Abdomen is soft, no tenderness, masses or organomegaly. Bowel sounds are normally heard.Pelvis: normal. Extremities are normal. Peripheral pulses are normal. Screening neurological exam is normal without focal findings. Cranial nerves are intact, reflexes are symmetrical and muscle strength eaqual. Skin is normal without suspicious lesions noted.      Assessment:      Type 2 diabetes mellitus without complication, without long-term current use of insulin (HCC)  BP Readings from Last 2 Encounters:   03/26/18 118/76   09/06/17 126/80     Hemoglobin A1C (%)   Date Value   03/26/2018 6.2     Microscopic Examination (no units)   Date Value   09/22/2014 Not Indicated     Microalbumin, Random Urine (mg/dL)   Date Value   16/01/9603 1.40     LDL Calculated (mg/dL)   Date Value   54/12/8117 76  Tobacco use:  Patient  reports that he has never smoked. He has never used smokeless tobacco.    If Smoker - Cessation materials given? NA    Is Daily aspirin on medication list?:  Yes    Diabetic retinal exam done? Yes   If yes, document in Health Maintenance.     Monofilament placed on counter? Yes    Shoes and socks removed? Yes    BP taken with correct size cuff? Yes   Repeated if > 130/80 Yes     Microalbumin performed if applicable?  Yes  Diabetes Mellitus Type II:  Home blood sugar records reviewed: fasting range: 120-130.  No significant episodes of hypoglycemia. Compliant with medications.  No polyuria, polydipsia, visual changes, foot problems, GI upset.  Diabetic diet compliance:  compliant all of the time.  Current exercise: none. Will check labs, and refill medications as appropriate.    Hypertension:  Denies CP, SOB, visual changes, dizziness, palpitations or HA.  He is adherent to a low sodium diet.  Blood pressure typically runs ok  outside of the office. Continue current medications.    Hyperlipidemia:  No new myalgias or GI upset on current medications.       Lab Results   Component Value Date    LABA1C 6.2 03/26/2018    LABA1C 6.1 09/06/2017    LABA1C 7.5 04/14/2017     Lab Results   Component Value Date    LABMICR 1.40 06/04/2015    CREATININE 1.1 09/06/2017     Lab Results   Component Value Date    ALT 13 09/06/2017    AST 14 (L) 09/06/2017     No components found for: CHLPL  Lab Results   Component Value Date    TRIG 83 09/06/2017     Lab Results    Component Value Date    HDL 31 (L) 09/06/2017     Lab Results   Component Value Date    LDLCALC 76 09/06/2017             Pure hypercholesterolemia  This has been a long standing problem, takes statins,  Monitors diet and tries to follow a low fat diet. Has  been reasonably  compliant w exercise. Lipids have been stable, The problem is controlled. Recent lipid tests were reviewed and are normal. Pertinent negatives include no chest pain, focal sensory loss, focal weakness, leg pain, myalgias or shortness of breath.  Advised patient to continue the current instructions or medications.      Essential hypertension  This is a chronic problem. The problem is well controlled.  Patient monitors readings regularly. Pertinent negatives include no chest pain, focal sensory loss, focal weakness, leg pain, myalgias or shortness of breath. No headaches or chest pain. Takes medications regularly.  Blood pressure has been stable, blood work was reviewed, and advised patient to continue the current instructions or medications.      Chronic a-fib (HCC)  Has been rate controlled,  On eliquis,  Patient is compliant w medications, no side effects, effective, provides adequate symptom relief. No new symptoms or problems as noted by patient.  The problem is stable, no changes noted by patient. Will consider monitoring labs and refill medications as appropriate. Patient counseled and will continue current plan.     Chronic systolic congestive heart failure (HCC)  On coreg, rhthmol and asa.  Patient is compliant w medications, no side effects, effective, provides adequate symptom relief. No new symptoms or problems as noted by  patient.  The problem is stable, no changes noted by patient. Will consider monitoring labs and refill medications as appropriate. Patient counseled and will continue current plan.           Plan:      Refill meds,   Will get blood work,  F/u in 6 months.        Marcello FennelPrashanth R Hyrum Shaneyfelt, MD

## 2018-03-26 NOTE — Assessment & Plan Note (Signed)
BP Readings from Last 2 Encounters:   03/26/18 118/76   09/06/17 126/80     Hemoglobin A1C (%)   Date Value   03/26/2018 6.2     Microscopic Examination (no units)   Date Value   09/22/2014 Not Indicated     Microalbumin, Random Urine (mg/dL)   Date Value   16/10/960402/23/2017 1.40     LDL Calculated (mg/dL)   Date Value   54/09/811905/29/2019 76              Tobacco use:  Patient  reports that he has never smoked. He has never used smokeless tobacco.    If Smoker - Cessation materials given? NA    Is Daily aspirin on medication list?:  Yes    Diabetic retinal exam done? Yes   If yes, document in Health Maintenance.     Monofilament placed on counter? Yes    Shoes and socks removed? Yes    BP taken with correct size cuff? Yes   Repeated if > 130/80 Yes     Microalbumin performed if applicable?  Yes  Diabetes Mellitus Type II:  Home blood sugar records reviewed: fasting range: 120-130.  No significant episodes of hypoglycemia. Compliant with medications.  No polyuria, polydipsia, visual changes, foot problems, GI upset.  Diabetic diet compliance:  compliant all of the time.  Current exercise: none. Will check labs, and refill medications as appropriate.    Hypertension:  Denies CP, SOB, visual changes, dizziness, palpitations or HA.  He is adherent to a low sodium diet.  Blood pressure typically runs ok  outside of the office. Continue current medications.    Hyperlipidemia:  No new myalgias or GI upset on current medications.       Lab Results   Component Value Date    LABA1C 6.2 03/26/2018    LABA1C 6.1 09/06/2017    LABA1C 7.5 04/14/2017     Lab Results   Component Value Date    LABMICR 1.40 06/04/2015    CREATININE 1.1 09/06/2017     Lab Results   Component Value Date    ALT 13 09/06/2017    AST 14 (L) 09/06/2017     No components found for: CHLPL  Lab Results   Component Value Date    TRIG 83 09/06/2017     Lab Results   Component Value Date    HDL 31 (L) 09/06/2017     Lab Results   Component Value Date    LDLCALC 76 09/06/2017

## 2018-03-26 NOTE — Assessment & Plan Note (Signed)
This is a chronic problem. The problem is well controlled.  Patient monitors readings regularly. Pertinent negatives include no chest pain, focal sensory loss, focal weakness, leg pain, myalgias or shortness of breath. No headaches or chest pain. Takes medications regularly.  Blood pressure has been stable, blood work was reviewed, and advised patient to continue the current instructions or medications.

## 2018-03-26 NOTE — Assessment & Plan Note (Signed)
This has been a long standing problem, takes statins,  Monitors diet and tries to follow a low fat diet. Has  been reasonably  compliant w exercise. Lipids have been stable, The problem is controlled. Recent lipid tests were reviewed and are normal. Pertinent negatives include no chest pain, focal sensory loss, focal weakness, leg pain, myalgias or shortness of breath.  Advised patient to continue the current instructions or medications.

## 2018-03-27 LAB — MICROALBUMIN / CREATININE URINE RATIO
Creatinine, Ur: 216.9 mg/dL (ref 39.0–259.0)
Microalb, Ur: 3.1 mg/dL — ABNORMAL HIGH (ref <2.0)
Microalb/Creat Ratio: 14.3 mg/g (ref 0.0–30.0)

## 2018-03-27 LAB — COMPREHENSIVE METABOLIC PANEL
ALT: 9 U/L — ABNORMAL LOW (ref 10–40)
AST: 12 U/L — ABNORMAL LOW (ref 15–37)
Albumin/Globulin Ratio: 2 (ref 1.1–2.2)
Albumin: 4.1 g/dL (ref 3.4–5.0)
Alkaline Phosphatase: 85 U/L (ref 40–129)
Anion Gap: 13 (ref 3–16)
BUN: 18 mg/dL (ref 7–20)
CO2: 26 mmol/L (ref 21–32)
Calcium: 8.9 mg/dL (ref 8.3–10.6)
Chloride: 103 mmol/L (ref 99–110)
Creatinine: 1.2 mg/dL (ref 0.8–1.3)
GFR African American: >60 (ref >60)
GFR Non-African American: 59 — AB (ref >60)
Globulin: 2.1 g/dL
Glucose: 141 mg/dL — ABNORMAL HIGH (ref 70–99)
Potassium: 4.8 mmol/L (ref 3.5–5.1)
Sodium: 142 mmol/L (ref 136–145)
Total Bilirubin: 0.4 mg/dL (ref 0.0–1.0)
Total Protein: 6.2 g/dL — ABNORMAL LOW (ref 6.4–8.2)

## 2018-03-27 LAB — CBC WITH AUTO DIFFERENTIAL
Basophils %: 0.8 %
Basophils Absolute: 0.1 10*3/uL (ref 0.0–0.2)
Eosinophils %: 3 %
Eosinophils Absolute: 0.2 10*3/uL (ref 0.0–0.6)
Hematocrit: 34.3 % — ABNORMAL LOW (ref 40.5–52.5)
Hemoglobin: 11.1 g/dL — ABNORMAL LOW (ref 13.5–17.5)
Lymphocytes %: 36.6 %
Lymphocytes Absolute: 2.7 10*3/uL (ref 1.0–5.1)
MCH: 26.8 pg (ref 26.0–34.0)
MCHC: 32.5 g/dL (ref 31.0–36.0)
MCV: 82.6 fL (ref 80.0–100.0)
MPV: 7.1 fL (ref 5.0–10.5)
Monocytes %: 9.2 %
Monocytes Absolute: 0.7 10*3/uL (ref 0.0–1.3)
Neutrophils %: 50.4 %
Neutrophils Absolute: 3.7 10*3/uL (ref 1.7–7.7)
Platelets: 206 10*3/uL (ref 135–450)
RBC: 4.15 M/uL — ABNORMAL LOW (ref 4.20–5.90)
RDW: 15.8 % — ABNORMAL HIGH (ref 12.4–15.4)
WBC: 7.3 10*3/uL (ref 4.0–11.0)

## 2018-03-27 LAB — LIPID PANEL
Cholesterol, Total: 119 mg/dL (ref 0–199)
HDL: 32 mg/dL — ABNORMAL LOW (ref 40–60)
LDL Calculated: 72 mg/dL (ref <100)
Triglycerides: 77 mg/dL (ref 0–150)
VLDL Cholesterol Calculated: 15 mg/dL

## 2018-03-27 LAB — TSH: TSH: 2.37 u[IU]/mL (ref 0.27–4.20)

## 2018-03-27 NOTE — Telephone Encounter (Signed)
Left message to call back.

## 2018-03-27 NOTE — Telephone Encounter (Signed)
-----   Message from Marcello FennelPrashanth R Kesav, MD sent at 03/27/2018 10:17 AM EST -----  Call and let patient them know that I reviewed all the lab results, and they are stable and ok. We can mail a copy of the results if the patient so wishes. The results should also be available on Mychart.

## 2018-04-20 NOTE — Unmapped (Signed)
LMOV to confirm appt 04/23/2018.    Complete.

## 2018-04-23 ENCOUNTER — Ambulatory Visit: Admit: 2018-04-23 | Discharge: 2018-04-23 | Payer: MEDICARE | Attending: Family

## 2018-04-23 DIAGNOSIS — R063 Periodic breathing: Secondary | ICD-10-CM

## 2018-04-23 NOTE — Unmapped (Signed)
Key points to remember  ?? You should wear the device every time you sleep, no matter when you sleep or where you sleep. When you nap or travel or go on vacation you need to use the machine when asleep.   ?? Bring your device, mask, hose, power cord etc to every office visit.   ?? If you sleep without the device your Obstructive Sleep Apnea (OSA) is untreated and your risk of heart attacks and strokes are higher.   ?? Clean the device on a regularly basis to avoid sinus infections and pneumonias   ?? Never travel with water in the reservoir   ?? Replace supplies on a regularly basis (see schedule below). You may need to contact your Durable Medical Equipment company (DME) and request supplies (just like you would medications from a pharmacy).  ?? Alcohol will worsen OSA, Alcohol can decrease your drive to breathe, slowing your breathing and making your breaths shallow. In addition, it may relax the muscles of your throat, which may make it more likely for your upper airway to collapse. REMEMBER TO ALWAYS WEAR YOUR PAP DEVICE WHEN SLEEPING.   ?? Certain medications can also worsen OSA such as some pain medications, anti-anxiety medications, muscle relaxants, testosterone supplementation. Please inform your sleep provider which medications you take on a regular basis.   ?? All patients are advised to make follow up appointments with the provider after they are set up with their PAP device. Most insurance plans now require the patient set up a follow up appointment with the provider 31-90 days after they have been set up with their device and at least once a year thereafter. Discuss these issues with your provider for more details.   ?? Please note when you are provided with a mask you have a manufacturer 30 day guarantee that comes with it. If you do not like that mask for any reason contact your DME and request an exchange. After 30 days you may not be eligible for a new mask for another 3-6 months depending on insurance. Talk  to your DME or sleep provider for more details.   ?? If you are struggling with nasal congestion try to use a nasal sinus rinse available over the counter such as Netti Pot 1 hour before bedtime (you may have to use it daily for a 7-10 days then as needed). You can also try over the counter Flonase or Nasacort 1-2 sprays in each nostril at bedtime.   ?? If you ever have problems with the device or the plan discussed with your sleep provider please contact your provider by phone or using MyChart. Thank you      CLEANING INSTRUCTIONS FOR PAP EQUIPMENT     Keeping your equipment and supplies clean is very important.     REMINDER: Only use DISTILLED WATER in your humidifier, Empty water daily.    DAILY  Mask and tubing:   ?? Wash your face before applying mask  ?? Wash mask and tubing with baby shampoo and warm water.     Humidifier:   ?? Empty water in reservoir  ?? Clean with baby shampoo and warm water  ?? Rinse, then air dry    WEEKLY  Mask and tubing:   ?? Soak your mask and tubing in 1 part vinegar and 3 parts water for 30 minutes. Rinse, and allow to air dry.     Humidifier:   ?? Wash with warm water and baby shampoo  ?? Soak in   1 part vinegar and 3 parts water for 30 minutes  ?? Rinse with warm water and allow to air dry    Machine Exterior:   ?? Wipe with a clean damp cloth    MONTHLY AND/OR AS NEEDED  ?? Reusable foam filters (black filter)- wash in warm water with baby shampoo. Rinse well and dry with paper towel  ?? Disposable felt filter (white filter)- Replace filter every two weeks to once a month    NOTE: If you are having repeated sinus and /or respiratory infections, dirty equipment may be the cause. It may help to clean and disinfect your equipment more frequently    TRAVELING  Always make sure the humidifier is empty when traveling. (including doctor appointments, air travel or long distance driving).  When flying, always carry your PAP device with you as a carry on item, NEVER check it in as baggage. We can  provide you with a letter stating it is a medical device, talk to your provider.    Always make sure you have your mask and tubing with you. You will need appropriate plug adapters when traveling outside the [place].    If travelling by air and you are unable to carry distilled water with you use bottled water (no more than 2 weeks). DO NOT USE TAP WATER.       Equipment Replacement Schedule    To get the most benefit from your PAP therapy, your equipment should be replaced when necessary based on wear and tear. For example, your mask may need to be replaced if you notice it is cracked or the seal is leaking. If your tubing is torn, it needs to be replaced.   If your equipment is showing signs of wear, you may be entitled to replace it. The replacement schedule for Medicare patients is shown below. If you are NOT a Medicare patient, please check with your DME ([employer] Medical [employer]) provider for your individual insurance policy???s replacement schedule.     Supplies   Medicare Medicaid My Insurance Plan  Mask    1 per 3 m 1 per year _______________  Nasal cushion  2 per m 1 per 6 m _______________  Pillows cushion  2 per m 1per 6 m _______________  Full-face cushion  1 per m 1per 6 m _______________  Headgear   1 per 6 m 1 per year _______________  Water Chamber  1 per 6 m N/A  _______________  Chinstrap   1 per 6 m 1 per 6 m       _______________  Tubing/Hose   1 per 3 m 1 per year _______________  Heater wire tubing  1 per 3 m N/A  _______________  Filter, disposable (white) 2 per m 1 per m _______________  Filter, particle foam (black) 1 per 6 m 4 per year _______________  Therapy device  1 per 5 yrs  ?  _______________    Follow up and compliance goals for Medicare and Medicaid patients   (most private insurances are now following similar rules)  Medicare and Medicaid require patients to follow-up with their provider after they are set up with their PAP equipment. The follow up appointment should be within  31-90 days after receiving their equipment. If patient does not follow up as directed there might be issues with the cost of the device being covered by the insurance. Therefore please ensure you make a follow up appointment with your sleep provider.      Insurance   also requires that the patient meet some compliance goals such as the download should indicate patient has been wearing the device for at least 4 hours per night AND they are using the device for 30 nights in a row.      HUMIDIFICATION feature on your PAP device. Most machines are pre-set for a humidity setting of 3 or 4. You can manually change the humidifier settings on your machine when you turn the machine on. Increase the humidity level one increment at a time.  Respironics device: Turn the on/off button to the left for less humidification or the right for more humidification. Humidification ranges from 1-5. If you have a heated hose talk to your provider or DME who can teach you how to make adjustments.     F&P device: Turn the large dial to the right for higher level and left for lower level. Humidification ranges from 1-7.     ResMed S9 device: Attach the humidifier component to the main unit, turn   the dial and choose the water droplet icon; press the button once. Then turn the dial to adjust your humidity setting. Press button a final time to save the setting. Humidification ranges from 1-8        Resmed S10 Device - This device has an Auto humidification feature.    If you have a heated hose it defaults to Auto Setting. You can change it to           manual setting under My Options. Switch Climate Control from Auto to            Manual. Then you will see two other options pop up. Tube Temperature and Humidity Level. Tube temperature is like your thermostat how hot or cold you want the air to be. It can range from 60-86 degree (most defaults to 81 degrees). Humidity Level is the next option this is the Moisture that is captured from the  distilled water placed in the chamber. It goes from 1-8. Higher the # more moister. SEE THIS VIDEO https://www.youtube.com/watch?[email or web address]&sns=em         For example, if you are waking up with a dry mouth, you should turn your humidifier up one number at a time. If you are getting excess condensation (water droplets) in your mask or tubing, you should turn the humidifier down one number at a time. In summer you need less humidity in winter you need more humidity. THIS NUMBER RARELY STAYS THE SAME ALL YEAR LONG. YOU HAVE TO CHANGE IT DEPENDING ON HOW YOU FEEL AND WHAT THE WEATHER IS OUTSIDE.     A prescription will be sent to a DME (medical supply company) covered by your insurance. The DME Company should contact you within 7-10 business days once they receive the prescription. If you have not received a call from the DME Company regarding your device, PLEASE CALL US so we may investigate further.   DROWSY DRIVING TIPS    These suggestions will help prevent you from the risk of drowsy driving.     1. If you feel tired or drowsy don't drive. Sleepiness is a major cause of motor vehicle accidents and accounts for 40% of all fatal crashes reported on the [place] thru[place]. No matter how much you think you can control sleepiness, you can't.     2. Ensure you follow your doctor's advice about the treatment for your sleep disorder. For example, if you have sleep apnea and use CPAP, ensure you use it   fully the night before your trip.     3. Get a good night's sleep before driving. Do not cut yourself short of sleep if you plan a long drive the next day. Get to bed early and do not stay up late packing.     4. Avoid alcohol both the night before your trip and during your trip. Alcohol will disrupt sleep and make you more tired the next day. Sleepiness and alcohol are additive in increasing impairment of your driving ability.     5. Avoid any sedative medications, including sedative antihistamines that are often contained in  cold or allergy medications, the night before you drive as they may have long lasting effects the next day.     6. Travel during non-sleeping hours. Accidents due to sleepiness are more common during the nighttime hours.     7. If sleepy, stop and rest. Drink coffee and walk around. Take a brief nap, lock your car doors and take a nap in your car if you are sleepy. Have a 10-15 minute break after every 2 hours of driving.     8. Drive with a companion. Share the driving. Relax in the back seat until it is your time to share the driving again.     These are only guidelines; please discuss your diagnosis and treatment with your sleep medicine provider. Drive safely.       Sleep Hygiene     Sleep disruption is common, especially during times when you may feel emotionally overwhelmed. Anxieties, relentless replay of the day???s events and heightened emotions may significantly interfere with your sleep. Lack of sleep robs you of needed rest, making management of your illness more difficult.   Bring sleep patterns under control and working at a consistent, stable pattern is very important to illness management. You need your rest. The most common cause of insomnia is a change in your daily routine. For example traveling, change in work hours, disruption of other behaviors (eating, exercise, leisure, etc.), relationship conflicts may cause sleep problems. Paying attention to good sleep hygiene is the most important thing you can do to maintain good sleep.     Do:   1. Go to bed at the same time each day.   2. Get up from bed at the same time each day.   3. Get regular exercise each day, preferably in the morning. There is good evidence that regular exercise improves restful sleep. This includes stretching and aerobic exercise.   4. Get regular exposure to outdoor or bright lights, especially in the late afternoon.   5. Keep the temperature in your bedroom comfortable.   6. Keep the bedroom quiet when sleeping   7. Keep the  bedroom dark enough to facilitate sleep.   8. Use your bed only for sleep and sex.   9. Take medications as directed. It???s often helpful to take prescribed sleeping pills one hour before bedtime, so they are causing drowsiness when you lie down, or 10 hours before getting up to avoid daytime drowsiness.   10. Use a relaxation exercise just before going to sleep, (muscle relaxation, imagery, massage, breathing exercises, warm baths).   11. Keep your feet and hands warm. Wear warm socks and/or mittens or gloves to bed.     Don???t:   1. Exercise just before going to bed.   2. Engage in stimulating activity just before bed, such as playing a competitive game, watching an exciting program on TV or movie, or having   an important discussion with a loved one.   3. Have caffeine in the evening (coffee, many teas, chocolate, sodas, etc). No caffeine after noon.    4. Read or watch TV in bed.   5. Use alcohol to help you sleep.   6. Go to bed too hungry or too full.   7. Take another person???s sleeping pills.   8. Take over-the-counter sleeping pills without your doctor???s knowledge. Tolerance can develop rapidly with these medications. Diphenhydramine (an ingredient commonly found in over-the-counter sleep medications) can have serious side effects for elderly patients.   9. Take daytime naps.   10. Command yourself to go to sleep. This only makes your mind and body more alert.   11. Do not look at the time when you wake up in the middle of the night. Remove the clocks from the bedroom or set your alarm for the morning and have it face the wall.   If you lie in bed awake for more than 20-30 minutes, get up, go to a different room (or different part of the bedroom), and participate in a quiet activity (non-excitable reading or listen to soothing music) then return to bed when you feel sleepy. Do this as many times during the night as needed.

## 2018-04-23 NOTE — Unmapped (Signed)
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    Date of NPSG: 05/27/2003   SE %: 63.5   AHI: 52   Lowest p ox %: 73   NPSG performed at: [facility]     Date of CPAP: 12/06/2008   SE %: 76.5   AHI: 1.1   Lowest p ox %: 90.0   CPAP performed at: [facility] @ time of study: 265 lbs  DME Company: [employer]  Mask type: full face  cmH2O: 13     PAP TITRATION REPORT        Patient:  MACIAH, SCHWEIGERT Date: 03/09/2017   DOB: 1941-01-09 PSG Study #: 490/[id number]   Age:  67 year Referring Physician: [name] [phone])   Sex:  Male PSG Tech: LADEAN NAPIER, RPSGT   Height(in.): 6' Scored by: Joie Bimler, RPSGT   Weight(lbs): 257.0 lbs Ordering Provider: Lieutenant Diego ([id number])   BMI: 35.2 Neck Size: -       Impressions:  1. Persistent central apneas with an index greater than 18 per hour on fullface mask BiPAP.  2. Improvement in sleep architecture and oxygenation with full facemask ASV configured as follows: EPAP 6 cm of water, pressure support minimum 0 cm of water, pressure support maximum 20 cm water, maximum machine pressure 25 cm water.  3. Exclusively supine sleep observed during this study.  4. Reduced sleep efficiency.    Methodology:  This was an attended polysomnography examination performed by a registered polysomnography technician.  An XL Tech acquisition unit was utilized. Data channels included right and left EOG, submental electromyogram, six electroencephalogram channels (including bilateral central, frontal and occipital leads), electrocardiogram, PTAF and/or thermistor, thoracic and abdominal respiratory impedance plethysmography, continuous pulse oximetry, and bilateral tibial leads. Sleep staging, arousal identification, respiratory event identification, leg movement identification was performed consistent with the American Association of sleep medicine guidelines. Hypopneas were defined per Medicare requirements requiring  a 4% or greater oxyhemoglobin desaturation, or if appropriate, the alternative hypopnea rule. Digital video was recorded and was synchronized to the electroencephalogram.  Data interpretation:  This study was reviewed on an epoch by epoch basis in its entirety by a board certified (American Association of Sleep Professionals and Special educational needs teacher) physician.  A polysomnography examination with BiPAP/ASV titration was performed.  This study included all supine sleep.  Sleep architecture was notable for increased wakefulness after sleep onset, reduced sleep efficiency and improved sleep quality on ASV therapy.  Pap was titrated with a ResMed F 20 fullface mask.  BiPAP pressures included 11-14/6.  Monitoring time at 13/6 cm water BiPAP pressure was quite short period ASV pressures were as follows:  EPAP minimum 6 cm water, EPAP maximum 16 cm water, pressure support minimum 0 cm of water, pressure support maximum 20 cm water, maximum machine pressure 25 cm water, backup rate = auto .  With respect to the BiPAP portion of the study REM sleep was captured at 13-14 centimeters of water.  Mild to moderate oxyhemoglobin desaturations occurred during REM sleep at pressures tested.  Respiratory events and effort were erratic.  Sleep was fragmented.  The central apnea index was greater than 18 per hour on BiPAP.  ASV was then introduced because of the recurrent central events.  EPAP was left in an auto titrating mode but on review of the pressure data never exceeded 7 cm water.  Respiratory effort modulation was present throughout the ASV portion  of the study.  Backup rate engaged on a modest number of occasions.  Overall sleep quality/architecture was better on ASV treatment utilizing the full facemask.  Baseline oxyhemoglobin saturation was normal at 96 percent.  The oxyhemoglobin saturation nadir for this study was 81 percent.  Cardiac rhythm was regular.  Occasional atrial and ventricular topics were  observed.  Periodic limb movements during sleep occurred occasionally but produced no significant independent sleep fragmentation.  No sleep-related seizure activity was identified.                     Georgian Co, M.D.        HPI: Shahzaib Azevedo Johnsonis a 78 y.o. 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 HE is doing well and has excellent compliance using his ASV 100% and 97% greater then 4 hrs. AHI is a little elevated 6.3/hr. HE feels great and go a night without using his machine. He likes his mask uses a FF mask, and pressure except when he wakes up in the middle of the night he feels its high. He did not realize he could turn it off and turn it back on and it will start lower. Mask leak has some facial hair. Suggested CPAP mask liner Remzz or Snugz. He has no rain out, does get a little dry mouth. He is getting supplies and cleaning his supplies he has a So Clean machine. He has no snoring that he is aware of on ASV, no drowsy driving or excessive daytime sleepiness     Hx of afib and CHF back in 2017 EF was good ECHO 04/19/2017 EF 55-60%         HTN, DM, atrial fib - corrected with mini-maze procedure) GERD, dylipidemia  Previous UPPP, oral venting and likes his FF mask    Sleep Schedule  Bedtime is 12a. Falls asleep within 15 minutes, with 1 nighttime awakenings for urinate. Falls back asleep in 15 minutes. Sleeps on own bed. Awakens at 7a during the week and same on the weekends.   Patient thinks they get approximately 6 hours of sleep a night.      Patient does takes naps 3 per week for 30 min he does not not use his ASV      DEVICE/ MASK/ DME INFORMATION  DME: medical services  Mask type: Airfit F10 large FF mask    Compliance Data Download: 05/17/2017-06/15/2017  Device Model: RESMED aircurve ASV 10  Mask: airfit F10 med  PAP Pressure: max EPAP 15 Min EPAP 6 max PS 15 min PS 4   average pressure EPAP 13.4  average IPAP 21.1  Percent Days with Device Usage: 100 %  Percent of Days  with usage >= 4 hours: 97 %  Average Usage (number of hours used all days): 8 hrs 7 min  AHI: 6.3  Leak 14.6 average max 29  Detailed report ran     EPWORTH SLEEPINESS SCALE: (at or above 10 is abnormal and 24 is the maximum score)  Epworth Sleepiness Scale 04/14/2011 02/22/2016 02/21/2017 03/16/2017 06/19/2017 04/23/2018   Sitting and reading 2 1 1  0 1 1   Watching TV 3 2 2 1 1 1    Sitting, inactive in a public place (e.g. a theatre or a meeting) 1 0 0 1 1 0   As a passenger in a car for an hour without a break 2 0 1 0 0 0   Lying down to rest in the afternoon when  circumstances permit 3 2 2 1 1 1    Sitting and talking to someone 0 0 0 0 0 0   Sitting quietly after a lunch without alcohol 1 1 0 0 0 0   In a car, while stopped for a few minutes in traffic 0 0 0 0 0 0   Total score 12 6 6 3 4 3             No other changes in his medical, surgical, social or family history or any hospitalizations or changes in medications since the last visit    Past Medical Hx:   Past Medical History:   Diagnosis Date   ??? Atrial fibrillation (CMS Dx)     corrected with Mini-maze procedure   ??? Cataract     left eye   ??? Diabetes mellitus (CMS Dx)     type 2   ??? Dilated cardiomyopathy (CMS Dx)    ??? Hearing loss     left ear   ??? Hypercholesteremia    ??? Hypertension    ??? Sleep apnea    ??? Thyroid disease     hypothyroidism     SurgHx:   Past Surgical History:   Procedure Laterality Date   ??? COLONOSCOPY W/ POLYPECTOMY      benign  has at 3 plus procedures   ??? HERNIA REPAIR  9/09    right inguinal, mini maze   ??? HERNIA REPAIR  2/10    herniated lungs bilateral (repaired with mesh)   ??? mini-maze      corrected his A-fib   ??? rt eye  02/2018   ??? SEPTOPLASTY     ??? UVULOPALATOPHARYNGOPLASTY     ??? UVULOPALATOPHARYNGOPLASTY      has had esophageal dilations in the past (Dr.Decter)   ??? WISDOM TOOTH EXTRACTION       Problem List:  Patient Active Problem List   Diagnosis   ??? Other, multiple, and unspecified sites, insect bite, nonvenomous, without mention  of infection(919.4)   ??? Hemangioma of intra-abdominal structures   ??? Other specified disorders of liver   ??? 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 Disorder of esophagus   ??? Chest pain   ??? Contact dermatitis and other eczema, due to unspecified cause   ??? Morbid obesity (CMS Dx)   ??? Obstructive sleep apnea   ??? Hypothyroidism   ??? Type II or unspecified type diabetes mellitus without mention of complication, not stated as uncontrolled (CMS Dx)   ??? Other and unspecified hyperlipidemia   ??? Essential hypertension   ??? Atrial fibrillation (CMS Dx)   ??? Congestive heart failure (CMS Dx)   ??? Hemorrhoids   ??? Esophageal reflux   ??? Pain in limb   ??? Special screening for malignant neoplasm of prostate   ??? Multiple rib fractures   ??? OSA (obstructive sleep apnea)     CURRENT MEDICATIONS:   Current Outpatient Medications   Medication Sig   ??? apixaban Take 5 mg by mouth 2 times a day.   ??? aspirin Take 81 mg by mouth daily.   ??? atorvastatin Take 10 mg by mouth at bedtime.    ??? carvediloL Take 25 mg by mouth 2 (two) times daily.     ??? glimepiride Take 1 tablet by mouth  every morning before  breakfast   ??? GLUC/CHON-MSM#1/C/MANG/BOS/BOR (OSTEO BI-FLEX ORAL) Take by mouth. OSTEO BI-FLEX JOINT SHIELD TABS    ??? glyBURIDE-metformin Take 2 tablets by  mouth 2 times a day.    ??? levothyroxine Take 100 mcg by mouth every morning.    ??? losartan-hydrochlorothiazide Take 1 tablet by mouth daily.   ??? omeprazole Take 40 mg by mouth every morning.    ??? propafenone Take 225 mg by mouth 3 times a day.   ??? SAW PALMETTO XTR/ZINC PICOLIN (SAW PALMETTO EXTRACT ORAL) Take 2 tablets by mouth 2 times a day.    ??? chorionic gonadotropin, human Inject 10,000 Units into the muscle once.     No current facility-administered medications for this visit.       Allergies  Allergies as of 04/23/2018   ??? (No Known Allergies)     Family Hx:   Family History   Problem Relation Age of Onset   ??? Diabetes Mother         type 2   ??? Hypertension Mother    ??? Coronary  artery disease Sister    ??? Diabetes Sister         type 2   ??? Coronary artery disease Sister    ??? Coronary artery disease Brother    ??? Hypertension Brother    ??? Diabetes Brother         type 2   ??? Hypertension Brother      Social Hx:   Patient  reports that he has never smoked. He has never used smokeless tobacco. He reports that he does not drink alcohol or use drugs.  Please see attached form for further details.     Review of Systems  General ROS: negative  Ophthalmic ROS: negative  Ears, nose, mouth, throat, and face ROS: negative  Respiratory ROS: all negative  Cardiovascular ROS: negative  Gastrointestinal ROS: negative  Genitourinary MVH:QIONGEXB 1 x per night  Integument/breast ROS: negative  Hematologic/lymphatic ROS: negative  Musculoskeletal MWU:XLKGMWNU  Neurological ROS: negative  Psychological ROS: all negative  Endocrine ROS: all  negative  Allergy and Immunology ROS: negative    Denies: suicidal ideas, irritability, depression, racing mind, changes in appetite, fainting spells, alcoholism, tremors, using illegal drugs, worrying about inability to sleep, insomnia, chest pain and shortness of breath,    I have reviewed the review of system and advised patient to contact primary care physician or specialist for non sleep related issues.    Previous Report(s) Reviewed: historical medical records, lab reports, office notes      The following portions of the patient's history were reviewed and updated as appropriate: allergies, current medications, past family history, past medical history, past social history, past surgical history and problem list.    OBJECTIVE :   BP 140/86    Pulse 74    Resp 16    Ht 6' 0.01 (1.829 m)    Wt (!) 248 lb 9.6 oz (112.8 kg)    SpO2 95%    BMI 33.71 kg/m??    Wt Readings from Last 3 Encounters:   04/23/18 (!) 248 lb 9.6 oz (112.8 kg)   03/09/18 (!) 240 lb 1.6 oz (108.9 kg)   01/24/18 (!) 241 lb (109.3 kg)       Physical Exam   Constitutional: alert and in no apparent  distress. Well-developed and well-nourished.   Head: normocephalic and atraumatic.   Ears: appear normal externally bilaterally  Eyes: Conjunctivae are normal. Pupils are equal, round, no scleral icterus, drainage or redness.   Nose: Columella normal. Positive for nasal congestion. No deviated septum.   Mouth : Mallampati 4. Crowded airway. Oropharynx  is clear, moist and pink. UPPP,  Macroglossia w/ ridging. Dentition poor missing teeth.  Neck:  grossly normal range of motion.  Trachea midline.    Cardiovascular: normal rate and regular rhythm. Neg murmur.   Pulmonary/Chest: effort normal and breath sounds normal. No stridor. No respiratory distress, no wheezes, no rales.   Abdominal: deferred   Musculoskeletal: Grossly normal range of motion and strength. Normal gait.   Neurological:  alert. Cranial nerves II-XII grossly intact. Strength 5/5 in upper extremities bilaterally. Normal gait.  No tremor.   Skin: skin is warm and dry. No rash noted. Multiple healing skin lesions on arms and face  Psychiatric: normal speech, normal mood and affect, behavior is normal. Judgment and thought content normal.     Labs Reviewed:   No results found for: IRON  Lab Results   Component Value Date    WBC 14.4 (H) 03/09/2018    HGB 10.1 (L) 03/09/2018    HCT 31.0 (L) 03/09/2018    MCV 81.8 03/09/2018    PLT 174 03/09/2018     Lab Results   Component Value Date    GLUCOSE 164 (H) 03/08/2018    BUN 18 03/08/2018    CO2 27 03/08/2018    CREATININE 1.07 03/08/2018    K 3.8 03/08/2018    NA 138 03/08/2018    CL 103 03/08/2018    CALCIUM 8.4 (L) 03/08/2018     Lab Results   Component Value Date    HGBA1C 6.6 (H) 03/08/2018     Lab Results   Component Value Date    TSH 1.50 04/15/2008    FREET4 1.6 04/15/2008     Transthoracic Echocardiography    Patient: ?? ??Jefry, Lesinski  MR #: ?? ?? ?? [id number]  Account:  Study Date: 02/17/2016  Gender: ?? ?? M  Age: ?? ?? ?? ??75  DOB: ?? ?? ?? ??07/29/1940  Room: ?? ?? ?? [facility]    ??ORDERING ?? ??Eda Keys,  MD  ??OPERATOR ?? ??Mardee Postin RVT  ??PERFORMING ??[facility], [place]    --------------------------------------------------------------------    Procedure:ECHO 2D ?? ?? ?? ??Order:ECHO 2D ?? ?? ?? ??Accession  COMPLETE (TTE) ?? ?? ?? ?? ?? COMPLETE (TTE) ?? ?? ?? Number:[id number]    --------------------------------------------------------------------  Indications: ?? ?? ??Atrial fibrillation - paroxysmal 427.31. ??PACs  427.61.    --------------------------------------------------------------------  PMH: ?? Atrial fibrillation. ??Risk factors: ??Hypertension. Diabetes  mellitus. Dyslipidemia.    --------------------------------------------------------------------  Study data: ?? Study status: ??Routine. ??Procedure: ??Transthoracic  echocardiography. Image quality was technically difficult. Scanning  was performed from the parasternal, apical, and subcostal acoustic  windows. ?? ?? ?? ?? ??Transthoracic echocardiography. ??M-mode, complete  2D, complete spectral Doppler, and color Doppler. ??Birthdate:  Patient birthdate: December 05, 1940. ??Age: ??Patient is 78yr old. ??Sex:  Gender: male. ??Height: ??Height: 72in. Height: 72in. ??Weight:  Weight: 259.5lb. Weight: 259.4lb. ??Body mass index: ??BMI:  35.3kg/m^2. ??Body surface area: ?? ??BSA: 2.76m^2. ??Blood pressure:  ??151/91 ??Patient status: ??Outpatient. ??Study date: ??Study date:  02/17/2016. Study time: 09:48 AM. ??Location: ??Echo laboratory.  ??  --------------------------------------------------------------------  Study Conclusions    - Left ventricle: The cavity size was normal. Wall thickness was  ????normal. Systolic function was normal. The estimated ejection  ????fraction was in the range of 60% to 65%. Wall motion was normal;  ????there were no regional wall motion abnormalities.  - Mitral valve: Mild regurgitation.  - Left atrium: The atrium was mildly dilated.  - Right ventricle: Systolic function was normal.  ASSESSMENT  Aris Even Johnsonis a 78 y.o. male          Central sleep apnea with  cheyenne stokes breathing on ASV. HE is doing well and has excellent compliance using his ASV 100% and 97% greater then 4 hrs. AHI is a little elevated 6.3/hr. HE feels great and go a night without using his machine. He likes his mask uses a FF mask, and pressure except when he wakes up in the middle of the night he feels its high. He did not realize he could turn it off and turn it back on and it will start lower. Mask leak has some facial hair. Suggested CPAP mask liner Remzz or Snugz. He has no rain out, does get a little dry mouth. He is getting supplies and cleaning his supplies he has a So Clean machine. He has no snoring that he is aware of on ASV, no drowsy driving or excessive daytime sleepiness     Hx of afib and CHF back in 2017 EF was good ECHO 04/19/2017 EF 55-60%         HTN, DM, atrial fib - corrected with mini-maze procedure) GERD, dylipidemia         Previous UPPP, oral venting and likes his FF mask    BMI 34    PLAN:   1. Central sleep apnea with cheynne stokes breathing on ASV AHI slightly elevated- no changes made has a mask leak suggested he try Remzzz liner or Snugz.   Reviewed  ASV download with pt  reviewed 4 day detailed report  Reviewed sleep study with pt  He can turn the machine off and then back on when he gets up in the middle of the night when he wakes up the pressure will start lower again.      DME : Medical services      Patient was educated regarding OSAHS pathophysiology, relationship with cardiovascular, cerebrovascular and metabolic morbidity and mortality, issues related to PAP adherence, its use, cleaning and maintenance.  if any issues were to arise before their next office visit they may follow up sooner or contact their DME company for assistance.  Patient has been advised that anything preventing them from using the device on a daily basis is a concern that should be relayed to [place] their sleep provider. Patient verbalized understanding and agrees to comply.     Exercise and  weight loss encouraged    The patient was educated on the dangers of drowsy driving; advised to avoid long distance or night driving. Instructed to take a short nap on the side of the road if sleepy when driving. Tips to avoid drowsy driving were provided to patient.     Sleep hygiene techniques were provided to patient.     Return to clinic 1 year if doing well sooner if having any problems    SLEEP QUALITY MEASURES:   Obstructive Sleep Apnea Follow Up Visit:   Adherence to therapy documented at least annually?: Yes    Reassessment of excessive daytime sleepiness?: Yes    Weight / body mass index assessment at visit?: Yes    If body mass index greater than 25 was discussion of weight management discussed? : Yes    Blood pressure assessed at visit? : Yes          Medical Decision Making:   The following items were considered in medical decision making:   Review / order other diagnostic tests/interventions   Risks & Benefits:  Risks, benefits and treatment options discussed with patient.

## 2018-04-23 NOTE — Unmapped (Signed)
Message sent to [employer] for ASV renewal.    Complete.

## 2018-05-21 MED ORDER — GLIMEPIRIDE 2 MG PO TABS
2 MG | ORAL_TABLET | ORAL | 1 refills | Status: DC
Start: 2018-05-21 — End: 2018-11-05

## 2018-05-21 MED ORDER — CARVEDILOL 25 MG PO TABS
25 MG | ORAL_TABLET | ORAL | 1 refills | Status: DC
Start: 2018-05-21 — End: 2018-11-05

## 2018-05-21 MED ORDER — ATORVASTATIN CALCIUM 20 MG PO TABS
20 MG | ORAL_TABLET | ORAL | 1 refills | Status: DC
Start: 2018-05-21 — End: 2018-11-05

## 2018-05-21 MED ORDER — JANUVIA 100 MG PO TABS
100 MG | ORAL_TABLET | ORAL | 1 refills | Status: DC
Start: 2018-05-21 — End: 2018-11-05

## 2018-05-21 NOTE — Telephone Encounter (Signed)
Medication:   Requested Prescriptions     Pending Prescriptions Disp Refills   ??? glimepiride (AMARYL) 2 MG tablet [Pharmacy Med Name: GLIMEPIRIDE  2MG   TAB] 180 tablet 1     Sig: TAKE 1 TABLET BY MOUTH TWO  TIMES DAILY WITH MEALS   ??? atorvastatin (LIPITOR) 20 MG tablet [Pharmacy Med Name: ATORVASTATIN  20MG   TAB] 90 tablet 1     Sig: TAKE 1 TABLET BY MOUTH  DAILY   ??? carvedilol (COREG) 25 MG tablet [Pharmacy Med Name: CARVEDILOL  25MG   TAB] 180 tablet 1     Sig: TAKE 1 TABLET BY MOUTH TWO  TIMES DAILY   ??? JANUVIA 100 MG tablet [Pharmacy Med Name: JANUVIA  100MG   TAB] 90 tablet 1     Sig: TAKE 1 TABLET BY MOUTH  DAILY     Last Filled:  12/21/2017    Last appt: 03/26/2018   Next appt: Visit date not found    Last Labs DM:   Lab Results   Component Value Date    LABA1C 6.2 03/26/2018

## 2018-09-10 ENCOUNTER — Ambulatory Visit: Admit: 2018-09-10 | Discharge: 2018-09-10 | Payer: MEDICARE | Attending: Internal Medicine | Primary: Internal Medicine

## 2018-09-10 ENCOUNTER — Encounter

## 2018-09-10 DIAGNOSIS — E119 Type 2 diabetes mellitus without complications: Secondary | ICD-10-CM

## 2018-09-10 LAB — POCT GLYCOSYLATED HEMOGLOBIN (HGB A1C): Hemoglobin A1C: 6.6 %

## 2018-09-10 NOTE — Assessment & Plan Note (Signed)
Diabetes Mellitus Type II:  Home blood sugar records reviewed: fasting range: 110-120.  No significant episodes of hypoglycemia. Compliant with medications.  No polyuria, polydipsia, visual changes, foot problems, GI upset.  Diabetic diet compliance:  compliant most of the time.  Current exercise: none. Will check labs, and refill medications as appropriate.    Hypertension:  Denies CP, SOB, visual changes, dizziness, palpitations or HA.  He is adherent to a low sodium diet.  Blood pressure typically runs ok  outside of the office. Continue current medications.    Hyperlipidemia:  No new myalgias or GI upset on current medications.       Lab Results   Component Value Date    LABA1C 6.2 03/26/2018    LABA1C 6.1 09/06/2017    LABA1C 7.5 04/14/2017     Lab Results   Component Value Date    LABMICR 3.10 (H) 03/26/2018    CREATININE 1.2 03/26/2018     Lab Results   Component Value Date    ALT 9 (L) 03/26/2018    AST 12 (L) 03/26/2018     No components found for: CHLPL  Lab Results   Component Value Date    TRIG 77 03/26/2018     Lab Results   Component Value Date    HDL 32 (L) 03/26/2018     Lab Results   Component Value Date    LDLCALC 72 03/26/2018         BP Readings from Last 2 Encounters:   09/10/18 120/72   03/26/18 118/76     Hemoglobin A1C (%)   Date Value   03/26/2018 6.2     Microscopic Examination (no units)   Date Value   09/22/2014 Not Indicated     Microalbumin, Random Urine (mg/dL)   Date Value   84/66/5993 3.10 (H)     LDL Calculated (mg/dL)   Date Value   57/04/7791 72              Tobacco use:  Patient  reports that he has never smoked. He has never used smokeless tobacco.    If Smoker - Cessation materials given? NA    Is Daily aspirin on medication list?:  Yes    Diabetic retinal exam done? Yes and No   If yes, document in Health Maintenance.     Monofilament placed on counter? Yes    Shoes and socks removed? Yes    BP taken with correct size cuff? Yes   Repeated if > 130/80 Yes     Microalbumin  performed if applicable?  Yes

## 2018-09-10 NOTE — Assessment & Plan Note (Signed)
On lasix, no cp or sob   On beta blocker,  Patient is compliant w medications, no side effects, effective, provides adequate symptom relief. No new symptoms or problems as noted by patient.  The problem is stable, no changes noted by patient. Will consider monitoring labs and refill medications as appropriate. Patient counseled and will continue current plan.

## 2018-09-10 NOTE — Assessment & Plan Note (Signed)
This has been a long standing problem, takes  lipitor     Monitors diet and tries to follow a low fat diet. Has  been reasonably  compliant w exercise. Lipids have been stable, The problem is controlled. Recent lipid tests were reviewed and are normal. Pertinent negatives include no chest pain, focal sensory loss, focal weakness, leg pain, myalgias or shortness of breath.  Advised patient to continue the current instructions or medications.

## 2018-09-10 NOTE — Assessment & Plan Note (Signed)
This is a chronic problem. The problem is well controlled.  Patient monitors readings regularly. Pertinent negatives include no chest pain, focal sensory loss, focal weakness, leg pain, myalgias or shortness of breath. No headaches or chest pain. Takes medications regularly.  Blood pressure has been stable, blood work was reviewed, and advised patient to continue the current instructions or medications.

## 2018-09-10 NOTE — Assessment & Plan Note (Signed)
No chest pain or sob or dizziness or palpitations. Afib is rate controlled, and will continue to titrate coumadin with a goal of a stable therapeutic INR of 2-3. Continue current medications for rate control. Will check monitoring labs as necessary.

## 2018-09-10 NOTE — Progress Notes (Signed)
Subjective:      Patient ID: Carl Blair is a 78 y.o. male.    HPI  Established patient here for a visit to manage acute and chronic medical conditions as detailed below.    Type 2 diabetes mellitus without complication, without long-term current use of insulin (HCC)  Diabetes Mellitus Type II:  Home blood sugar records reviewed: fasting range: 110-120.  No significant episodes of hypoglycemia. Compliant with medications.  No polyuria, polydipsia, visual changes, foot problems, GI upset.  Diabetic diet compliance:  compliant most of the time.  Current exercise: none. Will check labs, and refill medications as appropriate.    Hypertension:  Denies CP, SOB, visual changes, dizziness, palpitations or HA.  He is adherent to a low sodium diet.  Blood pressure typically runs ok  outside of the office. Continue current medications.    Hyperlipidemia:  No new myalgias or GI upset on current medications.       Lab Results   Component Value Date    LABA1C 6.2 03/26/2018    LABA1C 6.1 09/06/2017    LABA1C 7.5 04/14/2017     Lab Results   Component Value Date    LABMICR 3.10 (H) 03/26/2018    CREATININE 1.2 03/26/2018     Lab Results   Component Value Date    ALT 9 (L) 03/26/2018    AST 12 (L) 03/26/2018     No components found for: CHLPL  Lab Results   Component Value Date    TRIG 77 03/26/2018     Lab Results   Component Value Date    HDL 32 (L) 03/26/2018     Lab Results   Component Value Date    LDLCALC 72 03/26/2018         BP Readings from Last 2 Encounters:   09/10/18 120/72   03/26/18 118/76     Hemoglobin A1C (%)   Date Value   03/26/2018 6.2     Microscopic Examination (no units)   Date Value   09/22/2014 Not Indicated     Microalbumin, Random Urine (mg/dL)   Date Value   91/47/8295 3.10 (H)     LDL Calculated (mg/dL)   Date Value   62/13/0865 72              Tobacco use:  Patient  reports that he has never smoked. He has never used smokeless tobacco.    If Smoker - Cessation materials given? NA    Is Daily  aspirin on medication list?:  Yes    Diabetic retinal exam done? Yes and No   If yes, document in Health Maintenance.     Monofilament placed on counter? Yes    Shoes and socks removed? Yes    BP taken with correct size cuff? Yes   Repeated if > 130/80 Yes     Microalbumin performed if applicable?  Yes      Chronic a-fib (HCC)  No chest pain or sob or dizziness or palpitations. Afib is rate controlled, and will continue to titrate coumadin with a goal of a stable therapeutic INR of 2-3. Continue current medications for rate control. Will check monitoring labs as necessary.     Acquired hypothyroidism  On synthroid  Patient is compliant w medications, no side effects, effective, provides adequate symptom relief. No new symptoms or problems as noted by patient.  The problem is stable, no changes noted by patient. Will consider monitoring labs and refill medications as appropriate. Patient counseled  and will continue current plan.     Pure hypercholesterolemia  This has been a long standing problem, takes  lipitor     Monitors diet and tries to follow a low fat diet. Has  been reasonably  compliant w exercise. Lipids have been stable, The problem is controlled. Recent lipid tests were reviewed and are normal. Pertinent negatives include no chest pain, focal sensory loss, focal weakness, leg pain, myalgias or shortness of breath.  Advised patient to continue the current instructions or medications.      Chronic systolic congestive heart failure (HCC)  On lasix, no cp or sob   On beta blocker,  Patient is compliant w medications, no side effects, effective, provides adequate symptom relief. No new symptoms or problems as noted by patient.  The problem is stable, no changes noted by patient. Will consider monitoring labs and refill medications as appropriate. Patient counseled and will continue current plan.     Essential hypertension  This is a chronic problem. The problem is well controlled.  Patient monitors readings  regularly. Pertinent negatives include no chest pain, focal sensory loss, focal weakness, leg pain, myalgias or shortness of breath. No headaches or chest pain. Takes medications regularly.  Blood pressure has been stable, blood work was reviewed, and advised patient to continue the current instructions or medications.       Review of Systems  ROS: No unusual headaches or allergy symptoms or blurred vision. No prolonged cough. No flushing or facial pain or chest pain,dizziness, dyspnea, palpitations, or chest pain on exertion. No syncope. No nausea or vommitting or diarrhea.  No jaundice or abdominal pain, change in bowel habits, black or bloody stools.  No dysuria or hematuria or frequency of urination.   No myalgias or muscle pain.  No numbness, weakness, or tingling. No falls, or loss of consciousness. No weight loss or back pain. No falls.  No paresthesias. No joint swelling or redness. No joint pain. No recent weight loss. No focal weakness or sensory deficits or paresthesias, No confusion or altered sensorium. No hematemesis. No hearing loss. No siezures. All other systems were reviewed, and review was negative.   Objective:   Physical Exam  BP 120/72 (Site: Right Upper Arm, Position: Sitting, Cuff Size: Medium Adult)    Pulse 116    Resp 15    Wt 248 lb (112.5 kg)    SpO2 99%    BMI 33.63 kg/m??    The physical exam reveals a patient who appears well, alert and oriented x 3, pleasant, cooperative. Vitals are as noted. Head is atraumatic and normocephalic. Eyes reveal normal conjunctiva, cornea normal, pupils are equal and rective to light. Nasal mucosa is normal. Throat is normal without exudates. Ears reveal normal tympanic membranes.Neck is supple and free of adenopathy, or masses. No thyromegaly. No jugular venous distension. Lungs are clear to auscultation, no rales or rhonchi noted. Heart sounds are regular , no murmurs, clicks, gallops or rubs. Abdomen is soft, no tenderness, masses or organomegaly.  Bowel sounds are normally heard.Pelvis: normal. Extremities are normal. Peripheral pulses are normal. Screening neurological exam is normal without focal findings. Cranial nerves are intact, reflexes are symmetrical and muscle strength eaqual. Skin is normal without suspicious lesions noted.     Assessment:      Type 2 diabetes mellitus without complication, without long-term current use of insulin (HCC)  Diabetes Mellitus Type II:  Home blood sugar records reviewed: fasting range: 110-120.  No significant episodes of hypoglycemia. Compliant  with medications.  No polyuria, polydipsia, visual changes, foot problems, GI upset.  Diabetic diet compliance:  compliant most of the time.  Current exercise: none. Will check labs, and refill medications as appropriate.    Hypertension:  Denies CP, SOB, visual changes, dizziness, palpitations or HA.  He is adherent to a low sodium diet.  Blood pressure typically runs ok  outside of the office. Continue current medications.    Hyperlipidemia:  No new myalgias or GI upset on current medications.       Lab Results   Component Value Date    LABA1C 6.2 03/26/2018    LABA1C 6.1 09/06/2017    LABA1C 7.5 04/14/2017     Lab Results   Component Value Date    LABMICR 3.10 (H) 03/26/2018    CREATININE 1.2 03/26/2018     Lab Results   Component Value Date    ALT 9 (L) 03/26/2018    AST 12 (L) 03/26/2018     No components found for: CHLPL  Lab Results   Component Value Date    TRIG 77 03/26/2018     Lab Results   Component Value Date    HDL 32 (L) 03/26/2018     Lab Results   Component Value Date    LDLCALC 72 03/26/2018         BP Readings from Last 2 Encounters:   09/10/18 120/72   03/26/18 118/76     Hemoglobin A1C (%)   Date Value   03/26/2018 6.2     Microscopic Examination (no units)   Date Value   09/22/2014 Not Indicated     Microalbumin, Random Urine (mg/dL)   Date Value   16/10/960412/16/2019 3.10 (H)     LDL Calculated (mg/dL)   Date Value   54/09/811912/16/2019 72              Tobacco use:  Patient   reports that he has never smoked. He has never used smokeless tobacco.    If Smoker - Cessation materials given? NA    Is Daily aspirin on medication list?:  Yes    Diabetic retinal exam done? Yes and No   If yes, document in Health Maintenance.     Monofilament placed on counter? Yes    Shoes and socks removed? Yes    BP taken with correct size cuff? Yes   Repeated if > 130/80 Yes     Microalbumin performed if applicable?  Yes      Chronic a-fib (HCC)  No chest pain or sob or dizziness or palpitations. Afib is rate controlled, and will continue to titrate coumadin with a goal of a stable therapeutic INR of 2-3. Continue current medications for rate control. Will check monitoring labs as necessary.     Acquired hypothyroidism  On synthroid  Patient is compliant w medications, no side effects, effective, provides adequate symptom relief. No new symptoms or problems as noted by patient.  The problem is stable, no changes noted by patient. Will consider monitoring labs and refill medications as appropriate. Patient counseled and will continue current plan.     Pure hypercholesterolemia  This has been a long standing problem, takes  lipitor     Monitors diet and tries to follow a low fat diet. Has  been reasonably  compliant w exercise. Lipids have been stable, The problem is controlled. Recent lipid tests were reviewed and are normal. Pertinent negatives include no chest pain, focal sensory loss, focal weakness, leg pain, myalgias  or shortness of breath.  Advised patient to continue the current instructions or medications.      Chronic systolic congestive heart failure (HCC)  On lasix, no cp or sob   On beta blocker,  Patient is compliant w medications, no side effects, effective, provides adequate symptom relief. No new symptoms or problems as noted by patient.  The problem is stable, no changes noted by patient. Will consider monitoring labs and refill medications as appropriate. Patient counseled and will continue  current plan.     Essential hypertension  This is a chronic problem. The problem is well controlled.  Patient monitors readings regularly. Pertinent negatives include no chest pain, focal sensory loss, focal weakness, leg pain, myalgias or shortness of breath. No headaches or chest pain. Takes medications regularly.  Blood pressure has been stable, blood work was reviewed, and advised patient to continue the current instructions or medications.           Plan:      Will get blood work,  Refill meds,  F/u in 6 months./        Marcello Fennel, MD

## 2018-09-10 NOTE — Assessment & Plan Note (Signed)
On synthroid  Patient is compliant w medications, no side effects, effective, provides adequate symptom relief. No new symptoms or problems as noted by patient.  The problem is stable, no changes noted by patient. Will consider monitoring labs and refill medications as appropriate. Patient counseled and will continue current plan.

## 2018-09-11 LAB — CBC WITH AUTO DIFFERENTIAL
Basophils %: 0.6 %
Basophils Absolute: 0.1 10*3/uL (ref 0.0–0.2)
Eosinophils %: 3.5 %
Eosinophils Absolute: 0.3 10*3/uL (ref 0.0–0.6)
Hematocrit: 31.3 % — ABNORMAL LOW (ref 40.5–52.5)
Hemoglobin: 9.9 g/dL — ABNORMAL LOW (ref 13.5–17.5)
Lymphocytes %: 30.8 %
Lymphocytes Absolute: 2.8 10*3/uL (ref 1.0–5.1)
MCH: 24.2 pg — ABNORMAL LOW (ref 26.0–34.0)
MCHC: 31.5 g/dL (ref 31.0–36.0)
MCV: 76.8 fL — ABNORMAL LOW (ref 80.0–100.0)
MPV: 7 fL (ref 5.0–10.5)
Monocytes %: 8.9 %
Monocytes Absolute: 0.8 10*3/uL (ref 0.0–1.3)
Neutrophils %: 56.2 %
Neutrophils Absolute: 5.1 10*3/uL (ref 1.7–7.7)
Platelets: 209 10*3/uL (ref 135–450)
RBC: 4.08 M/uL — ABNORMAL LOW (ref 4.20–5.90)
RDW: 17.2 % — ABNORMAL HIGH (ref 12.4–15.4)
WBC: 9 10*3/uL (ref 4.0–11.0)

## 2018-09-11 LAB — COMPREHENSIVE METABOLIC PANEL
ALT: 10 U/L (ref 10–40)
AST: 12 U/L — ABNORMAL LOW (ref 15–37)
Albumin/Globulin Ratio: 2 (ref 1.1–2.2)
Albumin: 4.2 g/dL (ref 3.4–5.0)
Alkaline Phosphatase: 99 U/L (ref 40–129)
Anion Gap: 10 (ref 3–16)
BUN: 16 mg/dL (ref 7–20)
CO2: 26 mmol/L (ref 21–32)
Calcium: 8.9 mg/dL (ref 8.3–10.6)
Chloride: 103 mmol/L (ref 99–110)
Creatinine: 1 mg/dL (ref 0.8–1.3)
GFR African American: >60 (ref >60)
GFR Non-African American: >60 (ref >60)
Globulin: 2.1 g/dL
Glucose: 150 mg/dL — ABNORMAL HIGH (ref 70–99)
Potassium: 4.9 mmol/L (ref 3.5–5.1)
Sodium: 139 mmol/L (ref 136–145)
Total Bilirubin: <0.2 mg/dL (ref 0.0–1.0)
Total Protein: 6.3 g/dL — ABNORMAL LOW (ref 6.4–8.2)

## 2018-09-11 LAB — LIPID PANEL
Cholesterol, Total: 110 mg/dL (ref 0–199)
HDL: 29 mg/dL — ABNORMAL LOW (ref 40–60)
LDL Calculated: 63 mg/dL (ref <100)
Triglycerides: 91 mg/dL (ref 0–150)
VLDL Cholesterol Calculated: 18 mg/dL

## 2018-09-11 LAB — HEMOGLOBIN A1C
Hemoglobin A1C: 6.6 %
eAG: 142.7 mg/dL

## 2018-09-11 LAB — PSA SCREENING: PSA: 0.58 ng/mL (ref 0.00–4.00)

## 2018-09-11 LAB — TSH: TSH: 2.94 u[IU]/mL (ref 0.27–4.20)

## 2018-09-26 NOTE — Telephone Encounter (Signed)
Reason for Disposition  ??? Longstanding difficulty breathing (e.g., CHF, COPD, emphysema) and worse than normal    Answer Assessment - Initial Assessment Questions  1. RESPIRATORY STATUS: "Describe your breathing?" (e.g., wheezing, shortness of breath, unable to speak, severe coughing)       Short of breath with exertion  - each time he exertions himself  2. ONSET: "When did this breathing problem begin?"       A couple months ago  3. PATTERN "Does the difficult breathing come and go, or has it been constant since it started?"       *No Answer*  4. SEVERITY: "How bad is your breathing?" (e.g., mild, moderate, severe)     - MILD: No SOB at rest, mild SOB with walking, speaks normally in sentences, can lay down, no retractions, pulse < 100.     - MODERATE: SOB at rest, SOB with minimal exertion and prefers to sit, cannot lie down flat, speaks in phrases, mild retractions, audible wheezing, pulse 100-120.     - SEVERE: Very SOB at rest, speaks in single words, struggling to breathe, sitting hunched forward, retractions, pulse > 120       Moderate per pt  5. RECURRENT SYMPTOM: "Have you had difficulty breathing before?" If so, ask: "When was the last time?" and "What happened that time?"       Pt has had this breathing problem before - had surgery for afib - surgery worked for about 8 years, then it came back   6. CARDIAC HISTORY: "Do you have any history of heart disease?" (e.g., heart attack, angina, bypass surgery, angioplasty)       afib - takes meds as ordered .  CHF and Diabetes. Sees a cardiologist.  7. LUNG HISTORY: "Do you have any history of lung disease?"  (e.g., pulmonary embolus, asthma, emphysema)      Denies   8. CAUSE: "What do you think is causing the breathing problem?"       unsure  9. OTHER SYMPTOMS: "Do you have any other symptoms? (e.g., dizziness, runny nose, cough, chest pain, fever)      Coughing "a little bit " otherwise denies  10. PREGNANCY: "Is there any chance you are pregnant?" "When  was your last menstrual period?"        no  11. TRAVEL: "Have you traveled out of the country in the last month?" (e.g., travel history, exposures)        no    Protocols used: BREATHING DIFFICULTY-ADULT-OH    Pod 1    Patient called [facility]) to schedule appointment, with red flag complaint, transferred to RN access for triage.       Caller reports symptoms as documented above.  Caller informed of disposition, but does not want to set up an appointment - he says he just wants a covid test and he'll see his cardiologist for symptoms.  Explained that this writer can not order a test, pt verbalizes understanding and continues to state he does not want to make an appointment at this time.  Care advice as documented.    Please do not respond to the triage nurse through this encounter.  Any subsequent communication should be directly with the patient.

## 2018-11-05 MED ORDER — LEVOTHYROXINE SODIUM 100 MCG PO TABS
100 MCG | ORAL_TABLET | ORAL | 3 refills | Status: DC
Start: 2018-11-05 — End: 2019-06-17

## 2018-11-05 MED ORDER — JANUVIA 100 MG PO TABS
100 MG | ORAL_TABLET | ORAL | 3 refills | Status: DC
Start: 2018-11-05 — End: 2019-06-17

## 2018-11-05 MED ORDER — ATORVASTATIN CALCIUM 20 MG PO TABS
20 MG | ORAL_TABLET | ORAL | 3 refills | Status: DC
Start: 2018-11-05 — End: 2019-06-17

## 2018-11-05 MED ORDER — METFORMIN HCL 1000 MG PO TABS
1000 MG | ORAL_TABLET | ORAL | 3 refills | Status: AC
Start: 2018-11-05 — End: ?

## 2018-11-05 MED ORDER — GLIMEPIRIDE 2 MG PO TABS
2 MG | ORAL_TABLET | ORAL | 3 refills | Status: DC
Start: 2018-11-05 — End: 2019-06-17

## 2018-11-05 MED ORDER — OMEPRAZOLE 40 MG PO CPDR
40 MG | ORAL_CAPSULE | ORAL | 3 refills | Status: DC
Start: 2018-11-05 — End: 2019-06-17

## 2018-11-05 MED ORDER — CARVEDILOL 25 MG PO TABS
25 MG | ORAL_TABLET | ORAL | 3 refills | Status: DC
Start: 2018-11-05 — End: 2019-06-17

## 2018-11-05 NOTE — Telephone Encounter (Signed)
Medication:   Requested Prescriptions     Pending Prescriptions Disp Refills   ??? omeprazole (PRILOSEC) 40 MG delayed release capsule [Pharmacy Med Name: OMEPRAZOLE  40MG   CAP] 90 capsule 3     Sig: TAKE 1 CAPSULE BY MOUTH  DAILY   ??? carvedilol (COREG) 25 MG tablet [Pharmacy Med Name: CARVEDILOL  25MG   TAB] 180 tablet 3     Sig: TAKE 1 TABLET BY MOUTH TWO  TIMES DAILY   ??? atorvastatin (LIPITOR) 20 MG tablet [Pharmacy Med Name: ATORVASTATIN  20MG   TAB] 90 tablet 3     Sig: TAKE 1 TABLET BY MOUTH  DAILY   ??? glimepiride (AMARYL) 2 MG tablet [Pharmacy Med Name: GLIMEPIRIDE  2MG   TAB] 180 tablet 3     Sig: TAKE 1 TABLET BY MOUTH TWO  TIMES DAILY WITH MEALS   ??? JANUVIA 100 MG tablet [Pharmacy Med Name: JANUVIA  100MG   TAB] 90 tablet 3     Sig: TAKE 1 TABLET BY MOUTH  DAILY   ??? levothyroxine (SYNTHROID) 100 MCG tablet [Pharmacy Med Name: LEVOTHYROXINE  100MCG  TAB] 90 tablet 3     Sig: TAKE 1 TABLET BY MOUTH  DAILY   ??? metFORMIN (GLUCOPHAGE) 1000 MG tablet [Pharmacy Med Name: MetFORMIN 1000MG  TABLET] 180 tablet 3     Sig: TAKE 1 TABLET BY MOUTH  TWICE A DAY WITH MEALS     Last Filled:  12/21/2017    Last appt: 09/10/2018   Next appt: Visit date not found    Last Labs DM:   Lab Results   Component Value Date    LABA1C 6.6 09/10/2018

## 2019-03-21 IMAGING — CR KNEE RT 4 VWS MIN
1 series · 4 of 4 positions shown · non-contrast
Comparison: None.

HISTORY: Right knee pain.
TECHNIQUE: Right knee, four views.

[Series 1: tunnel · 0.17mm/px · 4 of 4 slices shown]
[im 1/4]
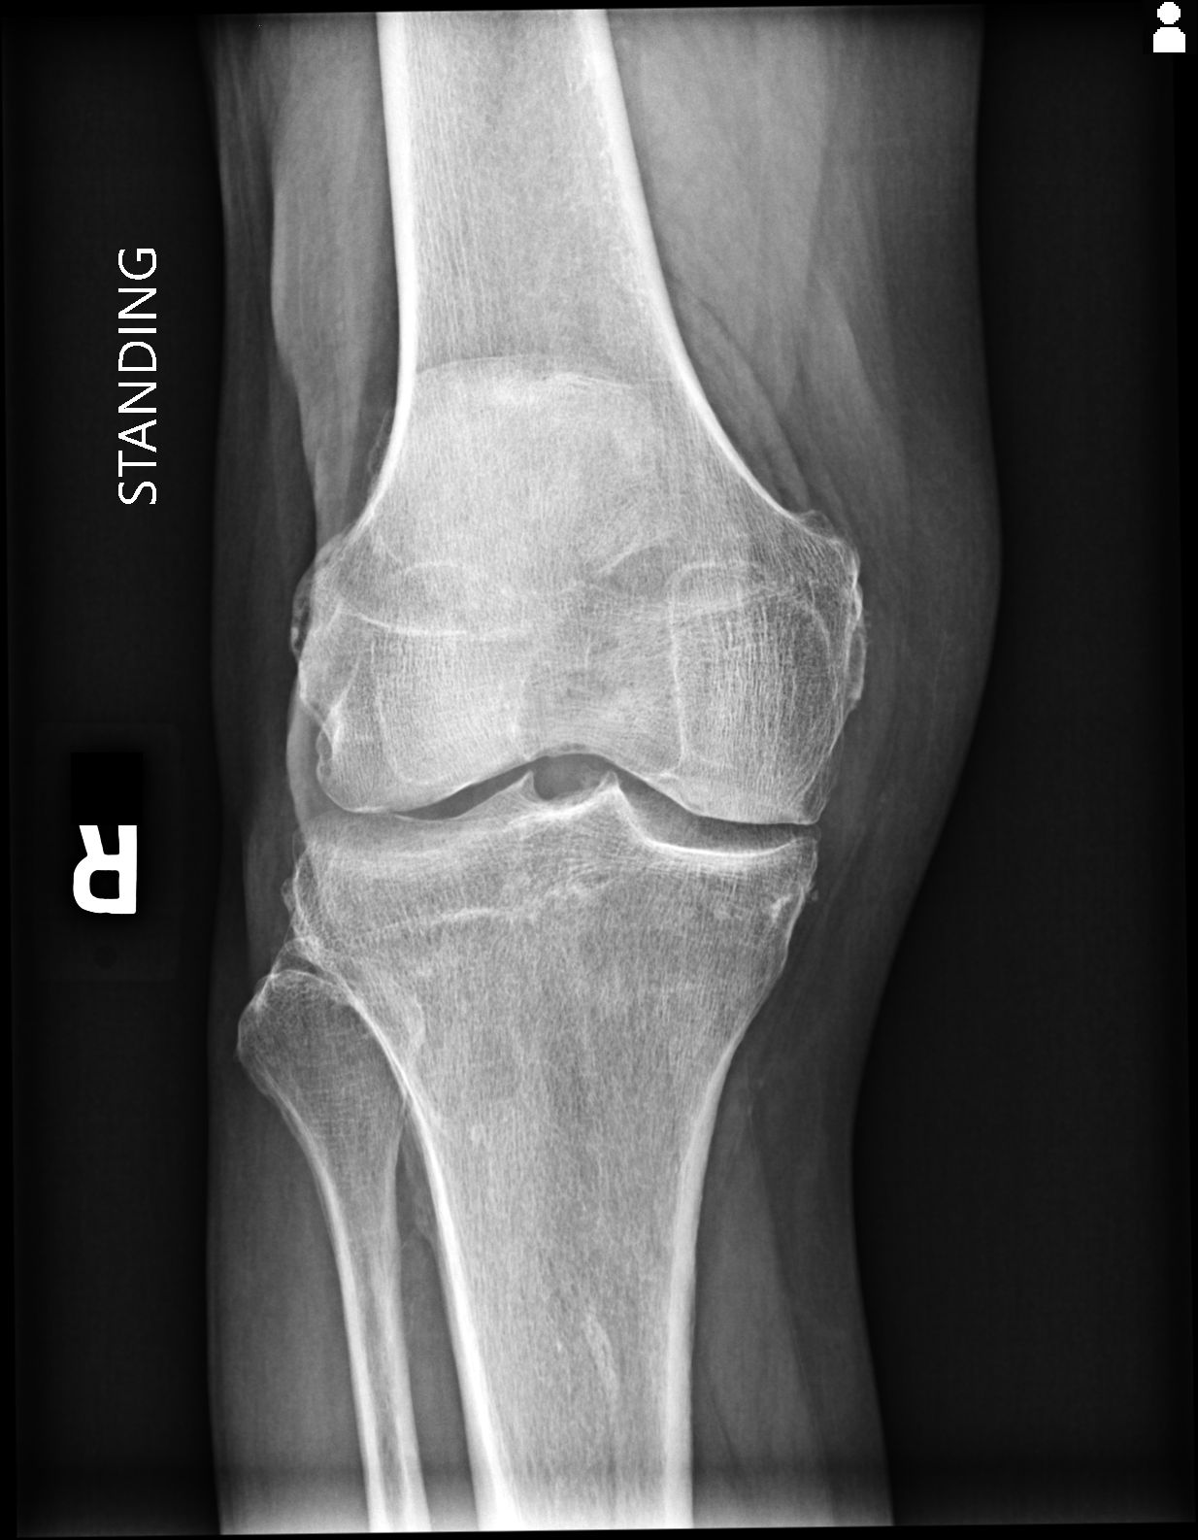
[im 2/4]
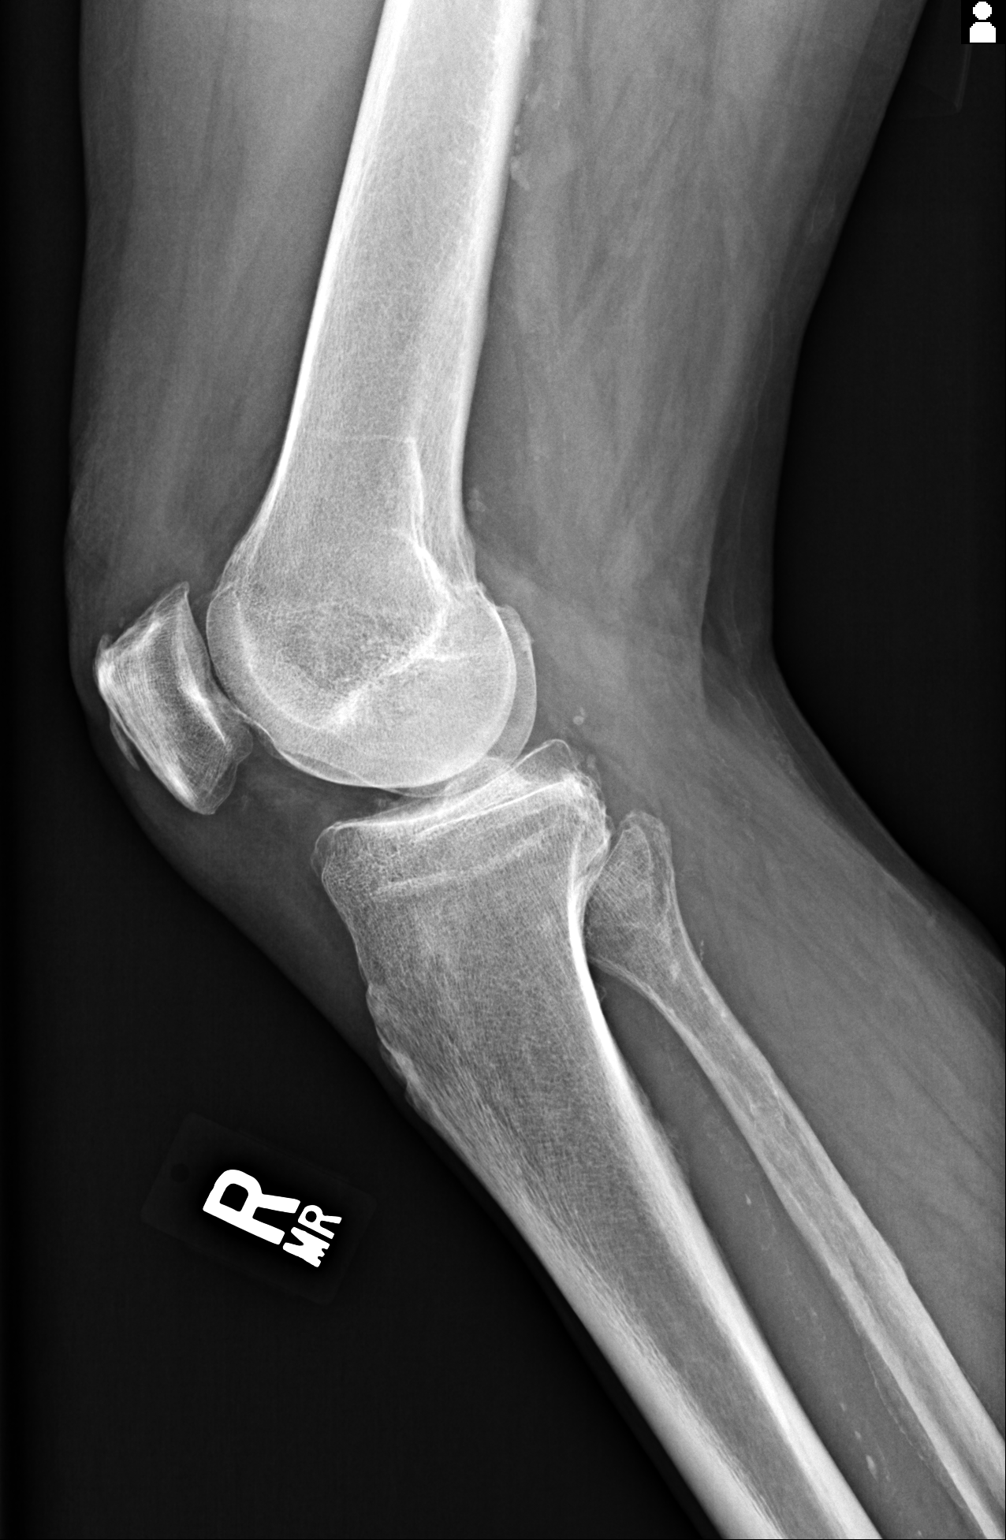
[im 3/4]
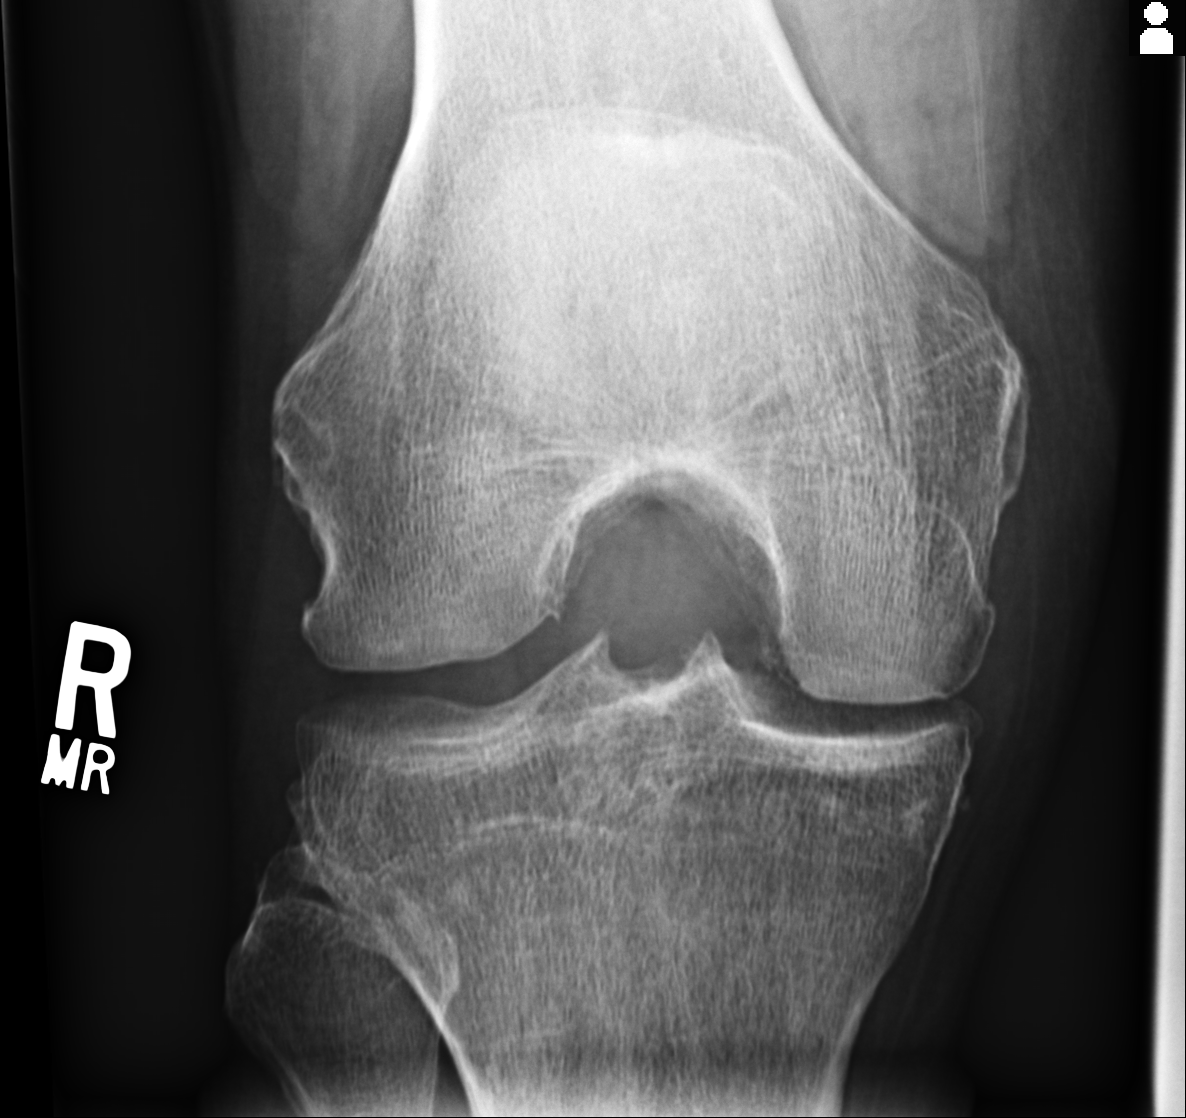
[im 4/4]
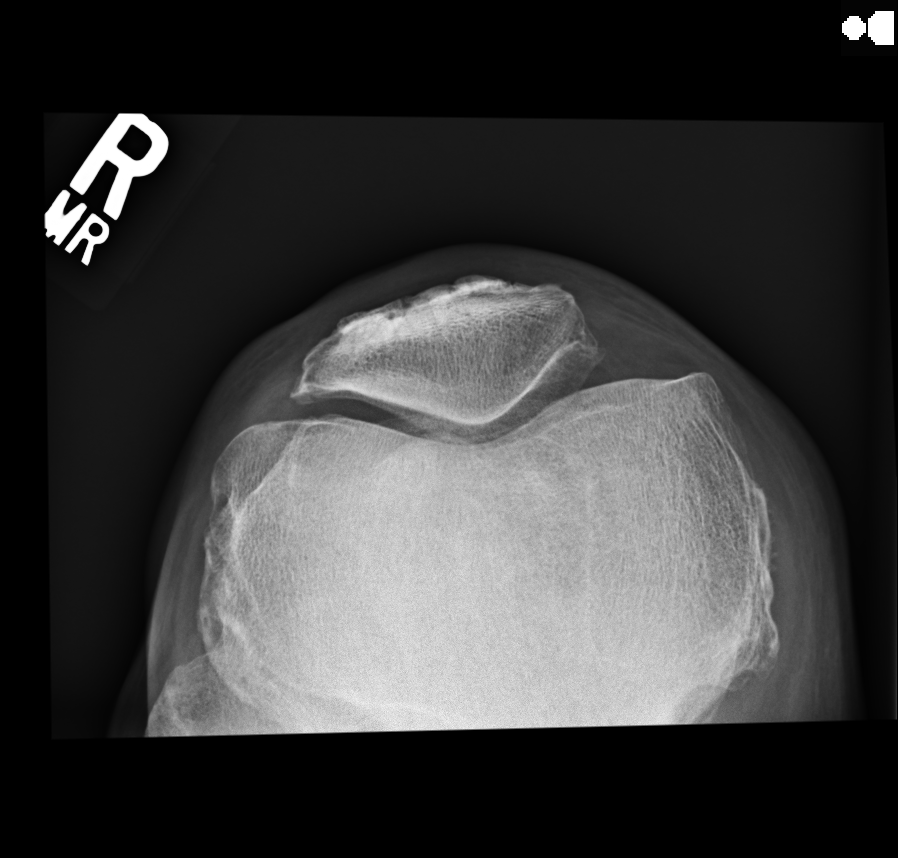

[4 of 4 positions shown; findings below may reference images not displayed]

FINDINGS: Four views of the right knee demonstrate normal mineralization. No fractures. No destructive lesions. Moderate medial compartment osteoarthritis. Moderate patellofemoral compartment osteoarthritis. Lateral compartment appears adequately maintained. Small effusion. Tiny loose body posterior joint space. Scattered vascular calcifications.
IMPRESSION: Osteoarthritis and associated joint effusion. Tiny loose body posterior joint space. Atherosclerotic vascular disease.

## 2019-03-21 IMAGING — CR KNEE LT 4 VWS MIN
1 series · 4 of 4 positions shown · non-contrast
Comparison: None

HISTORY: Arthritis
TECHNIQUE: Left knee 4 views

[Series 1: lat · 0.17mm/px · 4 of 4 slices shown]
[im 1/4]
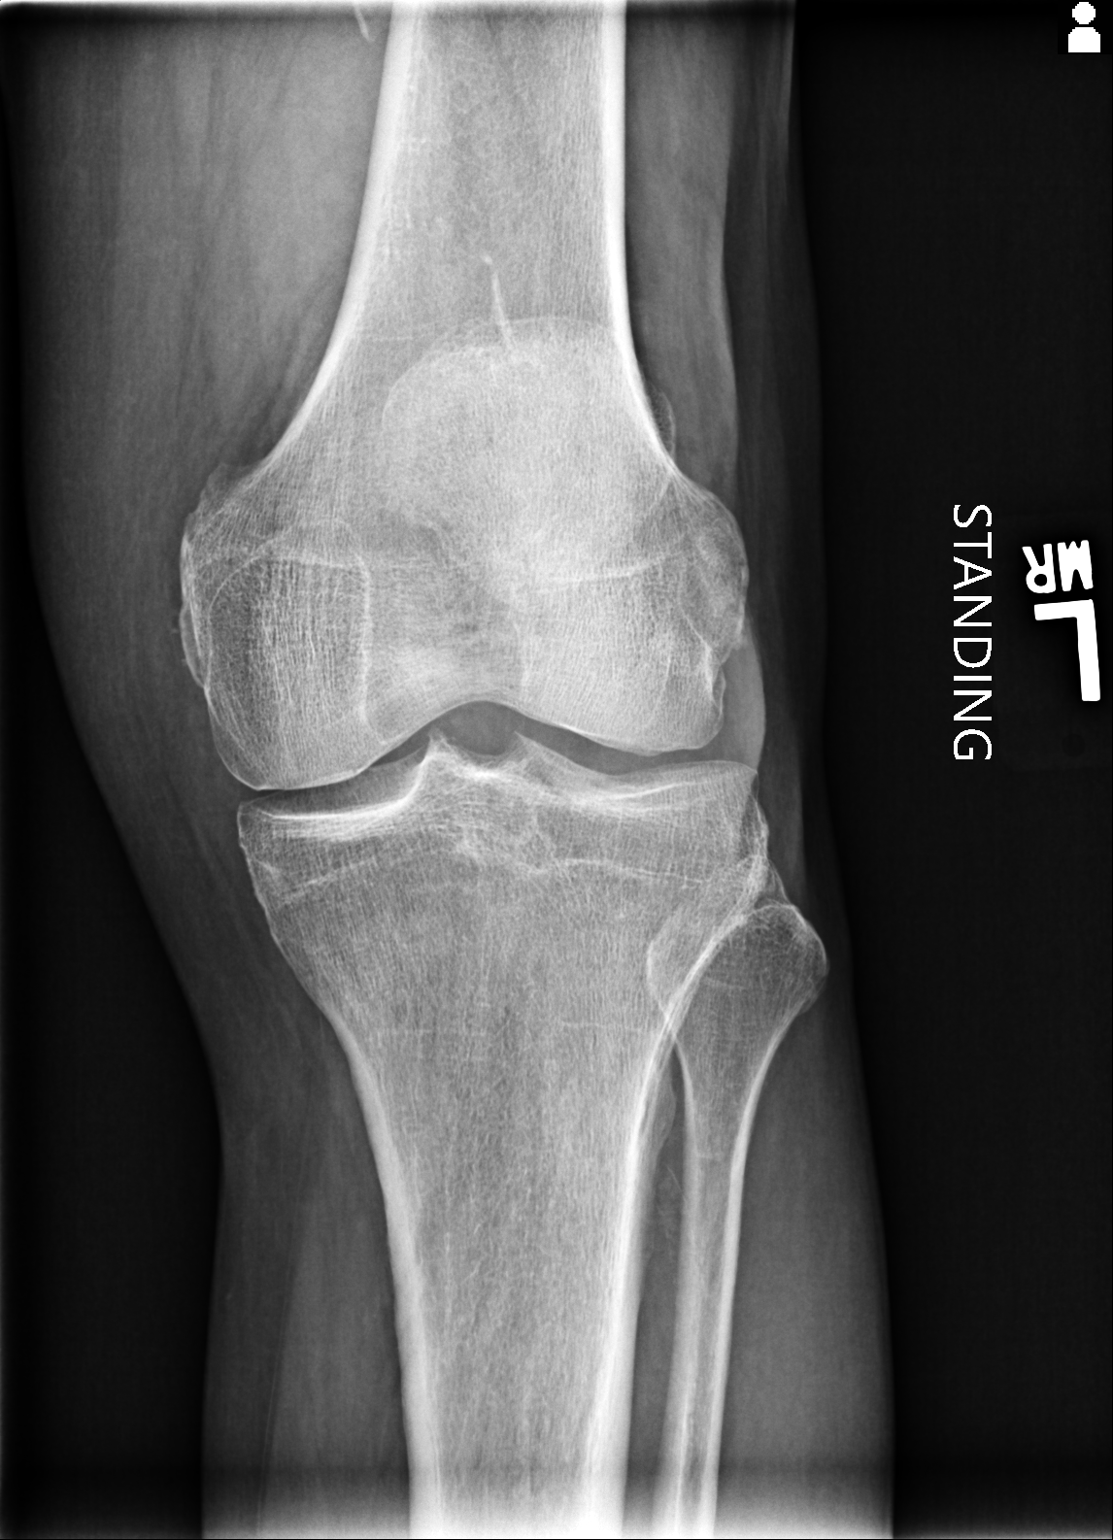
[im 2/4]
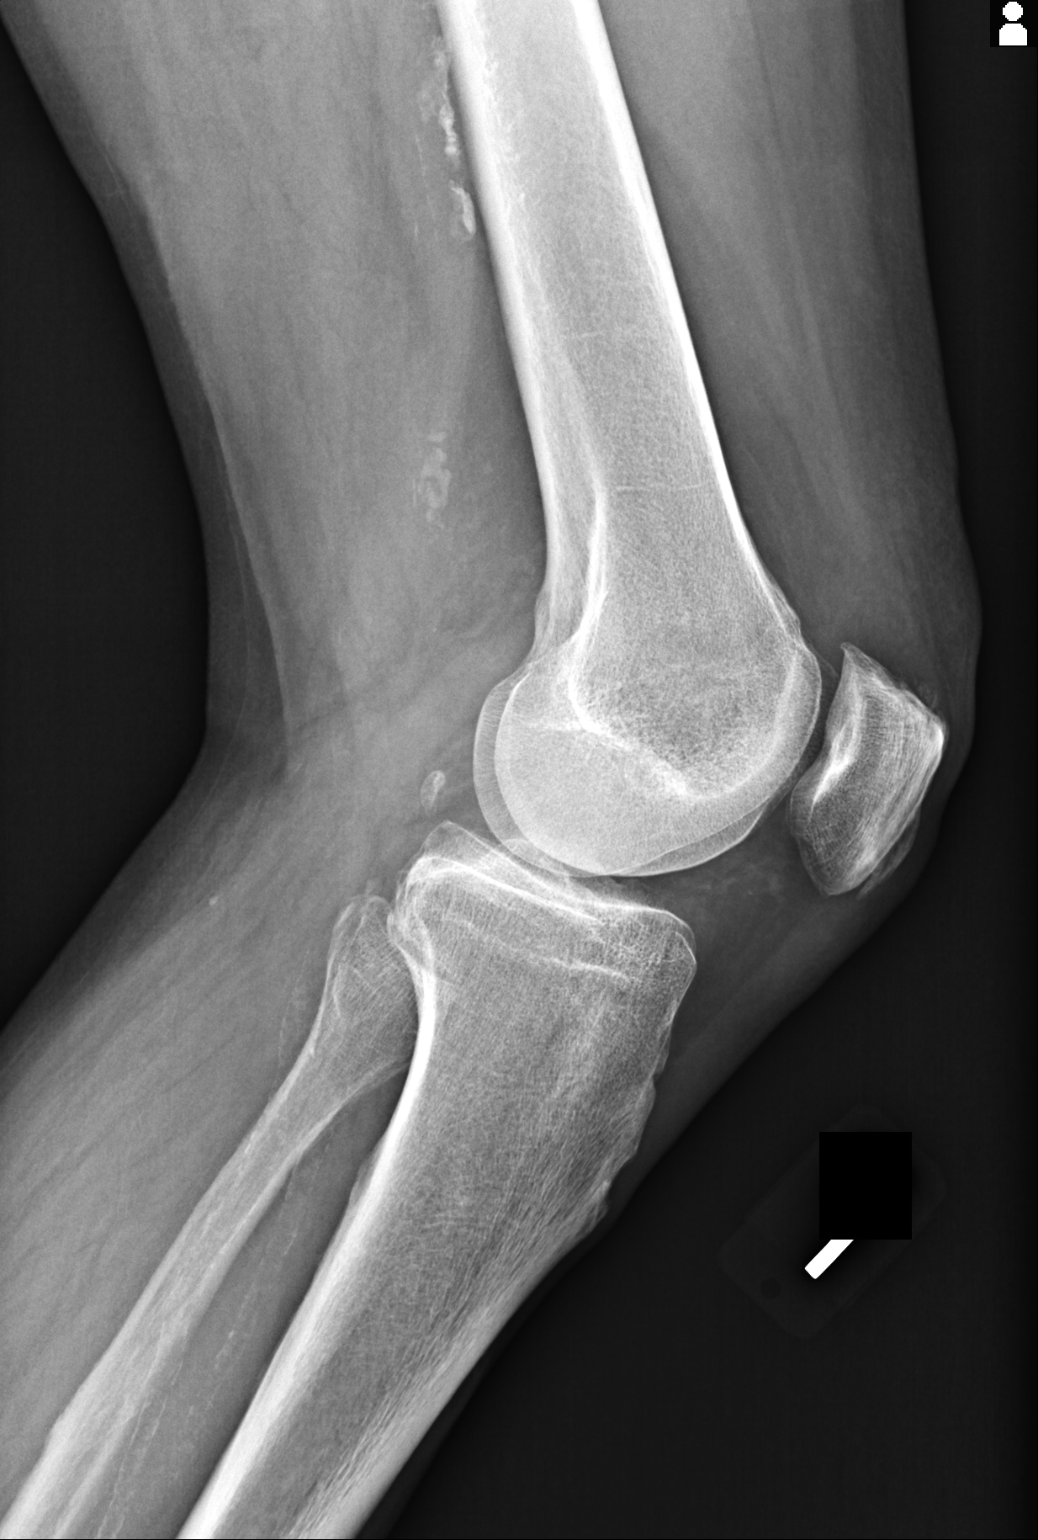
[im 3/4]
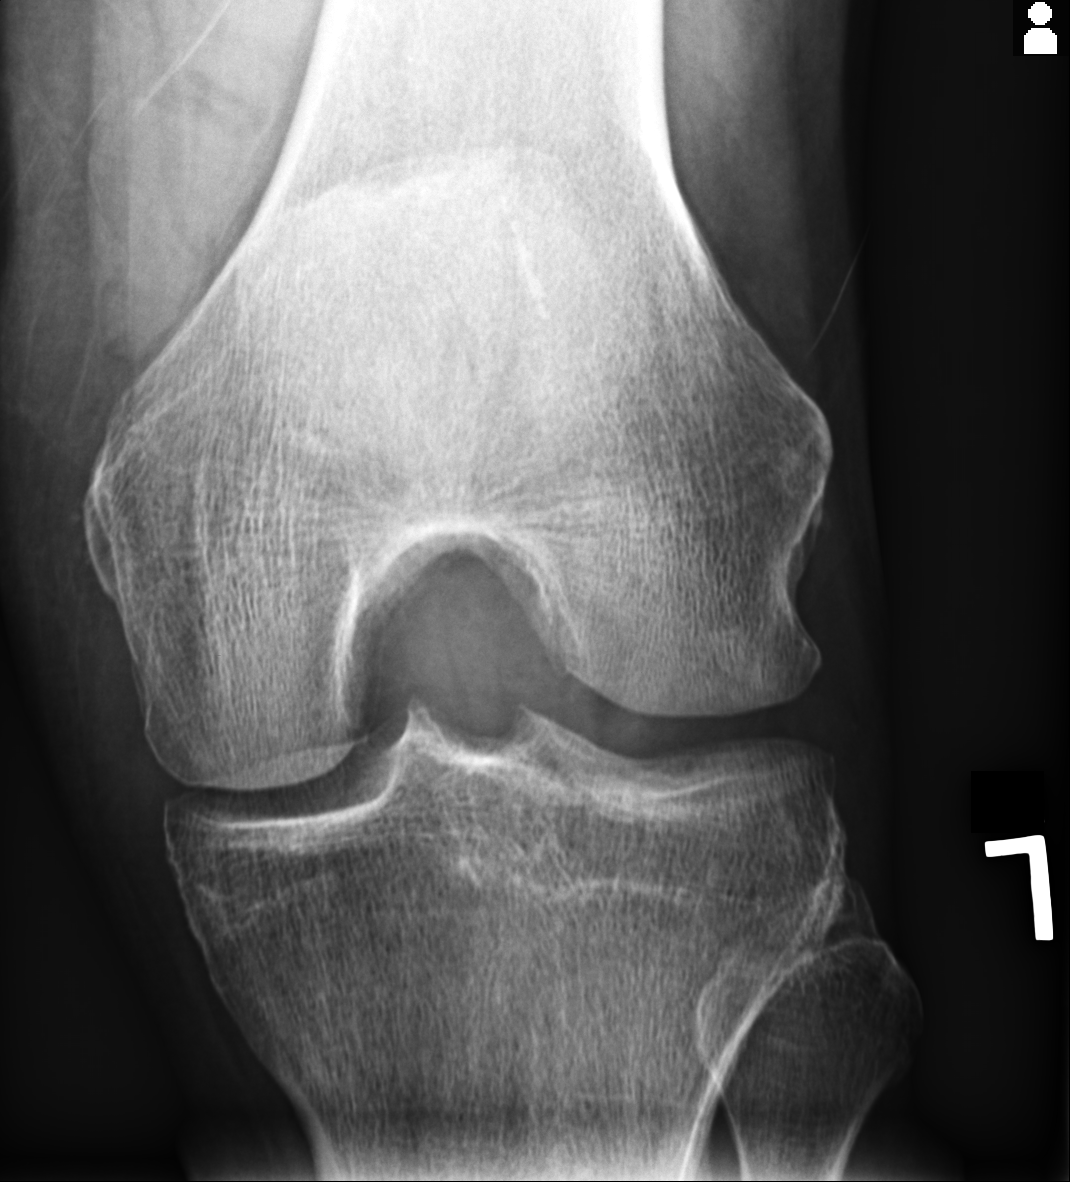
[im 4/4]
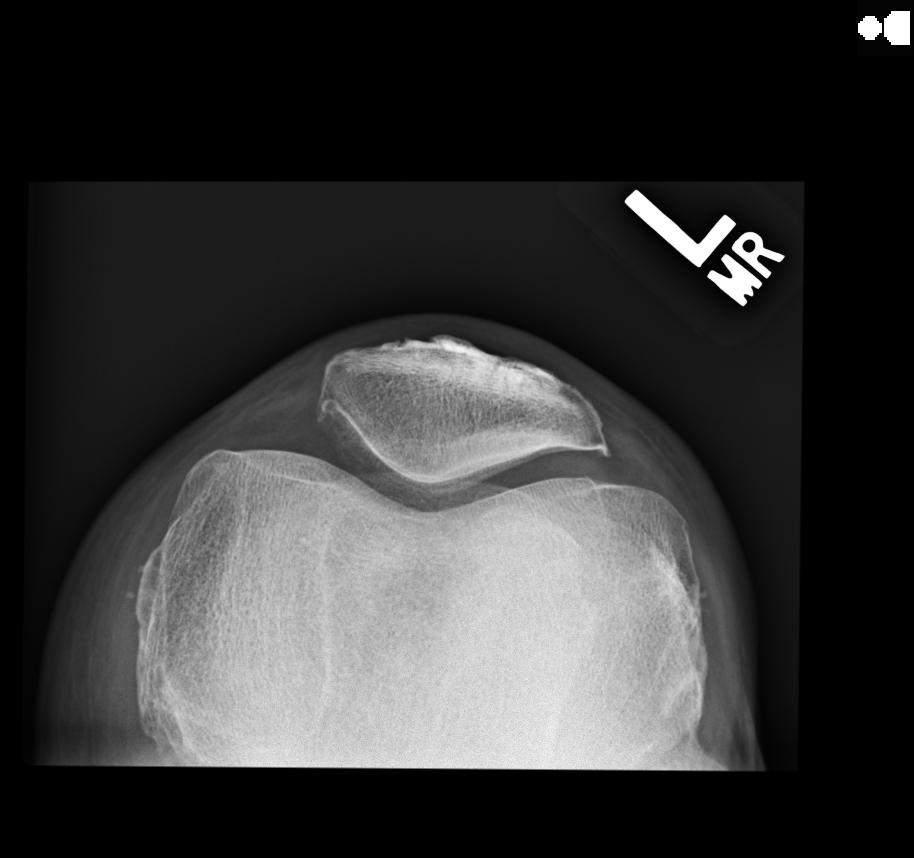

[4 of 4 positions shown; findings below may reference images not displayed]

FINDINGS: 4 views left knee demonstrate normal mineralization. No fractures. No destructive lesions. Moderate medial compartment and patellofemoral compartment osteoarthritis. Lateral compartment adequately maintained. Small effusion. Vascular calcifications of the popliteal artery and trifurcation vessels.
IMPRESSION: Medial compartment and patellofemoral compartment osteoarthritis with associated small effusion. Atherosclerotic vascular disease.

## 2019-04-25 ENCOUNTER — Ambulatory Visit: Admit: 2019-04-25 | Discharge: 2019-04-25 | Payer: Medicare (Managed Care) | Attending: Acute Care

## 2019-04-25 DIAGNOSIS — R063 Periodic breathing: Secondary | ICD-10-CM

## 2019-04-25 NOTE — Unmapped (Signed)
NAME: [name]  MRN: [id number]  Author:Odyn Turko [name]  Date:04/25/2019    Last seen in this clinic 04/23/18 by Richard Bering, CNP.  Last office note reviewed    CHIEF COMPLAINT:   79 y.o. 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 The patient provided verbal consent for an over the phone visit. Patient's name and birth date verified during the visit.    Referred by [name]    HISTORY PRESENT ILLNESS:    [name] loves his PAP device. He has used the ASV machine 30 out of the past 30 nights. His average nightly use is 7 hours and 54 minutes with an AHI of 1.  He is not having any problems with the full mask or the equipment. He is tolerating the pressure well. There is no evidence of any mask leaks. He denies problems with rainout, nasal congestion or snoring with PAP therapy. He has improved sleep quality and daytime energy with PAP therapy.   Bedtime: 12 AM  SLT: 15-20  minutes  Awakenings during the night: once to use the restroom  Wake up: 7 AM   Naps: Yes 30 minutes    Mood is good    He has no health changes from his previous visit.    No reports of sleep walking, restless legs, bruxism or other abnormal nocturnal behaviors.    PAP Data Download:  Equipment:  ResMed -  min EPAP 6, max EPAP 15, min pressure support 4, Max pressure support 15 cwp   Mask: Full  No chinstrap  Equipment download:   Data date range:03/24/19 to 04/22/19   [facility] data shows that over 30 nights the patient has used the device 30 nights.   The average use on nights used =  7 hours 54 minutes.  The percentage of nights used more than 4 hours =  100%.  The machine reported AHI is 1.  95th percentile leak was 32.6L/minute. IPAP Pressure: Median: 14.1, 95th percentile:18.8 and Maximum:21.8. EPAP Pressure: Median: 7.6, 95th percentile:10.4 and Maximum:11.9.   DME: Medical Services     Prior  Diagnostic Testing:    CPAP/BPAP Location: [facility]  Date of CPAP/BPAP (78295): 03/09/17  CmH2O Pressure prescribed: 7/7/0/15/25  AHI at Prescribed:4.6  Lowest SpO2% at Prescribed:83%  PLMI:     PLMAI:  Weight: 257 pounds  Other:    Date of CPAP: 12/06/2008   SE %: 76.5   AHI: 1.1   Lowest p ox %: 90.0   CPAP performed at: [facility] @ time of study: 265 lbs   DME Company: [employer]   Mask type: full face   cmH2O: 13     Date of NPSG: 05/27/2003   SE %: 63.5   AHI: 52   Lowest p ox %: 73   NPSG performed at: [facility] Readings from Last 3 Encounters:   04/23/18 (!) 248 lb 9.6 oz (112.8 kg)   03/09/18 (!) 240 lb 1.6 oz (108.9 kg)   01/24/18 (!) 241 lb (109.3 kg)       PAST MEDICAL HISTORY:  Surgical history:  Past Surgical History:   Procedure Laterality Date   ??? COLONOSCOPY W/ POLYPECTOMY      benign  has at 3 plus procedures   ??? HERNIA REPAIR  9/09    right inguinal,  mini maze   ??? HERNIA REPAIR  2/10    herniated lungs bilateral (repaired with mesh)   ??? mini-maze      corrected his A-fib   ??? rt eye  02/2018   ??? SEPTOPLASTY     ??? UVULOPALATOPHARYNGOPLASTY     ??? UVULOPALATOPHARYNGOPLASTY      has had esophageal dilations in the past (Dr.Decter)   ??? WISDOM TOOTH EXTRACTION         Medical history:  Past Medical History:   Diagnosis Date   ??? Atrial fibrillation (CMS Dx)     corrected with Mini-maze procedure   ??? Cataract     left eye   ??? Diabetes mellitus (CMS Dx)     type 2   ??? Dilated cardiomyopathy (CMS Dx)    ??? Hearing loss     left ear   ??? Hypercholesteremia    ??? Hypertension    ??? Sleep apnea    ??? Thyroid disease     hypothyroidism       Medications:  Current Outpatient Medications   Medication Sig   ??? apixaban Take 5 mg by mouth 2 times a day.   ??? aspirin Take 81 mg by mouth daily.   ??? atorvastatin Take 10 mg by mouth at bedtime.    ??? carvediloL Take 25 mg by mouth 2 (two) times daily.     ??? chorionic gonadotropin, human Inject 10,000 Units into the muscle once.   ??? glimepiride Take 1  tablet by mouth  every morning before  breakfast   ??? GLUC/CHON-MSM#1/C/MANG/BOS/BOR (OSTEO BI-FLEX ORAL) Take by mouth. OSTEO BI-FLEX JOINT SHIELD TABS    ??? glyBURIDE-metformin Take 2 tablets by mouth 2 times a day.    ??? levothyroxine Take 100 mcg by mouth every morning.    ??? losartan-hydrochlorothiazide Take 1 tablet by mouth daily.   ??? omeprazole Take 40 mg by mouth every morning.    ??? propafenone Take 225 mg by mouth 3 times a day.   ??? SAW PALMETTO XTR/ZINC PICOLIN (SAW PALMETTO EXTRACT ORAL) Take 2 tablets by mouth 2 times a day.    ??? tamsulosin Take 0.4 mg by mouth at bedtime.     No current facility-administered medications for this visit.          Allergies:Patient has no known allergies.    Family history:  Family History   Problem Relation Age of Onset   ??? Diabetes Mother         type 2   ??? Hypertension Mother    ??? Coronary artery disease Sister    ??? Diabetes Sister         type 2   ??? Coronary artery disease Sister    ??? Coronary artery disease Brother    ??? Hypertension Brother    ??? Diabetes Brother         type 2   ??? Hypertension Brother        Social history:  Social History     Socioeconomic History   ??? Marital status: Married     Spouse name: None   ??? Number of children: None   ??? Years of education: None   ??? Highest education level: None   Occupational History   ??? None   Social Needs   ??? Financial resource strain: None   ??? Food insecurity     Worry: None     Inability: None   ??? Transportation needs     Medical:  None     Non-medical: None   Tobacco Use   ??? Smoking status: Never Smoker   ??? Smokeless tobacco: Never Used   ??? Tobacco comment: 03/19/07   Substance and Sexual Activity   ??? Alcohol use: No   ??? Drug use: No     Comment: 04-14-11   ??? Sexual activity: None   Lifestyle   ??? Physical activity     Days per week: None     Minutes per session: None   ??? Stress: None   Relationships   ??? Social Wellsite geologist on phone: None     Gets together: None     Attends religious service: None     Active member of  club or organization: None     Attends meetings of clubs or organizations: None     Relationship status: None   ??? Intimate partner violence     Fear of current or ex partner: None     Emotionally abused: None     Physically abused: None     Forced sexual activity: None   Other Topics Concern   ??? Caffeine Use No   ??? Occupational Exposure Yes     Comment: retired Energy manager   ??? Exercise Yes   ??? Seat Belt Yes   Social History Narrative   ??? None       Review of Systems    Constitutional: Negative.  Negative for chills, fatigue and fever.   HENT: Positive for dry mouth. Negative for Neck pain, ringing in ears, congestion and sore throat.    Eyes: Negative.  Negative for itching and sensitivity to light.   Cardiovascular: Negative.  Negative for chest pain, palpitations and leg swelling.   Psychiatric/Behavioral: Negative.  Negative for agitation, decreased concentration and depression. The patient is not anxious.    Gastrointestinal: Negative.  Negative for abdominal pain, heartburn, diarrhea and constipation.   Genitourinary: Negative.  Negative for Unable to control urine, frequency and urgency.   Musculoskeletal: Negative for arthralgias, back pain and myalgias.   Skin: Negative.  Negative for rash and wound.   Neurological: Negative.  Negative for dizziness, headaches, waking up with a headache and weakness.   Respiratory: Positive for shortness of breath. Negative for cough and wheezing.        I have reviewed the review of system and advised patient to contact primary care physician or specialist for non sleep related issues.    ROS  has been reviewed and updated with the patient during this visit.    PHYSICAL EXAMINATION:  There were no vitals taken for this visit.  This was a phone conversation in lieu of an in-person visit due to the COVID-19 emergency.  GENERAL:  Answering phone promptly. Speech clear, in NAD and good historian.  Resp: No audible respiratory distress or wheezing  NEURO:  Alert and  oriented   PSYCH:  Calm, pleasant and appropriate. Insight and judgment appear good      LABORATORY TESTS:    No results found for: IRON, TIBC  Lab Results   Component Value Date    TSH 1.50 04/15/2008    FREET4 1.6 04/15/2008     Lab Results   Component Value Date    WBC 14.4 (H) 03/09/2018    HGB 10.1 (L) 03/09/2018    HCT 31.0 (L) 03/09/2018    MCV 81.8 03/09/2018    PLT 174 03/09/2018     No components found for:  HBA1C  Lab Results   Component Value Date    GLUCOSE 164 (H) 03/08/2018    BUN 18 03/08/2018    CO2 27 03/08/2018    K 3.8 03/08/2018    NA 138 03/08/2018    CL 103 03/08/2018    CALCIUM 8.4 (L) 03/08/2018     No components found for: LASTABGRESULT     EPWORTH SLEEPINESS SCALE: (at or above 10 is abnormal and 24 is the maximum score)  Epworth Sleepiness Scale 04/14/2011 02/22/2016 02/21/2017 03/16/2017 06/19/2017 04/23/2018 04/25/2019   Sitting and reading 2 1 1  0 1 1 0   Watching TV 3 2 2 1 1 1  0   Sitting, inactive in a public place (e.g. a theatre or a meeting) 1 0 0 1 1 0 0   As a passenger in a car for an hour without a break 2 0 1 0 0 0 0   Lying down to rest in the afternoon when circumstances permit 3 2 2 1 1 1 3    Sitting and talking to someone 0 0 0 0 0 0 0   Sitting quietly after a lunch without alcohol 1 1 0 0 0 0 0   In a car, while stopped for a few minutes in traffic 0 0 0 0 0 0 0   Total score 12 6 6 3 4 3 3        Assessment      1. 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  Last office note reviewed.    Annual follow-up.  Compliance download showing excellent control of apneic events using ASV therapy.  AHI well within normal range.  No complaints of pressure or mask discomfort.  Air leaks present.    History of diabetes mellitus, hearing loss, hypertension, atrial fibrillation, cataracts, dilated cardiomyopathy, hypercholesterolemia and thyroid disease.    Plan:  The findings of the downloaded data were reviewed with the patient.     A discussion was  given regarding the benefits of using the humidifier.  He was encouraged to increase current settings.    Reviewed importance of using ASV at all times when sleeping such as when travelling and taking naps.    Equipment and mask care encouraged.     PAP equipment renewal order written. Encouraged to contact the equipment supplier for parts and service as needed.    He will continue nightly ASV therapy.    Follow-up appointment:Return visit in 12 months.         A copy of my Plan and Assessment will be sent to: [name]    Medical Decision Making:  The following items were considered in medical decision making:  Review / order other diagnostic tests/interventions    Electronically signed by Garret Reddish Channa Hazelett 04/26/2019 4:38 PM    There is no height or weight on file to calculate BMI.  BP Readings from Last 3 Encounters:   04/23/18 140/86   03/09/18 143/72   01/24/18 146/78       SLEEP QUALITY MEASURES:   Obstructive Sleep Apnea Follow Up Visit:   Adherence to therapy documented at least annually?: Yes    Reassessment of excessive daytime sleepiness?: Yes          *This note was transcribed with computerized voice recognition technology.  This note has been reviewed for accuracy however inadvertent transcription errors may be present.  This was a Phone conversation, in lieu of an in-person visit. The patient provided verbal consent to participate in the telehealth visit.   I spent 17 minutes speaking with the patient, conducting an interview, performing a limited exam, and educating the patient on my assessment and plan. I also spent 7 minutes, on the same day as the encounter, preparing to see the patient (eg, review of tests) and documenting clinical information in the electronic or other health record.

## 2019-04-30 NOTE — Unmapped (Signed)
Message sent to Medical Service informing them of PAP renewal orders available in chart.

## 2019-05-07 ENCOUNTER — Ambulatory Visit: Admit: 2019-05-07 | Discharge: 2019-05-07 | Payer: MEDICARE | Attending: Internal Medicine | Primary: Internal Medicine

## 2019-05-07 ENCOUNTER — Encounter

## 2019-05-07 DIAGNOSIS — E119 Type 2 diabetes mellitus without complications: Secondary | ICD-10-CM

## 2019-05-07 LAB — POCT GLYCOSYLATED HEMOGLOBIN (HGB A1C): Hemoglobin A1C: 6.6 %

## 2019-05-07 NOTE — Assessment & Plan Note (Signed)
This is a chronic problem. The problem is well controlled.  Patient monitors readings regularly. Pertinent negatives include no chest pain, focal sensory loss, focal weakness, leg pain, myalgias or shortness of breath. No headaches or chest pain. Takes medications regularly.  Blood pressure has been stable, blood work was reviewed, and advised patient to continue the current instructions or medications.

## 2019-05-07 NOTE — Assessment & Plan Note (Signed)
Diabetes Mellitus Type II:  Home blood sugar records reviewed: fasting range: 120-130.  No significant episodes of hypoglycemia. Compliant with medications.  No polyuria, polydipsia, visual changes, foot problems, GI upset.  Diabetic diet compliance:  compliant most of the time.  Current exercise: none. Will check labs, and refill medications as appropriate.    Hypertension:  Denies CP, SOB, visual changes, dizziness, palpitations or HA.  He is adherent to a low sodium diet.  Blood pressure typically runs ok  outside of the office. Continue current medications.    Hyperlipidemia:  No new myalgias or GI upset on current medications.       Lab Results   Component Value Date    LABA1C 6.6 05/07/2019    LABA1C 6.6 09/10/2018    LABA1C 6.6 09/10/2018     Lab Results   Component Value Date    LABMICR 3.10 (H) 03/26/2018    CREATININE 1.0 09/10/2018     Lab Results   Component Value Date    ALT 10 09/10/2018    AST 12 (L) 09/10/2018     No components found for: CHLPL  Lab Results   Component Value Date    TRIG 91 09/10/2018     Lab Results   Component Value Date    HDL 29 (L) 09/10/2018     Lab Results   Component Value Date    LDLCALC 63 09/10/2018         BP Readings from Last 2 Encounters:   05/07/19 124/80   09/10/18 120/72     Hemoglobin A1C (%)   Date Value   05/07/2019 6.6     Microscopic Examination (no units)   Date Value   09/22/2014 Not Indicated     Microalbumin, Random Urine (mg/dL)   Date Value   46/56/8127 3.10 (H)     LDL Calculated (mg/dL)   Date Value   51/70/0174 63              Tobacco use:  Patient  reports that he has never smoked. He has never used smokeless tobacco.    If Smoker - Cessation materials given? NA    Is Daily aspirin on medication list?:  Yes    Diabetic retinal exam done? Yes   If yes, document in Health Maintenance.     Monofilament placed on counter? Yes    Shoes and socks removed? Yes    BP taken with correct size cuff? Yes   Repeated if > 130/80 Yes     Microalbumin performed if  applicable?  Yes

## 2019-05-07 NOTE — Assessment & Plan Note (Signed)
Some sob,   Seeing cardiology,  On coreg,  Will follow.

## 2019-05-07 NOTE — Assessment & Plan Note (Signed)
Patient counseled,   Encouraged to lose weight, watch diet and exercise consistently.

## 2019-05-07 NOTE — Assessment & Plan Note (Signed)
This has been a long standing problem, takes lipitor      Monitors diet and tries to follow a low fat diet. Has  been reasonably  compliant w exercise. Lipids have been stable, The problem is controlled. Recent lipid tests were reviewed and are normal. Pertinent negatives include no chest pain, focal sensory loss, focal weakness, leg pain, myalgias or shortness of breath.  Advised patient to continue the current instructions or medications.

## 2019-05-07 NOTE — Assessment & Plan Note (Signed)
On synthroid,  Patient is compliant w medications, no side effects, effective, provides adequate symptom relief. No new symptoms or problems as noted by patient.  The problem is stable, no changes noted by patient. Will consider monitoring labs and refill medications as appropriate. Patient counseled and will continue current plan.

## 2019-05-07 NOTE — Assessment & Plan Note (Signed)
On eliquis and coreg,  Rate controlled,  Patient is compliant w medications, no side effects, effective, provides adequate symptom relief. No new symptoms or problems as noted by patient.  The problem is stable, no changes noted by patient. Will consider monitoring labs and refill medications as appropriate. Patient counseled and will continue current plan.

## 2019-05-07 NOTE — Progress Notes (Signed)
Subjective:      Patient ID: Carl Blair is a 79 y.o. male.    HPI  Established patient here for a visit to manage acute and chronic medical conditions as detailed below.    Chronic systolic congestive heart failure (HCC)  Some sob,   Seeing cardiology,  On coreg,  Will follow.       Morbid obesity due to excess calories [facility])  Patient counseled,   Encouraged to lose weight, watch diet and exercise consistently.      Type 2 diabetes mellitus without complication, without long-term current use of insulin (HCC)  Diabetes Mellitus Type II:  Home blood sugar records reviewed: fasting range: 120-130.  No significant episodes of hypoglycemia. Compliant with medications.  No polyuria, polydipsia, visual changes, foot problems, GI upset.  Diabetic diet compliance:  compliant most of the time.  Current exercise: none. Will check labs, and refill medications as appropriate.    Hypertension:  Denies CP, SOB, visual changes, dizziness, palpitations or HA.  He is adherent to a low sodium diet.  Blood pressure typically runs ok  outside of the office. Continue current medications.    Hyperlipidemia:  No new myalgias or GI upset on current medications.       Lab Results   Component Value Date    LABA1C 6.6 05/07/2019    LABA1C 6.6 09/10/2018    LABA1C 6.6 09/10/2018     Lab Results   Component Value Date    LABMICR 3.10 (H) 03/26/2018    CREATININE 1.0 09/10/2018     Lab Results   Component Value Date    ALT 10 09/10/2018    AST 12 (L) 09/10/2018     No components found for: CHLPL  Lab Results   Component Value Date    TRIG 91 09/10/2018     Lab Results   Component Value Date    HDL 29 (L) 09/10/2018     Lab Results   Component Value Date    LDLCALC 63 09/10/2018         BP Readings from Last 2 Encounters:   05/07/19 124/80   09/10/18 120/72     Hemoglobin A1C (%)   Date Value   05/07/2019 6.6     Microscopic Examination (no units)   Date Value   09/22/2014 Not Indicated     Microalbumin, Random Urine (mg/dL)   Date Value    13/24/4010 3.10 (H)     LDL Calculated (mg/dL)   Date Value   27/25/3664 63              Tobacco use:  Patient  reports that he has never smoked. He has never used smokeless tobacco.    If Smoker - Cessation materials given? NA    Is Daily aspirin on medication list?:  Yes    Diabetic retinal exam done? Yes   If yes, document in Health Maintenance.     Monofilament placed on counter? Yes    Shoes and socks removed? Yes    BP taken with correct size cuff? Yes   Repeated if > 130/80 Yes     Microalbumin performed if applicable?  Yes      Acquired hypothyroidism  On synthroid,  Patient is compliant w medications, no side effects, effective, provides adequate symptom relief. No new symptoms or problems as noted by patient.  The problem is stable, no changes noted by patient. Will consider monitoring labs and refill medications as appropriate. Patient counseled and  will continue current plan.     Chronic a-fib (HCC)  On eliquis and coreg,  Rate controlled,  Patient is compliant w medications, no side effects, effective, provides adequate symptom relief. No new symptoms or problems as noted by patient.  The problem is stable, no changes noted by patient. Will consider monitoring labs and refill medications as appropriate. Patient counseled and will continue current plan.     Pure hypercholesterolemia  This has been a long standing problem, takes lipitor      Monitors diet and tries to follow a low fat diet. Has  been reasonably  compliant w exercise. Lipids have been stable, The problem is controlled. Recent lipid tests were reviewed and are normal. Pertinent negatives include no chest pain, focal sensory loss, focal weakness, leg pain, myalgias or shortness of breath.  Advised patient to continue the current instructions or medications.      Essential hypertension  This is a chronic problem. The problem is well controlled.  Patient monitors readings regularly. Pertinent negatives include no chest pain, focal sensory  loss, focal weakness, leg pain, myalgias or shortness of breath. No headaches or chest pain. Takes medications regularly.  Blood pressure has been stable, blood work was reviewed, and advised patient to continue the current instructions or medications.       Review of Systems  ROS: No unusual headaches or allergy symptoms or blurred vision. No prolonged cough. No flushing or facial pain or chest pain,dizziness, dyspnea, palpitations, or chest pain on exertion. No syncope. No nausea or vommitting or diarrhea.  No jaundice or abdominal pain, change in bowel habits, black or bloody stools.  No dysuria or hematuria or frequency of urination.   No myalgias or muscle pain.  No numbness, weakness, or tingling. No falls, or loss of consciousness. No weight loss or back pain. No falls.  No paresthesias. No joint swelling or redness. No joint pain. No recent weight loss. No focal weakness or sensory deficits or paresthesias, No confusion or altered sensorium. No hematemesis. No hearing loss. No siezures. All other systems were reviewed, and review was negative.   Objective:   Physical Exam  BP 124/80 (Site: Left Upper Arm, Position: Sitting, Cuff Size: Medium Adult)    Pulse 80    Temp 95.1 ??F (35.1 ??C) (Skin)    Resp 14    Wt 249 lb (112.9 kg)    SpO2 95%    BMI 33.77 kg/m??    The physical exam reveals a patient who appears well, alert and oriented x 3, pleasant, cooperative. Vitals are as noted. Head is atraumatic and normocephalic. Eyes reveal normal conjunctiva, cornea normal, pupils are equal and rective to light. Nasal mucosa is normal. Throat is normal without exudates. Ears reveal normal tympanic membranes.Neck is supple and free of adenopathy, or masses. No thyromegaly. No jugular venous distension. Lungs are clear to auscultation, no rales or rhonchi noted. Heart sounds are regular , no murmurs, clicks, gallops or rubs. Abdomen is soft, no tenderness, masses or organomegaly. Bowel sounds are normally heard.Pelvis:  normal. Extremities are normal. Peripheral pulses are normal. Screening neurological exam is normal without focal findings. Cranial nerves are intact, reflexes are symmetrical and muscle strength eaqual. Skin is normal without suspicious lesions noted.     Assessment:      Chronic systolic congestive heart failure ([place])  Some sob,   Seeing cardiology,  On coreg,  Will follow.       Morbid obesity due to excess calories ([place])  Patient counseled,   Encouraged to lose weight, watch diet and exercise consistently.      Type 2 diabetes mellitus without complication, without long-term current use of insulin (HCC)  Diabetes Mellitus Type II:  Home blood sugar records reviewed: fasting range: 120-130.  No significant episodes of hypoglycemia. Compliant with medications.  No polyuria, polydipsia, visual changes, foot problems, GI upset.  Diabetic diet compliance:  compliant most of the time.  Current exercise: none. Will check labs, and refill medications as appropriate.    Hypertension:  Denies CP, SOB, visual changes, dizziness, palpitations or HA.  He is adherent to a low sodium diet.  Blood pressure typically runs ok  outside of the office. Continue current medications.    Hyperlipidemia:  No new myalgias or GI upset on current medications.       Lab Results   Component Value Date    LABA1C 6.6 05/07/2019    LABA1C 6.6 09/10/2018    LABA1C 6.6 09/10/2018     Lab Results   Component Value Date    LABMICR 3.10 (H) 03/26/2018    CREATININE 1.0 09/10/2018     Lab Results   Component Value Date    ALT 10 09/10/2018    AST 12 (L) 09/10/2018     No components found for: CHLPL  Lab Results   Component Value Date    TRIG 91 09/10/2018     Lab Results   Component Value Date    HDL 29 (L) 09/10/2018     Lab Results   Component Value Date    LDLCALC 63 09/10/2018         BP Readings from Last 2 Encounters:   05/07/19 124/80   09/10/18 120/72     Hemoglobin A1C (%)   Date Value   05/07/2019 6.6     Microscopic Examination (no units)    Date Value   09/22/2014 Not Indicated     Microalbumin, Random Urine (mg/dL)   Date Value   04/54/0981 3.10 (H)     LDL Calculated (mg/dL)   Date Value   19/14/7829 63              Tobacco use:  Patient  reports that he has never smoked. He has never used smokeless tobacco.    If Smoker - Cessation materials given? NA    Is Daily aspirin on medication list?:  Yes    Diabetic retinal exam done? Yes   If yes, document in Health Maintenance.     Monofilament placed on counter? Yes    Shoes and socks removed? Yes    BP taken with correct size cuff? Yes   Repeated if > 130/80 Yes     Microalbumin performed if applicable?  Yes      Acquired hypothyroidism  On synthroid,  Patient is compliant w medications, no side effects, effective, provides adequate symptom relief. No new symptoms or problems as noted by patient.  The problem is stable, no changes noted by patient. Will consider monitoring labs and refill medications as appropriate. Patient counseled and will continue current plan.     Chronic a-fib (HCC)  On eliquis and coreg,  Rate controlled,  Patient is compliant w medications, no side effects, effective, provides adequate symptom relief. No new symptoms or problems as noted by patient.  The problem is stable, no changes noted by patient. Will consider monitoring labs and refill medications as appropriate. Patient counseled and will continue current plan.     Pure  hypercholesterolemia  This has been a long standing problem, takes lipitor      Monitors diet and tries to follow a low fat diet. Has  been reasonably  compliant w exercise. Lipids have been stable, The problem is controlled. Recent lipid tests were reviewed and are normal. Pertinent negatives include no chest pain, focal sensory loss, focal weakness, leg pain, myalgias or shortness of breath.  Advised patient to continue the current instructions or medications.      Essential hypertension  This is a chronic problem. The problem is well controlled.   Patient monitors readings regularly. Pertinent negatives include no chest pain, focal sensory loss, focal weakness, leg pain, myalgias or shortness of breath. No headaches or chest pain. Takes medications regularly.  Blood pressure has been stable, blood work was reviewed, and advised patient to continue the current instructions or medications.           Plan:      Labs ordered, reviewed.   Medications refilled.   All Health maintenance needs reviewed and the needful ordered.           Marcello Fennel, MD

## 2019-05-08 LAB — LIPID PANEL
Cholesterol, Total: 96 mg/dL (ref 0–199)
HDL: 30 mg/dL — ABNORMAL LOW (ref 40–60)
LDL Calculated: 51 mg/dL (ref <100)
VLDL Cholesterol Calculated: 15 mg/dL

## 2019-05-08 LAB — CBC WITH AUTO DIFFERENTIAL
Basophils %: 0.6 %
Basophils Absolute: 0 K/uL (ref 0.0–0.2)
Eosinophils Absolute: 0.3 K/uL (ref 0.0–0.6)
Hematocrit: 27.2 % — ABNORMAL LOW (ref 40.5–52.5)
Hemoglobin: 8.4 g/dL — ABNORMAL LOW (ref 13.5–17.5)
Lymphocytes %: 32.7 %
Lymphocytes Absolute: 2.9 10*3/uL (ref 1.0–5.1)
MCH: 20.3 pg — ABNORMAL LOW (ref 26.0–34.0)
MCV: 65.9 fL — ABNORMAL LOW (ref 80.0–100.0)
MPV: 7 fL (ref 5.0–10.5)
Monocytes %: 8.6 %
Neutrophils %: 54.3 %
Neutrophils Absolute: 4.8 K/uL (ref 1.7–7.7)
Platelets: 250 10*3/uL (ref 135–450)
RBC: 4.12 M/uL — ABNORMAL LOW (ref 4.20–5.90)
RDW: 19.8 % — ABNORMAL HIGH (ref 12.4–15.4)
WBC: 8.7 K/uL (ref 4.0–11.0)

## 2019-05-08 LAB — COMPREHENSIVE METABOLIC PANEL
ALT: 11 U/L (ref 10–40)
AST: 14 U/L — ABNORMAL LOW (ref 15–37)
Albumin/Globulin Ratio: 2 (ref 1.1–2.2)
Albumin: 4.3 g/dL (ref 3.4–5.0)
Alkaline Phosphatase: 86 U/L (ref 40–129)
Anion Gap: 12 (ref 3–16)
CO2: 25 mmol/L (ref 21–32)
Calcium: 9.1 mg/dL (ref 8.3–10.6)
Chloride: 100 mmol/L (ref 99–110)
Creatinine: 1 mg/dL (ref 0.8–1.3)
GFR African American: >60 (ref >60)
GFR Non-African American: >60 (ref >60)
Globulin: 2.2 g/dL
Total Bilirubin: 0.3 mg/dL (ref 0.0–1.0)

## 2019-05-08 LAB — TSH: TSH: 2.42 u[IU]/mL (ref 0.27–4.20)

## 2019-05-08 LAB — BLOOD SMEAR REVIEW

## 2019-05-08 LAB — MICROALBUMIN / CREATININE URINE RATIO
Creatinine, Ur: 173.4 mg/dL (ref 39.0–259.0)
Microalbumin Creatinine Ratio: 15 mg/g (ref 0.0–30.0)

## 2019-05-23 ENCOUNTER — Encounter

## 2019-05-23 NOTE — Telephone Encounter (Signed)
Done!

## 2019-05-24 ENCOUNTER — Other Ambulatory Visit: Admit: 2019-05-24 | Payer: Medicare (Managed Care)

## 2019-05-24 DIAGNOSIS — D649 Anemia, unspecified: Secondary | ICD-10-CM

## 2019-05-24 LAB — DIFFERENTIAL
Basophils Absolute: 57 /uL (ref 0–200)
Basophils Relative: 0.7 % (ref 0.0–1.0)
Eosinophils Absolute: 336 /uL (ref 15–500)
Eosinophils Relative: 4.1 % (ref 0.0–8.0)
Lymphocytes Absolute: 2476 /uL (ref 850–3900)
Lymphocytes Relative: 30.2 % (ref 15.0–45.0)
Monocytes Absolute: 697 /uL (ref 200–950)
Monocytes Relative: 8.5 % (ref 0.0–12.0)
Neutrophils Absolute: 4633 /uL (ref 1500–7800)
Neutrophils Relative: 56.5 % (ref 40.0–80.0)
nRBC: 0 /100 WBC (ref 0–0)

## 2019-05-24 LAB — FOLATE: Folic Acid: 8.4 ng/mL (ref 5.90–24.80)

## 2019-05-24 LAB — CBC
Hematocrit: 27.5 % (ref 38.5–50.0)
Hemoglobin: 8.3 g/dL (ref 13.2–17.1)
MCH: 19.8 pg (ref 27.0–33.0)
MCHC: 30.3 g/dL (ref 32.0–36.0)
MCV: 65.2 fL (ref 80.0–100.0)
MPV: 6.7 fL (ref 7.5–11.5)
Platelets: 249 10*3/uL (ref 140–400)
RBC: 4.22 10*6/uL (ref 4.20–5.80)
RDW: 20.1 % (ref 11.0–15.0)
WBC: 8.2 10*3/uL (ref 3.8–10.8)

## 2019-05-24 LAB — IRON STUDIES
% Iron Saturation: 4.1 % (ref 15.0–55.0)
Iron: 16 ug/dL (ref 50–212)
TIBC: 390 ug/dL (ref 261–462)

## 2019-05-24 LAB — VITAMIN B12: Vitamin B-12: 137 pg/mL (ref 180–914)

## 2019-06-03 NOTE — Progress Notes (Signed)
Patient ___ reached   _X____not reached-preop instructions left on voice mail__[phone]___________      DATE__3/2/21______ TIME__1000______ARRIVAL__0830  [facility]_______      Nothing to eat or drink after midnight the night before,except for what the prep instructions call for.If you do not have the instructions or do not understand them please contact your doctors office.    Follow any instructions your doctors office has given you including what medications to take the AM of your procedure and which ones to hold.You may use your inhalers.If you take a long acting insulin the eve prior please cut the dose in half and take no diabetic medications that AM.Follow specific doctors office instructions regarding blood thinners and if they want you to hold and for how long. If you are on a blood thinner and have no instructions please contact the office and ask-CHECK ELIQUIS    Dress comfortably,bring your insurance card,picture ID,and a complete list of medications, including supplements.    You must have a responsible adult to stay with you during the procedure,drive you home and stay with you.    [facility] phone number [phone] for any questions.      OTHER INTRUCTIONS(if applicable)_________________________________________________________      COVID TEST         _____ Done ___ where ____       __X___ Scheduled 2/25/21___ where [facility]____       _____Other_________________      [facility] POLICY(subject to change)    There is a one visitor policy at [facility] for all surgeries and endoscopies.Whether the visitor can stay or will be asked to wait in the car will depend on the current policy and if social distancing can be maintained.The policy is subject to change at any time.Please make sure the visitor has a cell phone that is on,charged and able to accept calls, as this may be the way that the staff communicates with them.Pain management is NO VISITOR policyThe patients ride is expected to remain in the car with a cell phone for  communication.If the ride is leaving the hospital grounds please make sure they are back in time for pickup. Have the patient inform the staff on arrival what their rides plans are while the patient is in the facility.At the MAIN there is one visitor allowed.Please note that the visitor policy is subject to change.

## 2019-06-06 ENCOUNTER — Ambulatory Visit: Admit: 2019-06-06 | Discharge: 2019-06-06 | Payer: MEDICARE | Attending: Family | Primary: Internal Medicine

## 2019-06-06 DIAGNOSIS — Z20822 Contact with and (suspected) exposure to covid-19: Secondary | ICD-10-CM

## 2019-06-06 NOTE — Progress Notes (Signed)
Carl Blair received a viral test for COVID-19. They were educated on isolation and quarantine as appropriate. For any symptoms, they were directed to seek care from their PCP, given contact information to establish with a doctor, directed to an urgent care or the emergency room.

## 2019-06-07 LAB — COVID-19: SARS-CoV-2: NOT DETECTED

## 2019-06-11 ENCOUNTER — Inpatient Hospital Stay: Payer: MEDICARE

## 2019-06-11 ENCOUNTER — Ambulatory Visit: Admit: 2019-06-11 | Primary: Internal Medicine

## 2019-06-11 LAB — POCT GLUCOSE
POC Glucose: 200 mg/dl — ABNORMAL HIGH (ref 70–99)
POC Glucose: 166 mg/dl — ABNORMAL HIGH (ref 70–99)

## 2019-06-11 MED ORDER — SODIUM CHLORIDE 0.9 % IV SOLN
0.9 % | INTRAVENOUS | Status: DC
Start: 2019-06-11 — End: 2019-06-11
  Administered 2019-06-11 (×2): via INTRAVENOUS

## 2019-06-11 MED ORDER — LIDOCAINE HCL 2 % IJ SOLN
2 % | INTRAMUSCULAR | Status: DC | PRN
Start: 2019-06-11 — End: 2019-06-11
  Administered 2019-06-11: 14:00:00 100 via INTRAVENOUS

## 2019-06-11 MED ORDER — PROPOFOL 200 MG/20ML IV EMUL
200 MG/20ML | INTRAVENOUS | Status: DC | PRN
Start: 2019-06-11 — End: 2019-06-11
  Administered 2019-06-11 (×3): 50 via INTRAVENOUS
  Administered 2019-06-11: 14:00:00 100 via INTRAVENOUS
  Administered 2019-06-11: 15:00:00 50 via INTRAVENOUS

## 2019-06-11 NOTE — Discharge Instructions (Signed)
EGD and COLONOSCOPY DISCHARGE INSTRUCTIONS      EGD DISCHARGE INSTRUCTIONS    . Use salt water gargle, lozenges, or Chloraseptic spray as needed for sore throat.    . If you have fever, chills, excessive bleeding, severe chest pain, or abdominal pain, or any other problems, contact your physician's office immediately at [phone].    . If you had an esophageal dilatation, and experience fever, chills, excessive bleeding, shortness of breath, chest or abdominal pain, or any other unusual symptom, call the office immediately at [phone].    Marland Kitchen Continue home medications as directed.    . Call physician's office in five to seven business days for biopsy results or further instructions.      . Call your physician's office at [phone] for a follow-up appointment as needed.    . See your physician's report for procedure details and recommendations.        COLONOSCOPY DISCHARGE INSTRUCTIONS     You may experience some lightheadedness for the next several hours.   Plan on quiet relaxation for the rest of today. Nap for four hours following procedure if possible.   A responsible adult needs to stay with you today.     Eat bland food and avoid anything greasy or spicy initially-progress to your normal diet gradually.   Diet restrictions as instructed.   You may resume home medications as instructed.     It is not unusual to experience some mild cramping or gas pains, and you may not have a bowel movement for several days.   If you have any of the following problems, notify your physician or return to the hospital emergency room : fever, chills, excessive bleeding, excessive vomiting, difficulty swallowing, uncontrolled pain, increased abdominal distention, shortness of breath or any other problems.   If you had a polyp removed, avoid strenuous activity for 48 hours.Avoid the use of aspirin or related compounds for one week, unless otherwise instructed  by your physician.  You may notice a small amount of blood in your next few bowel movements, but if a large amount passes, call your physician.     Call your doctor at [phone] if you have any concerns.     If you had any  biopsies or polyps, call the doctor's office in 5-7 business days for results.     Make a follow up appointment as needed.    See your physician's report for details about your procedure and recommendations.    Resume Eliquid on Thursday, June 13, 2019, per Dr. Nyra Capes.    Begin Pantoprazole 40 mg twice a day.        ANESTHESIA DISCHARGE INSTRUCTIONS     Wear your seatbelt home.   You are under the influence of drugs-do not drink alcohol, drive ,operate machinery,or make any important decisions or sign any legal documentsfor 24 hours. You may resume normal activities tomorrow.   A responsible adult needs to be with you for 24 hours.   You may experience lightheadedness,dizziness,or sleepiness following surgery.   Rest at home today- increase activity as tolerated. It is recommended to take a four hour nap after procedure.   Progress slowly to a regular diet unless your physician has instructed you otherwise. Avoid spicy and greasy food on first meal.  Drink plenty of water.   If nausea becomes a problem call your physician.   Call your doctor if concerns arise.

## 2019-06-17 MED ORDER — OMEPRAZOLE 40 MG PO CPDR
40 MG | ORAL_CAPSULE | ORAL | 3 refills | Status: DC
Start: 2019-06-17 — End: 2020-05-01

## 2019-06-17 MED ORDER — CARVEDILOL 25 MG PO TABS
25 MG | ORAL_TABLET | ORAL | 3 refills | Status: DC
Start: 2019-06-17 — End: 2020-05-01

## 2019-06-17 MED ORDER — LEVOTHYROXINE SODIUM 100 MCG PO TABS
100 MCG | ORAL_TABLET | ORAL | 3 refills | Status: DC
Start: 2019-06-17 — End: 2020-02-24

## 2019-06-17 MED ORDER — GLIMEPIRIDE 2 MG PO TABS
2 MG | ORAL_TABLET | ORAL | 3 refills | Status: DC
Start: 2019-06-17 — End: 2020-02-24

## 2019-06-17 MED ORDER — SITAGLIPTIN PHOSPHATE 100 MG PO TABS
100 MG | ORAL_TABLET | ORAL | 3 refills | Status: DC
Start: 2019-06-17 — End: 2020-02-24

## 2019-06-17 MED ORDER — METFORMIN HCL 500 MG PO TABS
500 MG | ORAL_TABLET | Freq: Two times a day (BID) | ORAL | 1 refills | Status: DC
Start: 2019-06-17 — End: 2019-11-22

## 2019-06-17 MED ORDER — ATORVASTATIN CALCIUM 20 MG PO TABS
20 MG | ORAL_TABLET | ORAL | 3 refills | Status: DC
Start: 2019-06-17 — End: 2020-10-27

## 2019-06-17 NOTE — Telephone Encounter (Signed)
Pt is requesting a refill of the following medications     1.JANUVIA 100 MG tablet    2.carvedilol (COREG) 25 MG tablet    3.levothyroxine (SYNTHROID) 100 MCG   tablet    4. omeprazole (PRILOSEC) 40 MG delayed release capsule    5.atorvastatin (LIPITOR) 20 MG tablet    6. glimepiride (AMARYL) 2 MG tablet    7. metFORMIN (GLUCOPHAGE) 1000 MG tablet    Please note: pt said he is on 500 mg metformin  2x a day  does not correlate with the script on file     8. Pt also states he take diovan 32/25mg  I do not see this on file     Pt has a new pharmacy [facility] - [place], [place] - [address] - P [phone] - F [phone]

## 2019-09-25 ENCOUNTER — Ambulatory Visit: Admit: 2019-09-25 | Discharge: 2019-09-25 | Payer: MEDICARE | Attending: Internal Medicine | Primary: Internal Medicine

## 2019-09-25 DIAGNOSIS — E119 Type 2 diabetes mellitus without complications: Secondary | ICD-10-CM

## 2019-09-25 LAB — POCT GLYCOSYLATED HEMOGLOBIN (HGB A1C): Hemoglobin A1C: 6.8 %

## 2019-09-25 NOTE — Assessment & Plan Note (Signed)
Diabetes Mellitus Type II:  Home blood sugar records reviewed: fasting range: 120-130.  No significant episodes of hypoglycemia. Compliant with medications.  No polyuria, polydipsia, visual changes, foot problems, GI upset.  Diabetic diet compliance:  compliant most of the time.  Current exercise: none. Will check labs, and refill medications as appropriate.    Hypertension:  Denies CP, SOB, visual changes, dizziness, palpitations or HA.  He is adherent to a low sodium diet.  Blood pressure typically runs ok  outside of the office. Continue current medications.    Hyperlipidemia:  No new myalgias or GI upset on current medications.       Lab Results   Component Value Date    LABA1C 6.8 09/25/2019    LABA1C 6.6 05/07/2019    LABA1C 6.6 09/10/2018     Lab Results   Component Value Date    LABMICR 2.60 (H) 05/07/2019    CREATININE 1.0 05/07/2019     Lab Results   Component Value Date    ALT 11 05/07/2019    AST 14 (L) 05/07/2019     No components found for: CHLPL  Lab Results   Component Value Date    TRIG 73 05/07/2019     Lab Results   Component Value Date    HDL 30 (L) 05/07/2019     Lab Results   Component Value Date    LDLCALC 51 05/07/2019        BP Readings from Last 2 Encounters:   09/25/19 124/84   06/11/19 (!) 147/88     Hemoglobin A1C (%)   Date Value   09/25/2019 6.8     Microscopic Examination (no units)   Date Value   09/22/2014 Not Indicated     Microalbumin, Random Urine (mg/dL)   Date Value   09/81/1914 2.60 (H)     LDL Calculated (mg/dL)   Date Value   78/29/5621 51              Tobacco use:  Patient  reports that he has never smoked. He has never used smokeless tobacco.    If Smoker - Cessation materials given? NA    Is Daily aspirin on medication list?:  Yes    Diabetic retinal exam done? Yes   If yes, document in Health Maintenance.     Monofilament placed on counter? Yes    Shoes and socks removed? Yes    BP taken with correct size cuff? Yes   Repeated if > 130/80 Yes     Microalbumin performed if  applicable?  Yes

## 2019-09-25 NOTE — Assessment & Plan Note (Signed)
On synthroid,  Patient is compliant w medications, no side effects, effective, provides adequate symptom relief. No new symptoms or problems as noted by patient.  The problem is stable, no changes noted by patient. Will consider monitoring labs and refill medications as appropriate. Patient counseled and will continue current plan.

## 2019-09-25 NOTE — Assessment & Plan Note (Signed)
On coreg, ,rthythmol, and eliquis,  Patient is compliant w medications, no side effects, effective, provides adequate symptom relief. No new symptoms or problems as noted by patient.  The problem is stable, no changes noted by patient. Will consider monitoring labs and refill medications as appropriate. Patient counseled and will continue current plan.

## 2019-09-25 NOTE — Progress Notes (Signed)
Subjective:      Patient ID: Carl Blair is a 79 y.o. male.    HPI  Established patient here for a visit to manage acute and chronic medical conditions as detailed below.    Type 2 diabetes mellitus without complication, without long-term current use of insulin (HCC)  Diabetes Mellitus Type II:  Home blood sugar records reviewed: fasting range: 120-130.  No significant episodes of hypoglycemia. Compliant with medications.  No polyuria, polydipsia, visual changes, foot problems, GI upset.  Diabetic diet compliance:  compliant most of the time.  Current exercise: none. Will check labs, and refill medications as appropriate.    Hypertension:  Denies CP, SOB, visual changes, dizziness, palpitations or HA.  He is adherent to a low sodium diet.  Blood pressure typically runs ok  outside of the office. Continue current medications.    Hyperlipidemia:  No new myalgias or GI upset on current medications.       Lab Results   Component Value Date    LABA1C 6.8 09/25/2019    LABA1C 6.6 05/07/2019    LABA1C 6.6 09/10/2018     Lab Results   Component Value Date    LABMICR 2.60 (H) 05/07/2019    CREATININE 1.0 05/07/2019     Lab Results   Component Value Date    ALT 11 05/07/2019    AST 14 (L) 05/07/2019     No components found for: CHLPL  Lab Results   Component Value Date    TRIG 73 05/07/2019     Lab Results   Component Value Date    HDL 30 (L) 05/07/2019     Lab Results   Component Value Date    LDLCALC 51 05/07/2019        BP Readings from Last 2 Encounters:   09/25/19 124/84   06/11/19 (!) 147/88     Hemoglobin A1C (%)   Date Value   09/25/2019 6.8     Microscopic Examination (no units)   Date Value   09/22/2014 Not Indicated     Microalbumin, Random Urine (mg/dL)   Date Value   16/96/7893 2.60 (H)     LDL Calculated (mg/dL)   Date Value   81/04/7508 51              Tobacco use:  Patient  reports that he has never smoked. He has never used smokeless tobacco.    If Smoker - Cessation materials given? NA    Is Daily  aspirin on medication list?:  Yes    Diabetic retinal exam done? Yes   If yes, document in Health Maintenance.     Monofilament placed on counter? Yes    Shoes and socks removed? Yes    BP taken with correct size cuff? Yes   Repeated if > 130/80 Yes     Microalbumin performed if applicable?  Yes        Morbid obesity due to excess calories [facility])  Patient counseled,   Encouraged to lose weight, watch diet and exercise consistently.       Essential hypertension  This is a chronic problem. The problem is well controlled.  Patient monitors readings regularly. Pertinent negatives include no chest pain, focal sensory loss, focal weakness, leg pain, myalgias or shortness of breath. No headaches or chest pain. Takes medications regularly.  Blood pressure has been stable, blood work was reviewed, and advised patient to continue the current instructions or medications.       Chronic systolic  congestive heart failure (HCC)  No cp or sob or dizziness,   Patient is compliant w medications, no side effects, effective, provides adequate symptom relief. No new symptoms or problems as noted by patient.  The problem is stable, no changes noted by patient. Will consider monitoring labs and refill medications as appropriate. Patient counseled and will continue current plan.      Acquired hypothyroidism  On synthroid,  Patient is compliant w medications, no side effects, effective, provides adequate symptom relief. No new symptoms or problems as noted by patient.  The problem is stable, no changes noted by patient. Will consider monitoring labs and refill medications as appropriate. Patient counseled and will continue current plan.      Chronic a-fib (HCC)  On coreg, ,rthythmol, and eliquis,  Patient is compliant w medications, no side effects, effective, provides adequate symptom relief. No new symptoms or problems as noted by patient.  The problem is stable, no changes noted by patient. Will consider monitoring labs and refill  medications as appropriate. Patient counseled and will continue current plan.       Review of Systems  ROS: No unusual headaches or allergy symptoms or blurred vision. No prolonged cough. No flushing or facial pain or chest pain,dizziness, dyspnea, palpitations, or chest pain on exertion. No syncope. No nausea or vommitting or diarrhea.  No jaundice or abdominal pain, change in bowel habits, black or bloody stools.  No dysuria or hematuria or frequency of urination.   No myalgias or muscle pain.  No numbness, weakness, or tingling. No falls, or loss of consciousness. No weight loss or back pain. No falls.  No paresthesias. No joint swelling or redness. No joint pain. No recent weight loss. No focal weakness or sensory deficits or paresthesias, No confusion or altered sensorium. No hematemesis. No hearing loss. No siezures. All other systems were reviewed, and review was negative.   Objective:   Physical Exam  BP 124/84 (Site: Right Upper Arm, Position: Sitting, Cuff Size: Medium Adult)    Pulse 87    Temp 97.3 ??F (36.3 ??C) (Infrared)    Ht 6' (1.829 m)    Wt 259 lb 9.6 oz (117.8 kg)    SpO2 99%    BMI 35.21 kg/m??    The physical exam reveals a patient who appears well, alert and oriented x 3, pleasant, cooperative. Vitals are as noted. Head is atraumatic and normocephalic. Eyes reveal normal conjunctiva, cornea normal, pupils are equal and rective to light. Nasal mucosa is normal. Throat is normal without exudates. Ears reveal normal tympanic membranes.Neck is supple and free of adenopathy, or masses. No thyromegaly. No jugular venous distension. Lungs are clear to auscultation, no rales or rhonchi noted. Heart sounds are regular , no murmurs, clicks, gallops or rubs. Abdomen is soft, no tenderness, masses or organomegaly. Bowel sounds are normally heard.Pelvis: normal. Extremities are normal. Peripheral pulses are normal. Screening neurological exam is normal without focal findings. Cranial nerves are intact,  reflexes are symmetrical and muscle strength eaqual. Skin is normal without suspicious lesions noted.     Assessment:      Type 2 diabetes mellitus without complication, without long-term current use of insulin (HCC)  Diabetes Mellitus Type II:  Home blood sugar records reviewed: fasting range: 120-130.  No significant episodes of hypoglycemia. Compliant with medications.  No polyuria, polydipsia, visual changes, foot problems, GI upset.  Diabetic diet compliance:  compliant most of the time.  Current exercise: none. Will check labs, and refill medications  as appropriate.    Hypertension:  Denies CP, SOB, visual changes, dizziness, palpitations or HA.  He is adherent to a low sodium diet.  Blood pressure typically runs ok  outside of the office. Continue current medications.    Hyperlipidemia:  No new myalgias or GI upset on current medications.       Lab Results   Component Value Date    LABA1C 6.8 09/25/2019    LABA1C 6.6 05/07/2019    LABA1C 6.6 09/10/2018     Lab Results   Component Value Date    LABMICR 2.60 (H) 05/07/2019    CREATININE 1.0 05/07/2019     Lab Results   Component Value Date    ALT 11 05/07/2019    AST 14 (L) 05/07/2019     No components found for: CHLPL  Lab Results   Component Value Date    TRIG 73 05/07/2019     Lab Results   Component Value Date    HDL 30 (L) 05/07/2019     Lab Results   Component Value Date    LDLCALC 51 05/07/2019        BP Readings from Last 2 Encounters:   09/25/19 124/84   06/11/19 (!) 147/88     Hemoglobin A1C (%)   Date Value   09/25/2019 6.8     Microscopic Examination (no units)   Date Value   09/22/2014 Not Indicated     Microalbumin, Random Urine (mg/dL)   Date Value   41/66/0630 2.60 (H)     LDL Calculated (mg/dL)   Date Value   16/04/930 51              Tobacco use:  Patient  reports that he has never smoked. He has never used smokeless tobacco.    If Smoker - Cessation materials given? NA    Is Daily aspirin on medication list?:  Yes    Diabetic retinal exam  done? Yes   If yes, document in Health Maintenance.     Monofilament placed on counter? Yes    Shoes and socks removed? Yes    BP taken with correct size cuff? Yes   Repeated if > 130/80 Yes     Microalbumin performed if applicable?  Yes        Morbid obesity due to excess calories [facility])  Patient counseled,   Encouraged to lose weight, watch diet and exercise consistently.       Essential hypertension  This is a chronic problem. The problem is well controlled.  Patient monitors readings regularly. Pertinent negatives include no chest pain, focal sensory loss, focal weakness, leg pain, myalgias or shortness of breath. No headaches or chest pain. Takes medications regularly.  Blood pressure has been stable, blood work was reviewed, and advised patient to continue the current instructions or medications.       Chronic systolic congestive heart failure (HCC)  No cp or sob or dizziness,   Patient is compliant w medications, no side effects, effective, provides adequate symptom relief. No new symptoms or problems as noted by patient.  The problem is stable, no changes noted by patient. Will consider monitoring labs and refill medications as appropriate. Patient counseled and will continue current plan.      Acquired hypothyroidism  On synthroid,  Patient is compliant w medications, no side effects, effective, provides adequate symptom relief. No new symptoms or problems as noted by patient.  The problem is stable, no changes noted by patient. Will consider  monitoring labs and refill medications as appropriate. Patient counseled and will continue current plan.      Chronic a-fib (HCC)  On coreg, ,rthythmol, and eliquis,  Patient is compliant w medications, no side effects, effective, provides adequate symptom relief. No new symptoms or problems as noted by patient.  The problem is stable, no changes noted by patient. Will consider monitoring labs and refill medications as appropriate. Patient counseled and will continue  current plan.           Plan:      Labs ordered, reviewed.   Medications refilled.   All Health maintenance needs reviewed and the needful ordered.           Olga Millers, MD

## 2019-09-25 NOTE — Assessment & Plan Note (Signed)
No cp or sob or dizziness,   Patient is compliant w medications, no side effects, effective, provides adequate symptom relief. No new symptoms or problems as noted by patient.  The problem is stable, no changes noted by patient. Will consider monitoring labs and refill medications as appropriate. Patient counseled and will continue current plan.

## 2019-09-25 NOTE — Assessment & Plan Note (Signed)
This is a chronic problem. The problem is well controlled.  Patient monitors readings regularly. Pertinent negatives include no chest pain, focal sensory loss, focal weakness, leg pain, myalgias or shortness of breath. No headaches or chest pain. Takes medications regularly.  Blood pressure has been stable, blood work was reviewed, and advised patient to continue the current instructions or medications.

## 2019-09-25 NOTE — Assessment & Plan Note (Signed)
Patient counseled,   Encouraged to lose weight, watch diet and exercise consistently.

## 2019-11-09 ENCOUNTER — Inpatient Hospital Stay
Admission: EM | Admit: 2019-11-09 | Discharge: 2019-11-14 | Disposition: A | Discharge status: Home / Self Care | Payer: Medicare (Managed Care) | Admitting: Geriatric Medicine

## 2019-11-09 ENCOUNTER — Emergency Department: Admit: 2019-11-09 | Payer: Medicare (Managed Care)

## 2019-11-09 DIAGNOSIS — I110 Hypertensive heart disease with heart failure: Principal | ICD-10-CM

## 2019-11-09 LAB — HIGH SENSITIVITY TROPONIN
High Sensitivity Troponin: 8 ng/L (ref 0–20)
High Sensitivity Troponin: 8 ng/L (ref 0–20)

## 2019-11-09 LAB — CBC
Hematocrit: 31.8 % (ref 38.5–50.0)
Hemoglobin: 10.4 g/dL (ref 13.2–17.1)
MCH: 27.4 pg (ref 27.0–33.0)
MCHC: 32.9 g/dL (ref 32.0–36.0)
MCV: 83.4 fL (ref 80.0–100.0)
MPV: 7 fL (ref 7.5–11.5)
Platelets: 143 10*3/uL (ref 140–400)
RBC: 3.81 10*6/uL (ref 4.20–5.80)
RDW: 17.2 % (ref 11.0–15.0)
WBC: 8.7 10*3/uL (ref 3.8–10.8)

## 2019-11-09 LAB — 2019 NOVEL CORONAVIRUS (COVID-19), NAA-B: SARS-CoV-2: NOT DETECTED

## 2019-11-09 LAB — VENOUS BLOOD GAS, LINE/SYRINGE
%HBO2-Line Draw: 63.1 % (ref 40.0–70.0)
Base Excess-Line Draw: 0.6 mmol/L (ref <=3.0)
CO2 Content-Line Draw: 28 mmol/L (ref 25–29)
Carboxyhgb-Line Draw: 1.9 %
HCO3-Line Draw: 26 mmol/L (ref 24–28)
Methemoglobin-Line Draw: 0.6 % (ref 0.0–1.5)
PCO2-Line Draw: 45 mm Hg (ref 41–51)
PH-Line Draw: 7.37 (ref 7.32–7.42)
PO2-Line Draw: 35 mm Hg (ref 25–40)
Reduced Hemoglobin-Line Draw: 36 % (ref 0.0–5.0)

## 2019-11-09 LAB — BASIC METABOLIC PANEL
Anion Gap: 8 mmol/L (ref 3–16)
BUN: 22 mg/dL (ref 7–25)
CO2: 26 mmol/L (ref 21–33)
Calcium: 9.1 mg/dL (ref 8.6–10.3)
Chloride: 104 mmol/L (ref 98–110)
Creatinine: 1.21 mg/dL (ref 0.60–1.30)
Glucose: 159 mg/dL (ref 70–100)
Osmolality, Calculated: 293 mOsm/kg (ref 278–305)
Potassium: 4.5 mmol/L (ref 3.5–5.3)
Sodium: 138 mmol/L (ref 133–146)
eGFR AA CKD-EPI: 66 See note.
eGFR NONAA CKD-EPI: 57 See note.

## 2019-11-09 LAB — DIFFERENTIAL
Basophils Absolute: 78 /uL (ref 0–200)
Basophils Relative: 0.9 % (ref 0.0–1.0)
Eosinophils Absolute: 278 /uL (ref 15–500)
Eosinophils Relative: 3.2 % (ref 0.0–8.0)
Lymphocytes Absolute: 2514 /uL (ref 850–3900)
Lymphocytes Relative: 28.9 % (ref 15.0–45.0)
Monocytes Absolute: 1035 /uL (ref 200–950)
Monocytes Relative: 11.9 % (ref 0.0–12.0)
Neutrophils Absolute: 4794 /uL (ref 1500–7800)
Neutrophils Relative: 55.1 % (ref 40.0–80.0)
nRBC: 0 /100 WBC (ref 0–0)

## 2019-11-09 LAB — B NATRIURETIC PEPTIDE: BNP: 167 pg/mL (ref 0–100)

## 2019-11-09 LAB — TSH: TSH: 3.63 u[IU]/mL (ref 0.45–4.12)

## 2019-11-09 LAB — T4, FREE: Free T4: 1.21 ng/dL (ref 0.61–1.76)

## 2019-11-09 MED ORDER — ondansetron (ZOFRAN) injection 4 mg
4 | Freq: Three times a day (TID) | INTRAMUSCULAR | Status: AC | PRN
Start: 2019-11-09 — End: 2019-11-14

## 2019-11-09 MED ORDER — furosemide (LASIX) injection 20 mg
10 | Freq: Once | INTRAMUSCULAR | Status: AC
Start: 2019-11-09 — End: 2019-11-09
  Administered 2019-11-09: 19:00:00 20 mg via INTRAVENOUS

## 2019-11-09 MED ORDER — acetaminophen (TYLENOL) tablet 650 mg
325 | ORAL | Status: AC | PRN
Start: 2019-11-09 — End: 2019-11-14

## 2019-11-09 MED ORDER — carvediloL (COREG) tablet 25 mg
12.5 | Freq: Two times a day (BID) | ORAL | Status: AC
Start: 2019-11-09 — End: 2019-11-14
  Administered 2019-11-10 – 2019-11-14 (×10): 25 mg via ORAL

## 2019-11-09 MED ORDER — valsartan (DIOVAN) tablet 160 mg
80 | Freq: Every day | ORAL | Status: AC
Start: 2019-11-09 — End: 2019-11-12
  Administered 2019-11-10 – 2019-11-12 (×3): 160 mg via ORAL

## 2019-11-09 MED ORDER — aspirin EC tablet 81 mg
81 | Freq: Every day | ORAL | Status: AC
Start: 2019-11-09 — End: 2019-11-14
  Administered 2019-11-10 – 2019-11-14 (×5): 81 mg via ORAL

## 2019-11-09 MED ORDER — propafenone (RYTHMOL) immediate release tablet 225 mg
225 | Freq: Three times a day (TID) | ORAL | Status: AC
Start: 2019-11-09 — End: 2019-11-14
  Administered 2019-11-10 – 2019-11-14 (×15): 225 mg via ORAL

## 2019-11-09 MED ORDER — atorvastatin (LIPITOR) tablet 10 mg
10 | Freq: Every evening | ORAL | Status: AC
Start: 2019-11-09 — End: 2019-11-14
  Administered 2019-11-10 – 2019-11-14 (×5): 10 mg via ORAL

## 2019-11-09 MED ORDER — pantoprazole (PROTONIX) EC tablet 40 mg
40 | Freq: Every day | ORAL | Status: AC
Start: 2019-11-09 — End: 2019-11-14
  Administered 2019-11-10 – 2019-11-14 (×5): 40 mg via ORAL

## 2019-11-09 MED ORDER — ondansetron (ZOFRAN) tablet 4 mg
4 | Freq: Three times a day (TID) | ORAL | Status: AC | PRN
Start: 2019-11-09 — End: 2019-11-14

## 2019-11-09 MED ORDER — levothyroxine (SYNTHROID) tablet 100 mcg
100 | Freq: Every morning | ORAL | Status: AC
Start: 2019-11-09 — End: 2019-11-14
  Administered 2019-11-10 – 2019-11-14 (×5): 100 ug via ORAL

## 2019-11-09 MED ORDER — magnesium hydroxide (MILK OF MAGNESIA) 2,400 mg/10 mL oral suspension 10 mL
2400 | Freq: Every day | ORAL | Status: AC | PRN
Start: 2019-11-09 — End: 2019-11-14

## 2019-11-09 MED ORDER — tamsulosin (FLOMAX) capsule 0.4 mg
0.4 | Freq: Every evening | ORAL | Status: AC
Start: 2019-11-09 — End: 2019-11-14
  Administered 2019-11-10 – 2019-11-14 (×5): 0.4 mg via ORAL

## 2019-11-09 MED ORDER — apixaban (ELIQUIS) tablet 5 mg
5 | Freq: Two times a day (BID) | ORAL | Status: AC
Start: 2019-11-09 — End: 2019-11-14
  Administered 2019-11-10 – 2019-11-14 (×10): 5 mg via ORAL

## 2019-11-09 MED FILL — FUROSEMIDE 10 MG/ML INJECTION SOLUTION: 10 10 mg/mL | INTRAMUSCULAR | Qty: 2

## 2019-11-09 MED FILL — TAMSULOSIN 0.4 MG CAPSULE: 0.4 0.4 mg | ORAL | Qty: 1

## 2019-11-09 MED FILL — ELIQUIS 5 MG TABLET: 5 5 mg | ORAL | Qty: 1

## 2019-11-09 MED FILL — ATORVASTATIN 10 MG TABLET: 10 10 MG | ORAL | Qty: 1

## 2019-11-09 MED FILL — PROPAFENONE 225 MG TABLET: 225 225 MG | ORAL | Qty: 1

## 2019-11-09 MED FILL — CARVEDILOL 12.5 MG TABLET: 12.5 12.5 MG | ORAL | Qty: 2

## 2019-11-09 NOTE — Unmapped (Signed)
Report called to charge RN

## 2019-11-09 NOTE — Unmapped (Signed)
Hospitalist History and Physical  Admit Date: 11/09/2019  Admit Status: Emergency  Time: 4:35 PM    MRN: [id number]  PCP: Marcello Fennel, MD    Room: A07/A07W    CC:   Chief Complaint   Patient presents with   ??? Shortness of Breath       HPI:   The following is from the ER report. I have reviewed the information with the patient and made any necessary changes:    [name] is a 79 y.o. male with prior medical history significant for atrial fibrillation with reported cardioversion yesterday and anticoagulation on Eliquis, noninsulin-dependent diabetes, and dilated cardiomyopathy who presents to the Emergency Department with 71-month complaint of dyspnea with exertion and leg swelling.  ??  The patient reports worsening dyspnea with exertion as well as bilateral leg swelling over the last month.  He reports that he is unable to walk to the end of his driveway without feeling significantly short of breath.  He also reports worsening orthopnea.  He believes that he has gained approximately 10 pounds over the last month.  The patient reports prior history of congestive heart failure.  He denies home oxygen use.  He sleeps with a CPAP.  The patient reports associated wheezing but denies prior history of asthma or COPD.  He denies any tobacco use or secondhand smoke exposure.  The patient denies associated cough.  He denies recent fever or chills.  He denies recent URI symptoms.  He denies associated pain and specifically denies any chest pain.  ??  The patient reports he has been otherwise well.  The patient denies recent loss of appetite, nausea, vomiting, or diarrhea.     On my exam, pt and family at [facility] confirm the above. DOE x 1 month. Swelling to BLE last few weeks. Had cardioversion for Afib yesterday. Pt says it was successful. Appears to be in Afib at this time however. Last echo was in 2019. Says no echo was done prior to, or with, the cardioversion yesterday. Is on Eliquis for last 4-5 years, used to be on  coumadin years back but changed to Eliquis and has been compliant with this. No h/o DVT/PE, no recent long car rides or plane trips. Ambulatory at home. No calf tenderness/redness.     ROS:   No fever, no chills  No sore throat  No HA, no vision changes  No CP  No Abd pain, no nausea, no vomiting  No rash  No seizures, no syncope  No unusual bleeding  No mood changes    Past Medical History:   Diagnosis Date   ??? Atrial fibrillation (CMS Dx)     corrected with Mini-maze procedure   ??? Cataract     left eye   ??? Diabetes mellitus (CMS Dx)     type 2   ??? Dilated cardiomyopathy (CMS Dx)    ??? Hearing loss     left ear   ??? Hypercholesteremia    ??? Hypertension    ??? Sleep apnea    ??? Thyroid disease     hypothyroidism     Past Surgical History:   Procedure Laterality Date   ??? COLONOSCOPY W/ POLYPECTOMY      benign  has at 3 plus procedures   ??? HERNIA REPAIR  9/09    right inguinal, mini maze   ??? HERNIA REPAIR  2/10    herniated lungs bilateral (repaired with mesh)   ??? mini-maze  corrected his A-fib   ??? rt eye  02/2018   ??? SEPTOPLASTY     ??? UVULOPALATOPHARYNGOPLASTY     ??? UVULOPALATOPHARYNGOPLASTY      has had esophageal dilations in the past (Dr.Decter)   ??? WISDOM TOOTH EXTRACTION       (Not in a hospital admission)    No Known Allergies    Family History   Problem Relation Age of Onset   ??? Diabetes Mother         type 2   ??? Hypertension Mother    ??? Coronary artery disease Sister    ??? Diabetes Sister         type 2   ??? Coronary artery disease Sister    ??? Coronary artery disease Brother    ??? Hypertension Brother    ??? Diabetes Brother         type 2   ??? Hypertension Brother      Social History     Socioeconomic History   ??? Marital status: Married     Spouse name: None   ??? Number of children: None   ??? Years of education: None   ??? Highest education level: None   Occupational History   ??? None   Tobacco Use   ??? Smoking status: Never Smoker   ??? Smokeless tobacco: Never Used   ??? Tobacco comment: 03/19/07   Substance and Sexual  Activity   ??? Alcohol use: No   ??? Drug use: No     Comment: 04-14-11   ??? Sexual activity: None   Other Topics Concern   ??? Caffeine Use No   ??? Occupational Exposure Yes     Comment: retired Energy manager   ??? Exercise Yes   ??? Seat Belt Yes   Social History Narrative   ??? None     Social Determinants of Health     Financial Resource Strain:    ??? Difficulty of Paying Living Expenses:    Food Insecurity:    ??? Worried About Programme researcher, broadcasting/film/video in the Last Year:    ??? Barista in the Last Year:    Transportation Needs:    ??? Freight forwarder (Medical):    ??? Lack of Transportation (Non-Medical):    Physical Activity:    ??? Days of Exercise per Week:    ??? Minutes of Exercise per Session:    Stress:    ??? Feeling of Stress :    Social Connections:    ??? Frequency of Communication with Friends and Family:    ??? Frequency of Social Gatherings with Friends and Family:    ??? Attends Religious Services:    ??? Database administrator or Organizations:    ??? Attends Engineer, structural:    ??? Marital Status:    Intimate Programme researcher, broadcasting/film/video Violence:    ??? Fear of Current or Ex-Partner:    ??? Emotionally Abused:    ??? Physically Abused:    ??? Sexually Abused:        Physical Exam:  BP (!) 190/105    Pulse 78    Temp 98.7 ??F (37.1 ??C) (Oral)    Resp (!) 39    Ht 6' (1.829 m)    Wt (!) 248 lb 9.6 oz (112.8 kg)    SpO2 96%    BMI 33.72 kg/m??   Gen: AAO, NAD  HEENT: NC/AT, PERRL, EOMI, OP clear, MMM  Neck: supple w/o rigidity  Heart: IRIR, no mgr  Lungs: CTAB  Abd: SNTND, BS+  Ext: 1+ BLE edema, no calf TTP  Skin: no rash, no lesions  Psych: mood normal  Neuro: grossly intact    LABS:  Recent Results (from the past 24 hour(s))   Basic metabolic panel    Collection Time: 11/09/19 12:26 PM   Result Value Ref Range    Sodium 138 133 - 146 mmol/L    Potassium 4.5 3.5 - 5.3 mmol/L    Chloride 104 98 - 110 mmol/L    CO2 26 21 - 33 mmol/L    Anion Gap 8 3 - 16 mmol/L    BUN 22 7 - 25 mg/dL    Creatinine 1.61 0.96 - 1.30 mg/dL    Glucose 045 (H)  70 - 100 mg/dL    Calcium 9.1 8.6 - 40.9 mg/dL    Osmolality, Calculated 293 278 - 305 mOsm/kg    eGFR AA CKD-EPI 66 See note.    eGFR NONAA CKD-EPI 57 See note.   CBC    Collection Time: 11/09/19 12:26 PM   Result Value Ref Range    WBC 8.7 3.8 - 10.8 10E3/uL    RBC 3.81 (L) 4.20 - 5.80 10E6/uL    Hemoglobin 10.4 (L) 13.2 - 17.1 g/dL    Hematocrit 81.1 (L) 38.5 - 50.0 %    MCV 83.4 80.0 - 100.0 fL    MCH 27.4 27.0 - 33.0 pg    MCHC 32.9 32.0 - 36.0 g/dL    RDW 91.4 (H) 78.2 - 15.0 %    Platelets 143 140 - 400 10E3/uL    MPV 7.0 (L) 7.5 - 11.5 fL   Differential    Collection Time: 11/09/19 12:26 PM   Result Value Ref Range    Neutrophils Relative 55.1 40.0 - 80.0 %    Lymphocytes Relative 28.9 15.0 - 45.0 %    Monocytes Relative 11.9 0.0 - 12.0 %    Eosinophils Relative 3.2 0.0 - 8.0 %    Basophils Relative 0.9 0.0 - 1.0 %    nRBC 0 0 - 0 /100 WBC    Neutrophils Absolute 4,794 1,500 - 7,800 /uL    Lymphocytes Absolute 2,514 850 - 3,900 /uL    Monocytes Absolute 1,035 (H) 200 - 950 /uL    Eosinophils Absolute 278 15 - 500 /uL    Basophils Absolute 78 0 - 200 /uL   B Natriuretic Peptide    Collection Time: 11/09/19 12:26 PM   Result Value Ref Range    BNP 167 (H) 0 - 100 pg/mL   High Sensitivity Troponin    Collection Time: 11/09/19 12:26 PM   Result Value Ref Range    High Sensitivity Troponin 8 0 - 20 ng/L   Venous blood gas    Collection Time: 11/09/19 12:26 PM   Result Value Ref Range    PH-Line Draw 7.37 7.32 - 7.42    PCO2-Line Draw 45 41 - 51 mm Hg    PO2-Line Draw 35 25 - 40 mm Hg    HCO3-Line Draw 26 24 - 28 mmol/L    CO2 Content-Line Draw 28 25 - 29 mmol/L    Base Excess-Line Draw 0.6 -2.0 - 3.0 mmol/L    %HBO2-Line Draw 63.1 40.0 - 70.0 %    Carboxyhgb-Line Draw 1.9 %    Methemoglobin-Line Draw 0.6 0.0 - 1.5 %    Reduced Hemoglobin-Line Draw 36.0 (H) 0.0 - 5.0 %   TSH (Thyroid  Stimulating Hormone)    Collection Time: 11/09/19 12:26 PM   Result Value Ref Range    TSH 3.63 0.45 - 4.12 uIU/mL   T4, free     Collection Time: 11/09/19 12:26 PM   Result Value Ref Range    Free T4 1.21 0.61 - 1.76 ng/dL   High Sensitivity Troponin ( )    Collection Time: 11/09/19  1:30 PM   Result Value Ref Range    High Sensitivity Troponin 8 0 - 20 ng/L       IMAGING:  X-ray Chest PA and Lateral    Result Date: 11/09/2019  IMPRESSION: Interval improvement in the previously seen bibasilar opacities. No evidence of pulmonary edema. Report Verified by: Norberta Keens, MD at 11/09/2019 1:24 PM EDT      EKG: reviewed     ASSESSMENT  1. DOE  2. Suspected CHF exacerbation  3. H/o Afib, s/p cardioversion yesterday but now back in Afib  4. DM  5. Dilated CM  6. HTN, uncontrolled  7. OSA, home CPAP  8. Hypothyroid     PLAN  Place in OBSERVATION status  Tele  Lasix IV x , monitor response to this  Monitor I/O, daily wts  Consult cardiology for HF and Afib  Echo (inpatient vs outpatient, defer to cardiology)  PRN HDLZ for elevated BPs  Labs in am, monitor SCr        Earline Mayotte, D.O.

## 2019-11-09 NOTE — Unmapped (Signed)
ED Attending Attestation Note    Date of service:  11/09/2019    This patient was seen by the advanced practice provider.  I have seen and examined the patient, agree with the workup, evaluation, management and diagnosis. The care plan has been discussed.  I have reviewed the ECG and concur with the advanced practice provider's interpretation.    My assessment reveals a 79 y.o. male with a history of Afib, DMT2, HLD, HTN, sleep apnea, who presents today with SOB. His family notes that his legs are more swollen than usual. He saw a cardiologist for cardioversion yesterday and says he mentioned his SOB but was not worked up. Pt endorses chest tightness yesterday which has subsided today. He reports 10 lbs of recent weight gain. He denies current chest pain, coughing, otherwise ill symptoms, recent changes in medications.     On exam, crackles at bilateral lung bases, tachycardic ~125, non-pitting edema to the shins bilaterally.      Scribe Attestation  By signing my name below, I, HARDY SANDEFUR, attest that this documentation has been prepared under the direction and in the presence of Seymour Bars, MD   Scribe name: Jacqlyn Krauss Date: 11/09/2019     The scribe???s documentation has been prepared under my direction and personally reviewed by me in its entirety. I confirm that the note above accurately reflects all work, treatment, procedures, and medical decision making performed by me.

## 2019-11-09 NOTE — ED Triage Notes (Signed)
Patient with SOB that started one month ago. Reports symptoms have worsened. Patient reports being scheduled Cardio convert on yesterday.

## 2019-11-09 NOTE — Unmapped (Signed)
[place] [facility]  Case Management/Social Work Department  Discharge Planning Screen    Screening Questions     Do you need help filling out medical forms: No  Is patient from anywhere other than a private residence? (shelter, SNF, LTC, IPR, LTAC, etc.): No  Do you have any services that come into the house to help you? (COA, private duty, HHC, etc): No  Do you have barriers getting to follow up appointment or obtaining prescriptions?: No  Have you been to the (ED/hospital) 4x times in the past 6 months? : No    CM @ bedside , introduced self and role . Pt is from home, independent. No current services and none anticipated at DC.

## 2019-11-09 NOTE — Unmapped (Signed)
Admission d/w ER .  Awaiting updated prior to admission med list to place admit orders.    Briona Korpela, D.O.

## 2019-11-09 NOTE — Unmapped (Signed)
Pt arrived to the unit, assisted to the bed, pt able to ambulate to bathroom himself. Call light provided and educated on fall prevention and other safety. Tele on, assisted on  ordering dinner. Bed in low position. Denies any other needs at this time. Will continue to monitor.

## 2019-11-09 NOTE — Plan of Care (Signed)
Patient independent from home . Anticipate no needs at DC

## 2019-11-09 NOTE — Unmapped (Signed)
[facility] [place] ED Encounter      Reason for Visit: Shortness of Breath      Patient History     HPI: [name] is a 79 y.o. male with prior medical history significant for atrial fibrillation with reported cardioversion yesterday and anticoagulation on Eliquis, noninsulin-dependent diabetes, and dilated cardiomyopathy who presents to the Emergency Department with 22-month complaint of dyspnea with exertion and leg swelling.    The patient reports worsening dyspnea with exertion as well as bilateral leg swelling over the last month.  He reports that he is unable to walk to the end of his driveway without feeling significantly short of breath.  He also reports worsening orthopnea.  He believes that he has gained approximately 10 pounds over the last month.  The patient reports prior history of congestive heart failure.  He denies home oxygen use.  He sleeps with a CPAP.  The patient reports associated wheezing but denies prior history of asthma or COPD.  He denies any tobacco use or secondhand smoke exposure.  The patient denies associated cough.  He denies recent fever or chills.  He denies recent URI symptoms.  He denies associated pain and specifically denies any chest pain.    The patient reports he has been otherwise well.  The patient denies recent loss of appetite, nausea, vomiting, or diarrhea.      Past Medical History:   Diagnosis Date   ??? Atrial fibrillation (CMS Dx)     corrected with Mini-maze procedure   ??? Cataract     left eye   ??? Diabetes mellitus (CMS Dx)     type 2   ??? Dilated cardiomyopathy (CMS Dx)    ??? Hearing loss     left ear   ??? Hypercholesteremia    ??? Hypertension    ??? Sleep apnea    ??? Thyroid disease     hypothyroidism       Past Surgical History:   Procedure Laterality Date   ??? COLONOSCOPY W/ POLYPECTOMY      benign  has at 3 plus procedures   ??? HERNIA REPAIR  9/09    right inguinal, mini maze   ??? HERNIA REPAIR  2/10    herniated lungs  bilateral (repaired with mesh)   ??? mini-maze      corrected his A-fib   ??? rt eye  02/2018   ??? SEPTOPLASTY     ??? UVULOPALATOPHARYNGOPLASTY     ??? UVULOPALATOPHARYNGOPLASTY      has had esophageal dilations in the past (Dr.Decter)   ??? WISDOM TOOTH EXTRACTION         Social History  [employer]  reports that he has never smoked. He has never used smokeless tobacco. He reports that he does not drink alcohol and does not use drugs.    Previous Medications    APIXABAN (ELIQUIS) 5 MG TAB    Take 5 mg by mouth 2 times a day.    ASPIRIN 81 MG EC TABLET    Take 81 mg by mouth daily.    ATORVASTATIN (LIPITOR) 10 MG TABLET    Take 10 mg by mouth at bedtime.     CARVEDILOL (COREG) 25 MG TABLET    Take 25 mg by mouth 2 (two) times daily.      CHORIONIC GONADOTROPIN, HUMAN 10,000 UNIT INJECTION    Inject 10,000 Units into the muscle once.    GLIMEPIRIDE (AMARYL) 2 MG TABLET    Take 1 tablet  by mouth  every morning before  breakfast    GLUC/CHON-MSM#1/C/MANG/BOS/BOR (OSTEO BI-FLEX ORAL)    Take by mouth. OSTEO BI-FLEX JOINT SHIELD TABS     GLYBURIDE-METFORMIN (GLUCOVANCE) 2.5-500 MG PER TABLET    Take 2 tablets by mouth 2 times a day.     LEVOTHYROXINE (SYNTHROID, LEVOTHROID) 100 MCG TABLET    Take 100 mcg by mouth every morning.     LOSARTAN-HYDROCHLOROTHIAZIDE (HYZAAR) 100-25 MG PER TABLET    Take 1 tablet by mouth daily.    OMEPRAZOLE (PRILOSEC) 40 MG CAPSULE    Take 40 mg by mouth every morning.     PROPAFENONE (RYTHMOL) 225 MG TABLET    Take 225 mg by mouth 3 times a day.    SAW PALMETTO XTR/ZINC PICOLIN (SAW PALMETTO EXTRACT ORAL)    Take 2 tablets by mouth 2 times a day.     TAMSULOSIN (FLOMAX) 0.4 MG CAP    Take 0.4 mg by mouth at bedtime.       Allergies:   Allergies as of 11/09/2019   ??? (No Known Allergies)       Review of Systems     Review of Systems   Constitutional: Negative for chills, fever and malaise/fatigue.   HENT: Negative for ear pain and sinus pain.    Eyes: Negative for pain.   Respiratory: Positive for  shortness of breath and wheezing. Negative for cough.    Cardiovascular: Positive for orthopnea and leg swelling (Bilateral). Negative for chest pain.   Gastrointestinal: Negative for abdominal pain, diarrhea, nausea and vomiting.   Genitourinary: Negative for flank pain.   Musculoskeletal: Negative for back pain, joint pain, myalgias and neck pain.   Skin: Negative for rash.   Neurological: Negative for headaches.       Physical Exam     ED Triage Vitals [11/09/19 1157]   Vital Signs Group      Temp 98.7 ??F (37.1 ??C)      Temp Source Oral      Heart Rate 70      Heart Rate Source Monitor      Resp 18      SpO2 95 %      BP 175/87      MAP (mmHg) 113      BP Location Right arm      BP Method Automatic      Patient Position Sitting   SpO2 95 %   O2 Device None (Room air)     Vitals:    11/09/19 1425   BP: (!) 190/105   Pulse: 78   Resp: (!) 39   Temp:    SpO2: 96%   Afebrile, hypertensive, no tachycardia or tachypnea or hypoxia on room air       Physical Exam  Vitals and nursing note reviewed.   Constitutional:       General: He is not in acute distress.     Appearance: Normal appearance. He is not ill-appearing or diaphoretic.   HENT:      Head: Normocephalic and atraumatic.      Right Ear: External ear normal.      Left Ear: External ear normal.      Nose: Nose normal.      Mouth/Throat:      Mouth: Mucous membranes are moist.   Eyes:      General:         Right eye: No discharge.         Left eye:  No discharge.      Extraocular Movements: Extraocular movements intact.   Neck:      Vascular: No JVD.      Trachea: No tracheal deviation.   Cardiovascular:      Rate and Rhythm: Normal rate and regular rhythm.      Heart sounds: Normal heart sounds. No murmur heard.   No friction rub. No gallop.    Pulmonary:      Effort: Pulmonary effort is normal. No respiratory distress.      Breath sounds: No stridor. Rales present. No wheezing or rhonchi.      Comments: Crackles auscultated in bilateral lower lobes; good air  movement throughout; no wheezes; able to speak in full sentences; no cough; normal work of breathing  Chest:      Chest wall: No tenderness.   Abdominal:      General: There is no distension.      Palpations: Abdomen is soft.      Tenderness: There is no abdominal tenderness.   Musculoskeletal:         General: No deformity or signs of injury. Normal range of motion.      Cervical back: Normal range of motion.      Right lower leg: Edema present.      Left lower leg: Edema present.      Comments: +1 pretibial pitting edema bilaterally; lower extremities appear symmetrical   Skin:     General: Skin is warm and dry.      Coloration: Skin is not pale.      Findings: No erythema or rash.   Neurological:      Mental Status: He is alert.      Gait: Gait is intact. Gait normal.   Psychiatric:         Behavior: Behavior normal.             Procedures:   None        Diagnostic Studies     Labs:  Please see electronic medical record for any tests performed in the ED     Radiology:  Please see electronic medical record for any tests performed in the ED    EKG:  Rhythm: normal sinus   Rate: 62  Axis: normal  Ectopy: none  Conduction: right bundle branch block (incomplete) and 1st degree AV block  ST Segments: normal  T Waves: no acute change  Q Waves: none    Clinical Impression: non-specific EKG; compared to prior EKG of 03/07/2018 which found controlled atrial fibrillation with incomplete right bundle branch block    Interpreted by me, Tarri Fuller        Summary of medications given in ED     Medications   furosemide (LASIX) injection 20 mg (20 mg Intravenous Given 11/09/19 1450)           ED Course and Medical-Decision-Making     [name] presented to the Emergency Department with complaint of Shortness of Breath      Physical exam was remarkable for crackles auscultated in bilateral lower lobes as well as +1 pretibial pitting edema bilaterally.  Remaining physical exam was reassuring.  Please see above for full  details, but briefly, the patient was in no respiratory distress while on room air.  He was able to speak in full sentences and did not demonstrate a cough.  He was neurovascularly intact in the lower extremities appeared symmetrical.  He was able to demonstrate a normal gait.  The patient was afebrile.  Initially, he had no tachycardia, tachypnea, or hypoxia on room air.  He subsequently became tachypneic.    The patient's workup thus far has actually been reassuring.  EKG found normal sinus rhythm with a first-degree AV block and an incomplete right bundle branch block.  When compared to prior EKG from 03/07/2018, this incomplete right bundle branch block was also seen on prior EKG.  This prior EKG found atrial fibrillation, so PR interval could not be recorded, making it impossible to determine if the patient had first-degree AV block at that time.  Chest x-ray was unremarkable and specifically found no evidence of pulmonary edema.  BNP was only mildly elevated at 167.  2 high-sensitivity troponins were obtained with a delta of 0.  Renal panel found normal kidney function and no electrolyte abnormalities.  VBG found normal pH and CO2 levels.  CBC with differential found no evidence of leukocytosis but did find persistence of a baseline normocytic anemia of 10.4.  Free T4 and TSH were within normal limits.    A walk test was performed which found that the patient was hypoxic with an SPO2 of 93%.    The patient obviously has close cardiology follow-up as he had a cardioversion performed yesterday.  I spoke with the patient about his options of admission to the hospital versus outpatient follow-up.  The patient is concerned that his symptoms have been ongoing for the last month with no improvement.  He would prefer admission to the hospital for evaluation and I am comfortable with his decision.  I spoke with Dr. Burnadette Peter of cardiology who, after discussion of the patient's presentation, recommended that the patient be  given a very small gentle 20 mg dose of IV Lasix for diuresis.  Dr. Burnadette Peter wonders if the patient's symptoms were acutely worsened by the fluid bolus that was administered during yesterday's cardioversion.     At this time, the patient's work-up is complete and he is to be admitted to the hospital to the hospitalist service to the telemetry for for further evaluation of his symptoms.  The patient and his family understand this proposed disposition plan and are in agreement.  They have no further questions or complaints.  I have spoken with both our cardiology service as well as the hospitalist, who have accepted care of this patient.            Chart review:  Chart review shows that the patient's last presentation to this emergency department was on 03/07/2018.  At this visit, the patient was admitted to the hospital with diagnosis of pneumonia.    [name] was seen by myself as well as the Attending, Dr. Seymour Bars, MD, who is in agreement with the assessment and plan.          Impression     1. Dyspnea on minimal exertion    2. Leg swelling    3. Tachypnea          Plan     1) DISPOSITION: this patient was admitted to the hospital to the hospitalist service to the telemetry floor with cardiology following  2) his PCP is Marcello Fennel, MD        The patient, his wife, and his niece were informed of the results of any tests, a time was given to answer questions, and a plan was proposed. After our discussion, he agreed with the treatment plan and felt comfortable being admitted to the hospital.  Tarri Fuller, PA-C       [place], [place]  11/09/19 [phone]

## 2019-11-10 LAB — BASIC METABOLIC PANEL
Anion Gap: 8 mmol/L (ref 3–16)
Anion Gap: 8 mmol/L (ref 3–16)
BUN: 22 mg/dL (ref 7–25)
BUN: 22 mg/dL (ref 7–25)
CO2: 28 mmol/L (ref 21–33)
CO2: 28 mmol/L (ref 21–33)
Calcium: 8.7 mg/dL (ref 8.6–10.3)
Calcium: 8.7 mg/dL (ref 8.6–10.3)
Chloride: 103 mmol/L (ref 98–110)
Chloride: 103 mmol/L (ref 98–110)
Creatinine: 1.2 mg/dL (ref 0.60–1.30)
Creatinine: 1.2 mg/dL (ref 0.60–1.30)
GFR, Estimated: 58 See note.
GFR, Estimated: 67 See note.
Glucose: 152 mg/dL (ref 70–100)
Glucose: 152 mg/dL — ABNORMAL HIGH (ref 70–100)
Osmolality Calc: 294 mOsm/kg (ref 278–305)
Osmolality, Calculated: 294 mOsm/kg (ref 278–305)
Potassium: 3.8 mmol/L (ref 3.5–5.3)
Potassium: 3.8 mmol/L (ref 3.5–5.3)
Sodium: 139 mmol/L (ref 133–146)
Sodium: 139 mmol/L (ref 133–146)
eGFR AA CKD-EPI: 67 See note.
eGFR NONAA CKD-EPI: 58 See note.

## 2019-11-10 LAB — DIFFERENTIAL
Basophils %: 1.1 % — ABNORMAL HIGH (ref 0.0–1.0)
Basophils Absolute: 111 /uL (ref 0–200)
Basophils Absolute: 111 /uL (ref 0–200)
Basophils Relative: 1.1 % — ABNORMAL HIGH (ref 0.0–1.0)
Eosinophils %: 4.4 % (ref 0.0–8.0)
Eosinophils Absolute: 444 /uL (ref 15–500)
Eosinophils Absolute: 444 /uL (ref 15–500)
Eosinophils Relative: 4.4 % (ref 0.0–8.0)
Lymphocytes %: 39 % (ref 15.0–45.0)
Lymphocytes Absolute: 3939 /uL — ABNORMAL HIGH (ref 850–3900)
Lymphocytes Absolute: 3939 /uL — ABNORMAL HIGH (ref 850–3900)
Lymphocytes Relative: 39 % (ref 15.0–45.0)
Monocytes %: 12.1 % — ABNORMAL HIGH (ref 0.0–12.0)
Monocytes Absolute: 1222 /uL — ABNORMAL HIGH (ref 200–950)
Monocytes Absolute: 1222 /uL — ABNORMAL HIGH (ref 200–950)
Monocytes Relative: 12.1 % — ABNORMAL HIGH (ref 0.0–12.0)
Neutrophils Absolute: 4383 /uL (ref 1500–7800)
Neutrophils Absolute: 4383 /uL (ref 1500–7800)
Neutrophils Relative: 43.4 % (ref 40.0–80.0)
Segs Relative: 43.4 % (ref 40.0–80.0)
nRBC: 0 /100 WBC (ref 0–0)
nRBC: 0 /100{WBCs} (ref 0–0)

## 2019-11-10 LAB — CBC
Hematocrit: 30.6 % (ref 38.5–50.0)
Hematocrit: 30.6 % — ABNORMAL LOW (ref 38.5–50.0)
Hemoglobin: 10 g/dL (ref 13.2–17.1)
Hemoglobin: 10 g/dL — ABNORMAL LOW (ref 13.2–17.1)
MCH: 26.9 pg (ref 27.0–33.0)
MCH: 26.9 pg — ABNORMAL LOW (ref 27.0–33.0)
MCHC: 32.7 g/dL (ref 32.0–36.0)
MCHC: 32.7 g/dL (ref 32.0–36.0)
MCV: 82.2 fL (ref 80.0–100.0)
MCV: 82.2 fL (ref 80.0–100.0)
MPV: 6.7 fL (ref 7.5–11.5)
MPV: 6.7 fL — ABNORMAL LOW (ref 7.5–11.5)
Platelets: 146 10*3/uL (ref 140–400)
Platelets: 146 10*3/uL (ref 140–400)
RBC: 3.72 10*6/uL (ref 4.20–5.80)
RBC: 3.72 10*6/uL — ABNORMAL LOW (ref 4.20–5.80)
RDW: 17.3 % (ref 11.0–15.0)
RDW: 17.3 % — ABNORMAL HIGH (ref 11.0–15.0)
WBC: 10.1 10*3/uL (ref 3.8–10.8)
WBC: 10.1 10*3/uL (ref 3.8–10.8)

## 2019-11-10 LAB — COVID-19: SARS-CoV-2: NOT DETECTED

## 2019-11-10 LAB — POCT GLUCOSE MONITORING DEVICE: Glucose: 187 mg/dL — ABNORMAL HIGH (ref 70–100)

## 2019-11-10 LAB — BRAIN NATRIURETIC PEPTIDE: BNP: 208 pg/mL — ABNORMAL HIGH (ref 0–100)

## 2019-11-10 LAB — POC GLU MONITORING DEVICE: POC Glucose Monitoring Device: 187 mg/dL (ref 70–100)

## 2019-11-10 LAB — B NATRIURETIC PEPTIDE: BNP: 208 pg/mL — ABNORMAL HIGH (ref 0–100)

## 2019-11-10 MED ORDER — hydrALAZINE (APRESOLINE) tablet 10 mg
10 | Freq: Four times a day (QID) | ORAL | Status: AC | PRN
Start: 2019-11-10 — End: 2019-11-14
  Administered 2019-11-12 – 2019-11-13 (×2): 10 mg via ORAL

## 2019-11-10 MED ORDER — furosemide (LASIX) injection 40 mg
10 | Freq: Once | INTRAMUSCULAR | Status: AC
Start: 2019-11-10 — End: 2019-11-10
  Administered 2019-11-10: 14:00:00 40 mg via INTRAVENOUS

## 2019-11-10 MED FILL — PROPAFENONE 225 MG TABLET: 225 225 MG | ORAL | Qty: 1

## 2019-11-10 MED FILL — CARVEDILOL 12.5 MG TABLET: 12.5 12.5 MG | ORAL | Qty: 2

## 2019-11-10 MED FILL — FUROSEMIDE 10 MG/ML INJECTION SOLUTION: 10 10 mg/mL | INTRAMUSCULAR | Qty: 4

## 2019-11-10 MED FILL — ELIQUIS 5 MG TABLET: 5 5 mg | ORAL | Qty: 1

## 2019-11-10 MED FILL — ASPIRIN 81 MG TABLET,DELAYED RELEASE: 81 81 MG | ORAL | Qty: 1

## 2019-11-10 MED FILL — SYNTHROID 100 MCG TABLET: 100 100 mcg | ORAL | Qty: 1

## 2019-11-10 MED FILL — ATORVASTATIN 10 MG TABLET: 10 10 MG | ORAL | Qty: 1

## 2019-11-10 MED FILL — PANTOPRAZOLE 40 MG TABLET,DELAYED RELEASE: 40 40 MG | ORAL | Qty: 1

## 2019-11-10 MED FILL — TAMSULOSIN 0.4 MG CAPSULE: 0.4 0.4 mg | ORAL | Qty: 1

## 2019-11-10 MED FILL — VALSARTAN 80 MG TABLET: 80 80 MG | ORAL | Qty: 2

## 2019-11-10 NOTE — Unmapped (Signed)
[facility] PROGRESS NOTE:    Admit Date: 11/09/2019     Visit Date: 11/10/2019  Admit class: Observation      [id number]    Subjective:       Interval History: feeling better after Lasix. Good UOP. No CP. SOB improving.     ROS: No CP/SOB, No N/V, No fever/chills, No HA    Physical Exam:    BP 157/77 (BP Location: Right arm, Patient Position: Sitting)    Pulse 66    Temp 98 ??F (36.7 ??C) (Oral)    Resp 18    Ht 6' (1.829 m)    Wt (!) 255 lb (115.7 kg)    SpO2 94%    BMI 34.58 kg/m??   General appearance: alert, appears stated age and cooperative  Lungs: clear to auscultation bilaterally and no respiratory distress  Heart: regular rate and rhythm, S1, S2 normal, no murmur, click, rub or gallop  Abdomen: soft, non-tender; bowel sounds normal; no masses,  no organomegaly  Extremities: +edema  Skin: Skin color, texture, turgor normal. No rashes or lesions  Neurologic: Grossly normal     No results for input(s): POCGLU in the last 72 hours.    Intake/Output Summary (Last 24 hours) at 11/10/2019 1147  Last data filed at 11/10/2019 0909  Gross per 24 hour   Intake 720 ml   Output --   Net 720 ml       Labs:      Recent Labs     11/09/19  1226 11/10/19  0539   WBC 8.7 10.1   HGB 10.4* 10.0*   HCT 31.8* 30.6*   PLT 143 146                                                                  Recent Labs     11/09/19  1226 11/10/19  0539   NA 138 139   K 4.5 3.8   CL 104 103   CO2 26 28   BUN 22 22   CREATININE 1.21 1.20   GLUCOSE 159* 152*     No results for input(s): INR in the last 72 hours.    ??? apixaban  5 mg Oral BID   ??? aspirin  81 mg Oral Daily 0900   ??? atorvastatin  10 mg Oral Nightly (2100)   ??? carvediloL  25 mg Oral BID   ??? levothyroxine  100 mcg Oral QAM   ??? pantoprazole  40 mg Oral DAILY 0600   ??? propafenone  225 mg Oral TID   ??? tamsulosin  0.4 mg Oral Nightly (2100)   ??? valsartan  160 mg Oral Daily 0900       Assessment & Plan:    ASSESSMENT  1. DOE  2. CHF exacerbation  3. H/o Afib, s/p cardioversion the day PTA , ? back  in Afib in ER  4. H/o A-flutter s/p ablation and mini-Maze procedure  5. DM  6. Dilated CM  7. HTN, uncontrolled  8. OSA, home CPAP  9. Hypothyroid   ??  PLAN  Tele  Lasix IV  Monitor I/O, daily wts  Consulted cardiology, apprec help  Echo tomorrow  PRN HDLZ for elevated BPs  Cont home Eliquis  Labs in am,  monitor SCr      Disposition: OBS    Earline Mayotte, D.O.

## 2019-11-10 NOTE — Unmapped (Signed)
Problem: Knowledge deficit related to self-management of chronic disease  Goal: Patient/family/caregiver demonstrates understanding of disease process, treatment plan, medications, and discharge instructions  Outcome: Progressing     Problem: Discharge Planning  Goal: Identify discharge needs  Outcome: Progressing  This nurse ambulated pt in the hallway about 250-300 feet, denies any SOB throughout.

## 2019-11-10 NOTE — Unmapped (Signed)
[facility] CONSULT NOTE    Referring Physician: Earline Mayotte, DO  Consult Attending: Koleen Distance, MD    [name] is an 79 y.o. male.     Reason for Consult: dyspena  Chief Complaint and History of Present Illness:   Mr. 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     He reports 1 month history of worsening shortness of breath with any level of activity. He notices with activities such as walking his 700 foot driveway which now makes him winded along with doing trimming in his yard which used to take him 90 minutes but now he has to take multiple breaks. He also notices shortness of breath with flight of stairs. He sleeps on 1 pillow with head of bed elevated. Denies waking short of breath. He notes swelling in lower extremities. He denies any heart palpitations and did not have any improvement in symptoms with recent cardioversion. He reports isolated episode of chest pain weeks ago while walking around his home. He denies any history of smoking. He previously worked in a factory and had a number of exposures including to formaldehyde. Family history notable for mother and father who both had heart disease at ages of 60 and 41 respectively. He does not use a diuretic at home and has no history of heart failure.    In ED, noted to have BNP of 208, hsTrop 8 x 2, WBC 10.1 today. He received lasix 20 mg iv with improvement in dyspnea this morning.     CXR (11/09/2019):  Interval improvement in the previously seen bibasilar opacities.   No evidence of pulmonary edema.     TTE (04/19/17):  Left ventricle: Systolic function was normal. The estimated   ????ejection fraction was in the range of 55% to 60%. Wall motion was   ????normal; there were no  regional wall motion abnormalities.   - Right ventricle: Systolic function was normal. TAPSE: 1.7cm.     History:  Past Medical History:   Diagnosis Date   ??? Atrial fibrillation (CMS Dx)     corrected with Mini-maze procedure   ??? Cataract     left eye   ??? Diabetes mellitus (CMS Dx)     type 2   ??? Dilated cardiomyopathy (CMS Dx)    ??? Hearing loss     left ear   ??? Hypercholesteremia    ??? Hypertension    ??? Sleep apnea    ??? Thyroid disease     hypothyroidism     Past Surgical History:   Procedure Laterality Date   ??? COLONOSCOPY W/ POLYPECTOMY      benign  has at 3 plus procedures   ??? HERNIA REPAIR  9/09    right inguinal, mini maze   ??? HERNIA REPAIR  2/10    herniated lungs bilateral (repaired with mesh)   ??? mini-maze      corrected his A-fib   ??? rt eye  02/2018   ??? SEPTOPLASTY     ??? UVULOPALATOPHARYNGOPLASTY     ??? UVULOPALATOPHARYNGOPLASTY      has had esophageal dilations in the past (Dr.Decter)   ??? WISDOM TOOTH EXTRACTION       Family History   Problem Relation Age of Onset   ???  Diabetes Mother         type 2   ??? Hypertension Mother    ??? Coronary artery disease Sister    ??? Diabetes Sister         type 2   ??? Coronary artery disease Sister    ??? Coronary artery disease Brother    ??? Hypertension Brother    ??? Diabetes Brother         type 2   ??? Hypertension Brother      Social History     Tobacco Use   ??? Smoking status: Never Smoker   ??? Smokeless tobacco: Never Used   ??? Tobacco comment: 03/19/07   Substance Use Topics   ??? Alcohol use: No   ??? Drug use: No     Comment: 04-14-11     No Known Allergies  Current Facility-Administered Medications   Medication Dose Route Frequency Provider Last Rate Last Admin   ??? acetaminophen (TYLENOL) tablet 650 mg  650 mg Oral Q4H PRN Marissa Regan Rakers, DO       ??? apixaban (ELIQUIS) tablet 5 mg  5 mg Oral BID Truett Mainland, DO   5 mg at 11/09/19 2127   ??? aspirin EC tablet 81 mg  81 mg Oral Daily 0900 Marissa Regan Rakers, DO       ??? atorvastatin (LIPITOR) tablet 10 mg  10  mg Oral Nightly (2100) Marissa Regan Rakers, DO   10 mg at 11/09/19 2126   ??? carvediloL (COREG) tablet 25 mg  25 mg Oral BID Truett Mainland, DO   25 mg at 11/09/19 2126   ??? levothyroxine (SYNTHROID) tablet 100 mcg  100 mcg Oral QAM Marissa Regan Rakers, DO       ??? magnesium hydroxide (MILK OF MAGNESIA) 2,400 mg/10 mL oral suspension 10 mL  10 mL Oral Daily PRN Marissa Regan Rakers, DO       ??? ondansetron (ZOFRAN) tablet 4 mg  4 mg Oral Q8H PRN Marissa Regan Rakers, DO        Or   ??? ondansetron (ZOFRAN) injection 4 mg  4 mg Intravenous Q8H PRN Marissa Regan Rakers, DO       ??? pantoprazole (PROTONIX) EC tablet 40 mg  40 mg Oral DAILY 0600 Truett Mainland, DO   40 mg at 11/10/19 0501   ??? propafenone (RYTHMOL) immediate release tablet 225 mg  225 mg Oral TID Truett Mainland, DO   225 mg at 11/09/19 2130   ??? tamsulosin (FLOMAX) capsule 0.4 mg  0.4 mg Oral Nightly (2100) Truett Mainland, DO   0.4 mg at 11/09/19 2125   ??? valsartan (DIOVAN) tablet 160 mg  160 mg Oral Daily 0900 Truett Mainland, DO         REVIEW OF SYSTEMS:    GENERAL: Denies any weight change, fatigue, fever or weakness.   HEENT: Denies headache, visual changes or hearing changes.  No epistaxis or sore throat.  CARDIOVASCULAR: see HPI  RESPIRATORY: see HPI  SKIN: Denies any nodules, rash, itching, or hives.   GASTROINTESTINAL: Denies any nausea or vomiting. Denies diarrhea or constipation. No indigestion or dysphagia.  GENITOURINARY: Denies any dysuria, urgency, hematuria or increased frequency. MUSCULOSKELETAL: Denies any myalgias, joint stiffness or joint swelling. HEMATOLGICAL: Denies any bleeding or clotting tendencies. No blood transfusions. PSYCHIATRIC: Denies any anxiety or depression.   NEUROLOGICAL:  Denies any weakness or seizures. Denies any lightheadedness, dizziness, syncope or near syncope.  ENDOCRINE:  Denies  heat or cold intolerance. Denies polyuria or polydipsia.    Physical  Exam:  Patient Vitals for the past 4 hrs:   BP Temp Temp src Pulse Resp SpO2 Weight   11/10/19 0700 143/90 97.6 ??F (36.4 ??C) Oral 63 18 91 % --   11/10/19 0639 -- -- -- -- -- -- (!) 255 lb (115.7 kg)      Temp (24hrs), Avg:97.8 ??F (36.6 ??C), Min:97.3 ??F (36.3 ??C), Max:98.7 ??F (37.1 ??C)      Admission Weight:     Vitals:Blood pressure 143/90, pulse 63, temperature 97.6 ??F (36.4 ??C), temperature source Oral, resp. rate 18, height 6' (1.829 m), weight (!) 255 lb (115.7 kg), SpO2 91 %.    GENERAL: No acute distress. Well developed, well nourished.   HEENT: Normocephalic, atraumatic.  Mucous membranes moist. Extraocular   muscles intact. Pupils equally round bilaterally.  NECK: Supple. JVP appears elevated, no lymphadenopathy, no carotid bruit.  CARDIOVASCULAR: Regular rate and rhythm.  S1 and S2 are normal. No murmurs, rubs or gallops.  Point of maximal intensity nondisplaced and nonsustained. No hepatojugular reflux. Capillary refill less than 2 seconds  RESPIRATORY: No respiratory distress. Clear to auscultation bilaterally.  No rales/rhonchi/wheezes.    GASTROINTESTINAL:  No pulsatile masses.  Normal bowel sounds normal in all four quadrants.  Soft, nondistended, nontender.  No rebound or guarding.  EXTREMITIES: trace peripheral edema.  Palpable peripheral pulses. No cyanosis/clubbing.  NEURO: Cranial Nerve III through XII intact.  No Tremors. No focal deficit.  PSYCHIATRIC: Normal affect, insight, and speech. Alert and oriented x3 to person, place, and time.  SKIN: Intact. Normal skin turgor.  No rashes, no lesions, no erythema.     Date 11/09/19 0700 - 11/10/19 0659 11/10/19 0700 - 11/11/19 0659   Shift 0700-1459 1500-2259 2300-0659 24 Hour Total 0700-1459 1500-2259 2300-0659 24 Hour Total   INTAKE   P.O.  480  480         P.O.  480  480       Shift Total(mL/kg)  480(4.3)  480(4.1)       OUTPUT   Urine(mL/kg/hr)             Urine Occurrence 3 x 3 x  6 x       Emesis/NG output             Emesis Occurrence  0 x  0 x        Stool             Stool Occurrence  0 x  0 x       Shift Total(mL/kg)           Weight (kg) 112.8 112.8 115.7 115.7 115.7 115.7 115.7 115.7         Imaging:     Today's HQI:ONGEX rhythm with flat pwaves vs. junctional rhythm    Radiology: X-ray Chest PA and Lateral    Result Date: 11/09/2019  EXAM: XR CHEST PA AND LATERAL INDICATION: Dyspnea, unspecified,  dyspnea with exertion x 1 mth, hx of CHF, pls eval for acute CHF exacerbation, DATE: 11/09/2019 12:25 PM EDT COMPARISON: 03/07/2018 FINDINGS: The cardiac and mediastinal contours are within normal limits. There has been interval improvement in the previously seen bibasilar parenchymal opacities. Residual mild linear opacity likely represents residual linear scar. There is mild residual pleural thickening on the right. Pulmonary vascularity is within normal limits. The vascular markings remain distinct.     IMPRESSION: Interval improvement in the previously seen bibasilar  opacities. No evidence of pulmonary edema. Report Verified by: Norberta Keens, MD at 11/09/2019 1:24 PM EDT    Labs:   Lab Results   Component Value Date    TROPONINI <0.04 03/07/2018     Lab Results   Component Value Date    GLUCOSE 152 (H) 11/10/2019    BUN 22 11/10/2019    CO2 28 11/10/2019    CREATININE 1.20 11/10/2019    K 3.8 11/10/2019    NA 139 11/10/2019    CL 103 11/10/2019    CALCIUM 8.7 11/10/2019     No results found for: MG  Lab Results   Component Value Date    PHOS 3.6 05/12/2015     Lab Results   Component Value Date    WBC 10.1 11/10/2019    HGB 10.0 (L) 11/10/2019    HCT 30.6 (L) 11/10/2019    MCV 82.2 11/10/2019    PLT 146 11/10/2019     Lab Results   Component Value Date    INR 1.1 03/07/2018     Lab Results   Component Value Date    BNP 208 (H) 11/10/2019     Lab Results   Component Value Date    ALT 18 04/15/2008    AST 15 04/15/2008    ALKPHOS 83 04/15/2008    BILITOT 0.5 04/15/2008     Lab Results   Component Value Date    CHOLTOT 163 04/15/2008    TRIG 186 (H) 04/15/2008     HDL 31 (L) 04/15/2008    CHOLHDL 5.3 (H) 04/15/2008    LDL 95 04/15/2008     Lab Results   Component Value Date    HGBA1C 6.6 (H) 03/08/2018     Lab Results   Component Value Date    TSH 3.63 11/09/2019     No results found for: GLUFAST    Assessment and Plan:  Mr. 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     1. Exertional dyspnea. He appears volume up on exam today despite BNP which is only mildly elevated. 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 Plan:  - lasix 40 mg iv today, goal net negative 1-2 liters  - echocardiogram to evaluate for underlying structural heart disease along with pulmonary pressures  - blood pressure control as his blood pressure have been significantly elevated  - continue carvedilol 25 mg bid, valsartan 160 mg daily  - hydralazine 10 mg po q6h prn for SBP > 160 mmHg    2. Paroxysmal Aflutter s/p recent DCCV. Currently in sinus rhythm. CHADsVasc score 4 consistent with annual stroke risk of 4.8%. Plan:  - continue apixaban 5 mg po bid  - continue propafenone 225 mg tid    3. Nonobstructive CAD. Isolated episode of chest pain in the weeks prior. Cardiac enzymes are negative.   - pending echocardiogram results, consider ischemic evaluation if no other cause of dyspnea noted  - continue optimal medical therapy: aspirin 81 mg daily, atorvastatin 10 mg nightly, carvedilol and valsartan as above  - fasting lipid panel and A1C    Granville Lewis. Janalyn Shy, MD  11/10/2019  8:59 AM

## 2019-11-10 NOTE — Nursing Note (Signed)
[facility] 5PCT Nursing  Note    Patient Name: [name]    Admit Date:11/09/2019     Admission Diagnosis: Tachypnea [R06.82]  Leg swelling [M79.89]  Dyspnea on minimal exertion [R06.00]       [name] is alert and oriented. Patient denies any pain. Call light within reach. Bed in lowest position. Bed alarm activated. RN will continue plan of care.      Patient Vitals for the past 12 hrs:   BP Temp Temp src Pulse Resp SpO2 Weight   11/10/19 1437 140/72 97.8 F (36.6 C) Oral 68 18 94 % --   11/10/19 1047 157/77 98 F (36.7 C) Oral 66 18 94 % --   11/10/19 0700 143/90 97.6 F (36.4 C) Oral 63 18 91 % --   11/10/19 0639 -- -- -- -- -- -- (!) 255 lb (115.7 kg)         O2 Device: None (Room air)           O2 Flow Rate (L/min): 0 L/min       I/O last 3 completed shifts:  In: 480 [P.O.:480]  Out: -       I/O this shift:  In: 480 [P.O.:480]  Out: -        Family/Significant Other Communication: family at bedside, updated.           Jaclyn Prime, RN 11/10/2019 5:02 PM

## 2019-11-11 ENCOUNTER — Observation Stay: Admit: 2019-11-11 | Payer: Medicare (Managed Care)

## 2019-11-11 LAB — BASIC METABOLIC PANEL
Anion Gap: 7 mmol/L (ref 3–16)
Anion Gap: 7 mmol/L (ref 3–16)
BUN: 23 mg/dL (ref 7–25)
BUN: 23 mg/dL (ref 7–25)
CO2: 29 mmol/L (ref 21–33)
CO2: 29 mmol/L (ref 21–33)
Calcium: 8.8 mg/dL (ref 8.6–10.3)
Calcium: 8.8 mg/dL (ref 8.6–10.3)
Chloride: 102 mmol/L (ref 98–110)
Chloride: 102 mmol/L (ref 98–110)
Creatinine: 1.21 mg/dL (ref 0.60–1.30)
Creatinine: 1.21 mg/dL (ref 0.60–1.30)
GFR, Estimated: 57 See note.
GFR, Estimated: 66 See note.
Glucose: 199 mg/dL (ref 70–100)
Glucose: 199 mg/dL — ABNORMAL HIGH (ref 70–100)
Osmolality Calc: 295 mOsm/kg (ref 278–305)
Osmolality, Calculated: 295 mOsm/kg (ref 278–305)
Potassium: 3.9 mmol/L (ref 3.5–5.3)
Potassium: 3.9 mmol/L (ref 3.5–5.3)
Sodium: 138 mmol/L (ref 133–146)
Sodium: 138 mmol/L (ref 133–146)
eGFR AA CKD-EPI: 66 See note.
eGFR NONAA CKD-EPI: 57 See note.

## 2019-11-11 LAB — POCT GLUCOSE MONITORING DEVICE
Glucose: 262 mg/dL — ABNORMAL HIGH (ref 70–100)
Glucose: 228 mg/dL — ABNORMAL HIGH (ref 70–100)

## 2019-11-11 LAB — ECHOCARDIOGRAM 2D WO COLOR DOPPLER COMPLETE: Left Ventricular Ejection Fraction: 58

## 2019-11-11 LAB — POC GLU MONITORING DEVICE
POC Glucose Monitoring Device: 262 mg/dL (ref 70–100)
POC Glucose Monitoring Device: 228 mg/dL (ref 70–100)

## 2019-11-11 MED ORDER — insulin lispro (humaLOG) injection 0-5 Units
100 | Freq: Three times a day (TID) | SUBCUTANEOUS | Status: AC
Start: 2019-11-11 — End: 2019-11-14
  Administered 2019-11-12 (×2): 2 [IU] via SUBCUTANEOUS
  Administered 2019-11-12: 16:00:00 3 [IU] via SUBCUTANEOUS
  Administered 2019-11-12: 01:00:00 2 [IU] via SUBCUTANEOUS
  Administered 2019-11-13: 13:00:00 3 [IU] via SUBCUTANEOUS
  Administered 2019-11-13: 22:00:00 2 [IU] via SUBCUTANEOUS
  Administered 2019-11-13: 16:00:00 3 [IU] via SUBCUTANEOUS
  Administered 2019-11-14: 13:00:00 2 [IU] via SUBCUTANEOUS
  Administered 2019-11-14: 17:00:00 5 [IU] via SUBCUTANEOUS

## 2019-11-11 MED ORDER — glucose chewable tablet 12 g
4 | ORAL | Status: AC | PRN
Start: 2019-11-11 — End: 2019-11-14

## 2019-11-11 MED ORDER — furosemide (LASIX) injection 40 mg
10 | Freq: Once | INTRAMUSCULAR | Status: AC
Start: 2019-11-11 — End: 2019-11-11
  Administered 2019-11-11: 17:00:00 40 mg via INTRAVENOUS

## 2019-11-11 MED ORDER — dextrose 50 % in water (D50W) iv Syrg 25 mL
INTRAVENOUS | Status: AC | PRN
Start: 2019-11-11 — End: 2019-11-14

## 2019-11-11 MED ORDER — dextrose 50 % in water (D50W) iv Syrg 50 mL
INTRAVENOUS | Status: AC | PRN
Start: 2019-11-11 — End: 2019-11-14

## 2019-11-11 MED ORDER — perflutren (OPTISON) Susp 0.66 mg
0.22 | Freq: Once | INTRAVENOUS | Status: AC
Start: 2019-11-11 — End: 2019-11-11
  Administered 2019-11-11: 16:00:00 0.66 mg via INTRAVENOUS

## 2019-11-11 MED FILL — PROPAFENONE 225 MG TABLET: 225 225 MG | ORAL | Qty: 1

## 2019-11-11 MED FILL — FUROSEMIDE 10 MG/ML INJECTION SOLUTION: 10 10 mg/mL | INTRAMUSCULAR | Qty: 4

## 2019-11-11 MED FILL — ELIQUIS 5 MG TABLET: 5 5 mg | ORAL | Qty: 1

## 2019-11-11 MED FILL — OPTISON 0.22 MG/ML INTRAVENOUS SUSPENSION: 0.22 0.22 mg/mL | INTRAVENOUS | Qty: 3

## 2019-11-11 MED FILL — HUMALOG U-100 INSULIN 100 UNIT/ML SUBCUTANEOUS SOLUTION: 100 100 unit/mL | SUBCUTANEOUS | Qty: 3

## 2019-11-11 MED FILL — CARVEDILOL 12.5 MG TABLET: 12.5 12.5 MG | ORAL | Qty: 2

## 2019-11-11 MED FILL — SYNTHROID 100 MCG TABLET: 100 100 mcg | ORAL | Qty: 1

## 2019-11-11 MED FILL — ASPIRIN 81 MG TABLET,DELAYED RELEASE: 81 81 MG | ORAL | Qty: 1

## 2019-11-11 MED FILL — PANTOPRAZOLE 40 MG TABLET,DELAYED RELEASE: 40 40 MG | ORAL | Qty: 1

## 2019-11-11 MED FILL — VALSARTAN 80 MG TABLET: 80 80 MG | ORAL | Qty: 2

## 2019-11-11 MED FILL — TAMSULOSIN 0.4 MG CAPSULE: 0.4 0.4 mg | ORAL | Qty: 1

## 2019-11-11 NOTE — Unmapped (Signed)
Problem: Knowledge Deficit  Goal: Patient/family/caregiver demonstrates understanding of disease process, treatment plan, medications, and discharge instructions  Description: Complete learning assessment and assess knowledge base.  Outcome: Progressing     Problem: Fall Prevention  Goal: Patient will remain free of falls  Description: Assess and monitor vitals signs, neurological status including level of consciousness and orientation.  Reassess fall risk per hospital policy.    Ensure arm band on, uncluttered walking paths in room, adequate room lighting, call light and overbed table within reach, bed in low position, wheels locked, side rails up per policy (excluding SNF), and non-skid footwear provided.   Outcome: Progressing

## 2019-11-11 NOTE — Unmapped (Signed)
Problem: Knowledge deficit related to self-management of chronic disease  Goal: Patient/family/caregiver demonstrates understanding of disease process, treatment plan, medications, and discharge instructions  Outcome: Progressing     Problem: Potential for imbalanced nutrition related to metabolic effect of diabetes  Goal: Patient's nutritional needs will be met  Outcome: Progressing     Problem: Risk for ineffective therapeutic regimen management related to insulin pump  Goal: Blood glucose is within target range  Outcome: Progressing

## 2019-11-11 NOTE — Progress Notes (Signed)
[facility]  Case Management/Social Work Department  Progress Note    Patient Information     Hospital day: 0  Inpatient/Observation:  Observation   Admit date:  11/09/2019  Admission diagnosis: Tachypnea [R06.82]  Leg swelling [M79.89]  Dyspnea on minimal exertion [R06.00]    Other Pertinent Information     CM continues to follow patient for d/c planning needs.  Patient continues treatment for CHF- cardiology consulted. On IV lasix, ECHO today and adjusting meds/monitoiring labs.  Patient from home independent at baseline and plans to return home with no needs.  CM to continue to follow and watch for changes.  Family will transport home at time of d/c.    Discharge Plan     Anticipated discharge plan:  Home    Discharge barriers: Cardiology consult recs- pending ECHO and on IV lasix     Anticipated discharge date:   TBD      CM/SW will continue to follow and remain available until hospital discharge for discharge planning needs.      Charlott Rakes, RN CM    [phone]

## 2019-11-11 NOTE — Unmapped (Signed)
[facility] PROGRESS NOTE:    Admit Date: 11/09/2019     Visit Date: 11/11/2019  Admit class: Observation      [id number]    Subjective:       Interval History: again feeling better after Lasix. Good UOP. No CP. SOB improving. Walking around room well. Just got back from echo.     ROS: No CP/SOB, No N/V, No fever/chills, No HA    Physical Exam:    BP 147/79 (BP Location: Left arm, Patient Position: Sitting)    Pulse 51    Temp 97.7 ??F (36.5 ??C) (Oral)    Resp 16    Ht 6' (1.829 m)    Wt (!) 255 lb (115.7 kg)    SpO2 94%    BMI 34.58 kg/m??   General appearance: alert, appears stated age and cooperative  Lungs: clear to auscultation bilaterally and no respiratory distress  Heart: regular rate and rhythm, S1, S2 normal, no murmur, click, rub or gallop  Abdomen: soft, non-tender; bowel sounds normal; no masses,  no organomegaly  Extremities: +edema  Skin: Skin color, texture, turgor normal. No rashes or lesions  Neurologic: Grossly normal     No results for input(s): POCGLU in the last 72 hours.    Intake/Output Summary (Last 24 hours) at 11/11/2019 1250  Last data filed at 11/11/2019 0900  Gross per 24 hour   Intake 780 ml   Output --   Net 780 ml       Labs:      Recent Labs     11/09/19  1226 11/10/19  0539   WBC 8.7 10.1   HGB 10.4* 10.0*   HCT 31.8* 30.6*   PLT 143 146                                                                  Recent Labs     11/09/19  1226 11/10/19  0539 11/11/19  0525   NA 138 139 138   K 4.5 3.8 3.9   CL 104 103 102   CO2 26 28 29    BUN 22 22 23    CREATININE 1.21 1.20 1.21   GLUCOSE 159* 152* 199*     No results for input(s): INR in the last 72 hours.    ??? apixaban  5 mg Oral BID   ??? aspirin  81 mg Oral Daily 0900   ??? atorvastatin  10 mg Oral Nightly (2100)   ??? carvediloL  25 mg Oral BID   ??? furosemide (LASIX) injection  40 mg Intravenous Once   ??? levothyroxine  100 mcg Oral QAM   ??? pantoprazole  40 mg Oral DAILY 0600   ??? perflutren  0.66 mg Intravenous Once   ??? propafenone  225 mg Oral TID   ???  tamsulosin  0.4 mg Oral Nightly (2100)   ??? valsartan  160 mg Oral Daily 0900       Assessment & Plan:    ASSESSMENT  1. DOE  2. CHF exacerbation  3. H/o Afib, s/p cardioversion the day PTA , ? back in Afib in ER  4. H/o A-flutter s/p ablation and mini-Maze procedure  5. DM  6. Dilated CM  7. HTN, uncontrolled  8. OSA, home CPAP  9. Hypothyroid   ??  PLAN  Tele  Lasix IV  Monitor I/O, daily wts  Consulted cardiology, apprec help  Echo tomorrow  PRN HDLZ for elevated BPs  Cont home Eliquis  Labs in am, monitor SCr      Disposition: OBS    Earline Mayotte, D.O.

## 2019-11-11 NOTE — Unmapped (Signed)
[facility] Daily Progress Note    Subjective:  [name] is doing well today. He is feeling much better after IV lasix. No acute issues overnight.     1. Dyspnea on minimal exertion    2. Leg swelling    3. Tachypnea    4. Acute congestive heart failure, unspecified heart failure type (CMS Dx)    5. Essential hypertension    6. Atypical atrial flutter (CMS Dx)        Past Medical History:   Diagnosis Date   ??? Atrial fibrillation (CMS Dx)     corrected with Mini-maze procedure   ??? Cataract     left eye   ??? Diabetes mellitus (CMS Dx)     type 2   ??? Dilated cardiomyopathy (CMS Dx)    ??? Hearing loss     left ear   ??? Hypercholesteremia    ??? Hypertension    ??? Sleep apnea    ??? Thyroid disease     hypothyroidism       Current Facility-Administered Medications   Medication Dose Route Frequency Provider Last Rate Last Admin   ??? acetaminophen (TYLENOL) tablet 650 mg  650 mg Oral Q4H PRN Marissa Regan Rakers, DO       ??? apixaban (ELIQUIS) tablet 5 mg  5 mg Oral BID Truett Mainland, DO   5 mg at 11/11/19 0940   ??? aspirin EC tablet 81 mg  81 mg Oral Daily 0900 Truett Mainland, DO   81 mg at 11/11/19 0940   ??? atorvastatin (LIPITOR) tablet 10 mg  10 mg Oral Nightly (2100) Marissa Regan Rakers, DO   10 mg at 11/10/19 2007   ??? carvediloL (COREG) tablet 25 mg  25 mg Oral BID Truett Mainland, DO   25 mg at 11/11/19 0940   ??? hydrALAZINE (APRESOLINE) tablet 10 mg  10 mg Oral Q6H PRN Mordecai Rasmussen., MD       ??? levothyroxine (SYNTHROID) tablet 100 mcg  100 mcg Oral QAM Truett Mainland, DO   100 mcg at 11/11/19 0940   ??? magnesium hydroxide (MILK OF MAGNESIA) 2,400 mg/10 mL oral suspension 10 mL  10 mL Oral Daily PRN Truett Mainland, DO       ??? ondansetron (ZOFRAN) tablet 4 mg  4 mg Oral Q8H PRN Marissa Regan Rakers, DO        Or   ??? ondansetron (ZOFRAN) injection 4 mg  4 mg Intravenous Q8H PRN Marissa Regan Rakers, DO       ??? pantoprazole (PROTONIX) EC tablet 40 mg  40  mg Oral DAILY 0600 Truett Mainland, DO   40 mg at 11/11/19 3235   ??? perflutren (OPTISON) Susp 0.66 mg  0.66 mg Intravenous Once Marissa Regan Rakers, DO       ??? propafenone (RYTHMOL) immediate release tablet 225 mg  225 mg Oral TID Truett Mainland, DO   225 mg at 11/11/19 0940   ??? tamsulosin (FLOMAX) capsule 0.4 mg  0.4 mg Oral Nightly (2100) Truett Mainland, DO   0.4 mg at 11/10/19 2008   ??? valsartan (DIOVAN) tablet 160 mg  160 mg Oral Daily 0900 Truett Mainland, DO   160 mg at 11/11/19 0940     No Known Allergies  Active Problems:    * No active hospital problems. *        Objective:  Physical Exam:  Patient Vitals for the past 4 hrs:  BP Temp Temp src Pulse Resp SpO2   11/11/19 0853 152/80 97 ??F (36.1 ??C) Oral 105 16 95 %      Temp (24hrs), Avg:97.6 ??F (36.4 ??C), Min:97 ??F (36.1 ??C), Max:97.9 ??F (36.6 ??C)       Vitals:Blood pressure 152/80, pulse 105, temperature 97 ??F (36.1 ??C), temperature source Oral, resp. rate 16, height 6' (1.829 m), weight (!) 255 lb (115.7 kg), SpO2 95 %.      GENERAL: No acute distress. Well developed, well nourished.     HEENT: Normocephalic, atraumatic.  Mucous membranes moist. Extraocular   muscles intact. Pupils equally round bilaterally.    NECK: Supple.  No jugular venous distention, no lymphadenopathy, no carotid bruit.    CARDIOVASCULAR: Regular rate and rhythm.  S1 and S2 are normal. No murmurs, rubs or gallops.  Point of maximal intensity nondisplaced and nonsustained. No hepatojugular reflux. Capillary refill less than 2 seconds    RESPIRATORY: No respiratory distress. Diminished to auscultation bilaterally.  No rales/rhonchi/wheezes.      GASTROINTESTINAL:  No pulsatile masses.  Normal bowel sounds normal in all four quadrants.  Soft, nondistended, nontender.  No rebound or guarding.    EXTREMITIES: trace to 1+ peripheral edema.  Palpable peripheral pulses. No cyanosis/clubbing.    NEURO: Cranial Nerve III through XII intact.  No  Tremors. No focal deficit.    PSYCHIATRIC: Normal affect, insight, and speech. Alert and oriented x3 to person, place, and time.    SKIN: Intact. Normal skin turgor.  No rashes, no lesions, no erythema.     Date 11/10/19 0700 - 11/11/19 0659 11/11/19 0700 - 11/12/19 0659   Shift 0700-1459 1500-2259 2300-0659 24 Hour Total 0700-1459 1500-2259 2300-0659 24 Hour Total   INTAKE   P.O. 480 240  720 300   300     P.O. 480 240  720 300   300   Shift Total(mL/kg) 480(4.1) 240(2.1)  720(6.2) 300(2.6)   300(2.6)   OUTPUT   Urine(mL/kg/hr)             Urine Occurrence 7 x  3 x 10 x 1 x   1 x   Emesis/NG output             Emesis Occurrence   0 x 0 x       Stool             Stool Occurrence  0 x 0 x 0 x 1 x   1 x   Shift Total(mL/kg)           Weight (kg) 115.7 115.7 115.7 115.7 115.7 115.7 115.7 115.7       Cardiographics  ECG:  Echocardiogram:     Imaging  Radiology: X-ray Chest PA and Lateral    Result Date: 11/09/2019  EXAM: XR CHEST PA AND LATERAL INDICATION: Dyspnea, unspecified,  dyspnea with exertion x 1 mth, hx of CHF, pls eval for acute CHF exacerbation, DATE: 11/09/2019 12:25 PM EDT COMPARISON: 03/07/2018 FINDINGS: The cardiac and mediastinal contours are within normal limits. There has been interval improvement in the previously seen bibasilar parenchymal opacities. Residual mild linear opacity likely represents residual linear scar. There is mild residual pleural thickening on the right. Pulmonary vascularity is within normal limits. The vascular markings remain distinct.     IMPRESSION: Interval improvement in the previously seen bibasilar opacities. No evidence of pulmonary edema. Report Verified by: Norberta Keens, MD at 11/09/2019 1:24 PM EDT      Lab Review  Lab Results   Component Value Date    TROPONINI <0.04 03/07/2018     Lab Results   Component Value Date    GLUCOSE 199 (H) 11/11/2019    BUN 23 11/11/2019    CO2 29 11/11/2019    CREATININE 1.21 11/11/2019    K 3.9 11/11/2019    NA 138 11/11/2019    CL 102  11/11/2019    CALCIUM 8.8 11/11/2019     Lab Results   Component Value Date    PHOS 3.6 05/12/2015     Lab Results   Component Value Date    WBC 10.1 11/10/2019    HGB 10.0 (L) 11/10/2019    HCT 30.6 (L) 11/10/2019    MCV 82.2 11/10/2019    PLT 146 11/10/2019     Lab Results   Component Value Date    INR 1.1 03/07/2018     Lab Results   Component Value Date    BNP 208 (H) 11/10/2019     Lab Results   Component Value Date    ALT 18 04/15/2008    AST 15 04/15/2008    ALKPHOS 83 04/15/2008    BILITOT 0.5 04/15/2008     Lab Results   Component Value Date    CHOLTOT 163 04/15/2008    TRIG 186 (H) 04/15/2008    HDL 31 (L) 04/15/2008    CHOLHDL 5.3 (H) 04/15/2008    LDL 95 04/15/2008     Lab Results   Component Value Date    HGBA1C 6.6 (H) 03/08/2018     Lab Results   Component Value Date    TSH 3.63 11/09/2019           Assessment/Plan:  [name] is a 79 yo gentleman with history of nonobstructive CAD, atypical flutter s/p DCCV (11/08/2019), right atrial flutter s/p PVI and ganglionic plexus ablation, 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   ??  1. Exertional dyspnea. He appears volume up on exam today despite BNP which is only mildly elevated. 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 Plan:  - lasix 40 mg iv today, goal net negative 1-2 liters  - echocardiogram to evaluate for underlying structural heart disease along with pulmonary pressures  - continue carvedilol 25 mg bid, valsartan 160 mg daily  - hydralazine 10 mg po q6h prn for SBP > 160 mmHg  ??  2. Paroxysmal Aflutter/fibrillation s/p recent DCCV. Currently in sinus rhythm. CHADsVasc score 4 consistent with annual stroke risk of 4.8%. Plan:  - continue apixaban 5 mg po bid  - continue propafenone 225 mg tid  ??  3. Nonobstructive CAD. Isolated episode of chest pain in the weeks prior. Cardiac enzymes are  negative.   - pending echocardiogram results  - continue optimal medical therapy: aspirin 81 mg daily, atorvastatin 10 mg nightly, carvedilol and valsartan as above  - fasting lipid panel and A1C    4. HTN: not adequately controlled this admission. Improved from yesterday.     - continue with diuresis  - can make BP med adjustments when euvolemic    ??    Thank you for including [place] in the patient's care. We will continue to follow along with this patient. Please call with any questions.       Arva Chafe CNP  [facility]   [phone]  11/11/2019

## 2019-11-11 NOTE — Unmapped (Signed)
[facility] 5PCT Nursing  Note    Patient Name: [name]    Admit Date:11/09/2019     Admission Diagnosis: Tachypnea [R06.82]  Leg swelling [M79.89]  Dyspnea on minimal exertion [R06.00]       [name] is alert and oriented. Patient denies pain. Call light within reach. Bed in lowest position. Bed alarm activated. RN will continue plan of care.      Patient Vitals for the past 12 hrs:   BP Temp Temp src Pulse Resp SpO2   11/11/19 0339 149/76 97.7 ??F (36.5 ??C) Oral 99 18 96 %   11/10/19 2250 156/66 97.9 ??F (36.6 ??C) Oral 104 18 94 %   11/10/19 2004 175/88 97.7 ??F (36.5 ??C) Oral 76 18 95 %         O2 Device: None (Room air)           O2 Flow Rate (L/min): 0 L/min       I/O last 3 completed shifts:  In: 960 [P.O.:960]  Out: -       I/O this shift:  In: 240 [P.O.:240]  Out: -        Family/Significant Other Communication: N/A      No acute events this shift     Jesusita Oka, RN 11/11/2019 5:21 AM

## 2019-11-12 LAB — BASIC METABOLIC PANEL
Anion Gap: 5 mmol/L (ref 3–16)
Anion Gap: 5 mmol/L (ref 3–16)
BUN: 21 mg/dL (ref 7–25)
BUN: 21 mg/dL (ref 7–25)
CO2: 32 mmol/L (ref 21–33)
CO2: 32 mmol/L (ref 21–33)
Calcium: 9.2 mg/dL (ref 8.6–10.3)
Calcium: 9.2 mg/dL (ref 8.6–10.3)
Chloride: 102 mmol/L (ref 98–110)
Chloride: 102 mmol/L (ref 98–110)
Creatinine: 1.17 mg/dL (ref 0.60–1.30)
Creatinine: 1.17 mg/dL (ref 0.60–1.30)
GFR, Estimated: 59 See note.
GFR, Estimated: 69 See note.
Glucose: 200 mg/dL (ref 70–100)
Glucose: 200 mg/dL — ABNORMAL HIGH (ref 70–100)
Osmolality Calc: 297 mOsm/kg (ref 278–305)
Osmolality, Calculated: 297 mOsm/kg (ref 278–305)
Potassium: 4.4 mmol/L (ref 3.5–5.3)
Potassium: 4.4 mmol/L (ref 3.5–5.3)
Sodium: 139 mmol/L (ref 133–146)
Sodium: 139 mmol/L (ref 133–146)
eGFR AA CKD-EPI: 69 See note.
eGFR NONAA CKD-EPI: 59 See note.

## 2019-11-12 LAB — POCT GLUCOSE MONITORING DEVICE
Glucose: 245 mg/dL — ABNORMAL HIGH (ref 70–100)
Glucose: 188 mg/dL — ABNORMAL HIGH (ref 70–100)
Glucose: 209 mg/dL — ABNORMAL HIGH (ref 70–100)
Glucose: 277 mg/dL — ABNORMAL HIGH (ref 70–100)

## 2019-11-12 LAB — HEMOGLOBIN A1C
Hemoglobin A1C: 6.9 % (ref 4.0–5.6)
Hemoglobin A1C: 6.9 % — ABNORMAL HIGH (ref 4.0–5.6)

## 2019-11-12 LAB — POC GLU MONITORING DEVICE
POC Glucose Monitoring Device: 245 mg/dL (ref 70–100)
POC Glucose Monitoring Device: 188 mg/dL (ref 70–100)
POC Glucose Monitoring Device: 209 mg/dL (ref 70–100)
POC Glucose Monitoring Device: 277 mg/dL (ref 70–100)
POC Glucose Monitoring Device: 221 mg/dL (ref 70–100)

## 2019-11-12 MED ORDER — valsartan (DIOVAN) tablet 160 mg
160 | Freq: Once | ORAL | Status: AC
Start: 2019-11-12 — End: 2019-11-12
  Administered 2019-11-12: 15:00:00 160 mg via ORAL

## 2019-11-12 MED ORDER — valsartan (DIOVAN) tablet 320 mg
160 | Freq: Every day | ORAL | Status: AC
Start: 2019-11-12 — End: 2019-11-14
  Administered 2019-11-13 – 2019-11-14 (×2): 320 mg via ORAL

## 2019-11-12 MED FILL — ASPIRIN 81 MG TABLET,DELAYED RELEASE: 81 81 MG | ORAL | Qty: 1

## 2019-11-12 MED FILL — CARVEDILOL 12.5 MG TABLET: 12.5 12.5 MG | ORAL | Qty: 2

## 2019-11-12 MED FILL — ELIQUIS 5 MG TABLET: 5 5 mg | ORAL | Qty: 1

## 2019-11-12 MED FILL — ATORVASTATIN 10 MG TABLET: 10 10 MG | ORAL | Qty: 1

## 2019-11-12 MED FILL — PROPAFENONE 225 MG TABLET: 225 225 MG | ORAL | Qty: 1

## 2019-11-12 MED FILL — HYDRALAZINE 10 MG TABLET: 10 10 MG | ORAL | Qty: 1

## 2019-11-12 MED FILL — VALSARTAN 80 MG TABLET: 80 80 MG | ORAL | Qty: 2

## 2019-11-12 MED FILL — VALSARTAN 160 MG TABLET: 160 160 MG | ORAL | Qty: 1

## 2019-11-12 MED FILL — TAMSULOSIN 0.4 MG CAPSULE: 0.4 0.4 mg | ORAL | Qty: 1

## 2019-11-12 MED FILL — PANTOPRAZOLE 40 MG TABLET,DELAYED RELEASE: 40 40 MG | ORAL | Qty: 1

## 2019-11-12 MED FILL — SYNTHROID 100 MCG TABLET: 100 100 mcg | ORAL | Qty: 1

## 2019-11-12 NOTE — Unmapped (Signed)
[facility] PROGRESS NOTE:    Admit Date: 11/09/2019     Visit Date: 11/12/2019  Admit class: Observation      [id number]/[id number]    Subjective:       Interval History: again feeling better after Lasix. Good UOP. No CP. SOB improving. No pain.     ROS: No CP/SOB, No N/V, No fever/chills, No HA    Physical Exam:    BP 156/77 (BP Location: Left arm, Patient Position: Lying)    Pulse 55    Temp 97.5 ??F (36.4 ??C) (Oral)    Resp 18    Ht 6' (1.829 m)    Wt (!) 246 lb 5 oz (111.7 kg)    SpO2 96%    BMI 33.41 kg/m??   General appearance: alert, appears stated age and cooperative  Lungs: clear to auscultation bilaterally and no respiratory distress  Heart: regular rate and rhythm, S1, S2 normal, no murmur, click, rub or gallop  Abdomen: soft, non-tender; bowel sounds normal; no masses,  no organomegaly  Extremities: +edema  Skin: Skin color, texture, turgor normal. No rashes or lesions  Neurologic: Grossly normal     No results for input(s): POCGLU in the last 72 hours.    Intake/Output Summary (Last 24 hours) at 11/12/2019 1408  Last data filed at 11/12/2019 0800  Gross per 24 hour   Intake 1220 ml   Output 1449 ml   Net -229 ml       Labs:      Recent Labs     11/10/19  0539   WBC 10.1   HGB 10.0*   HCT 30.6*   PLT 146                                                                  Recent Labs     11/10/19  0539 11/11/19  0525 11/12/19  0537   NA 139 138 139   K 3.8 3.9 4.4   CL 103 102 102   CO2 28 29 32   BUN 22 23 21    CREATININE 1.20 1.21 1.17   GLUCOSE 152* 199* 200*     No results for input(s): INR in the last 72 hours.    ??? apixaban  5 mg Oral BID   ??? aspirin  81 mg Oral Daily 0900   ??? atorvastatin  10 mg Oral Nightly (2100)   ??? carvediloL  25 mg Oral BID   ??? insulin lispro  0-5 Units Subcutaneous TID AC   ??? levothyroxine  100 mcg Oral QAM   ??? pantoprazole  40 mg Oral DAILY 0600   ??? propafenone  225 mg Oral TID   ??? tamsulosin  0.4 mg Oral Nightly (2100)   ??? [START ON 11/13/2019] valsartan  320 mg Oral Daily 0900       Assessment &  Plan:    ASSESSMENT  1. DOE  2. CHF exacerbation  3. H/o Afib, s/p cardioversion the day PTA , ? back in Afib in ER  4. H/o A-flutter s/p ablation and mini-Maze procedure  5. DM  6. Dilated CM  7. HTN, uncontrolled  8. OSA, home CPAP  9. Hypothyroid   ??  PLAN  Tele  Lasix IV  Monitor I/O, daily  wts  Consulted cardiology, apprec help  Echo nl EF, +LVH  PRN HDLZ for elevated BPs  Cont home Eliquis  Labs in am, monitor SCr      Disposition: OBS    Earline Mayotte, D.O.

## 2019-11-12 NOTE — Unmapped (Signed)
Problem: Glucose Imbalance related to diabetes disease process  Goal: Clinical indication of glucose balance is achieved  Outcome: Progressing     Problem: Potential for imbalanced nutrition related to metabolic effect of diabetes  Goal: Patient's nutritional needs will be met  Outcome: Progressing     Problem: Fall Prevention  Goal: Patient will remain free of falls  Description: Assess and monitor vitals signs, neurological status including level of consciousness and orientation.  Reassess fall risk per hospital policy.    Ensure arm band on, uncluttered walking paths in room, adequate room lighting, call light and overbed table within reach, bed in low position, wheels locked, side rails up per policy (excluding SNF), and non-skid footwear provided.   Outcome: Progressing

## 2019-11-12 NOTE — Unmapped (Signed)
Patient remains alert and oriented x4. Patient had no complaints of pain. Patient vitals stable. On room air. PRN Hydralazine given x1 thus far for SBP >160. RN medicated per MAR. Patient resting comfortably in bed, bed in lowest position, call light within reach. Patient needs assessed with hourly rounding. Will continue to monitor.

## 2019-11-12 NOTE — Unmapped (Addendum)
[facility] Daily Progress Note    Subjective:  Mr. Abdallah is doing well today. He is feeling much better after IV lasix. No acute issues overnight. I discussed the plan of care with him and answered all questions.       1. Dyspnea on minimal exertion    2. Leg swelling    3. Tachypnea    4. Acute congestive heart failure, unspecified heart failure type (CMS Dx)    5. Essential hypertension    6. Atypical atrial flutter (CMS Dx)        Past Medical History:   Diagnosis Date   ??? Atrial fibrillation (CMS Dx)     corrected with Mini-maze procedure   ??? Cataract     left eye   ??? Diabetes mellitus (CMS Dx)     type 2   ??? Dilated cardiomyopathy (CMS Dx)    ??? Hearing loss     left ear   ??? Hypercholesteremia    ??? Hypertension    ??? Sleep apnea    ??? Thyroid disease     hypothyroidism       Current Facility-Administered Medications   Medication Dose Route Frequency Provider Last Rate Last Admin   ??? acetaminophen (TYLENOL) tablet 650 mg  650 mg Oral Q4H PRN Marissa Regan Rakers, DO       ??? apixaban (ELIQUIS) tablet 5 mg  5 mg Oral BID Truett Mainland, DO   5 mg at 11/12/19 0755   ??? aspirin EC tablet 81 mg  81 mg Oral Daily 0900 Truett Mainland, DO   81 mg at 11/12/19 1610   ??? atorvastatin (LIPITOR) tablet 10 mg  10 mg Oral Nightly (2100) Marissa Regan Rakers, DO   10 mg at 11/11/19 2105   ??? carvediloL (COREG) tablet 25 mg  25 mg Oral BID Truett Mainland, DO   25 mg at 11/12/19 0755   ??? dextrose 50 % in water (D50W) iv Syrg 25 mL  25 mL Intravenous Q15 Min PRN Marissa Regan Rakers, DO        Or   ??? dextrose 50 % in water (D50W) iv Syrg 50 mL  50 mL Intravenous Q15 Min PRN Marissa Regan Rakers, DO       ??? glucose chewable tablet 12 g  12 g Oral Q15 Min PRN Marissa Regan Rakers, DO       ??? hydrALAZINE (APRESOLINE) tablet 10 mg  10 mg Oral Q6H PRN Mordecai Rasmussen., MD   10 mg at 11/12/19 0756   ??? insulin lispro (humaLOG) injection 0-5 Units  0-5 Units Subcutaneous TID AC Marissa  Regan Rakers, DO   2 Units at 11/12/19 0756   ??? levothyroxine (SYNTHROID) tablet 100 mcg  100 mcg Oral QAM Truett Mainland, DO   100 mcg at 11/12/19 0755   ??? magnesium hydroxide (MILK OF MAGNESIA) 2,400 mg/10 mL oral suspension 10 mL  10 mL Oral Daily PRN Truett Mainland, DO       ??? ondansetron (ZOFRAN) tablet 4 mg  4 mg Oral Q8H PRN Marissa Regan Rakers, DO        Or   ??? ondansetron (ZOFRAN) injection 4 mg  4 mg Intravenous Q8H PRN Marissa Regan Rakers, DO       ??? pantoprazole (PROTONIX) EC tablet 40 mg  40 mg Oral DAILY 0600 Truett Mainland, DO   40 mg at 11/12/19 0531   ??? propafenone (RYTHMOL) immediate release tablet 225  mg  225 mg Oral TID Truett Mainland, DO   225 mg at 11/12/19 0755   ??? tamsulosin (FLOMAX) capsule 0.4 mg  0.4 mg Oral Nightly (2100) Marissa Regan Rakers, DO   0.4 mg at 11/11/19 2104   ??? [START ON 11/13/2019] valsartan (DIOVAN) tablet 320 mg  320 mg Oral Daily 0900 Hong Moring Emanuel Charlette Hennings, CNP         No Known Allergies  Active Problems:    * No active hospital problems. *        Objective:  Physical Exam:  Patient Vitals for the past 4 hrs:   BP Temp Temp src Pulse Resp SpO2   11/12/19 1022 156/77 97.5 ??F (36.4 ??C) Oral 55 18 96 %      Temp (24hrs), Avg:97.6 ??F (36.4 ??C), Min:97.5 ??F (36.4 ??C), Max:97.7 ??F (36.5 ??C)       Vitals:Blood pressure 156/77, pulse 55, temperature 97.5 ??F (36.4 ??C), temperature source Oral, resp. rate 18, height 6' (1.829 m), weight (!) 246 lb 5 oz (111.7 kg), SpO2 96 %.      GENERAL: No acute distress. Well developed, well nourished.     HEENT: Normocephalic, atraumatic.  Mucous membranes moist. Extraocular   muscles intact. Pupils equally round bilaterally.    NECK: Supple.  No jugular venous distention, no lymphadenopathy, no carotid bruit.    CARDIOVASCULAR: Regular rate and rhythm.  S1 and S2 are normal. No murmurs, rubs or gallops.  Point of maximal intensity nondisplaced and nonsustained. No hepatojugular reflux.  Capillary refill less than 2 seconds    RESPIRATORY: No respiratory distress. Diminished to auscultation bilaterally.  No rales/rhonchi/wheezes.      GASTROINTESTINAL:  No pulsatile masses.  Normal bowel sounds normal in all four quadrants.  Soft, nondistended, nontender.  No rebound or guarding.    EXTREMITIES: trace peripheral edema.  Palpable peripheral pulses. No cyanosis/clubbing.    NEURO: Cranial Nerve III through XII intact.  No Tremors. No focal deficit.    PSYCHIATRIC: Normal affect, insight, and speech. Alert and oriented x3 to person, place, and time.    SKIN: Intact. Normal skin turgor.  No rashes, no lesions, no erythema.     Date 11/11/19 0700 - 11/12/19 0659 11/12/19 0700 - 11/13/19 0659   Shift 0700-1459 1500-2259 2300-0659 24 Hour Total 0700-1459 1500-2259 2300-0659 24 Hour Total   INTAKE   P.O. 300 [phone] 120   120     P.O. 300 [phone] 120   120   Shift Total(mL/kg) 300(2.6) 600(5.4) 500(4.5) 1400(12.5) 120(1.1)   120(1.1)   OUTPUT   Urine(mL/kg/hr)  999(1.1) 450(0.5) 1449(0.5)         Urine  [phone]         Urine Occurrence 1 x 3 x  4 x       Stool             Stool Occurrence 1 x 0 x  1 x       Shift Total(mL/kg)  999(9) 450(4) 1449(13)       Weight (kg) 115.7 111.6 111.7 111.7 111.7 111.7 111.7 111.7       Cardiographics  ECG:  Echocardiogram:     Imaging  Radiology: X-ray Chest PA and Lateral    Result Date: 11/09/2019  EXAM: XR CHEST PA AND LATERAL INDICATION: Dyspnea, unspecified,  dyspnea with exertion x 1 mth, hx of CHF, pls eval for acute CHF exacerbation, DATE: 11/09/2019 12:25 PM EDT COMPARISON: 03/07/2018  FINDINGS: The cardiac and mediastinal contours are within normal limits. There has been interval improvement in the previously seen bibasilar parenchymal opacities. Residual mild linear opacity likely represents residual linear scar. There is mild residual pleural thickening on the right. Pulmonary vascularity is within normal limits. The vascular markings remain  distinct.     IMPRESSION: Interval improvement in the previously seen bibasilar opacities. No evidence of pulmonary edema. Report Verified by: Norberta Keens, MD at 11/09/2019 1:24 PM EDT      Lab Review   Lab Results   Component Value Date    TROPONINI <0.04 03/07/2018     Lab Results   Component Value Date    GLUCOSE 200 (H) 11/12/2019    BUN 21 11/12/2019    CO2 32 11/12/2019    CREATININE 1.17 11/12/2019    K 4.4 11/12/2019    NA 139 11/12/2019    CL 102 11/12/2019    CALCIUM 9.2 11/12/2019     Lab Results   Component Value Date    PHOS 3.6 05/12/2015     Lab Results   Component Value Date    WBC 10.1 11/10/2019    HGB 10.0 (L) 11/10/2019    HCT 30.6 (L) 11/10/2019    MCV 82.2 11/10/2019    PLT 146 11/10/2019     Lab Results   Component Value Date    INR 1.1 03/07/2018     Lab Results   Component Value Date    BNP 208 (H) 11/10/2019     Lab Results   Component Value Date    ALT 18 04/15/2008    AST 15 04/15/2008    ALKPHOS 83 04/15/2008    BILITOT 0.5 04/15/2008     Lab Results   Component Value Date    CHOLTOT 163 04/15/2008    TRIG 186 (H) 04/15/2008    HDL 31 (L) 04/15/2008    CHOLHDL 5.3 (H) 04/15/2008    LDL 95 04/15/2008     Lab Results   Component Value Date    HGBA1C 6.6 (H) 03/08/2018     Lab Results   Component Value Date    TSH 3.63 11/09/2019           Assessment/Plan:  Mr. 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   ??  1. Exertional dyspnea. He appears volume up on exam today despite BNP which is only mildly elevated. 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 Plan:  - lasix 40 mg iv today, goal net negative 1-2 liters  - echocardiogram with normal LVEF  - continue carvedilol 25 mg bid,  valsartan 160 mg daily  - hydralazine 10 mg po q6h prn for SBP > 160 mmHg  ??  2. Paroxysmal Aflutter/fibrillation s/p recent DCCV. Currently in sinus rhythm. CHADsVasc score 4 consistent with annual stroke risk of 4.8%. Plan:  - continue apixaban 5 mg po bid  - continue propafenone 225 mg tid  ??  3. Nonobstructive CAD. Isolated episode of chest pain in the weeks prior. Cardiac enzymes are negative.   - continue optimal medical therapy: aspirin 81 mg daily, atorvastatin 10 mg nightly, carvedilol and valsartan as above  - fasting lipid panel and A1C    4.  HTN: not adequately controlled this admission. Improved from yesterday.     - continue with diuresis  - can make BP med adjustments when euvolemic        Plan:  Patient still with some mild dyspnea on exertion and peripheral edema  Feels better with diuresis  Continue IV diuresis and monitor   Increase Diovan for for better BP control and monitor    ??    Thank you for including [place] in the patient's care. We will continue to follow along with this patient. Please call with any questions.       Arva Chafe CNP  [facility]   [phone]  11/12/2019

## 2019-11-12 NOTE — Unmapped (Signed)
[facility] 5PCT Nursing  Note    Patient Name: [name]    Admit Date:11/09/2019     Admission Diagnosis: Tachypnea [R06.82]  Leg swelling [M79.89]  Dyspnea on minimal exertion [R06.00]       [name] is alert and oriented. Patient denies pain. Call light within reach. Bed in lowest position. RN will continue plan of care.      Patient Vitals for the past 12 hrs:   BP Temp Temp src Pulse Resp SpO2 Weight   11/12/19 0532 -- -- -- -- -- -- (!) 246 lb 5 oz (111.7 kg)   11/12/19 0303 145/90 97.7 ??F (36.5 ??C) Oral 63 18 97 % --   11/11/19 2243 (!) 159/97 97.5 ??F (36.4 ??C) Oral 55 18 95 % --   11/11/19 1922 168/88 97.5 ??F (36.4 ??C) Oral 54 18 95 % --         O2 Device: None (Room air)           O2 Flow Rate (L/min): 0 L/min       I/O last 3 completed shifts:  In: 1260 [P.O.:1260]  Out: 699 [Urine:699]      I/O this shift:  In: 860 [P.O.:860]  Out: 750 [Urine:750]         Larna Capelle, RN 11/12/2019 6:52 AM

## 2019-11-12 NOTE — Unmapped (Addendum)
Pharmacy Apixaban Education     [name] is a 79 y.o. male receiving apixaban for afib (PTA)    Education was provided in person         Patient and/or family educated on the following points regarding their apixaban therapy:    ??? Indication  o Indication for apixaban therapy  o Dose/interval and importance of consistent dosing  ??? Adherence  o Importance of taking apixaban as instructed and what to do in case of a late or missed dose  o Never double up on dosages  o Do not stop taking this drug without your physician's permission, even if you start to feel better  ??? Monitoring  o This medication does not require routine monitoring; however, physician may order additional testing as needed  ??? Drug Interactions  o Potential for apixaban interactions with OTC/Prescription/Herbal supplements, which may make patient more likely to bleed/clot  ??? Adverse Drug Events  o Emphasized apixaban increases the risk of bleeding  - Monitor for signs/symptoms such as unusual bruising, nosebleeds, bleeding of the gums, cuts that take a long time to heal, blood in the urine or stool, etc.   - Avoid situations in which you are more likely to fall or hit your head   ??? Other  o Do not start or stop taking any medication without the advice of your physician or pharmacist  o Notify dentist or other physician if performing procedure or surgery  o Avoid excessive alcohol consumption, using the same pharmacy to fill prescriptions allows screening for drug interactions  o Tell your physician if you plan to become pregnant or plan to breast feed while on this medication    Patient and/or family questions and concerns were addressed during the counseling session. Written educational materials on the above information were provided for patient and/or family review.  Education provided utilizing the teach back method.  Barriers to apixaban education include: None evident.       BENJAMIN HARVEY, Pharm.D.  PGY1 Pharmacy Resident   11/12/2019  4:43 PM  Phone: [phone]    For clinical pharmacy assistance on evenings and weekends, please call [phone].

## 2019-11-13 DIAGNOSIS — I110 Hypertensive heart disease with heart failure: Principal | ICD-10-CM

## 2019-11-13 LAB — BASIC METABOLIC PANEL
Anion Gap: 5 mmol/L (ref 3–16)
BUN: 20 mg/dL (ref 7–25)
CO2: 28 mmol/L (ref 21–33)
Calcium: 8.9 mg/dL (ref 8.6–10.3)
Chloride: 102 mmol/L (ref 98–110)
Creatinine: 1.06 mg/dL (ref 0.60–1.30)
Glucose: 223 mg/dL (ref 70–100)
Osmolality, Calculated: 290 mOsm/kg (ref 278–305)
Potassium: 4.2 mmol/L (ref 3.5–5.3)
Sodium: 135 mmol/L (ref 133–146)
eGFR AA CKD-EPI: 78 See note.
eGFR NONAA CKD-EPI: 67 See note.

## 2019-11-13 LAB — POC GLU MONITORING DEVICE
POC Glucose Monitoring Device: 299 mg/dL (ref 70–100)
POC Glucose Monitoring Device: 271 mg/dL (ref 70–100)
POC Glucose Monitoring Device: 209 mg/dL (ref 70–100)

## 2019-11-13 MED ORDER — insulin lispro (humaLOG) injection 0-4 Units
100 | Freq: Every evening | SUBCUTANEOUS | Status: AC
Start: 2019-11-13 — End: 2019-11-14
  Administered 2019-11-14: 01:00:00 4 [IU] via SUBCUTANEOUS

## 2019-11-13 MED ORDER — furosemide (LASIX) injection 40 mg
10 | Freq: Every day | INTRAMUSCULAR | Status: AC
Start: 2019-11-13 — End: 2019-11-14
  Administered 2019-11-13 – 2019-11-14 (×2): 40 mg via INTRAVENOUS

## 2019-11-13 MED FILL — FUROSEMIDE 10 MG/ML INJECTION SOLUTION: 10 10 mg/mL | INTRAMUSCULAR | Qty: 4

## 2019-11-13 MED FILL — HYDRALAZINE 10 MG TABLET: 10 10 MG | ORAL | Qty: 1

## 2019-11-13 MED FILL — CARVEDILOL 12.5 MG TABLET: 12.5 12.5 MG | ORAL | Qty: 2

## 2019-11-13 MED FILL — SYNTHROID 100 MCG TABLET: 100 100 mcg | ORAL | Qty: 1

## 2019-11-13 MED FILL — TAMSULOSIN 0.4 MG CAPSULE: 0.4 0.4 mg | ORAL | Qty: 1

## 2019-11-13 MED FILL — PROPAFENONE 225 MG TABLET: 225 225 MG | ORAL | Qty: 1

## 2019-11-13 MED FILL — VALSARTAN 160 MG TABLET: 160 160 MG | ORAL | Qty: 2

## 2019-11-13 MED FILL — ASPIRIN 81 MG TABLET,DELAYED RELEASE: 81 81 MG | ORAL | Qty: 1

## 2019-11-13 MED FILL — PANTOPRAZOLE 40 MG TABLET,DELAYED RELEASE: 40 40 MG | ORAL | Qty: 1

## 2019-11-13 MED FILL — ELIQUIS 5 MG TABLET: 5 5 mg | ORAL | Qty: 1

## 2019-11-13 NOTE — Unmapped (Signed)
Problem: Knowledge Deficit  Goal: Patient/family/caregiver demonstrates understanding of disease process, treatment plan, medications, and discharge instructions  Description: Complete learning assessment and assess knowledge base.  Outcome: Progressing     Problem: Fall Prevention  Goal: Patient will remain free of falls  Description: Assess and monitor vitals signs, neurological status including level of consciousness and orientation.  Reassess fall risk per hospital policy.    Ensure arm band on, uncluttered walking paths in room, adequate room lighting, call light and overbed table within reach, bed in low position, wheels locked, side rails up per policy (excluding SNF), and non-skid footwear provided.   Outcome: Progressing

## 2019-11-13 NOTE — Unmapped (Signed)
Problem: Glucose Imbalance related to diabetes disease process  Goal: Clinical indication of glucose balance is achieved  Outcome: Progressing     Problem: Fall Prevention  Goal: Patient will remain free of falls  Description: Assess and monitor vitals signs, neurological status including level of consciousness and orientation.  Reassess fall risk per hospital policy.    Ensure arm band on, uncluttered walking paths in room, adequate room lighting, call light and overbed table within reach, bed in low position, wheels locked, side rails up per policy (excluding SNF), and non-skid footwear provided.   Outcome: Progressing

## 2019-11-13 NOTE — Unmapped (Signed)
[facility] 5PCT Nursing  Note    Patient Name: [name]    Admit Date:11/09/2019     Admission Diagnosis: Tachypnea [R06.82]  Leg swelling [M79.89]  Dyspnea on minimal exertion [R06.00]       [name] is alert and oriented. Patient denies pain. Patient complies with fluid restriction. No acute event overnight. Attended to all needs. Call light within reach. Bed in lowest position.  RN will continue plan of care.      Patient Vitals for the past 12 hrs:   BP Temp Temp src Pulse Resp SpO2   11/13/19 0512 134/71 -- -- 63 -- --   11/13/19 0252 (!) 161/102 -- -- -- -- --   11/13/19 0248 (!) 177/103 97.7 ??F (36.5 ??C) Oral 85 18 96 %   11/12/19 2313 159/88 97.7 ??F (36.5 ??C) Oral 65 18 96 %   11/12/19 1857 164/77 97.8 ??F (36.6 ??C) Oral 51 18 97 %         O2 Device: None (Room air)           O2 Flow Rate (L/min): 0 L/min       I/O last 3 completed shifts:  In: 1520 [P.O.:1520]  Out: 1449 [Urine:1449]      I/O this shift:  In: 1040 [P.O.:1040]  Out: 1300 [Urine:1300]       Truda Staub, RN 11/13/2019 5:24 AM

## 2019-11-13 NOTE — Unmapped (Signed)
[facility]ITAL[facility] PROGRESS NOTE:    Admit Date: 11/09/2019     Visit Date: 11/13/2019  Admit class: Inpatient      527/M527    Subjective:       Interval History: again feeling better after Lasix. Good UOP. No CP. SOB improving. No pain. Siting on edge of bed eating lunch.     ROS: No CP/SOB, No N/V, No fever/chills, No HA    Physical Exam:    BP 146/80 (BP Location: Right arm, Patient Position: Sitting)    Pulse 53    Temp 97.5 ??F (36.4 ??C) (Oral)    Resp 18    Ht 6' (1.829 m)    Wt (!) 246 lb 11.2 oz (111.9 kg)    SpO2 96%    BMI 33.46 kg/m??   General appearance: alert, appears stated age and cooperative  Lungs: clear to auscultation bilaterally and no respiratory distress  Heart: regular rate and rhythm, S1, S2 normal, no murmur, click, rub or gallop  Abdomen: soft, non-tender; bowel sounds normal; no masses,  no organomegaly  Extremities: + 1 edema  Skin: Skin color, texture, turgor normal. No rashes or lesions  Neurologic: Grossly normal     No results for input(s): POCGLU in the last 72 hours.    Intake/Output Summary (Last 24 hours) at 11/13/2019 1254  Last data filed at 11/13/2019 1210  Gross per 24 hour   Intake 1276 ml   Output 3050 ml   Net -1774 ml       Labs:      No results for input(s): WBC, HGB, HCT, PLT in the last 72 hours.                                                               Recent Labs     11/11/19  0525 11/12/19  0537 11/13/19  0619   NA 138 139 135   K 3.9 4.4 4.2   CL 102 102 102   CO2 29 32 28   BUN 23 21 20    CREATININE 1.21 1.17 1.06   GLUCOSE 199* 200* 223*     No results for input(s): INR in the last 72 hours.    ??? apixaban  5 mg Oral BID   ??? aspirin  81 mg Oral Daily 0900   ??? atorvastatin  10 mg Oral Nightly (2100)   ??? carvediloL  25 mg Oral BID   ??? furosemide (LASIX) injection  40 mg Intravenous Daily 0900   ??? insulin lispro  0-5 Units Subcutaneous TID AC   ??? levothyroxine  100 mcg Oral QAM   ??? pantoprazole  40 mg Oral DAILY 0600   ??? propafenone  225 mg Oral TID   ??? tamsulosin  0.4 mg  Oral Nightly (2100)   ??? valsartan  320 mg Oral Daily 0900       Assessment & Plan:    ASSESSMENT  1. DOE  2. CHF exacerbation  3. H/o Afib, s/p cardioversion the day PTA , ? back in Afib in ER  4. H/o A-flutter s/p ablation and mini-Maze procedure  5. DM  6. Dilated CM  7. HTN, uncontrolled  8. OSA, home CPAP  9. Hypothyroid   ??  PLAN  Tele  Lasix IV  Monitor I/O, daily wts  Consulted cardiology, apprec help  Echo nl EF, +LVH  PRN HDLZ for elevated BPs  Cont home Eliquis  Labs in am, monitor SCr      Disposition: Ip tx, home 1-2 days? Pending cardiology input    Earline Mayotte, D.O.

## 2019-11-13 NOTE — Nursing Note (Signed)
Patient remains alert and oriented x4. Patient had no complaints of pain. Patient vitals stable. On room air. RN medicated per MAR. Patient resting comfortably in bed, bed in lowest position, call light within reach. Patient needs assessed with hourly rounding. Will continue to monitor.

## 2019-11-13 NOTE — Unmapped (Signed)
[facility] Daily Progress Note    Subjective:  [name] is doing well today.    He continues to feel well  No acute events overnight  Denies any chest pain  Dyspnea and leg edema resolving but still mildly present      1. Dyspnea on minimal exertion    2. Leg swelling    3. Tachypnea    4. Acute congestive heart failure, unspecified heart failure type (CMS Dx)    5. Essential hypertension    6. Atypical atrial flutter (CMS Dx)        Past Medical History:   Diagnosis Date   ??? Atrial fibrillation (CMS Dx)     corrected with Mini-maze procedure   ??? Cataract     left eye   ??? Diabetes mellitus (CMS Dx)     type 2   ??? Dilated cardiomyopathy (CMS Dx)    ??? Hearing loss     left ear   ??? Hypercholesteremia    ??? Hypertension    ??? Sleep apnea    ??? Thyroid disease     hypothyroidism       Current Facility-Administered Medications   Medication Dose Route Frequency Provider Last Rate Last Admin   ??? acetaminophen (TYLENOL) tablet 650 mg  650 mg Oral Q4H PRN Marissa Regan Rakers, DO       ??? apixaban (ELIQUIS) tablet 5 mg  5 mg Oral BID Truett Mainland, DO   5 mg at 11/13/19 1610   ??? aspirin EC tablet 81 mg  81 mg Oral Daily 0900 Truett Mainland, DO   81 mg at 11/13/19 9604   ??? atorvastatin (LIPITOR) tablet 10 mg  10 mg Oral Nightly (2100) Marissa Regan Rakers, DO   10 mg at 11/12/19 2026   ??? carvediloL (COREG) tablet 25 mg  25 mg Oral BID Truett Mainland, DO   25 mg at 11/13/19 5409   ??? dextrose 50 % in water (D50W) iv Syrg 25 mL  25 mL Intravenous Q15 Min PRN Marissa Regan Rakers, DO        Or   ??? dextrose 50 % in water (D50W) iv Syrg 50 mL  50 mL Intravenous Q15 Min PRN Marissa Regan Rakers, DO       ??? furosemide (LASIX) injection 40 mg  40 mg Intravenous Daily 0900 Sherian Maroon, CNP   40 mg at 11/13/19 1051   ??? glucose chewable tablet 12 g  12 g Oral Q15 Min PRN Marissa Regan Rakers, DO       ??? hydrALAZINE (APRESOLINE) tablet 10 mg  10 mg Oral Q6H PRN Mordecai Rasmussen., MD    10 mg at 11/13/19 0301   ??? insulin lispro (humaLOG) injection 0-5 Units  0-5 Units Subcutaneous TID AC Marissa Regan Rakers, DO   2 Units at 11/13/19 1731   ??? levothyroxine (SYNTHROID) tablet 100 mcg  100 mcg Oral QAM Truett Mainland, DO   100 mcg at 11/13/19 8119   ??? magnesium hydroxide (MILK OF MAGNESIA) 2,400 mg/10 mL oral suspension 10 mL  10 mL Oral Daily PRN Marissa Regan Rakers, DO       ??? ondansetron (ZOFRAN) tablet 4 mg  4 mg Oral Q8H PRN Marissa Regan Rakers, DO        Or   ??? ondansetron (ZOFRAN) injection 4 mg  4 mg Intravenous Q8H PRN Marissa Regan Rakers, DO       ??? pantoprazole (PROTONIX) EC tablet 40  mg  40 mg Oral DAILY 0600 Truett Mainland, DO   40 mg at 11/13/19 1914   ??? propafenone (RYTHMOL) immediate release tablet 225 mg  225 mg Oral TID Truett Mainland, DO   225 mg at 11/13/19 1208   ??? tamsulosin (FLOMAX) capsule 0.4 mg  0.4 mg Oral Nightly (2100) Marissa Regan Rakers, DO   0.4 mg at 11/12/19 2026   ??? valsartan (DIOVAN) tablet 320 mg  320 mg Oral Daily 0900 Wyvonne Lenz Kise, CNP   320 mg at 11/13/19 0905     No Known Allergies  Active Problems:    * No active hospital problems. *        Objective:  Physical Exam:  Patient Vitals for the past 4 hrs:   BP Temp Temp src Pulse Resp SpO2   11/13/19 1507 (!) 130/98 97.8 ??F (36.6 ??C) Oral 54 18 97 %      Temp (24hrs), Avg:97.7 ??F (36.5 ??C), Min:97.5 ??F (36.4 ??C), Max:97.8 ??F (36.6 ??C)       Vitals:Blood pressure (!) 130/98, pulse 54, temperature 97.8 ??F (36.6 ??C), temperature source Oral, resp. rate 18, height 6' (1.829 m), weight (!) 246 lb 11.2 oz (111.9 kg), SpO2 97 %.      GENERAL: No acute distress. Well developed, well nourished.     HEENT: Normocephalic, atraumatic.  Mucous membranes moist. Extraocular   muscles intact. Pupils equally round bilaterally.    NECK: Supple.  No jugular venous distention, no lymphadenopathy, no carotid bruit.    CARDIOVASCULAR: Regular rate and rhythm.  S1 and S2 are  normal. No murmurs, rubs or gallops.  Point of maximal intensity nondisplaced and nonsustained. No hepatojugular reflux. Capillary refill less than 2 seconds    RESPIRATORY: No respiratory distress. Diminished to auscultation bilaterally.  No rales/rhonchi/wheezes.      GASTROINTESTINAL:  No pulsatile masses.  Normal bowel sounds normal in all four quadrants.  Soft, nondistended, nontender.  No rebound or guarding.    EXTREMITIES: trace peripheral edema.  Palpable peripheral pulses. No cyanosis/clubbing.    NEURO: Cranial Nerve III through XII intact.  No Tremors. No focal deficit.    PSYCHIATRIC: Normal affect, insight, and speech. Alert and oriented x3 to person, place, and time.    SKIN: Intact. Normal skin turgor.  No rashes, no lesions, no erythema.     Date 11/12/19 1500 - 11/13/19 0659 11/13/19 0700 - 11/14/19 0659   Shift 1500-2259 2300-0659 24 Hour Total 0700-1459 1500-2259 2300-0659 24 Hour Total   INTAKE   P.O. [phone] 476   476     P.O. [phone] 476   476   Shift Total(mL/kg) 240(2.1) 800(7.1) 1160(10.4) 476(4.3)   476(4.3)   OUTPUT   Urine(mL/kg/hr) 100(0.1) 1200(1.3) 1300(0.5) 1750(2) 500  2250     Urine 100 1200 1300 1750 500  2250   Stool            Stool Occurrence 0 x  0 x       Shift Total(mL/kg) 100(0.9) 1200(10.7) 1300(11.6) 1750(15.6) 500(4.5)  2250(20.1)   Weight (kg) 111.7 111.9 111.9 111.9 111.9 111.9 111.9       Cardiographics  ECG:  Echocardiogram:     Imaging  Radiology: X-ray Chest PA and Lateral    Result Date: 11/09/2019  EXAM: XR CHEST PA AND LATERAL INDICATION: Dyspnea, unspecified,  dyspnea with exertion x 1 mth, hx of CHF, pls eval for acute CHF exacerbation, DATE: 11/09/2019 12:25 PM EDT COMPARISON:  03/07/2018 FINDINGS: The cardiac and mediastinal contours are within normal limits. There has been interval improvement in the previously seen bibasilar parenchymal opacities. Residual mild linear opacity likely represents residual linear scar. There is mild residual pleural  thickening on the right. Pulmonary vascularity is within normal limits. The vascular markings remain distinct.     IMPRESSION: Interval improvement in the previously seen bibasilar opacities. No evidence of pulmonary edema. Report Verified by: Norberta Keens, MD at 11/09/2019 1:24 PM EDT      Lab Review   Lab Results   Component Value Date    TROPONINI <0.04 03/07/2018     Lab Results   Component Value Date    GLUCOSE 223 (H) 11/13/2019    BUN 20 11/13/2019    CO2 28 11/13/2019    CREATININE 1.06 11/13/2019    K 4.2 11/13/2019    NA 135 11/13/2019    CL 102 11/13/2019    CALCIUM 8.9 11/13/2019     Lab Results   Component Value Date    PHOS 3.6 05/12/2015     Lab Results   Component Value Date    WBC 10.1 11/10/2019    HGB 10.0 (L) 11/10/2019    HCT 30.6 (L) 11/10/2019    MCV 82.2 11/10/2019    PLT 146 11/10/2019     Lab Results   Component Value Date    INR 1.1 03/07/2018     Lab Results   Component Value Date    BNP 208 (H) 11/10/2019     Lab Results   Component Value Date    ALT 18 04/15/2008    AST 15 04/15/2008    ALKPHOS 83 04/15/2008    BILITOT 0.5 04/15/2008     Lab Results   Component Value Date    CHOLTOT 163 04/15/2008    TRIG 186 (H) 04/15/2008    HDL 31 (L) 04/15/2008    CHOLHDL 5.3 (H) 04/15/2008    LDL 95 04/15/2008     Lab Results   Component Value Date    HGBA1C 6.9 (H) 11/12/2019     Lab Results   Component Value Date    TSH 3.63 11/09/2019           Assessment/Plan:  Mr. 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   ??  1. Exertional dyspnea/HFpEF: He still appears mildly volume up on exam today. Echocardiogram with normal LVEF. 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   -Missed a dose  of IV lasix yesterday   - lasix 40 mg iv today, goal net negative 1-2 liters   - continue carvedilol 25 mg bid, valsartan 320 mg daily   - hydralazine 10 mg po q6h prn for SBP > 160 mmHg  ??  2. Paroxysmal Aflutter/fibrillation s/p recent DCCV. Currently in sinus rhythm. CHADsVasc score 4 consistent with annual stroke risk of 4.8%. Plan:   - continue apixaban 5 mg po bid   - continue propafenone 225 mg tid  ??  3. Nonobstructive CAD. Isolated episode of chest pain in the weeks prior. Cardiac enzymes are negative. Currently denies any chest pain   - continue optimal medical therapy: aspirin 81 mg daily, atorvastatin 10 mg nightly, carvedilol and valsartan as  above   - fasting lipid pane    4. HTN: Improved.    - continue to monitor with diuresis   - can make further BP med adjustments when euvolemic        Plan:  Continue IV diuresis and monitor   No changes made      Thank you for including [place] in the patient's care. We will continue to follow along with this patient. Please call with any questions.         Ramell Wacha L. Kizzie Bane, [facility]  [facility]  Pager [phone]  Pager [phone] After 5 PM and weekends

## 2019-11-13 NOTE — Unmapped (Signed)
[facility]  Case Management/Social Work Department  Progress Note    Patient Information     Hospital day: 1  Inpatient/Observation:  Inpatient   Admit date:  11/09/2019  Admission diagnosis: Tachypnea [R06.82]  Leg swelling [M79.89]  Dyspnea on minimal exertion [R06.00]    Other Pertinent Information     CM continues to follow patient for d/c planning needs.  Patient continues treatment for CHF- on IV lasix, cardiology consulted for recs.   Plans to return home when medically ready with family with no HHC or DME needs.  Family to transport at d/c.    Discharge Plan     Anticipated discharge plan:  Home with family    Discharge barriers: Consult recs, IV lasix     Anticipated discharge date:  11/15/2019       CM/SW will continue to follow and remain available until hospital discharge for discharge planning needs.      Charlott Rakes, RN CM    [phone]

## 2019-11-13 NOTE — Unmapped (Signed)
Problem: Potential for imbalanced nutrition related to metabolic effect of diabetes  Goal: Patient's nutritional needs will be met  Outcome: Progressing     Problem: Fall Prevention  Goal: Patient will remain free of falls  Description: Assess and monitor vitals signs, neurological status including level of consciousness and orientation.  Reassess fall risk per hospital policy.    Ensure arm band on, uncluttered walking paths in room, adequate room lighting, call light and overbed table within reach, bed in low position, wheels locked, side rails up per policy (excluding SNF), and non-skid footwear provided.   Outcome: Progressing

## 2019-11-14 LAB — LIPID PANEL
Cholesterol, Total: 114 mg/dL (ref 0–200)
HDL: 24 mg/dL (ref 60–92)
LDL Cholesterol: 70 mg/dL
Triglycerides: 98 mg/dL (ref 10–149)

## 2019-11-14 LAB — BASIC METABOLIC PANEL
Anion Gap: 6 mmol/L (ref 3–16)
BUN: 23 mg/dL (ref 7–25)
CO2: 28 mmol/L (ref 21–33)
Calcium: 8.9 mg/dL (ref 8.6–10.3)
Chloride: 102 mmol/L (ref 98–110)
Creatinine: 1.1 mg/dL (ref 0.60–1.30)
Glucose: 232 mg/dL (ref 70–100)
Osmolality, Calculated: 293 mOsm/kg (ref 278–305)
Potassium: 4 mmol/L (ref 3.5–5.3)
Sodium: 136 mmol/L (ref 133–146)
eGFR AA CKD-EPI: 74 See note.
eGFR NONAA CKD-EPI: 64 See note.

## 2019-11-14 LAB — POC GLU MONITORING DEVICE
POC Glucose Monitoring Device: 350 mg/dL (ref 70–100)
POC Glucose Monitoring Device: 209 mg/dL (ref 70–100)
POC Glucose Monitoring Device: 242 mg/dL (ref 70–100)
POC Glucose Monitoring Device: 390 mg/dL (ref 70–100)

## 2019-11-14 MED ORDER — furosemide (LASIX) 20 MG tablet
20 | ORAL_TABLET | Freq: Every day | ORAL | 0 refills | Status: AC
Start: 2019-11-14 — End: ?

## 2019-11-14 MED ORDER — furosemide (LASIX) tablet 20 mg
20 | Freq: Every day | ORAL | Status: AC
Start: 2019-11-14 — End: 2019-11-14

## 2019-11-14 MED ORDER — APIXABAN 5 MG PO TABS
5 MG | Freq: Two times a day (BID) | ORAL | Status: DC
Start: 2019-11-14 — End: 2019-11-14

## 2019-11-14 MED ORDER — ATORVASTATIN CALCIUM 10 MG PO TABS
10 MG | Freq: Every evening | ORAL | Status: DC
Start: 2019-11-14 — End: 2019-11-14

## 2019-11-14 MED ORDER — GENERIC EXTERNAL MEDICATION
Status: DC
Start: 2019-11-14 — End: 2019-11-14

## 2019-11-14 MED ORDER — HYDRALAZINE HCL 10 MG PO TABS
10 MG | Freq: Four times a day (QID) | ORAL | Status: DC | PRN
Start: 2019-11-14 — End: 2019-11-14

## 2019-11-14 MED ORDER — PROPAFENONE HCL 225 MG PO TABS
225 MG | Freq: Three times a day (TID) | ORAL | Status: DC
Start: 2019-11-14 — End: 2019-11-14

## 2019-11-14 MED ORDER — CARVEDILOL 12.5 MG PO TABS
12.5 MG | Freq: Two times a day (BID) | ORAL | Status: DC
Start: 2019-11-14 — End: 2019-11-14

## 2019-11-14 MED ORDER — INSULIN LISPRO 100 UNIT/ML SC SOLN
100 UNIT/ML | Freq: Three times a day (TID) | SUBCUTANEOUS | Status: DC
Start: 2019-11-14 — End: 2019-11-14

## 2019-11-14 MED ORDER — LEVOTHYROXINE SODIUM 100 MCG PO TABS
100 MCG | Freq: Every morning | ORAL | Status: DC
Start: 2019-11-14 — End: 2019-11-14

## 2019-11-14 MED ORDER — VALSARTAN 160 MG PO TABS
160 | ORAL | Status: DC
Start: 2019-11-14 — End: 2019-11-14

## 2019-11-14 MED ORDER — FUROSEMIDE 20 MG PO TABS
20 MG | Freq: Every day | ORAL | Status: DC
Start: 2019-11-14 — End: 2019-11-14

## 2019-11-14 MED ORDER — GLUCOSE-VITAMIN C 4-6 GM-MG PO CHEW
4-6 GM-MG | ORAL | Status: DC
Start: 2019-11-14 — End: 2019-11-14

## 2019-11-14 MED ORDER — ACETAMINOPHEN 325 MG PO TABS
325 MG | ORAL | Status: DC | PRN
Start: 2019-11-14 — End: 2019-11-14

## 2019-11-14 MED ORDER — PANTOPRAZOLE SODIUM 40 MG PO TBEC
40 MG | ORAL | Status: DC
Start: 2019-11-14 — End: 2019-11-14

## 2019-11-14 MED ORDER — TAMSULOSIN HCL 0.4 MG PO CAPS
0.4 MG | Freq: Every evening | ORAL | Status: DC
Start: 2019-11-14 — End: 2019-11-14

## 2019-11-14 MED ORDER — ASPIRIN 81 MG PO TBEC
81 MG | Freq: Every day | ORAL | Status: DC
Start: 2019-11-14 — End: 2019-11-14

## 2019-11-14 MED ORDER — MAGNESIUM HYDROXIDE 2400 MG/10ML PO SUSP
2400 MG/10ML | Freq: Every day | ORAL | Status: DC | PRN
Start: 2019-11-14 — End: 2019-11-14

## 2019-11-14 MED FILL — ATORVASTATIN 10 MG TABLET: 10 10 MG | ORAL | Qty: 1

## 2019-11-14 MED FILL — VALSARTAN 160 MG TABLET: 160 160 MG | ORAL | Qty: 2

## 2019-11-14 MED FILL — FUROSEMIDE 10 MG/ML INJECTION SOLUTION: 10 10 mg/mL | INTRAMUSCULAR | Qty: 4

## 2019-11-14 MED FILL — ELIQUIS 5 MG TABLET: 5 5 mg | ORAL | Qty: 1

## 2019-11-14 MED FILL — CARVEDILOL 12.5 MG TABLET: 12.5 12.5 MG | ORAL | Qty: 2

## 2019-11-14 MED FILL — SYNTHROID 100 MCG TABLET: 100 100 mcg | ORAL | Qty: 1

## 2019-11-14 MED FILL — ASPIRIN 81 MG TABLET,DELAYED RELEASE: 81 81 MG | ORAL | Qty: 1

## 2019-11-14 MED FILL — PANTOPRAZOLE 40 MG TABLET,DELAYED RELEASE: 40 40 MG | ORAL | Qty: 1

## 2019-11-14 MED FILL — PROPAFENONE 225 MG TABLET: 225 225 MG | ORAL | Qty: 1

## 2019-11-14 NOTE — Nursing Note (Signed)
D: Orders received to discharge patient home. A: Discharge instructions reviewed with patient and family. Education materials and prescriptions provided. PIV removed. R: Patient and family verbalized understanding of instructions using teach back method. Patient transported to main lobby for discharge home with family.

## 2019-11-14 NOTE — Unmapped (Signed)
[facility]  Case Management/Social Work Department  Progress Note    Patient Information     Hospital day: 2  Inpatient/Observation:  Inpatient   Admit date:  11/09/2019  Admission diagnosis: Tachypnea [R06.82]  Leg swelling [M79.89]  Dyspnea on minimal exertion [R06.00]    Other Pertinent Information     CM continues to follow patient for d/c planning needs.  Patient continues treatment for CHF exacerbation- on IV lasix and monitoring.  Cardiology consult following for recs.  When medically ready patient plans to return home with family with no HHC or DME needs.  Family to transport.     Discharge Plan     Anticipated discharge plan:  Home with family support    Discharge barriers: Cardiology consult recs, IV lasix     Anticipated discharge date:  11/15/2019 ?      CM/SW will continue to follow and remain available until hospital discharge for discharge planning needs.      Charlott Rakes, RN CM    [phone]

## 2019-11-14 NOTE — Unmapped (Signed)
Heart Failure Management    Patient follow up appointment scheduled: Yes    Follow up appointment: cardio    Provider or Home Health Agency: Dr Elson Clan  Location: Office  Date: 11/27/19  Time: noon

## 2019-11-14 NOTE — Unmapped (Signed)
[facility]  Discharge Summary      PATIENT INFORMATION   Patient's PCP:  Marcello Fennel, MD  Name: [name]  DOB: 10-20-1940  MRN: [id number]     Admit Date:  11/09/2019    Discharge Date:  11/14/2019    Patient Class:  Inpatient    ADMITTING / DISCHARGING PHYSICIAN   Admitting Physician:  Truett Mainland, DO    Discharge Physician:  Alvina Filbert , MD    HOSPITAL COURSE     Discharge Diagnoses:   Acute congestive heart failure exacerbation with preserved ejection fraction  History of A. Fib/flutter on anticoagulation       Status post ablation and mini maze procedure   Hypothyroidism  Hyperlipidemia  BPH    Consulting Physicians:  Cardiology    History of Present Illness per HPI:   [name] is a 79 y.o. male per HPI  The following is from the ER report. I have reviewed the information with the patient and made any necessary changes:  ??  [name] is a 79 y.o. male with prior medical history significant for atrial fibrillation with reported cardioversion yesterday and anticoagulation on Eliquis, noninsulin-dependent diabetes, and dilated cardiomyopathy??who presents to the Emergency Department with??20-month??complaint of dyspnea with exertion and leg swelling.  ??  The patient reports worsening dyspnea with exertion as well as bilateral leg swelling over the last month. ??He reports that he is unable to walk to the end of his driveway without feeling significantly short of breath. ??He also reports worsening orthopnea. ??He believes that he has gained approximately 10 pounds over the last month. ??The patient reports prior history of congestive heart failure. ??He denies home oxygen use. ??He sleeps??with a CPAP. ??The patient reports associated wheezing but denies prior history of asthma or COPD. ??He denies any tobacco use or secondhand smoke exposure. ??The patient denies associated cough. ??He denies recent fever or chills. ??He denies recent URI symptoms. ??He denies  associated pain and specifically denies any chest pain.  ??  The patient reports??he??has been otherwise well. ??The patient denies recent loss of appetite, nausea, vomiting, or diarrhea.   ??  On my exam, pt and family at [facility] confirm the above. DOE x 1 month. Swelling to BLE last few weeks. Had cardioversion for Afib yesterday. Pt says it was successful. Appears to be in Afib at this time however. Last echo was in 2019. Says no echo was done prior to, or with, the cardioversion yesterday. Is on Eliquis for last 4-5 years, used to be on coumadin years back but changed to Eliquis and has been compliant with this. No h/o DVT/PE, no recent long car rides or plane trips. Ambulatory at home. No calf tenderness/redness.             Brief Hospital Course:   Patient was admitted to the hospital and the following problems were managed:    Acute CHF exacerbation  The patient had an echocardiogram that was obtained on November 17, 2019 with an ejection fraction of 55 to 60%.  He was diuresed with IV Lasix.  He has a negative fluid balance of 1254.  There was a question about the actual weight of admission of 248 versus 255 pounds.  Weight upon discharge was 247 pounds.  Upon discharge she was reinitiated back on furosemide at 20 mg.  He continued on losartan at 320 mg p.o. daily.  He has a follow-up visit with his cardiologist Dr.  Andrey Campanile MD.    He has a known history of atrial fib/flutter.  He has had an ablation with a mini maze procedure.  He remained in normal sinus rhythm with rate and rhythm controlled.  He continued on the following while hospitalized :  carvedilol 25 mg p.o. twice daily  apixaban 5 mg p.o. twice daily  propafenone 225 p.o. 3 times daily    In addition he has known history of hyperlipidemia for which she continued on home statin dose atorvastatin at 10 mg p.o. nightly.    He has known history of hypothyroidism.  He continued on home dose Synthroid 100 mcg p.o. daily.    He has known history of BPH.  He  continued on home dose tamsulosin nightly.    PHYSICAL EXAM   Physical exam on day of hospital discharge:     BP 143/59 (BP Location: Left arm, Patient Position: Lying)    Pulse 60    Temp 97.6 ??F (36.4 ??C) (Oral)    Resp 16    Ht 6' (1.829 m)    Wt (!) 247 lb 8 oz (112.3 kg)    SpO2 96%    BMI 33.57 kg/m??   General Appearance:  Alert, cooperative, no distress   Head:  Normocephalic, without obvious abnormality, atraumatic    ?? ??   Throat:  Moist mucus membranes    Neck:  Supple, trachea midline   Lungs:  Clear to auscultation bilaterally, respirations unlabored    Heart:  Regular rate and rhythm, S1 and S2 normal   Abdomen:  Soft, non-tender, bowel sounds active all four quadrants   Extremities:  Extremities normal, trace to +1 pitting edema with wrinkled appearance   Pulses:  2+ and symmetric    Skin:  Warm and dry   Neurologic:  Face symmetric, speech fluent, no tremor   ??      LABS   Labs prior to hospital discharge:  No results for input(s): WBC, HGB, HCT, PLT in the last 72 hours.                                                               Recent Labs     11/12/19  0537 11/13/19  0619 11/14/19  0632   NA 139 135 136   K 4.4 4.2 4.0   CL 102 102 102   CO2 32 28 28   BUN 21 20 23    CREATININE 1.17 1.06 1.10   GLUCOSE 200* 223* 232*       SURGERIES       LINES / DRAINS / AIRWAYS     Patient Lines/Drains/Airways Status    Active Line / PIV Line     Name:   Placement date:   Placement time:   Site:   Days:    Peripheral IV 11/09/19 Left Antecubital   11/09/19    1404    Antecubital   4         NHSN Device Days (10/15/2019 to 11/13/2019)     Central line: 0    Urinary catheter: 0                IMAGING STUDIES   X-ray Chest PA and Lateral    Result Date: 11/09/2019  EXAM: XR  CHEST PA AND LATERAL INDICATION: Dyspnea, unspecified,  dyspnea with exertion x 1 mth, hx of CHF, pls eval for acute CHF exacerbation, DATE: 11/09/2019 12:25 PM EDT COMPARISON: 03/07/2018 FINDINGS: The cardiac and mediastinal contours are within  normal limits. There has been interval improvement in the previously seen bibasilar parenchymal opacities. Residual mild linear opacity likely represents residual linear scar. There is mild residual pleural thickening on the right. Pulmonary vascularity is within normal limits. The vascular markings remain distinct.     IMPRESSION: Interval improvement in the previously seen bibasilar opacities. No evidence of pulmonary edema. Report Verified by: Norberta Keens, MD at 11/09/2019 1:24 PM EDT    Echo 2D Comp. w/Contrast (TTE)    Result Date: 11/11/2019                            * [facility]                      Department of Cardiology*                        [address]                       [place], [place] [address]                            [phone] Transthoracic Echocardiography Patient:    [name], [name] MR #:       [id number] Account: Study Date: 11/11/2019 Gender:     M Age:        2 DOB:        1941/02/14 Room:       [facility]  REFERRING    Derrell Lolling,  PERFORMING   [facility], [facility]  SONOGRAPHER  Otelia Limes RDCS, RVT  ORDERING     Derrell Lolling, Marissa Caroline  REFERRING    [place], [place] [facility]   Janalyn Shy, Simmie Davies  ATTENDING    Derrell Lolling, Marissa [place]  ADMITTING    Truett Mainland ------------------------------------------------------------------- Procedure:ECHO 2D COMPLETE W/CONTRAST (TTE)            Order: Accession Number:[id number] ------------------------------------------------------------------- Indications:     Shortness of breath R06.02. ------------------------------------------------------------------- PMH:   Chest pain.  Atrial fibrillation.  Congestive heart failure.  Risk factors:  Hypertension. ------------------------------------------------------------------- Study data:  Height: 72in. 182.9cm. Weight: 254.5lb. 115.7kg. Study status:  Routine.  Procedure:  Transthoracic echocardiography. Image quality was good. Scanning was  performed from the parasternal, apical, and subcostal acoustic windows. Intravenous contrast (Optison) 6 ml was administered. Transthoracic echocardiography.  M-mode, complete 2D, complete spectral Doppler, and color Doppler.  Birthdate:  Patient birthdate: 19-Jun-1940.  Age:  Patient is 79yr old.  Sex:  Birth gender: male.  Body mass index:  BMI: 34.6kg/m^2.  Body surface area:    BSA: 2.42m^2.  Blood pressure:     152/80  Patient status:  Inpatient.  Study date:  Study date: 11/11/2019. Study time: 10:54 AM.  Location:  Echo laboratory. ------------------------------------------------------------------- Study Conclusions - Left ventricle: The cavity size is normal. Wall thickness was increased increased in a pattern of   mild to moderate LVH. Systolic function was normal. The estimated ejection fraction was in the   range of 55% to 60%. Wall motion was normal; there were no regional wall  motion abnormalities.   Abnormal relaxation with increased filling pressures. - Right ventricle: Systolic function was normal. TAPSE: 1.8cm.Tricuspid annular systolic velocity:   9.5cm/s. ------------------------------------------------------------------- Cardiac Anatomy Left ventricle: - The cavity size is normal. Wall thickness was increased increased in a pattern of mild to moderate   LVH. Systolic function was normal. The estimated ejection fraction was in the range of 55% to 60%.   Wall motion was normal; there were no regional wall motion abnormalities. - Wall motion score: 1.00. - Abnormal relaxation with increased filling pressures. Aorta:   Aortic root: - The aortic root is normal in size. Aortic valve: - Trileaflet; mildly thickened leaflets. Mobility was not restricted. Doppler: - Transvalvular velocity is within the normal range. There is no stenosis. No regurgitation. Mitral valve: - Mildly thickened leaflets, . Mobility was not restricted. Doppler: - Transvalvular velocity is within the normal range. There is no  evidence for stenosis. Trivial   regurgitation. Left atrium: - The atrium is normal in size. Pulmonary artery: - Not well visualized. The main pulmonary artery was normal-sized. Systolic pressure could not be   accurately estimated. Main pulmonary artery: - Systemic veins: Inferior vena cava: Not well visualized. Right ventricle: - The cavity size is normal. Systolic function was normal. TAPSE: 1.8cm.Tricuspid annular systolic   velocity: 9.5cm/s. Pulmonic valve: - Poorly visualized. Doppler: - Transvalvular velocity is within the normal range. There is no evidence for stenosis. Mild   regurgitation. Tricuspid valve: - The valve appears to be grossly normal. Mobility was not restricted. Doppler: - Transvalvular velocity is within the normal range. Mild regurgitation. Right atrium: - The atrium is normal in size. Pericardium: - There is no pericardial effusion. Systemic veins: Inferior vena cava: - Not well visualized. ------------------------------------------------------------------- Measurements  Left ventricle                 Value        04/19/2017 Ref  EDD, LAX             (N)       5.4   cm     4.7        4.2 - 5.8  ESD, LAX             (N)       3.4   cm     2.3        2.5 - 4.0  EDD/bsa, LAX         (N)       2.3   cm/m^2 ---------- 2.2 - 3.0  ESD/bsa, LAX         (N)       1.4   cm/m^2 ---------- 1.3 - 2.1  FS, LAX              (N)       37    %      52         25 - 43  FS, LAX chord        (N)       37    %      ---------- 25 - 43  EDD, LAX max         (N)       5.2   cm     ---------- 4.2 - 5.8  EDD/bsa, LAX max     (N)       2.2   cm/m^2 ---------- 2.2 - 3.0  ESD                  (  N)       3.4   cm     ---------- 2.5 - 4.0  ESD/bsa              (N)       1.4   cm/m^2 ---------- 1.3 - 2.1  PW, ED               (H)       1.4   cm     ---------- 0.6 - 1.0  IVS/PW, ED                     1.01         ---------- ----------  EDV                  (N)       141   ml     ---------- 62 - 150  ESV                   (N)       47    ml     12         21 - 61  EF                   (N)       66    %      89         52 - 72  EDV/bsa              (N)       60    ml/m^2 ---------- 34 - 74  ESV/bsa              (N)       20    ml/m^2 5          11  - 31  SV, 1-p A2C                    94    ml     ---------- ----------  SV/bsa, 1-p A2C                39.7  ml/m^2 ---------- ----------  SV, 1-p A4C                    118   ml     ---------- ----------  SV/bsa, 1-p A4C                50    ml/m^2 ---------- ----------  E', lat ann, TDI     (L)       8.8   cm/sec ---------- >=10.0  E/e', lat ann, TDI             14           ---------- ----------  E', med ann, TDI     (L)       6.3   cm/sec ---------- >=7.0  E/e', med ann, TDI             19           ---------- ----------  E', avg, TDI                   7.6   cm/sec ---------- ----------  E/e', avg, TDI       (H)       16           ---------- <=  14   LVOT                           Value        04/19/2017 Ref  Peak vel, S                    0.73  m/sec  0.02       ----------  Mean vel, S                    0.48  m/sec  ---------- ----------   Ventricular septum             Value        04/19/2017 Ref  IVS, ED              (H)       1.4   cm     ---------- 0.6 - 1.0   Right ventricle                Value        04/19/2017 Ref  EDD, LAX                       2.5   cm     3.2        ----------  TAPSE, MM            (N)       1.8   cm     ---------- 1.7 - 3.1  S' lateral           (N)       9.5   cm/sec 15.0       6.0 - 13.4   Left atrium                    Value        04/19/2017 Ref  AP dim, ES           (H)       4.6   cm     ---------- 3.0 - 4.0  AP dim index         (N)       1.9   cm/m^2 ---------- 1.5 - 2.3  Area ES, A4C         (N)       13    cm^2   19         <=20  Area ES, A2C                   15    cm^2   18         ----------  SI dim, A2C                    5.2   cm     ---------- ----------  Vol, ES, 1-p A4C     (N)       26    ml     ---------- 18 - 58  Vol/bsa, ES, 1-p A4C (L)        11    ml/m^2 ---------- 12 - 37  Vol, ES, 1-p A2C     (N)       38    ml     ---------- 18 - 58  Vol/bsa, ES, 1-p A2C (N)       16  ml/m^2 ---------- 11 - 43  Vol, ES, 2-p                   32    ml     ---------- ----------  Vol/bsa, ES, 2-p     (L)       13    ml/m^2 ---------- 16 - 34   Right atrium                   Value        04/19/2017 Ref  Area, ES, A4C        (N)       17    cm^2   ---------- 10 - 18   Aortic valve                   Value        04/19/2017 Ref  Peak v, S                      1.3   m/sec  1          ----------  Mean v, S                      0.86  m/sec  ---------- ----------  Mean grad, S                   3     mm Hg  1          ----------  LVOT/AV, Vpeak ratio           0.57         0.02       ----------  LVOT/AV, Vmean ratio           0.56         ---------- ----------   Mitral valve                   Value        04/19/2017 Ref  Peak E                         1.2   m/sec  0.62       ----------  Decel time                     257   ms     296        ----------  Peak grad, D                   6     mm Hg  ---------- ----------   Pulmonic valve                 Value        04/19/2017 Ref  Peak v, S                      0.9   m/sec  0.9        ----------  Mean vel, S                    0.52  m/sec  0.52       ----------   Tricuspid valve                Value        04/19/2017  Ref  TR peak v            (H)       3     m/sec  ---------- <=2.8  Peak RV-RA grad, S             35    mm Hg  ---------- ----------  Max TR vel                     2.35  m/sec  ---------- ----------   Aortic root                    Value        04/19/2017 Ref  Root diam            (N)       3.0   cm     ---------- <4.4   Main pulmonary artery          Value        04/19/2017 Ref  Mean grad                      1     mm Hg  ---------- ----------  Legend: (L)  and  (H)  mark values outside specified reference range. (N)  marks values inside specified reference range. Reviewed and confirmed by Kristian Covey  2021-08-02T15:56:45       OTHER PROCEDURES       ALLERGIES   No Known Allergies    DISCHARGE MEDICATIONS     Current Discharge Medication List      START taking these medications    Details   furosemide (LASIX) 20 MG tablet Take 1 tablet (20 mg total) by mouth daily.  Qty: 30 tablet, Refills: 0    Associated Diagnoses: Atypical atrial flutter (CMS Dx)         CONTINUE these medications which have NOT CHANGED    Details   apixaban (ELIQUIS) 5 mg Tab Take 5 mg by mouth 2 times a day.      aspirin 81 MG EC tablet Take 81 mg by mouth daily.      atorvastatin (LIPITOR) 10 MG tablet Take 10 mg by mouth at bedtime.       carvedilol (COREG) 25 MG tablet Take 25 mg by mouth 2 (two) times daily.        cyanocobalamin (VITAMIN B-12) 100 MCG tablet Take 100 mcg by mouth daily.      ferrous sulfate 325 (65 FE) MG tablet Take 325 mg by mouth daily with breakfast.      glimepiride (AMARYL) 2 MG tablet Take 1 tablet by mouth  every morning before  breakfast      GLUC/CHON-MSM#1/C/MANG/BOS/BOR (OSTEO BI-FLEX ORAL) Take by mouth. OSTEO BI-FLEX JOINT SHIELD TABS       glyBURIDE-metformin (GLUCOVANCE) 2.5-500 mg per tablet Take 2 tablets by mouth 2 times a day.       levothyroxine (SYNTHROID, LEVOTHROID) 100 MCG tablet Take 100 mcg by mouth every morning.       omeprazole (PRILOSEC) 40 MG capsule Take 40 mg by mouth every morning.       propafenone (RYTHMOL) 225 MG tablet Take 225 mg by mouth 3 times a day.      tamsulosin (FLOMAX) 0.4 mg Cap Take 0.4 mg by mouth at bedtime.      valsartan (DIOVAN) 320 MG tablet Take 320 mg by mouth daily.  STOP taking these medications       losartan-hydrochlorothiazide (HYZAAR) 100-25 mg per tablet Comments:   Reason for Stopping:               DISPOSITION   home    DISCHARGE INSTRUCTIONS   Activity: activity as tolerated  Diet: cardiac diet    FOLLOW-UP   Call and schedule an appointment with your Primary Care Physician Marcello Fennel, MD within 1 week of discharge.    Call and schedule an  appointment with your Cardiology Physician Eda Keys , MD within 1 week of discharge.      Total time spent on this discharge was 30 minutes including time spent with the patient, reviewing data, completing medical records, and communication with the patient.    Thank you for the opportunity to be involved in your patient's care. If you have any questions or concerns regarding this hospitalization, please feel free to call me at [phone].    Electronically signed,  Al Pimple, MD  Pager: [phone]  11/14/2019  12:55 PM  [facility]

## 2019-11-14 NOTE — Unmapped (Signed)
[facility] Daily Progress Note    Subjective:  [name] is doing well today.    He continues to feel well  No acute events overnight  Denies any chest pain, dyspnea, leg edema  Walking halls without symptoms      1. Dyspnea on minimal exertion    2. Leg swelling    3. Tachypnea    4. Acute congestive heart failure, unspecified heart failure type (CMS Dx)    5. Essential hypertension    6. Atypical atrial flutter (CMS Dx)        Past Medical History:   Diagnosis Date   ??? Atrial fibrillation (CMS Dx)     corrected with Mini-maze procedure   ??? Cataract     left eye   ??? Diabetes mellitus (CMS Dx)     type 2   ??? Dilated cardiomyopathy (CMS Dx)    ??? Hearing loss     left ear   ??? Hypercholesteremia    ??? Hypertension    ??? Sleep apnea    ??? Thyroid disease     hypothyroidism       Current Facility-Administered Medications   Medication Dose Route Frequency Provider Last Rate Last Admin   ??? acetaminophen (TYLENOL) tablet 650 mg  650 mg Oral Q4H PRN Marissa Regan Rakers, DO       ??? apixaban (ELIQUIS) tablet 5 mg  5 mg Oral BID Truett Mainland, DO   5 mg at 11/14/19 1610   ??? aspirin EC tablet 81 mg  81 mg Oral Daily 0900 Truett Mainland, DO   81 mg at 11/14/19 9604   ??? atorvastatin (LIPITOR) tablet 10 mg  10 mg Oral Nightly (2100) Marissa Regan Rakers, DO   10 mg at 11/13/19 2006   ??? carvediloL (COREG) tablet 25 mg  25 mg Oral BID Truett Mainland, DO   25 mg at 11/14/19 5409   ??? dextrose 50 % in water (D50W) iv Syrg 25 mL  25 mL Intravenous Q15 Min PRN Marissa Regan Rakers, DO        Or   ??? dextrose 50 % in water (D50W) iv Syrg 50 mL  50 mL Intravenous Q15 Min PRN Marissa Regan Rakers, DO       ??? [START ON 11/15/2019] furosemide (LASIX) tablet 20 mg  20 mg Oral Daily 0900 Sherian Maroon, CNP       ??? glucose chewable tablet 12 g  12 g Oral Q15 Min PRN Marissa Regan Rakers, DO       ??? hydrALAZINE (APRESOLINE) tablet 10 mg  10 mg Oral Q6H PRN Mordecai Rasmussen., MD   10 mg at  11/13/19 0301   ??? insulin lispro (humaLOG) injection 0-4 Units  0-4 Units Subcutaneous Nightly (2100) Debria Garret, CNP   4 Units at 11/13/19 2128   ??? insulin lispro (humaLOG) injection 0-5 Units  0-5 Units Subcutaneous TID AC Marissa Regan Rakers, DO   5 Units at 11/14/19 1306   ??? levothyroxine (SYNTHROID) tablet 100 mcg  100 mcg Oral QAM Marissa Regan Rakers, DO   100 mcg at 11/14/19 8119   ??? magnesium hydroxide (MILK OF MAGNESIA) 2,400 mg/10 mL oral suspension 10 mL  10 mL Oral Daily PRN Truett Mainland, DO       ??? ondansetron (ZOFRAN) tablet 4 mg  4 mg Oral Q8H PRN Marissa Regan Rakers, DO        Or   ??? ondansetron [facility]) injection 4  mg  4 mg Intravenous Q8H PRN Marissa Regan Rakers, DO       ??? pantoprazole (PROTONIX) EC tablet 40 mg  40 mg Oral DAILY 0600 Truett Mainland, DO   40 mg at 11/14/19 0615   ??? propafenone (RYTHMOL) immediate release tablet 225 mg  225 mg Oral TID Truett Mainland, DO   225 mg at 11/14/19 1306   ??? tamsulosin (FLOMAX) capsule 0.4 mg  0.4 mg Oral Nightly (2100) Marissa Regan Rakers, DO   0.4 mg at 11/13/19 2005   ??? valsartan (DIOVAN) tablet 320 mg  320 mg Oral Daily 0900 Wyvonne Lenz Kise, CNP   320 mg at 11/14/19 1610     No Known Allergies  Active Problems:    * No active hospital problems. *        Objective:  Physical Exam:  Patient Vitals for the past 4 hrs:   BP Temp Temp src Pulse Resp SpO2   11/14/19 1054 143/59 97.6 ??F (36.4 ??C) Oral 60 16 96 %      Temp (24hrs), Avg:97.7 ??F (36.5 ??C), Min:97.5 ??F (36.4 ??C), Max:98 ??F (36.7 ??C)       Vitals:Blood pressure 143/59, pulse 60, temperature 97.6 ??F (36.4 ??C), temperature source Oral, resp. rate 16, height 6' (1.829 m), weight (!) 247 lb 8 oz (112.3 kg), SpO2 96 %.      GENERAL: No acute distress. Well developed, well nourished.     HEENT: Normocephalic, atraumatic.  Mucous membranes moist. Extraocular   muscles intact. Pupils equally round bilaterally.    NECK: Supple.  No jugular  venous distention, no lymphadenopathy, no carotid bruit.    CARDIOVASCULAR: Regular rate and rhythm.  S1 and S2 are normal. No murmurs, rubs or gallops.  Point of maximal intensity nondisplaced and nonsustained. No hepatojugular reflux. Capillary refill less than 2 seconds    RESPIRATORY: No respiratory distress. Diminished to auscultation bilaterally.  No rales/rhonchi/wheezes.      GASTROINTESTINAL:  No pulsatile masses.  Normal bowel sounds normal in all four quadrants.  Soft, nondistended, nontender.  No rebound or guarding.    EXTREMITIES: trace peripheral edema.  Palpable peripheral pulses. No cyanosis/clubbing.    NEURO: Cranial Nerve III through XII intact.  No Tremors. No focal deficit.    PSYCHIATRIC: Normal affect, insight, and speech. Alert and oriented x3 to person, place, and time.    SKIN: Intact. Normal skin turgor.  No rashes, no lesions, no erythema.     Date 11/13/19 0700 - 11/14/19 0659 11/14/19 0700 - 11/15/19 0659   Shift 0700-1459 1500-2259 2300-0659 24 Hour Total 0700-1459 1500-2259 2300-0659 24 Hour Total   INTAKE   P.O. 476 720  1196 400   400     P.O. 476 720  1196 400   400   I.V.(mL/kg)  0(0)  0(0)         I.V.  0  0       Other  0  0         Other  0  0       Shift Total(mL/kg) 476(4.3) 720(6.4)  1196(10.7) 400(3.6)   400(3.6)   OUTPUT   Urine(mL/kg/hr) 1750(2) 700(0.8)  2450(0.9) 2100   2100     Urine 1750 700  2450 2100   2100     Urine Occurrence  1 x  1 x 0 x   0 x   Emesis/NG output  0  0  Emesis  0  0         Emesis Occurrence  0 x  0 x 0 x   0 x   Stool  0  0         Stool Occurrence  0 x  0 x 0 x   0 x     Stool  0  0       Blood  0  0         Est Blood Loss  0  0         Blood  0  0       Intrauterine  0  0         Myosure Output  0  0       Shift Total(mL/kg) 1750(15.6) 700(6.3)  2450(21.8) 2100(18.7)   2100(18.7)   Weight (kg) 111.9 111.9 112.3 112.3 112.3 112.3 112.3 112.3       Cardiographics  ECG:  Echocardiogram:     Imaging  Radiology: X-ray Chest PA and  Lateral    Result Date: 11/09/2019  EXAM: XR CHEST PA AND LATERAL INDICATION: Dyspnea, unspecified,  dyspnea with exertion x 1 mth, hx of CHF, pls eval for acute CHF exacerbation, DATE: 11/09/2019 12:25 PM EDT COMPARISON: 03/07/2018 FINDINGS: The cardiac and mediastinal contours are within normal limits. There has been interval improvement in the previously seen bibasilar parenchymal opacities. Residual mild linear opacity likely represents residual linear scar. There is mild residual pleural thickening on the right. Pulmonary vascularity is within normal limits. The vascular markings remain distinct.     IMPRESSION: Interval improvement in the previously seen bibasilar opacities. No evidence of pulmonary edema. Report Verified by: Norberta Keens, MD at 11/09/2019 1:24 PM EDT      Lab Review   Lab Results   Component Value Date    TROPONINI <0.04 03/07/2018     Lab Results   Component Value Date    GLUCOSE 232 (H) 11/14/2019    BUN 23 11/14/2019    CO2 28 11/14/2019    CREATININE 1.10 11/14/2019    K 4.0 11/14/2019    NA 136 11/14/2019    CL 102 11/14/2019    CALCIUM 8.9 11/14/2019     Lab Results   Component Value Date    PHOS 3.6 05/12/2015     Lab Results   Component Value Date    WBC 10.1 11/10/2019    HGB 10.0 (L) 11/10/2019    HCT 30.6 (L) 11/10/2019    MCV 82.2 11/10/2019    PLT 146 11/10/2019     Lab Results   Component Value Date    INR 1.1 03/07/2018     Lab Results   Component Value Date    BNP 208 (H) 11/10/2019     Lab Results   Component Value Date    ALT 18 04/15/2008    AST 15 04/15/2008    ALKPHOS 83 04/15/2008    BILITOT 0.5 04/15/2008     Lab Results   Component Value Date    CHOLTOT 163 04/15/2008    TRIG 186 (H) 04/15/2008    HDL 31 (L) 04/15/2008    CHOLHDL 5.3 (H) 04/15/2008    LDL 95 04/15/2008     Lab Results   Component Value Date    HGBA1C 6.9 (H) 11/12/2019     Lab Results   Component Value Date    TSH 3.63 11/09/2019  Assessment/Plan:  [name] is a 79 yo gentleman with history of  nonobstructive CAD, atypical flutter s/p DCCV (11/08/2019), right atrial flutter s/p PVI and ganglionic plexus ablation, 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   ??  1. Exertional dyspnea/HFpEF: He still appears mildly volume up on exam today. Echocardiogram with normal LVEF. 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   -Was getting IV lasix, now euvolemic on exam   - Weight now 247lbs down from 255lbs   - continue carvedilol 25 mg bid, valsartan 320 mg daily    2. Paroxysmal Aflutter/fibrillation s/p recent DCCV. Currently in sinus rhythm. CHADsVasc score 4 consistent with annual stroke risk of 4.8%. Plan:   - continue apixaban 5 mg po bid   - continue propafenone 225 mg tid  ??  3. Nonobstructive CAD. Isolated episode of chest pain in the weeks prior. Cardiac enzymes are negative. Currently denies any chest pain   - continue optimal medical therapy: aspirin 81 mg daily, atorvastatin 10 mg nightly, carvedilol and valsartan as above   -Check fasting lipid    4. HTN: Improved.         Plan:  Transition to lasix 20mg  PO daily  Follow up in one week with Dr. Elson Clan        Thank you for including [place] in the patient's care. We will sign off. Please call with any questions.         Malyk Girouard L. Kizzie Bane, [facility]  [facility]  Pager [phone]  Pager [phone] After 5 PM and weekends

## 2019-11-14 NOTE — Unmapped (Signed)
[facility]  Progress Note    Admit Date: 11/09/2019    Patient: [name]  Date of service: 11/14/2019    Chief complaint, reason for follow up:     [name] is a 79 y.o. male on hospital day 2.  The principal reason for today's follow up visit is: Acute CHF exacerbation       Subjective:     I personally reviewed vitals, labs, staff/progress notes.    Interval History:     Feels significantly better.  He states that he has been a.m. be able to ambulate the hallway without feeling short of breath.  He has had some improvement of lower extremity swelling.  No further sensation of his heart racing.    Brief ROS:   General: No fever or chills.  ENT: no sore throat or nasal congestion  Cardiac: No chest pain or palpitations.  Pulm: No shortness of breath or wheezing.  GI: No abdominal pain, N/V.  Musculoskeletal: No leg edema or muscle pain.  Neuro: No dizziness/lightheadedness or HA.   GU: no dysuria or hematuria  Derm: no new rash, no skin lesions  Psych: no anxiety, no depression    Physical Exam:   BP 132/73 (BP Location: Left arm, Patient Position: Lying)    Pulse 79    Temp 97.6 ??F (36.4 ??C) (Oral)    Resp 16    Ht 6' (1.829 m)    Wt (!) 247 lb 8 oz (112.3 kg)    SpO2 96%    BMI 33.57 kg/m??   O2 Flow Rate (L/min): 0 L/min       Intake/Output Summary (Last 24 hours) at 11/14/2019 9604  Last data filed at 11/13/2019 2000  Gross per 24 hour   Intake 1196 ml   Output 2450 ml   Net -1254 ml       General Appearance:  Alert, cooperative, no distress   Head:  Normocephalic, without obvious abnormality, atraumatic        Throat:  Moist mucus membranes    Neck:  Supple, trachea midline   Lungs:  Clear to auscultation bilaterally, respirations unlabored    Heart:  Regular rate and rhythm, S1 and S2 normal   Abdomen:  Soft, non-tender, bowel sounds active all four quadrants   Extremities:  Extremities normal, trace to +1 pitting edema with wrinkled appearance   Pulses:  2+ and symmetric       Skin:  Warm and dry   Neurologic:  Face symmetric, speech fluent, no tremor       Current Meds:     Current Facility-Administered Medications   Medication Dose Frequency Provider Last Admin   ??? acetaminophen  650 mg Q4H PRN Marissa Regan Rakers, DO     ??? apixaban  5 mg BID Truett Mainland, DO 5 mg at 11/13/19 2005   ??? aspirin  81 mg Daily 0900 Truett Mainland, DO 81 mg at 11/13/19 5409   ??? atorvastatin  10 mg Nightly (2100) Marissa Regan Rakers, DO 10 mg at 11/13/19 2006   ??? carvediloL  25 mg BID Truett Mainland, DO 25 mg at 11/13/19 2006   ??? dextrose 50 % in water (D50W)  25 mL Q15 Min PRN Marissa Regan Rakers, DO      Or   ??? dextrose 50 % in water (D50W)  50 mL Q15 Min PRN Marissa Regan Rakers, DO     ??? furosemide (LASIX) injection  40 mg Daily 0900 Sherian Maroon, CNP 40 mg at 11/13/19 1051   ??? glucose  12 g Q15 Min PRN Marissa Regan Rakers, DO     ??? hydrALAZINE (APRESOLINE) oral tablet  10 mg Q6H PRN Mordecai Rasmussen., MD 10 mg at 11/13/19 0301   ??? insulin lispro  0-4 Units Nightly (2100) Debria Garret, CNP 4 Units at 11/13/19 2128   ??? insulin lispro  0-5 Units TID [facility] Marissa Regan Rakers, DO 2 Units at 11/13/19 1731   ??? levothyroxine  100 mcg QAM Truett Mainland, DO 100 mcg at 11/13/19 1610   ??? magnesium hydroxide  10 mL Daily PRN Truett Mainland, DO     ??? ondansetron  4 mg Q8H PRN Truett Mainland, DO      Or   ??? ondansetron  4 mg Q8H PRN Truett Mainland, DO     ??? pantoprazole  40 mg DAILY 0600 Truett Mainland, DO 40 mg at 11/14/19 0615   ??? propafenone  225 mg TID Truett Mainland, DO 225 mg at 11/13/19 2005   ??? tamsulosin  0.4 mg Nightly (2100) Marissa Regan Rakers, DO 0.4 mg at 11/13/19 2005   ??? valsartan  320 mg Daily 0900 Wyvonne Lenz Kise, CNP 320 mg at 11/13/19 9604          Labs:   No results for input(s): WBC, HGB, HCT, PLT in the last 72 hours.                                                                Recent Labs     11/12/19  0537 11/13/19  0619 11/14/19  0632   NA 139 135 136   K 4.4 4.2 4.0   CL 102 102 102   CO2 32 28 28   BUN 21 20 23    CREATININE 1.17 1.06 1.10   GLUCOSE 200* 223* 232*     No results for input(s): INR in the last 72 hours.              Component Value Date/Time    POCGMD 209 (H) 11/14/2019 0351    POCGMD 350 (H) 11/13/2019 2049    POCGMD 209 (H) 11/13/2019 1730    POCGMD 271 (H) 11/13/2019 1203    POCGMD 299 (H) 11/13/2019 0911        Assessment and Plan:   [name] is a 79 y.o. male      On hospital day 2.   Current problems include:      Acute CHF exacerbation  Echocardiogram 11/11/2019 with ejection fraction of 55 to 60%  Continue diuresis : Lasix 40 mg IV daily  Negative fluid balance of 1254  Questionable weight of admission weight of 248 versus 255 pounds  Weight this morning 247 pounds  Continue valsartan 320 mg p.o. daily     History of A. Fib//flutter  Status post ablation and mini maze procedure  Currently normal sinus rhythm and rate controlled  Continue carvedilol 25 mg p.o. twice daily  Continue apixaban 5 mg p.o. twice daily  Continue propafenone 225 p.o. 3 times daily    Hypothyroidism   Continue Synthroid 100 mcg p.o. daily    Hyperlipidemia  Continue atorvastatin 10 mg  p.o. nightly    BPH  Continue tamsulosin 0.5 mg p.o. nightly      Disposition:     Pending evaluation by cardiology  Question DC 8/528/6    Electronically signed,  Al Pimple MD  Pager: [phone]  11/14/2019  8:34 AM  [facility]

## 2019-11-22 MED ORDER — METFORMIN HCL 500 MG PO TABS
500 MG | ORAL_TABLET | ORAL | 1 refills | Status: DC
Start: 2019-11-22 — End: 2020-05-01

## 2019-11-22 NOTE — Telephone Encounter (Signed)
Medication:   Requested Prescriptions     Pending Prescriptions Disp Refills   ??? metFORMIN (GLUCOPHAGE) 500 MG tablet [Pharmacy Med Name: METFORMIN TAB 500MG ] 180 tablet 1     Sig: TAKE 1 TABLET TWICE DAILY  WITH MEALS     Last Filled:  06/17/19    Last appt: 09/25/2019   Next appt: 03/27/2020    Last OARRS: No flowsheet data found.

## 2020-01-09 NOTE — Unmapped (Signed)
Called [name] to reschedule appointment LM with wife to reschedule appointment

## 2020-01-29 ENCOUNTER — Ambulatory Visit: Admit: 2020-01-29 | Discharge: 2020-01-29 | Payer: MEDICARE | Attending: Internal Medicine | Primary: Internal Medicine

## 2020-01-29 DIAGNOSIS — M545 Low back pain, unspecified: Secondary | ICD-10-CM

## 2020-01-29 MED ORDER — METHOCARBAMOL 500 MG PO TABS
500 MG | ORAL_TABLET | Freq: Three times a day (TID) | ORAL | 0 refills | Status: AC
Start: 2020-01-29 — End: 2020-02-08

## 2020-01-29 MED ORDER — LIDOCAINE 5 % EX PTCH
5 % | MEDICATED_PATCH | Freq: Every day | CUTANEOUS | 0 refills | Status: AC
Start: 2020-01-29 — End: 2020-02-28

## 2020-01-29 MED ORDER — METHYLPREDNISOLONE 4 MG PO TBPK
4 MG | PACK | ORAL | 0 refills | Status: DC
Start: 2020-01-29 — End: 2021-03-11

## 2020-01-29 NOTE — Progress Notes (Signed)
Subjective  Back Pain: Patient presents for presents evaluation of low back problems.  Symptoms have been present for 3- 4 weeks and include lower back, both sides, radiates to the rt leg, dull aching, continuous pain, worse when he walks, . Pain radiates to the lower extremities. Initial inciting event: none. Symptoms are worst: morning, mid-day, afternoon, evening, nighttime. Alleviating factors identifiable by patient are none. No bladder or bowel incontinence. Some paresthesias reported in he lower extremities. Exacerbating factors identifiable by patient are bending backwards, bending forwards and walking. Treatments so far initiated by patient: otc meds,  Previous lower back problems: none. Previous workup: none. Previous treatments: none.  Went to chiropractor three times , didn't help,   No Known Allergies   Objective  BP (!) 140/83 (Site: Right Upper Arm, Position: Sitting, Cuff Size: Medium Adult)    Pulse 73    Temp 97.3 ??F (36.3 ??C)    Resp 14    Wt 255 lb (115.7 kg)    SpO2 99%    BMI 34.58 kg/m??    Afebrile. Cardiovascular exam normal. Lungs are clear. Abdominal exam reveals no flank tenderness.Back exam shows paraspinal muscle tenderness in the lower lumbar region on both sides. There is decreased range of motion in flexion.  Lateral movement and rotation of the spine are intact.  Straight leg raising is positive bilaterally.  No sensory or motor deficit is noted.  Deep tendon reflexes are 2+ and equal bilaterally.  Assessment  Low back pain  Lumbosacral radiculopathy.  Plan  Explained to the patient the possible causes of lumbar radiculopathy, including sciatica and disc herniations. Conservative treatment is recommended initially. Back stretching was reviewed with the patient, and will be performed twice daily.  Moist heat locally.  Back protective techniques reviewed, along with sleep positioning. Start antiinflammatories, muscle relaxants and pain medications. Consider Physical Therapy and XRay  studies or even MRI if not improving. Call or return to clinic prn if these symptoms worsen or fail to improve as anticipated.

## 2020-02-05 ENCOUNTER — Ambulatory Visit: Admit: 2020-02-05 | Discharge: 2020-02-05 | Payer: MEDICARE | Attending: Internal Medicine | Primary: Internal Medicine

## 2020-02-05 DIAGNOSIS — M5441 Lumbago with sciatica, right side: Secondary | ICD-10-CM

## 2020-02-05 NOTE — Progress Notes (Signed)
Subjective  Back Pain: Patient presents for presents evaluation of low back problems.  Symptoms have been present for 3- 4 weeks and include lower back, both sides, radiates to the rt leg, dull aching, continuous pain, worse when he walks, . Pain radiates to the lower extremities. Initial inciting event: none. Symptoms are worst: morning, mid-day, afternoon, evening, nighttime. Alleviating factors identifiable by patient are none. No bladder or bowel incontinence. Some paresthesias reported in he lower extremities. Exacerbating factors identifiable by patient are bending backwards, bending forwards and walking. Treatments so far initiated by patient: otc meds,  Previous lower back problems: none. Previous workup: none. Previous treatments: none.  Went to chiropractor three times , didn't help,   No Known Allergies   Also c/o double vision, saw an eye doctor. Was told that everything was normal,    Objective  BP (!) 140/86 (Site: Left Upper Arm, Position: Sitting, Cuff Size: Medium Adult)    Pulse 77    Temp 97 ??F (36.1 ??C)    Resp 14    Wt 250 lb (113.4 kg)    SpO2 99%    BMI 33.91 kg/m??    Afebrile. Cardiovascular exam normal. Lungs are clear. Abdominal exam reveals no flank tenderness.Back exam shows paraspinal muscle tenderness in the lower lumbar region on both sides. There is decreased range of motion in flexion.  Lateral movement and rotation of the spine are intact.  Straight leg raising is positive bilaterally.  No sensory or motor deficit is noted.  Deep tendon reflexes are 2+ and equal bilaterally.  Assessment  Low back pain worsening.   Lumbosacral radiculopathy.  Diplopia.   Plan  Explained to the patient the possible causes of lumbar radiculopathy, including sciatica and disc herniations. Conservative treatment is recommended initially. Back stretching was reviewed with the patient, and will be performed twice daily.  Moist heat locally.  Back protective techniques reviewed, along with sleep  positioning. Start antiinflammatories, muscle relaxants and pain medications. Will order MRI. . Call or return to clinic prn if these symptoms worsen or fail to improve as anticipated.   Will refer to neurology.

## 2020-02-12 ENCOUNTER — Inpatient Hospital Stay: Admit: 2020-02-12 | Discharge: 2020-02-12 | Payer: MEDICARE | Primary: Internal Medicine

## 2020-02-12 DIAGNOSIS — M5442 Lumbago with sciatica, left side: Secondary | ICD-10-CM

## 2020-02-13 ENCOUNTER — Telehealth

## 2020-02-13 NOTE — Telephone Encounter (Signed)
-----   Message from Thompson Caul sent at 02/12/2020  2:09 PM EDT -----  Subject: Message to Provider    QUESTIONS  Information for Provider? Patient would like PCP to call him to go over   test results   ---------------------------------------------------------------------------  --------------  CALL BACK INFO  What is the best way for the office to contact you? OK to leave message on   voicemail  Preferred Call Back Phone Number? [phone]  ---------------------------------------------------------------------------  --------------  SCRIPT ANSWERS  Relationship to Patient? Self

## 2020-02-13 NOTE — Telephone Encounter (Signed)
Informed patient result,referral has been faxed to [facility]

## 2020-02-13 NOTE — Telephone Encounter (Signed)
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 And there is compression of nerves at several levels. That's all I know. We can mail him a copy of the MRI. If he wants to further talk about this, he can come in for an appointment. He needs to see neurosurgery to discuss treatment options.

## 2020-02-24 MED ORDER — SITAGLIPTIN PHOSPHATE 100 MG PO TABS
100 MG | ORAL_TABLET | ORAL | 3 refills | Status: DC
Start: 2020-02-24 — End: 2021-04-06

## 2020-02-24 MED ORDER — LEVOTHYROXINE SODIUM 100 MCG PO TABS
100 MCG | ORAL_TABLET | ORAL | 3 refills | Status: DC
Start: 2020-02-24 — End: 2021-06-21

## 2020-02-24 MED ORDER — GLIMEPIRIDE 2 MG PO TABS
2 MG | ORAL_TABLET | ORAL | 3 refills | Status: DC
Start: 2020-02-24 — End: 2021-04-06

## 2020-02-24 NOTE — Telephone Encounter (Signed)
Medication:   Requested Prescriptions     Pending Prescriptions Disp Refills   ??? levothyroxine (SYNTHROID) 100 MCG tablet [Pharmacy Med Name: SYNTHROID TAB 100MCG] 90 tablet 3     Sig: TAKE 1 TABLET DAILY   ??? SITagliptin (JANUVIA) 100 MG tablet [Pharmacy Med Name: JANUVIA TAB 100MG ] 90 tablet 3     Sig: TAKE 1 TABLET DAILY   ??? glimepiride (AMARYL) 2 MG tablet [Pharmacy Med Name: GLIMEPIRIDE  TAB 2MG ] 180 tablet 3     Sig: TAKE 1 TABLET TWICE DAILY  WITH MEALS         Last appt: 02/05/2020   Next appt: 03/27/2020    Last Labs DM:   Lab Results   Component Value Date    LABA1C 6.9 11/12/2019

## 2020-03-24 NOTE — Progress Notes (Unsigned)
History of Present Illness  Carl Blair is a 79 year old male who is here today because of low back   pain.  He is being seen in consultation at the request of Dr. Sheliah Plane.  The   onset of this problem was gradual, and the symptoms have been occurring in a   persistent pattern for 6 months.  The course has been gradually worsening.    The severity of the pain is reported to be moderate to severe, and it radiates   to both legs, R>>L.  The pain is described as being located lower back, left   hip, left buttock, left upper leg, right hip, right buttock, and right upper   leg.  The pain is characterized as an aching sensation and shooting.  The pain   is relieved by lying down, modification of activity, rest, and sitting and is   aggravated by prolonged standing and walking.  Previous evaluations include   chiropractic, MRI, and primary care physician.          Review of Histories and Systems   I reviewed the patient's past medical history, social history, family history,   and review of systems. The patient recorded this information in our chart and   I reviewed it personally.      Physical Exam   General   General appearance: well groomed, cooperative  Build and nutrition: well developed, well nourished  Posture: shoulders rounded    Eyes  Visual fields: grossly intact by confrontation  EOM: extraocular movements intact, normal gaze alignment      Musculoskeletal   Gait and station: normal  Cervical ROM: normal  Lumbar skin: normal  Lumbar strength: normal  Lumbar ROM: extension restricted, extension painful  Lumbar palpation/inspection: mild tenderness  Straight leg raise right: negative  Straight leg raise left: negative  RUE muscle strength: 5/5 strength throughout  RUE muscle tone: normal  LUE muscle strength: 5/5 strength throughout  LUE muscle tone: normal  RLE muscle strength: 5/5 throughout  RLE muscle tone: normal  LLE muscle strength: 5/5 throughout  LLE muscle tone: normal      Neurologic   DTR Right:  normal except achilles  patellar 0/2  DTR Left: normal except achilles  patellar 0/2  Cerebellar function: normal    Sensory   Light touch: normal  Paresthesias: none    Cranial Nerves  5th (Trigeminal): normal corneal reflex  11th (Spinal access): no atrophy, normal strength bilaterally    Mental Status Exam   Affect: normal  Orientation: oriented to time, place, and person  Attention span/Concentration/Cognitive function: all normal  Memory: normal  Speech/language: normal  Fund of knowledge: able to name months, seasons, current president  Thought content: normal        Assessment   New Problems:   SYNOVIAL CYST (ICD-727.40) (ICD10-M71.38)  LUMBAR STENOSIS, 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  Symptoms are aggravated by   standing and walking and improved with sitting or lying down.  He was trialed   on oral steroids and antispasmodic's without relief coordinated through his   PCP.  He saw the chiropractor 4 times without relief.  Therefore lumbar MRI   was obtained and he is referred for  neurosurgical opinion.    Personal review of lumbar MRI shows facet arthropathy and disc bulging   resulting in stenosis greatest at L3-4 as well as L2-3 and L4-5.  He also has   a large right-sided synovial cyst at L3-4 contributing to the stenosis.      I discussed with the patient treatment options which include continued   modification of activity to tolerate symptoms, palliative injections or as a   last resort surgical intervention.  Patient in agreement with trying one   injection with cyst aspirate.  If symptoms remain then he would need   appointment with Dr. Gillian Shields for surgical opinion.  Patient will need dynamic   lumbar  x-rays prior to appointment.        Plan   New Orders:  X-Ray Lumbar - AP/Lateral & Flexion/Extension [72100, 72120]  ESI x 1 injection & follow up with ordering MD [[id number]]  Disposition: follow-up          Electronically Signed by Arby Barrette NP on 03/26/2020 at 12:42 PM  ________________________________________________________________________

## 2020-03-25 ENCOUNTER — Inpatient Hospital Stay: Admit: 2020-03-25 | Payer: Medicare (Managed Care)

## 2020-03-25 DIAGNOSIS — Q7649 Other congenital malformations of spine, not associated with scoliosis: Secondary | ICD-10-CM

## 2020-03-27 ENCOUNTER — Encounter

## 2020-03-27 ENCOUNTER — Ambulatory Visit: Admit: 2020-03-27 | Discharge: 2020-03-27 | Payer: MEDICARE | Attending: Internal Medicine | Primary: Internal Medicine

## 2020-03-27 DIAGNOSIS — Z0000 Encounter for general adult medical examination without abnormal findings: Secondary | ICD-10-CM

## 2020-03-27 NOTE — Assessment & Plan Note (Signed)
Patient counseled,   Encouraged to lose weight, watch diet and exercise consistently.

## 2020-03-27 NOTE — Assessment & Plan Note (Signed)
On eliquis,  Rate controlled,  Patient is compliant w medications, no side effects, effective, provides adequate symptom relief. No new symptoms or problems as noted by patient.  The problem is stable, no changes noted by patient. Will consider monitoring labs and refill medications as appropriate. Patient counseled and will continue current plan.

## 2020-03-27 NOTE — Assessment & Plan Note (Signed)
On synthroid,  Patient is compliant w medications, no side effects, effective, provides adequate symptom relief. No new symptoms or problems as noted by patient.  The problem is stable, no changes noted by patient. Will consider monitoring labs and refill medications as appropriate. Patient counseled and will continue current plan.

## 2020-03-27 NOTE — Assessment & Plan Note (Signed)
This is a chronic problem. The problem is well controlled.  Patient monitors readings regularly. Pertinent negatives include no chest pain, focal sensory loss, focal weakness, leg pain, myalgias or shortness of breath. No headaches or chest pain. Takes medications regularly.  Blood pressure has been stable, blood work was reviewed, and advised patient to continue the current instructions or medications.

## 2020-03-27 NOTE — Patient Instructions (Signed)
Personalized Preventive Plan for Carl Blair - 03/27/2020  Medicare offers a range of preventive health benefits. Some of the tests and screenings are paid in full while other may be subject to a deductible, co-insurance, and/or copay.    Some of these benefits include a comprehensive review of your medical history including lifestyle, illnesses that may run in your family, and various assessments and screenings as appropriate.    After reviewing your medical record and screening and assessments performed today your provider may have ordered immunizations, labs, imaging, and/or referrals for you.  A list of these orders (if applicable) as well as your Preventive Care list are included within your After Visit Summary for your review.    Other Preventive Recommendations:    ?? A preventive eye exam performed by an eye specialist is recommended every 1-2 years to screen for glaucoma; cataracts, macular degeneration, and other eye disorders.  ?? A preventive dental visit is recommended every 6 months.  ?? Try to get at least 150 minutes of exercise per week or 10,000 steps per day on a pedometer .  ?? Order or download the FREE "Exercise & Physical Activity: Your Everyday Guide" from [employer]. Call 351-294-5013 or search [employer] online.  ?? You need 1200-1500 mg of calcium and 1000-2000 IU of vitamin D per day. It is possible to meet your calcium requirement with diet alone, but a vitamin D supplement is usually necessary to meet this goal.  ?? When exposed to the sun, use a sunscreen that protects against both UVA and UVB radiation with an SPF of 30 or greater. Reapply every 2 to 3 hours or after sweating, drying off with a towel, or swimming.  ?? Always wear a seat belt when traveling in a car. Always wear a helmet when riding a bicycle or motorcycle.

## 2020-03-27 NOTE — Assessment & Plan Note (Signed)
Diabetes Mellitus Type II:  Home blood sugar records reviewed: fasting range: 120-130.  No significant episodes of hypoglycemia. Compliant with medications.  No polyuria, polydipsia, visual changes, foot problems, GI upset.  Diabetic diet compliance:  compliant most of the time.  Current exercise: none. Will check labs, and refill medications as appropriate.    Hypertension:  Denies CP, SOB, visual changes, dizziness, palpitations or HA.  He is adherent to a low sodium diet.  Blood pressure typically runs ok outside of the office. Continue current medications.    Hyperlipidemia:  No new myalgias or GI upset on current medications.       Lab Results   Component Value Date    LABA1C 6.9 (H) 11/12/2019    LABA1C 6.8 09/25/2019    LABA1C 6.6 05/07/2019     Lab Results   Component Value Date    LABMICR 2.60 (H) 05/07/2019    CREATININE 1.17 11/12/2019     Lab Results   Component Value Date    ALT 11 05/07/2019    AST 14 (L) 05/07/2019     No components found for: CHLPL  Lab Results   Component Value Date    TRIG 73 05/07/2019     Lab Results   Component Value Date    HDL 30 (L) 05/07/2019     Lab Results   Component Value Date    LDLCALC 51 05/07/2019      This problem was reviewed in detail. It is under control and stable. Will check blood work.

## 2020-03-27 NOTE — Progress Notes (Signed)
Medicare Annual Wellness Visit  Name: Carl Blair Today???s Date: 03/27/2020   MRN: [id number] Sex: Male   Age: 79 y.o. Ethnicity: Non-Hispanic / Non Latino   DOB: August 04, 1940 Race: White (non-Hispanic)      Carl Blair is here for Medicare AWV    Screenings for behavioral, psychosocial and functional/safety risks, and cognitive dysfunction are all negative except as indicated below. These results, as well as other patient data from the Health Risk Assessment form, are documented in Flowsheets linked to this Encounter.    No Known Allergies    Prior to Visit Medications    Medication Sig Taking? Authorizing Provider   levothyroxine (SYNTHROID) 100 MCG tablet TAKE 1 TABLET DAILY Yes Marcello Fennel, MD   SITagliptin (JANUVIA) 100 MG tablet TAKE 1 TABLET DAILY Yes Marcello Fennel, MD   glimepiride (AMARYL) 2 MG tablet TAKE 1 TABLET TWICE DAILY  WITH MEALS Yes Marcello Fennel, MD   metFORMIN (GLUCOPHAGE) 500 MG tablet TAKE 1 TABLET TWICE DAILY  WITH MEALS Yes Marcello Fennel, MD   carvedilol (COREG) 25 MG tablet TAKE 1 TABLET BY MOUTH TWO  TIMES DAILY Yes Marcello Fennel, MD   omeprazole (PRILOSEC) 40 MG delayed release capsule TAKE 1 CAPSULE BY MOUTH  DAILY Yes Marcello Fennel, MD   atorvastatin (LIPITOR) 20 MG tablet TAKE 1 TABLET BY MOUTH  DAILY Yes Marcello Fennel, MD   tamsulosin (FLOMAX) 0.4 MG capsule Take 0.4 mg by mouth daily  Yes Historical Provider, MD   blood glucose test strips (ACCU-CHEK AVIVA PLUS) strip TEST BLOOD SUGAR ONCE DAILY dx E11.9 Yes Marcello Fennel, MD   losartan-hydrochlorothiazide (HYZAAR) 100-25 MG per tablet Take 1 tablet by mouth daily Yes Marcello Fennel, MD   propafenone (RYTHMOL) 300 MG tablet Take 150 mg by mouth 3 times daily  Yes [name], MD   Misc Natural Products (OSTEO BI-FLEX JOINT SHIELD PO) Take by mouth daily Yes Historical Provider, MD   apixaban (ELIQUIS) 5 MG TABS tablet Take by mouth 2 times daily Yes Historical Provider, MD   ACCU-CHEK  FASTCLIX LANCETS MISC Test once daily and prn dx 250.00 Yes [name], MD   Blood Glucose Monitoring Suppl DEVI by Does not apply route. Diagnosis   250.00 Yes [name], MD   aspirin 81 MG EC tablet  Yes Historical Provider, MD   methylPREDNISolone (MEDROL DOSEPACK) 4 MG tablet Take by mouth.  Marcello Fennel, MD   metFORMIN (GLUCOPHAGE) 1000 MG tablet TAKE 1 TABLET BY MOUTH  TWICE A DAY WITH MEALS  Marcello Fennel, MD   oxybutynin (DITROPAN-XL) 10 MG extended release tablet Take 10 mg by mouth daily   Historical Provider, MD       Past Medical History:   Diagnosis Date   ??? Atrial fibrillation (HCC)    ??? Cardiomyopathy    ??? Chronic a-fib (HCC) 09/06/2017   ??? Hyperlipidemia    ??? Hypertension    ??? Hypothyroidism    ??? 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 Type II or unspecified type diabetes mellitus without mention of complication, not stated as uncontrolled    ??? Unspecified sleep apnea        Past Surgical History:   Procedure Laterality Date   ??? CARDIAC SURGERY  2009    mini maze   ??? CARPAL TUNNEL RELEASE Left 2016   ??? COLONOSCOPY N/A 06/11/2019    COLONOSCOPY DIAGNOSTIC performed by Irish Elders, MD at  [facility]   ??? HERNIA REPAIR      Right   ??? SEPTOPLASTY     ??? UPPER GASTROINTESTINAL ENDOSCOPY N/A 06/11/2019    ENTEROSCOPY PUSH BIOPSY performed by Irish Elders, MD at [facility]   ??? UPPER GASTROINTESTINAL ENDOSCOPY N/A 06/11/2019    ENTEROSCOPY WITH INTERVENTION performed by Irish Elders, MD at [facility]   ??? UVULOPALATOPHARYGOPLASTY         Family History   Problem Relation Age of Onset   ??? Diabetes Mother    ??? Hypertension Mother    ??? Diabetes Sister    ??? Coronary Art Dis Sister    ??? Diabetes Brother    ??? Coronary Art Dis Brother    ??? Hypertension Brother    ??? Asthma Sister        CareTeam (Including outside providers/suppliers regularly involved in providing care):   Patient Care Team:  Marcello Fennel, MD as PCP - General (Internal Medicine)  Marcello Fennel, MD as PCP - [facility] Empaneled  Provider    Wt Readings from Last 3 Encounters:   03/27/20 254 lb (115.2 kg)   02/05/20 250 lb (113.4 kg)   01/29/20 255 lb (115.7 kg)     Vitals:    03/27/20 0922   BP: 124/74   Site: Left Upper Arm   Position: Sitting   Cuff Size: Large Adult   Pulse: 74   Resp: 12   Temp: 98 ??F (36.7 ??C)   TempSrc: Temporal   SpO2: 99%   Weight: 254 lb (115.2 kg)     Body mass index is 34.45 kg/m??.    Based upon direct observation of the patient, evaluation of cognition reveals recent and remote memory intact.    General Appearance: alert and oriented to person, place and time, well developed and well- nourished, in no acute distress  Skin: warm and dry, no rash or erythema  Head: normocephalic and atraumatic  Eyes: pupils equal, round, and reactive to light, extraocular eye movements intact, conjunctivae normal  ENT: tympanic membrane, external ear and ear canal normal bilaterally, nose without deformity, nasal mucosa and turbinates normal without polyps  Neck: supple and non-tender without mass, no thyromegaly or thyroid nodules, no cervical lymphadenopathy  Pulmonary/Chest: clear to auscultation bilaterally- no wheezes, rales or rhonchi, normal air movement, no respiratory distress  Cardiovascular: normal rate, regular rhythm, normal S1 and S2, no murmurs, rubs, clicks, or gallops, distal pulses intact, no carotid bruits  Abdomen: soft, non-tender, non-distended, normal bowel sounds, no masses or organomegaly  Extremities: no cyanosis, clubbing or edema  Musculoskeletal: normal range of motion, no joint swelling, deformity or tenderness  Neurologic: reflexes normal and symmetric, no cranial nerve deficit, gait, coordination and speech normal    Patient's complete Health Risk Assessment and screening values have been reviewed and are found in Flowsheets. The following problems were reviewed today and where indicated follow up appointments were made and/or referrals ordered.    Positive Risk Factor Screenings with Interventions:           General Health and ACP:  General  In general, how would you say your health is?: Fair  In the past 7 days, have you experienced any of the following? New or Increased Pain, New or Increased Fatigue, Loneliness, Social Isolation, Stress or Anger?: None of These  Do you get the social and emotional support that you need?: (!) No  Do you have a Living Will?: Yes  Advance Directives  Power of [employer] Wholesal[employer]orney Living Will ACP-Advance Directive ACP-Power of Attorney    Not on PocahontasFile Filed on 06/04/15 Mosetta PigeonFiled Filed      General Health Risk Interventions:  ?? no issues    Health Habits/Nutrition:  Health Habits/Nutrition  Do you exercise for at least 20 minutes 2-3 times per week?: (!) No  Have you lost any weight without trying in the past 3 months?: No  Do you eat only one meal per day?: No  Have you seen the dentist within the past year?: Yes     Health Habits/Nutrition Interventions:  ?? no issues       Personalized Preventive Plan   Current Health Maintenance Status  Immunization History   Administered Date(s) Administered   ??? COVID-19, Pfizer, PF, 5930mcg/0.3mL 06/15/2019, 07/07/2019, 02/17/2020   ??? Influenza Vaccine, unspecified formulation 01/10/2015   ??? Influenza Virus Vaccine 01/20/2007, 01/16/2011, 12/20/2011, 02/11/2013, 01/08/2014   ??? Influenza Whole 01/05/2010   ??? Influenza, High Dose (Fluzone 65 yrs and older) 01/04/2016, 01/09/2017, 12/25/2017   ??? Pneumococcal Conjugate 13-valent (Prevnar13) 01/08/2014, 12/25/2017   ??? Pneumococcal Polysaccharide (Pneumovax23) 05/31/2002, 06/04/2015   ??? Td, unspecified formulation 11/02/2009   ??? Tdap (Boostrix, Adacel) 07/23/2017   ??? Zoster Live (Zostavax) 06/06/2014   ??? Zoster Recombinant (Shingrix) 10/09/2016, 01/31/2018, 04/02/2018        Health Maintenance   Topic Date Due   ??? Annual Wellness Visit (AWV)  Never done   ??? Diabetic foot exam  05/06/2020   ??? Lipid screen  05/06/2020   ??? TSH testing  05/06/2020   ??? A1C test (Diabetic or Prediabetic)  05/14/2020   ??? Diabetic  retinal exam  10/03/2020   ??? Potassium monitoring  11/11/2020   ??? Creatinine monitoring  11/11/2020   ??? DTaP/Tdap/Td vaccine (2 - Td or Tdap) 07/24/2027   ??? Flu vaccine  Completed   ??? Shingles Vaccine  Completed   ??? Pneumococcal 65+ years Vaccine  Completed   ??? COVID-19 Vaccine  Completed   ??? Hepatitis C screen  Completed   ??? Hepatitis A vaccine  Aged Out   ??? Hib vaccine  Aged Out   ??? Meningococcal (ACWY) vaccine  Aged Out     Recommendations for Preventive Services Due: see orders and patient instructions/AVS.  .  Recommended screening schedule for the next 5-10 years is provided to the patient in written form: see Patient Instructions/AVS.      Established patient here for a visit to manage acute and chronic medical conditions as detailed below.    Type 2 diabetes mellitus without complication, without long-term current use of insulin (HCC)  Diabetes Mellitus Type II:  Home blood sugar records reviewed: fasting range: 120-130.  No significant episodes of hypoglycemia. Compliant with medications.  No polyuria, polydipsia, visual changes, foot problems, GI upset.  Diabetic diet compliance:  compliant most of the time.  Current exercise: none. Will check labs, and refill medications as appropriate.    Hypertension:  Denies CP, SOB, visual changes, dizziness, palpitations or HA.  He is adherent to a low sodium diet.  Blood pressure typically runs ok outside of the office. Continue current medications.    Hyperlipidemia:  No new myalgias or GI upset on current medications.       Lab Results   Component Value Date    LABA1C 6.9 (H) 11/12/2019    LABA1C 6.8 09/25/2019    LABA1C 6.6 05/07/2019     Lab Results   Component Value Date  LABMICR 2.60 (H) 05/07/2019    CREATININE 1.17 11/12/2019     Lab Results   Component Value Date    ALT 11 05/07/2019    AST 14 (L) 05/07/2019     No components found for: CHLPL  Lab Results   Component Value Date    TRIG 73 05/07/2019     Lab Results   Component Value Date    HDL 30 (L)  05/07/2019     Lab Results   Component Value Date    LDLCALC 51 05/07/2019      This problem was reviewed in detail. It is under control and stable. Will check blood work.       Essential hypertension  This is a chronic problem. The problem is well controlled.  Patient monitors readings regularly. Pertinent negatives include no chest pain, focal sensory loss, focal weakness, leg pain, myalgias or shortness of breath. No headaches or chest pain. Takes medications regularly.  Blood pressure has been stable, blood work was reviewed, and advised patient to continue the current instructions or medications.       Morbid obesity due to excess calories [facility])  Patient counseled,   Encouraged to lose weight, watch diet and exercise consistently.       Acquired hypothyroidism  On synthroid,  Patient is compliant w medications, no side effects, effective, provides adequate symptom relief. No new symptoms or problems as noted by patient.  The problem is stable, no changes noted by patient. Will consider monitoring labs and refill medications as appropriate. Patient counseled and will continue current plan.      Chronic systolic congestive heart failure (HCC)  On lasix, ace-I and coreg,  Patient is compliant w medications, no side effects, effective, provides adequate symptom relief. No new symptoms or problems as noted by patient.  The problem is stable, no changes noted by patient. Will consider monitoring labs and refill medications as appropriate. Patient counseled and will continue current plan.      Chronic a-fib (HCC)  On eliquis,  Rate controlled,  Patient is compliant w medications, no side effects, effective, provides adequate symptom relief. No new symptoms or problems as noted by patient.  The problem is stable, no changes noted by patient. Will consider monitoring labs and refill medications as appropriate. Patient counseled and will continue current plan.

## 2020-03-27 NOTE — Assessment & Plan Note (Signed)
On lasix, ace-I and coreg,  Patient is compliant w medications, no side effects, effective, provides adequate symptom relief. No new symptoms or problems as noted by patient.  The problem is stable, no changes noted by patient. Will consider monitoring labs and refill medications as appropriate. Patient counseled and will continue current plan.

## 2020-03-28 LAB — COMPREHENSIVE METABOLIC PANEL
ALT: 10 U/L (ref 10–40)
AST: 11 U/L — ABNORMAL LOW (ref 15–37)
Albumin/Globulin Ratio: 1.7 (ref 1.1–2.2)
Albumin: 4 g/dL (ref 3.4–5.0)
Alkaline Phosphatase: 129 U/L (ref 40–129)
Anion Gap: 17 — ABNORMAL HIGH (ref 3–16)
BUN: 25 mg/dL — ABNORMAL HIGH (ref 7–20)
CO2: 24 mmol/L (ref 21–32)
Calcium: 9.3 mg/dL (ref 8.3–10.6)
Chloride: 99 mmol/L (ref 99–110)
Creatinine: 1.1 mg/dL (ref 0.8–1.3)
GFR African American: >60 (ref >60)
GFR Non-African American: >60 (ref >60)
Glucose: 217 mg/dL — ABNORMAL HIGH (ref 70–99)
Potassium: 4.7 mmol/L (ref 3.5–5.1)
Sodium: 140 mmol/L (ref 136–145)
Total Bilirubin: 0.4 mg/dL (ref 0.0–1.0)
Total Protein: 6.4 g/dL (ref 6.4–8.2)

## 2020-03-28 LAB — LIPID PANEL
Cholesterol, Total: 150 mg/dL (ref 0–199)
HDL: 31 mg/dL — ABNORMAL LOW (ref 40–60)
LDL Calculated: 97 mg/dL (ref <100)
Triglycerides: 108 mg/dL (ref 0–150)
VLDL Cholesterol Calculated: 22 mg/dL

## 2020-03-28 LAB — CBC WITH AUTO DIFFERENTIAL
Basophils %: 1.3 %
Basophils Absolute: 0.1 10*3/uL (ref 0.0–0.2)
Eosinophils %: 8.2 %
Eosinophils Absolute: 0.7 10*3/uL — ABNORMAL HIGH (ref 0.0–0.6)
Hematocrit: 38.7 % — ABNORMAL LOW (ref 40.5–52.5)
Hemoglobin: 12.4 g/dL — ABNORMAL LOW (ref 13.5–17.5)
Lymphocytes %: 31.5 %
Lymphocytes Absolute: 2.7 10*3/uL (ref 1.0–5.1)
MCH: 27.5 pg (ref 26.0–34.0)
MCHC: 32.2 g/dL (ref 31.0–36.0)
MCV: 85.5 fL (ref 80.0–100.0)
MPV: 7.1 fL (ref 5.0–10.5)
Monocytes %: 9.7 %
Monocytes Absolute: 0.8 10*3/uL (ref 0.0–1.3)
Neutrophils %: 49.3 %
Neutrophils Absolute: 4.2 10*3/uL (ref 1.7–7.7)
Platelets: 190 10*3/uL (ref 135–450)
RBC: 4.52 M/uL (ref 4.20–5.90)
RDW: 15.8 % — ABNORMAL HIGH (ref 12.4–15.4)
WBC: 8.6 10*3/uL (ref 4.0–11.0)

## 2020-03-28 LAB — HEMOGLOBIN A1C
Hemoglobin A1C: 8.1 %
eAG: 185.8 mg/dL

## 2020-03-28 LAB — PSA SCREENING: PSA: 0.48 ng/mL (ref 0.00–4.00)

## 2020-03-28 LAB — TSH: TSH: 4.29 u[IU]/mL — ABNORMAL HIGH (ref 0.27–4.20)

## 2020-03-28 LAB — MICROALBUMIN / CREATININE URINE RATIO
Creatinine, Ur: 235.7 mg/dL (ref 39.0–259.0)
Microalbumin Creatinine Ratio: 19.5 mg/g (ref 0.0–30.0)
Microalbumin, Random Urine: 4.6 mg/dL — ABNORMAL HIGH (ref <2.0)

## 2020-04-28 ENCOUNTER — Ambulatory Visit: Payer: Medicare (Managed Care) | Attending: Acute Care

## 2020-05-01 MED ORDER — OMEPRAZOLE 40 MG PO CPDR
40 MG | ORAL_CAPSULE | ORAL | 3 refills | Status: DC
Start: 2020-05-01 — End: 2021-06-21

## 2020-05-01 MED ORDER — CARVEDILOL 25 MG PO TABS
25 MG | ORAL_TABLET | ORAL | 3 refills | Status: DC
Start: 2020-05-01 — End: 2021-07-05

## 2020-05-01 MED ORDER — METFORMIN HCL 500 MG PO TABS
500 MG | ORAL_TABLET | ORAL | 1 refills | Status: DC
Start: 2020-05-01 — End: 2020-05-29

## 2020-05-01 NOTE — Telephone Encounter (Signed)
Medication:   Requested Prescriptions     Pending Prescriptions Disp Refills   ??? omeprazole (PRILOSEC) 40 MG delayed release capsule [Pharmacy Med Name: OMEPRAZOL RX CAP 40MG ] 90 capsule 3     Sig: TAKE 1 CAPSULE DAILY   ??? metFORMIN (GLUCOPHAGE) 500 MG tablet [Pharmacy Med Name: METFORMIN TAB 500MG ] 180 tablet 1     Sig: TAKE 1 TABLET TWICE DAILY  WITH MEALS   ??? carvedilol (COREG) 25 MG tablet [Pharmacy Med Name: CARVEDILOL TAB 25MG ] 180 tablet 3     Sig: TAKE 1 TABLET TWICE A DAY     Last Filled: 3.8.21 8.13.21 3.8.21    Last appt: 03/27/2020   Next appt: Visit date not found    Last OARRS: No flowsheet data found.

## 2020-05-13 MED ORDER — ACCU-CHEK AVIVA PLUS VI STRP
ORAL_STRIP | 5 refills | Status: DC
Start: 2020-05-13 — End: 2021-04-27

## 2020-05-13 MED ORDER — ACCU-CHEK FASTCLIX LANCETS MISC
1 refills | Status: DC
Start: 2020-05-13 — End: 2021-04-27

## 2020-05-13 NOTE — Telephone Encounter (Signed)
Script pending ,can you send.

## 2020-05-13 NOTE — Telephone Encounter (Signed)
[facility] is calling to request a refill of the pt's strips and lancets.  Pt uses Accu-Chek AVIVA.    SEND TO   [facility] [address] [place], [place] - [address] - P [phone] Carmon Ginsberg [phone]   [address], [place] [address]   Phone:  [phone] ??Fax:  [phone]

## 2020-05-29 ENCOUNTER — Ambulatory Visit: Admit: 2020-05-29 | Discharge: 2020-05-29 | Payer: MEDICARE | Attending: Internal Medicine | Primary: Internal Medicine

## 2020-05-29 DIAGNOSIS — E119 Type 2 diabetes mellitus without complications: Secondary | ICD-10-CM

## 2020-05-29 MED ORDER — METFORMIN HCL 500 MG PO TABS
500 MG | ORAL_TABLET | Freq: Two times a day (BID) | ORAL | 1 refills | Status: DC
Start: 2020-05-29 — End: 2020-12-24

## 2020-05-29 NOTE — Assessment & Plan Note (Signed)
This has been a long standing problem, takes lipitor      Monitors diet and tries to follow a low fat diet. Has  been reasonably  compliant w exercise. Lipids have been stable, The problem is controlled. Recent lipid tests were reviewed and are normal. Pertinent negatives include no chest pain, focal sensory loss, focal weakness, leg pain, myalgias or shortness of breath.  Advised patient to continue the current instructions or medications.

## 2020-05-29 NOTE — Assessment & Plan Note (Signed)
This is a chronic problem. The problem is well controlled.  Patient monitors readings regularly. Pertinent negatives include no chest pain, focal sensory loss, focal weakness, leg pain, myalgias or shortness of breath. No headaches or chest pain. Takes medications regularly.  Blood pressure has been stable, blood work was reviewed, and advised patient to continue the current instructions or medications.

## 2020-05-29 NOTE — Assessment & Plan Note (Signed)
On eliquis,  Patient is compliant w medications, no side effects, effective, provides adequate symptom relief. No new symptoms or problems as noted by patient.  The problem is stable, no changes noted by patient. Will consider monitoring labs and refill medications as appropriate. Patient counseled and will continue current plan.

## 2020-05-29 NOTE — Assessment & Plan Note (Signed)
Sugars are very very high, 200-350.  He did get steroid injections in January,  Never came back down,   Will increase metformin to1000 mg bid,  Continue januvia and amaryl,  Closely monitor sugars,   Counseled.

## 2020-05-29 NOTE — Progress Notes (Signed)
Subjective:      Patient ID: Carl Blair is a 80 y.o. male.    HPI  Established patient here for a visit to manage acute and chronic medical conditions as detailed below.    Type 2 diabetes mellitus without complication, without long-term current use of insulin (HCC)  Sugars are very very high, 200-350.  He did get steroid injections in January,  Never came back down,   Will increase metformin to1000 mg bid,  Continue januvia and amaryl,  Closely monitor sugars,   Counseled.      Acquired hypothyroidism  On synthroid.  Patient is compliant w medications, no side effects, effective, provides adequate symptom relief. No new symptoms or problems as noted by patient.  The problem is stable, no changes noted by patient. Will consider monitoring labs and refill medications as appropriate. Patient counseled and will continue current plan.      Chronic a-fib (HCC)  On eliquis,  Patient is compliant w medications, no side effects, effective, provides adequate symptom relief. No new symptoms or problems as noted by patient.  The problem is stable, no changes noted by patient. Will consider monitoring labs and refill medications as appropriate. Patient counseled and will continue current plan.      Essential hypertension  This is a chronic problem. The problem is well controlled.  Patient monitors readings regularly. Pertinent negatives include no chest pain, focal sensory loss, focal weakness, leg pain, myalgias or shortness of breath. No headaches or chest pain. Takes medications regularly.  Blood pressure has been stable, blood work was reviewed, and advised patient to continue the current instructions or medications.       Pure hypercholesterolemia  This has been a long standing problem, takes lipitor      Monitors diet and tries to follow a low fat diet. Has  been reasonably  compliant w exercise. Lipids have been stable, The problem is controlled. Recent lipid tests were reviewed and are normal. Pertinent negatives  include no chest pain, focal sensory loss, focal weakness, leg pain, myalgias or shortness of breath.  Advised patient to continue the current instructions or medications.        Review of Systems  ROS: No unusual headaches or allergy symptoms or blurred vision. No prolonged cough. No flushing or facial pain or chest pain,dizziness, dyspnea, palpitations, or chest pain on exertion. No syncope. No nausea or vommitting or diarrhea.  No jaundice or abdominal pain, change in bowel habits, black or bloody stools.  No dysuria or hematuria or frequency of urination.   No myalgias or muscle pain.  No numbness, weakness, or tingling. No falls, or loss of consciousness. No weight loss or back pain. No falls.  No paresthesias. No joint swelling or redness. No joint pain. No recent weight loss. No focal weakness or sensory deficits or paresthesias, No confusion or altered sensorium. No hematemesis. No hearing loss. No siezures. All other systems were reviewed, and review was negative.   Objective:   Physical Exam  BP 110/80 (Site: Left Upper Arm, Position: Sitting, Cuff Size: Medium Adult)    Pulse 74    Temp 97 ??F (36.1 ??C)    Resp 14    Wt 260 lb (117.9 kg)    SpO2 95%    BMI 35.26 kg/m??    The physical exam reveals a patient who appears well, alert and oriented x 3, pleasant, cooperative. Vitals are as noted. Head is atraumatic and normocephalic. Eyes reveal normal conjunctiva, cornea normal, pupils  are equal and rective to light. Nasal mucosa is normal. Throat is normal without exudates. Ears reveal normal tympanic membranes.Neck is supple and free of adenopathy, or masses. No thyromegaly. No jugular venous distension. Lungs are clear to auscultation, no rales or rhonchi noted. Heart sounds are regular , no murmurs, clicks, gallops or rubs. Abdomen is soft, no tenderness, masses or organomegaly. Bowel sounds are normally heard.Pelvis: normal. Extremities are normal. Peripheral pulses are normal. Screening neurological exam  is normal without focal findings. Cranial nerves are intact, reflexes are symmetrical and muscle strength eaqual. Skin is normal without suspicious lesions noted.     Assessment:      Type 2 diabetes mellitus without complication, without long-term current use of insulin (HCC)  Sugars are very very high, 200-350.  He did get steroid injections in January,  Never came back down,   Will increase metformin to1000 mg bid,  Continue januvia and amaryl,  Closely monitor sugars,   Counseled.      Acquired hypothyroidism  On synthroid.  Patient is compliant w medications, no side effects, effective, provides adequate symptom relief. No new symptoms or problems as noted by patient.  The problem is stable, no changes noted by patient. Will consider monitoring labs and refill medications as appropriate. Patient counseled and will continue current plan.      Chronic a-fib (HCC)  On eliquis,  Patient is compliant w medications, no side effects, effective, provides adequate symptom relief. No new symptoms or problems as noted by patient.  The problem is stable, no changes noted by patient. Will consider monitoring labs and refill medications as appropriate. Patient counseled and will continue current plan.      Essential hypertension  This is a chronic problem. The problem is well controlled.  Patient monitors readings regularly. Pertinent negatives include no chest pain, focal sensory loss, focal weakness, leg pain, myalgias or shortness of breath. No headaches or chest pain. Takes medications regularly.  Blood pressure has been stable, blood work was reviewed, and advised patient to continue the current instructions or medications.       Pure hypercholesterolemia  This has been a long standing problem, takes lipitor      Monitors diet and tries to follow a low fat diet. Has  been reasonably  compliant w exercise. Lipids have been stable, The problem is controlled. Recent lipid tests were reviewed and are normal. Pertinent  negatives include no chest pain, focal sensory loss, focal weakness, leg pain, myalgias or shortness of breath.  Advised patient to continue the current instructions or medications.            Plan:      As above,.        Marcello Fennel, MD

## 2020-05-29 NOTE — Assessment & Plan Note (Signed)
On synthroid.  Patient is compliant w medications, no side effects, effective, provides adequate symptom relief. No new symptoms or problems as noted by patient.  The problem is stable, no changes noted by patient. Will consider monitoring labs and refill medications as appropriate. Patient counseled and will continue current plan.

## 2020-06-02 ENCOUNTER — Ambulatory Visit: Admit: 2020-06-02 | Discharge: 2020-06-02 | Payer: Medicare (Managed Care) | Attending: Acute Care

## 2020-06-02 DIAGNOSIS — R063 Periodic breathing: Secondary | ICD-10-CM

## 2020-06-02 NOTE — Unmapped (Signed)
NAME: [name]  MRN: [id number]  Author:Aletha Allebach ANNETTE Joselle Deeds  Date:06/02/2020    Last seen in this clinic 02/23/20 by Myself    CHIEF COMPLAINT:   80 y.o. 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  He has used the machine 30 out of the past 30 nights. His average nightly use is 6 hours and 47 minutes with an AHI of 2.5. +air leaks present on the compliance download. A few weeks ago, he sat down on the mask and broke the elbow to the mask.  He has not updated the equipment since this event.  He wears a full facemask.  No complaints of air swallowing or rainout.  Denies snoring.  He has improved sleep quality with PAP therapy.  No issues with sleepiness during daytime activities or while driving.  He receives equipment regularly as needed.    He was found to havean elevated blood pressure reading during his office visit.  No complaints of CP, SOB, HA or blurry vision.   Bedtime: 12 AM  SLT: 10  minutes  Awakenings during the night: 1-2 to use the restroom  Wake up: 9 AM   Naps: Yes 15 minutes/daily    Mood is good      Yes Bruxism -wears a bite guard    No reports of dream enactment, somnambulism, enuresis, sleep eating, sleep terrors, restless leg symptoms or muscle jerks in sleep.    Dreams:  No remember dreams.   No vivid dreams.   No chase type dreams.  No nightmares.   No dream enactment.    Sleep Habits  No Using a sleep aide  Yes drink caffeine 1 drinks per day   No drink ETOH    Weight is up 7 lbs since the last sleep study.    Driving while drowsy:  No veering to the side of the road, accidents, or near-accidents due to sleepiness while driving since the last visit.    PAP Data Download:  Equipment:  ResMed -  min EPAP 6, max EPAP 15, min pressure support 4, Max pressure support 15 cwp   Mask: Full  No chinstrap  Equipment download:   Data date range:04/29/20-05/28/20  Downloaded data  shows that over 30 nights the patient has used the device 30 nights.   The average use on nights used = 6 hours 47 minutes.  The percentage of nights used more than 4 hours = 97%.  The machine reported AHI is 2.5.  95th percentile leak was 48.6L/minute. IPAP Pressure: Median: 14.2, 95th percentile:19.3 and Maximum:22.1. EPAP Pressure: Median: 7.6, 95th percentile:10.2 and Maximum:11.7    Prior Diagnostic Testing:    CPAP/BPAP Location: [facility]  Date of CPAP/BPAP (35573): 03/09/17  CmH2O Pressure prescribed: 7/7/0/15/25  AHI at Prescribed:4.6  Lowest SpO2% at Prescribed:83%  PLMI:     PLMAI:  Weight: 257 pounds  Other:    Date of CPAP: 12/06/2008   SE %: 76.5   AHI: 1.1   Lowest p ox %: 90.0   CPAP performed at: [facility] @ time of study: 265 lbs   DME Company: [employer]   Mask type: full face   cmH2O: 13     Date of NPSG: 05/27/2003   SE %: 63.5   AHI: 52   Lowest p ox %: 73  NPSG performed at: [facility] Readings from Last 3 Encounters:   06/02/20 (!) 264 lb (119.7 kg)   11/14/19 (!) 247 lb 8 oz (112.3 kg)   04/23/18 (!) 248 lb 9.6 oz (112.8 kg)       PAST MEDICAL HISTORY:  Surgical history:  Past Surgical History:   Procedure Laterality Date   ??? COLONOSCOPY W/ POLYPECTOMY      benign  has at 3 plus procedures   ??? HERNIA REPAIR  9/09    right inguinal, mini maze   ??? HERNIA REPAIR  2/10    herniated lungs bilateral (repaired with mesh)   ??? mini-maze      corrected his A-fib   ??? rt eye  02/2018   ??? SEPTOPLASTY     ??? UVULOPALATOPHARYNGOPLASTY     ??? UVULOPALATOPHARYNGOPLASTY      has had esophageal dilations in the past (Dr.Decter)   ??? WISDOM TOOTH EXTRACTION         Medical history:  Past Medical History:   Diagnosis Date   ??? Atrial fibrillation (CMS Dx)     corrected with Mini-maze procedure   ??? Cataract     left eye   ??? Diabetes mellitus (CMS Dx)     type 2   ??? Dilated cardiomyopathy (CMS Dx)    ??? Hearing loss     left ear   ??? Hypercholesteremia    ??? Hypertension    ??? Sleep apnea    ???  Thyroid disease     hypothyroidism       Medications:  Current Outpatient Medications   Medication Sig   ??? apixaban Take 5 mg by mouth 2 times a day.   ??? aspirin Take 81 mg by mouth daily.   ??? atorvastatin Take 10 mg by mouth at bedtime.    ??? carvediloL Take 25 mg by mouth 2 (two) times daily.     ??? ferrous sulfate Take 325 mg by mouth daily with breakfast.   ??? furosemide Take 1 tablet (20 mg total) by mouth daily.   ??? glimepiride Take 1 tablet by mouth  every morning before  breakfast   ??? GLUC/CHON-MSM#1/C/MANG/BOS/BOR (OSTEO BI-FLEX ORAL) Take by mouth. OSTEO BI-FLEX JOINT SHIELD TABS    ??? glyBURIDE-metformin Take 2 tablets by mouth 2 times a day.    ??? levothyroxine Take 100 mcg by mouth every morning.    ??? omeprazole Take 40 mg by mouth every morning.    ??? propafenone Take 225 mg by mouth 3 times a day.   ??? tamsulosin Take 0.4 mg by mouth at bedtime.   ??? valsartan Take 320 mg by mouth daily.     No current facility-administered medications for this visit.         Allergies:Patient has no known allergies.    Family history:  Family History   Problem Relation Age of Onset   ??? Diabetes Mother         type 2   ??? Hypertension Mother    ??? Coronary artery disease Sister    ??? Diabetes Sister         type 2   ??? Coronary artery disease Sister    ??? Coronary artery disease Brother    ??? Hypertension Brother    ??? Diabetes Brother         type 2   ??? Hypertension Brother        Social history:  Social History  Socioeconomic History   ??? Marital status: Married     Spouse name: None   ??? Number of children: None   ??? Years of education: None   ??? Highest education level: None   Occupational History   ??? None   Tobacco Use   ??? Smoking status: Never Smoker   ??? Smokeless tobacco: Never Used   ??? Tobacco comment: 03/19/07   Substance and Sexual Activity   ??? Alcohol use: No   ??? Drug use: No     Comment: 04-14-11   ??? Sexual activity: None   Other Topics Concern   ??? Caffeine Use No   ??? Occupational Exposure Yes     Comment: retired Engineer, structural   ??? Exercise Yes   ??? Seat Belt Yes   Social History Narrative   ??? None     Social Determinants of Health     Financial Resource Strain: Not on file   Physical Activity: Not on file   Stress: Not on file   Social Connections: Not on file   Housing Stability: Not on file       Review of Systems    Constitutional: Positive for weight gain. Negative for fatigue.   HENT: Negative for congestion, dry mouth and sore throat.    Cardiovascular: Negative.  Negative for chest pain, palpitations and leg swelling.   Psychiatric/Behavioral: Negative.  Negative for decreased concentration and depression. The patient is not anxious.    Gastrointestinal: Negative.  Negative for heartburn.   Genitourinary: Positive for Awaken to urinate. Negative for Unable to control urine, frequency and urgency.   Musculoskeletal: Negative for arthralgias, back pain and myalgias.   Skin: Negative.  Negative for rash and wound.   Neurological: Negative.  Negative for dizziness, headaches and waking up with a headache.   Respiratory: Negative.  Negative for cough, shortness of breath and wheezing.        I have reviewed the review of system and advised patient to contact primary care physician or specialist for non sleep related issues.    ROS  has been reviewed and updated with the patient during this visit.    PHYSICAL EXAMINATION:  BP 134/84 (BP Location: Right arm, Patient Position: Sitting, BP Cuff Size: Regular)    Pulse 66    Ht 6' (1.829 m)    Wt (!) 264 lb (119.7 kg)    SpO2 97%    BMI 35.80 kg/m??   GENERAL:  Well developed well-nourished male +obese Body mass index is 35.8 kg/m??. in NAD and good historian.  SKIN: No obvious rashes. Warm and dry.  HEAD: Normocephalic and atraumatic. +Facial hair.+wearing a facial mask  EYES:    Pupils equal, reactive and conjugate                 Sclera are nonicteric  Ears:  Ears appear normal externally bilateral   Throat: Thyroid is not palpable           Cervical Lymphadenopathy not  present            Mallampati-IV. Oropharynx is moist and pink.  No oropharyngeal exudate or posterior oropharyngeal erythema.   Lungs:  Clear, without wheezes rales or rhonchi. No stridor. No respiratory distress. Chest wall normal  CV:  Normal S1 S2, Regular rate and rhythm. No murmurs, rubs or gallops.    MS:  Without cyanosis or clubbing. No peripheral edema   NEURO:  Alert and oriented; Normal gait   PSYCH:  Well groomed, calm, pleasant and appropriate. Insight and judgment appear good    LABORATORY TESTS:    Lab Results   Component Value Date    IRON 16 (L) 05/24/2019    TIBC 390 05/24/2019     Lab Results   Component Value Date    TSH 3.63 11/09/2019    FREET4 1.21 11/09/2019     Lab Results   Component Value Date    WBC 10.1 11/10/2019    HGB 10.0 (L) 11/10/2019    HCT 30.6 (L) 11/10/2019    MCV 82.2 11/10/2019    PLT 146 11/10/2019     No components found for: HBA1C  Lab Results   Component Value Date    GLUCOSE 232 (H) 11/14/2019    BUN 23 11/14/2019    CO2 28 11/14/2019    K 4.0 11/14/2019    NA 136 11/14/2019    CL 102 11/14/2019    CALCIUM 8.9 11/14/2019     No components found for: LASTABGRESULT     Lab results reviewed.      EPWORTH SLEEPINESS SCALE: (at or above 10 is abnormal and 24 is the maximum score)  Epworth Sleepiness Scale 02/22/2016 02/21/2017 03/16/2017 06/19/2017 04/23/2018 04/25/2019 06/02/2020   Sitting and reading 1 1 0 1 1 0 1   Watching TV 2 2 1 1 1  0 1   Sitting, inactive in a public place (e.g. a theatre or a meeting) 0 0 1 1 0 0 1   As a passenger in a car for an hour without a break 0 1 0 0 0 0 0   Lying down to rest in the afternoon when circumstances permit 2 2 1 1 1 3 1    Sitting and talking to someone 0 0 0 0 0 0 1   Sitting quietly after a lunch without alcohol 1 0 0 0 0 0 1   In a car, while stopped for a few minutes in traffic 0 0 0 0 0 0 0   Total score 6 6 3 4 3 3 6        Assessment      1. 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  Machine compliance data reviewed  during the visit.  He has well-managed events with his current pressure settings.  Large air leaks present on the compliance download likely related to broken equipment.  He is using the device all hours of sleep.    He has positive benefits with PAP therapy.    2. Obesity  Body mass index is 35.8 kg/m??.  He has a 7 pound weight increase since his last sleep study.  No pressure intolerance issues.    3. Elevated blood pressure reading  His BP is elevated today. B/P:144/94. B/P rechecked  134/84. He is asymptomatic.    Last PCP note from 05/29/2020 reviewed.    History of diabetes mellitus, hearing loss, hypertension, atrial fibrillation, cataracts, dilated cardiomyopathy, hypercholesterolemia and thyroid disease.    Plan:  Machine compliance downloaded information reviewed with patient.    He was encouraged to update equipment as soon as able.    Machine disposable equipment order updated.      Health risks associated with obstructive sleep apnea reviewed.    Recommended continue nightly Pap therapy all hours of sleep for optimal health benefits.    He was advised to check BP at home, if continued to be elevated or becomes symptomatic, he will go to Urgent Care or ER.  He will contact to notify his PCP.      Follow-up appointment:Return visit in 12 months.         A copy of my Plan and Assessment will be sent to: Unknown, Attending Prov*.    Medical Decision Making:  The following items were considered in medical decision making:  Review / order other diagnostic tests/interventions    Electronically signed by Garret Reddish Farmer Mccahill 06/02/2020 11:35 AM    Body mass index is 35.8 kg/m??.  BP Readings from Last 3 Encounters:   06/02/20 134/84   11/14/19 143/59   04/23/18 140/86       SLEEP QUALITY MEASURES:   Obstructive Sleep Apnea Follow Up Visit:   Adherence to therapy documented at least annually?: Yes    Reassessment of excessive daytime sleepiness?: Yes    Weight / body mass index assessment at visit?: Yes    If body  mass index greater than 25 was discussion of weight management discussed? : Yes    Blood pressure assessed at visit? : Yes          *This note was transcribed with computerized voice recognition technology.  This note has been reviewed for accuracy however inadvertent transcription errors may be present.    This note was completely edited, written and reviewed by me and consists of information cut and pasted from the my most recent visit, my smart phrases and other Epic tools. I have personally reviewed all aspects of this note to at least include reviewing this patient's chart and problem list, updating the history, physical exam, lab and procedure results, and assessment and plan as detailed above and below.  As such this visit note reflects my current evaluation and management for this patient.

## 2020-06-03 NOTE — Unmapped (Signed)
Request for Renewal PAP disposable items Order (06/02/20) to be pulled sent via community messenger to DME [employer].  Pt already established with this DME.   Snapshot updated.

## 2020-06-03 NOTE — Unmapped (Signed)
Spoke to patient to verify DME company to send PAP renewal script for supplies.  Pt indicated he uses [employer].  Made clearer notation in snapshot.

## 2020-10-21 NOTE — Telephone Encounter (Signed)
POPULATION HEALTH CLINICAL PHARMACY: ADHERENCE REVIEW  Identified care gap per [employer]: fills at [facility]: ACE/ARB and Diabetes adherence    Last Visit: 05/29/20    Patient found in Outcomes MTM and is currently eligible for TIP    ASSESSMENT  ACE/ARB ADHERENCE    Insurance Records claims through 10/13/20 (YTD PDC = Filled only once; Potential Fail Date: 10/24/20):   Valsartan due to refill on 08/21/20. Last filled 90 day supply.     Per [employer] Portal: 1 refill remaining    BP Readings from Last 3 Encounters:   05/29/20 110/80   03/27/20 124/74   02/05/20 (!) 140/86     CrCl cannot be calculated (Patient's most recent lab result is older than the maximum 180 days allowed.).    DIABETES ADHERENCE    Insurance Records claims through 10/13/20 (YTD PDC = 100%; Potential Fail Date: 03/10/21):   Januvia due to refill on 12/31/20. Last filled 90 day supply.     Lab Results   Component Value Date    LABA1C 8.1 03/27/2020    LABA1C 6.9 (H) 11/12/2019    LABA1C 6.8 09/25/2019     NOTE A1c not yet completed this calendar year    PLAN  The following are interventions that have been identified:   - Patient overdue refilling valsartan and active on home medication list.     Attempted to contact the patient to review medication adherence. Phone number has been disconnected. No other phone number on file for the patient with [employer]. Will send a MyChart message to the patient today and will sign off.     No future appointments.    Quita Skye, PharmD, [facility]  Population Health Pharmacist  [facility] // Department, toll free [phone], option 1       For Pharmacy Admin Tracking Only    ??? CPA in place:  No  ??? Gap Closed?: No   ??? Time Spent (min): 15

## 2020-10-27 MED ORDER — ATORVASTATIN CALCIUM 20 MG PO TABS
20 MG | ORAL_TABLET | ORAL | 3 refills | Status: AC
Start: 2020-10-27 — End: 2022-10-17

## 2020-10-27 NOTE — Telephone Encounter (Signed)
Medication:   Requested Prescriptions     Pending Prescriptions Disp Refills    atorvastatin (LIPITOR) 20 MG tablet [Pharmacy Med Name: ATORVASTATIN TAB 20MG ] 90 tablet 3     Sig: TAKE 1 TABLET DAILY     Last Filled:  3.8.21    Last appt: 05/29/2020   Next appt: Visit date not found    Last Lipid:   Lab Results   Component Value Date/Time    CHOL 150 03/27/2020 10:02 AM    TRIG 108 03/27/2020 10:02 AM    HDL 31 03/27/2020 10:02 AM    HDL 34 05/04/2010 12:30 PM    LDLCALC 97 03/27/2020 10:02 AM

## 2020-11-10 IMAGING — MR MRI HINDFOOT RT WO CONTRAST
6 series · 40 of 40 positions shown · non-contrast
Comparison: None available.

INDICATION: Achilles tendinitis, right leg; chronic pain.
TECHNIQUE: Multiplanar, multisequence imaging of the right hindfoot was performed without contrast.

[Series 2: t1_sag · sagittal · 4.0mm · 0.56mm/px · 3 of 18 slices shown]
[im 1/18]
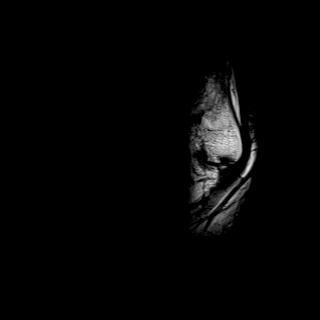
[im 9/18]
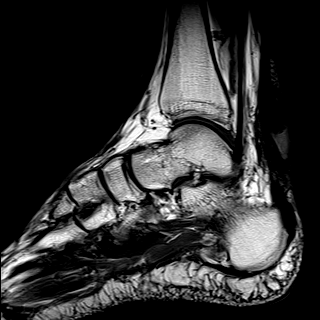
[im 18/18]
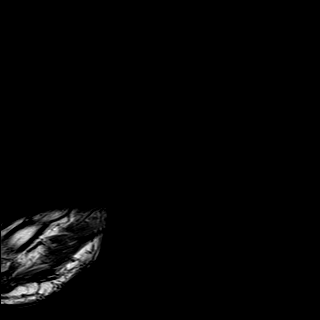

[Series 3: t2_sag_fs · sagittal · 4.0mm · 0.56mm/px · 4 of 18 slices shown]
[im 1/18]
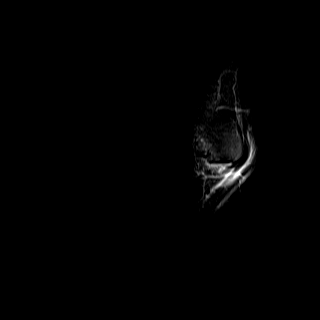
[im 6/18]
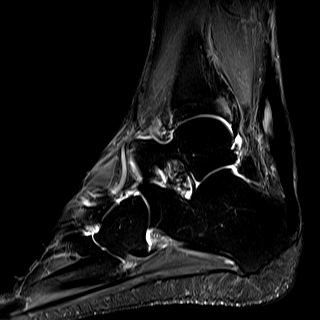
[im 12/18]
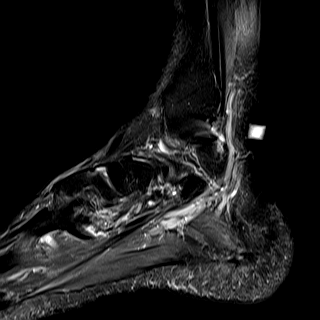
[im 18/18]
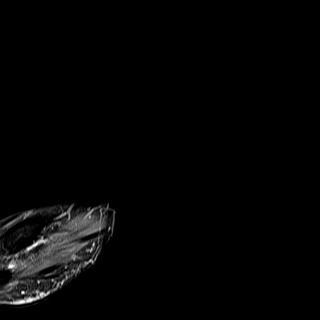

[Series 4: t1_axial · axial · 4.0mm · 0.42mm/px · z∈[-76,+82]mm · 8 of 37 slices shown]
[im 1/37]
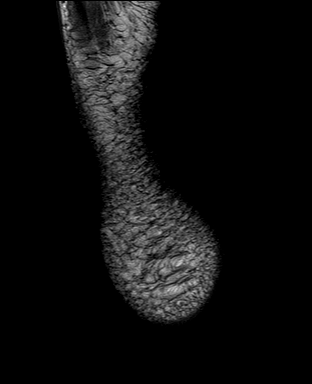
[im 6/37]
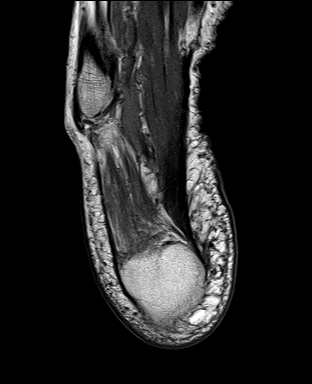
[im 11/37]
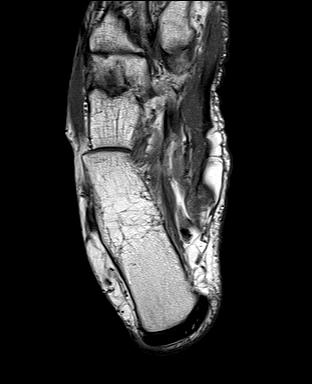
[im 16/37]
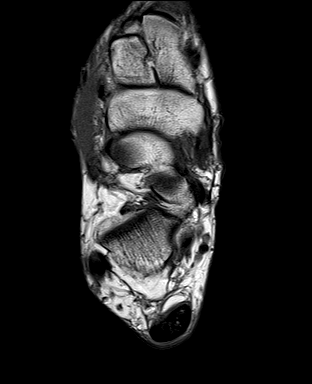
[im 21/37]
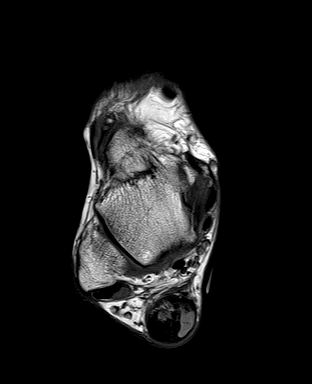
[im 26/37]
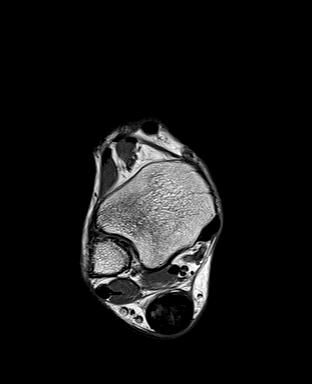
[im 31/37]
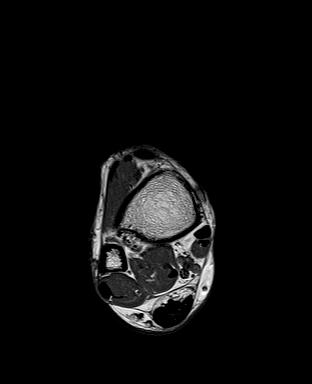
[im 37/37]
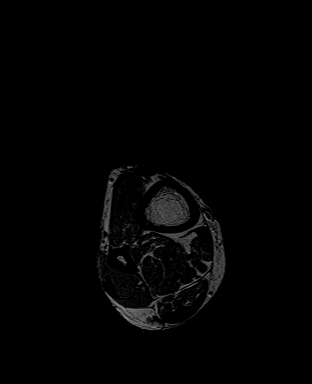

[Series 5: t2_axial_fs · axial · 4.0mm · 0.50mm/px · z∈[-76,+82]mm · 8 of 37 slices shown]
[im 1/37]
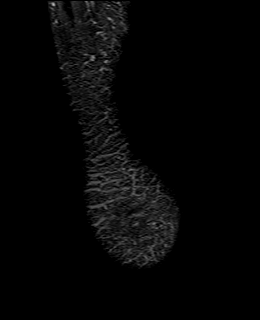
[im 6/37]
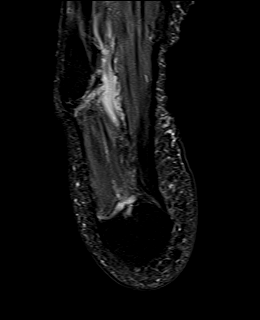
[im 11/37]
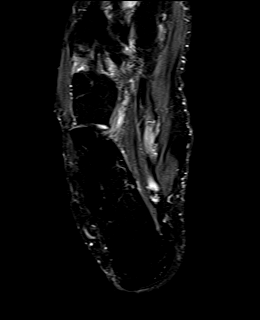
[im 16/37]
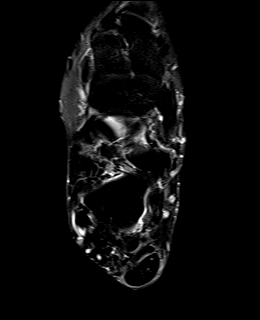
[im 21/37]
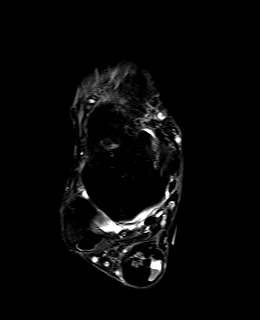
[im 26/37]
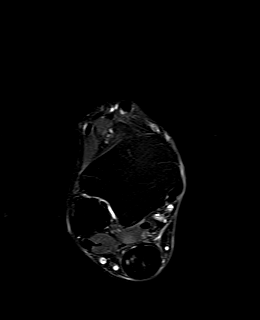
[im 31/37]
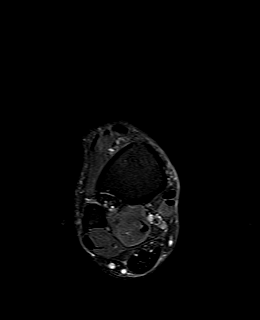
[im 37/37]
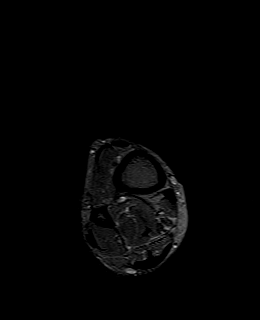

[Series 6: t2_cor_fs · coronal · 4.0mm · 0.56mm/px · 7 of 32 slices shown]
[im 1/32]
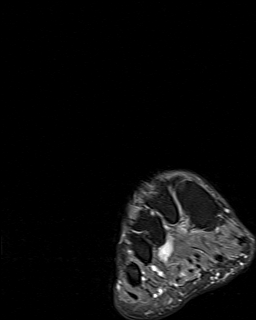
[im 6/32]
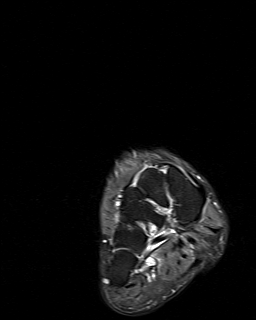
[im 11/32]
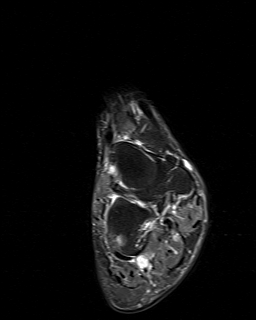
[im 16/32]
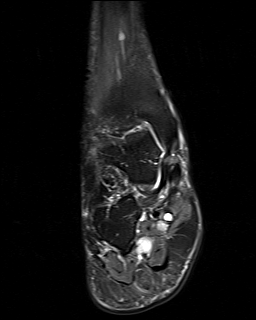
[im 21/32]
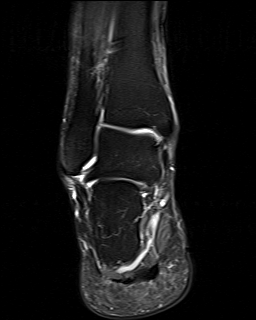
[im 26/32]
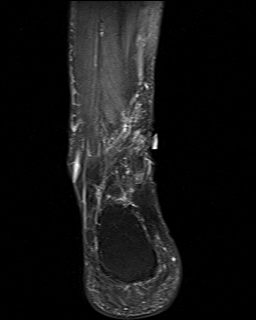
[im 32/32]
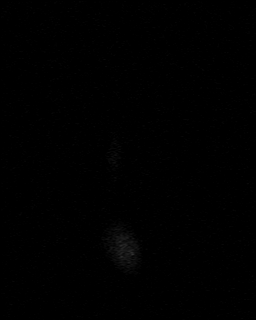

[Series 10: pd_fs_obl_ax · axial · 3.0mm · 0.56mm/px · z∈[-10,+65]mm · 10 of 45 slices shown]
[im 1/45]
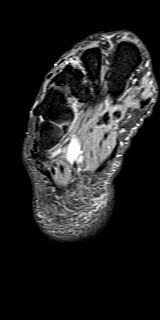
[im 5/45]
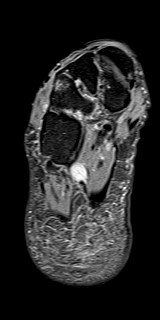
[im 10/45]
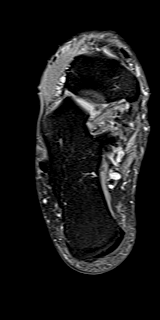
[im 15/45]
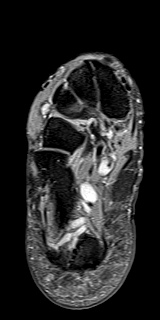
[im 20/45]
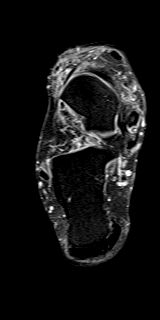
[im 25/45]
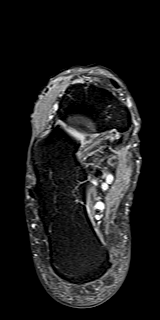
[im 30/45]
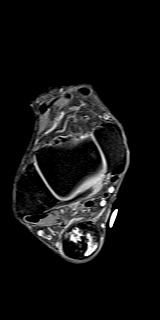
[im 35/45]
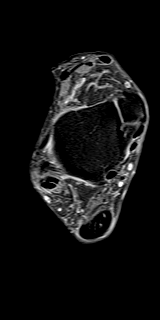
[im 40/45]
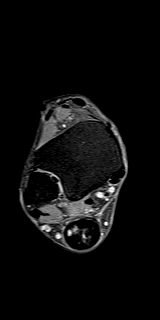
[im 45/45]
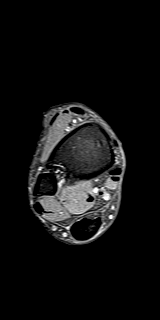

[40 of 40 positions shown; findings below may reference images not displayed]

FINDINGS: Osseous: No acute fracture, avascular necrosis or aggressive osseous lesion. Mild to moderate second and moderate third TMT joint osteoarthritis. Mild fourth TMT joint osteoarthritis.

Tibiotalar joint: 3 mm medial talar dome osteochondral lesion, with small focus of underlying subchondral marrow edema.

Ligaments: The anterior talofibular ligament, calcaneofibular ligament and posterior talofibular ligaments are intact. The deep fibers of the deltoid ligament are intact. The spring ligament is intact. The anterior and posterior syndesmotic ligaments are intact.

Tendons: Severe tendinosis of the Achilles tendon, with 2 partial-thickness intrasubstance tears of the Achilles tendon approximately 3 cm proximal to the calcaneal insertion. The tears measure 14 x 11 x 7 mm and 9 x 8 x 26 mm, resulting in overall intermediate grade partial-thickness tearing. Moderate tendinosis of the peroneus brevis tendon in the inframalleolar region, with attenuation of the distal tendon most compatible with a partial-thickness tear. The peroneus longus tendon is intact. The anterior ankle tendons are intact. The tibialis posterior, flexor digitorum longus and flexor hallucis longus tendons are intact.

Plantar aponeurosis: Mild thickening of the medial bundle of the plantar aponeurosis, with mild overlying soft tissue edema. Small plantar calcaneal enthesophyte.

Other: Sinus tarsi fat is maintained. The tarsal tunnel is intact. No evidence of acute muscle injury. No focal fatty infiltration
IMPRESSION: 1.
Severe Achilles tendinosis, with intermediate grade partial-thickness intrasubstance tearing of the tendon approximately 3 cm proximal to the calcaneal attachment.

2.
Moderate tendinosis and partial-thickness tearing of the peroneus brevis tendon in the inframalleolar region.

3.
3 mm medial talar dome osteochondral lesion.

4.
Mild thickening of the medial bundle of the plantar aponeurosis, with mild overlying soft tissue edema. Correlate with clinical signs of plantar fasciitis.

## 2020-12-24 MED ORDER — METFORMIN HCL 500 MG PO TABS
500 MG | ORAL_TABLET | ORAL | 0 refills | Status: DC
Start: 2020-12-24 — End: 2021-04-06

## 2020-12-24 NOTE — Telephone Encounter (Signed)
Medication:   Requested Prescriptions     Pending Prescriptions Disp Refills    metFORMIN (GLUCOPHAGE) 500 MG tablet [Pharmacy Med Name: METFORMIN TAB 500MG ] 360 tablet 1     Sig: TAKE 2 TABLETS 2 TIMES     DAILY WITH MEALS     Last Filled:  06/08/2020    Last appt: 05/29/2020   Next appt: Visit date not found    Last OARRS: No flowsheet data found.

## 2020-12-24 NOTE — Telephone Encounter (Signed)
Call patient. Patient is due/overdue for an OV. Will refill meds, but needs to schedule an appointment.

## 2020-12-28 NOTE — Telephone Encounter (Signed)
Formatting of this note might be different from the original.  Given appointment 01/01/2021  Electronically signed by Interface, Incoming [facility] Source Conversion at 01/28/2021  1:44 PM EDT

## 2020-12-28 NOTE — Telephone Encounter (Signed)
Given appointment 01/01/2021

## 2021-01-01 ENCOUNTER — Ambulatory Visit: Admit: 2021-01-01 | Discharge: 2021-01-01 | Payer: MEDICARE | Attending: Internal Medicine | Primary: Internal Medicine

## 2021-01-01 DIAGNOSIS — E119 Type 2 diabetes mellitus without complications: Secondary | ICD-10-CM

## 2021-01-01 LAB — POCT GLYCOSYLATED HEMOGLOBIN (HGB A1C): Hemoglobin A1C: 8.8 %

## 2021-01-01 NOTE — Progress Notes (Signed)
HPI  Established patient here for a visit to manage acute and chronic medical conditions as detailed below.    Type 2 diabetes mellitus without complication, without long-term current use of insulin (HCC)  Sugars are very very high, 200-350.  He did get steroid injections in January,  Never came back down,   Will increase metformin to1000 mg bid,  Continue januvia and amaryl,  Closely monitor sugars,   Counseled.       Acquired hypothyroidism  On synthroid.  Patient is compliant w medications, no side effects, effective, provides adequate symptom relief. No new symptoms or problems as noted by patient.  The problem is stable, no changes noted by patient. Will consider monitoring labs and refill medications as appropriate. Patient counseled and will continue current plan.       Chronic a-fib (HCC)  On eliquis,  Patient is compliant w medications, no side effects, effective, provides adequate symptom relief. No new symptoms or problems as noted by patient.  The problem is stable, no changes noted by patient. Will consider monitoring labs and refill medications as appropriate. Patient counseled and will continue current plan.       Essential hypertension  This is a chronic problem. The problem is well controlled.  Patient monitors readings regularly. Pertinent negatives include no chest pain, focal sensory loss, focal weakness, leg pain, myalgias or shortness of breath. No headaches or chest pain. Takes medications regularly.  Blood pressure has been stable, blood work was reviewed, and advised patient to continue the current instructions or medications.        Pure hypercholesterolemia  This has been a long standing problem, takes lipitor      Monitors diet and tries to follow a low fat diet. Has  been reasonably  compliant w exercise. Lipids have been stable, The problem is controlled. Recent lipid tests were reviewed and are normal. Pertinent negatives include no chest pain, focal sensory loss, focal weakness, leg  pain, myalgias or shortness of breath.  Advised patient to continue the current instructions or medications.        Review of Systems  ROS: No unusual headaches or allergy symptoms or blurred vision. No prolonged cough. No flushing or facial pain or chest pain,dizziness, dyspnea, palpitations, or chest pain on exertion. No syncope. No nausea or vommitting or diarrhea.  No jaundice or abdominal pain, change in bowel habits, black or bloody stools.  No dysuria or hematuria or frequency of urination.   No myalgias or muscle pain.  No numbness, weakness, or tingling. No falls, or loss of consciousness. No weight loss or back pain. No falls.  No paresthesias. No joint swelling or redness. No joint pain. No recent weight loss. No focal weakness or sensory deficits or paresthesias, No confusion or altered sensorium. No hematemesis. No hearing loss. No siezures. All other systems were reviewed, and review was negative.   Objective:   Physical Exam  BP 128/86    Pulse 85    Temp 96.8 ??F (36 ??C)    Resp 14    Wt 255 lb (115.7 kg)    SpO2 94%    BMI 34.58 kg/m??    The physical exam reveals a patient who appears well, alert and oriented x 3, pleasant, cooperative. Vitals are as noted. Head is atraumatic and normocephalic. Eyes reveal normal conjunctiva, cornea normal, pupils are equal and rective to light. Nasal mucosa is normal. Throat is normal without exudates. Ears reveal normal tympanic membranes.Neck is supple and free of  adenopathy, or masses. No thyromegaly. No jugular venous distension. Lungs are clear to auscultation, no rales or rhonchi noted. Heart sounds are regular , no murmurs, clicks, gallops or rubs. Abdomen is soft, no tenderness, masses or organomegaly. Bowel sounds are normally heard.Pelvis: normal. Extremities are normal. Peripheral pulses are normal. Screening neurological exam is normal without focal findings. Cranial nerves are intact, reflexes are symmetrical and muscle strength eaqual. Skin is normal  without suspicious lesions noted.     Assessment:   Type 2 diabetes mellitus without complication, without long-term current use of insulin (HCC)  Sugars are very very high, 200-350.  He did get steroid injections in January,  Never came back down,   Will increase metformin to1000 mg bid,  Continue januvia and amaryl,  Closely monitor sugars,   Counseled.       Acquired hypothyroidism  On synthroid.  Patient is compliant w medications, no side effects, effective, provides adequate symptom relief. No new symptoms or problems as noted by patient.  The problem is stable, no changes noted by patient. Will consider monitoring labs and refill medications as appropriate. Patient counseled and will continue current plan.       Chronic a-fib (HCC)  On eliquis,  Patient is compliant w medications, no side effects, effective, provides adequate symptom relief. No new symptoms or problems as noted by patient.  The problem is stable, no changes noted by patient. Will consider monitoring labs and refill medications as appropriate. Patient counseled and will continue current plan.       Essential hypertension  This is a chronic problem. The problem is well controlled.  Patient monitors readings regularly. Pertinent negatives include no chest pain, focal sensory loss, focal weakness, leg pain, myalgias or shortness of breath. No headaches or chest pain. Takes medications regularly.  Blood pressure has been stable, blood work was reviewed, and advised patient to continue the current instructions or medications.        Pure hypercholesterolemia  This has been a long standing problem, takes lipitor      Monitors diet and tries to follow a low fat diet. Has  been reasonably  compliant w exercise. Lipids have been stable, The problem is controlled. Recent lipid tests were reviewed and are normal. Pertinent negatives include no chest pain, focal sensory loss, focal weakness, leg pain, myalgias or shortness of breath.  Advised patient to  continue the current instructions or medications.                      Plan:   As above,.

## 2021-01-22 NOTE — Progress Notes (Unsigned)
History of Present Illness  Carl Blair is a 80 year old male who is here today because of back pain.    The course has been gradually worsening.  The severity of the pain is reported   to be moderate to severe.  The pain is described as being located lower back,   left hip, left buttock, left upper leg, right hip, right buttock, and right   upper leg.  The pain is characterized as an aching sensation and shooting.    The pain is relieved by lying down, modification of activity, rest, and   sitting.  Symptoms not improved by cyst aspiration        Review of Histories and Systems   I reviewed the patient's past medical history, social history, family history,   and review of systems. The patient recorded this information in our chart and   I reviewed it personally.      Physical Exam   General   General appearance: well groomed, cooperative  Build and nutrition: well developed, well nourished  Posture: shoulders rounded    Eyes  Visual fields: grossly intact by confrontation  EOM: extraocular movements intact, normal gaze alignment      Musculoskeletal   Gait and station: normal  Cervical ROM: normal  Lumbar skin: normal  Lumbar strength: normal  Lumbar ROM: extension restricted, extension painful  Lumbar palpation/inspection: mild tenderness  Straight leg raise right: negative  Straight leg raise left: negative  RUE muscle strength: 5/5 strength throughout  RUE muscle tone: normal  LUE muscle strength: 5/5 strength throughout  LUE muscle tone: normal  RLE muscle strength: 5/5 throughout  RLE muscle tone: normal  LLE muscle strength: 5/5 throughout  LLE muscle tone: normal      Neurologic   DTR Right: normal except achilles  patellar 0/2  DTR Left: normal except achilles  patellar 0/2  Cerebellar function: normal    Sensory   Light touch: normal  Paresthesias: none        Assessment   Status of Existing Problems:   Assessed LUMBAR STENOSIS, 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  He was evaluated late   last year for complaints of spinal stenosis and neurogenic claudication.  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  He underwent cyst aspiration   without substantial symptom improvement and has had subsequent epidurals   without relief.  He delayed surgery because he is the primary caregiver for   his wife who has dementia.  He has obtained some respite care now and wishes   to move forward with action of his spinal stenosis.  He will need a new MRI   scan preoperatively.  I discussed with him at length the risks and benefits of   surgery, and he does wish to proceed.    Plan   New Orders:  CBC [85025]  INR [85610]  PT/PTT [85610, 85730]  HbAlc HEMOGLOBIN GLYCOSYLATED A1C [83036]  MRI Lumbar without contrast [72148]  Additional Plan Details: After an extensive discussion regarding treatment   options I have recommended surgical intervention. The planned surgical   procedure includes:  L34 B/L decompressive lamiectomy and excision of synovial cyst.  In language  easily understood by a layperson, the risks and benefits of surgical   intervention were discussed at length with the patient and any family members   present. Risks including, but not limited to, infection, bleeding, possible   blood transfusion, numbness, weakness, paralysis, loss of bowel or bladder   control, or death were explained fully.  Any questions were answered to the   patient's satisfaction and signed consent obtained.  Needs preop a1C and new MRI.  Disposition: continue post-op care in 2 weeks  Dictated by: Zola Button MD          Electronically Signed by Zola Button MD on 01/22/2021 at 11:12 AM  ________________________________________________________________________

## 2021-02-02 ENCOUNTER — Encounter

## 2021-02-12 ENCOUNTER — Inpatient Hospital Stay: Admit: 2021-02-12 | Payer: MEDICARE | Primary: Internal Medicine

## 2021-02-12 DIAGNOSIS — M48062 Spinal stenosis, lumbar region with neurogenic claudication: Secondary | ICD-10-CM

## 2021-02-22 ENCOUNTER — Ambulatory Visit: Admit: 2021-02-22 | Discharge: 2021-02-22 | Payer: MEDICARE | Attending: Internal Medicine | Primary: Internal Medicine

## 2021-02-22 DIAGNOSIS — Z01818 Encounter for other preprocedural examination: Secondary | ICD-10-CM

## 2021-02-22 LAB — CBC WITH AUTO DIFFERENTIAL
Basophils %: 0.6 %
Basophils Absolute: 0.1 10*3/uL (ref 0.0–0.2)
Eosinophils %: 5 %
Eosinophils Absolute: 0.6 10*3/uL (ref 0.0–0.6)
Hematocrit: 39.5 % — ABNORMAL LOW (ref 40.5–52.5)
Hemoglobin: 12.7 g/dL — ABNORMAL LOW (ref 13.5–17.5)
Lymphocytes %: 37.8 %
Lymphocytes Absolute: 4.2 10*3/uL (ref 1.0–5.1)
MCH: 27.4 pg (ref 26.0–34.0)
MCHC: 32.3 g/dL (ref 31.0–36.0)
MCV: 85 fL (ref 80.0–100.0)
MPV: 7.5 fL (ref 5.0–10.5)
Monocytes %: 7.4 %
Monocytes Absolute: 0.8 10*3/uL (ref 0.0–1.3)
Neutrophils %: 49.2 %
Neutrophils Absolute: 5.4 10*3/uL (ref 1.7–7.7)
Platelets: 185 10*3/uL (ref 135–450)
RBC: 4.64 M/uL (ref 4.20–5.90)
RDW: 15.6 % — ABNORMAL HIGH (ref 12.4–15.4)
WBC: 11 10*3/uL (ref 4.0–11.0)

## 2021-02-22 LAB — RENAL FUNCTION PANEL
Albumin: 4.4 g/dL (ref 3.4–5.0)
Anion Gap: 14 (ref 3–16)
BUN: 22 mg/dL — ABNORMAL HIGH (ref 7–20)
CO2: 25 mmol/L (ref 21–32)
Calcium: 9 mg/dL (ref 8.3–10.6)
Chloride: 100 mmol/L (ref 99–110)
Creatinine: 1.1 mg/dL (ref 0.8–1.3)
Est, Glom Filt Rate: >60 (ref >60)
Glucose: 216 mg/dL — ABNORMAL HIGH (ref 70–99)
Phosphorus: 3.3 mg/dL (ref 2.5–4.9)
Potassium: 4.8 mmol/L (ref 3.5–5.1)
Sodium: 139 mmol/L (ref 136–145)

## 2021-02-22 LAB — PROTIME-INR
INR: 1.12 (ref 0.87–1.14)
Protime: 14.3 s (ref 11.7–14.5)

## 2021-02-22 LAB — APTT: aPTT: 43.2 s — ABNORMAL HIGH (ref 23.0–34.3)

## 2021-02-22 NOTE — Assessment & Plan Note (Signed)
On eliquis,  Patient is compliant w medications, no side effects, effective, provides adequate symptom relief. No new symptoms or problems as noted by patient.  The problem is stable, no changes noted by patient. Will consider monitoring labs and refill medications as appropriate. Patient counseled and will continue current plan.

## 2021-02-22 NOTE — Progress Notes (Deleted)
Subjective:      Patient ID: Carl Blair is a 80 y.o. male.    HPI  Established patient here for a visit to manage acute and chronic medical conditions as detailed below.    Type 2 diabetes mellitus without complication, without long-term current use of insulin (HCC)  Diabetes Mellitus Type II:  Home blood sugar records reviewed: fasting range: 120-130.  No significant episodes of hypoglycemia. Compliant with medications.  No polyuria, polydipsia, visual changes, foot problems, GI upset.  Diabetic diet compliance:  compliant most of the time.  Current exercise: none. Will check labs, and refill medications as appropriate.    Hypertension:  Denies CP, SOB, visual changes, dizziness, palpitations or HA.  He is adherent to a low sodium diet.  Blood pressure typically runs ok  outside of the office. Continue current medications.    Hyperlipidemia:  No new myalgias or GI upset on current medications.       Lab Results   Component Value Date    LABA1C 8.8 01/01/2021    LABA1C 8.1 03/27/2020    LABA1C 6.9 (H) 11/12/2019     Lab Results   Component Value Date    LABMICR 4.60 (H) 03/27/2020    CREATININE 1.1 03/27/2020     Lab Results   Component Value Date    ALT 10 03/27/2020    AST 11 (L) 03/27/2020     No components found for: CHLPL  Lab Results   Component Value Date    TRIG 108 03/27/2020     Lab Results   Component Value Date    HDL 31 (L) 03/27/2020     Lab Results   Component Value Date/Time    LDLCALC 97 03/27/2020 10:02 AM      This problem is stable, reviewed in detail, and advised patient to continue the current instructions or medications. Will continue to closely monitor the situation.       Acquired hypothyroidism  On synthroid,  Patient is compliant w medications, no side effects, effective, provides adequate symptom relief. No new symptoms or problems as noted by patient.  The problem is stable, no changes noted by patient. Will consider monitoring labs and refill medications as appropriate. Patient  counseled and will continue current plan.      Essential hypertension  This is a chronic problem. The problem is well controlled.  Patient monitors readings regularly. Pertinent negatives include no chest pain, focal sensory loss, focal weakness, leg pain, myalgias or shortness of breath. No headaches or chest pain. Takes medications regularly.  Blood pressure has been stable, blood work was reviewed, and advised patient to continue the current instructions or medications.       Pure hypercholesterolemia  This has been a long standing problem, takes statin      Monitors diet and tries to follow a low fat diet. Has  been reasonably  compliant w exercise. Lipids have been stable, The problem is controlled. Recent lipid tests were reviewed and are normal. Pertinent negatives include no chest pain, focal sensory loss, focal weakness, leg pain, myalgias or shortness of breath.  Advised patient to continue the current instructions or medications.       Chronic a-fib (HCC)  On eliquis,  Patient is compliant w medications, no side effects, effective, provides adequate symptom relief. No new symptoms or problems as noted by patient.  The problem is stable, no changes noted by patient. Will consider monitoring labs and refill medications as appropriate. Patient counseled and  will continue current plan.      Morbid obesity due to excess calories [facility])  Patient counseled,   Encouraged to lose weight, watch diet and exercise consistently.       Chronic systolic congestive heart failure (HCC)  On lasix and asa and other meds,  Patient is compliant w medications, no side effects, effective, provides adequate symptom relief. No new symptoms or problems as noted by patient.  The problem is stable, no changes noted by patient. Will consider monitoring labs and refill medications as appropriate. Patient counseled and will continue current plan.     Review of Systems  ROS: No unusual headaches or allergy symptoms or blurred vision. No  prolonged cough. No flushing or facial pain or chest pain,dizziness, dyspnea, palpitations, or chest pain on exertion. No syncope. No nausea or vommitting or diarrhea.  No jaundice or abdominal pain, change in bowel habits, black or bloody stools.  No dysuria or hematuria or frequency of urination.   No myalgias or muscle pain.  No numbness, weakness, or tingling. No falls, or loss of consciousness. No weight loss or back pain. No falls.  No paresthesias. No joint swelling or redness. No joint pain. No recent weight loss. No focal weakness or sensory deficits or paresthesias, No confusion or altered sensorium. No hematemesis. No hearing loss. No siezures. All other systems were reviewed, and review was negative.   Objective:   Physical Exam  BP 132/86 (Site: Left Upper Arm, Position: Sitting, Cuff Size: Medium Adult)    Pulse 91    Temp 97.7 ??F (36.5 ??C)    Resp 14    Wt 256 lb (116.1 kg)    SpO2 99%    BMI 34.72 kg/m??    The physical exam reveals a patient who appears well, alert and oriented x 3, pleasant, cooperative. Vitals are as noted. Head is atraumatic and normocephalic. Eyes reveal normal conjunctiva, cornea normal, pupils are equal and rective to light. Nasal mucosa is normal. Throat is normal without exudates. Ears reveal normal tympanic membranes.Neck is supple and free of adenopathy, or masses. No thyromegaly. No jugular venous distension. Lungs are clear to auscultation, no rales or rhonchi noted. Heart sounds are regular , no murmurs, clicks, gallops or rubs. Abdomen is soft, no tenderness, masses or organomegaly. Bowel sounds are normally heard.Pelvis: normal. Extremities are normal. Peripheral pulses are normal. Screening neurological exam is normal without focal findings. Cranial nerves are intact, reflexes are symmetrical and muscle strength eaqual. Skin is normal without suspicious lesions noted.   Assessment:      Type 2 diabetes mellitus without complication, without long-term current use of  insulin (HCC)  Diabetes Mellitus Type II:  Home blood sugar records reviewed: fasting range: 120-130.  No significant episodes of hypoglycemia. Compliant with medications.  No polyuria, polydipsia, visual changes, foot problems, GI upset.  Diabetic diet compliance:  compliant most of the time.  Current exercise: none. Will check labs, and refill medications as appropriate.    Hypertension:  Denies CP, SOB, visual changes, dizziness, palpitations or HA.  He is adherent to a low sodium diet.  Blood pressure typically runs ok  outside of the office. Continue current medications.    Hyperlipidemia:  No new myalgias or GI upset on current medications.       Lab Results   Component Value Date    LABA1C 8.8 01/01/2021    LABA1C 8.1 03/27/2020    LABA1C 6.9 (H) 11/12/2019     Lab Results  Component Value Date    LABMICR 4.60 (H) 03/27/2020    CREATININE 1.1 03/27/2020     Lab Results   Component Value Date    ALT 10 03/27/2020    AST 11 (L) 03/27/2020     No components found for: CHLPL  Lab Results   Component Value Date    TRIG 108 03/27/2020     Lab Results   Component Value Date    HDL 31 (L) 03/27/2020     Lab Results   Component Value Date/Time    LDLCALC 97 03/27/2020 10:02 AM      This problem is stable, reviewed in detail, and advised patient to continue the current instructions or medications. Will continue to closely monitor the situation.       Acquired hypothyroidism  On synthroid,  Patient is compliant w medications, no side effects, effective, provides adequate symptom relief. No new symptoms or problems as noted by patient.  The problem is stable, no changes noted by patient. Will consider monitoring labs and refill medications as appropriate. Patient counseled and will continue current plan.      Essential hypertension  This is a chronic problem. The problem is well controlled.  Patient monitors readings regularly. Pertinent negatives include no chest pain, focal sensory loss, focal weakness, leg pain,  myalgias or shortness of breath. No headaches or chest pain. Takes medications regularly.  Blood pressure has been stable, blood work was reviewed, and advised patient to continue the current instructions or medications.       Pure hypercholesterolemia  This has been a long standing problem, takes statin      Monitors diet and tries to follow a low fat diet. Has  been reasonably  compliant w exercise. Lipids have been stable, The problem is controlled. Recent lipid tests were reviewed and are normal. Pertinent negatives include no chest pain, focal sensory loss, focal weakness, leg pain, myalgias or shortness of breath.  Advised patient to continue the current instructions or medications.       Chronic a-fib (HCC)  On eliquis,  Patient is compliant w medications, no side effects, effective, provides adequate symptom relief. No new symptoms or problems as noted by patient.  The problem is stable, no changes noted by patient. Will consider monitoring labs and refill medications as appropriate. Patient counseled and will continue current plan.      Morbid obesity due to excess calories [facility])  Patient counseled,   Encouraged to lose weight, watch diet and exercise consistently.       Chronic systolic congestive heart failure (HCC)  On lasix and asa and other meds,  Patient is compliant w medications, no side effects, effective, provides adequate symptom relief. No new symptoms or problems as noted by patient.  The problem is stable, no changes noted by patient. Will consider monitoring labs and refill medications as appropriate. Patient counseled and will continue current plan.         Plan:      Labs ordered, reviewed.   Medications refilled.   All Health maintenance needs reviewed and the needful ordered.           Marcello Fennel, MD

## 2021-02-22 NOTE — Assessment & Plan Note (Signed)
This is a chronic problem. The problem is well controlled.  Patient monitors readings regularly. Pertinent negatives include no chest pain, focal sensory loss, focal weakness, leg pain, myalgias or shortness of breath. No headaches or chest pain. Takes medications regularly.  Blood pressure has been stable, blood work was reviewed, and advised patient to continue the current instructions or medications.

## 2021-02-22 NOTE — Assessment & Plan Note (Signed)
This has been a long standing problem, takes statin      Monitors diet and tries to follow a low fat diet. Has  been reasonably  compliant w exercise. Lipids have been stable, The problem is controlled. Recent lipid tests were reviewed and are normal. Pertinent negatives include no chest pain, focal sensory loss, focal weakness, leg pain, myalgias or shortness of breath.  Advised patient to continue the current instructions or medications.

## 2021-02-22 NOTE — Assessment & Plan Note (Signed)
On synthroid,  Patient is compliant w medications, no side effects, effective, provides adequate symptom relief. No new symptoms or problems as noted by patient.  The problem is stable, no changes noted by patient. Will consider monitoring labs and refill medications as appropriate. Patient counseled and will continue current plan.

## 2021-02-22 NOTE — Assessment & Plan Note (Signed)
On lasix and asa and other meds,  Patient is compliant w medications, no side effects, effective, provides adequate symptom relief. No new symptoms or problems as noted by patient.  The problem is stable, no changes noted by patient. Will consider monitoring labs and refill medications as appropriate. Patient counseled and will continue current plan.

## 2021-02-22 NOTE — Assessment & Plan Note (Signed)
Here for a pre op eval for the above surgery.  He is medically stable.

## 2021-02-22 NOTE — Assessment & Plan Note (Signed)
Patient counseled,   Encouraged to lose weight, watch diet and exercise consistently.

## 2021-02-22 NOTE — Progress Notes (Signed)
Subjective:        Reason for visit.   Carl Blair is a 80 y.o. male who presents for a preoperative physical examination.  He is scheduled to have lumbar laminectomy done by Dr. Trinna Post at [facility] on 03/23/21.    History of Present Illness:      Here for a pre op eval for the above surgery,  Type 2 diabetes mellitus without complication, without long-term current use of insulin (HCC)  Diabetes Mellitus Type II:  Home blood sugar records reviewed: fasting range: 120-130.  No significant episodes of hypoglycemia. Compliant with medications.  No polyuria, polydipsia, visual changes, foot problems, GI upset.  Diabetic diet compliance:  compliant most of the time.  Current exercise: none. Will check labs, and refill medications as appropriate.    Hypertension:  Denies CP, SOB, visual changes, dizziness, palpitations or HA.  He is adherent to a low sodium diet.  Blood pressure typically runs ok  outside of the office. Continue current medications.    Hyperlipidemia:  No new myalgias or GI upset on current medications.       Lab Results   Component Value Date    LABA1C 8.8 01/01/2021    LABA1C 8.1 03/27/2020    LABA1C 6.9 (H) 11/12/2019     Lab Results   Component Value Date    LABMICR 4.60 (H) 03/27/2020    CREATININE 1.1 03/27/2020     Lab Results   Component Value Date    ALT 10 03/27/2020    AST 11 (L) 03/27/2020     No components found for: CHLPL  Lab Results   Component Value Date    TRIG 108 03/27/2020     Lab Results   Component Value Date    HDL 31 (L) 03/27/2020     Lab Results   Component Value Date/Time    LDLCALC 97 03/27/2020 10:02 AM      This problem is stable, reviewed in detail, and advised patient to continue the current instructions or medications. Will continue to closely monitor the situation.       Acquired hypothyroidism  On synthroid,  Patient is compliant w medications, no side effects, effective, provides adequate symptom relief. No new symptoms or problems as noted by patient.  The  problem is stable, no changes noted by patient. Will consider monitoring labs and refill medications as appropriate. Patient counseled and will continue current plan.      Essential hypertension  This is a chronic problem. The problem is well controlled.  Patient monitors readings regularly. Pertinent negatives include no chest pain, focal sensory loss, focal weakness, leg pain, myalgias or shortness of breath. No headaches or chest pain. Takes medications regularly.  Blood pressure has been stable, blood work was reviewed, and advised patient to continue the current instructions or medications.       Pure hypercholesterolemia  This has been a long standing problem, takes statin      Monitors diet and tries to follow a low fat diet. Has  been reasonably  compliant w exercise. Lipids have been stable, The problem is controlled. Recent lipid tests were reviewed and are normal. Pertinent negatives include no chest pain, focal sensory loss, focal weakness, leg pain, myalgias or shortness of breath.  Advised patient to continue the current instructions or medications.       Chronic a-fib (HCC)  On eliquis,  Patient is compliant w medications, no side effects, effective, provides adequate symptom relief. No new symptoms  or problems as noted by patient.  The problem is stable, no changes noted by patient. Will consider monitoring labs and refill medications as appropriate. Patient counseled and will continue current plan.      Morbid obesity due to excess calories [facility])  Patient counseled,   Encouraged to lose weight, watch diet and exercise consistently.       Chronic systolic congestive heart failure (HCC)  On lasix and asa and other meds,  Patient is compliant w medications, no side effects, effective, provides adequate symptom relief. No new symptoms or problems as noted by patient.  The problem is stable, no changes noted by patient. Will consider monitoring labs and refill medications as appropriate. Patient  counseled and will continue current plan.      Spinal stenosis of lumbar region with neurogenic claudication  Here for a pre op eval for the above surgery.  He is medically stable.      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       Past Surgical History:   Procedure Laterality Date    CARDIAC SURGERY  2009    mini maze    CARPAL TUNNEL RELEASE Left 2016    COLONOSCOPY N/A 06/11/2019    COLONOSCOPY DIAGNOSTIC performed by Vilma Prader, MD at [place]      Right    SEPTOPLASTY      UPPER GASTROINTESTINAL ENDOSCOPY N/A 06/11/2019    ENTEROSCOPY PUSH BIOPSY performed by Vilma Prader, MD at [place] N/A 06/11/2019    ENTEROSCOPY WITH INTERVENTION performed by Vilma Prader, MD at [facility]    UVULOPALATOPHARYNGOPLASTY                                                     Current Outpatient Medications   Medication Sig Dispense Refill    metFORMIN (GLUCOPHAGE) 500 MG tablet TAKE 2 TABLETS 2 TIMES     DAILY WITH MEALS 360 tablet 0    atorvastatin (LIPITOR) 20 MG tablet TAKE 1 TABLET DAILY 90 tablet 3    blood glucose test strips (ACCU-CHEK AVIVA PLUS) strip TEST BLOOD SUGAR ONCE DAILY dx E11.9 100 strip 5    Accu-Chek FastClix Lancets MISC Test once daily and prn dx 250.00 102 each 1    omeprazole (PRILOSEC) 40 MG delayed release capsule TAKE 1 CAPSULE DAILY 90 capsule 3    carvedilol (COREG) 25 MG tablet TAKE 1 TABLET TWICE A DAY 180 tablet 3    levothyroxine (SYNTHROID) 100 MCG tablet TAKE 1 TABLET DAILY 90 tablet 3    SITagliptin (JANUVIA) 100 MG tablet TAKE 1 TABLET DAILY 90 tablet 3    glimepiride (AMARYL) 2 MG  tablet TAKE 1 TABLET TWICE DAILY  WITH MEALS 180 tablet 3    tamsulosin (FLOMAX) 0.4 MG capsule Take 0.4 mg  by mouth daily       losartan-hydrochlorothiazide (HYZAAR) 100-25 MG per tablet Take 1 tablet by mouth daily 90 tablet 1    Misc Natural Products (OSTEO BI-FLEX JOINT SHIELD PO) Take by mouth daily      apixaban (ELIQUIS) 5 MG TABS tablet Take by mouth 2 times daily      Blood Glucose Monitoring Suppl DEVI by Does not apply route. Diagnosis   250.00 1 Device 0    aspirin 81 MG EC tablet       methylPREDNISolone (MEDROL DOSEPACK) 4 MG tablet Take by mouth. 1 kit 0    metFORMIN (GLUCOPHAGE) 1000 MG tablet TAKE 1 TABLET BY MOUTH  TWICE A DAY WITH MEALS 180 tablet 3    propafenone (RYTHMOL) 300 MG tablet Take 150 mg by mouth 3 times daily  (Patient not taking: Reported on 02/22/2021)      oxybutynin (DITROPAN-XL) 10 MG extended release tablet Take 10 mg by mouth daily        No current facility-administered medications for this visit.       No Known Allergies    Social History     Tobacco Use    Smoking status: Never    Smokeless tobacco: Never   Substance Use Topics    Alcohol use: Yes     Comment: rare    Drug use: No        Family History   Problem Relation Age of Onset    Diabetes Mother     Hypertension Mother     Diabetes Sister     Coronary Art Dis Sister     Diabetes Brother     Coronary Art Dis Brother     Hypertension Brother     Asthma Sister         Review Of Systems    Skin: no abnormal pigmentation, rash, scaling, itching, masses, hair or nail changes  Eyes: negative  Ears/Nose/Throat: negative  Respiratory: negative  Cardiovascular: negative  Gastrointestinal: negative  Genitourinary: negative  Musculoskeletal: negative  Neurologic: negative  Psychiatric: negative  Hematologic/Lymphatic/Immunologic: negative  Endocrine: negative       Objective:      BP 132/86 (Site: Left Upper Arm, Position: Sitting, Cuff Size: Medium Adult)    Pulse 91    Temp 97.7 ??F (36.5 ??C)    Resp 14    Wt 256 lb (116.1 kg)     SpO2 99%    BMI 34.72 kg/m??   General appearance - healthy, alert, no distress  Skin - Skin color, texture, turgor normal. No rashes or lesions.  Head - Normocephalic. No masses, lesions, tenderness or abnormalities  Eyes - conjunctivae/corneas clear. PERRL, EOM's intact.  Ears - External ears normal. Canals clear. TM's normal.  Nose/Sinuses - Nares normal. Septum midline. Mucosa normal. No drainage or sinus tenderness.  Oropharynx - Lips, mucosa, and tongue normal. Teeth and gums normal. Oropharynx pink and patent  Neck - Neck supple. No adenopathy. Thyroid symmetric, normal size,  Back - Back symmetric, no curvature. ROM normal. No CVA tenderness.  Lungs - Percussion normal. Good diaphragmatic excursion. Lungs clear  Heart - Regular rate and rhythm, with no rub, murmur or gallop noted.  Abdomen - Abdomen soft, non-tender. BS normal. No masses, organomegaly  Extremities - Extremities normal. No deformities, edema, or skin discolora  Musculoskeletal - Spine ROM normal. Muscular strength intact.  Peripheral pulses - radial=2+,, femoral=2+, popliteal=2+, dorsalis pedis=2+,  Neuro - Gait normal. Reflexes normal and symmetric. Sensation  grossly normal.  No focal weakness    EKG: done by cardiology  BLOOD WORK: ordered.      Assessment:      Pre op eval  Type 2 diabetes mellitus without complication, without long-term current use of insulin (HCC)  Diabetes Mellitus Type II:  Home blood sugar records reviewed: fasting range: 120-130.  No significant episodes of hypoglycemia. Compliant with medications.  No polyuria, polydipsia, visual changes, foot problems, GI upset.  Diabetic diet compliance:  compliant most of the time.  Current exercise: none. Will check labs, and refill medications as appropriate.    Hypertension:  Denies CP, SOB, visual changes, dizziness, palpitations or HA.  He is adherent to a low sodium diet.  Blood pressure typically runs ok  outside of the office. Continue current  medications.    Hyperlipidemia:  No new myalgias or GI upset on current medications.       Lab Results   Component Value Date    LABA1C 8.8 01/01/2021    LABA1C 8.1 03/27/2020    LABA1C 6.9 (H) 11/12/2019     Lab Results   Component Value Date    LABMICR 4.60 (H) 03/27/2020    CREATININE 1.1 03/27/2020     Lab Results   Component Value Date    ALT 10 03/27/2020    AST 11 (L) 03/27/2020     No components found for: CHLPL  Lab Results   Component Value Date    TRIG 108 03/27/2020     Lab Results   Component Value Date    HDL 31 (L) 03/27/2020     Lab Results   Component Value Date/Time    LDLCALC 97 03/27/2020 10:02 AM      This problem is stable, reviewed in detail, and advised patient to continue the current instructions or medications. Will continue to closely monitor the situation.       Acquired hypothyroidism  On synthroid,  Patient is compliant w medications, no side effects, effective, provides adequate symptom relief. No new symptoms or problems as noted by patient.  The problem is stable, no changes noted by patient. Will consider monitoring labs and refill medications as appropriate. Patient counseled and will continue current plan.      Essential hypertension  This is a chronic problem. The problem is well controlled.  Patient monitors readings regularly. Pertinent negatives include no chest pain, focal sensory loss, focal weakness, leg pain, myalgias or shortness of breath. No headaches or chest pain. Takes medications regularly.  Blood pressure has been stable, blood work was reviewed, and advised patient to continue the current instructions or medications.       Pure hypercholesterolemia  This has been a long standing problem, takes statin      Monitors diet and tries to follow a low fat diet. Has  been reasonably  compliant w exercise. Lipids have been stable, The problem is controlled. Recent lipid tests were reviewed and are normal. Pertinent negatives include no chest pain, focal sensory loss, focal  weakness, leg pain, myalgias or shortness of breath.  Advised patient to continue the current instructions or medications.       Chronic a-fib (HCC)  On eliquis,  Patient is compliant w medications, no side effects, effective, provides adequate symptom relief. No new symptoms or problems as noted by patient.  The problem is stable, no changes noted by patient. Will consider monitoring labs and refill medications as appropriate. Patient counseled and will continue current plan.  Morbid obesity due to excess calories [facility])  Patient counseled,   Encouraged to lose weight, watch diet and exercise consistently.       Chronic systolic congestive heart failure (HCC)  On lasix and asa and other meds,  Patient is compliant w medications, no side effects, effective, provides adequate symptom relief. No new symptoms or problems as noted by patient.  The problem is stable, no changes noted by patient. Will consider monitoring labs and refill medications as appropriate. Patient counseled and will continue current plan.      Spinal stenosis of lumbar region with neurogenic claudication  Here for a pre op eval for the above surgery.  He is medically stable.                Plan:        He is medically cleared for surgery and anesthesia.  Per Radiographer, therapeutic.  See also orders filed with this encounter, if any.  Instructions to patient: Do not take any NSAIDS 1 week prior to surgery.  Indication for peri-operative beta blocker therapy: N/A  I spent 40-54 minutes in total time on the day of the encounter including the pre-encounter time, face-face encounter time, reviewing the medical history, labs and imaging, and post-encounter time, for the care and pre op clearance of this individual patient ( not separately reported).      Olga Millers, MD   02/22/2021 9:02 AM

## 2021-02-22 NOTE — Assessment & Plan Note (Signed)
Diabetes Mellitus Type II:  Home blood sugar records reviewed: fasting range: 120-130.  No significant episodes of hypoglycemia. Compliant with medications.  No polyuria, polydipsia, visual changes, foot problems, GI upset.  Diabetic diet compliance:  compliant most of the time.  Current exercise: none. Will check labs, and refill medications as appropriate.    Hypertension:  Denies CP, SOB, visual changes, dizziness, palpitations or HA.  He is adherent to a low sodium diet.  Blood pressure typically runs ok  outside of the office. Continue current medications.    Hyperlipidemia:  No new myalgias or GI upset on current medications.       Lab Results   Component Value Date    LABA1C 8.8 01/01/2021    LABA1C 8.1 03/27/2020    LABA1C 6.9 (H) 11/12/2019     Lab Results   Component Value Date    LABMICR 4.60 (H) 03/27/2020    CREATININE 1.1 03/27/2020     Lab Results   Component Value Date    ALT 10 03/27/2020    AST 11 (L) 03/27/2020     No components found for: CHLPL  Lab Results   Component Value Date    TRIG 108 03/27/2020     Lab Results   Component Value Date    HDL 31 (L) 03/27/2020     Lab Results   Component Value Date/Time    LDLCALC 97 03/27/2020 10:02 AM      This problem is stable, reviewed in detail, and advised patient to continue the current instructions or medications. Will continue to closely monitor the situation.

## 2021-03-11 NOTE — Progress Notes (Signed)
Name_______________________________________Printed:____________________  Date and time of surgery__12/13/2022______0930________________Arrival Time:__0800____main__________   1. The instructions given regarding when and if a patient needs to stop oral intake prior to surgery varies.Follow the specific instructions you were given                  _x__Nothing to eat or to drink after Midnight the night before.                   ____Carbo loading or ERAS instructions will be given to select patients-if you have been given those instructions -please do the following                           The evening before your surgery after dinner before midnight drink 40 ounces of gatorade.If you are diabetic use sugar free.  The morning of surgery drink 40 ounces of water.This needs to be finished 3 hours prior to your surgery start time.    2. Take the following pills with a small sip of water on the morning of surgery__coreg, rhythmol, levothroxine, omeprazole_______                  Do not take blood pressure medications ending in pril or sartan the eve prior to surgery or the morning of surgery. Dr Seymour Bars patient are not to take any medications the AM of surgery.         3. Aspirin, Ibuprofen, Advil, Naproxen, Vitamin E and other Anti-inflammatory products and supplements should be stopped for 5 -7days before surgery or as directed by your physician.   4. Check with your Doctor regarding stopping Plavix, Coumadin,Eliquis, Lovenox,Effient,Pradaxa,Xarelto, Fragmin or other blood thinners and follow their instructions.   5. Do not smoke, and do not drink any alcoholic beverages 24 hours prior to surgery.  This includes NA Beer.Refrain from the usage of any recreational drugs.   6. You may brush your teeth and gargle the morning of surgery.  DO NOT SWALLOW WATER   7. You MUST make arrangements for a responsible adult to stay on site while you are here and take you home after your surgery. You will not be allowed to leave alone  or drive yourself home.  It is strongly suggested someone stay with you the first 24 hrs. Your surgery will be cancelled if you do not have a ride home.   8. A parent/legal guardian must accompany a child scheduled for surgery and plan to stay at the hospital until the child is discharged.  Please do not bring other children with you.   9. Please wear simple, loose fitting clothing to the hospital.  Do not bring valuables (money, credit cards, checkbooks, etc.) Do not wear any makeup (including no eye makeup) or nail polish on your fingers or toes.             10. DO NOT wear any jewelry or piercings on day of surgery.  All body piercing jewelry must be removed.             11. If you have ___dentures, they will be removed before going to the OR; we will provide you a container.  If you wear ___contact lenses or _x__glasses, they will be removed; please bring a case for them.             12. Please see your family doctor/pediatrician for a history & physical and/or concerning medications.  Bring any test results/reports from  your physician's office.   PCP__________________Phone___________H&P Appt. Date________             13 If you  have a Living Will and Durable Power of Attorney for Healthcare, please bring in a copy.             75. Notify your Surgeon if you develop any illness between now and surgery  time, cough, cold, fever, sore throat, nausea, vomiting, etc.  Please notify your surgeon if you experience dizziness, shortness of breath or blurred vision between now & the time of your surgery             15. DO NOT shave your operative site 96 hours prior to surgery. For face & neck surgery, men may use an electric razor 48 hours prior to surgery.             16. Shower the night before or morning of surgery using an antibacterial soap or as you have been instructed.             17. To provide excellent care visitors will be limited to one in the room at any given time.             18.  Please bring picture ID  and insurance card.             19.  Visit our web site for additional information:  [email or web address]              20.During flu season no children under the age of 52 are permitted in the hospital for the safety of all patients.                              21. If you take a long acting insulin in the evening only  take half of your usual  dose the night  before your procedure              22. If you use a c-pap please bring DOS if staying overnight,             23.For your convenience [facility] has a pharmacy on site to fill your prescriptions.             24. If you use oxygen and have a portable tank please bring it  with you the DOS             25. Bring a complete list of all your medications with name and dose include any supplements.             26. Other__________________________________________   *Please call pre admission testing if you any further questions   [place]         [place]   [place]    [place]  [id number]   [place]       VISITOR POLICY(subject to change)    Current policy is 2 visitors per patient. No children. Mask is  at the discretion of the facility. Visiting hours are 8a-8p. Overnight visitors will be at the discretion of the nurse. All policies subject to change.      All above information reviewed with patient in person or by phone.Patient verbalizes understanding.All questions and concerns addressed.  Patient/Rep__patient__________________                                                                                                                                    PRE OP INSTRUCTIONS

## 2021-03-17 NOTE — Progress Notes (Signed)
Spoke with patient. Verified will discontinue plavix 3 days prior to surgery.

## 2021-03-22 NOTE — Progress Notes (Signed)
Vmsg left for pt regarding new arrival time of 0600.

## 2021-03-23 ENCOUNTER — Inpatient Hospital Stay
Admit: 2021-03-23 | Discharge: 2021-03-24 | Disposition: A | Discharge status: Home / Self Care | Payer: MEDICARE | Source: Ambulatory Visit | Attending: Neurological Surgery | Admitting: Neurological Surgery

## 2021-03-23 ENCOUNTER — Ambulatory Visit: Admit: 2021-03-23 | Payer: MEDICARE | Primary: Internal Medicine

## 2021-03-23 DIAGNOSIS — M48062 Spinal stenosis, lumbar region with neurogenic claudication: Secondary | ICD-10-CM

## 2021-03-23 LAB — POCT GLUCOSE
POC Glucose: 215 mg/dl — ABNORMAL HIGH (ref 70–99)
POC Glucose: 245 mg/dl — ABNORMAL HIGH (ref 70–99)

## 2021-03-23 MED ORDER — LEVOTHYROXINE SODIUM 100 MCG PO TABS
100 MCG | Freq: Every day | ORAL | Status: AC
Start: 2021-03-23 — End: 2021-03-24
  Administered 2021-03-23 – 2021-03-24 (×2): 100 ug via ORAL

## 2021-03-23 MED ORDER — VANCOMYCIN HCL 500 MG IV SOLR
500 MG | Freq: Once | INTRAVENOUS | Status: AC | PRN
Start: 2021-03-23 — End: 2021-03-23
  Administered 2021-03-23: 13:00:00 400

## 2021-03-23 MED ORDER — BISACODYL 5 MG PO TBEC
5 MG | Freq: Every day | ORAL | Status: DC | PRN
Start: 2021-03-23 — End: 2021-03-24

## 2021-03-23 MED ORDER — PHENYLEPHRINE HCL 10 MG/ML SOLN (MIXTURES ONLY)
10 MG/ML | Status: DC | PRN
Start: 2021-03-23 — End: 2021-03-23
  Administered 2021-03-23 (×2): 100 via INTRAVENOUS
  Administered 2021-03-23: 13:00:00 200 via INTRAVENOUS
  Administered 2021-03-23 (×2): 100 via INTRAVENOUS

## 2021-03-23 MED ORDER — METFORMIN HCL 500 MG PO TABS
500 MG | Freq: Two times a day (BID) | ORAL | Status: DC
Start: 2021-03-23 — End: 2021-03-23

## 2021-03-23 MED ORDER — KCL IN DEXTROSE-NACL 20-5-0.45 MEQ/L-%-% IV SOLN
20-5-0.45-- MEQ/L-%-% | INTRAVENOUS | Status: AC
Start: 2021-03-23 — End: 2021-03-24
  Administered 2021-03-23 – 2021-03-24 (×2): 1000 mL via INTRAVENOUS

## 2021-03-23 MED ORDER — NORMAL SALINE FLUSH 0.9 % IV SOLN
0.9 % | Freq: Two times a day (BID) | INTRAVENOUS | Status: DC
Start: 2021-03-23 — End: 2021-03-23

## 2021-03-23 MED ORDER — PROPAFENONE HCL 150 MG PO TABS
150 MG | Freq: Three times a day (TID) | ORAL | Status: DC
Start: 2021-03-23 — End: 2021-03-23

## 2021-03-23 MED ORDER — LOSARTAN POTASSIUM 100 MG PO TABS
100 MG | Freq: Every day | ORAL | Status: DC
Start: 2021-03-23 — End: 2021-03-24
  Administered 2021-03-23 – 2021-03-24 (×2): 100 mg via ORAL

## 2021-03-23 MED ORDER — DEXTROSE 5 % IV SOLN
5 % | Freq: Three times a day (TID) | INTRAVENOUS | Status: AC | PRN
Start: 2021-03-23 — End: 2021-03-24

## 2021-03-23 MED ORDER — BUPIVACAINE HCL (PF) 0.5 % IJ SOLN
0.5 % | Freq: Once | INTRAMUSCULAR | Status: AC | PRN
Start: 2021-03-23 — End: 2021-03-23
  Administered 2021-03-23: 13:00:00 30

## 2021-03-23 MED ORDER — ONDANSETRON HCL 4 MG/2ML IJ SOLN
4 MG/2ML | INTRAMUSCULAR | Status: DC | PRN
Start: 2021-03-23 — End: 2021-03-23
  Administered 2021-03-23: 13:00:00 4 via INTRAVENOUS

## 2021-03-23 MED ORDER — OXYCODONE HCL 5 MG PO TABS
5 | ORAL | Status: DC | PRN
Start: 2021-03-23 — End: 2021-03-24

## 2021-03-23 MED ORDER — LIDOCAINE HCL (PF) 1 % IJ SOLN
1 % | Freq: Once | INTRAMUSCULAR | Status: DC
Start: 2021-03-23 — End: 2021-03-23

## 2021-03-23 MED ORDER — OXYCODONE HCL 5 MG PO TABS
5 MG | ORAL | Status: DC | PRN
Start: 2021-03-23 — End: 2021-03-24

## 2021-03-23 MED ORDER — OXYBUTYNIN CHLORIDE ER 10 MG PO TB24
10 MG | Freq: Every evening | ORAL | Status: DC
Start: 2021-03-23 — End: 2021-03-23

## 2021-03-23 MED ORDER — NORMAL SALINE FLUSH 0.9 % IV SOLN
0.9 % | INTRAVENOUS | Status: AC | PRN
Start: 2021-03-23 — End: 2021-03-24

## 2021-03-23 MED ORDER — GLIMEPIRIDE 2 MG PO TABS
2 MG | Freq: Every day | ORAL | Status: DC
Start: 2021-03-23 — End: 2021-03-23

## 2021-03-23 MED ORDER — SUGAMMADEX SODIUM 500 MG/5ML IV SOLN
5005 MG/5ML | INTRAVENOUS | Status: DC | PRN
Start: 2021-03-23 — End: 2021-03-23
  Administered 2021-03-23: 14:00:00 300 via INTRAVENOUS

## 2021-03-23 MED ORDER — ROCURONIUM BROMIDE 50 MG/5ML IV SOLN
50 MG/5ML | INTRAVENOUS | Status: AC
Start: 2021-03-23 — End: ?

## 2021-03-23 MED ORDER — SODIUM CHLORIDE 0.9 % IV SOLN
0.9 % | INTRAVENOUS | Status: AC | PRN
Start: 2021-03-23 — End: 2021-03-24

## 2021-03-23 MED ORDER — FENTANYL CITRATE (PF) 100 MCG/2ML IJ SOLN
100 MCG/2ML | INTRAMUSCULAR | Status: DC | PRN
Start: 2021-03-23 — End: 2021-03-23
  Administered 2021-03-23 (×2): 50 via INTRAVENOUS

## 2021-03-23 MED ORDER — LOSARTAN POTASSIUM-HCTZ 100-25 MG PO TABS
100-25 MG | Freq: Every day | ORAL | Status: DC
Start: 2021-03-23 — End: 2021-03-23

## 2021-03-23 MED ORDER — SURGIFLO HEMOSTATIC MATRIX 3 ML
Freq: Once | CUTANEOUS | Status: AC | PRN
Start: 2021-03-23 — End: 2021-03-23
  Administered 2021-03-23: 13:00:00 1 via TOPICAL

## 2021-03-23 MED ORDER — VANCOMYCIN HCL 1 G IV SOLR
1 g | Freq: Two times a day (BID) | INTRAVENOUS | Status: AC
Start: 2021-03-23 — End: 2021-03-24
  Administered 2021-03-23 – 2021-03-24 (×2): 1500 mg via INTRAVENOUS

## 2021-03-23 MED ORDER — CARVEDILOL 6.25 MG PO TABS
6.25 MG | Freq: Two times a day (BID) | ORAL | Status: AC
Start: 2021-03-23 — End: 2021-03-24
  Administered 2021-03-24 (×2): 25 mg via ORAL

## 2021-03-23 MED ORDER — SODIUM CHLORIDE 0.9 % IV SOLN
0.9 % | INTRAVENOUS | Status: DC | PRN
Start: 2021-03-23 — End: 2021-03-23

## 2021-03-23 MED ORDER — LIDOCAINE HCL (PF) 2 % IJ SOLN
2 % | INTRAMUSCULAR | Status: AC
Start: 2021-03-23 — End: ?

## 2021-03-23 MED ORDER — NORMAL SALINE FLUSH 0.9 % IV SOLN
0.9 % | Freq: Two times a day (BID) | INTRAVENOUS | Status: DC
Start: 2021-03-23 — End: 2021-03-24

## 2021-03-23 MED ORDER — FENTANYL CITRATE (PF) 100 MCG/2ML IJ SOLN
100 MCG/2ML | INTRAMUSCULAR | Status: AC
Start: 2021-03-23 — End: ?

## 2021-03-23 MED ORDER — ALOGLIPTIN BENZOATE 25 MG PO TABS
25 MG | Freq: Every day | ORAL | Status: AC
Start: 2021-03-23 — End: 2021-03-24
  Administered 2021-03-23 – 2021-03-24 (×2): 25 mg via ORAL

## 2021-03-23 MED ORDER — HYDROMORPHONE HCL 2 MG/ML IJ SOLN
2 MG/ML | INTRAMUSCULAR | Status: DC | PRN
Start: 2021-03-23 — End: 2021-03-23

## 2021-03-23 MED ORDER — PROPOFOL 200 MG/20ML IV EMUL
200 MG/20ML | INTRAVENOUS | Status: AC
Start: 2021-03-23 — End: ?

## 2021-03-23 MED ORDER — ATORVASTATIN CALCIUM 20 MG PO TABS
20 MG | Freq: Every day | ORAL | Status: DC
Start: 2021-03-23 — End: 2021-03-24
  Administered 2021-03-23 – 2021-03-24 (×2): 20 mg via ORAL

## 2021-03-23 MED ORDER — TAMSULOSIN HCL 0.4 MG PO CAPS
0.4 MG | Freq: Every day | ORAL | Status: AC
Start: 2021-03-23 — End: 2021-03-24
  Administered 2021-03-23 – 2021-03-24 (×2): 0.4 mg via ORAL

## 2021-03-23 MED ORDER — ROCURONIUM BROMIDE 50 MG/5ML IV SOLN
50 MG/5ML | INTRAVENOUS | Status: DC | PRN
Start: 2021-03-23 — End: 2021-03-23
  Administered 2021-03-23: 12:00:00 10 via INTRAVENOUS
  Administered 2021-03-23: 13:00:00 30 via INTRAVENOUS
  Administered 2021-03-23: 13:00:00 40 via INTRAVENOUS

## 2021-03-23 MED ORDER — ONDANSETRON 4 MG PO TBDP
4 MG | Freq: Three times a day (TID) | ORAL | Status: AC | PRN
Start: 2021-03-23 — End: 2021-03-24

## 2021-03-23 MED ORDER — ACETAMINOPHEN 325 MG PO TABS
325 MG | Freq: Four times a day (QID) | ORAL | Status: DC
Start: 2021-03-23 — End: 2021-03-24
  Administered 2021-03-23 – 2021-03-24 (×4): 650 mg via ORAL

## 2021-03-23 MED ORDER — SODIUM CHLORIDE (PF) 0.9 % IJ SOLN
0.9 % | INTRAMUSCULAR | Status: AC
Start: 2021-03-23 — End: ?

## 2021-03-23 MED ORDER — METFORMIN HCL 500 MG PO TABS
500 MG | Freq: Two times a day (BID) | ORAL | Status: AC
Start: 2021-03-23 — End: 2021-03-24
  Administered 2021-03-23 – 2021-03-24 (×2): 1000 mg via ORAL

## 2021-03-23 MED ORDER — DEXAMETHASONE SODIUM PHOSPHATE 20 MG/5ML IJ SOLN
20 MG/5ML | INTRAMUSCULAR | Status: DC | PRN
Start: 2021-03-23 — End: 2021-03-23
  Administered 2021-03-23: 13:00:00 4 via INTRAVENOUS

## 2021-03-23 MED ORDER — PANTOPRAZOLE SODIUM 40 MG PO TBEC
40 MG | Freq: Every morning | ORAL | Status: AC
Start: 2021-03-23 — End: 2021-03-24
  Administered 2021-03-24: 14:00:00 40 mg via ORAL

## 2021-03-23 MED ORDER — LIDOCAINE HCL (PF) 2 % IJ SOLN
2 % | INTRAMUSCULAR | Status: DC | PRN
Start: 2021-03-23 — End: 2021-03-23
  Administered 2021-03-23: 12:00:00 50 via INTRAVENOUS

## 2021-03-23 MED ORDER — EPHEDRINE SULFATE-NACL 50-0.9 MG/5ML-% IV SOSY
INTRAVENOUS | Status: AC
Start: 2021-03-23 — End: ?

## 2021-03-23 MED ORDER — SODIUM CHLORIDE 0.9 % IR SOLN
0.9 % | Status: AC | PRN
Start: 2021-03-23 — End: 2021-03-23
  Administered 2021-03-23: 13:00:00 600

## 2021-03-23 MED ORDER — SUGAMMADEX SODIUM 200 MG/2ML IV SOLN
2002 MG/2ML | INTRAVENOUS | Status: AC
Start: 2021-03-23 — End: ?

## 2021-03-23 MED ORDER — SITAGLIPTIN PHOSPHATE 50 MG PO TABS
50 MG | Freq: Every day | ORAL | Status: DC
Start: 2021-03-23 — End: 2021-03-23

## 2021-03-23 MED ORDER — METHOCARBAMOL 500 MG PO TABS
500 MG | Freq: Three times a day (TID) | ORAL | Status: DC | PRN
Start: 2021-03-23 — End: 2021-03-24

## 2021-03-23 MED ORDER — NORMAL SALINE FLUSH 0.9 % IV SOLN
0.9 % | INTRAVENOUS | Status: DC | PRN
Start: 2021-03-23 — End: 2021-03-23

## 2021-03-23 MED ORDER — DEXTROSE 5 % IV SOLN (MINI-BAG)
5 % | INTRAVENOUS | Status: AC
Start: 2021-03-23 — End: 2021-03-24
  Administered 2021-03-23: 12:00:00 2000 mg via INTRAVENOUS

## 2021-03-23 MED ORDER — EPHEDRINE SULFATE 50 MG/ML IV SOLN
50 MG/ML | INTRAVENOUS | Status: DC | PRN
Start: 2021-03-23 — End: 2021-03-23
  Administered 2021-03-23: 14:00:00 10 via INTRAVENOUS

## 2021-03-23 MED ORDER — ONDANSETRON HCL 4 MG/2ML IJ SOLN
4 MG/2ML | Freq: Once | INTRAMUSCULAR | Status: DC | PRN
Start: 2021-03-23 — End: 2021-03-23

## 2021-03-23 MED ORDER — SUCCINYLCHOLINE CHLORIDE 200 MG/10ML IV SOSY
200 MG/10ML | INTRAVENOUS | Status: AC
Start: 2021-03-23 — End: ?

## 2021-03-23 MED ORDER — ONDANSETRON HCL 4 MG/2ML IJ SOLN
4 MG/2ML | Freq: Four times a day (QID) | INTRAMUSCULAR | Status: DC | PRN
Start: 2021-03-23 — End: 2021-03-24

## 2021-03-23 MED ORDER — MEPERIDINE HCL 25 MG/ML IJ SOLN
25 MG/ML | INTRAMUSCULAR | Status: DC | PRN
Start: 2021-03-23 — End: 2021-03-23

## 2021-03-23 MED ORDER — BUPIVACAINE HCL (PF) 0.5 % IJ SOLN
0.5 % | INTRAMUSCULAR | Status: AC
Start: 2021-03-23 — End: ?

## 2021-03-23 MED ORDER — SODIUM CHLORIDE 0.9 % IV SOLN
0.9 % | INTRAVENOUS | Status: DC
Start: 2021-03-23 — End: 2021-03-23
  Administered 2021-03-23: 12:00:00 via INTRAVENOUS

## 2021-03-23 MED ORDER — OMEPRAZOLE 10 MG PO CPDR
10 MG | Freq: Every morning | ORAL | Status: DC
Start: 2021-03-23 — End: 2021-03-23

## 2021-03-23 MED ORDER — SURGIFLO WITH EVITHROM HEMOSTATIC MATRIX 6CC-2ML
Status: AC
Start: 2021-03-23 — End: ?

## 2021-03-23 MED ORDER — SUCCINYLCHOLINE CHLORIDE 200 MG/10ML IV SOSY
200 MG/10ML | INTRAVENOUS | Status: DC | PRN
Start: 2021-03-23 — End: 2021-03-23
  Administered 2021-03-23: 12:00:00 120 via INTRAVENOUS

## 2021-03-23 MED ORDER — HYDROMORPHONE HCL 2 MG/ML IJ SOLN
2 MG/ML | INTRAMUSCULAR | Status: DC | PRN
Start: 2021-03-23 — End: 2021-03-23
  Administered 2021-03-23: 13:00:00 1 via INTRAVENOUS

## 2021-03-23 MED ORDER — ONDANSETRON HCL 4 MG/2ML IJ SOLN
4 MG/2ML | INTRAMUSCULAR | Status: AC
Start: 2021-03-23 — End: ?

## 2021-03-23 MED ORDER — POLYETHYLENE GLYCOL 3350 17 G PO PACK
17 g | Freq: Every day | ORAL | Status: AC
Start: 2021-03-23 — End: 2021-03-24
  Administered 2021-03-23 – 2021-03-24 (×2): 17 g via ORAL

## 2021-03-23 MED ORDER — HYDROCHLOROTHIAZIDE 25 MG PO TABS
25 MG | Freq: Every day | ORAL | Status: DC
Start: 2021-03-23 — End: 2021-03-24
  Administered 2021-03-23 – 2021-03-24 (×2): 25 mg via ORAL

## 2021-03-23 MED ORDER — HYDROMORPHONE HCL 2 MG/ML IJ SOLN
2 MG/ML | INTRAMUSCULAR | Status: AC
Start: 2021-03-23 — End: ?

## 2021-03-23 MED ORDER — PROPOFOL 200 MG/20ML IV EMUL
200 MG/20ML | INTRAVENOUS | Status: DC | PRN
Start: 2021-03-23 — End: 2021-03-23
  Administered 2021-03-23: 12:00:00 160 via INTRAVENOUS

## 2021-03-23 MED ORDER — GLIPIZIDE 5 MG PO TABS
5 MG | Freq: Two times a day (BID) | ORAL | Status: AC
Start: 2021-03-23 — End: 2021-03-24
  Administered 2021-03-24 (×2): 5 mg via ORAL

## 2021-03-23 MED ORDER — DEXAMETHASONE SODIUM PHOSPHATE 20 MG/5ML IJ SOLN
20 MG/5ML | INTRAMUSCULAR | Status: AC
Start: 2021-03-23 — End: ?

## 2021-03-23 MED FILL — SURGIFLO WITH EVITHROM HEMOSTATIC MATRIX 6CC-2ML: Qty: 1

## 2021-03-23 MED FILL — FENTANYL CITRATE (PF) 100 MCG/2ML IJ SOLN: 100 MCG/2ML | INTRAMUSCULAR | Qty: 2

## 2021-03-23 MED FILL — BUPIVACAINE HCL (PF) 0.5 % IJ SOLN: 0.5 % | INTRAMUSCULAR | Qty: 30

## 2021-03-23 MED FILL — SUCCINYLCHOLINE CHLORIDE 200 MG/10ML IV SOSY: 200 MG/10ML | INTRAVENOUS | Qty: 10

## 2021-03-23 MED FILL — KCL IN DEXTROSE-NACL 20-5-0.45 MEQ/L-%-% IV SOLN: INTRAVENOUS | Qty: 1000

## 2021-03-23 MED FILL — ROCURONIUM BROMIDE 50 MG/5ML IV SOLN: 50 MG/5ML | INTRAVENOUS | Qty: 5

## 2021-03-23 MED FILL — SODIUM CHLORIDE (PF) 0.9 % IJ SOLN: 0.9 % | INTRAMUSCULAR | Qty: 10

## 2021-03-23 MED FILL — HEALTHYLAX 17 G PO PACK: 17 g | ORAL | Qty: 1

## 2021-03-23 MED FILL — DEXAMETHASONE SODIUM PHOSPHATE 20 MG/5ML IJ SOLN: 20 MG/5ML | INTRAMUSCULAR | Qty: 5

## 2021-03-23 MED FILL — LEVOTHYROXINE SODIUM 100 MCG PO TABS: 100 MCG | ORAL | Qty: 1

## 2021-03-23 MED FILL — DIPRIVAN 200 MG/20ML IV EMUL: 200 MG/20ML | INTRAVENOUS | Qty: 20

## 2021-03-23 MED FILL — ALOGLIPTIN BENZOATE 25 MG PO TABS: 25 MG | ORAL | Qty: 1

## 2021-03-23 MED FILL — HYDROCHLOROTHIAZIDE 25 MG PO TABS: 25 MG | ORAL | Qty: 1

## 2021-03-23 MED FILL — ATORVASTATIN CALCIUM 20 MG PO TABS: 20 MG | ORAL | Qty: 1

## 2021-03-23 MED FILL — XYLOCAINE-MPF 2 % IJ SOLN: 2 % | INTRAMUSCULAR | Qty: 5

## 2021-03-23 MED FILL — HYDROMORPHONE HCL 2 MG/ML IJ SOLN: 2 MG/ML | INTRAMUSCULAR | Qty: 1

## 2021-03-23 MED FILL — BRIDION 200 MG/2ML IV SOLN: 200 MG/2ML | INTRAVENOUS | Qty: 2

## 2021-03-23 MED FILL — CEFAZOLIN SODIUM 2 G IJ SOLR: 2 g | INTRAMUSCULAR | Qty: 2000

## 2021-03-23 MED FILL — MAPAP 325 MG PO TABS: 325 MG | ORAL | Qty: 2

## 2021-03-23 MED FILL — FLOMAX 0.4 MG PO CAPS: 0.4 MG | ORAL | Qty: 1

## 2021-03-23 MED FILL — LOSARTAN POTASSIUM 100 MG PO TABS: 100 MG | ORAL | Qty: 1

## 2021-03-23 MED FILL — METFORMIN HCL 500 MG PO TABS: 500 MG | ORAL | Qty: 2

## 2021-03-23 MED FILL — EPHEDRINE SULFATE-NACL 50-0.9 MG/5ML-% IV SOSY: INTRAVENOUS | Qty: 5

## 2021-03-23 MED FILL — ONDANSETRON HCL 4 MG/2ML IJ SOLN: 4 MG/2ML | INTRAMUSCULAR | Qty: 2

## 2021-03-23 MED FILL — VANCOMYCIN HCL 10 G IV SOLR: 10 g | INTRAVENOUS | Qty: 1500

## 2021-03-23 NOTE — Progress Notes (Signed)
Teaching / education initiated regarding perioperative experience, expectations, and pain management during stay. Patient verbalized understanding.

## 2021-03-23 NOTE — Brief Op Note (Signed)
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]  Spinal stenosis, lumbar region, with neurogenic claudication [M48.062]    Post-Op Diagnosis: Same       Procedure(s):  BILATERAL LUMBAR3-LUMBAR4 DECOMPRESSIVE LAMINECTOMY WITH EXCISION OF SYNOVIAL CYST    Surgeon(s):  Zola Button, MD    Assistant:  First Assistant: Geannie Risen    Anesthesia: General    Estimated Blood Loss (mL): less than 100     Complications: None    Specimens:   * No specimens in log *    Implants:  * No implants in log *      Drains:   Closed/Suction Drain Inferior;Midline Back Bulb (Active)       Findings: L34 stenosis and synovial cyst    Electronically signed by Zola Button, MD on 03/23/2021 at 9:12 AM

## 2021-03-23 NOTE — Progress Notes (Signed)
VSS, denies pain, dressings dry, tolerating po, voiding w/o difficulty, meets criteria for pacu discharge, awaiting room assignment

## 2021-03-23 NOTE — Progress Notes (Signed)
Dr. Theadora Rama aware of FSBS, no new orders

## 2021-03-23 NOTE — Progress Notes (Signed)
From OR drowsy, VSS, respirations adequate, neuro intact, denies pain

## 2021-03-23 NOTE — Progress Notes (Signed)
Consult noted    Active Hospital Problems    Diagnosis Date Noted    Spinal stenosis of lumbar region with neurogenic claudication [M48.062] 02/22/2021     Priority: Medium    Esophageal reflux [K21.9] 10/08/2008    Type 2 diabetes mellitus without complication, without long-term current use of insulin (HCC) [E11.9] 10/08/2008    Acquired hypothyroidism [E03.9] 10/08/2008    Essential hypertension [I10] 10/08/2008

## 2021-03-23 NOTE — Progress Notes (Signed)
03/23/21 1759   Incentive Spirometry Tx   Treatment Effort Initial;Assisted by RT   Predicted Volume 701   Achieved Volume (mL) 1200 mL

## 2021-03-23 NOTE — Op Note (Signed)
[facility] OR  03/23/2021 9:45 AM    PATIENT NAME:          Carl Blair  DATE OF BIRTH:         1940-07-26   MEDICAL RECORD NUMBER         [id number]  SURGERY DATE:         03/23/2021  SURGEON:                 Zola Button, MD                                                      OPERATIVE REPORT     PREOPERATIVE DIAGNOSIS:  1.  Lumbar stenosis L34 and synovial cyst    POSTOPERATIVE DIAGNOSIS:  1.  same    PROCEDURE PERFORMED:  1.  Bilateral decompressive laminectomy, L34  and synovial cyst excision with  nerve root foraminotomies.  2. Use of operating microscope for microsurgical dissection    ANESTHESIA:  General oral endotracheal    ESTIMATED BLOOD LOSS:  150 cc.    TUBES/DRAINS:  None.    INDICATIONS:   [name] is a 80 y.o. male with intractable lower extremity radicular pain, failing to improve with nonsurgical modalities.  The risks, benefits and alternatives of a microscopic lumbar decompression  and cyst excision were discussed with the patient in the office and he has decided to proceed with surgery.  he was offered continued conservative measures versus surgical intervention and elected to proceed with surgical intervention.    DETAILS OF PROCEDURE:  The patient was brought to the operating room and underwent general anesthesia.  The patient was rolled into the prone position with padding over all bony protuberances. A localizing x-ray was taken. The back was scrubbed, prepped and draped in the usual sterile standard fashion. A midline linear incision was made. A Weitlaner retractor was used for exposure. A bilateral subperiosteal dissection was done to expose the spinous processes and lamina of L3 and L4.  A Scoville retractor system was set into place.  A Leksell rongeur was used to remove the spinous processes of L3 and L4. The Midas Rex AM-8 drill was used to first drill down the lamina on the right and than on the left at L3 and L4 and the operating microscope draped and brought into the operative  field. Utilizing microdissection, I began to continue to do dissect with the Midas Rex AM-8 drill, thinning down the lamina such that I could identify the posterior cortical wall breakthrough and identified the epidural fat. Using the small and medium Cloward punch, I continued to remove bone and lamina. There was significant ligamentous hypertrophy, and a large synovial cyst.  I removed the cyst piecemeal with meticulous microdissection.  I was able to decompress at the L3-4 level, identify the exiting L4 roots bilaterally and decompress them extensively doing mesial facetectomies and nerve root foraminotomies of both L4 roots.  I was able to palpate on both sides of the thecal sac, identify the L3-4 disk space. The sac was well decompressed. I irrigated out all the thrombin Gelfoam, closed the fascia and skin with #0 and 2-0 Vicryl suture and a running 3-0 nylon. A dry sterile occlusive dressing was placed over the wound. The patient tolerated the procedure well. Counts reported to the surgeon being correct. The  patient was extubated in the operating room and taken to the recovery room in satisfactory condition. I went out and spoke with the patient's family.     In conformance with CMS regulations, I affirm I was present in the operating room during the entire procedure.    Ernst Breach, MD

## 2021-03-23 NOTE — H&P (Signed)
Date of Surgery Update:  Carl Blair was seen, history and physical examination reviewed, and patient examined by me today. There have been no significant clinical changes since the completion of the previous history and physical.    The risk, benefits, and alternatives of the proposed procedure have been explained to the patient (or appropriate guardian) and understanding verbalized. All questions answered. Patient wishes to proceed.    Electronically signed by: Zola Button, MD,03/23/2021,7:11 AM

## 2021-03-23 NOTE — Progress Notes (Signed)
Pt room ready on the floor, will transfer and given handoff report.     Pt a/o vss. Pt denies pain at this time.

## 2021-03-23 NOTE — Anesthesia Pre-Procedure Evaluation (Signed)
Department of Anesthesiology  Preprocedure Note       Name:  Carl Blair   Age:  80 y.o.  DOB:  1940/09/30                                          MRN:  [id number]         Date:  03/23/2021      Surgeon: Moishe Spice):  Zola Button, MD    Procedure: Procedure(s):  LUMBAR3-LUMBAR4 DECOMPRESSIVE LAMINECTOMY WITH EXCISION OF SYNOVIAL CYST    Medications prior to admission:   Prior to Admission medications    Medication Sig Start Date End Date Taking? Authorizing Provider   metFORMIN (GLUCOPHAGE) 500 MG tablet TAKE 2 TABLETS 2 TIMES     DAILY WITH MEALS 12/24/20   Marcello Fennel, MD   atorvastatin (LIPITOR) 20 MG tablet TAKE 1 TABLET DAILY  Patient taking differently: 20 mg TAKE 1 TABLET DAILY 10/27/20   Marcello Fennel, MD   blood glucose test strips (ACCU-CHEK AVIVA PLUS) strip TEST BLOOD SUGAR ONCE DAILY dx E11.9 05/13/20   Marcello Fennel, MD   Accu-Chek FastClix Lancets MISC Test once daily and prn dx 250.00 05/13/20   Marcello Fennel, MD   omeprazole (PRILOSEC) 40 MG delayed release capsule TAKE 1 CAPSULE DAILY 05/01/20   Marcello Fennel, MD   carvedilol (COREG) 25 MG tablet TAKE 1 TABLET TWICE A DAY 05/01/20   Marcello Fennel, MD   levothyroxine (SYNTHROID) 100 MCG tablet TAKE 1 TABLET DAILY 02/24/20   Marcello Fennel, MD   SITagliptin (JANUVIA) 100 MG tablet TAKE 1 TABLET DAILY 02/24/20   Marcello Fennel, MD   glimepiride (AMARYL) 2 MG tablet TAKE 1 TABLET TWICE DAILY  WITH MEALS 02/24/20   Marcello Fennel, MD   tamsulosin (FLOMAX) 0.4 MG capsule Take 0.4 mg by mouth daily     Historical Provider, MD   metFORMIN (GLUCOPHAGE) 1000 MG tablet TAKE 1 TABLET BY MOUTH  TWICE A DAY WITH MEALS 11/05/18   Marcello Fennel, MD   losartan-hydrochlorothiazide (HYZAAR) 100-25 MG per tablet Take 1 tablet by mouth daily 07/21/17   Marcello Fennel, MD   propafenone (RYTHMOL) 300 MG tablet Take 150 mg by mouth 3 times daily   Patient not taking: No sig reported 04/14/17   [name], MD   Misc Natural Products  (OSTEO BI-FLEX JOINT SHIELD PO) Take by mouth daily    Historical Provider, MD   apixaban (ELIQUIS) 5 MG TABS tablet Take by mouth 2 times daily    Historical Provider, MD   oxybutynin (DITROPAN-XL) 10 MG extended release tablet Take 10 mg by mouth 05/26/15   Historical Provider, MD   Blood Glucose Monitoring Suppl DEVI by Does not apply route. Diagnosis   250.00 10/10/11   [name], MD   aspirin 81 MG EC tablet     Historical Provider, MD       Current medications:    No current facility-administered medications for this visit.     No current outpatient medications on file.     Facility-Administered Medications Ordered in Other Visits   Medication Dose Route Frequency Provider Last Rate Last Admin   ??? 0.9 % sodium chloride infusion   IntraVENous Continuous Zola Button, MD 50 mL/hr at 03/23/21 0654 New Bag at 03/23/21 0654   ??? lidocaine PF 1 %  injection 0.5 mL  0.5 mL IntraDERmal Once Zola Button, MD       ??? ceFAZolin (ANCEF) 2,000 mg in dextrose 5 % 50 mL IVPB (mini-bag)  2,000 mg IntraVENous On Call to OR Zola Button, MD           Allergies:  No Known Allergies    Problem List:    Patient Active Problem List   Diagnosis Code   ??? Contact dermatitis and other eczema, due to unspecified cause L25.9   ??? Chest pain R07.9   ??? Esophageal reflux K21.9   ??? Pain in limb M79.609   ??? Other specified disorders of liver K76.89   ??? Hemorrhoids K64.9   ??? Other, multiple, and unspecified sites, insect bite, nonvenomous, without mention of infection(919.4) W57.Lorne Skeens   ??? Acquired hypothyroidism E03.9   ??? Type 2 diabetes mellitus without complication, without long-term current use of insulin (HCC) E11.9   ??? Essential hypertension I10   ??? Pure hypercholesterolemia E78.00   ??? Chronic systolic congestive heart failure (HCC) I50.22   ??? Dyspnea R06.00   ??? Abdominal hernia K46.9   ??? Elevated CK R74.8   ??? Otalgia of right ear H92.01   ??? Numbness and tingling in right hand R20.0, R20.2   ??? Carpal tunnel syndrome G56.00   ??? Morbid  obesity due to excess calories (HCC) E66.01   ??? Sleep apnea G47.30   ??? Right rib fracture S22.31XA   ??? Bilateral impacted cerumen H61.23   ??? Cataract, left eye H26.9   ??? Urinary incontinence R32   ??? 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 Chronic a-fib (HCC) I48.20   ??? 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 Atrial fibrillation (HCC)    ??? Cardiomyopathy    ??? Chronic a-fib (HCC) 09/06/2017   ??? Hyperlipidemia    ??? Hypertension    ??? Hypothyroidism    ??? 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 Type II or unspecified type diabetes mellitus without mention of complication, not stated as uncontrolled    ??? Unspecified sleep apnea        Past Surgical History:        Procedure Laterality Date   ??? CARDIAC SURGERY  2009    mini maze   ??? CARPAL TUNNEL RELEASE Left 2016   ??? COLONOSCOPY N/A 06/11/2019    COLONOSCOPY DIAGNOSTIC performed by Irish Elders, MD at [facility]   ??? HERNIA REPAIR      Right   ??? SEPTOPLASTY     ??? UPPER GASTROINTESTINAL ENDOSCOPY N/A 06/11/2019    ENTEROSCOPY PUSH BIOPSY performed by Irish Elders, MD at [facility]   ??? UPPER GASTROINTESTINAL ENDOSCOPY N/A 06/11/2019    ENTEROSCOPY WITH INTERVENTION performed by Irish Elders, MD at [facility]   ??? UVULOPALATOPHARYGOPLASTY         Social History:    Social History     Tobacco Use   ??? Smoking status: Never   ??? Smokeless tobacco: Never   Substance Use Topics   ??? Alcohol use: Never                                Counseling given: Not Answered      Vital Signs (Current):   There were no vitals filed  for this visit.                                           BP Readings from Last 3 Encounters:   03/23/21 (!) 173/95   02/22/21 132/86   01/01/21 128/86       NPO Status:                                                                                 BMI:   Wt Readings from Last 3 Encounters:   03/23/21 262 lb 11.2 oz (119.2 kg)   02/22/21 256 lb (116.1 kg)   01/01/21 255 lb (115.7 kg)      There is no height or weight on file to calculate BMI.    CBC:   Lab Results   Component Value Date/Time    WBC 11.0 02/22/2021 09:20 AM    RBC 4.64 02/22/2021 09:20 AM    HGB 12.7 02/22/2021 09:20 AM    HCT 39.5 02/22/2021 09:20 AM    MCV 85.0 02/22/2021 09:20 AM    RDW 15.6 02/22/2021 09:20 AM    PLT 185 02/22/2021 09:20 AM       CMP:   Lab Results   Component Value Date/Time    NA 139 02/22/2021 09:20 AM    K 4.8 02/22/2021 09:20 AM    CL 100 02/22/2021 09:20 AM    CO2 25 02/22/2021 09:20 AM    BUN 22 02/22/2021 09:20 AM    CREATININE 1.1 02/22/2021 09:20 AM    GFRAA >60 03/27/2020 10:02 AM    GFRAA >60 08/15/2011 11:39 AM    AGRATIO 1.7 03/27/2020 10:02 AM    LABGLOM >60 02/22/2021 09:20 AM    GLUCOSE 216 02/22/2021 09:20 AM    GLUCOSE 274 03/08/2016 11:45 AM    PROT 6.4 03/27/2020 10:02 AM    PROT 6.8 05/04/2011 11:46 AM    CALCIUM 9.0 02/22/2021 09:20 AM    BILITOT 0.4 03/27/2020 10:02 AM    ALKPHOS 129 03/27/2020 10:02 AM    AST 11 03/27/2020 10:02 AM    ALT 10 03/27/2020 10:02 AM       POC Tests:   Recent Labs     03/23/21  0643   POCGLU 215*       Coags:   Lab Results   Component Value Date/Time    PROTIME 14.3 02/22/2021 09:20 AM    INR 1.12 02/22/2021 09:20 AM    APTT 43.2 02/22/2021 09:20 AM       HCG (If Applicable): No results found for: PREGTESTUR, PREGSERUM, HCG, HCGQUANT     ABGs: No results found for: PHART, PO2ART, PCO2ART, HCO3ART, BEART, O2SATART     Type & Screen (If Applicable):  No results found for: LABABO, LABRH    Drug/Infectious Status (If Applicable):  No results found for: HIV, HEPCAB    COVID-19 Screening (If Applicable):   Lab Results   Component Value Date/Time    COVID19 Not Detected 11/09/2019 02:53 PM         Anesthesia Evaluation  Patient summary  reviewed and Nursing notes reviewed no history of anesthetic complications:   Airway: Mallampati: II  TM distance: >3 FB   Neck ROM: full  Mouth opening: > = 3 FB   Dental: normal exam         Pulmonary:   (+) shortness of breath:  chronic,  sleep apnea:      (-) asthma                           Cardiovascular:    (+) hypertension:, dysrhythmias (more pronounced with anemia): atrial fibrillation, CHF:, hyperlipidemia    (-)  angina          Echocardiogram reviewed                  Neuro/Psych:      (-) CVA           GI/Hepatic/Renal:   (+) GERD:, liver disease:,           Endo/Other:    (+) DiabetesType II DM, , hypothyroidism, blood dyscrasia: anemia:., .                 Abdominal:             Vascular:     - PVD.      Other Findings:             Anesthesia Plan      general     ASA 3       Induction: intravenous.      Anesthetic plan and risks discussed with patient.      Plan discussed with CRNA.                    Lindwood Qua, MD   03/23/2021

## 2021-03-23 NOTE — Anesthesia Post-Procedure Evaluation (Signed)
Department of Anesthesiology  Postprocedure Note    Patient: Carl Blair  MRN: [id number]  Birthdate: 1940/07/11  Date of evaluation: 03/23/2021      Procedure Summary     Date: 03/23/21 Room / Location: [facility] OR 02 / [facility]    Anesthesia Start: [phone] Anesthesia Stop: 0925    Procedure: BILATERAL LUMBAR3-LUMBAR4 DECOMPRESSIVE LAMINECTOMY WITH EXCISION OF SYNOVIAL CYST (Bilateral: Back) Diagnosis:       Synovial cyst of lumbar spine      Spinal stenosis, lumbar region, with neurogenic claudication      (Synovial cyst of lumbar spine [M71.38])      (Spinal stenosis, lumbar region, with neurogenic claudication [T73.220])    Surgeons: Zola Button, MD Responsible Provider: Lindwood Qua, MD    Anesthesia Type: general ASA Status: 3          Anesthesia Type: No value filed.    Aldrete Phase I: Aldrete Score: 8    Aldrete Phase II:        Anesthesia Post Evaluation    Patient location during evaluation: PACU  Patient participation: complete - patient participated  Level of consciousness: awake  Airway patency: patent  Nausea & Vomiting: no vomiting and no nausea  Complications: no  Cardiovascular status: hemodynamically stable  Respiratory status: acceptable  Hydration status: stable  Multimodal analgesia pain management approach

## 2021-03-23 NOTE — Consults (Signed)
Consult -Internal Medicine  Dr. Cathie Hoops  03/23/2021      PCP: Marcello Fennel, MD  Referring Physician: Zola Button, MD    Code Status: No Order  Current Diet: No diet orders on file      Cc: Carl Blair is a89 y.o. male who presents with Spinal stenosis of lumbar region with neurogenic claudication.      Problem list of hospitalization thus far:  Active Hospital Problems    Diagnosis Date Noted    Spinal stenosis of lumbar region with neurogenic claudication [M48.062] 02/22/2021     Priority: Medium    Esophageal reflux [K21.9] 10/08/2008    Type 2 diabetes mellitus without complication, without long-term current use of insulin (HCC) [E11.9] 10/08/2008    Acquired hypothyroidism [E03.9] 10/08/2008    Essential hypertension [I10] 10/08/2008     HPI: Carl Blair has been admitted by Zola Button, MD with lower back pain.      Pt presents with with above complaint.    Pt has has severe pain to the lumbar spine  Duration - going on for at least 1 year(s)  It is described as a constant pain that is aching in quality.  Severity rated as 8 out of 10 at its worst  Pain is so severe that it limits usual activities  No improvement with medications, therapy or injections  As it is not improved with conservative measures, surgery has been recommended    Pt has undergone BILATERAL LUMBAR3-LUMBAR4 DECOMPRESSIVE LAMINECTOMY WITH EXCISION OF SYNOVIAL CYST without any apparent complications.  Pt is doing well post operatively.  Pain is controlled at this time.  I have been asked to see the patient for evaluation of his internal medicine issues:  has a past medical history of Atrial fibrillation (HCC), Cardiomyopathy, Chronic a-fib (HCC), Hyperlipidemia, Hypertension, Hypothyroidism, Ptosis of both eyelids, Type II or unspecified type diabetes mellitus without mention of complication, not stated as uncontrolled, and Unspecified sleep apnea..  Home meds have been reveiwed and restartedas indicated.  Pt denies having other  complaints at this time.    Pt has been evaluated post operatively  Pain does appear to be controlled - on iv meds  Patient has not worked with therapy at this time      Electronic chart reviewed  Home meds reviewed and restarted as indicated  Op note, anesthesia flowsheet reviewed  Case d/w nursing    Doing ok post op  Pain controlled  Has not yet worked with therapy       Review of Systems: (1 system for EPF, 2-9 for detailed, 10+ for comprehensive)  Constitutional: Negative for chills and fever.     HENT: Negative for ear pain and mouth sores.      Eyes: Negative for pain, redness and visual disturbance.     Respiratory: Negative for cough, shortness of breath and wheezing.      Cardiovascular: Negative for chest pain and leg swelling.     Gastrointestinal: Negative for diarrhea, nausea and vomiting.     Endocrine: Negative for polydipsia and polyphagia.     Genitourinary: Negative for frequency and urgency.     Musculoskeletal: Negative for  neck pain.   Positive for back pain  Skin: Negative for color change.     Allergic/Immunologic: Negative for food allergies.     Neurological: Negative for seizures and syncope.     Hematological: Does not bruise/bleed easily.     Psychiatric/Behavioral: Negative for confusion. The patient is not  nervous/anxious.             Past Medical History:   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    Type II or unspecified type diabetes mellitus without mention of complication, not stated as uncontrolled     Unspecified sleep apnea        Past Surgical History:   Past Surgical History:   Procedure Laterality Date    CARDIAC SURGERY  2009    mini maze    CARPAL TUNNEL RELEASE Left 2016    COLONOSCOPY N/A 06/11/2019    COLONOSCOPY DIAGNOSTIC performed by Irish Elders, MD at [facility] ENDOSCOPY    HERNIA REPAIR      Right     SEPTOPLASTY      UPPER GASTROINTESTINAL ENDOSCOPY N/A 06/11/2019    ENTEROSCOPY PUSH BIOPSY performed by Irish Elders, MD at [facility] ENDOSCOPY    UPPER GASTROINTESTINAL ENDOSCOPY N/A 06/11/2019    ENTEROSCOPY WITH INTERVENTION performed by Irish Elders, MD at [facility] ENDOSCOPY    UVULOPALATOPHARYGOPLASTY         Social History:   Social History       Tobacco History       Smoking Status  Never      Smokeless Tobacco Use  Never              Alcohol History       Alcohol Use Status  Never              Drug Use       Drug Use Status  No              Sexual Activity       Sexually Active  Yes Partners  Male                    Fam History:   Family History   Problem Relation Age of Onset    Diabetes Mother     Hypertension Mother     Diabetes Sister     Coronary Art Dis Sister     Diabetes Brother     Coronary Art Dis Brother     Hypertension Brother     Asthma Sister        PFSH: The above PMHx, PSHx, SocHx, FamHx has been reviewed by myself. (1 area for detailed, 2-3 for comprehensive)      Code Status: No Order    Meds - following list ofhome medications from electronic chart has been reviewed by myself  Prior to Admission medications    Medication Sig Start Date End Date Taking? Authorizing Provider   metFORMIN (GLUCOPHAGE) 500 MG tablet TAKE 2 TABLETS 2 TIMES     DAILY WITH MEALS 12/24/20   Marcello Fennel, MD   atorvastatin (LIPITOR) 20 MG tablet TAKE 1 TABLET DAILY  Patient taking differently: 20 mg TAKE 1 TABLET DAILY 10/27/20   Marcello Fennel, MD   blood glucose test strips (ACCU-CHEK AVIVA PLUS) strip TEST BLOOD SUGAR ONCE DAILY dx E11.9 05/13/20   Marcello Fennel, MD   Accu-Chek FastClix Lancets MISC Test once daily and prn dx 250.00 05/13/20   Marcello Fennel, MD   omeprazole (PRILOSEC) 40 MG delayed release  capsule TAKE 1 CAPSULE DAILY 05/01/20   Marcello Fennel, MD   carvedilol (COREG) 25 MG tablet TAKE 1 TABLET TWICE A DAY 05/01/20   Marcello Fennel, MD   levothyroxine (SYNTHROID) 100 MCG tablet TAKE 1 TABLET  DAILY 02/24/20   Marcello Fennel, MD   SITagliptin (JANUVIA) 100 MG tablet TAKE 1 TABLET DAILY 02/24/20   Marcello Fennel, MD   glimepiride (AMARYL) 2 MG tablet TAKE 1 TABLET TWICE DAILY  WITH MEALS 02/24/20   Marcello Fennel, MD   tamsulosin (FLOMAX) 0.4 MG capsule Take 0.4 mg by mouth daily     Historical Provider, MD   metFORMIN (GLUCOPHAGE) 1000 MG tablet TAKE 1 TABLET BY MOUTH  TWICE A DAY WITH MEALS 11/05/18   Marcello Fennel, MD   losartan-hydrochlorothiazide (HYZAAR) 100-25 MG per tablet Take 1 tablet by mouth daily 07/21/17   Marcello Fennel, MD   propafenone (RYTHMOL) 300 MG tablet Take 150 mg by mouth 3 times daily   Patient not taking: No sig reported 04/14/17   [name], MD   Misc Natural Products (OSTEO BI-FLEX JOINT SHIELD PO) Take by mouth daily    Historical Provider, MD   apixaban (ELIQUIS) 5 MG TABS tablet Take by mouth 2 times daily    Historical Provider, MD   oxybutynin (DITROPAN-XL) 10 MG extended release tablet Take 10 mg by mouth 05/26/15   Historical Provider, MD   Blood Glucose Monitoring Suppl DEVI by Does not apply route. Diagnosis   250.00 10/10/11   [name], MD   aspirin 81 MG EC tablet     Historical Provider, MD         No Known Allergies          EXAM: (2-7 system forEPF/Detailed, ?8 for Comprehensive)  BP (!) 168/94    Pulse 90    Temp 98.7 ??F (37.1 ??C) (Temporal)    Resp 24    Ht 6' (1.829 m)    Wt 262 lb 11.2 oz (119.2 kg)    SpO2 98%    BMI 35.63 kg/m??   Constitutional: vitals as above: alert, appears stated age and cooperative    Psychiatric: normal insight and judgment, oriented to person, place, time, and general circumstances    Head: Normocephalic, without obvious abnormality, atraumatic    Eyes:lids and lashes normal, conjunctivae and sclerae normal and pupils equal, round, reactive to light and accomodation    EMNT: external ears normal, nares midline    Neck: no carotid bruit, supple, symmetrical, trachea midline and thyroid not enlarged, symmetric, no  tenderness/mass/nodules     Respiratory: clear to auscultation and percussion bilaterally with normal respiratory effort    Cardiovascular: normal rate, regular rhythm, normal S1 and S2 and no murmurs    Gastrointestinal: soft, non-tender, non-distended, normal bowel sounds, no masses or organomegaly    Extremities: no clubbing, no edema    Skin:No rashes or nodules noted.    Neurologic:negative           LABS:  Labs Reviewed   POCT GLUCOSE - Abnormal; Notable for the following components:       Result Value    POC Glucose 215 (*)     All other components within normal limits   POCT GLUCOSE - Abnormal; Notable for the following components:    POC Glucose 245 (*)     All other components within normal limits         IMAGING:  Imaging has been  reviewed in the computerized chart  XR LUMBAR SPINE (2-3 VIEWS)    Result Date: 03/23/2021  Radiology exam is complete. No Radiologist dictation. Please follow up with ordering provider.         EKG: reviewed if available      Lab Results   Component Value Date/Time    GLUCOSE 216 02/22/2021 09:20 AM    GLUCOSE 274 03/08/2016 11:45 AM     Lab Results   Component Value Date/Time    POCGLU 245 03/23/2021 09:46 AM     BP (!) 168/94    Pulse 90    Temp 98.7 ??F (37.1 ??C) (Temporal)    Resp 24    Ht 6' (1.829 m)    Wt 262 lb 11.2 oz (119.2 kg)    SpO2 98%    BMI 35.63 kg/m??     MEDICAL DECISION MAKING:    Active Problems:    Spinal stenosis of lumbar region with neurogenic claudication -New Problem to me.  Doing ok immediately post op  Plan: Continue on post-operative pathway.  PT/OT to see patient.  Continue pain control - on IV pain meds acutely post op. Work on transitioning to oral pain meds when possible.  Will follow serial h/h to monitor for acute blood loss anemia - labs ordered for tomorrow.     Esophageal reflux -Established problem. Stable.    Plan: cont on ppi    Acquired hypothyroidism -Established problem. Stable.    Plan: stay on synthroid    Type 2 diabetes mellitus  without complication, without long-term current use of insulin (HCC) -Established problem. Stable.  Glucose ok  Plan: Patient placed on controlled carbohydrate diet. Fingerstick sugars to be checked to monitor for both hypoglycemia as well as hyperglycemia.  Sliding scale insulin ordered.  Glucagon and dextrose ordered for hypoglycemia.  Patient will be continued on home medications. Hemoglobin a1c to be ordered to assess efficacy of therapy.     Essential hypertension -Established problem. Elevated now 168/94  Plan: Pt home BP meds reviewed and will be continued. IV Hydralazine ordered for control of extremely high blood pressures.   Will monitor labs to assess Creat/K for possible complications of medications. Continue narcotics as adequate pain control can assist with adequate BP control.         Diagnoses as listed above, designated as new or established and plan outlined for each.     Data Reviewed:   (1) Lab tests were reviewed or ordered.   (1) Radiology tests were reviewed or ordered.  (1) Medical test (Echo, EKG, PFT/spiro) were not ordered.  (1) History was not obtained from someone other than patient  (1) Old records  were reviewed - see HPI/MDM for pertinent details if review done.  (2) Case was discussed with another health care provider: Dr Gillian Shields  (2) Imaging was viewed by myself.    (2) EKG  was not viewed by myself.       Thanks for the consult!  Will follow along daily while patient is in house.      (Please note that portions of this note were completed with a voice recognition program.  Efforts were made to edit the dictations but occasionally words are mis-transcribed.)      Barb Merino, MD  03/23/2021

## 2021-03-24 LAB — HEMOGLOBIN AND HEMATOCRIT
Hematocrit: 34.7 % — ABNORMAL LOW (ref 40.5–52.5)
Hemoglobin: 11.1 g/dL — ABNORMAL LOW (ref 13.5–17.5)

## 2021-03-24 MED ORDER — OXYCODONE HCL 5 MG PO TABS
5 MG | ORAL_TABLET | Freq: Three times a day (TID) | ORAL | 0 refills | Status: AC | PRN
Start: 2021-03-24 — End: 2021-03-27

## 2021-03-24 MED ORDER — APIXABAN 5 MG PO TABS
5 MG | ORAL_TABLET | Freq: Two times a day (BID) | ORAL | 0 refills | Status: AC
Start: 2021-03-24 — End: 2021-12-21

## 2021-03-24 MED ORDER — ASPIRIN 81 MG PO TBEC
81 MG | ORAL_TABLET | Freq: Every day | ORAL | 0 refills | Status: AC
Start: 2021-03-24 — End: ?

## 2021-03-24 MED FILL — LEVOTHYROXINE SODIUM 100 MCG PO TABS: 100 MCG | ORAL | Qty: 1

## 2021-03-24 MED FILL — PANTOPRAZOLE SODIUM 40 MG PO TBEC: 40 MG | ORAL | Qty: 1

## 2021-03-24 MED FILL — ATORVASTATIN CALCIUM 20 MG PO TABS: 20 MG | ORAL | Qty: 1

## 2021-03-24 MED FILL — GLIPIZIDE 5 MG PO TABS: 5 MG | ORAL | Qty: 1

## 2021-03-24 MED FILL — CARVEDILOL 6.25 MG PO TABS: 6.25 MG | ORAL | Qty: 4

## 2021-03-24 MED FILL — HEALTHYLAX 17 G PO PACK: 17 g | ORAL | Qty: 1

## 2021-03-24 MED FILL — MAPAP 325 MG PO TABS: 325 MG | ORAL | Qty: 2

## 2021-03-24 MED FILL — KCL IN DEXTROSE-NACL 20-5-0.45 MEQ/L-%-% IV SOLN: INTRAVENOUS | Qty: 1000

## 2021-03-24 MED FILL — METFORMIN HCL 500 MG PO TABS: 500 MG | ORAL | Qty: 2

## 2021-03-24 MED FILL — ALOGLIPTIN BENZOATE 25 MG PO TABS: 25 MG | ORAL | Qty: 1

## 2021-03-24 MED FILL — VANCOMYCIN HCL 1 G IV SOLR: 1 g | INTRAVENOUS | Qty: 1500

## 2021-03-24 MED FILL — VANCOMYCIN HCL 10 G IV SOLR: 10 g | INTRAVENOUS | Qty: 1500

## 2021-03-24 MED FILL — LOSARTAN POTASSIUM 100 MG PO TABS: 100 MG | ORAL | Qty: 1

## 2021-03-24 MED FILL — HYDROCHLOROTHIAZIDE 25 MG PO TABS: 25 MG | ORAL | Qty: 1

## 2021-03-24 MED FILL — FLOMAX 0.4 MG PO CAPS: 0.4 MG | ORAL | Qty: 1

## 2021-03-24 NOTE — Discharge Summary (Signed)
Discharge Summary    Date of Admission: 03/23/2021  5:48 AM  Date of Discharge: 03/24/2021  Admission Diagnosis:   Patient Active Problem List   Diagnosis    Contact dermatitis and other eczema, due to unspecified cause    Chest pain    Esophageal reflux    Pain in limb    Other specified disorders of liver    Hemorrhoids    Other, multiple, and unspecified sites, insect bite, nonvenomous, without mention of infection(919.4)    Acquired hypothyroidism    Type 2 diabetes mellitus without complication, without long-term current use of insulin (HCC)    Essential hypertension    Pure hypercholesterolemia    Chronic systolic congestive heart failure (HCC)    Dyspnea    Abdominal hernia    Elevated CK    Otalgia of right ear    Numbness and tingling in right hand    Carpal tunnel syndrome    Morbid obesity due to excess calories (HCC)    Sleep apnea    Right rib fracture    Bilateral impacted cerumen    Cataract, left eye    Urinary incontinence    Ptosis of both eyelids    Chronic a-fib ([place])    Spinal stenosis of lumbar region with neurogenic claudication    Acute blood loss as cause of postoperative anemia     Discharge Diagnosis: Same   Condition on Discharge: good  Attending for Admission: Dr. Trinna Post  Procedures: 1.  Bilateral decompressive laminectomy, L34  and synovial cyst excision with  nerve root foraminotomies.  2. Use of operating microscope for microsurgical dissection    Hospital Course: Carl Blair is a 80 y.o. male patient with intractable lower extremity radicular pain.  Imaging showed stenosis due to synovial cyst.  Failing to improve with nonsurgical modalities.  He underwent the procedure listed above on date of admission.   After surgery, His pre-operative radicular pain was improved. He complained of incisional pain. The pain was well-controlled on oral medications. The dressing was clean, dry and intact. There was no erythema or edema around the surgical site. Prior to discharge He was eating  well, urinating and ambulating with a steady gait with PT/OT.     Discharge Vitals/Labs: BP 134/77    Pulse 84    Temp 97.9 ??F (36.6 ??C) (Oral)    Resp 18    Ht 6' (1.829 m)    Wt 266 lb 11.2 oz (121 kg)    SpO2 94%    BMI 36.17 kg/m??   CBC with Differential:    Lab Results   Component Value Date/Time    WBC 11.0 02/22/2021 09:20 AM    RBC 4.64 02/22/2021 09:20 AM    HGB 11.1 03/24/2021 04:42 AM    HCT 34.7 03/24/2021 04:42 AM    PLT 185 02/22/2021 09:20 AM    MCV 85.0 02/22/2021 09:20 AM    MCH 27.4 02/22/2021 09:20 AM    MCHC 32.3 02/22/2021 09:20 AM    RDW 15.6 02/22/2021 09:20 AM    NRBC 0 11/10/2019 05:39 AM    SEGSPCT 43.4 11/10/2019 05:39 AM    BANDSPCT 1.0 06/09/2010 11:15 AM    LYMPHOPCT 37.8 02/22/2021 09:20 AM    MONOPCT 7.4 02/22/2021 09:20 AM    EOSPCT 3.0 06/09/2010 11:15 AM    BASOPCT 0.6 02/22/2021 09:20 AM    MONOSABS 0.8 02/22/2021 09:20 AM    LYMPHSABS 4.2 02/22/2021 09:20 AM    EOSABS 0.6  02/22/2021 09:20 AM    BASOSABS 0.1 02/22/2021 09:20 AM    DIFFTYPE Manual-K 06/09/2010 11:15 AM     CMP:    Lab Results   Component Value Date/Time    NA 139 02/22/2021 09:20 AM    K 4.8 02/22/2021 09:20 AM    CL 100 02/22/2021 09:20 AM    CO2 25 02/22/2021 09:20 AM    BUN 22 02/22/2021 09:20 AM    CREATININE 1.1 02/22/2021 09:20 AM    GFRAA >60 03/27/2020 10:02 AM    GFRAA >60 08/15/2011 11:39 AM    AGRATIO 1.7 03/27/2020 10:02 AM    LABGLOM >60 02/22/2021 09:20 AM    GLUCOSE 216 02/22/2021 09:20 AM    GLUCOSE 274 03/08/2016 11:45 AM    PROT 6.4 03/27/2020 10:02 AM    PROT 6.8 05/04/2011 11:46 AM    LABALBU 4.4 02/22/2021 09:20 AM    CALCIUM 9.0 02/22/2021 09:20 AM    BILITOT 0.4 03/27/2020 10:02 AM    ALKPHOS 129 03/27/2020 10:02 AM    AST 11 03/27/2020 10:02 AM    ALT 10 03/27/2020 10:02 AM      Discharge Medications:      Medication List        START taking these medications      oxyCODONE 5 MG immediate release tablet  Commonly known as: ROXICODONE  Take 1 tablet by mouth every 8 hours as needed for Pain  for up to 3 days.            CHANGE how you take these medications      apixaban 5 MG Tabs tablet  Commonly known as: ELIQUIS  Take 1 tablet by mouth 2 times daily  Start taking on: March 29, 2021  What changed:   how much to take  These instructions start on March 29, 2021. If you are unsure what to do until then, ask your doctor or other care provider.     aspirin 81 MG EC tablet  Take 1 tablet by mouth daily  Start taking on: March 30, 2021  What changed:   how much to take  how to take this  when to take this  These instructions start on March 30, 2021. If you are unsure what to do until then, ask your doctor or other care provider.     atorvastatin 20 MG tablet  Commonly known as: LIPITOR  TAKE 1 TABLET DAILY  What changed: how much to take            CONTINUE taking these medications      Accu-Chek Aviva Plus strip  Generic drug: blood glucose test strips  TEST BLOOD SUGAR ONCE DAILY dx E11.9     Accu-Chek FastClix Lancets Misc  Test once daily and prn dx 250.00     blood glucose monitor kit and supplies  by Does not apply route. Diagnosis   250.00     carvedilol 25 MG tablet  Commonly known as: COREG  TAKE 1 TABLET TWICE A DAY     glimepiride 2 MG tablet  Commonly known as: AMARYL  TAKE 1 TABLET TWICE DAILY  WITH MEALS     levothyroxine 100 MCG tablet  Commonly known as: Synthroid  TAKE 1 TABLET DAILY     losartan-hydroCHLOROthiazide 100-25 MG per tablet  Commonly known as: HYZAAR  Take 1 tablet by mouth daily     * metFORMIN 1000 MG tablet  Commonly known as: GLUCOPHAGE  TAKE 1 TABLET  BY MOUTH  TWICE A DAY WITH MEALS     * metFORMIN 500 MG tablet  Commonly known as: GLUCOPHAGE  TAKE 2 TABLETS 2 TIMES     DAILY WITH MEALS     omeprazole 40 MG delayed release capsule  Commonly known as: PRILOSEC  TAKE 1 CAPSULE DAILY     OSTEO BI-FLEX JOINT SHIELD PO     oxybutynin 10 MG extended release tablet  Commonly known as: DITROPAN-XL     SITagliptin 100 MG tablet  Commonly known as: Januvia  TAKE 1 TABLET  DAILY     tamsulosin 0.4 MG capsule  Commonly known as: FLOMAX           * This list has 2 medication(s) that are the same as other medications prescribed for you. Read the directions carefully, and ask your doctor or other care provider to review them with you.                ASK your doctor about these medications      propafenone 300 MG tablet  Commonly known as: RYTHMOL               Where to Get Your Medications        These medications were sent to [facility] [id number] [place], [place], [place] [address]      Phone: [phone]   apixaban 5 MG Tabs tablet  oxyCODONE 5 MG immediate release tablet       Information about where to get these medications is not yet available    Ask your nurse or doctor about these medications  aspirin 81 MG EC tablet       Discharge Destination: The patient was discharged to Home.     Follow-up: The patient is to follow-up with our office in the office in 2 days for JP drain removal  Discharge Instructions: Verbal and written discharge instructions as well as dressing change instructions were given to the patient at the time of consent. No anticoagulation for 1 week post-operatively. No driving. No lifting or bending.      Juliane Poot, APRN - CNP  03/24/2021

## 2021-03-24 NOTE — Plan of Care (Signed)
Problem: Chronic Conditions and Co-morbidities  Goal: Patient's chronic conditions and co-morbidity symptoms are monitored and maintained or improved  03/24/2021 1554 by Ander Slade, RN  Outcome: Completed  03/24/2021 0839 by Lawana Pai, RN  Outcome: Progressing     Problem: Discharge Planning  Goal: Discharge to home or other facility with appropriate resources  03/24/2021 1554 by Ander Slade, RN  Outcome: Completed  03/24/2021 0839 by Lawana Pai, RN  Outcome: Progressing     Problem: Pain  Goal: Verbalizes/displays adequate comfort level or baseline comfort level  03/24/2021 1554 by Ander Slade, RN  Outcome: Completed  03/24/2021 0839 by Lawana Pai, RN  Outcome: Progressing

## 2021-03-24 NOTE — Progress Notes (Signed)
Progress Note - Dr. Cathie Hoops - Internal Medicine  PCP: Marcello Fennel, MD [address] / [place] [address] [phone]    Hospital Day: 0  Code Status: Full Code  Current Diet: ADULT DIET; Regular; 3 carb choices (45 gm/meal)        CC: follow up on medical issues    Subjective:   Carl Blair is a 80 y.o. male.    Pt seen and examined  Chart reviewed since last visit, labs and imaging below      Doing ok  Slept fine  States pain is controlled  Has not yet worked with therapy  No issues noted      Review of Systems: (1 system for EPF, 2-9 for detailed, 10+ for comprehensive)  Constitutional: Negative for chills and fever.     HENT: Negative for dental problem, nosebleeds and rhinorrhea.      Eyes: Negative for photophobia and visual disturbance.     Respiratory: Negative for cough, chest tightness and shortness of breath.      Cardiovascular: Negative for chest pain and leg swelling.     Gastrointestinal: Negative for diarrhea, nausea and vomiting.     Endocrine: Negative for polydipsia and polyphagia.     Genitourinary: Negative for frequency, hematuria and urgency.     Musculoskeletal: Negative for  myalgias.   Positive for back pain  Skin: Negative for rash.     Allergic/Immunologic: Negative for food allergies.     Neurological: Negative for dizziness, seizures, syncope and facial asymmetry.     Hematological: Negative for adenopathy.     Psychiatric/Behavioral: Negative for dysphoric mood. The patient is not nervous/anxious.        I have reviewed the patient's medical and social history in detail and updated the computerized patient record.  To recap: He  has a past medical history of Atrial fibrillation (HCC), Cardiomyopathy, Chronic a-fib (HCC), Hyperlipidemia, Hypertension, Hypothyroidism, Ptosis of both eyelids, Type II or unspecified type diabetes mellitus without mention of complication, not stated as uncontrolled, and Unspecified sleep apnea.. He  has a past surgical history that includes  hernia repair; Uvulopalatopharygoplasty; Septoplasty; Cardiac surgery (2009); Carpal tunnel release (Left, 2016); Upper gastrointestinal endoscopy (N/A, 06/11/2019); Colonoscopy (N/A, 06/11/2019); Upper gastrointestinal endoscopy (N/A, 06/11/2019); and Lumbar spine surgery (Bilateral, 03/23/2021).Marland Kitchen He  reports that he has never smoked. He has never used smokeless tobacco. He reports that he does not drink alcohol and does not use drugs..        Active Hospital Problems    Diagnosis Date Noted    Acute blood loss as cause of postoperative anemia [D62] 03/24/2021     Priority: Medium    Spinal stenosis of lumbar region with neurogenic claudication [M48.062] 02/22/2021     Priority: Medium    Esophageal reflux [K21.9] 10/08/2008    Type 2 diabetes mellitus without complication, without long-term current use of insulin (HCC) [E11.9] 10/08/2008    Acquired hypothyroidism [E03.9] 10/08/2008    Essential hypertension [I10] 10/08/2008       Current Facility-Administered Medications: atorvastatin (LIPITOR) tablet 20 mg, 20 mg, Oral, Daily  carvedilol (COREG) tablet 25 mg, 25 mg, Oral, BID  levothyroxine (SYNTHROID) tablet 100 mcg, 100 mcg, Oral, Daily  metFORMIN (GLUCOPHAGE) tablet 1,000 mg, 1,000 mg, Oral, BID WC  tamsulosin (FLOMAX) capsule 0.4 mg, 0.4 mg, Oral, Daily  dextrose 5 % and 0.45 % NaCl with KCl 20 mEq infusion, 1,000 mL, IntraVENous, Continuous  sodium chloride flush 0.9 % injection  5-40 mL, 5-40 mL, IntraVENous, 2 times per day  sodium chloride flush 0.9 % injection 5-40 mL, 5-40 mL, IntraVENous, PRN  0.9 % sodium chloride infusion, , IntraVENous, PRN  acetaminophen (TYLENOL) tablet 650 mg, 650 mg, Oral, Q6H  ondansetron (ZOFRAN-ODT) disintegrating tablet 4 mg, 4 mg, Oral, Q8H PRN **OR** ondansetron (ZOFRAN) injection 4 mg, 4 mg, IntraVENous, Q6H PRN  oxyCODONE (ROXICODONE) immediate release tablet 5 mg, 5 mg, Oral, Q4H PRN **OR** oxyCODONE (ROXICODONE) immediate release tablet 10 mg, 10 mg, Oral, Q4H  PRN  methocarbamol (ROBAXIN) tablet 500 mg, 500 mg, Oral, Q8H PRN **OR** methocarbamol (ROBAXIN) 500 mg in dextrose 5 % 100 mL IVPB, 500 mg, IntraVENous, Q8H PRN  polyethylene glycol (GLYCOLAX) packet 17 g, 17 g, Oral, Daily  bisacodyl (DULCOLAX) EC tablet 5 mg, 5 mg, Oral, Daily PRN  losartan (COZAAR) tablet 100 mg, 100 mg, Oral, Daily **AND** hydroCHLOROthiazide (HYDRODIURIL) tablet 25 mg, 25 mg, Oral, Daily  pantoprazole (PROTONIX) tablet 40 mg, 40 mg, Oral, QAM  alogliptin (NESINA) tablet 25 mg, 25 mg, Oral, Daily  glipiZIDE (GLUCOTROL) tablet 5 mg, 5 mg, Oral, BID         Objective:  BP 121/75    Pulse 87    Temp 97.5 ??F (36.4 ??C) (Oral)    Resp 18    Ht 6' (1.829 m)    Wt 266 lb 11.2 oz (121 kg)    SpO2 94%    BMI 36.17 kg/m??      Patient Vitals for the past 24 hrs:   BP Temp Temp src Pulse Resp SpO2 Height Weight   03/24/21 0806 -- -- -- -- -- -- -- 266 lb 11.2 oz (121 kg)   03/24/21 0400 121/75 97.5 ??F (36.4 ??C) Oral 87 18 94 % -- --   03/23/21 2330 (!) 156/93 97.2 ??F (36.2 ??C) Oral 89 18 95 % -- --   03/23/21 2200 (!) 155/73 97.4 ??F (36.3 ??C) Oral 92 18 97 % -- --   03/23/21 1759 -- -- -- (!) 112 18 97 % -- --   03/23/21 1638 -- -- -- -- -- -- 6' (1.829 m) 262 lb (118.8 kg)   03/23/21 1552 (!) 174/93 97.3 ??F (36.3 ??C) Oral 96 17 98 % -- --   03/23/21 1520 133/87 97 ??F (36.1 ??C) Temporal 91 20 -- -- --   03/23/21 1510 (!) 160/106 -- -- 86 19 -- -- --   03/23/21 1500 (!) 153/99 -- -- 92 18 -- -- --   03/23/21 1450 -- -- -- 99 20 -- -- --   03/23/21 1440 (!) 141/104 -- -- 87 18 -- -- --   03/23/21 1430 (!) 145/100 -- -- 99 21 -- -- --   03/23/21 1400 (!) 169/85 -- -- 92 23 97 % -- --   03/23/21 1330 (!) 168/94 -- -- 90 24 98 % -- --   03/23/21 1300 (!) 170/96 -- -- 94 24 98 % -- --   03/23/21 1230 (!) 159/98 -- -- 94 20 97 % -- --   03/23/21 1200 (!) 143/100 -- -- 97 17 90 % -- --   03/23/21 1130 (!) 143/97 -- -- 95 19 94 % -- --   03/23/21 1100 (!) 155/85 -- -- 86 24 98 % -- --   03/23/21 1045 (!) 159/88 --  -- (!) 101 21 98 % -- --   03/23/21 1030 (!) 164/92 -- -- (!) 102 19 99 % -- --   03/23/21  1015 (!) 160/97 -- -- 83 19 99 % -- --   03/23/21 1000 (!) 165/97 -- -- 78 18 95 % -- --   03/23/21 0945 (!) 169/98 -- -- 81 16 94 % -- --   03/23/21 0940 -- -- -- 82 16 92 % -- --   03/23/21 0935 (!) 159/81 -- -- 81 20 90 % -- --   03/23/21 0930 (!) 152/89 -- -- 85 14 94 % -- --   03/23/21 0925 (!) 139/96 -- -- 84 14 97 % -- --   03/23/21 0922 134/84 98.7 ??F (37.1 ??C) Temporal 82 12 92 % -- --     Patient Vitals for the past 96 hrs (Last 3 readings):   Weight   03/24/21 0806 266 lb 11.2 oz (121 kg)   03/23/21 1638 262 lb (118.8 kg)   03/23/21 0616 262 lb 11.2 oz (119.2 kg)           Intake/Output Summary (Last 24 hours) at 03/24/2021 0836  Last data filed at 03/24/2021 0458  Gross per 24 hour   Intake 1200 ml   Output 2960 ml   Net -1760 ml         Physical Exam: (2-7 system for EPF/Detailed, ?8 for Comprehensive)  BP 121/75    Pulse 87    Temp 97.5 ??F (36.4 ??C) (Oral)    Resp 18    Ht 6' (1.829 m)    Wt 266 lb 11.2 oz (121 kg)    SpO2 94%    BMI 36.17 kg/m??   Constitutional: vitals as above: alert, appears stated age and cooperative    Psychiatric: normal insight and judgment, oriented to person, place, time, and general circumstances    Head: Normocephalic, without obvious abnormality, atraumatic    Eyes:lids and lashes normal, conjunctivae and sclerae normal and pupils equal, round, reactive to light and accomodation    EMNT: external ears normal, nares midline    Neck: no carotid bruit, supple, symmetrical, trachea midline and thyroid not enlarged, symmetric, no tenderness/mass/nodules     Respiratory: clear to auscultation and percussion bilaterally with normal respiratory effort    Cardiovascular: normal rate, regular rhythm, normal S1 and S2 and no murmurs    Gastrointestinal: soft, non-tender, non-distended, normal bowel sounds, no masses or organomegaly    Extremities: no clubbing, no edema    Skin:No rashes or  nodules noted.    Neurologic:negative         Labs:  Lab Results   Component Value Date    WBC 11.0 02/22/2021    HGB 11.1 (L) 03/24/2021    HCT 34.7 (L) 03/24/2021    PLT 185 02/22/2021    CHOL 150 03/27/2020    TRIG 108 03/27/2020    HDL 31 (L) 03/27/2020    ALT 10 03/27/2020    AST 11 (L) 03/27/2020    NA 139 02/22/2021    K 4.8 02/22/2021    CL 100 02/22/2021    CREATININE 1.1 02/22/2021    BUN 22 (H) 02/22/2021    CO2 25 02/22/2021    TSH 4.29 (H) 03/27/2020    PSA 0.48 03/27/2020    INR 1.12 02/22/2021    LABA1C 8.8 01/01/2021    LABMICR 4.60 (H) 03/27/2020     Lab Results   Component Value Date    CKTOTAL 72 04/14/2017       Recent Imaging Results are Reviewed:  XR LUMBAR SPINE (2-3 VIEWS)    Result Date: 03/23/2021  Radiology exam is complete. No Radiologist dictation. Please follow up with ordering provider.       Lab Results   Component Value Date/Time    GLUCOSE 216 02/22/2021 09:20 AM    GLUCOSE 274 03/08/2016 11:45 AM     Lab Results   Component Value Date/Time    POCGLU 245 03/23/2021 09:46 AM     BP 121/75    Pulse 87    Temp 97.5 ??F (36.4 ??C) (Oral)    Resp 18    Ht 6' (1.829 m)    Wt 266 lb 11.2 oz (121 kg)    SpO2 94%    BMI 36.17 kg/m??     Assessment and Plan:  Principal Problem:    Spinal stenosis of lumbar region with neurogenic claudication -Established problem. Stable.    Plan:  stay on post op pathway. To work with pt/ot today. Transition to oral pain meds. Cont to follow h/h to assess post op anemia.   Active Problems:    Acute blood loss as cause of postoperative anemia -New Problem to me.  Hgb 11.1  Plan: No indication for transfusion. Cont to monitor h/h to assess progression of anemia.  Recommend ferrous sulfate or MVI as outpatient.     Esophageal reflux -Established problem. Stable.    Plan: cont on ppi    Acquired hypothyroidism -Established problem. Stable.  Denies sx  Plan: stay on synthroid    Type 2 diabetes mellitus without complication, without long-term current use of insulin  (HCC) -Established problem. Stable.  FSBS 245  Plan: cont ccc diet, sliding scale, home meds    Essential hypertension -Established problem. Stable.  121/75  Plan: cont home meds    Disp - medically stable      (Please note that portions of this note were completed with a voice recognition program.  Efforts were made to edit the dictations but occasionally words are mis-transcribed.)        Barb Merino, MD  03/24/2021

## 2021-03-24 NOTE — Progress Notes (Signed)
[facility] - Inpatient Rehabilitation Department   Phone: [phone]    Physical Therapy    [x]  Initial Evaluation            []  Daily Treatment Note         []  Discharge Summary      Patient: Carl Blair   DOB: 20-Apr-1940   MRN:   Date of Service:  03/24/2021  Admitting Diagnosis: Spinal stenosis of lumbar region with neurogenic claudication  Current Admission Summary: s/p L3/4 lumbar laminectomy with synovial cyst removal  Past Medical History:  has a past medical history of Atrial fibrillation (HCC), Cardiomyopathy, Chronic a-fib (HCC), Hyperlipidemia, Hypertension, Hypothyroidism, Ptosis of both eyelids, Type II or unspecified type diabetes mellitus without mention of complication, not stated as uncontrolled, and Unspecified sleep apnea.  Past Surgical History:  has a past surgical history that includes hernia repair; Uvulopalatopharygoplasty; Septoplasty; Cardiac surgery (2009); Carpal tunnel release (Left, 2016); Upper gastrointestinal endoscopy (N/A, 06/11/2019); Colonoscopy (N/A, 06/11/2019); Upper gastrointestinal endoscopy (N/A, 06/11/2019); and Lumbar spine surgery (Bilateral, 03/23/2021).  Discharge Recommendations: STIRLING ORTON scored a 19/24 on the AM-PAC short mobility form.  At this time, no further PT is recommended upon discharge due to proximity to baseline.  Recommend patient returns to prior setting with prior services.     DME Required For Discharge: patient has all required DME for discharge  Precautions/Restrictions: low fall risk        Positional Restrictions:Spinal Precautions - No Bending, Lifting, Twisting    Pre-Admission Information   Lives With: spouse    Who has dementia. Pt is her primary caregiver. Niece currently taking hare of pt spouse (bed/chair bound). Hospice at home for spouse who assist with bathing and laundry.   Type of Home: house  Home Layout: tri-level, 5 steps down & 8 up stairs to 2nd level with R HR  Home Access: level entry-throughout  garage  Bathroom Layout: walk in shower  Bathroom Equipment: grab bars in shower, hand held shower head  Toilet Height: elevated height  Home Equipment: rolling walker, adjustable bed   Transfer Assistance: Independent without use of device  Ambulation Assistance:Independent without use of device  ADL Assistance: independent with all ADL's  IADL Assistance: independent with homemaking tasks  Active Driver:        [x]  Yes                 []  No  Hand Dominance: []  Left                 [x]  Right  Current Employment: unemployed  Hobbies:  Riding motor cycles  Recent Falls: Denies recent falls.    Examination   Vision:   Vision Gross Assessment: Impaired and Vision Corrective Device: wears glasses for reading  Hearing:   hard of hearing--L ear is   Observation:   General Observation:  JP drain present   Posture:   Fair   Sensation:   WFL    ROM:   (B) LE AROM WFL  Strength:   (B) LE strength grossly WFL  Therapist Clinical Decision Making (Complexity): low complexity  Clinical Presentation: stable      Subjective  General: upright in bed at arrival, agreed to evaluation   Pain: 5/10.  Location: with movement in back   Pain Interventions: pain medication in place prior to arrival       Functional Mobility  Bed Mobility  Supine to Sit: stand by assistance  Comments:HOB elevated  Transfers  Sit to stand transfer: supervision  Stand to sit transfer: supervision  Car transfer: supervision  Comments: vc for hand placement and safety during transfers  Ambulation  Surface:level surface  Assistive Device: rolling walker  Assistance: stand by assistance  Distance: 250 ft   Gait Mechanics: steady cadence, no LOB   Comments:    Ambulation Trial 2  Surface:level surface  Assistive Device: no device  Assistance: stand by assistance  Distance:30 ft  Gait Mechanics: same quality as above, slightly flexed posture   Comments:      Stair Mobility  Number of Steps: 4  Step Height: 6 inch  Hand Rails: (R) ascending handrail  Assistance:  stand by assistance  Comments:  Wheelchair Mobility:  No w/c mobility completed on this date.  Comments:  Balance  Static Sitting Balance: fair (+): maintains balance at SBA/supervision without use of UE support  Dynamic Sitting Balance: fair (+): maintains balance at SBA/supervision without use of UE support  Static Standing Balance: fair: maintains balance at CGA without use of UE support  Dynamic Standing Balance: fair: maintains balance at CGA without use of UE support  Comments:    Other Therapeutic Interventions    Functional Outcomes  AM-PAC Inpatient Mobility Raw Score : 19              Cognition  WFL  Orientation:    alert and oriented x 4  Command Following:   [facility]    Education  Barriers To Learning: hearing  Patient Education: patient educated on goals, PT role and benefits, precautions, general safety, functional mobility training  Learning Assessment:  patient verbalizes and demonstrates understanding    Assessment  Activity Tolerance: tolerated well with minimal pain   Impairments Requiring Therapeutic Intervention: decreased functional mobility, decreased ADL status, decreased endurance  Prognosis: good  Clinical Assessment: Pt evaluated s/p lumbar laminectomy. Pt having minimal pain with mobility and functioning close to his baseline. Using RW for longer distances of ambulation.  Pt aware of spinal precautions and adhered to them during mobility.  Continued skilled PT to promote safe return home.   Safety Interventions: patient left in chair, chair alarm in place, call light within reach, and nurse notified    Plan  Frequency: 7 x/week  Current Treatment Recommendations: balance training, functional mobility training, transfer training, gait training, and stair training    Goals  Patient Goals: return home    Short Term Goals:  Time Frame: by discharge   Patient will complete bed mobility at modified independent   Patient will complete transfers at modified independent   Patient will ambulate 150 ft  with use of LRAD at modified independent  Patient will ascend/descend 8 stairs with (R) ascending handrail at modified independent  Patient will complete car transfer at modified independent    Therapy Session Time      Individual Group Co-treatment   Time In     0849   Time Out     0935   Minutes     46     Timed Code Treatment Minutes:  0 Minutes  Total Treatment Minutes:  46   Time split with OT due to observation status.    Electronically Signed By: Adella Hare, PT

## 2021-03-24 NOTE — Progress Notes (Addendum)
[facility] - Inpatient Rehabilitation Department   Phone: [phone]    Occupational Therapy    [x]  Initial Evaluation            []  Daily Treatment Note         []  Discharge Summary      Patient: Carl Blair   DOB: 11-09-1940   MRN:   Date of Service:  03/24/2021    Admitting Diagnosis:  Spinal stenosis of lumbar region with neurogenic claudication  Current Admission Summary:  s/p L3/4 lumbar laminectomy with synovial cyst removal  Past Medical History:  has a past medical history of Atrial fibrillation (HCC), Cardiomyopathy, Chronic a-fib (HCC), Hyperlipidemia, Hypertension, Hypothyroidism, Ptosis of both eyelids, Type II or unspecified type diabetes mellitus without mention of complication, not stated as uncontrolled, and Unspecified sleep apnea.  Past Surgical History:  has a past surgical history that includes hernia repair; Uvulopalatopharygoplasty; Septoplasty; Cardiac surgery (2009); Carpal tunnel release (Left, 2016); Upper gastrointestinal endoscopy (N/A, 06/11/2019); Colonoscopy (N/A, 06/11/2019); Upper gastrointestinal endoscopy (N/A, 06/11/2019); and Lumbar spine surgery (Bilateral, 03/23/2021).    Discharge Recommendations: EUNICE OLDAKER scored a 21/24 on the AM-PAC ADL Inpatient form.  At this time, no further OT is recommended upon discharge due to patient at independent level.  Recommend patient returns to prior setting with prior services.      DME Required For Discharge: Sock Aide, Reacher, Shower chair with back, Rolling walker    Precautions/Restrictions: low fall risk  Positional Restrictions:Spinal Precautions - No Bending, Lifting, Twisting    Pre-Admission Information   Lives With: spouse Who has dementia. Pt is her primary caregiver. Niece currently taking hare of pt spouse (bed/chair bound). Hospice at home for spouse who assist with bathing and laundry.   Type of Home: house  Home Layout: tri-level, 5 steps down & 8 up stairs to 2nd level with R HR  Home Access:  level entry-throughout garage  Bathroom Layout: walk in shower  Bathroom Equipment: grab bars in shower, hand held shower head  Toilet Height: elevated height  Home Equipment: rolling walker  Transfer Assistance: Independent without use of device  Ambulation Assistance:Independent without use of device  ADL Assistance: independent with all ADL's  IADL Assistance: independent with homemaking tasks  Active Driver:        [x]  Yes  []  No  Hand Dominance: []  Left  [x]  Right  Current Employment: unemployed  Hobbies:  Riding motor cycles  Recent Falls: Denies recent falls.     Examination   Vision:   Vision Gross Assessment: Impaired and Vision Corrective Device: wears glasses for reading  Hearing:   hard of hearing--L ear is   Observation:   General Observation:  JP drain present   Posture:   Fair   Sensation:   WFL  ROM:   (B) UE ROM WFL  Strength:   (B) UE gross strength WFL    Therapist Clinical Decision Making (Complexity): low complexity  Clinical Presentation: stable      Subjective  General: pt supine in bed upon entry. He is pleasant and agreeable to PT/OT evaluations.   Pain: 0/10 and 5/10.  Location: back with movement.   Pain Interventions: RN notified and repositioned         Activities of Daily Living  Basic Activities of Daily Living  Grooming: stand by assistance Comment: completed brushing teeth in stance at sink.   Lower Extremity Dressing: moderate assistance maximum assistance Comment: Max A to initially thread socks/shoes  sitting EOB--reinforced spinal precautions. Pt re-attempted threading socks/shoes with Min A with education/instruction in LB adaptive equipment (reacher, sock aide)--Educated pt on how/ where to find.   Dressing Equipment: reacher, sock aide  Comment: pt able to reverse demonstrate LB adaptive equipment with supervision  Instrumental Activities of Daily Living  No IADL completed on this date.    Functional Mobility  Bed Mobility  Supine to Sit: stand by assistance  Scooting:  supervision  Comments:  Transfers  Sit to stand transfer:supervision  Stand to sit transfer: supervision  Car transfer: stand by assistance, cues for hand placement  Comments: minimal cues for hand placement. Pt with good carryover.   Functional Mobility:  Sitting Balance: supervision.    Standing Balance: supervision.    Functional Mobility: .  stand by assistance, Pt ambulated 257ft with RW SBA. See PT notes for details regarding gait quality.    Other Therapeutic Interventions    Functional Outcomes  AM-PAC Inpatient Daily Activity Raw Score: 21    Cognition  WFL  Orientation:    A&O x 4  Command Following:   [facility]     Education  Barriers To Learning: none  Patient Education: Patient educated on goals, OT role and benefits, discharge recommendations  Learning Assessment:  Patient verbalized and demonstrates understanding--may benefit from continued reinforcement.    Assessment  Impairments Requiring Therapeutic Intervention: none - eval with same day discharge  Prognosis: good without need for therapy intervention  Clinical Assessment: Pt presenting near functional baseline with the above deficits associated with spinal stenosis with neurogenic claudication. Pt with BILATERAL LUMBAR3-LUMBAR4 DECOMPRESSIVE LAMINECTOMY WITH EXCISION OF SYNOVIAL CYST on 12/13. Continued OT indicated in order to facilitate safe and successful return to PLOF.     Safety Interventions: patient left in chair, chair alarm in place, call light within reach, and nurse notified    Plan  Frequency: 7x.    Current Treatment Recommendations: AE education, safety, balance deficits.   Goals    Patient Goals: Return to home     Short Term Goals:  To be completed in: by discharge  Patient will complete upper body ADL at modified independent   Patient will complete lower body ADL at modified independent   Patient will complete toileting at modified independent   Patient will complete grooming at modified independent   Patient will complete functional  transfers at modified independent     Therapy Session Time     Individual Group Co-treatment   Time In 0848      Time Out 0935      Minutes 47            Timed Code Treatment Minutes: 32 Minutes  Total Treatment Minutes:  47 minuets       Electronically Signed By: Calvert Cantor, OT  Calvert Cantor OTR/L  [phone]

## 2021-03-24 NOTE — Care Coordination-Inpatient (Signed)
Discharge Planning Note:    Chart reviewed and it appears that patient has minimal needs for discharge at this time. Discussed with patient???s nurse and requested that case management be notified if discharge needs are identified.     - Current discharge plan is for the patient to return home.    Case management will continue to follow progress and update discharge plan as needed.      Risk of Readmission Score: Observation     Loleta Chance, BSN RN  Case Manager  [facility]  Phone: [phone]

## 2021-03-24 NOTE — Progress Notes (Signed)
Discharge instructions given, all questions answered. IV removed with tip intact. Medications filled at [facility] pharmacy patient aware and side effects reviewed with patient, nurse wheeled patient to car.  Ander Slade, RN

## 2021-03-24 NOTE — Plan of Care (Signed)
Problem: Chronic Conditions and Co-morbidities  Goal: Patient's chronic conditions and co-morbidity symptoms are monitored and maintained or improved  Outcome: Progressing     Problem: Discharge Planning  Goal: Discharge to home or other facility with appropriate resources  Outcome: Progressing     Problem: Pain  Goal: Verbalizes/displays adequate comfort level or baseline comfort level  Outcome: Progressing

## 2021-03-24 NOTE — Progress Notes (Signed)
Shift assessment completed, morning medication given per MAR. VSS, alert and oriented. Call light within reach. The care plan and education has been reviewed and mutually agreed upon with the patient.

## 2021-03-24 NOTE — Plan of Care (Signed)
Problem: Chronic Conditions and Co-morbidities  Goal: Patient's chronic conditions and co-morbidity symptoms are monitored and maintained or improved  03/24/2021 0839 by Lawana Pai, RN  Outcome: Progressing     Problem: Discharge Planning  Goal: Discharge to home or other facility with appropriate resources  03/24/2021 0839 by Lawana Pai, RN  Outcome: Progressing     Problem: Pain  Goal: Verbalizes/displays adequate comfort level or baseline comfort level  03/24/2021 0839 by Lawana Pai, RN  Outcome: Progressing

## 2021-03-24 NOTE — Progress Notes (Signed)
Shift assessment complete, patient is alert and oriented X4, VSS, up in chair for few hours, back in bed. Dressing to lower back CDI, no drainage noted, JP drain in place draining bloody output. Voiding, SCD in place. Patient is calm and cooperative. Safety precautions in place.   The care plan and education has been reviewed and mutually agreed upon with the patient.

## 2021-03-25 NOTE — Telephone Encounter (Signed)
Care Transitions Initial Follow Up Call    Outreach made within 2 business days of discharge: Yes    Patient: Carl Blair Patient DOB: 1941-02-26   MRN: [id number]  Reason for Admission: There are no discharge diagnoses documented for the most recent discharge.  Discharge Date: 03/24/21       Spoke with: left message     Discharge department/facility: [facility]     TCM Interactive Patient Contact:  Was patient able to fill all prescriptions: Yes  Was patient instructed to bring all medications to the follow-up visit: Yes  Is patient taking all medications as directed in the discharge summary? Yes  Does patient understand their discharge instructions: Yes  Does patient have questions or concerns that need addressed prior to 7-14 day follow up office visit: yes -     Scheduled appointment with PCP within 7-14 days    Follow Up with specialist   No future appointments.    Analucia Hush

## 2021-03-27 ENCOUNTER — Emergency Department: Admit: 2021-03-27 | Payer: Medicare (Managed Care)

## 2021-03-27 ENCOUNTER — Observation Stay
Admission: EM | Admit: 2021-03-27 | Discharge: 2021-03-31 | Disposition: A | Discharge status: Skilled Nursing Facility | Payer: Medicare (Managed Care)

## 2021-03-27 DIAGNOSIS — U071 COVID-19: Secondary | ICD-10-CM

## 2021-03-27 LAB — INFLUENZA A AND B, COVID, RSV COMBINATION ASSAY, NAA
Influenza A: NEGATIVE
Influenza B: NEGATIVE
RSV: NEGATIVE
SARS-CoV-2: POSITIVE — CR

## 2021-03-27 LAB — VENOUS BLOOD GAS, LINE/SYRINGE
%HBO2-Line Draw: 35.9 % (ref 40.0–70.0)
Base Excess-Line Draw: 4.6 mmol/L (ref <=3.0)
CO2 Content-Line Draw: 32 mmol/L (ref 25–29)
Carboxyhgb-Line Draw: 1.6 %
HCO3-Line Draw: 31 mmol/L (ref 24–28)
Methemoglobin-Line Draw: 0.8 % (ref 0.0–1.5)
PCO2-Line Draw: 52 mm Hg (ref 41–51)
PH-Line Draw: 7.38 (ref 7.32–7.42)
PO2-Line Draw: 22 mm Hg (ref 25–40)
Reduced Hemoglobin-Line Draw: 61.7 % (ref 0.0–5.0)

## 2021-03-27 LAB — BASIC METABOLIC PANEL
Anion Gap: 7 mmol/L (ref 3–16)
BUN: 22 mg/dL (ref 7–25)
CO2: 28 mmol/L (ref 21–33)
Calcium: 9 mg/dL (ref 8.6–10.3)
Chloride: 97 mmol/L (ref 98–110)
Creatinine: 1.13 mg/dL (ref 0.60–1.30)
EGFR: 66
Glucose: 247 mg/dL (ref 70–100)
Osmolality, Calculated: 286 mOsm/kg (ref 278–305)
Potassium: 4.9 mmol/L (ref 3.5–5.3)
Sodium: 132 mmol/L (ref 133–146)

## 2021-03-27 LAB — CBC
Hematocrit: 34.9 % — ABNORMAL LOW (ref 38.5–50.0)
Hemoglobin: 11.4 g/dL — ABNORMAL LOW (ref 13.2–17.1)
MCH: 27.2 pg (ref 27.0–33.0)
MCHC: 32.7 g/dL (ref 32.0–36.0)
MCV: 83.3 fL (ref 80.0–100.0)
MPV: 6.9 fL — ABNORMAL LOW (ref 7.5–11.5)
Platelets: 186 10E3/uL (ref 140–400)
RBC: 4.19 10E6/uL — ABNORMAL LOW (ref 4.20–5.80)
RDW: 15.6 % — ABNORMAL HIGH (ref 11.0–15.0)
WBC: 15.1 10E3/uL — ABNORMAL HIGH (ref 3.8–10.8)

## 2021-03-27 LAB — URINALYSIS W/RFL TO MICROSCOPIC
Bilirubin, UA: NEGATIVE
Glucose, UA: >=1000 mg/dL — AB
Ketones, UA: NEGATIVE mg/dL
Leukocyte Esterase, UA: NEGATIVE
Nitrite, UA: NEGATIVE
Protein, UA: 100 mg/dL — AB
RBC, UA: 2 /HPF (ref 0–3)
Specific Gravity, UA: >=1.03 (ref 1.005–1.035)
Urobilinogen, UA: 2 EU/dL (ref 0.2–1.0)
pH, UA: 5.5 (ref 5.0–8.0)

## 2021-03-27 LAB — DIFFERENTIAL
Basophils Absolute: 60 /uL (ref 0–200)
Basophils Relative: 0.4 % (ref 0.0–1.0)
Eosinophils Absolute: 227 /uL (ref 15–500)
Eosinophils Relative: 1.5 % (ref 0.0–8.0)
Lymphocytes Absolute: 2582 /uL (ref 850–3900)
Lymphocytes Relative: 17.1 % (ref 15.0–45.0)
Monocytes Absolute: 1480 /uL — ABNORMAL HIGH (ref 200–950)
Monocytes Relative: 9.8 % (ref 0.0–12.0)
Neutrophils Absolute: 10751 /uL — ABNORMAL HIGH (ref 1500–7800)
Neutrophils Relative: 71.2 % (ref 40.0–80.0)
nRBC: 0 /100{WBCs} (ref 0–0)

## 2021-03-27 LAB — HIGH SENSITIVITY TROPONIN: High Sensitivity Troponin: 12 ng/L (ref 0–20)

## 2021-03-27 LAB — B NATRIURETIC PEPTIDE: BNP: 96 pg/mL (ref 0–100)

## 2021-03-27 LAB — LACTIC ACID: Lactate: 0.9 mmol/L (ref 0.5–2.2)

## 2021-03-27 MED ORDER — aspirin EC tablet 81 mg
81 | Freq: Every day | ORAL | Status: AC
Start: 2021-03-27 — End: 2021-03-27

## 2021-03-27 MED ORDER — remdesivir (VEKLURY) 100 mg in sodium chloride 0.9 % 250 mL IVPB
INTRAVENOUS | Status: AC
Start: 2021-03-27 — End: 2021-03-30
  Administered 2021-03-29 – 2021-03-30 (×2): 100 mg via INTRAVENOUS

## 2021-03-27 MED ORDER — tamsulosin (FLOMAX) capsule 0.4 mg
0.4 | Freq: Every evening | ORAL | Status: AC
Start: 2021-03-27 — End: 2021-03-31
  Administered 2021-03-28 – 2021-03-31 (×4): 0.4 mg via ORAL

## 2021-03-27 MED ORDER — zinc sulfate (ZINCATE) capsule 220 mg
50 | Freq: Every day | ORAL | Status: AC
Start: 2021-03-27 — End: 2021-03-31
  Administered 2021-03-28 – 2021-03-31 (×4): 220 mg via ORAL

## 2021-03-27 MED ORDER — insulin lispro (humaLOG/ADMELOG) injection 0-10 Units
100 | Freq: Every evening | SUBCUTANEOUS | Status: AC
Start: 2021-03-27 — End: 2021-03-31
  Administered 2021-03-28: 04:00:00 2 [IU] via SUBCUTANEOUS
  Administered 2021-03-29: 03:00:00 5 [IU] via SUBCUTANEOUS
  Administered 2021-03-30: 01:00:00 2 [IU] via SUBCUTANEOUS

## 2021-03-27 MED ORDER — ascorbic acid (vitamin C) (VITAMIN C) tablet 1,000 mg
500 | Freq: Two times a day (BID) | ORAL | Status: AC
Start: 2021-03-27 — End: 2021-03-31
  Administered 2021-03-28 – 2021-03-31 (×8): 1000 mg via ORAL

## 2021-03-27 MED ORDER — carvediloL (COREG) tablet 25 mg
25 | Freq: Two times a day (BID) | ORAL | Status: AC
Start: 2021-03-27 — End: 2021-03-31
  Administered 2021-03-28 – 2021-03-31 (×8): 25 mg via ORAL

## 2021-03-27 MED ORDER — propafenone (RYTHMOL) immediate release tablet 225 mg
150 | Freq: Three times a day (TID) | ORAL | Status: AC
Start: 2021-03-27 — End: 2021-03-31
  Administered 2021-03-28 – 2021-03-31 (×12): 225 mg via ORAL

## 2021-03-27 MED ORDER — ondansetron (ZOFRAN) injection 4 mg
4 | Freq: Three times a day (TID) | INTRAMUSCULAR | Status: AC | PRN
Start: 2021-03-27 — End: 2021-03-31

## 2021-03-27 MED ORDER — dextrose 50 % in water (D50W) iv Syrg 25 mL
INTRAVENOUS | Status: AC | PRN
Start: 2021-03-27 — End: 2021-03-31

## 2021-03-27 MED ORDER — apixaban (ELIQUIS) tablet 5 mg
5 | Freq: Two times a day (BID) | ORAL | Status: AC
Start: 2021-03-27 — End: 2021-03-27

## 2021-03-27 MED ORDER — remdesivir (VEKLURY) 200 mg in sodium chloride 0.9 % 250 mL IVPB
Freq: Once | INTRAVENOUS | Status: AC
Start: 2021-03-27 — End: 2021-03-28
  Administered 2021-03-28: 07:00:00 200 mg via INTRAVENOUS

## 2021-03-27 MED ORDER — dextrose 50 % in water (D50W) iv Syrg 50 mL
INTRAVENOUS | Status: AC | PRN
Start: 2021-03-27 — End: 2021-03-31

## 2021-03-27 MED ORDER — pantoprazole (PROTONIX) EC tablet 40 mg
40 | Freq: Every day | ORAL | Status: AC
Start: 2021-03-27 — End: 2021-03-31
  Administered 2021-03-28 – 2021-03-31 (×4): 40 mg via ORAL

## 2021-03-27 MED ORDER — OMNIPAQUE (iohexol) 350 mg iodine/mL 150 mL
350 | Freq: Once | INTRAVENOUS | Status: AC | PRN
Start: 2021-03-27 — End: 2021-03-27
  Administered 2021-03-27: 21:00:00 150 mL via INTRAVENOUS

## 2021-03-27 MED ORDER — glucose chewable tablet 12 g
4 | ORAL | Status: AC | PRN
Start: 2021-03-27 — End: 2021-03-31

## 2021-03-27 MED ORDER — acetaminophen (TYLENOL) tablet 650 mg
325 | ORAL | Status: AC | PRN
Start: 2021-03-27 — End: 2021-03-31
  Administered 2021-03-28 – 2021-03-29 (×2): 650 mg via ORAL

## 2021-03-27 MED ORDER — sodium chloride 0.9 % infusion
INTRAVENOUS | Status: AC
Start: 2021-03-27 — End: 2021-03-27
  Administered 2021-03-27: 21:00:00 50

## 2021-03-27 MED ORDER — levothyroxine (SYNTHROID) tablet 100 mcg
100 | Freq: Every morning | ORAL | Status: AC
Start: 2021-03-27 — End: 2021-03-31
  Administered 2021-03-28 – 2021-03-31 (×4): 100 ug via ORAL

## 2021-03-27 MED ORDER — apixaban (ELIQUIS) tablet 5 mg
5 | Freq: Two times a day (BID) | ORAL | Status: AC
Start: 2021-03-27 — End: 2021-03-31
  Administered 2021-03-29 – 2021-03-31 (×5): 5 mg via ORAL

## 2021-03-27 MED ORDER — valsartan (DIOVAN) tablet 320 mg
160 | Freq: Every day | ORAL | Status: AC
Start: 2021-03-27 — End: 2021-03-31
  Administered 2021-03-28 – 2021-03-31 (×4): 320 mg via ORAL

## 2021-03-27 MED ORDER — insulin lispro (humaLOG/ADMELOG) injection 0-12 Units
100 | Freq: Three times a day (TID) | SUBCUTANEOUS | Status: AC
Start: 2021-03-27 — End: 2021-03-31
  Administered 2021-03-28: 23:00:00 7 [IU] via SUBCUTANEOUS
  Administered 2021-03-28: 14:00:00 4 [IU] via SUBCUTANEOUS
  Administered 2021-03-28: 18:00:00 10 [IU] via SUBCUTANEOUS
  Administered 2021-03-29: 23:00:00 7 [IU] via SUBCUTANEOUS
  Administered 2021-03-29: 16:00:00 4 [IU] via SUBCUTANEOUS
  Administered 2021-03-30 (×2): 7 [IU] via SUBCUTANEOUS
  Administered 2021-03-30 – 2021-03-31 (×2): 2 [IU] via SUBCUTANEOUS
  Administered 2021-03-31: 17:00:00 7 [IU] via SUBCUTANEOUS

## 2021-03-27 MED ORDER — aspirin EC tablet 81 mg
81 | Freq: Every day | ORAL | Status: AC
Start: 2021-03-27 — End: 2021-03-31
  Administered 2021-03-29 – 2021-03-31 (×3): 81 mg via ORAL

## 2021-03-27 MED ORDER — atorvastatin (LIPITOR) tablet 10 mg
10 | Freq: Every evening | ORAL | Status: AC
Start: 2021-03-27 — End: 2021-03-31
  Administered 2021-03-28 – 2021-03-31 (×4): 10 mg via ORAL

## 2021-03-27 MED ORDER — sodium chloride 0.9 % infusion
INTRAVENOUS | Status: AC
Start: 2021-03-27 — End: 2021-03-29
  Administered 2021-03-28 – 2021-03-29 (×5): 75 mL/h via INTRAVENOUS

## 2021-03-27 MED ORDER — ferrous sulfate tablet 325 mg
325 | Freq: Every day | ORAL | Status: AC
Start: 2021-03-27 — End: 2021-03-31
  Administered 2021-03-28 – 2021-03-31 (×4): 325 mg via ORAL

## 2021-03-27 MED ORDER — sodium chloride 0.9% flush 30 mL
INTRAMUSCULAR | Status: AC | PRN
Start: 2021-03-27 — End: 2021-03-30

## 2021-03-27 MED FILL — TAMSULOSIN 0.4 MG CAPSULE: 0.4 0.4 mg | ORAL | Qty: 1

## 2021-03-27 MED FILL — VITAMIN C 500 MG TABLET: 500 500 MG | ORAL | Qty: 2

## 2021-03-27 MED FILL — HUMALOG U-100 INSULIN 100 UNIT/ML SUBCUTANEOUS SOLUTION: 100 100 unit/mL | SUBCUTANEOUS | Qty: 3

## 2021-03-27 MED FILL — SODIUM CHLORIDE 0.9 % INTRAVENOUS SOLUTION: INTRAVENOUS | Qty: 50

## 2021-03-27 MED FILL — PROPAFENONE 225 MG TABLET: 225 225 MG | ORAL | Qty: 1

## 2021-03-27 MED FILL — SODIUM CHLORIDE 0.9 % INTRAVENOUS SOLUTION: 75.00 75.00 mL/hr | INTRAVENOUS | Qty: 1000

## 2021-03-27 MED FILL — ATORVASTATIN 10 MG TABLET: 10 10 MG | ORAL | Qty: 1

## 2021-03-27 MED FILL — CARVEDILOL 25 MG TABLET: 25 25 MG | ORAL | Qty: 1

## 2021-03-27 NOTE — Unmapped (Signed)
RN received bedside report from [facility] RN.

## 2021-03-27 NOTE — Unmapped (Signed)
Patient is incontinent of urine x 2.  RN performed peri-care and full linen change. At this time patient is refusing to wear a gown.

## 2021-03-27 NOTE — Unmapped (Signed)
Problem: Glucose Imbalance related to diabetes disease process  Goal: Clinical indication of glucose balance is achieved  Outcome: Progressing  Intervention: Assess for symptoms of hyper/hypoglycemia  Note:    Intervention: Monitor blood glucose levels as ordered (as needed)  Note:    Intervention: Administer medications as ordered (as needed)  Note:    Intervention: Notify physician of ineffective treatment plan (as needed)  Note:

## 2021-03-27 NOTE — Unmapped (Addendum)
[employer] [facility] [employer]  History and Physical    Admit Date:   03/27/2021    Patient's PCP:  Marcello Fennel, MD  DOB:     Aug 21, 1940  Patient Class:  Observation    CHIEF COMPLAINT     Chief Complaint   Patient presents with   ??? Weakness       HISTORY OF PRESENT ILLNESS   [name] is a 80 y.o. male w/ past medical history of afib s/p mini MAZE procedure and anticoagulated with eliquis, dilated cardiomyopathy, diabetes, OSA, who is here with acute covid 19 infection.  He was at home earlier today and says he suddenly developed severe weakness where his legs felt like they were going to give out on him around 11:00 am.  He then came to the ER and tested positive for covid 19.   He was recently treated for synovial cyst of the lumbar spine, spinal stenosis with neurogenic claudication with surgery decompressive laminectomy with excision of synovial cyst on 12/13 by Dr. Zola Button.  He was doing well at home until earlier today when he developed the leg weakness.  He denies having cough, fever, headache, nausea, vomiting, at home prior to today.      He was incidentally told by his surgeon that he could restart the aspirin and eliquis on Monday 03/29/21.      PAST MEDICAL HISTORY     Past Medical History:   Diagnosis Date   ??? Atrial fibrillation (CMS Dx)     corrected with Mini-maze procedure   ??? Cataract     left eye   ??? Diabetes mellitus (CMS Dx)     type 2   ??? Dilated cardiomyopathy (CMS Dx)    ??? Hearing loss     left ear   ??? Hypercholesteremia    ??? Hypertension    ??? Sleep apnea    ??? Thyroid disease     hypothyroidism       PAST SURGICAL HISTORY     Past Surgical History:   Procedure Laterality Date   ??? COLONOSCOPY W/ POLYPECTOMY      benign  has at 3 plus procedures   ??? HERNIA REPAIR  9/09    right inguinal, mini maze   ??? HERNIA REPAIR  2/10    herniated lungs bilateral (repaired with mesh)   ??? mini-maze      corrected his A-fib   ??? rt eye  02/2018   ??? SEPTOPLASTY     ??? UVULOPALATOPHARYNGOPLASTY      ??? UVULOPALATOPHARYNGOPLASTY      has had esophageal dilations in the past (Dr.Decter)   ??? WISDOM TOOTH EXTRACTION         MEDICATIONS   (Not in a hospital admission)        Current Facility-Administered Medications   Medication Dose Frequency Provider Last Admin   ??? acetaminophen  650 mg Q4H PRN Brian Clymer, DO     ??? apixaban  5 mg BID Brian Clymer, DO     ??? ascorbic acid (vitamin C)  1,000 mg BID Brian Clymer, DO     ??? [START ON 03/28/2021] aspirin  81 mg Daily 0900 Brian Clymer, DO     ??? atorvastatin  10 mg Nightly (2100) Arlys John Clymer, DO     ??? carvediloL  25 mg BID Brian Clymer, DO     ??? dextrose 50 % in water (D50W)  25 mL Q15 Min PRN [employer], DO  Or   ??? dextrose 50 % in water (D50W)  50 mL Q15 Min PRN Arlys John Clymer, DO     ??? [START ON 03/28/2021] ferrous sulfate  325 mg Daily with breakfast Arlys John Clymer, DO     ??? glucose  12 g Q15 Min PRN [employer], DO     ??? insulin lispro  0-10 Units Nightly (2100) Arlys John Clymer, DO     ??? insulin lispro  0-12 Units TID AC Brian Clymer, DO     ??? [START ON 03/28/2021] levothyroxine  100 mcg QAM Brian Clymer, DO     ??? ondansetron  4 mg Q8H PRN Brian Clymer, DO     ??? [START ON 03/28/2021] pantoprazole  40 mg DAILY 0600 Brian Clymer, DO     ??? propafenone  225 mg TID Arlys John Clymer, DO     ??? sodium chloride 0.9 %  75 mL/hr Continuous Brian Clymer, DO     ??? tamsulosin  0.4 mg Nightly (2100) Arlys John Clymer, DO     ??? [START ON 03/28/2021] zinc sulfate  220 mg Daily 0900 Brian Clymer, DO       Current Outpatient Medications   Medication Sig   ??? apixaban Take 5 mg by mouth 2 times a day.   ??? aspirin Take 81 mg by mouth daily.   ??? atorvastatin Take 10 mg by mouth at bedtime.    ??? carvediloL Take 25 mg by mouth 2 (two) times daily.     ??? ferrous sulfate Take 325 mg by mouth daily with breakfast.   ??? furosemide Take 1 tablet (20 mg total) by mouth daily.   ??? glimepiride Take 1 tablet by mouth  every morning before  breakfast   ??? GLUC/CHON-MSM#1/C/MANG/BOS/BOR (OSTEO BI-FLEX ORAL)  Take by mouth. OSTEO BI-FLEX JOINT SHIELD TABS    ??? glyBURIDE-metformin Take 2 tablets by mouth 2 times a day.    ??? levothyroxine Take 100 mcg by mouth every morning.    ??? omeprazole Take 40 mg by mouth every morning.    ??? propafenone Take 225 mg by mouth 3 times a day.   ??? tamsulosin Take 0.4 mg by mouth at bedtime.   ??? valsartan Take 320 mg by mouth daily.       ALLERGIES   Patient has no known allergies.    SOCIAL HISTORY   TOBACCO:  reports that he has never smoked. He has never used smokeless tobacco.       ETOH:   reports no history of alcohol use.  DRUG:    Social History     Substance and Sexual Activity   Drug Use No    Comment: 04-14-11       FAMILY HISTORY     Family History   Problem Relation Age of Onset   ??? Diabetes Mother         type 2   ??? Hypertension Mother    ??? Coronary artery disease Sister    ??? Diabetes Sister         type 2   ??? Coronary artery disease Sister    ??? Coronary artery disease Brother    ??? Hypertension Brother    ??? Diabetes Brother         type 2   ??? Hypertension Brother          REVIEW OF SYSTEMS     General: weakness and fatigue at home, fevers since here.  ENT: no sore throat or nasal congestion  Cardiac: No chest pain or palpitations.  Pulm: No shortness of breath or wheezing.  Did have a little bit of a cough  GI: No abdominal pain, N/V.  Musculoskeletal: No leg edema or muscle pain.    Neuro: No dizziness/lightheadedness or HA. Did have bilateral leg weakness  GU: no dysuria or hematuria  Derm: no new rash, no skin lesions  Psych: mood is stable        PHYSICAL EXAM   BP (!) 161/91    Pulse 94    Temp (!) 101.6 ??F (38.7 ??C) (Oral)    Resp (!) 39    Wt (!) 264 lb 11.2 oz (120.1 kg)    SpO2 94%    BMI 35.90 kg/m??  O2 Flow Rate (L/min): 2 L/min    General Appearance:  Alert, cooperative, appears younger than stated age.   Head:  Normocephalic, atraumatic/without obvious abnormality    Eyes:  Pupils equal, sclera nonicteric   Throat:  Moist mucus membranes    Neck:  Appears normal,  trachea midline   Lungs:  Clear to auscultation bilaterally, respirations unlabored    Heart:  Regular rate and rhythm, S1 and S2 normal   Abdomen:  Soft, non-tender, bowel sounds active all four quadrants   Extremities:  Extremities normal, no edema   Pulses:  2+ and symmetric    Skin:  Warm and dry   Neurologic:  Face symmetric, speech fluent, no tremor       LABS     Results for orders placed or performed during the hospital encounter of 03/27/21   Basic metabolic panel   Result Value Ref Range    Sodium 132 (L) 133 - 146 mmol/L    Potassium 4.9 3.5 - 5.3 mmol/L    Chloride 97 (L) 98 - 110 mmol/L    CO2 28 21 - 33 mmol/L    Anion Gap 7 3 - 16 mmol/L    BUN 22 7 - 25 mg/dL    Creatinine 8.46 9.62 - 1.30 mg/dL    Glucose 952 (H) 70 - 100 mg/dL    Calcium 9.0 8.6 - 84.1 mg/dL    Osmolality, Calculated 286 278 - 305 mOsm/kg    EGFR 66    B Natriuretic Peptide   Result Value Ref Range    BNP 96 0 - 100 pg/mL   CBC   Result Value Ref Range    WBC 15.1 (H) 3.8 - 10.8 10E3/uL    RBC 4.19 (L) 4.20 - 5.80 10E6/uL    Hemoglobin 11.4 (L) 13.2 - 17.1 g/dL    Hematocrit 32.4 (L) 38.5 - 50.0 %    MCV 83.3 80.0 - 100.0 fL    MCH 27.2 27.0 - 33.0 pg    MCHC 32.7 32.0 - 36.0 g/dL    RDW 40.1 (H) 02.7 - 15.0 %    Platelets 186 140 - 400 10E3/uL    MPV 6.9 (L) 7.5 - 11.5 fL   Differential   Result Value Ref Range    Neutrophils Relative 71.2 40.0 - 80.0 %    Lymphocytes Relative 17.1 15.0 - 45.0 %    Monocytes Relative 9.8 0.0 - 12.0 %    Eosinophils Relative 1.5 0.0 - 8.0 %    Basophils Relative 0.4 0.0 - 1.0 %    nRBC 0 0 - 0 /100 WBC    Neutrophils Absolute 10,751 (H) 1,500 - 7,800 /uL    Lymphocytes Absolute 2,582 850 - 3,900 /uL  Monocytes Absolute 1,480 (H) 200 - 950 /uL    Eosinophils Absolute 227 15 - 500 /uL    Basophils Absolute 60 0 - 200 /uL   Lactic Acid   Result Value Ref Range    Lactate 0.9 0.5 - 2.2 mmol/L   High Sensitivity Troponin   Result Value Ref Range    High Sensitivity Troponin 12 0 - 20 ng/L   Venous  blood gas   Result Value Ref Range    PH-Line Draw 7.38 7.32 - 7.42    PCO2-Line Draw 52 (H) 41 - 51 mm Hg    PO2-Line Draw 22 (L) 25 - 40 mm Hg    HCO3-Line Draw 31 (H) 24 - 28 mmol/L    CO2 Content-Line Draw 32 (H) 25 - 29 mmol/L    Base Excess-Line Draw 4.6 (H) -2.0 - 3.0 mmol/L    %HBO2-Line Draw 35.9 (L) 40.0 - 70.0 %    Carboxyhgb-Line Draw 1.6 %    Methemoglobin-Line Draw 0.8 0.0 - 1.5 %    Reduced Hemoglobin-Line Draw 61.7 (H) 0.0 - 5.0 %   Urinalysis w/Rfl to Microscopic   Result Value Ref Range    Color, UA Yellow Straw,Yellow    Clarity, UA Clear Clear    Specific Gravity, UA >=1.030 1.005 - 1.035    pH, UA 5.5 5.0 - 8.0    Protein, UA 100 mg/dL (A) Negative mg/dL    Glucose, UA >=1610 mg/dL (A) Negative mg/dL    Ketones, UA Negative Negative mg/dL    Bilirubin, UA Negative Negative    Blood, UA Trace-lysed (A) Negative    Nitrite, UA Negative Negative    Urobilinogen, UA 2.0 E.U./dL 0.2 - 1.0 EU/dL    Leukocytes, UA Negative Negative    RBC, UA 2 0 - 3 /HPF   Influenza A and B, COVID, RSV Combination Assay, NAA   Result Value Ref Range    Influenza A Negative Negative,Not Detected    Influenza B Negative Negative,Not Detected    RSV Negative Negative    SARS-CoV-2 Positive (AA) Not Detected,Negative    Test LOINC ordered [phone]     Device identifier Cepheid_Xpert Xpress SARS-CoV-2/Flu/RSV (Lab)_EUA     First Test No     Healthcare employee No     Symptomatic Unknown     Hospitalized Yes     In ICU Unknown     Congregate Care Resident No     Pregnant No          RADIOLOGY   CT Head WO contrast    Result Date: 03/27/2021  EXAM: CT HEAD WO CONTRAST INDICATION: Head trauma, minor (Age >= 65y), Mental status change, unknown cause TECHNIQUE: Axial thin section CT images of the head were obtained without contrast. Sagittal and coronal 2-D multiplanar reconstructions were performed at the scanner. COMPARISON: None available. FINDINGS: Moderate limitation by motion artifacts, with significant diagnostic  information still obtained. Brain parenchyma: Mild periventricular hypodensity, likely related to small vessel disease. No evidence of intracranial mass lesion or hemorrhage. Ventricles and extraaxial spaces: Proportionate enlargement of ventricles and sulci. No extra-axial fluid collection. Orbits, paranasal sinuses, mastoids: No acute orbital abnormality. Clear paranasal sinuses. Clear mastoid air cells. Extracranial soft tissues: Normal. Calvarium and skull base: No fracture or suspicious osseous lesion. Other: Atherosclerotic vascular calcifications.     IMPRESSION: 1.  Limited study due to patient motion. No acute intracranial abnormality. 2.  No intracranial mass effect or hemorrhage. Report Verified by: Drake Leach, MD  at 03/27/2021 3:57 PM EST    CT Abdomen and Pelvis With IV contrast    Result Date: 03/27/2021  EXAM: CTA CHEST WITH IV CONTRAST EXAM: CT ABDOMEN AND PELVIS WITH IV CONTRAST INDICATION: Sepsis and cough, concern for pulmonary embolism, assess for infectious source COMPARISON: Same day chest radiograph. TECHNIQUE: Multidetector CT imaging was performed through the lungs in the supine position following the administration of intravenous contrast according to the CTPA protocol. Thin section CT images were obtained through the abdomen and pelvis with coronal and sagittal multiplanar reconstructions at the scanner. Additional maximum intensity projection images were performed.  CONTRAST: 150 mL of IOHEXOL 350 MG IODINE/ML INTRAVENOUS SOLUTION administered intravenously (accession [id number]), 150 mL of IOHEXOL 350 MG IODINE/ML INTRAVENOUS SOLUTION administered intravenously [employer] [id number]) FINDINGS: Study quality: Adequate Artifacts: Motion artifact, mild streak artifact CHEST: Medical devices: None. Airways & lungs: The central airways are patent. Bandlike opacities are demonstrated within the anterior, bilateral mid upper lobes, likely atelectasis and/or chronic scarring. Otherwise,  the lungs are clear with minimal, dependent atelectasis demonstrated at the bilateral lung bases. Pleura: No pleural effusions or pneumothorax. Mild pleural thickening along the anterolateral left upper lobe at the level of the fourth intercostal space is nonspecific with posttraumatic or postsurgical sequela favored as there is minimal adjacent herniation of lung parenchyma. Lower Neck: Unremarkable. Pulmonary Embolism: None identified within the limitations discussed above. Heart: The heart is normal in size. Moderate coronary artery calcifications. No significant pericardial effusion. Atherosclerosis along the aortic root without definite valvular calcification. Additional vascular structures: Borderline ectasia of the ascending aorta up to 4 cm. Enlargement of the main pulmonary artery, measuring 4 cm in diameter. Mild atherosclerotic calcifications of the aortic arch and origins of the great vessels. Usual 3-vessel branch configuration of a left aortic arch. Mediastinum and hila: No suspicious adenopathy. Borderline right paratracheal and right hilar lymph nodes are nonspecific. Small hiatal hernia. Chest wall and axilla: Focal nodular thickening within the anterolateral left chest wall within the fourth intercostal space with adjacent linear stranding, suggestive of scarring (posterior medical or postsurgical). ABDOMEN AND PELVIS: Liver: Liver is normal in size and contour. Small hypoattenuating lesions within the left hepatic lobe are too small to characterize, but likely small cysts and/or hemangiomas. Biliary tree: Unremarkable. Spleen: Unremarkable. Pancreas: 1.4 x 1.3 hypoattenuating lesion within the pancreatic uncinate process (series 305, image 50). Otherwise, pancreas is normal in caliber. Adrenal glands: Unremarkable. Kidneys/ureters/bladder: Symmetric renal enhancement. Mild, bilateral renal cortical atrophy. Tiny 2 mm nonobstructing calculus at the upper pole of the right kidney. Small  subcentimeter renal cortical cyst located at the upper pole the left kidney. No hydronephrosis. The ureters are normal without hydroureter. Urinary bladder is normal. Gastrointestinal tract: Unremarkable. The appendix is normal. Lymphatics: Unremarkable. Vasculature: Mild, scattered calcified atherosclerosis of the abdominal aorta and branching vessels without aneurysmal dilatation. Peritoneum/Retroperitoneum: Unremarkable. Abdominal wall/soft tissues: Recent postoperative changes of L3-4 laminectomy with overlying skin staples. Multiple foci of postoperative gas within the central canal and along the posterior elements at L4. The adjacent paraspinal musculature is edematous. Overlying postoperative stranding in the subcutaneous fat and a small discrete fluid and gas collection centered at the level of L4 (series 305, image 73) measuring about 3.7 x 2.8 cm in axial dimensions and 4.1 cm in craniocaudal extent. Genital Structures: Unremarkable. Osseous structures: No acute osseous abnormalities or suspicious osseous lesions. Post surgical changes of L3-L4 laminectomy. Mild, multilevel degenerative changes are the visualized portions of the spine. Mild levoscoliosis of the thoracolumbar spine.  Remote fracture deformities including multiple collateral ribs and the right clavicle.     IMPRESSION: CHEST CTA 1.  No acute pulmonary embolism. 2.  Enlargement of the main pulmonary artery, which can be seen in the setting of pulmonary hypertension. 3.  Borderline ectasia of the ascending aorta. ABDOMEN AND PELVIS 1.  Recent postsurgical changes of L3-L4 laminectomy with small subincisional fluid and gas collection, sterility indeterminate by imaging. 2.  The 1.3 cm low-density pancreatic lesion is indeterminate with side branch IPMN favored. Recommend multiphasic CT or MRI for further evaluation. Approved by Bess Kinds, DO on 03/27/2021 4:25 PM EST I have personally reviewed the images and I agree with this report. Report  Verified by: Leretha Pol, MD at 03/27/2021 4:52 PM EST    X-ray Portable Chest    Result Date: 03/27/2021  EXAM: XR PORTABLE CHEST INDICATION: Cough TECHNIQUE: Semiupright portable. COMPARISON: November 09, 2019. FINDINGS: Medical Devices: None. Heart and Mediastinum: Cardiomediastinal silhouette is within normal limits. Lungs and Pleura: Lungs are clear. Bones and soft tissues: No acute abnormalities.     IMPRESSION: No acute cardiopulmonary abnormality. Report Verified by: Shelba Flake, MD at 03/27/2021 1:51 PM EST    CT Pulmonary Angiography    Result Date: 03/27/2021  EXAM: CTA CHEST WITH IV CONTRAST EXAM: CT ABDOMEN AND PELVIS WITH IV CONTRAST INDICATION: Sepsis and cough, concern for pulmonary embolism, assess for infectious source COMPARISON: Same day chest radiograph. TECHNIQUE: Multidetector CT imaging was performed through the lungs in the supine position following the administration of intravenous contrast according to the CTPA protocol. Thin section CT images were obtained through the abdomen and pelvis with coronal and sagittal multiplanar reconstructions at the scanner. Additional maximum intensity projection images were performed.  CONTRAST: 150 mL of IOHEXOL 350 MG IODINE/ML INTRAVENOUS SOLUTION administered intravenously (accession [id number]), 150 mL of IOHEXOL 350 MG IODINE/ML INTRAVENOUS SOLUTION administered intravenously [employer] [id number]) FINDINGS: Study quality: Adequate Artifacts: Motion artifact, mild streak artifact CHEST: Medical devices: None. Airways & lungs: The central airways are patent. Bandlike opacities are demonstrated within the anterior, bilateral mid upper lobes, likely atelectasis and/or chronic scarring. Otherwise, the lungs are clear with minimal, dependent atelectasis demonstrated at the bilateral lung bases. Pleura: No pleural effusions or pneumothorax. Mild pleural thickening along the anterolateral left upper lobe at the level of the fourth intercostal  space is nonspecific with posttraumatic or postsurgical sequela favored as there is minimal adjacent herniation of lung parenchyma. Lower Neck: Unremarkable. Pulmonary Embolism: None identified within the limitations discussed above. Heart: The heart is normal in size. Moderate coronary artery calcifications. No significant pericardial effusion. Atherosclerosis along the aortic root without definite valvular calcification. Additional vascular structures: Borderline ectasia of the ascending aorta up to 4 cm. Enlargement of the main pulmonary artery, measuring 4 cm in diameter. Mild atherosclerotic calcifications of the aortic arch and origins of the great vessels. Usual 3-vessel branch configuration of a left aortic arch. Mediastinum and hila: No suspicious adenopathy. Borderline right paratracheal and right hilar lymph nodes are nonspecific. Small hiatal hernia. Chest wall and axilla: Focal nodular thickening within the anterolateral left chest wall within the fourth intercostal space with adjacent linear stranding, suggestive of scarring (posterior medical or postsurgical). ABDOMEN AND PELVIS: Liver: Liver is normal in size and contour. Small hypoattenuating lesions within the left hepatic lobe are too small to characterize, but likely small cysts and/or hemangiomas. Biliary tree: Unremarkable. Spleen: Unremarkable. Pancreas: 1.4 x 1.3 hypoattenuating lesion within the pancreatic uncinate process (series 305, image 50). Otherwise, pancreas  is normal in caliber. Adrenal glands: Unremarkable. Kidneys/ureters/bladder: Symmetric renal enhancement. Mild, bilateral renal cortical atrophy. Tiny 2 mm nonobstructing calculus at the upper pole of the right kidney. Small subcentimeter renal cortical cyst located at the upper pole the left kidney. No hydronephrosis. The ureters are normal without hydroureter. Urinary bladder is normal. Gastrointestinal tract: Unremarkable. The appendix is normal. Lymphatics: Unremarkable.  Vasculature: Mild, scattered calcified atherosclerosis of the abdominal aorta and branching vessels without aneurysmal dilatation. Peritoneum/Retroperitoneum: Unremarkable. Abdominal wall/soft tissues: Recent postoperative changes of L3-4 laminectomy with overlying skin staples. Multiple foci of postoperative gas within the central canal and along the posterior elements at L4. The adjacent paraspinal musculature is edematous. Overlying postoperative stranding in the subcutaneous fat and a small discrete fluid and gas collection centered at the level of L4 (series 305, image 73) measuring about 3.7 x 2.8 cm in axial dimensions and 4.1 cm in craniocaudal extent. Genital Structures: Unremarkable. Osseous structures: No acute osseous abnormalities or suspicious osseous lesions. Post surgical changes of L3-L4 laminectomy. Mild, multilevel degenerative changes are the visualized portions of the spine. Mild levoscoliosis of the thoracolumbar spine. Remote fracture deformities including multiple collateral ribs and the right clavicle.     IMPRESSION: CHEST CTA 1.  No acute pulmonary embolism. 2.  Enlargement of the main pulmonary artery, which can be seen in the setting of pulmonary hypertension. 3.  Borderline ectasia of the ascending aorta. ABDOMEN AND PELVIS 1.  Recent postsurgical changes of L3-L4 laminectomy with small subincisional fluid and gas collection, sterility indeterminate by imaging. 2.  The 1.3 cm low-density pancreatic lesion is indeterminate with side branch IPMN favored. Recommend multiphasic CT or MRI for further evaluation. Approved by Bess Kinds, DO on 03/27/2021 4:25 PM EST I have personally reviewed the images and I agree with this report. Report Verified by: Leretha Pol, MD at 03/27/2021 4:52 PM EST       ASSESSMENT & PLAN     Patient will be admitted to the hospital for/with the following problems:    80  Year old post op from decompressive laminectomy with excision of synovial cyst on 12/13  who subsequently developed weakness which was found to be from Covid 19.      Covid 19 - not hypoxic or septic.    Remdesivir (age and cardiovascular disease) x 3 days.    Supportive care  Dexamethasone if starts to desaturate.    PT eval and treat due to weakness.    Post operative - s/p  decompression of the lumbar spine with removal of synovial cyst synovial cyst of the lumbar spine, spinal stenosis with neurogenic claudication - continue post operative course  Holding eliquis and aspirin till Monday 03/29/21.      CAD - stable, continue home medications except for the eliquis and aspirin until tomorrow since these were post operative instructions.  Cardiac diet    Hypertension - continue coreg and valasartan    Cardiomyopathy - as above    OSA - cpap user - reordered.    Diabetic - at home takes metformin and [place] but says sugars run high.  I have ordered ssi for now and will consider basal insulin if sugars prove to be consistently elevated.  Low carb diet    BPH - flomax reordered.      Constipation - hasn't had a bm in over 4 days.  I have ordered bisacodyl and miralax tid until bm.      Proph - cannot order blood thinner or aspirin until  Monday 03/29/21.  Have discussed with pharmacy to restart both eliquis and aspirin then.          I personally reviewed the patient's medical record, labs, radiology reports and discussed the patient's case with the ER provider submitting the patient for admission.  Admission was discussed with the patient in detail; all questions answered and patient agrees with plan.    Electronically signed,  Johann Capers  03/27/2021  8:31 PM  [facility]

## 2021-03-27 NOTE — Unmapped (Signed)
Dr. Valaria Good paged and notified about patient's current respirations of 28 and patient being on room air with no labored breathing. Per Dr. Valaria Good she is okay with patient going to the 5th floor. [employer] civil engineer notified who stated she is ready to take report.

## 2021-03-27 NOTE — Unmapped (Signed)
Pharmacy Apixaban Education    DELSON DULWORTH is a 80 y.o. male on established anticoagulation with Apixaban for A fib.  Written education was provided to the patient via the AVS.    SIYU LIU, Pharm.D.    03/27/2021 5:22 PM  Phone: [phone]    For clinical pharmacy assistance on evenings and weekends, please call [phone].

## 2021-03-27 NOTE — Unmapped (Signed)
RN gave report via phone to Ginger 5th Floor RN.

## 2021-03-27 NOTE — Unmapped (Signed)
Pt presents to ED per EMS with c/o weakness. Pt had recent back surgery  - pt reports had a cyst removed from my back. Pt reports he was up walking as usual today then became weak. Congested cough noted.

## 2021-03-27 NOTE — Unmapped (Signed)
[facility] Emergency Department Encounter     Date of evaluation: 03/27/2021    Chief Complaint     Reason for visit: Weakness    Nursing notes, Past Medical History, Past Surgical History, Social History, Allergies, and Family History were reviewed.    Patient History     HPI: [name] is a 80 y.o. male with history of as listed below who presents to the emergency department with chief complaint of weakness.  Patient underwent L3-4 laminectomy and synovial cyst resection 4 days ago at an outside hospital.  Patient was discharged home.  Reports that he felt well yesterday, although upon awakening this morning, felt generally weak.  Reports that he was unable to get out of bed and walk secondary to the profuse weakness.  There is not on one side or the other.  It is both his arms and legs.  He generally feels unwell.  Endorses that he has been coughing and feels short of breath.  Denies any abdominal pain nausea or vomiting.  Denies any associated chest pain.  Unknown if he has had a fever.  Has not been around anyone ill, although was in the hospital.  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  Patient does have history of paroxysmal A. fib and is anticoagulated on Eliquis -has been holding this medication since 12/10 for his surgical intervention, has not yet restarted    With the exception of the above, there are no aggravating or alleviating factors.    Medical History:   Past Medical History:   Diagnosis Date   ??? Atrial fibrillation (CMS Dx)     corrected with Mini-maze procedure   ??? Cataract     left eye   ??? Diabetes mellitus (CMS Dx)     type 2   ??? Dilated cardiomyopathy (CMS Dx)    ??? Hearing loss     left ear   ??? Hypercholesteremia    ??? Hypertension    ??? Sleep apnea    ??? Thyroid disease     hypothyroidism       Surgical History:   Past Surgical History:   Procedure Laterality Date   ??? COLONOSCOPY W/ POLYPECTOMY      benign  has at 3 plus procedures    ??? HERNIA REPAIR  9/09    right inguinal, mini maze   ??? HERNIA REPAIR  2/10    herniated lungs bilateral (repaired with mesh)   ??? mini-maze      corrected his A-fib   ??? rt eye  02/2018   ??? SEPTOPLASTY     ??? UVULOPALATOPHARYNGOPLASTY     ??? UVULOPALATOPHARYNGOPLASTY      has had esophageal dilations in the past (Dr.Decter)   ??? WISDOM TOOTH EXTRACTION         Medications: No current facility-administered medications for this encounter.    Current Outpatient Medications:   ???  apixaban (ELIQUIS) 5 mg Tab, Take 5 mg by mouth 2 times a day., Disp: , Rfl:   ???  aspirin 81 MG EC tablet, Take 81 mg by mouth daily., Disp: , Rfl:   ???  atorvastatin (LIPITOR) 10 MG tablet, Take 10 mg by mouth at bedtime. , Disp: , Rfl:   ???  carvedilol (COREG) 25 MG tablet, Take 25 mg by mouth 2 (two) times daily.  , Disp: , Rfl:   ???  ferrous sulfate 325 (65 FE) MG tablet, Take 325  mg by mouth daily with breakfast., Disp: , Rfl:   ???  furosemide (LASIX) 20 MG tablet, Take 1 tablet (20 mg total) by mouth daily., Disp: 30 tablet, Rfl: 0  ???  glimepiride (AMARYL) 2 MG tablet, Take 1 tablet by mouth  every morning before  breakfast, Disp: , Rfl:   ???  GLUC/CHON-MSM#1/C/MANG/BOS/BOR (OSTEO BI-FLEX ORAL), Take by mouth. OSTEO BI-FLEX JOINT SHIELD TABS , Disp: , Rfl:   ???  glyBURIDE-metformin (GLUCOVANCE) 2.5-500 mg per tablet, Take 2 tablets by mouth 2 times a day. , Disp: , Rfl:   ???  levothyroxine (SYNTHROID, LEVOTHROID) 100 MCG tablet, Take 100 mcg by mouth every morning. , Disp: , Rfl:   ???  omeprazole (PRILOSEC) 40 MG capsule, Take 40 mg by mouth every morning. , Disp: , Rfl:   ???  propafenone (RYTHMOL) 225 MG tablet, Take 225 mg by mouth 3 times a day., Disp: , Rfl:   ???  tamsulosin (FLOMAX) 0.4 mg Cap, Take 0.4 mg by mouth at bedtime., Disp: , Rfl:   ???  valsartan (DIOVAN) 320 MG tablet, Take 320 mg by mouth daily., Disp: , Rfl:     Social History: [name]  reports that he has never smoked. He has never used smokeless tobacco. He reports that he  does not drink alcohol and does not use drugs.    Allergies:   Allergies as of 03/27/2021   ??? (No Known Allergies)        Review of Systems     ROS   As stated above, all other systems reviewed and are otherwise negative.     Patient History     Vitals:    03/27/21 1327 03/27/21 1343 03/27/21 1400 03/27/21 1420   BP: 171/85 171/85  (!) 178/102   BP Location: Right arm Right arm     Patient Position: Sitting Sitting     Pulse: 99  100 99   Resp: 16  (!) 41 27   Temp: 100 ??F (37.8 ??C)      TempSrc: Oral      SpO2: (!) 88% 97% 97% 96%       General: 80 y.o. male appears ill. well developed, well nourished. Slightly confused    HEENT: Atraumatic, normocephalic. EOMs intact.      Neck:  Full range of motion.     Chest/Pulm: Tachypnea present. Speaks in complete sentences, no respiratory distress. Lungs CTA bilaterally, no wheezes, rales, rhonchi.     Cardiovascular: Heart rate borderline tachycardic. RRR, no murmurs, rubs, gallops appreciated.     Abdomen: Soft, non-tender, non-peritoneal. No gross distension. No suprapubic or CVAT.     Musculoskeletal: Moves all extremities equally - generally weak. No obvious long bone deformities. No LE edema, calf tenderness or palpable cords *    Neuro: A&O.  Normal speech without dysarthria or aphasia. Moves all extremities spontaneously and symmetrically.    Skin: Warm, dry. No obvious rashes, petechiae, or cyanosis. Well healing surgical scar without evidence of infection     Psych: Appropriate mood and affect, normal interaction.     Diagnostic Studies     Labs: Please see electronic medical record for any tests performed in the ED   Labs Reviewed   BASIC METABOLIC PANEL - Abnormal; Notable for the following components:       Result Value    Sodium 132 (*)     Chloride 97 (*)     Glucose 247 (*)     All other  components within normal limits   CBC - Abnormal; Notable for the following components:    WBC 15.1 (*)     RBC 4.19 (*)     Hemoglobin 11.4 (*)     Hematocrit 34.9 (*)      RDW 15.6 (*)     MPV 6.9 (*)     All other components within normal limits   DIFFERENTIAL - Abnormal; Notable for the following components:    Neutrophils Absolute 10,751 (*)     Monocytes Absolute 1,480 (*)     All other components within normal limits   VENOUS BLOOD GAS, LINE/SYRINGE - Abnormal; Notable for the following components:    PCO2-Line Draw 52 (*)     PO2-Line Draw 22 (*)     HCO3-Line Draw 31 (*)     CO2 Content-Line Draw 32 (*)     Base Excess-Line Draw 4.6 (*)     %HBO2-Line Draw 35.9 (*)     Reduced Hemoglobin-Line Draw 61.7 (*)     All other components within normal limits   URINALYSIS W/RFL TO MICROSCOPIC - Abnormal; Notable for the following components:    Protein, UA 100 mg/dL (*)     Glucose, UA >=1610 mg/dL (*)     Blood, UA Trace-lysed (*)     All other components within normal limits   INFLUENZA A AND B, COVID, RSV COMBINATION ASSAY, NAA - Abnormal; Notable for the following components:    SARS-CoV-2 Positive (*)     All other components within normal limits    Narrative:     Does the patient have symptoms of Covid-19 (eg. Fever, dyspnea, cough, loss of smell)?->Yes Does the patient urgently (in <24 hours) require a surgery, invasive procedure, or cardiac stress test?->No Is the patient being admitted to labor and delivery in active labor?->No Does the patient fit any of these categories? (select all that apply)->None of the above   B NATRIURETIC PEPTIDE    Narrative:     The presence of high concentrations of biotin may cause falsely lowered BNP results. Biotin interference may be seen if an individual is taking >5 mg biotin per day. Interpret BNP results in the context of the patient's clinical presentation.   LACTIC ACID   HIGH SENSITIVITY TROPONIN       IMAGING STUDIES / RADIOLOGY: Please see electronic medical record for any tests performed in the ED  X-ray Portable Chest   Final Result   IMPRESSION:    No acute cardiopulmonary abnormality.      Report Verified by: Shelba Flake,  MD at 03/27/2021 1:51 PM EST      CT Pulmonary Angiography    (Results Pending)   CT Abdomen and Pelvis With IV contrast    (Results Pending)   CT Head WO contrast    (Results Pending)       EKG:  Seen and Interpreted by myself and the attending physician, Dr. Daleen Squibb  Indication: Shortness of breath  Finding: Accelerated junctional rhythm, incomplete right bundle  No evidence for acute ischemia or ST segment changes compared to 11/15/2019  Ventricular Rate: 97 ms  PR interval: Indeterminate ms  QRS Duration: 114 ms  QT/QTc: 338/429 ms  ST Segments: no elevation or depression  T waves: no inversions    ED Procedures     Procedures  None    ED Course     ED Medications Administered:  Medications - No data to display    CONSULTS: None  MDM     Vitals:    03/27/21 1327 03/27/21 1343 03/27/21 1400 03/27/21 1420   BP: 171/85 171/85  (!) 178/102   BP Location: Right arm Right arm     Patient Position: Sitting Sitting     Pulse: 99  100 99   Resp: 16  (!) 41 27   Temp: 100 ??F (37.8 ??C)      TempSrc: Oral      SpO2: (!) 88% 97% 97% 96%       [name] is a 80 y.o. male who presents to the emergency department with weakness.     The patient arrived hypoxic to 80% on room air, placed on 2 L nasal cannula with improvement.  Patient is borderline tachycardic and is tachypneic.  Blood pressures are stable.  Patient appears acutely ill.  Physical examination reveals tachypnea with abdominal breathing present, no significant adventitious breath sounds present.  Heart sounds unremarkable.  Abdomen is large, although no significant tenderness present.  Patient does appear confused and did urinate on himself.    EKG was obtained without any acute ischemic changes present.  Laboratory studies are pertinent for a leukocytosis of 15.1.  VBG with CO2 retention of 52, although with a compensated pH of 7.38.  Renal panel stable.  Lactate reassuring at 0.9.  High-sensitivity troponin of 12 with a delta pending.  BNP is not  significantly elevated.  Chest x-ray was obtained which reveals no acute cardiopulmonary abnormality.  With patient's confusion and potential for fall with his significant weakness, cross-sectional imaging of the head will be obtained.  We will also obtain a CTPA of the chest with continuation through the abdomen and pelvis for further evaluation of potential pulmonary embolus and/or intra-abdominal pathology attributing to patient's presentation today.    At this time I am going off service and will be signing out care of the patient to the Attending Physician for further care. CT scans, ultimate admission are still pending. Oncoming provider responsibilities include following up on pending labs/tests, reassessing patient, and appropriate disposition. Please see medical record for further management and medical decision making.    The patient was evaluated by myself and the ED Attending Physician, Dr. Daleen Squibb. All management and disposition plans were discussed and agreed upon.    Clinical Impression     COVID-19    Disposition     Pending    Gilmer Mor, PA-C  Department of Emergency Medicine     Gilmer Mor, [place]  03/27/21 1439

## 2021-03-27 NOTE — Unmapped (Signed)
ED Attending Attestation Note    Date of service:  03/27/2021    This patient was seen by the APP .  I have seen and examined the patient, agree with the workup, evaluation, management and diagnosis. The care plan has been discussed and I concur.  I have reviewed the ECG and concur with the APP  interpretation.    My assessment reveals a 80 y.o. male presented to the emergency department for evaluation for fever and generalized weakness.    Emergency department course and medical decision making the patient presented emerged department status post surgery which was 4 days ago at a another care facility.  The patient presents with generalized weakness, fever and cough and presents for evaluation assessment as stated above.  The patient had initial screen labs and evaluation for concerns of underlying infection and concern for hospital-acquired pneumonia, further disposition awaiting laboratory results and reassessment.

## 2021-03-27 NOTE — Unmapped (Signed)
[facility] ED  Reassessment Note    [name] is a 80 y.o. male who presented to the emergency department on 03/27/2021. This patient was initially seen by an off-going provider and their care has been turned over to me. Please see the original provider's note for details regarding the initial history, physical exam and ED course.  At the time of turnover the following steps in the patient's evaluation were pending: Reassessment, CT scan results and disposition    Patient continues to have some moderate resting tachypnea although no severe accessory muscle use and able to speak in full sentences.  Coarse breath sounds bilaterally.  No resting oxygen requirement    Due to patient's generalized weakness and inability to care for self at home today requiring EMS assistance to get off the couch.  As well as his resting dyspnea and advanced age in the setting of new COVID infection.  Patient will be admitted for ongoing monitoring and to assess any needs for short-term rehab placement.

## 2021-03-28 LAB — HEPATIC FUNCTION PANEL
ALT: 13 U/L (ref 7–52)
ALT: 13 U/L (ref 7–52)
AST: 12 U/L (ref 13–39)
AST: 12 U/L — ABNORMAL LOW (ref 13–39)
Albumin: 3.4 g/dL (ref 3.5–5.7)
Albumin: 3.4 g/dL — ABNORMAL LOW (ref 3.5–5.7)
Alkaline Phosphatase: 78 U/L (ref 36–125)
Alkaline Phosphatase: 78 U/L (ref 36–125)
Bilirubin, Direct: 0.4 mg/dL (ref 0.0–0.4)
Bilirubin, Direct: 0.4 mg/dL (ref 0.0–0.4)
Bilirubin, Indirect: 0.8 mg/dL (ref 0.0–1.1)
Bilirubin, Indirect: 0.8 mg/dL (ref 0.0–1.1)
Total Bilirubin: 1.2 mg/dL (ref 0.0–1.5)
Total Bilirubin: 1.2 mg/dL (ref 0.0–1.5)
Total Protein: 5.7 g/dL (ref 6.4–8.9)
Total Protein: 5.7 g/dL — ABNORMAL LOW (ref 6.4–8.9)

## 2021-03-28 LAB — POCT GLUCOSE MONITORING DEVICE
Glucose: 241 mg/dL — ABNORMAL HIGH (ref 70–100)
Glucose: 270 mg/dL — ABNORMAL HIGH (ref 70–100)
Glucose: 249 mg/dL — ABNORMAL HIGH (ref 70–100)
Glucose: 300 mg/dL — ABNORMAL HIGH (ref 70–100)
Glucose: 278 mg/dL — ABNORMAL HIGH (ref 70–100)

## 2021-03-28 LAB — BASIC METABOLIC PANEL
Anion Gap: 8 mmol/L (ref 3–16)
Anion Gap: 8 mmol/L (ref 3–16)
BUN: 21 mg/dL (ref 7–25)
BUN: 21 mg/dL (ref 7–25)
CO2: 27 mmol/L (ref 21–33)
CO2: 27 mmol/L (ref 21–33)
Calcium: 8.2 mg/dL (ref 8.6–10.3)
Calcium: 8.2 mg/dL — ABNORMAL LOW (ref 8.6–10.3)
Chloride: 97 mmol/L (ref 98–110)
Chloride: 97 mmol/L — ABNORMAL LOW (ref 98–110)
Creatinine: 1.12 mg/dL (ref 0.60–1.30)
Creatinine: 1.12 mg/dL (ref 0.60–1.30)
EGFR: 66
GFR, Estimated: 66
Glucose: 224 mg/dL (ref 70–100)
Glucose: 224 mg/dL — ABNORMAL HIGH (ref 70–100)
Osmolality Calc: 284 mOsm/kg (ref 278–305)
Osmolality, Calculated: 284 mOsm/kg (ref 278–305)
Potassium: 4.2 mmol/L (ref 3.5–5.3)
Potassium: 4.2 mmol/L (ref 3.5–5.3)
Sodium: 132 mmol/L (ref 133–146)
Sodium: 132 mmol/L — ABNORMAL LOW (ref 133–146)

## 2021-03-28 LAB — CBC
Hematocrit: 32.6 % (ref 38.5–50.0)
Hematocrit: 32.6 % — ABNORMAL LOW (ref 38.5–50.0)
Hemoglobin: 10.7 g/dL (ref 13.2–17.1)
Hemoglobin: 10.7 g/dL — ABNORMAL LOW (ref 13.2–17.1)
MCH: 27.5 pg (ref 27.0–33.0)
MCH: 27.5 pg (ref 27.0–33.0)
MCHC: 32.8 g/dL (ref 32.0–36.0)
MCHC: 32.8 g/dL (ref 32.0–36.0)
MCV: 83.7 fL (ref 80.0–100.0)
MCV: 83.7 fL (ref 80.0–100.0)
MPV: 7.1 fL (ref 7.5–11.5)
MPV: 7.1 fL — ABNORMAL LOW (ref 7.5–11.5)
Platelets: 178 10*3/uL (ref 140–400)
Platelets: 178 10*3/uL (ref 140–400)
RBC: 3.89 10*6/uL (ref 4.20–5.80)
RBC: 3.89 10*6/uL — ABNORMAL LOW (ref 4.20–5.80)
RDW: 15.8 % (ref 11.0–15.0)
RDW: 15.8 % — ABNORMAL HIGH (ref 11.0–15.0)
WBC: 11.7 10*3/uL (ref 3.8–10.8)
WBC: 11.7 10*3/uL — ABNORMAL HIGH (ref 3.8–10.8)

## 2021-03-28 LAB — HEMOGLOBIN A1C
Hemoglobin A1C: 8.7 % (ref 4.0–5.6)
Hemoglobin A1C: 8.7 % — ABNORMAL HIGH (ref 4.0–5.6)

## 2021-03-28 LAB — POC GLU MONITORING DEVICE
POC Glucose Monitoring Device: 241 mg/dL (ref 70–100)
POC Glucose Monitoring Device: 270 mg/dL (ref 70–100)
POC Glucose Monitoring Device: 249 mg/dL (ref 70–100)
POC Glucose Monitoring Device: 300 mg/dL (ref 70–100)
POC Glucose Monitoring Device: 278 mg/dL (ref 70–100)

## 2021-03-28 MED ORDER — polyethylene glycol (MIRALAX) packet 17 g
17 | Freq: Three times a day (TID) | ORAL | Status: AC | PRN
Start: 2021-03-28 — End: 2021-03-31

## 2021-03-28 MED ORDER — bisacodyL (DULCOLAX) EC tablet 10 mg
5 | Freq: Every day | ORAL | Status: AC | PRN
Start: 2021-03-28 — End: 2021-03-31

## 2021-03-28 MED ORDER — insulin glargine (LANTUS) injection 5 Units
100 | Freq: Every evening | SUBCUTANEOUS | Status: AC
Start: 2021-03-28 — End: 2021-03-31
  Administered 2021-03-29 – 2021-03-31 (×3): 5 [IU] via SUBCUTANEOUS

## 2021-03-28 MED ORDER — insulin glargine (LANTUS) injection 10 Units
100 | Freq: Every evening | SUBCUTANEOUS | Status: AC
Start: 2021-03-28 — End: 2021-03-28

## 2021-03-28 MED FILL — CARVEDILOL 25 MG TABLET: 25 25 MG | ORAL | Qty: 1

## 2021-03-28 MED FILL — VITAMIN C 500 MG TABLET: 500 500 MG | ORAL | Qty: 2

## 2021-03-28 MED FILL — SYNTHROID 100 MCG TABLET: 100 100 mcg | ORAL | Qty: 1

## 2021-03-28 MED FILL — PROPAFENONE 150 MG TABLET: 150 150 MG | ORAL | Qty: 2

## 2021-03-28 MED FILL — VALSARTAN 160 MG TABLET: 160 160 MG | ORAL | Qty: 2

## 2021-03-28 MED FILL — TAMSULOSIN 0.4 MG CAPSULE: 0.4 0.4 mg | ORAL | Qty: 1

## 2021-03-28 MED FILL — FERROUS SULFATE 325 MG (65 MG IRON) TABLET: 325 325 (65 FE) MG | ORAL | Qty: 1

## 2021-03-28 MED FILL — PANTOPRAZOLE 40 MG TABLET,DELAYED RELEASE: 40 40 MG | ORAL | Qty: 1

## 2021-03-28 MED FILL — REMDESIVIR 100 MG INTRAVENOUS POWDER FOR SOLUTION: 100 100 mg | INTRAVENOUS | Qty: 2

## 2021-03-28 MED FILL — TYLENOL 325 MG TABLET: 325 325 mg | ORAL | Qty: 2

## 2021-03-28 MED FILL — SODIUM CHLORIDE 0.9 % INTRAVENOUS SOLUTION: 75.00 75.00 mL/hr | INTRAVENOUS | Qty: 1000

## 2021-03-28 MED FILL — ZINC SULFATE 50 MG ZINC (220 MG) CAPSULE: 50 50 mg zinc (220 mg) | ORAL | Qty: 1

## 2021-03-28 MED FILL — LANTUS U-100 INSULIN 100 UNIT/ML SUBCUTANEOUS SOLUTION: 100 100 unit/mL | SUBCUTANEOUS | Qty: 0.05

## 2021-03-28 NOTE — Unmapped (Addendum)
Daily Progress Note    Patient: [name]   Date of Birth: 02-Jan-1941    Room/Bed: 161/W960   MRN: [id number]     Admitting Physician:     Primary Care Physician: Marcello Fennel, MD         Subjective:     Chief Complaint   Patient presents with   ??? Weakness       [name] states he was not able to walk when he came in. Just had back surgery. Walks with walker when able.     Denies sob, chest pain.    Review of Systems:    Negative except for listed in HPI.    Past Medical History:   Diagnosis Date   ??? Atrial fibrillation (CMS Dx)     corrected with Mini-maze procedure   ??? Cataract     left eye   ??? Diabetes mellitus (CMS Dx)     type 2   ??? Dilated cardiomyopathy (CMS Dx)    ??? Hearing loss     left ear   ??? Hypercholesteremia    ??? Hypertension    ??? Sleep apnea    ??? Thyroid disease     hypothyroidism       No Known Allergies     Scheduled Meds:  ??? [START ON 03/29/2021] apixaban  5 mg Oral BID   ??? ascorbic acid (vitamin C)  1,000 mg Oral BID   ??? [START ON 03/29/2021] aspirin  81 mg Oral Daily with breakfast   ??? atorvastatin  10 mg Oral Nightly (2100)   ??? carvediloL  25 mg Oral BID   ??? ferrous sulfate  325 mg Oral Daily with breakfast   ??? insulin lispro  0-10 Units Subcutaneous Nightly (2100)   ??? insulin lispro  0-12 Units Subcutaneous TID AC   ??? levothyroxine  100 mcg Oral QAM   ??? pantoprazole  40 mg Oral DAILY 0600   ??? propafenone  225 mg Oral TID   ??? [START ON 03/29/2021] remdesivir in sodium chloride 0.9% 250 mL IV infusion  100 mg Intravenous Daily   ??? tamsulosin  0.4 mg Oral Nightly (2100)   ??? valsartan  320 mg Oral Daily 0900   ??? zinc sulfate  220 mg Oral Daily 0900     Continuous Infusions:  ??? sodium chloride 0.9 % 75 mL/hr (03/27/21 2325)     PRN Meds:.acetaminophen, bisacodyL, dextrose 50 % in water (D50W) **OR** dextrose 50 % in water (D50W), glucose, ondansetron, polyethylene glycol, sodium chloride 0.9%      Objective:     BP 146/88 (BP Location: Right arm, Patient Position: Lying)    Pulse 77     Temp 98.6 ??F (37 ??C) (Oral)    Resp 20    Ht 6' (1.829 m)    Wt (!) 260 lb 2.3 oz (118 kg)    SpO2 92%    BMI 35.28 kg/m??      Intake and Output:  Current Shift: No intake/output data recorded.  Last three shifts: 12/16 1600 - 12/18 0659  In: 300 [P.O.:300]  Out: 400 [Urine:400]     Physical Exam:     Gen: A&O x 3, NAD  HEENT: AT/NC, MMM, no jaundice, EOMI, oropharynx normal, no tonsillar erythema or exudate, normal gums & dentition  Neck: trachea midline, no cervical lymphadenopathy, normal ROM, no nuchal rigidity   Cardiovascular: RRR, nMRG, radial pulses 2+, capillary refill < 3 seconds at Bilateral upper extremeties.  Respiratory: CTA, no retractions  Abdominal: soft, NT, BS +  Extremities: No pretibial edema, no clubbing, cyanosis  Skin: warm, dry; no rashes, petechia, bruising  Neuro: CN 2-12 intact by observation.  Psych: normal affect, judgement        Labs:   Recent Results (from the past 24 hour(s))   Basic metabolic panel    Collection Time: 03/27/21  1:40 PM   Result Value Ref Range    Sodium 132 (L) 133 - 146 mmol/L    Potassium 4.9 3.5 - 5.3 mmol/L    Chloride 97 (L) 98 - 110 mmol/L    CO2 28 21 - 33 mmol/L    Anion Gap 7 3 - 16 mmol/L    BUN 22 7 - 25 mg/dL    Creatinine 9.56 2.13 - 1.30 mg/dL    Glucose 086 (H) 70 - 100 mg/dL    Calcium 9.0 8.6 - 57.8 mg/dL    Osmolality, Calculated 286 278 - 305 mOsm/kg    EGFR 66    B Natriuretic Peptide    Collection Time: 03/27/21  1:40 PM   Result Value Ref Range    BNP 96 0 - 100 pg/mL   CBC    Collection Time: 03/27/21  1:40 PM   Result Value Ref Range    WBC 15.1 (H) 3.8 - 10.8 10E3/uL    RBC 4.19 (L) 4.20 - 5.80 10E6/uL    Hemoglobin 11.4 (L) 13.2 - 17.1 g/dL    Hematocrit 46.9 (L) 38.5 - 50.0 %    MCV 83.3 80.0 - 100.0 fL    MCH 27.2 27.0 - 33.0 pg    MCHC 32.7 32.0 - 36.0 g/dL    RDW 62.9 (H) 52.8 - 15.0 %    Platelets 186 140 - 400 10E3/uL    MPV 6.9 (L) 7.5 - 11.5 fL   Differential    Collection Time: 03/27/21  1:40 PM   Result Value Ref Range     Neutrophils Relative 71.2 40.0 - 80.0 %    Lymphocytes Relative 17.1 15.0 - 45.0 %    Monocytes Relative 9.8 0.0 - 12.0 %    Eosinophils Relative 1.5 0.0 - 8.0 %    Basophils Relative 0.4 0.0 - 1.0 %    nRBC 0 0 - 0 /100 WBC    Neutrophils Absolute 10,751 (H) 1,500 - 7,800 /uL    Lymphocytes Absolute 2,582 850 - 3,900 /uL    Monocytes Absolute 1,480 (H) 200 - 950 /uL    Eosinophils Absolute 227 15 - 500 /uL    Basophils Absolute 60 0 - 200 /uL   Lactic Acid    Collection Time: 03/27/21  1:40 PM   Result Value Ref Range    Lactate 0.9 0.5 - 2.2 mmol/L   High Sensitivity Troponin    Collection Time: 03/27/21  1:40 PM   Result Value Ref Range    High Sensitivity Troponin 12 0 - 20 ng/L   Venous blood gas    Collection Time: 03/27/21  1:40 PM   Result Value Ref Range    PH-Line Draw 7.38 7.32 - 7.42    PCO2-Line Draw 52 (H) 41 - 51 mm Hg    PO2-Line Draw 22 (L) 25 - 40 mm Hg    HCO3-Line Draw 31 (H) 24 - 28 mmol/L    CO2 Content-Line Draw 32 (H) 25 - 29 mmol/L    Base Excess-Line Draw 4.6 (H) -2.0 - 3.0 mmol/L    %  HBO2-Line Draw 35.9 (L) 40.0 - 70.0 %    Carboxyhgb-Line Draw 1.6 %    Methemoglobin-Line Draw 0.8 0.0 - 1.5 %    Reduced Hemoglobin-Line Draw 61.7 (H) 0.0 - 5.0 %   Influenza A and B, COVID, RSV Combination Assay, NAA    Collection Time: 03/27/21  1:40 PM   Result Value Ref Range    Influenza A Negative Negative,Not Detected    Influenza B Negative Negative,Not Detected    RSV Negative Negative    SARS-CoV-2 Positive (AA) Not Detected,Negative    Test LOINC ordered [phone]     Device identifier Cepheid_Xpert Xpress SARS-CoV-2/Flu/RSV (Lab)_EUA     First Test No     Healthcare employee No     Symptomatic Unknown     Hospitalized Yes     In ICU Unknown     Congregate Care Resident No     Pregnant No    Urinalysis w/Rfl to Microscopic    Collection Time: 03/27/21  2:08 PM   Result Value Ref Range    Color, UA Yellow Straw,Yellow    Clarity, UA Clear Clear    Specific Gravity, UA >=1.030 1.005 - 1.035    pH, UA  5.5 5.0 - 8.0    Protein, UA 100 mg/dL (A) Negative mg/dL    Glucose, UA >=0454 mg/dL (A) Negative mg/dL    Ketones, UA Negative Negative mg/dL    Bilirubin, UA Negative Negative    Blood, UA Trace-lysed (A) Negative    Nitrite, UA Negative Negative    Urobilinogen, UA 2.0 E.U./dL 0.2 - 1.0 EU/dL    Leukocytes, UA Negative Negative    RBC, UA 2 0 - 3 /HPF   POC Glucose Monitoring Device    Collection Time: 03/27/21 11:02 PM   Result Value Ref Range    POC Glucose Monitoring Device 241 (H) 70 - 100 mg/dL   DAY 1: BASELINE Hepatic Function Panel    Collection Time: 03/27/21 11:58 PM   Result Value Ref Range    Total Bilirubin 1.2 0.0 - 1.5 mg/dL    Bilirubin, Direct 0.4 0.0 - 0.4 mg/dL    AST 12 (L) 13 - 39 U/L    ALT 13 7 - 52 U/L    Alkaline Phosphatase 78 36 - 125 U/L    Total Protein 5.7 (L) 6.4 - 8.9 g/dL    Albumin 3.4 (L) 3.5 - 5.7 g/dL    Bilirubin, Indirect 0.8 0.0 - 1.1 mg/dL   POC Glucose Monitoring Device    Collection Time: 03/28/21  2:53 AM   Result Value Ref Range    POC Glucose Monitoring Device 270 (H) 70 - 100 mg/dL   CBC    Collection Time: 03/28/21  5:06 AM   Result Value Ref Range    WBC 11.7 (H) 3.8 - 10.8 10E3/uL    RBC 3.89 (L) 4.20 - 5.80 10E6/uL    Hemoglobin 10.7 (L) 13.2 - 17.1 g/dL    Hematocrit 09.8 (L) 38.5 - 50.0 %    MCV 83.7 80.0 - 100.0 fL    MCH 27.5 27.0 - 33.0 pg    MCHC 32.8 32.0 - 36.0 g/dL    RDW 11.9 (H) 14.7 - 15.0 %    Platelets 178 140 - 400 10E3/uL    MPV 7.1 (L) 7.5 - 11.5 fL   Basic metabolic panel    Collection Time: 03/28/21  5:06 AM   Result Value Ref Range    Sodium 132 (L)  133 - 146 mmol/L    Potassium 4.2 3.5 - 5.3 mmol/L    Chloride 97 (L) 98 - 110 mmol/L    CO2 27 21 - 33 mmol/L    Anion Gap 8 3 - 16 mmol/L    BUN 21 7 - 25 mg/dL    Creatinine 1.61 0.96 - 1.30 mg/dL    Glucose 045 (H) 70 - 100 mg/dL    Calcium 8.2 (L) 8.6 - 10.3 mg/dL    Osmolality, Calculated 284 278 - 305 mOsm/kg    EGFR 66        Imaging:    CT Abdomen and Pelvis With IV contrast   Final  Result   IMPRESSION:      CHEST CTA   1.  No acute pulmonary embolism.   2.  Enlargement of the main pulmonary artery, which can be seen in the setting of pulmonary hypertension.   3.  Borderline ectasia of the ascending aorta.      ABDOMEN AND PELVIS   1.  Recent postsurgical changes of L3-L4 laminectomy with small subincisional fluid and gas collection, sterility indeterminate by imaging.   2.  The 1.3 cm low-density pancreatic lesion is indeterminate with side branch IPMN favored. Recommend multiphasic CT or MRI for further evaluation.      Approved by Bess Kinds, DO on 03/27/2021 4:25 PM EST      I have personally reviewed the images and I agree with this report.      Report Verified by: Leretha Pol, MD at 03/27/2021 4:52 PM EST      CT Pulmonary Angiography   Final Result   IMPRESSION:      CHEST CTA   1.  No acute pulmonary embolism.   2.  Enlargement of the main pulmonary artery, which can be seen in the setting of pulmonary hypertension.   3.  Borderline ectasia of the ascending aorta.      ABDOMEN AND PELVIS   1.  Recent postsurgical changes of L3-L4 laminectomy with small subincisional fluid and gas collection, sterility indeterminate by imaging.   2.  The 1.3 cm low-density pancreatic lesion is indeterminate with side branch IPMN favored. Recommend multiphasic CT or MRI for further evaluation.      Approved by Bess Kinds, DO on 03/27/2021 4:25 PM EST      I have personally reviewed the images and I agree with this report.      Report Verified by: Leretha Pol, MD at 03/27/2021 4:52 PM EST      CT Head WO contrast   Final Result   IMPRESSION:      1.  Limited study due to patient motion. No acute intracranial abnormality.   2.  No intracranial mass effect or hemorrhage.         Report Verified by: Drake Leach, MD at 03/27/2021 3:57 PM EST      X-ray Portable Chest   Final Result   IMPRESSION:    No acute cardiopulmonary abnormality.      Report Verified by: Shelba Flake, MD at 03/27/2021  1:51 PM EST              A/P     Pt is a 80 y.o., male with pmh sig for afib s/p mini MAZE procedure and anticoagulated with eliquis, dilated cardiomyopathy, diabetes, OSA, who is here with acute covid 19 infection, who presented on 03/27/2021 with weakness which was found to be from Covid 19.    ??  Covid  19 - not hypoxic or septic.  Wbc 11.7  Remdesivir (age and cardiovascular disease) (day 2 of 3)  Supportive care  Not hypoxic, so holding of on decadron  PT/OT eval and treat due to weakness.  ??  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    ??  CAD - stable, continue home medications except for the eliquis and aspirin until tomorrow since these were post operative instructions.  Cardiac diet  ??  Hypertension - bp stable, continue coreg and valasartan  ??  Cardiomyopathy - as above  ??  OSA - cpap user - reordered.  ??  Diabetic -  Ssi, bs elevated in 200's so added lantus 5 units  ??  BPH - flomax reordered.    ??  Constipation - hasn't had a bm in over 4 days.  I have ordered bisacodyl and miralax tid until bm.    ??  Proph - cannot order blood thinner or aspirin until Monday 03/29/21.  Have discussed with pharmacy to restart both eliquis and aspirin then.      FEN/GI - diabetic diet    Dispo -  Await PT/OT recs for dispo planning    Patient will require inpatient services with estimated LOS>2 midnights due to above (assessment and plan) requiring treatment with iv antiviral and is at increased risk of complications.          Signed By: Orinda Kenner

## 2021-03-28 NOTE — Unmapped (Signed)
Occupational Therapy  Initial Assessment and Tentative Discharge     Name: [name]  DOB: 1941/03/23  Attending Physician: Orinda Kenner, DO  Admission Diagnosis: Tachypnea [R06.82]  Malaise and fatigue [R53.81, R53.83]  COVID-19 [U07.1]  Date: 03/28/2021  Reviewed Pertinent hospital course: Yes  Hospital Course PT/OT: [name] is an 80 y.o. man who presented with severe weakness. Dx: Covid-19  Relevant PMH : L3-4 laminectomy and synovial cyst resection 03/23/21; afib  Precautions: Covid-19, MRSA, spinal precautions, fall risk, & full code  Activity Level: Activity as tolerated    Assessment  OT 6 Clicks  Help From Another Person To Put On/Take Off Regular Lower Body Clothing: Total  Help From Another Person Bathing ( including washing ,rinsing,drying): A lot  Help From Another Person Toileting, which includes using the toilet, bedpan or urinal: A lot (for bowel movement)  Help From Another Person To Put On/Take Off Regular Upper Body Clothing: A little  Help From Another Person Taking Care Of Personal Grooming: A little  Help From Another Person Eating Meals: None  OT 6 Clicks Score: 15  Assessment: Decreased activity tolerance, Decreased Functional Mobility, Decreased ADL status, Decreased Balance, Decreased self-care transfers, Decreased Safe judgment during ADL        Co-treatment indicated: secondary to anticipated level of skilled assistance required to safely treat and mobilize patient       Prognosis for OT goals: Good                        Goals  Goals to be met in: 1 week, 04/04/21  Patient stated goal: to go home  Patient will complete supine to sit in prep for ADLs: Moderate assistance  Patient will complete functional chair transfer: Minimal assistance  Patient will complete toilet transfer: Minimal assistance  Patient will complete toileting: Minimal assistance  Patient will complete grooming task: Minimal assistance  Patient will complete lower body dressing: Maximum assistance  (with ae as needed)  Collaborated with: Patient    Recommendation  Plan  Treatment Interventions: ADL retraining, Activity Tolerance training, Functional transfer training, Patient/Family training, Therapeutic Activity, Compensatory technique education  OT Frequency: minimum 4x/week  Recommendation  Recommendation: Short-term skilled OT  Equipment Recommendations: Defer at this time      Problem List  Patient Active Problem List   Diagnosis   ??? Other, multiple, and unspecified sites, insect bite, nonvenomous, without mention of infection(919.4)   ??? Hemangioma of intra-abdominal structures   ??? Other specified disorders of liver   ??? 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 Disorder of esophagus   ??? Chest pain   ??? Contact dermatitis and other eczema, due to unspecified cause   ??? Morbid obesity (CMS Dx)   ??? Obstructive sleep apnea   ??? Hypothyroidism   ??? Type II or unspecified type diabetes mellitus without mention of complication, not stated as uncontrolled (CMS Dx)   ??? Other and unspecified hyperlipidemia   ??? Essential hypertension   ??? Atrial fibrillation (CMS Dx)   ??? Congestive heart failure (CMS Dx)   ??? Hemorrhoids   ??? Esophageal reflux   ??? Pain in limb   ??? Special screening for malignant neoplasm of prostate   ??? Multiple rib fractures   ??? OSA (obstructive sleep apnea)        Past Medical History  Past Medical History:   Diagnosis Date   ??? Atrial fibrillation (CMS Dx)  corrected with Mini-maze procedure   ??? Cataract     left eye   ??? Diabetes mellitus (CMS Dx)     type 2   ??? Dilated cardiomyopathy (CMS Dx)    ??? Hearing loss     left ear   ??? Hypercholesteremia    ??? Hypertension    ??? Sleep apnea    ??? Thyroid disease     hypothyroidism        Past Surgical History  Past Surgical History:   Procedure Laterality Date   ??? COLONOSCOPY W/ POLYPECTOMY      benign  has at 3 plus procedures   ??? HERNIA REPAIR  9/09    right inguinal, mini maze   ??? HERNIA REPAIR  2/10    herniated lungs bilateral (repaired with mesh)    ??? mini-maze      corrected his A-fib   ??? rt eye  02/2018   ??? SEPTOPLASTY     ??? UVULOPALATOPHARYNGOPLASTY     ??? UVULOPALATOPHARYNGOPLASTY      has had esophageal dilations in the past (Dr.Decter)   ??? WISDOM TOOTH EXTRACTION         Home Living/Prior Function  Patient able to provide accurate information at this time: Yes  Lives With: Spouse (wife has dementia, pt is caregiver, niece is staying with wife)  Assistance available: 24 hour assistance (niece)  Type of Home: House  Home Entry: 1 step to enter  Home Layout: Multi-level;Able to live on main level with bedroom/bathroom  Stairs within: yes but pt sleeps on main level, uses BSC  Bathroom Shower/Tub: Pension scheme manager: Raised  Bathroom Equipment: Grab bars in shower;Hand-held shower  Home Equipment: Agricultural consultant;Bedside commode;Wheelchair-manual;Lift chair (wife has hospital bed)  Additional Comments: pt sleeps in reclining lift chair  Prior Function  Functional Mobility: Independent ( no assistive device)  Uses Assistive Device: Rolling walker (since surgery, no AD prior to sx)  Receives Help From: None needed prior to admission  ADL Assistance: Independent  IADL Assistance: Independent  Vocation: Retired     Pain  Pain Score: 0 - No Pain  Therapist reported pain to: Network engineer  Overall Vision/ Perception: Impaired  Impairments: Basic Vision  Current Vision: Wears glasses only for reading       Cognition  Overall Cognitive Status: Within Functional Limits  Cognitive Assessment: Arousal/ Alertness;Orientation Level;Behavior;Following Commands;Safety Judgment;Insight;Memory  Arousal/Alertness: Alert  Orientation Level: Oriented X4  Behavior: Appropriate;Cooperative  Following Commands: Follows multistep commands;Requires verbal cues  Safety Judgment: Good awareness of safety precautions  Insight: Demonstrated intact insight into limitation and abilities to complete ADL's safely    Neuromuscular  Overall Sensation: Patient denies any  numbness/ tingling in BUE's/ BLEs          Right Upper Extremity   Right UE ROM: Grossly WFL as observed during functional activities  Right UE Strength: Grossly WFL (at least 3+/5) as observed during functional activities  Right UE Muscle Tone: Normal  Right Hand Function: Grossly WFL as observed during functional activity         Left Upper Extremity  Left UE ROM: Grossly WFL as observed during functional activities  Left UE Strength: Grossly WFL (at least 3+/5) as observed during functional activities  Left UE Muscle Tone: Normal  Left UE Hand Function: Grossly WFL as observed during functional activites         Functional Mobility   Bed Mobility   Rolling: Maximum  assistance;cues for log rolling;use of handrail;increased time to complete task;towards the left;of 2 people  Supine to Sit: Maximum assistance;of 2 people;towards the left;use of handrail;head of bed flat;increased time to complete task (OT assisted pt's trunk from anterior)  Sit to Supine: Maximum assistance;of 2 people;towards the right;increased time to complete task;head of bed flat (OT assisted pt's BLE)  Unable to progress further due to: fatigue, SOB, and wheezing throughout, vitals stable througout  Balance  Sitting - Static: Minimal Assistance (progressing to SBA)  Sitting-Dynamic: Stand by assistance (SBA scooting up to [facility] while at edge of bed)    ADL  Lower Body Dressing: Total assistance  Lower Body Dressing Deficit: Set-up;Don/doff R sock;Don/doff L sock  Location Assessed LE Dressing: Supine in bed  Toileting: Stand-by assistance  Toileting Deficit: Use of bedpan/urinal setup  Location Assessed Toileting: Supine in bed  Toileting Deficit Additional Comments: Pt requested to use urinal due to urgency         Position after Treatment and Safety Handoff  Position after therapy session: Bed  Details: RN notified;Call light/ needs within reach  Alarms: Bed  Alarms Status: Activated and Interfaced with call system    Patient Education  Pt  educated on role of OT, POC, and discharge recommendations. Time also spent reviewing pt's spinal precautions. Pt verbalized understanding of education provided.     Tentative Discharge Summary:  0 out of 6 goals met.   Goals not met due to brevity of treatment.  Recommend d/c to SNF.  Tentative d/c from acute OT.      Time  Start Time: 1638  Stop Time: 1707  Time Calculation (min): 29 min    Charges  $OT Evaluation Mod Complex 45 Min: 1 Procedure      CPT codes  16109- Moderate Complexity Evaluation  This evaluation code was determined based on analysis of pt's occupational profile, performance deficits, and level of clinical decision-making to complete OT evaluation    **Occupational Profile:  Mod Complexity - expanded chart review    **Performance deficits:   High Complexity- More than 5 performance deficits    **Clinical Decision-making:   Mod Complexity    Co-morbidities affecting pt's current performance: See above for PMH    Current level of physical/ verbal assistance:   Max physical/verbal assistance           Glade Stanford, OTR/L  03/28/2021

## 2021-03-28 NOTE — Unmapped (Signed)
Physical Therapy  Initial Assessment and Tentative Discharge     Name: [name]  DOB: 1940-10-06  Attending Physician: Orinda Kenner, DO  Admission Diagnosis: Tachypnea [R06.82]  Malaise and fatigue [R53.81, R53.83]  COVID-19 [U07.1]  Date: 03/28/2021  Reviewed Pertinent hospital course: Yes  Hospital Course PT/OT: [name] is an 80 y.o. man who presented with severe weakness. Dx: Covid-19  Relevant PMH : L3-4 laminectomy and synovial cyst resection 03/23/21; afib  Precautions: Covid-19, MRSA, spinal precautions, fall risk, & full code  Activity Level: Activity as tolerated  Assessment  PT 6 Clicks  Help From Another Person Turning From Back to Side While Flat in Bed Without Using Siderails: Total  Help From Another Person Moving From Lying On Back To Sitting Without Using Siderails: Total  Help From Another Person Moving To And From Bed To Chair: Total  Help From Another Person Standing Up From Chair Using Your Arms: Total  Help From Another Person To Walk In Hospital Room: Total  Help From Another Person Climbing 3-5 Steps With A Railing: Total  PT 6 Clicks Score: 6  Assessment: Impaired Bed Mobility, Impaired Transfers, Impaired Gait, Impaired Balance, Impaired Strength, Impaired ROM, Impaired Safety Awareness, Impaired Stair Negotiation, Impaired Activity Tolerance, Deconditioning     Co-treatment indicated: secondary to anticipated level of skilled assistance required to safely treat and mobilize patient  Prognosis: Good    Goals   Collaborated with: Patient  Goals to be met by: 04/04/21  Patient will transition from supine to sit: Minimal assistance, head of bed flat, with bedrails (using log roll technique)  Patient will transition from sit to supine: head of bed flat, with bedrails, Minimal assistance (using log roll technique)  Patient will transfer from sit to stand: Minimal assistance, up to assistive device (RW while maintaining spinal precautions)  Patient will transfer bed/chair:  Minimal assistance, via stand step (with RW while maintaining spinal precautions)  Patient will ambulate: Minimal assistance, distance (in feet), with assistive device   Ambulation Asssistance Device: Rolling walker  Distance (in feet): 35 ft   Recommendation  Plan  Treatment/Interventions: LE strengthening/ROM, Endurance training, Patient/family training, Gait training, Compensatory technique education, Neuromuscular Reeducation, Therapeutic Activity, Therapeutic Exercise  PT Frequency: minimum 4x/week    Recommendation  Recommendation: Short-term skilled PT  Equipment Recommended: Defer to facility to obtain  Problem List  Patient Active Problem List   Diagnosis   ??? Other, multiple, and unspecified sites, insect bite, nonvenomous, without mention of infection(919.4)   ??? Hemangioma of intra-abdominal structures   ??? Other specified disorders of liver   ??? 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 Disorder of esophagus   ??? Chest pain   ??? Contact dermatitis and other eczema, due to unspecified cause   ??? Morbid obesity (CMS Dx)   ??? Obstructive sleep apnea   ??? Hypothyroidism   ??? Type II or unspecified type diabetes mellitus without mention of complication, not stated as uncontrolled (CMS Dx)   ??? Other and unspecified hyperlipidemia   ??? Essential hypertension   ??? Atrial fibrillation (CMS Dx)   ??? Congestive heart failure (CMS Dx)   ??? Hemorrhoids   ??? Esophageal reflux   ??? Pain in limb   ??? Special screening for malignant neoplasm of prostate   ??? Multiple rib fractures   ??? OSA (obstructive sleep apnea)      Past Medical History  Past Medical History:   Diagnosis Date   ??? Atrial fibrillation (  CMS Dx)     corrected with Mini-maze procedure   ??? Cataract     left eye   ??? Diabetes mellitus (CMS Dx)     type 2   ??? Dilated cardiomyopathy (CMS Dx)    ??? Hearing loss     left ear   ??? Hypercholesteremia    ??? Hypertension    ??? Sleep apnea    ??? Thyroid disease     hypothyroidism      Past Surgical History  Past Surgical History:    Procedure Laterality Date   ??? COLONOSCOPY W/ POLYPECTOMY      benign  has at 3 plus procedures   ??? HERNIA REPAIR  9/09    right inguinal, mini maze   ??? HERNIA REPAIR  2/10    herniated lungs bilateral (repaired with mesh)   ??? mini-maze      corrected his A-fib   ??? rt eye  02/2018   ??? SEPTOPLASTY     ??? UVULOPALATOPHARYNGOPLASTY     ??? UVULOPALATOPHARYNGOPLASTY      has had esophageal dilations in the past (Dr.Decter)   ??? WISDOM TOOTH EXTRACTION       Home Living/Prior Function  Patient able to provide accurate information at this time: Yes  Lives With: Spouse (wife has dementia, pt is caregiver, niece is staying with wife)  Assistance available: 24 hour assistance (niece)  Type of Home: House  Home Entry: 1 step to enter  Home Layout: Multi-level;Able to live on main level with bedroom/bathroom  Stairs within: yes but pt sleeps on main level, uses BSC  Bathroom Shower/Tub: Pension scheme manager: Raised  Bathroom Equipment: Grab bars in shower;Hand-held shower  Home Equipment: Agricultural consultant;Bedside commode;Wheelchair-manual;Lift chair (wife has hospital bed)  Additional Comments: pt sleeps in reclining lift chair  Prior Function  Functional Mobility: Independent ( no assistive device)  Uses Assistive Device: Rolling walker (since surgery, no AD prior to sx)  Receives Help From: None needed prior to admission  ADL Assistance: Independent  IADL Assistance: Independent  Vocation: Retired     Pain  Pain Score: 0 - No Pain  Therapist reported pain to: RN monitoring    Vision  Vision/Perception  Overall Vision/ Perception: Impaired  Impairments: Basic Vision  Current Vision: Wears glasses only for reading     Cognition  Overall Cognitive Status: Within Functional Limits  Cognitive Assessment: Arousal/ Alertness;Orientation Level;Behavior;Following Commands;Safety Judgment;Insight;Memory  Arousal/Alertness: Alert  Orientation Level: Oriented X4  Behavior: Appropriate;Cooperative  Following Commands: Follows  multistep commands;Requires verbal cues  Safety Judgment: Good awareness of safety precautions  Insight: Demonstrated intact insight into limitation and abilities to complete ADL's safely    Neuromuscular  Overall Sensation: Patient denies any numbness/ tingling in BUE's/ BLEs      Upper Extremity  UE Assessment: Defer to OT evaluation for formal assessment    Lower Extremity  Lower Extremity  LE Assessment: Strength WFL (at least 3+/5) as observed during functional activity;ROM grossly [facility];Tone normal      Functional Mobility  Bed Mobility   Rolling: Maximum assistance;cues for log rolling;use of handrail;increased time to complete task;towards the left;of 2 people  Supine to Sit: Maximum assistance;of 2 people;towards the left;use of handrail;head of bed flat;increased time to complete task  Sit to Supine: Maximum assistance;of 2 people;towards the right;increased time to complete task;head of bed flat  Unable to progress further due to: fatigue, SOB, and wheezing throughout, vitals stable througout  Balance  Sitting - Static:  Minimal Assistance (progressing to SBA)  Sitting-Dynamic: Stand by assistance (SBA scooting up to [facility] while at edge of bed)     Position after Therapy and Safety Handoff  Position after treatment and safety handoff  Position after therapy session: Bed  Details: RN notified;Call light/ needs within reach  Alarms: Bed  Alarms Status: Activated and Interfaced with call system      Patient Education    PT provided education on benefits of skilled physical therapy and risks of immobility.  PT educated patient on need for assistance with all mobility and out of bed activity. Patient verbalized good understanding.  Pt also educated regarding spinal precautions during mobility, verbalized understanding but unable to maintain.  Today's Assessment - Tentative D/C Summary: Pt has  good potential to progress toward the set goals (see goals section for details). Pt may be D/C'd prior to the next  treatment. If so this will serve as the D/C Summary. Pt will continue to require and benefit from skilled PT to meet the set goals safely, and to increase overall mobility.  0/5 goals met due to brevity of acute care stay   Time  Start Time: 1638  Stop Time: 1707  Time Calculation (min): 29 min    Charges   $PT Evaluation Mod Complex 30 Min: 1 Procedure      CPT codes  A. Personal factors and/or Comorbidities / Patient History that impacts plan of care:  See above PMH / PSH / and problem list.   Moderate Complexity: 1-2       B. An examination of body systems(s) musculoskeletal, neuromuscular, cardiovascular/pumonary, integumentary using standardized tests and measures:  Refer to above examination for details.     Moderate Complexity: 3 or more elements    C. A clinical presentation with:   Moderate Complexity: Evolving with Changing Characteristics    D. Clinical decision making using standardized patient assessment instrument and/or measurable assessment of functional outcome.   Moderate Complexity 97162      Jimmy Picket, PT  Physical Therapy License #[id number]  03/28/2021

## 2021-03-28 NOTE — Unmapped (Signed)
Patient admitted from ED for hypoxia and positive for COVID. Patient alert and oriented, currently on room air with saturations at 96 %. Denies pain/discomfort. Pt recently had a Lami to L3-4 and a cyst resection. Dry dressing noted to lumbar region of back. Discussed POC with patient and MD at bedside and also discussed POC. No needs voiced at this time. Telemetry and Spo2 applied to patient, patient noted A-fib which is norm for him and usually takes Eliquis and Asa daily which both are on hold until Monday. MD aware and stated she will notify Pharm to restart for Monday if patient still admitted.

## 2021-03-29 LAB — BASIC METABOLIC PANEL
Anion Gap: 5 mmol/L (ref 3–16)
Anion Gap: 5 mmol/L (ref 3–16)
BUN: 18 mg/dL (ref 7–25)
BUN: 18 mg/dL (ref 7–25)
CO2: 29 mmol/L (ref 21–33)
CO2: 29 mmol/L (ref 21–33)
Calcium: 7.9 mg/dL (ref 8.6–10.3)
Calcium: 7.9 mg/dL — ABNORMAL LOW (ref 8.6–10.3)
Chloride: 100 mmol/L (ref 98–110)
Chloride: 100 mmol/L (ref 98–110)
Creatinine: 0.94 mg/dL (ref 0.60–1.30)
Creatinine: 0.94 mg/dL (ref 0.60–1.30)
EGFR: 82
GFR, Estimated: 82
Glucose: 210 mg/dL (ref 70–100)
Glucose: 210 mg/dL — ABNORMAL HIGH (ref 70–100)
Osmolality Calc: 286 mOsm/kg (ref 278–305)
Osmolality, Calculated: 286 mOsm/kg (ref 278–305)
Potassium: 4.1 mmol/L (ref 3.5–5.3)
Potassium: 4.1 mmol/L (ref 3.5–5.3)
Sodium: 134 mmol/L (ref 133–146)
Sodium: 134 mmol/L (ref 133–146)

## 2021-03-29 LAB — CBC
Hematocrit: 31.9 % (ref 38.5–50.0)
Hematocrit: 31.9 % — ABNORMAL LOW (ref 38.5–50.0)
Hemoglobin: 10.6 g/dL (ref 13.2–17.1)
Hemoglobin: 10.6 g/dL — ABNORMAL LOW (ref 13.2–17.1)
MCH: 27.7 pg (ref 27.0–33.0)
MCH: 27.7 pg (ref 27.0–33.0)
MCHC: 33.2 g/dL (ref 32.0–36.0)
MCHC: 33.2 g/dL (ref 32.0–36.0)
MCV: 83.4 fL (ref 80.0–100.0)
MCV: 83.4 fL (ref 80.0–100.0)
MPV: 7 fL (ref 7.5–11.5)
MPV: 7 fL — ABNORMAL LOW (ref 7.5–11.5)
Platelets: 172 10*3/uL (ref 140–400)
Platelets: 172 10*3/uL (ref 140–400)
RBC: 3.82 10*6/uL (ref 4.20–5.80)
RBC: 3.82 10*6/uL — ABNORMAL LOW (ref 4.20–5.80)
RDW: 15.8 % (ref 11.0–15.0)
RDW: 15.8 % — ABNORMAL HIGH (ref 11.0–15.0)
WBC: 9.6 10*3/uL (ref 3.8–10.8)
WBC: 9.6 10*3/uL (ref 3.8–10.8)

## 2021-03-29 LAB — POCT GLUCOSE MONITORING DEVICE
Glucose: 286 mg/dL — ABNORMAL HIGH (ref 70–100)
Glucose: 227 mg/dL — ABNORMAL HIGH (ref 70–100)
Glucose: 229 mg/dL — ABNORMAL HIGH (ref 70–100)
Glucose: 274 mg/dL — ABNORMAL HIGH (ref 70–100)

## 2021-03-29 LAB — POC GLU MONITORING DEVICE
POC Glucose Monitoring Device: 286 mg/dL (ref 70–100)
POC Glucose Monitoring Device: 227 mg/dL (ref 70–100)
POC Glucose Monitoring Device: 229 mg/dL (ref 70–100)
POC Glucose Monitoring Device: 274 mg/dL — ABNORMAL HIGH (ref 70–100)

## 2021-03-29 MED FILL — PANTOPRAZOLE 40 MG TABLET,DELAYED RELEASE: 40 40 MG | ORAL | Qty: 1

## 2021-03-29 MED FILL — TYLENOL 325 MG TABLET: 325 325 mg | ORAL | Qty: 2

## 2021-03-29 MED FILL — CARVEDILOL 25 MG TABLET: 25 25 MG | ORAL | Qty: 1

## 2021-03-29 MED FILL — ELIQUIS 5 MG TABLET: 5 5 mg | ORAL | Qty: 1

## 2021-03-29 MED FILL — SODIUM CHLORIDE 0.9 % INTRAVENOUS SOLUTION: 75.00 75.00 mL/hr | INTRAVENOUS | Qty: 1000

## 2021-03-29 MED FILL — ASPIRIN 81 MG TABLET,DELAYED RELEASE: 81 81 MG | ORAL | Qty: 1

## 2021-03-29 MED FILL — ATORVASTATIN 10 MG TABLET: 10 10 MG | ORAL | Qty: 1

## 2021-03-29 MED FILL — FERROUS SULFATE 325 MG (65 MG IRON) TABLET: 325 325 (65 FE) MG | ORAL | Qty: 1

## 2021-03-29 MED FILL — VITAMIN C 500 MG TABLET: 500 500 MG | ORAL | Qty: 2

## 2021-03-29 MED FILL — PROPAFENONE 150 MG TABLET: 150 150 MG | ORAL | Qty: 2

## 2021-03-29 MED FILL — VALSARTAN 160 MG TABLET: 160 160 MG | ORAL | Qty: 2

## 2021-03-29 MED FILL — REMDESIVIR 100 MG INTRAVENOUS POWDER FOR SOLUTION: 100 100 mg | INTRAVENOUS | Qty: 1

## 2021-03-29 MED FILL — ZINC SULFATE 50 MG ZINC (220 MG) CAPSULE: 50 50 mg zinc (220 mg) | ORAL | Qty: 1

## 2021-03-29 MED FILL — SYNTHROID 100 MCG TABLET: 100 100 mcg | ORAL | Qty: 1

## 2021-03-29 MED FILL — LANTUS U-100 INSULIN 100 UNIT/ML SUBCUTANEOUS SOLUTION: 100 100 unit/mL | SUBCUTANEOUS | Qty: 0.05

## 2021-03-29 NOTE — Unmapped (Signed)
Problem: Glucose Imbalance related to diabetes disease process  Goal: Clinical indication of glucose balance is achieved  Outcome: Progressing  Goal: Patient's discharge needs are met  Outcome: Progressing     Problem: Knowledge deficit related to self-management of chronic disease  Goal: Patient/family/caregiver demonstrates understanding of disease process, treatment plan, medications, and discharge instructions  Outcome: Progressing     Problem: Potential for imbalanced nutrition related to metabolic effect of diabetes  Goal: Patient's nutritional needs will be met  Outcome: Progressing     Problem: Risk for ineffective therapeutic regimen management related to insulin pump  Goal: Blood glucose is within target range  Outcome: Progressing     Problem: Patient will remain free of injury d/t fall  Goal: High Fall Risk Precautions  Outcome: Progressing  Goal: Additional High Fall Risk Precautions (consider the following)  Outcome: Progressing     Problem: Acute Pain  Description: Patient's pain progressing toward patient's stated pain goal  Goal: Patient displays improved well-being such as baseline levels for pulse, BP, respirations and relaxed muscle tone or body posture  Outcome: Progressing  Goal: Patient will manage pain with the appropriate technique/intervention  Description: Assess and monitor patient's pain using appropriate pain scale. Collaborate with interdisciplinary team and initiate plan and interventions as ordered.  Re-assess patient's pain level 30-60 minutes after pain management intervention.  Outcome: Progressing  Goal: Patient will reduce or eliminate use of analgesics  Outcome: Progressing  Goal: Patients pain is managed to allow active participation in daily activities  Outcome: Progressing  Goal: Patient verbalizes a reduction in pain level  Outcome: Progressing     Problem: Pain  Goal: Patient's pain is progressing toward patient's stated pain goal  Description: Assess and monitor  patient's pain using appropriate pain scale. Collaborate with interdisciplinary team and initiate plan and interventions as ordered. Re-assess patient's pain level 30 - 60 minutes after pain management intervention.   Outcome: Progressing     Problem: Safety  Goal: Patient will be injury free during hospitalization  Description: Assess and monitor vitals signs, neurological status including level of consciousness and orientation. Assess patient's risk for falls and implement fall prevention plan of care and interventions per hospital policy.      Ensure arm band on, uncluttered walking paths in room, adequate room lighting, call light and overbed table within reach, bed in low position, wheels locked, side rails up per policy, and non-skid footwear provided.   Outcome: Progressing  Goal: Patient with weight > 350lbs will have appropriate equipment  Description: Consider ordering Bariatric Bed, Chair and Bedside Commode for patient weight > 350 lbs.  Outcome: Progressing     Problem: Patient will remain free of falls  Goal: Universal Fall Precautions  Outcome: Progressing     Problem: Daily Care  Goal: Daily care needs are met  Description: Assess and monitor ability to perform self care and identify potential discharge needs.  Outcome: Progressing     Problem: Psychosocial Needs  Goal: Demonstrates ability to cope with hospitalization/illness  Description: Assess and monitor patients ability to cope with his/her illness.  Outcome: Progressing  Goal: Collaborate with patient/family to identify patient's goals  Outcome: Progressing     Problem: Discharge Barriers  Goal: Patient's discharge needs are met  Description: Collaborate with interdisciplinary team and initiate plans and interventions as needed.   Outcome: Progressing

## 2021-03-29 NOTE — Unmapped (Addendum)
[facility]  Care Management/Social Work Assessment      Patient Information     Patient Name: [name]  MRN: [id number]  Hospital Day: 0  Inpatient/Observation: Observation  Admit Date: 03/27/2021  Admission Diagnosis: Tachypnea [R06.82]  Malaise and fatigue [R53.81, R53.83]  COVID-19 [U07.1]  Attending provider: Bunnie Philips, MD    PCP: Marcello Fennel, MD  Home Pharmacy:              [facility] [id number] - [address] [place], [place] - [address] AT [facility] [address]  [address]  [place] [address]  Phone: [phone]     [facility]) - [place], [place] - [address] [facility] [address] [facility]  [address] [place]  [address]  [place] [address]  Phone: [phone]      Issues related to obtaining medications: none    Payor Information   Medical Insurance Coverage:  Payor: [employer] MANAGED MEDICARE / Plan: BLUE MEDICARE ADVANTAGE / Product Type: Medicare Mngd Care /   Secondary Payor: na    Functional Assessment   Functional Assessment  Assessment Information Obtained From:: Patient, Chart Review  Current Mental Status: Awake, Oriented to Person, Oriented to Place, Oriented to Time, Oriented to Situation  Activities of Daily Living: Independent  ADL Comments: typically cares for his wife  Work History: Retired  Marital Status: Married  Demographics Correct:: Yes  Discharge Destination: Other (Comment) (TBD)    Current Living Arrangements   Current Living Arrangements  Current Living Arrangements: Home  Type of Housing: House  Who do you live with?: With Family  What family member?: spouse (patient is the caregiver)  One Story or Two (check all that apply): Multi-Level, Live on first floor, Bedroom on first floor, [place] on first floor  Enter the number of steps and rails to enter the residence: 1  History of Falls?: No    Electrical engineer at Home: DME  DME Current: Teacher, adult education Systems   Emergency contact: Extended  Emergency Contact Information  Primary Emergency Contact: Josem Kaufmann  Address: [address] [place], [place] [address] [place]  Home Phone: [phone]  Relation: Spouse  Preferred language: English  Interpreter needed? No    Support Systems  Times of available support: Total 24/7 hands on (add comment) (niece is able to provide if needed)  Marital Status: Married  Demographics Correct:: Yes  Discharge Destination: Other (Comment) (TBD)  Next of Kin: Josem Kaufmann  Next of Kin Relationship:  Spouse  Next of Kin Phone Number: [phone]  Assessment Information Obtained From:: Patient, Chart Review    Other Pertinent Information     Patient is from home and typically independent.  Patient does feel week but is unsure about rehab at [place].  CM offered to meet with patient again after therapy sees him today to further discuss.  Patient's spouse is in [facility] as respite care while patient is in hospital.    1430 CM met with patient and he is agreeable to IPR referral to [facility] (PT/OT rec's have changed); [facility] is back up if [facility] unable to accept.    1545 CM spoke with [place] at [facility], patient does not have OON benefits and [facility] is OON; would need 3-4 denials from other IPRs.  CM provided patient with list of IPR from medicare.gov and  explained the situation.  Patient would prefer just to go to [facility] and get rehab started.  CM updated Sadie at [employer], patient's spouse tested positive at [facility] where she is getting respite care and they are able to accept patient at the same facility and share the room with spouse while doing rehab.  Met with patient and he is agreeable.  Sadie/Nicole will start pre-cert for [facility] and anticipated admission tomorrow 12/20.      Advance Directives (For Healthcare)  Advance Directive: Patient has advance directive, copy not in chart  Advance Directive not in Chart: Copy requested from other (Comment)  Requested From:  (Individual/Organization's Name): Patient  Relationship to Patient:: self  Healthcare Agent Appointed: No  Pre-existing DNR/DNI Order: No  Patient Requests Assistance: No    Discharge Plan     Met with patient to initiate discussion regarding discharge planning. Introduced self and role of case management/social work and provided Tour manager.       Anticipated Discharge Plan: [facility], pre-cert needed    Anticipated Discharge Date: 03/30/2021    Anticipated Transportation: stretcher?    Patient/Family aware and taking part in the discharge plan.  Patient/family educated that once post-acute care needs have been identified, a provider list applicable to the identified post-acute care needs as well as the insurance provider will be provided, and patient/family have the freedom to choose their provider(s); financial interest(s) are disclosed as appropriate.         Johny Drilling, RN  Phone Number: [phone]

## 2021-03-29 NOTE — Unmapped (Signed)
[facility]ist [facility]  Progress Note    Admit Date: 03/27/2021    Patient: [name]  Date of service: 03/29/2021    Chief complaint, reason for follow up:     [name] is a 80 y.o. male on hospital day 0.  The principal reason for today's follow up visit is COVID.       Subjective:     I personally reviewed vitals, labs, staff/progress notes.    Interval History:     Continues to feel generally weak. Some nonproductive cough. No chest pain.     Lives with wife and niece. His niece typically cares for his wife who has Alzheimers, although wife is in respite care this week. Aware that he will likely need rehab at d/c.    Brief ROS:   General: No fever or chills, generally weak  ENT: no sore throat or nasal congestion  Cardiac: No chest pain or palpitations  Pulm: No wheeze  GI: No abdominal pain, N/V  Musculoskeletal: No leg edema or muscle pain  Neuro: No dizziness/lightheadedness or HA  GU: no dysuria or hematuria  Derm: no new rash, no skin lesions  Psych: no anxiety, no depression    Physical Exam:   BP 155/76 (BP Location: Left arm, Patient Position: Lying)    Pulse 72    Temp 98.2 ??F (36.8 ??C) (Oral)    Resp 18    Ht 6' (1.829 m)    Wt (!) 260 lb 2.3 oz (118 kg)    SpO2 92%    BMI 35.28 kg/m??   O2 Flow Rate (L/min): 2 L/min       Intake/Output Summary (Last 24 hours) at 03/29/2021 0957  Last data filed at 03/29/2021 0600  Gross per 24 hour   Intake 600 ml   Output 1200 ml   Net -600 ml       General Appearance:  Alert, cooperative, no distress   Montoya:  Normocephalic, without obvious abnormality, atraumatic    Eyes:  EOMI, sclera nonicteric   Neck:  Supple, trachea midline   Lungs:  Clear to auscultation bilaterally, respirations unlabored, no wheezes, rales or rhonchi    Heart:  Regular rate and rhythm, S1 and S2 normal, no murmur   Abdomen:  Soft, non-tender, bowel sounds active all four quadrants   Extremities:  Extremities normal, no edema   Pulses:  2+ and symmetric    Skin:  Warm  and dry   Neurologic:  Face symmetric, speech fluent, no tremor       Current Meds:     Current Facility-Administered Medications   Medication Dose Frequency Provider Last Admin   ??? acetaminophen  650 mg Q4H PRN Brian Clymer, DO 650 mg at 03/29/21 0100   ??? apixaban  5 mg BID Johann Capers, MD 5 mg at 03/29/21 1478   ??? ascorbic acid (vitamin C)  1,000 mg BID Brian Clymer, DO 1,000 mg at 03/29/21 2956   ??? aspirin  81 mg Daily with breakfast Johann Capers, MD 81 mg at 03/29/21 0943   ??? atorvastatin  10 mg Nightly (2100) Brian Clymer, DO 10 mg at 03/28/21 2119   ??? bisacodyL  10 mg Daily PRN Johann Capers, MD     ??? carvediloL  25 mg BID Brian Clymer, DO 25 mg at 03/29/21 0943   ??? dextrose 50 % in water (D50W)  25 mL Q15 Min PRN Arlys John Clymer, DO      Or   ???  dextrose 50 % in water (D50W)  50 mL Q15 Min PRN Arlys John Clymer, DO     ??? ferrous sulfate  325 mg Daily with breakfast Brian Clymer, DO 325 mg at 03/28/21 1232   ??? glucose  12 g Q15 Min PRN Arlys John Clymer, DO     ??? insulin glargine  5 Units Nightly (2100) Orinda Kenner, DO 5 Units at 03/28/21 2228   ??? insulin lispro  0-10 Units Nightly (2100) Ezzard Standing, DO 5 Units at 03/28/21 2228   ??? insulin lispro  0-12 Units TID AC Brian Clymer, DO 7 Units at 03/28/21 1821   ??? levothyroxine  100 mcg QAM Brian Clymer, DO 100 mcg at 03/29/21 1610   ??? ondansetron  4 mg Q8H PRN Brian Clymer, DO     ??? pantoprazole  40 mg DAILY 0600 Brian Clymer, DO 40 mg at 03/29/21 0544   ??? polyethylene glycol  17 g TID PRN Johann Capers, MD     ??? propafenone  225 mg TID Arlys John Clymer, DO 225 mg at 03/29/21 0943   ??? remdesivir in sodium chloride 0.9% 250 mL IV infusion  100 mg Daily Johann Capers, MD 100 mg at 03/29/21 0947   ??? sodium chloride 0.9 %  75 mL/hr Continuous Brian Clymer, DO 75 mL/hr at 03/29/21 9604   ??? sodium chloride 0.9%  30 mL PRN Johann Capers, MD     ??? tamsulosin  0.4 mg Nightly (2100) Arlys John Clymer, DO 0.4 mg at 03/28/21 2119   ??? valsartan  320 mg Daily 0900 Johann Capers, MD 320 mg at 03/29/21  0942   ??? zinc sulfate  220 mg Daily 0900 [facility], DO 220 mg at 03/29/21 5409        Labs:     Recent Labs     03/27/21  1340 03/28/21  0506 03/29/21  0536   WBC 15.1* 11.7* 9.6   HGB 11.4* 10.7* 10.6*   HCT 34.9* 32.6* 31.9*   PLT 186 178 172                                                                  Recent Labs     03/27/21  1340 03/28/21  0506 03/29/21  0536   NA 132* 132* 134   K 4.9 4.2 4.1   CL 97* 97* 100   CO2 28 27 29    BUN 22 21 18    CREATININE 1.13 1.12 0.94   GLUCOSE 247* 224* 210*     No results for input(s): INR in the last 72 hours.              Component Value Date/Time    POCGMD 227 (H) 03/29/2021 0313    POCGMD 286 (H) 03/28/2021 2226    POCGMD 278 (H) 03/28/2021 1810    POCGMD 300 (H) 03/28/2021 1235    POCGMD 249 (H) 03/28/2021 0901        Assessment and Plan:   [name] is a 80 y.o. male      On hospital day 0.   Current problems include:    Covid   On remdesivir x 3 days  Supportive care  Up in chair today  IS    Weakness  Secondary to covid  PT/OT evaluation    S/p decompression of the lumbar spine 03/23/21  Synovial cyst removed  Eliquis and Asprin can be resumed today    CAD  Cardiac diet    HTN  Continue coreg and valsartan    Cardiomyopathy  EF 55-60%, abnormal relaxation    Hx atrial fibrillation  Eliquis resumed today    OSA  Uses CPAP    DM2, uncontrolled  Lantus 5 units with SSI  Lab Results   Component Value Date    HGBA1C 8.7 (H) 03/28/2021     BPH  Continue flomax    Constipation    DVT prophylaxis  Eliquis resumed today    Disposition:     D/c once short-term PT is arranged     Electronically signed,  Kathrin Greathouse, MD  Pager: [phone]  03/29/2021  9:57 AM  [facility]

## 2021-03-29 NOTE — Unmapped (Signed)
Problem: Knowledge deficit related to self-management of chronic disease  Goal: Patient/family/caregiver demonstrates understanding of disease process, treatment plan, medications, and discharge instructions  Outcome: Progressing     Problem: Patient will remain free of injury d/t fall  Goal: High Fall Risk Precautions  Outcome: Progressing     Problem: Acute Pain  Description: Patient's pain progressing toward patient's stated pain goal  Goal: Patient will reduce or eliminate use of analgesics  Outcome: Progressing     Problem: Safety  Goal: Patient will be injury free during hospitalization  Description: Assess and monitor vitals signs, neurological status including level of consciousness and orientation. Assess patient's risk for falls and implement fall prevention plan of care and interventions per hospital policy.      Ensure arm band on, uncluttered walking paths in room, adequate room lighting, call light and overbed table within reach, bed in low position, wheels locked, side rails up per policy, and non-skid footwear provided.   Outcome: Progressing     Problem: Psychosocial Needs  Goal: Demonstrates ability to cope with hospitalization/illness  Description: Assess and monitor patients ability to cope with his/her illness.  Outcome: Progressing

## 2021-03-29 NOTE — Unmapped (Signed)
Occupational Therapy  Treatment, Co-treatment and Tentative Discharge     Name: [name]  DOB: 11/25/1940  Attending Physician: Bunnie Philips, MD  Admission Diagnosis: Tachypnea [R06.82]  Malaise and fatigue [R53.81, R53.83]  COVID-19 [U07.1]  Date: 03/29/2021  Reviewed Pertinent hospital course: Yes   Hospital Course PT/OT: [name] is an 80 y.o. man who presented with severe weakness. Dx: Covid-19  Relevant PMH : L3-4 laminectomy and synovial cyst resection 03/23/21; afib  Precautions: Covid-19, MRSA, spinal precautions, fall risk, & full code  Activity Level: Activity as tolerated      Assessment- revised 03/29/21  OT 6 Clicks  Help From Another Person To Put On/Take Off Regular Lower Body Clothing: Total  Help From Another Person Bathing ( including washing ,rinsing,drying): A lot  Help From Another Person Toileting, which includes using the toilet, bedpan or urinal: A lot (for bowel movement)  Help From Another Person To Put On/Take Off Regular Upper Body Clothing: A little  Help From Another Person Taking Care Of Personal Grooming: A little  Help From Another Person Eating Meals: None  OT 6 Clicks Score: 15  Assessment: Decreased activity tolerance, Decreased Functional Mobility, Decreased ADL status, Decreased Balance, Decreased self-care transfers, Decreased Safe judgment during ADL        Co-treatment indicated: secondary to anticipated level of skilled assistance required to safely treat and mobilize patient (Anticipate no further co-tx needed next session.)       Prognosis for OT goals: Good                                     Goals  Goals to be met in: 1 week, 04/04/21  Patient stated goal: to go home  Patient will complete supine to sit in prep for ADLs: Moderate assistance (goal met 03/29/21)  Patient will complete functional chair transfer: Minimal assistance (goal met 03/29/21)  Patient will complete toilet transfer: Minimal assistance (goal met 03/29/21)  Patient will complete  toileting: Minimal assistance (goal met 03/29/21)  Patient will complete grooming task: Minimal assistance (goal met 03/29/21)  Patient will complete lower body dressing: Maximum assistance (with ae as needed)  Collaborated with: Patient    Recommendation  Plan  Treatment Interventions: ADL retraining, Activity Tolerance training, Functional transfer training, Patient/Family training, Therapeutic Activity, Compensatory technique education  OT Frequency: minimum 5x/week  Recommendation  Recommendation: IP Rehab- 03/29/21 this is a change in d/c recommendations. IPR is appropriate given the advancement pt has made since yesterday's eval. By discharge from [facility], I anticipate that this patient will be able to tolerate Occupational Therapy / Rehabilitation Services 5 days / week for 3 hrs each day.   Equipment Recommendations: Defer at this time    Problem List  Patient Active Problem List   Diagnosis   ??? Other, multiple, and unspecified sites, insect bite, nonvenomous, without mention of infection(919.4)   ??? Hemangioma of intra-abdominal structures   ??? Other specified disorders of liver   ??? 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 Disorder of esophagus   ??? Chest pain   ??? Contact dermatitis and other eczema, due to unspecified cause   ??? Morbid obesity (CMS Dx)   ??? Obstructive sleep apnea   ??? Hypothyroidism   ??? Type II or unspecified type diabetes mellitus without mention of complication, not stated as uncontrolled (CMS Dx)   ??? Other  and unspecified hyperlipidemia   ??? Essential hypertension   ??? Atrial fibrillation (CMS Dx)   ??? Congestive heart failure (CMS Dx)   ??? Hemorrhoids   ??? Esophageal reflux   ??? Pain in limb   ??? Special screening for malignant neoplasm of prostate   ??? Multiple rib fractures   ??? OSA (obstructive sleep apnea)        Past Medical History  Past Medical History:   Diagnosis Date   ??? Atrial fibrillation (CMS Dx)     corrected with Mini-maze procedure   ??? Cataract     left eye    ??? Diabetes mellitus (CMS Dx)     type 2   ??? Dilated cardiomyopathy (CMS Dx)    ??? Hearing loss     left ear   ??? Hypercholesteremia    ??? Hypertension    ??? Sleep apnea    ??? Thyroid disease     hypothyroidism        Past Surgical History  Past Surgical History:   Procedure Laterality Date   ??? COLONOSCOPY W/ POLYPECTOMY      benign  has at 3 plus procedures   ??? HERNIA REPAIR  9/09    right inguinal, mini maze   ??? HERNIA REPAIR  2/10    herniated lungs bilateral (repaired with mesh)   ??? mini-maze      corrected his A-fib   ??? rt eye  02/2018   ??? SEPTOPLASTY     ??? UVULOPALATOPHARYNGOPLASTY     ??? UVULOPALATOPHARYNGOPLASTY      has had esophageal dilations in the past (Dr.Decter)   ??? WISDOM TOOTH EXTRACTION           Cognition  Overall Cognitive Status: Within Functional Limits  Cognitive Assessment: Arousal/ Alertness  Arousal/Alertness: Alert  Orientation Level: Oriented X4  Behavior: Appropriate;Cooperative  Following Commands: Follows multistep commands  Safety Judgment: Decreased awareness of safety precautions  Comments: education necessary for spinal precautions    Pain  Pain Score: 0 - No Pain (offered multiple pain assessments, 0/10 each time)  Therapist reported pain to: RN monitoring for needs.     Mobility  Bed Mobility  Rolling: Minimal assistance;of 2 people (OT assisted in rotating shoulders and hips)  Supine to Sit: Minimal assistance;of 2 people (OT assisted elevating shoulders)  Sit to Supine:  (up in chair following OT session)  Functional Transfers  Sit to Stand: Contact Guard assistance;of 2 people;from elevated surface (from elevated EOB)  Stand to Sit: Contact Guard assistance;of 2 people  Toilet Transfers: Minimal assistance;of 2 people;with assistive device;increased time to complete task;grab bar  Toilet Transfers  Assistive Device: Rolling walker  Functional Mobility: Contact guard assistance;of 2 people  Balance  Sitting - Static: Independent  Sitting - Dynamic: Modified Independent  Standing -  Static: Stand-by assistance;Contact Guard Assistance;With Assistive Device   Standing-Static Assistive Device: Rolling walker  Standing - Dynamic: Copywriter, advertising;With Assistive Device  Standing-Dynamic Assistive Device: Rolling walker        Exercises  Exercises  RUE AROM Comment: educated pt on seated UE AROM for shoulder flexion, abduction, extension, elbow flexion and extension    ADL  Grooming: Contact guard assistance  Grooming Deficit: Set-up;Verbal cueing;Supervision/safety;Increased time to complete;Wash/dry hands;Standing with assistive device  Location Assessed Grooming: Standing at sink  Toileting: Minimal assistance (for BM care thoroughness)      Position after Treatment and Safety Handoff  Position after therapy session: Chair  Details: RN notified;Call light/  needs within reach  Alarms: Chair  Alarms Status: Activated and Interfaced with call system      Patient Education  Educated pt on role of OT  Educated pt on necessity of further rehab following d/c    Handoff of Care:  Safety Handoff completed with Revonda Standard, RN after Rehabilitation session. Following session, pt up in bedside chair with all needs in reach. D/c recommendation changed from SNF to IPR this date in light of pt's progress. Pt is primary caregiver of spouse, and does agree that further OT services are appropriate. Please allow this note to serve as the D/C summary if pt D/C [facility] prior to next OT session.     Tentative Discharge Summary:  Pt expected to d/c to IPR this date.   5 out of 6 goals met.   Goals not met due to brevity of admit.  Recommend IPR.  Tentative d/c from acute OT.        Time  Start Time: 1320  Stop Time: 1345  Time Calculation (min): 25 min    Charges    $Self Care/ADL/Home Management Training: 23-37 mins    Monico Blitz, COTA/L [phone]  PRN [facility] [id number] or [id number]  03/29/2021

## 2021-03-29 NOTE — Unmapped (Signed)
Physical Therapy                                            Co-treatment and Tentative Discharge  (Co-treat with PT/OT due to skilled assist of 2 needed to safely assess and/or address OOB activity with regard to current medical status.)    Name: [name]  DOB: [birth year]  Attending Physician: Bunnie Philips, MD  Admission Diagnosis: Tachypnea [R06.82]  Malaise and fatigue [R53.81, R53.83]  COVID-19 [U07.1]  Date: 03/29/2021  Reviewed Pertinent hospital course: Yes  Hospital Course PT/OT: [name] is an 80 y.o. man who presented with severe weakness. Dx: Covid-19  Relevant PMH : L3-4 laminectomy and synovial cyst resection 03/23/21; afib  Precautions: Covid-19, MRSA, spinal precautions, fall risk, & full code  Activity Level: Activity as tolerated    Assessment  PT 6 Clicks  Help From Another Person Turning From Back to Side While Flat in Bed Without Using Siderails: A lot  Help From Another Person Moving From Lying On Back To Sitting Without Using Siderails: A lot  Help From Another Person Moving To And From Bed To Chair: A little  Help From Another Person Standing Up From Chair Using Your Arms: A little  Help From Another Person To Walk In Hospital Room: A little  Help From Another Person Climbing 3-5 Steps With A Railing: A lot  PT 6 Clicks Score: 15  Assessment: Impaired Bed Mobility, Impaired Transfers, Impaired Gait, Impaired Balance, Impaired Strength, Impaired ROM, Impaired Safety Awareness, Impaired Stair Negotiation, Impaired Activity Tolerance, Deconditioning     Co-treatment indicated: secondary to anticipated level of skilled assistance required to safely treat and mobilize patient (Anticipate no further co-tx needed next session.)  Prognosis: Good    Goals  3 out of 5 goals met.    Collaborated with: Patient  Goals to be met by: 04/04/21  Patient will transition from supine to sit: Minimal assistance, head of bed flat, with bedrails (using log roll technique)  Patient will  transition from sit to supine: head of bed flat, with bedrails, Minimal assistance (using log roll technique)  Patient will transfer from sit to stand: Minimal assistance, up to assistive device (RW while maintaining spinal precautions; GOAL MET 03/29/21)  Patient will transfer bed/chair: Minimal assistance, via stand step (with RW while maintaining spinal precautions; GOAL MET 03/29/21)  Patient will ambulate: Minimal assistance, distance (in feet), with assistive device (GOAL MET 03/29/21)   Ambulation Asssistance Device: Rolling walker  Distance (in feet): 35 ft    Recommendation  Plan  Treatment/Interventions: LE strengthening/ROM, Endurance training, Patient/family training, Gait training, Compensatory technique education, Neuromuscular Reeducation, Therapeutic Activity, Therapeutic Exercise  PT Frequency: minimum 4x/week    Recommendation  Recommendation: IP Rehab  Equipment Recommended: Defer to facility to obtain    Problem List  Patient Active Problem List   Diagnosis   ??? Other, multiple, and unspecified sites, insect bite, nonvenomous, without mention of infection(919.4)   ??? Hemangioma of intra-abdominal structures   ??? Other specified disorders of liver   ??? 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 Disorder of esophagus   ??? Chest pain   ??? Contact dermatitis and other eczema, due to unspecified cause   ??? Morbid obesity (CMS Dx)   ??? Obstructive sleep apnea   ??? Hypothyroidism   ??? Type II or unspecified  type diabetes mellitus without mention of complication, not stated as uncontrolled (CMS Dx)   ??? Other and unspecified hyperlipidemia   ??? Essential hypertension   ??? Atrial fibrillation (CMS Dx)   ??? Congestive heart failure (CMS Dx)   ??? Hemorrhoids   ??? Esophageal reflux   ??? Pain in limb   ??? Special screening for malignant neoplasm of prostate   ??? Multiple rib fractures   ??? OSA (obstructive sleep apnea)        Past Medical History  Past Medical History:   Diagnosis Date   ??? Atrial fibrillation (CMS Dx)      corrected with Mini-maze procedure   ??? Cataract     left eye   ??? Diabetes mellitus (CMS Dx)     type 2   ??? Dilated cardiomyopathy (CMS Dx)    ??? Hearing loss     left ear   ??? Hypercholesteremia    ??? Hypertension    ??? Sleep apnea    ??? Thyroid disease     hypothyroidism        Past Surgical History  Past Surgical History:   Procedure Laterality Date   ??? COLONOSCOPY W/ POLYPECTOMY      benign  has at 3 plus procedures   ??? HERNIA REPAIR  9/09    right inguinal, mini maze   ??? HERNIA REPAIR  2/10    herniated lungs bilateral (repaired with mesh)   ??? mini-maze      corrected his A-fib   ??? rt eye  02/2018   ??? SEPTOPLASTY     ??? UVULOPALATOPHARYNGOPLASTY     ??? UVULOPALATOPHARYNGOPLASTY      has had esophageal dilations in the past (Dr.Decter)   ??? WISDOM TOOTH EXTRACTION         Cognition:  Overall Cognitive Status: Impaired  Cognitive Assessment: Arousal/ Alertness;Orientation Level;Behavior;Following Commands  Arousal/Alertness: Alert  Orientation Level: Oriented X4  Behavior: Appropriate;Cooperative  Following Commands: Follows multistep commands;Requires verbal cues  Comments: Pt requires cues for spinal precautions and to maintain during functional mobility.     Pain:  Pain Score: 0 - No Pain  Therapist reported pain to: RN monitoring for needs.    Mobility:  Bed Mobility  Supine to Sit: Minimal assistance;of 2 people;increased time to complete task;head of bed flat;towards the left;use of handrail;cues for log rolling  Sit to Supine:  (Pt up in chair at end of session.)  Transfers  Sit to Stand: Minimal assistance;Contact Guard assistance;increased time to complete task;cues for hand placement (with RW; cues for hand placement; Min A from commode and chair; CGA from very elevated bed)  Stand to Sit: Contact Guard assistance;Minimal assistance;increased time to complete task;cues for hand placement (with RW; cues for hand placement and eccentric control)  Bed to Chair: Contact Guard assistance (with RW)  Gait  Distance:  15 feet + 15 feet + 40 feet  Level of Assistance: Contact Guard assistance;increased time to complete task  Assistive Device: Rolling walker  Gait Characteristics: Unsteady;swing-through pattern;decreased cadence;No LOB;Increased trunk flexion (Cues for upright posture and object negotiation.)  Balance  Sitting - Static: Modified Independent  Sitting - Dynamic: Supervision  Standing - Static: Contact Guard Assistance  Standing - Dynamic: Minimal Assistance (Min A x1-2)  Comments: Dizziness noted with initial stand, but resolved with prolonged standing, pt reporting symptoms improving increased time. Pt tolerated standing at the sink for ~2 minutes and at the chair for peri-care for ~3 minutes. Cues for spinal precautions and  positioning.    Position after Treatment and Safety Handoff  Position after therapy session: Chair  Details: RN notified;Call light/ needs within reach  Alarms: Chair  Alarms Status: Activated and Interfaced with call system    Patient Education  Pt educated on role of therapy, POC, and importance of OOB mobility. Educated on importance of seated therapeutic exercises and up to the chair with staff. Extended time educating on seated therapeutic exercises including LAQ, seated marches, hip ABD/ADD, and ankle pumps with pt completing 2x BLE of each. Pt verbalized understanding.    Time  Start Time: 1320  Stop Time: 1345  Time Calculation (min): 25 min    Charges     $Gait/Mobility: 8-22 mins  $Therapeutic Activity: 1 unit    If the pt is discharged from [facility] prior to the next PT session, this note will serve as the PT Discharge Summary with the recommendations as outlined above. Updated goals are above. If the pt remains here at [facility] then PT will continue per POC.    Goals not met due to:  1. Prematurity of the time frame set.  2. Brevity of treatment interventions.  3. Severity of Weakness.  4. Slow Medical Status progress.    Ellison Hughs, PT, DPT  [place]  03/29/2021

## 2021-03-30 LAB — CBC
Hematocrit: 33.5 % (ref 38.5–50.0)
Hematocrit: 33.5 % — ABNORMAL LOW (ref 38.5–50.0)
Hemoglobin: 11.1 g/dL (ref 13.2–17.1)
Hemoglobin: 11.1 g/dL — ABNORMAL LOW (ref 13.2–17.1)
MCH: 27.7 pg (ref 27.0–33.0)
MCH: 27.7 pg (ref 27.0–33.0)
MCHC: 33.2 g/dL (ref 32.0–36.0)
MCHC: 33.2 g/dL (ref 32.0–36.0)
MCV: 83.3 fL (ref 80.0–100.0)
MCV: 83.3 fL (ref 80.0–100.0)
MPV: 6.8 fL (ref 7.5–11.5)
MPV: 6.8 fL — ABNORMAL LOW (ref 7.5–11.5)
Platelets: 179 10*3/uL (ref 140–400)
Platelets: 179 10*3/uL (ref 140–400)
RBC: 4.02 10*6/uL (ref 4.20–5.80)
RBC: 4.02 10*6/uL — ABNORMAL LOW (ref 4.20–5.80)
RDW: 15.8 % (ref 11.0–15.0)
RDW: 15.8 % — ABNORMAL HIGH (ref 11.0–15.0)
WBC: 9.9 10*3/uL (ref 3.8–10.8)
WBC: 9.9 10*3/uL (ref 3.8–10.8)

## 2021-03-30 LAB — BASIC METABOLIC PANEL
Anion Gap: 7 mmol/L (ref 3–16)
Anion Gap: 7 mmol/L (ref 3–16)
BUN: 18 mg/dL (ref 7–25)
BUN: 18 mg/dL (ref 7–25)
CO2: 27 mmol/L (ref 21–33)
CO2: 27 mmol/L (ref 21–33)
Calcium: 7.7 mg/dL (ref 8.6–10.3)
Calcium: 7.7 mg/dL — ABNORMAL LOW (ref 8.6–10.3)
Chloride: 101 mmol/L (ref 98–110)
Chloride: 101 mmol/L (ref 98–110)
Creatinine: 0.8 mg/dL (ref 0.60–1.30)
Creatinine: 0.8 mg/dL (ref 0.60–1.30)
EGFR: 89
GFR, Estimated: 89
Glucose: 139 mg/dL (ref 70–100)
Glucose: 139 mg/dL — ABNORMAL HIGH (ref 70–100)
Osmolality Calc: 284 mOsm/kg (ref 278–305)
Osmolality, Calculated: 284 mOsm/kg (ref 278–305)
Potassium: 3.9 mmol/L (ref 3.5–5.3)
Potassium: 3.9 mmol/L (ref 3.5–5.3)
Sodium: 135 mmol/L (ref 133–146)
Sodium: 135 mmol/L (ref 133–146)

## 2021-03-30 LAB — POCT GLUCOSE MONITORING DEVICE
Glucose: 209 mg/dL — ABNORMAL HIGH (ref 70–100)
Glucose: 141 mg/dL — ABNORMAL HIGH (ref 70–100)
Glucose: 198 mg/dL — ABNORMAL HIGH (ref 70–100)
Glucose: 280 mg/dL — ABNORMAL HIGH (ref 70–100)
Glucose: 254 mg/dL — ABNORMAL HIGH (ref 70–100)

## 2021-03-30 LAB — POC GLU MONITORING DEVICE
POC Glucose Monitoring Device: 209 mg/dL — ABNORMAL HIGH (ref 70–100)
POC Glucose Monitoring Device: 141 mg/dL (ref 70–100)
POC Glucose Monitoring Device: 198 mg/dL (ref 70–100)
POC Glucose Monitoring Device: 280 mg/dL (ref 70–100)
POC Glucose Monitoring Device: 254 mg/dL (ref 70–100)

## 2021-03-30 MED FILL — PANTOPRAZOLE 40 MG TABLET,DELAYED RELEASE: 40 40 MG | ORAL | Qty: 1

## 2021-03-30 MED FILL — SYNTHROID 100 MCG TABLET: 100 100 mcg | ORAL | Qty: 1

## 2021-03-30 MED FILL — PROPAFENONE 150 MG TABLET: 150 150 MG | ORAL | Qty: 2

## 2021-03-30 MED FILL — TAMSULOSIN 0.4 MG CAPSULE: 0.4 0.4 mg | ORAL | Qty: 1

## 2021-03-30 MED FILL — ASPIRIN 81 MG TABLET,DELAYED RELEASE: 81 81 MG | ORAL | Qty: 1

## 2021-03-30 MED FILL — REMDESIVIR 100 MG INTRAVENOUS POWDER FOR SOLUTION: 100 100 mg | INTRAVENOUS | Qty: 1

## 2021-03-30 MED FILL — VALSARTAN 160 MG TABLET: 160 160 MG | ORAL | Qty: 2

## 2021-03-30 MED FILL — VITAMIN C 500 MG TABLET: 500 500 MG | ORAL | Qty: 2

## 2021-03-30 MED FILL — ELIQUIS 5 MG TABLET: 5 5 mg | ORAL | Qty: 1

## 2021-03-30 MED FILL — ZINC SULFATE 50 MG ZINC (220 MG) CAPSULE: 50 50 mg zinc (220 mg) | ORAL | Qty: 1

## 2021-03-30 MED FILL — CARVEDILOL 25 MG TABLET: 25 25 MG | ORAL | Qty: 1

## 2021-03-30 MED FILL — ATORVASTATIN 10 MG TABLET: 10 10 MG | ORAL | Qty: 1

## 2021-03-30 MED FILL — LANTUS U-100 INSULIN 100 UNIT/ML SUBCUTANEOUS SOLUTION: 100 100 unit/mL | SUBCUTANEOUS | Qty: 0.05

## 2021-03-30 MED FILL — FERROUS SULFATE 325 MG (65 MG IRON) TABLET: 325 325 (65 FE) MG | ORAL | Qty: 1

## 2021-03-30 NOTE — Unmapped (Deleted)
CONTINUITY OF CARE FORM     Patient name: [name]  Patient MRN: [id number]  DOB: 02-07-1941  Age: 80 y.o.  Gender: male    Date of admission: 03/27/2021  Date of discharge: 03/30/2021    Attending provider: Emilio Aspen, MD  Primary care physician: Marcello Fennel, MD    Code status: Full Code  Allergies: No Known Allergies    Diagnoses Present on Admission   Primary Diagnosis: <principal problem not specified>  Discharge Diagnosis : There are no hospital problems to display for this patient.    Prognosis: fair  Rehabilitation potential: fair        Diet     Diet Orders          Diet consistent carb 45-60g/meal; cardiac=low fat, salt, cholesterol starting at 12/19 0727        Dysphagia Assessment and Recommendations (when available):    Dysphagia Diet Recommended (when available):      As listed above    Services Required   Skilled Nursing: Yes    PT Interventions and Frequency: Treatment/Interventions: LE strengthening/ROM, Endurance training, Patient/family training, Gait training, Compensatory technique education, Neuromuscular Reeducation, Therapeutic Activity, Therapeutic Exercise  PT Frequency: minimum 4x/week    PT Recommendations: Recommendation: IP Rehab  Equipment Recommended: Defer to facility to obtain    OT Interventions and Frequency: Treatment Interventions: ADL retraining, Activity Tolerance training, Functional transfer training, Patient/Family training, Therapeutic Activity, Compensatory technique education  OT Frequency: minimum 5x/week    OT Recommendations: Recommendation: IP Rehab  Equipment Recommendations: Defer at this time    Weight bearing status:  full    Bedside Swallow Recommendations (when available):      Speech Language Recommendations (when available):      Needs 24 hour supervision due to cognitive impairment: Yes    Discharge Medications   Medications:     Medication List      TAKE these medications, which you were ALREADY TAKING      Quantity/Refills   aspirin 81  MG EC tablet  Take 81 mg by mouth daily.   Refills: 0     atorvastatin 10 MG tablet  Commonly known as: LIPITOR  Take 10 mg by mouth at bedtime.   Refills: 0     carvediloL 25 MG tablet  Commonly known as: COREG  Take 25 mg by mouth 2 (two) times daily.   Refills: 0     Eliquis 5 mg Tab  Generic drug: apixaban  Take 5 mg by mouth 2 times a day.   Refills: 0     ferrous sulfate 325 (65 FE) MG tablet  Take 325 mg by mouth daily with breakfast.   Refills: 0     furosemide 20 MG tablet  Commonly known as: LASIX  Take 1 tablet (20 mg total) by mouth daily.   Quantity: 30 tablet  Refills: 0     glimepiride 2 MG tablet  Commonly known as: AMARYL  Take 1 tablet by mouth  every morning before  breakfast   Refills: 0     glyBURIDE-metformin 2.5-500 mg per tablet  Commonly known as: GLUCOVANCE  Take 2 tablets by mouth 2 times a day.   Refills: 0     levothyroxine 100 MCG tablet  Commonly known as: SYNTHROID  Take 100 mcg by mouth every morning.   Refills: 0     omeprazole 40 MG capsule  Commonly known as: PRILOSEC  Take 40 mg by mouth every morning.   Refills:  0     OSTEO BI-FLEX ORAL  Take by mouth. OSTEO BI-FLEX JOINT SHIELD TABS   Refills: 0     propafenone 225 MG tablet  Commonly known as: RYTHMOL  Take 225 mg by mouth 3 times a day.   Refills: 0     tamsulosin 0.4 mg Cap  Commonly known as: FLOMAX  Take 0.4 mg by mouth at bedtime.   Refills: 0     valsartan 320 MG tablet  Commonly known as: DIOVAN  Take 320 mg by mouth daily.   Refills: 0                  Discharge Specific Orders   Discharge specific orders:  None required    Isolation     Patient Isolation Status     Isolation Added Added By Removed Removed By    Airborne / Contact / Droplet 03/27/21 Gilmer Mor, PA              Physician Certification of Medically Necessary Transportation   Type and reason for transportation: Stretcher - patient is non-ambulatory.  Reason for transport to another facility: Rehabilitation  Patient requires: Continuous medical  supervision enroute    Follow-up Appointments and [facility] Discharge Physician Name     Future Appointments   Date Time Provider Department Center   06/30/2021 10:45 AM Garret Reddish [place], [facility] [id number]     No follow-up provider specified.  Follow up with their PCP as scheduled.  No follow-ups on file.    Physician Signature and Credentials   I certify that I have reviewed the information contained herein, and that the information is a true and accurate reflection of the individual's condition.    Discharging Physician: Electronically signed by Cyd Silence, MD  03/30/2021, 10:50 AM    SOCIAL WORK DOCUMENTATION     Facility/Agency Name:      Number to call report:      Level of Care at Discharge:             Less than 30 day convalescent stay:      PASARR/HENS 7000 Completed:      Family Member Name and Relationship Notified at Discharge:      Family Contact Number:      Interior and spatial designer Name and Telephone Number:        NURSE DISCHARGE ASSESSMENT   Vitals:  Patient Vitals for the past 4 hrs:   BP Temp Temp src Pulse Resp SpO2   03/30/21 0747 157/84 98.1 ??F (36.7 ??C) Axillary 82 16 94 %        Orientation:       Orientation Level: Oriented X4  Patient Behaviors: Cooperative, Calm    Respiratory:  Respiratory (WDL): Exceptions to WDL  Respiratory Pattern: Regular, Unlabored  Chest Assessment: Chest expansion symmetrical  Bilateral Breath Sounds: Diminished  R Breath Sounds: Diminished  L Breath Sounds: Diminished      Cardiac:  Cardiac (WDL): Exceptions to WDL  Heart Sounds: S1, S2  Pacemaker: No    Edema:  Peripheral Vascular (WDL): Within Defined Limits    Wounds:       Comfort/Mattress:  Additional Comfort/Environmental Interventions: Elevated, Head of bed elevated    Musculoskeletal:  Musculoskeletal (WDL): Exceptions to WDL  LUE: Full movement  RUE: Full movement  RLE: Full movement  LLE: Full movement    GI:  Gastrointestinal (WDL): Exceptions to WDL  GI  Symptoms: None  Last BM Date: 03/29/21  Bowel Incontinence: No  Stool Source: Rectum    GU:  Genitourinary (WDL): Within Defined Limits  Urinary Incontinence: No  Urine Source: Urethra    Lines and Drains:  Patient Lines/Drains/Airways Status     Active Line / PIV Line     Name Placement date Placement time Site Days    Peripheral IV 03/28/21 Distal;Posterior;Right Forearm 03/28/21  1819  Forearm  1                ADL's:  Level of Assistance: Minimal assist, patient does 75% or more  Feeding: Able to feed self  Level of Assistance: Minimal assist    Morse Fall Risk  Morse Fall Risk Score: (!) 60    Restraints:       RN to RN Handoff:  RN Giving Report:: [place]  RN Receiving Report:: Jon  Reason for Handoff: Shift Change        Nurse and Hospital doctor Handoff Completed by: [place] on 03/30/2021

## 2021-03-30 NOTE — Unmapped (Signed)
Problem: Glucose Imbalance related to diabetes disease process  Goal: Clinical indication of glucose balance is achieved  Outcome: Adequate for Discharge  Goal: Patient's discharge needs are met  Outcome: Adequate for Discharge     Problem: Knowledge deficit related to self-management of chronic disease  Goal: Patient/family/caregiver demonstrates understanding of disease process, treatment plan, medications, and discharge instructions  Outcome: Adequate for Discharge     Problem: Potential for imbalanced nutrition related to metabolic effect of diabetes  Goal: Patient's nutritional needs will be met  Outcome: Adequate for Discharge     Problem: Risk for ineffective therapeutic regimen management related to insulin pump  Goal: Blood glucose is within target range  Outcome: Adequate for Discharge     Problem: Patient will remain free of injury d/t fall  Goal: High Fall Risk Precautions  Outcome: Adequate for Discharge  Goal: Additional High Fall Risk Precautions (consider the following)  Outcome: Adequate for Discharge     Problem: Acute Pain  Description: Patient's pain progressing toward patient's stated pain goal  Goal: Patient displays improved well-being such as baseline levels for pulse, BP, respirations and relaxed muscle tone or body posture  Outcome: Adequate for Discharge  Goal: Patient will manage pain with the appropriate technique/intervention  Description: Assess and monitor patient's pain using appropriate pain scale. Collaborate with interdisciplinary team and initiate plan and interventions as ordered.  Re-assess patient's pain level 30-60 minutes after pain management intervention.  Outcome: Adequate for Discharge  Goal: Patient will reduce or eliminate use of analgesics  Outcome: Adequate for Discharge  Goal: Patients pain is managed to allow active participation in daily activities  Outcome: Adequate for Discharge  Goal: Patient verbalizes a reduction in pain level  Outcome: Adequate for  Discharge     Problem: Pain  Goal: Patient's pain is progressing toward patient's stated pain goal  Description: Assess and monitor patient's pain using appropriate pain scale. Collaborate with interdisciplinary team and initiate plan and interventions as ordered. Re-assess patient's pain level 30 - 60 minutes after pain management intervention.   Outcome: Adequate for Discharge     Problem: Safety  Goal: Patient will be injury free during hospitalization  Description: Assess and monitor vitals signs, neurological status including level of consciousness and orientation. Assess patient's risk for falls and implement fall prevention plan of care and interventions per hospital policy.      Ensure arm band on, uncluttered walking paths in room, adequate room lighting, call light and overbed table within reach, bed in low position, wheels locked, side rails up per policy, and non-skid footwear provided.   Outcome: Adequate for Discharge  Goal: Patient with weight > 350lbs will have appropriate equipment  Description: Consider ordering Bariatric Bed, Chair and Bedside Commode for patient weight > 350 lbs.  Outcome: Adequate for Discharge     Problem: Patient will remain free of falls  Goal: Universal Fall Precautions  Outcome: Adequate for Discharge     Problem: Daily Care  Goal: Daily care needs are met  Description: Assess and monitor ability to perform self care and identify potential discharge needs.  Outcome: Adequate for Discharge     Problem: Psychosocial Needs  Goal: Demonstrates ability to cope with hospitalization/illness  Description: Assess and monitor patients ability to cope with his/her illness.  Outcome: Adequate for Discharge  Goal: Collaborate with patient/family to identify patient's goals  Outcome: Adequate for Discharge     Problem: Discharge Barriers  Goal: Patient's discharge needs are met  Description: Collaborate with  interdisciplinary team and initiate plans and interventions as needed.    Outcome: Adequate for Discharge     Problem: Discharge Planning  Goal: Identify discharge needs  Outcome: Adequate for Discharge

## 2021-03-30 NOTE — Unmapped (Signed)
Problem: Glucose Imbalance related to diabetes disease process  Goal: Clinical indication of glucose balance is achieved  Outcome: Progressing  Goal: Patient's discharge needs are met  Outcome: Progressing     Problem: Knowledge deficit related to self-management of chronic disease  Goal: Patient/family/caregiver demonstrates understanding of disease process, treatment plan, medications, and discharge instructions  Outcome: Progressing     Problem: Potential for imbalanced nutrition related to metabolic effect of diabetes  Goal: Patient's nutritional needs will be met  Outcome: Progressing     Problem: Risk for ineffective therapeutic regimen management related to insulin pump  Goal: Blood glucose is within target range  Outcome: Progressing     Problem: Patient will remain free of injury d/t fall  Goal: High Fall Risk Precautions  Outcome: Progressing  Goal: Additional High Fall Risk Precautions (consider the following)  Outcome: Progressing     Problem: Acute Pain  Description: Patient's pain progressing toward patient's stated pain goal  Goal: Patient displays improved well-being such as baseline levels for pulse, BP, respirations and relaxed muscle tone or body posture  Outcome: Progressing  Goal: Patient will manage pain with the appropriate technique/intervention  Description: Assess and monitor patient's pain using appropriate pain scale. Collaborate with interdisciplinary team and initiate plan and interventions as ordered.  Re-assess patient's pain level 30-60 minutes after pain management intervention.  Outcome: Progressing  Goal: Patient will reduce or eliminate use of analgesics  Outcome: Progressing  Goal: Patients pain is managed to allow active participation in daily activities  Outcome: Progressing  Goal: Patient verbalizes a reduction in pain level  Outcome: Progressing     Problem: Pain  Goal: Patient's pain is progressing toward patient's stated pain goal  Description: Assess and monitor  patient's pain using appropriate pain scale. Collaborate with interdisciplinary team and initiate plan and interventions as ordered. Re-assess patient's pain level 30 - 60 minutes after pain management intervention.   Outcome: Progressing     Problem: Safety  Goal: Patient will be injury free during hospitalization  Description: Assess and monitor vitals signs, neurological status including level of consciousness and orientation. Assess patient's risk for falls and implement fall prevention plan of care and interventions per hospital policy.      Ensure arm band on, uncluttered walking paths in room, adequate room lighting, call light and overbed table within reach, bed in low position, wheels locked, side rails up per policy, and non-skid footwear provided.   Outcome: Progressing  Goal: Patient with weight > 350lbs will have appropriate equipment  Description: Consider ordering Bariatric Bed, Chair and Bedside Commode for patient weight > 350 lbs.  Outcome: Progressing     Problem: Patient will remain free of falls  Goal: Universal Fall Precautions  Outcome: Progressing     Problem: Daily Care  Goal: Daily care needs are met  Description: Assess and monitor ability to perform self care and identify potential discharge needs.  Outcome: Progressing     Problem: Psychosocial Needs  Goal: Demonstrates ability to cope with hospitalization/illness  Description: Assess and monitor patients ability to cope with his/her illness.  Outcome: Progressing  Goal: Collaborate with patient/family to identify patient's goals  Outcome: Progressing     Problem: Discharge Barriers  Goal: Patient's discharge needs are met  Description: Collaborate with interdisciplinary team and initiate plans and interventions as needed.   Outcome: Progressing     Problem: Discharge Planning  Goal: Identify discharge needs  Outcome: Progressing

## 2021-03-30 NOTE — Unmapped (Signed)
Progress Note  Admit Date: 03/27/2021         CC: F/U for covid 19/ weakness    Subjective:  Denies cp or sob.  No fever.    PHYSICAL EXAM:  BP 148/78 (BP Location: Left arm, Patient Position: Sitting)    Pulse 63    Temp 97.8 ??F (36.6 ??C) (Oral)    Resp 18    Ht 6' (1.829 m)    Wt (!) 260 lb 2.3 oz (118 kg)    SpO2 97%    BMI 35.28 kg/m??   No results for input(s): POCGLU in the last 72 hours.  No intake or output data in the 24 hours ending 05/31/21 1137  General appearance: alert, appears stated age and cooperative  Head: Normocephalic, without obvious abnormality, atraumatic  Lungs: clear to auscultation bilaterally and no respiratory distress  Heart: regular rate and rhythm  Extremities: extremities normal, atraumatic, no cyanosis or edema  Neurologic: Grossly normal    Lab Results   Component Value Date    WBC 9.9 03/30/2021    HGB 11.1 (L) 03/30/2021    HCT 33.5 (L) 03/30/2021    MCV 83.3 03/30/2021    PLT 179 03/30/2021     Lab Results   Component Value Date    CREATININE 0.80 03/30/2021    BUN 18 03/30/2021    NA 135 03/30/2021    K 3.9 03/30/2021    CL 101 03/30/2021    CO2 27 03/30/2021         Assessment & Plan:      1. Malaise and fatigue        2. COVID-19        3. Tachypnea          Treated w/ remdesivir- will complete 3 days.  No hypoxemia.  No need for steroids.    PT/OT recs noted- will need rehab- snf vs ipr.      Disposition: hopefully dc in am    Emilio Aspen, MD

## 2021-03-30 NOTE — Unmapped (Signed)
Occupational Therapy  Treatment and Tentative Discharge   Updated POC     Name: [name]  DOB: 1941/04/02  Attending Physician: Emilio Aspen, MD  Admission Diagnosis: Tachypnea [R06.82]  Malaise and fatigue [R53.81, R53.83]  COVID-19 [U07.1]  Date: 03/30/2021  Reviewed Pertinent hospital course: Yes   Hospital Course PT/OT: [name] is an 80 y.o. man who presented with severe weakness. Dx: Covid-19  Relevant PMH : L3-4 laminectomy and synovial cyst resection 03/23/21; afib  Precautions: Covid-19, MRSA, spinal precautions, fall risk, & full code  Activity Level: Activity as tolerated      Assessment- revised 03/30/21  OT 6 Clicks  Help From Another Person To Put On/Take Off Regular Lower Body Clothing: A little  Help From Another Person Bathing ( including washing ,rinsing,drying): A little  Help From Another Person Toileting, which includes using the toilet, bedpan or urinal: A little  Help From Another Person To Put On/Take Off Regular Upper Body Clothing: None  Help From Another Person Taking Care Of Personal Grooming: A little  Help From Another Person Eating Meals: None  OT 6 Clicks Score: 20  Assessment: Decreased activity tolerance, Decreased Functional Mobility, Decreased ADL status, Decreased Balance, Decreased self-care transfers, Decreased Safe judgment during ADL                Prognosis for OT goals: Good                                     Goals- all initial eval goals met this date.   Goals to be met in: 1 week, 04/04/21  Patient stated goal: to go home  Patient will complete supine to sit in prep for ADLs: Moderate assistance (goal met 03/29/21)  Patient will complete functional chair transfer: Minimal assistance (goal met 03/29/21)  Patient will complete toilet transfer: Minimal assistance (goal met 03/29/21)  Patient will complete toileting: Minimal assistance (goal met 03/29/21)  Patient will complete grooming task: Minimal assistance (goal met 03/29/21)  Patient will complete  lower body dressing: Maximum assistance (goal met 03/30/21)  Collaborated with: Patient    Goals- new goals with new POC set this date. Below goals are appropriate and achievable.   Goals to be met in: 7 days, 04/06/21  Patient stated goal: to go home  Patient will complete supine to sit in prep for ADLs: Modified Independent  Patient will complete functional chair transfer: Modified Independent  Patient will complete toilet transfer: Modified Independent  Patient will complete toileting: Modified Independent  Patient will complete grooming task: Modified Independent (in stance)  Patient will complete lower body dressing: Modified Independent  Collaborated with: Patient    Recommendation  Plan  Treatment Interventions: ADL retraining, Activity Tolerance training, Functional transfer training, Patient/Family training, Therapeutic Activity, Compensatory technique education  OT Frequency: minimum 5x/week  Recommendation  Recommendation: IP Rehab  Equipment Recommendations: Defer at this time    Problem List  Patient Active Problem List   Diagnosis   ??? Other, multiple, and unspecified sites, insect bite, nonvenomous, without mention of infection(919.4)   ??? Hemangioma of intra-abdominal structures   ??? Other specified disorders of liver   ??? 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 Disorder of esophagus   ??? Chest pain   ??? Contact dermatitis and other eczema, due to unspecified cause   ??? Morbid obesity (CMS Dx)   ??? Obstructive sleep  apnea   ??? Hypothyroidism   ??? Type II or unspecified type diabetes mellitus without mention of complication, not stated as uncontrolled (CMS Dx)   ??? Other and unspecified hyperlipidemia   ??? Essential hypertension   ??? Atrial fibrillation (CMS Dx)   ??? Congestive heart failure (CMS Dx)   ??? Hemorrhoids   ??? Esophageal reflux   ??? Pain in limb   ??? Special screening for malignant neoplasm of prostate   ??? Multiple rib fractures   ??? OSA (obstructive sleep apnea)        Past Medical History  Past  Medical History:   Diagnosis Date   ??? Atrial fibrillation (CMS Dx)     corrected with Mini-maze procedure   ??? Cataract     left eye   ??? Diabetes mellitus (CMS Dx)     type 2   ??? Dilated cardiomyopathy (CMS Dx)    ??? Hearing loss     left ear   ??? Hypercholesteremia    ??? Hypertension    ??? Sleep apnea    ??? Thyroid disease     hypothyroidism        Past Surgical History  Past Surgical History:   Procedure Laterality Date   ??? COLONOSCOPY W/ POLYPECTOMY      benign  has at 3 plus procedures   ??? HERNIA REPAIR  9/09    right inguinal, mini maze   ??? HERNIA REPAIR  2/10    herniated lungs bilateral (repaired with mesh)   ??? mini-maze      corrected his A-fib   ??? rt eye  02/2018   ??? SEPTOPLASTY     ??? UVULOPALATOPHARYNGOPLASTY     ??? UVULOPALATOPHARYNGOPLASTY      has had esophageal dilations in the past (Dr.Decter)   ??? WISDOM TOOTH EXTRACTION           Cognition  Overall Cognitive Status: Within Functional Limits  Cognitive Assessment: Arousal/ Alertness  Arousal/Alertness: Alert  Orientation Level: Oriented X4  Behavior: Appropriate;Cooperative  Following Commands: Follows multistep commands  Safety Judgment: Decreased awareness of safety precautions  Comments: Pt able to recall 2/3 spinal precautions.    Pain  Pain Score: 0 - No Pain (0/10 at begining and no c/o throughout)  Pain Location: Back  Pain Descriptors: Burning  Pain Intervention(s): Repositioned  Therapist reported pain to: RN monitoring for needs.     Mobility  Bed Mobility  Rolling: Contact Guard assistance;cues for log rolling  Supine to Sit: Contact Guard assistance  Sit to Supine: Contact Guard assistance  Unable to progress further due to: pt completed supine to sit x2 trials and sit to supine x3 trials to practice log rolling technique- continues to require cues for safe technique  Functional Transfers  Sit to Stand: Supervision  Stand to Sit: Supervision  Lateral Transfers: Supervision (to take lateral steps toward HOB-no AD)  Balance  Sitting - Static:  Independent  Sitting - Dynamic: Independent  Standing - Static: Supervision  Standing - Dynamic: Not Tested        Exercises       ADL  Lower Body Dressing: Supervision  Lower Body Dressing Deficit: Set-up;Supervision/safety;Verbal cueing;Use of adaptive equipment  Location Assessed LE Dressing: Seated edge of bed  Lower Body Dressing Deficit Additional Comments: practiced reacher and sock aide use- did not issue- pt to d/c to SNF and recommendations for equipment may change    Instrumental Activities of Daily Living       Position  after Treatment and Safety Handoff  Position after therapy session: Bed in chair position  Details: RN notified;Call light/ needs within reach  Alarms: Bed  Alarms Status: Activated and Interfaced with call system      Patient Education  Educated pt on role of OT  Educated pt on LE AE  Educated pt on log rolling and bed functional mobility     Handoff of Care:  Safety Handoff completed with Revonda Standard, RN after Rehabilitation session. Following session, pt in bed with all needs in reach. Pt tolerated session well. Cooperative during session. Please allow this note to serve as the D/C summary if pt D/C [facility] prior to next OT session.     Tentative Discharge Summary:  Pt expected to d/c to IPR this date.   6 out of 6 goals met.   Recommend IPR.  Tentative d/c from acute OT.      Time  Start Time: 1224  Stop Time: 1253  Time Calculation (min): 29 min    Charges    $Self Care/ADL/Home Management Training: 23-37 mins    Monico Blitz, COTA/L [phone]  PRN [facility] [phone] or [phone]  03/30/2021

## 2021-03-30 NOTE — Unmapped (Signed)
Problem: Knowledge Deficit  Goal: Patient/family/caregiver demonstrates understanding of disease process, treatment plan, medications, and discharge instructions  Description: Complete learning assessment and assess knowledge base.  Outcome: Progressing     Problem: Anxiety  Goal: Anxiety is at manageable level  Description: Assess and monitor patient's anxiety level.  Monitor for signs and symptoms of anxiety both physical and emotional (heart palpitations, chest pain, shortness of breath, headaches, nausea, feeling jumpy, restlessness, irritable, apprehensive).  Collaborate with interdisciplinary team and initiate plan and interventions as ordered.  Outcome: Progressing     Problem: Safety  Goal: Patient will be injury free during hospitalization  Description: Assess and monitor vitals signs, neurological status including level of consciousness and orientation. Assess patient's risk for falls and implement fall prevention plan of care and interventions per hospital policy.      Ensure arm band on, uncluttered walking paths in room, adequate room lighting, call light and overbed table within reach, bed in low position, wheels locked, side rails up per policy, and non-skid footwear provided.   Outcome: Progressing     Problem: Patient will remain free of falls  Goal: Universal Fall Precautions  Outcome: Progressing

## 2021-03-30 NOTE — Unmapped (Signed)
Physical Therapy                                            Treatment and Tentative Discharge    Name: [name]  DOB: Oct 03, 1940  Attending Physician: Emilio Aspen, MD  Admission Diagnosis: Tachypnea [R06.82]  Malaise and fatigue [R53.81, R53.83]  COVID-19 [U07.1]  Date: 03/30/2021  Reviewed Pertinent hospital course: Yes  Hospital Course PT/OT: [name] is an 80 y.o. man who presented with severe weakness. Dx: Covid-19  Relevant PMH : L3-4 laminectomy and synovial cyst resection 03/23/21; afib  Precautions: Covid-19, MRSA, spinal precautions, fall risk, & full code  Activity Level: Activity as tolerated    Assessment  PT 6 Clicks  Help From Another Person Turning From Back to Side While Flat in Bed Without Using Siderails: A little  Help From Another Person Moving From Lying On Back To Sitting Without Using Siderails: A little  Help From Another Person Moving To And From Bed To Chair: A little  Help From Another Person Standing Up From Chair Using Your Arms: A little  Help From Another Person To Walk In Hospital Room: A little  Help From Another Person Climbing 3-5 Steps With A Railing: A lot  PT 6 Clicks Score: 17  Assessment: Impaired Bed Mobility, Impaired Transfers, Impaired Gait, Impaired Balance, Impaired Strength, Impaired ROM, Impaired Safety Awareness, Impaired Stair Negotiation, Impaired Activity Tolerance, Deconditioning     Prognosis: Good    Goals  4 out of 5 goals met.    Collaborated with: Patient  Goals to be met by: 04/04/21  Patient will transition from supine to sit: Minimal assistance, head of bed flat, with bedrails (using log roll technique)  Patient will transition from sit to supine: head of bed flat, with bedrails, Minimal assistance (using log roll technique; GOAL MET 03/30/21.)  Patient will transfer from sit to stand: Minimal assistance, up to assistive device (RW while maintaining spinal precautions; GOAL MET 03/29/21)  Patient will transfer bed/chair: Minimal  assistance, via stand step (with RW while maintaining spinal precautions; GOAL MET 03/29/21)  Patient will ambulate: Minimal assistance, distance (in feet), with assistive device (GOAL MET 03/29/21)   Ambulation Asssistance Device: Rolling walker  Distance (in feet): 35 ft    Recommendation  Plan  Treatment/Interventions: LE strengthening/ROM, Endurance training, Patient/family training, Gait training, Compensatory technique education, Neuromuscular Reeducation, Therapeutic Activity, Therapeutic Exercise  PT Frequency: minimum 4x/week    Recommendation  Recommendation: IP Rehab  Equipment Recommended: Defer to facility to obtain    By Discharge from [facility], I anticipate that this patient will be able to tolerate Physical Therapy / Rehabilitation Services 5 days / week for 3 hrs each day.     Problem List  Patient Active Problem List   Diagnosis   ??? Other, multiple, and unspecified sites, insect bite, nonvenomous, without mention of infection(919.4)   ??? Hemangioma of intra-abdominal structures   ??? Other specified disorders of liver   ??? 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 Disorder of esophagus   ??? Chest pain   ??? Contact dermatitis and other eczema, due to unspecified cause   ??? Morbid obesity (CMS Dx)   ??? Obstructive sleep apnea   ??? Hypothyroidism   ??? Type II or unspecified type diabetes mellitus without mention of complication, not stated as uncontrolled (CMS  Dx)   ??? Other and unspecified hyperlipidemia   ??? Essential hypertension   ??? Atrial fibrillation (CMS Dx)   ??? Congestive heart failure (CMS Dx)   ??? Hemorrhoids   ??? Esophageal reflux   ??? Pain in limb   ??? Special screening for malignant neoplasm of prostate   ??? Multiple rib fractures   ??? OSA (obstructive sleep apnea)        Past Medical History  Past Medical History:   Diagnosis Date   ??? Atrial fibrillation (CMS Dx)     corrected with Mini-maze procedure   ??? Cataract     left eye   ??? Diabetes mellitus (CMS Dx)     type 2   ??? Dilated  cardiomyopathy (CMS Dx)    ??? Hearing loss     left ear   ??? Hypercholesteremia    ??? Hypertension    ??? Sleep apnea    ??? Thyroid disease     hypothyroidism        Past Surgical History  Past Surgical History:   Procedure Laterality Date   ??? COLONOSCOPY W/ POLYPECTOMY      benign  has at 3 plus procedures   ??? HERNIA REPAIR  9/09    right inguinal, mini maze   ??? HERNIA REPAIR  2/10    herniated lungs bilateral (repaired with mesh)   ??? mini-maze      corrected his A-fib   ??? rt eye  02/2018   ??? SEPTOPLASTY     ??? UVULOPALATOPHARYNGOPLASTY     ??? UVULOPALATOPHARYNGOPLASTY      has had esophageal dilations in the past (Dr.Decter)   ??? WISDOM TOOTH EXTRACTION         Cognition:  Overall Cognitive Status: Within Functional Limits  Cognitive Assessment: Arousal/ Alertness;Orientation Level;Behavior;Following Commands  Arousal/Alertness: Alert  Orientation Level: Oriented X4  Behavior: Appropriate;Cooperative  Following Commands: Follows multistep commands  Comments: Pt able to recall 2/3 spinal precautions.     Pain:  Pain Score:  (Unable to rate.)  Pain Location: Back  Pain Descriptors: Burning  Pain Intervention(s): Repositioned  Therapist reported pain to: RN monitoring for needs.     Mobility:  Bed Mobility  Supine to Sit:  (Pt up in chair at beginning of session.)  Sit to Supine: Minimal assistance;increased time to complete task;head of bed elevated;towards the left  Transfers  Sit to Stand: Minimal assistance;Contact Guard assistance (Min A from normal height chair, CGA from elevated surface.)  Stand to Sit: Contact Guard assistance;increased time to complete task;cues for hand placement  Bed to Chair: Contact Guard assistance (with RW)  Other Transfers/ Comments: 8 sit<>stands from various heights for focus on hand placement.  Gait  Distance: 15 feet + 15 feet +40 feet +40 feet  Level of Assistance: Contact Guard assistance  Assistive Device: Rolling walker  Gait Characteristics: Unsteady;swing-through pattern;decreased  cadence;No LOB;Increased trunk flexion  Balance  Sitting - Static: Modified Independent  Sitting - Dynamic: Supervision  Standing - Static: Stand-by assistance (with RW)  Standing - Dynamic: Contact Guard Assistance (with RW)    Position after Treatment and Safety Handoff  Position after therapy session: Bed  Details: RN notified;Call light/ needs within reach  Alarms: Bed  Alarms Status: Activated and Interfaced with call system    Patient Education  Pt educated on role of therapy, POC, and importance of OOB mobility. Educated on importance of seated therapeutic exercises and up to the chair with staff. Pt verbalized understanding.  Time  Start Time: 1058  Stop Time: 1123  Time Calculation (min): 25 min    Charges     $Gait/Mobility: 8-22 mins  $Therapeutic Activity: 1 unit    If the pt is discharged from [facility] prior to the next PT session, this note will serve as the PT Discharge Summary with the recommendations as outlined above. Updated goals are above. If the pt remains here at [facility] then PT will continue per POC.    Goals not met due to:  1. Prematurity of the time frame set.  2. Brevity of treatment interventions.  3. Severity of Weakness.  4. Increased Pain.  5. Slow Medical Status progress.    Ellison Hughs, PT, DPT  - [facility]  03/30/2021

## 2021-03-30 NOTE — Unmapped (Signed)
[facility]  Case Management/Social Work Department  Progress Note    Patient Information     Hospital day: 0  Inpatient/Observation:  Observation   Admit date:  03/27/2021  Admission diagnosis: Tachypnea [R06.82]  Malaise and fatigue [R53.81, R53.83]  COVID-19 [U07.1]    Other Pertinent Information     1538 Cm has spoken with Joni Reining at [facility] several times and still no pre-cert.  Patient, MD and bedside RNs are updated.      Discharge Plan     Anticipated discharge plan:  [facility]    Discharge barriers: pre-cert     Anticipated discharge date:  03/30/2021 or 03/31/2021      CM/SW will continue to follow and remain available until hospital discharge for discharge planning needs.      Nettie Elm RN, CCM  Ascom [phone]

## 2021-03-30 NOTE — Unmapped (Signed)
[facility] 5PCT Nursing  Note    Patient Name: [name]    Admit Date:03/27/2021     Admission Diagnosis: Tachypnea [R06.82]  Malaise and fatigue [R53.81, R53.83]  COVID-19 [U07.1]       ARGENIS KUMARI is alert and oriented. Pt received medications and participated in therapy with PT/OT. Spiritual Care consult requested via Epic due to Pt's concern for state of spouse.  Call light within reach. Bed in lowest position. Bed alarm activated. RN will continue plan of care.      Patient Vitals for the past 12 hrs:   BP Temp Temp src Pulse Resp SpO2   03/30/21 1550 146/82 97.7 ??F (36.5 ??C) Oral 70 16 93 %   03/30/21 1130 134/75 97.8 ??F (36.6 ??C) Oral 69 16 93 %   03/30/21 0747 157/84 98.1 ??F (36.7 ??C) Axillary 82 16 94 %         O2 Device: None (Room air)                   I/O last 3 completed shifts:  In: 3679.9 [P.O.:1260; I.V.:2169.8; IV Piggyback:250.1]  Out: 1775 [Urine:1775]      No intake/output data recorded.           Bernerd Pho, RN 03/30/2021 7:25 PM

## 2021-03-31 LAB — POCT GLUCOSE MONITORING DEVICE
Glucose: 169 mg/dL — ABNORMAL HIGH (ref 70–100)
Glucose: 185 mg/dL — ABNORMAL HIGH (ref 70–100)
Glucose: 283 mg/dL — ABNORMAL HIGH (ref 70–100)

## 2021-03-31 LAB — POC GLU MONITORING DEVICE
POC Glucose Monitoring Device: 169 mg/dL (ref 70–100)
POC Glucose Monitoring Device: 185 mg/dL (ref 70–100)
POC Glucose Monitoring Device: 283 mg/dL (ref 70–100)

## 2021-03-31 MED FILL — CARVEDILOL 25 MG TABLET: 25 25 MG | ORAL | Qty: 1

## 2021-03-31 MED FILL — VALSARTAN 160 MG TABLET: 160 160 MG | ORAL | Qty: 2

## 2021-03-31 MED FILL — ZINC SULFATE 50 MG ZINC (220 MG) CAPSULE: 50 50 mg zinc (220 mg) | ORAL | Qty: 1

## 2021-03-31 MED FILL — SYNTHROID 100 MCG TABLET: 100 100 mcg | ORAL | Qty: 1

## 2021-03-31 MED FILL — PANTOPRAZOLE 40 MG TABLET,DELAYED RELEASE: 40 40 MG | ORAL | Qty: 1

## 2021-03-31 MED FILL — ELIQUIS 5 MG TABLET: 5 5 mg | ORAL | Qty: 1

## 2021-03-31 MED FILL — PROPAFENONE 150 MG TABLET: 150 150 MG | ORAL | Qty: 2

## 2021-03-31 MED FILL — TAMSULOSIN 0.4 MG CAPSULE: 0.4 0.4 mg | ORAL | Qty: 1

## 2021-03-31 MED FILL — FERROUS SULFATE 325 MG (65 MG IRON) TABLET: 325 325 (65 FE) MG | ORAL | Qty: 1

## 2021-03-31 MED FILL — VITAMIN C 500 MG TABLET: 500 500 MG | ORAL | Qty: 2

## 2021-03-31 MED FILL — LANTUS U-100 INSULIN 100 UNIT/ML SUBCUTANEOUS SOLUTION: 100 100 unit/mL | SUBCUTANEOUS | Qty: 0.05

## 2021-03-31 MED FILL — ASPIRIN 81 MG TABLET,DELAYED RELEASE: 81 81 MG | ORAL | Qty: 1

## 2021-03-31 NOTE — Unmapped (Signed)
Problem: Anxiety  Goal: Anxiety is at manageable level  Description: Assess and monitor patient's anxiety level.  Monitor for signs and symptoms of anxiety both physical and emotional (heart palpitations, chest pain, shortness of breath, headaches, nausea, feeling jumpy, restlessness, irritable, apprehensive).  Collaborate with interdisciplinary team and initiate plan and interventions as ordered.  Outcome: Progressing     Problem: Safety  Goal: Patient will be injury free during hospitalization  Description: Assess and monitor vitals signs, neurological status including level of consciousness and orientation. Assess patient's risk for falls and implement fall prevention plan of care and interventions per hospital policy.      Ensure arm band on, uncluttered walking paths in room, adequate room lighting, call light and overbed table within reach, bed in low position, wheels locked, side rails up per policy, and non-skid footwear provided.   Outcome: Progressing     Problem: Patient will remain free of falls  Goal: Universal Fall Precautions  Outcome: Progressing

## 2021-03-31 NOTE — Unmapped (Addendum)
Hospital Discharge Summary    Patient's PCP: Marcello Fennel, MD  Admit Date: 03/27/2021   Discharge Date: 03/31/2021    Admitting Physician: Dr. Bonnetta Barry admitting provider for patient encounter.  Discharge Physician: Dr. Anibal Henderson Abb Gobert   Consults: none    HPI:   80 y.o. male w/ past medical history of afib s/p mini MAZE procedure and anticoagulated with eliquis, dilated cardiomyopathy, diabetes, OSA, who is here with acute covid 19 infection.  He was at home earlier today and says he suddenly developed severe weakness where his legs felt like they were going to give out on him around 11:00 am.  He then came to the ER and tested positive for covid 19.   He was recently treated for synovial cyst of the lumbar spine, spinal stenosis with neurogenic claudication with surgery decompressive laminectomy with excision of synovial cyst on 12/13 by Dr. Zola Button.  He was doing well at home until earlier today when he developed the leg weakness.  He denies having cough, fever, headache, nausea, vomiting, at home prior to today.      Discharge Diagnoses and Brief hospital course:    Covid   Not hypoxemic.  Was given remdesivir x 3 days- dc.  No need for steroids or o2.    Weakness  Secondary to covid  PT/OT evaluation- snf vs ipr.  ??  S/p decompression of the lumbar spine 03/23/21  Synovial cyst removed  Eliquis and Asprin resumed.    HTN  Continue coreg and valsartan  ??  Cardiomyopathy  Not decompensated.  ??  Hx atrial fibrillation  On eliquis.  ??  OSA  Uses CPAP  ??  DM2, uncontrolled  Accu checks/ ssi/ lantus in house.    Physical Exam: BP 125/64 (BP Location: Left arm, Patient Position: Standing)    Pulse 67    Temp 98.3 ??F (36.8 ??C) (Oral)    Resp 16    Ht 6' (1.829 m)    Wt (!) 260 lb 2.3 oz (118 kg)    SpO2 97%    BMI 35.28 kg/m??     General appearance: alert, appears stated age and cooperative  Head: Normocephalic, without obvious abnormality, atraumatic  Lungs: clear to auscultation bilaterally and no respiratory  distress  Extremities: edema none  Neurologic: Grossly normal      Discharge Medications:     Medication List      TAKE these medications      PRN MORN NOON EVE BED   aspirin 81 MG EC tablet                    atorvastatin 10 MG tablet  Commonly known as: LIPITOR                    carvediloL 25 MG tablet  Commonly known as: COREG                    Eliquis 5 mg Tab  Generic drug: apixaban                    ferrous sulfate 325 (65 FE) MG tablet                    furosemide 20 MG tablet  Commonly known as: LASIX  Take 1 tablet (20 mg total) by mouth daily.  glimepiride 2 MG tablet  Commonly known as: AMARYL                    glyBURIDE-metformin 2.5-500 mg per tablet  Commonly known as: GLUCOVANCE                    levothyroxine 100 MCG tablet  Commonly known as: SYNTHROID                    omeprazole 40 MG capsule  Commonly known as: PRILOSEC                    OSTEO BI-FLEX ORAL                    propafenone 225 MG tablet  Commonly known as: RYTHMOL                    tamsulosin 0.4 mg Cap  Commonly known as: FLOMAX                    valsartan 320 MG tablet  Commonly known as: DIOVAN                          Activity: activity as tolerated  Diet: regular diet    Disposition: Home w/ HH  Discharged Condition: Stable  Follow Up: Primary Care Physician in one week    Total time spent on discharge, finalizing medications, referrals and arranging follow up was 32 minutes.    Thank you Dr. Marcello Fennel, MD for the opportunity to be involved in this patients care.

## 2021-03-31 NOTE — Unmapped (Signed)
[facility] 5PCT Nursing  Note    Patient Name: [name]    Admit Date:03/27/2021     Admission Diagnosis: Tachypnea [R06.82]  Malaise and fatigue [R53.81, R53.83]  COVID-19 [U07.1]       FAWAZ BORQUEZ is alert and oriented. Patient denies pain. Call light within reach. Bed in lowest position. Bed alarm activated. RN will continue plan of care.      Patient Vitals for the past 12 hrs:   BP Temp Temp src Pulse Resp SpO2   03/31/21 1119 125/64 98.3 ??F (36.8 ??C) Oral 67 16 97 %   03/31/21 0745 167/78 97.6 ??F (36.4 ??C) Oral 91 16 94 %   03/31/21 0416 148/78 97.7 ??F (36.5 ??C) Axillary 100 16 100 %         O2 Device: None (Room air)           O2 Flow Rate (L/min): 0 L/min       I/O last 3 completed shifts:  In: 780 [P.O.:780]  Out: 2125 [Urine:2125]      I/O this shift:  In: 240 [P.O.:240]  Out: 250 [Urine:250]       Family/Significant Other Communication:         Berdine Addison, RN 03/31/2021 2:24 PM

## 2021-03-31 NOTE — Unmapped (Signed)
Patient  has order to be discharged to home. Belongings returned. Discharge instructions reviewed. After Visit Summary printed, signed and copy given to pt/family.Patient verbalized understanding of all information . All questions answered. No signs of distress noted at discharge. Patient escorted out via wheelchair by staff.  Discharge complete. Frederic Jericho, RN

## 2021-03-31 NOTE — Unmapped (Signed)
Home Health Care Services have been arranged for you with:    [facility]   Contact # [phone]  - Diane Reed, RN Coordinator  Office # [phone]     If you have not heard from provider within 1 day of discharge, please call the number above.

## 2021-03-31 NOTE — Unmapped (Addendum)
[facility]  Case Management/Social Work Department  Progress Note    Patient Information     Hospital day: 0  Inpatient/Observation:  Observation   Admit date:  03/27/2021  Admission diagnosis: Tachypnea [R06.82]  Malaise and fatigue [R53.81, R53.83]  COVID-19 [U07.1]    Other Pertinent Information     0941 CM spoke with Joni Reining at [facility], she will check on pre-cert and let CM know when it is obtained.    1130 CM notified by MD that patient wishes to return home with [facility] as his wife will be returning home with respite care on Friday and has not been doing well there due to her dementia.  Plan is to discharge today.    Discharge Plan     Anticipated discharge plan:  [facility]    Discharge barriers: pre-cert     Anticipated discharge date:  03/31/2021       CM/SW will continue to follow and remain available until hospital discharge for discharge planning needs.      Nettie Elm RN, CCM  Ascom [phone]

## 2021-03-31 NOTE — Unmapped (Signed)
Occupational Therapy  Treatment and Tentative Discharge     Name: [name]  DOB: 08-03-1940  Attending Physician: Emilio Aspen, MD  Admission Diagnosis: Tachypnea [R06.82]  Malaise and fatigue [R53.81, R53.83]  COVID-19 [U07.1]  Date: 03/31/2021  Reviewed Pertinent hospital course: Yes   Hospital Course PT/OT: Rahil Passey is an 80 y.o. man who presented with severe weakness. Dx: Covid-19  Relevant PMH : L3-4 laminectomy and synovial cyst resection 03/23/21; afib  Precautions: Covid-19, MRSA, spinal precautions, fall risk, & full code  Activity Level: Activity as tolerated    Assessment  OT 6 Clicks  Help From Another Person To Put On/Take Off Regular Lower Body Clothing: A little  Help From Another Person Bathing ( including washing ,rinsing,drying): A little  Help From Another Person Toileting, which includes using the toilet, bedpan or urinal: A little  Help From Another Person To Put On/Take Off Regular Upper Body Clothing: A little  Help From Another Person Taking Care Of Personal Grooming: A little  Help From Another Person Eating Meals: None  OT 6 Clicks Score: 19  Assessment: Decreased activity tolerance, Decreased Functional Mobility, Decreased ADL status, Decreased Balance, Decreased self-care transfers, Decreased Safe judgment during ADL         Prognosis for OT goals: Good                     Goals  Goals to be met in: 7 days, 04/06/21  Patient stated goal: to go home  Patient will complete supine to sit in prep for ADLs: Modified Independent  Patient will complete functional chair transfer: Modified Independent  Patient will complete toilet transfer: Modified Independent  Patient will complete toileting: Modified Independent  Patient will complete grooming task: Modified Independent (in stance)  Patient will complete lower body dressing: Modified Independent  Collaborated with: Patient    Recommendation  Plan  Treatment Interventions: ADL retraining, Activity Tolerance training,  Functional transfer training, Patient/Family training, Therapeutic Activity, Compensatory technique education  Progress: Progressing toward goals  OT Frequency: minimum 5x/week  Recommendation  Recommendation: IP Rehab  Equipment Recommendations: Defer at this time    Problem List  Patient Active Problem List   Diagnosis   ??? Other, multiple, and unspecified sites, insect bite, nonvenomous, without mention of infection(919.4)   ??? Hemangioma of intra-abdominal structures   ??? Other specified disorders of liver   ??? 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 Disorder of esophagus   ??? Chest pain   ??? Contact dermatitis and other eczema, due to unspecified cause   ??? Morbid obesity (CMS Dx)   ??? Obstructive sleep apnea   ??? Hypothyroidism   ??? Type II or unspecified type diabetes mellitus without mention of complication, not stated as uncontrolled (CMS Dx)   ??? Other and unspecified hyperlipidemia   ??? Essential hypertension   ??? Atrial fibrillation (CMS Dx)   ??? Congestive heart failure (CMS Dx)   ??? Hemorrhoids   ??? Esophageal reflux   ??? Pain in limb   ??? Special screening for malignant neoplasm of prostate   ??? Multiple rib fractures   ??? OSA (obstructive sleep apnea)        Past Medical History  Past Medical History:   Diagnosis Date   ??? Atrial fibrillation (CMS Dx)     corrected with Mini-maze procedure   ??? Cataract     left eye   ??? Diabetes mellitus (CMS Dx)  type 2   ??? Dilated cardiomyopathy (CMS Dx)    ??? Hearing loss     left ear   ??? Hypercholesteremia    ??? Hypertension    ??? Sleep apnea    ??? Thyroid disease     hypothyroidism        Past Surgical History  Past Surgical History:   Procedure Laterality Date   ??? COLONOSCOPY W/ POLYPECTOMY      benign  has at 3 plus procedures   ??? HERNIA REPAIR  9/09    right inguinal, mini maze   ??? HERNIA REPAIR  2/10    herniated lungs bilateral (repaired with mesh)   ??? mini-maze      corrected his A-fib   ??? rt eye  02/2018   ??? SEPTOPLASTY     ??? UVULOPALATOPHARYNGOPLASTY     ???  UVULOPALATOPHARYNGOPLASTY      has had esophageal dilations in the past (Dr.Decter)   ??? WISDOM TOOTH EXTRACTION           Cognition  Overall Cognitive Status: Within Functional Limits  Arousal/Alertness: Alert  Orientation Level: Oriented X4  Behavior: Appropriate;Cooperative  Following Commands: Follows all commands and directions without difficulty  Comments: Verbalized 3/3 spinal precautions    Pain  Pain Score: 0 - No Pain     Mobility  Bed Mobility  Rolling: Supervision;towards the left  Supine to Sit: Stand-by assistance;head of bed flat;towards the left;use of handrail  Sit to Supine:  (Patient sitting in chair at end of session.)  Functional Transfers  Sit to Stand: Contact Guard assistance;increased time to complete task;up to assistive device  Sit to Stand Assistive Device: Rolling walker  Stand to Sit: Contact Guard assistance;increased time to complete task;with assistive device  Stand to Sit Assistive Device: Rolling walker  Bed to Chair: Contact Guard assistance;increased time to complete task;with assistive device  Bed to Chair Assistive Device: Rolling walker  Functional Mobility: Contact guard assistance;with assistive device  International aid/development worker Device: Rolling walker  Balance  Sitting - Static: Independent  Sitting - Dynamic: Independent  Standing - Static: With Assistive Device;Stand-by assistance   Standing-Static Assistive Device: Rolling walker  Standing - DynamicContractor;With Assistive Device  Standing-Dynamic Assistive Device: Rolling walker    ADL  Feeding: Independent (water)  Feeding Deficit: Set-up  Location Assessed Eating: Seated in chair  Grooming: Supervision  Grooming Deficit: Set-up;Supervision/safety;Increased time to complete;Standing with assistive device  Location Assessed Grooming: Standing at sink  Grooming Deficit Additional Comments: wash face  Upper Body Dressing: Minimal assistance  Upper Body Dressing Deficit: Increased time to  complete;Fasteners;Set-up;Pull around back  Location Assessed UE Dressing: Seated in chair  Upper Body Dressing Deficit Additional Comments: doff/don gown    Position after Treatment and Safety Handoff  Position after therapy session: Bed  Details: RN notified;Call light/ needs within reach  Alarms: Bed  Alarms Status: Activated and Interfaced with call system    Patient Education  Patient educated on role of occupational therapist/plan of care, functional transfers, discharge recommendations, spinal precautions and safety with ADLs. Patient demonstrated good understanding of education.    Tentative Discharge Summary:  Patient with tentative discharge scheduled, should patient discharge prior to next OT treatment session, please allow this note to serve as a tentative discharge summary. Not all goals met. Goals not fully met secondary to medical complexities, generalized weakness. No equipment issued. Recommend IPR with ongoing therapy services. Tentative discharge from acute OT to IPR. Should patient remain  in hospital [facility]), will resume OT per POC.     Time  Start Time: 0854  Stop Time: 0924  Time Calculation (min): 30 min    Charges       $Therapeutic Activity: 8-22 mins  $Self Care/ADL/Home Management Training: 8-22 mins    Elson Clan, OTR/L   Occupational Therapist  [facility]  03/31/2021

## 2021-03-31 NOTE — Unmapped (Signed)
REFERRAL FOR HOME HEALTH SERVICES FORM     Patient name: [name]  Patient MRN: [id number]  DOB: 1940-12-04  Age: 80 y.o.  Gender: male  SSN: [id number]  Address: [address] [place] [address]     Phone number: [phone] (home)    Patient emergency contact: Extended Emergency Contact Information  Primary Emergency Contact: Josem Kaufmann  Address: [address] [place], [place] [address] [place]  Home Phone: [phone]  Relation: Spouse  Preferred language: English  Interpreter needed? No    Date of admission: 03/27/2021  Date of discharge: 03/31/2021  Attending provider: Emilio Aspen, MD  Primary care physician: Marcello Fennel, MD    Code status: Full Code  Allergies: No Known Allergies    Insurance Information     Insurance Information                ANTHEM MANAGED MEDICARE/BLUE MEDICARE ADVANTAGE Phone: --    Subscriber: Tyshun, T[place]erman Subscriber#: [id number]    Group#: [id number] Precert#: --          Diagnoses Present on Admission   Primary Diagnosis: <principal problem not specified>  Discharge Diagnosis : There are no hospital problems to display for this patient.    Prognosis: fair  Rehabilitation potential: fair    Diet     Diet Orders          Diet consistent carb 45-60g/meal; cardiac=low fat, salt, cholesterol starting at 12/19 0727           As listed above    Services Required   Skilled Nursing  Physical Therapy: Plan  Treatment/Interventions: LE strengthening/ROM, Endurance training, Patient/family training, Gait training, Compensatory technique education, Neuromuscular Reeducation, Therapeutic Activity, Therapeutic Exercise  PT Frequency: minimum 4x/week  Recommendation  Recommendation: IP Rehab  Equipment Recommended: Defer to facility to obtain  Occupational Therapy: Plan  Treatment Interventions: ADL retraining, Activity Tolerance training, Functional transfer training, Patient/Family training, Therapeutic Activity, Compensatory technique  education  Progress: Progressing toward goals  OT Frequency: minimum 5x/week  Recommendation  Recommendation: IP Rehab  Equipment Recommendations: Defer at this time    Weight bearing status: full    Needs 24 hour supervision due to cognitive impairment: No    Discharge Medications   Medications:  Current Discharge Medication List      CONTINUE these medications which have NOT CHANGED    Details   apixaban (ELIQUIS) 5 mg Tab Take 5 mg by mouth 2 times a day.      aspirin 81 MG EC tablet Take 81 mg by mouth daily.      atorvastatin (LIPITOR) 10 MG tablet Take 10 mg by mouth at bedtime.       carvedilol (COREG) 25 MG tablet Take 25 mg by mouth 2 (two) times daily.        ferrous sulfate 325 (65 FE) MG tablet Take 325 mg by mouth daily with breakfast.      furosemide (LASIX) 20 MG tablet Take 1 tablet (20 mg total) by mouth daily.  Qty: 30 tablet, Refills: 0    Associated Diagnoses: Atypical atrial flutter (CMS Dx)      glimepiride (AMARYL) 2 MG tablet Take 1 tablet by mouth  every morning before  breakfast      GLUC/CHON-MSM#1/C/MANG/BOS/BOR (OSTEO BI-FLEX ORAL) Take by mouth. OSTEO BI-FLEX JOINT SHIELD TABS       glyBURIDE-metformin (GLUCOVANCE) 2.5-500 mg per tablet Take 2  tablets by mouth 2 times a day.       levothyroxine (SYNTHROID, LEVOTHROID) 100 MCG tablet Take 100 mcg by mouth every morning.       omeprazole (PRILOSEC) 40 MG capsule Take 40 mg by mouth every morning.       propafenone (RYTHMOL) 225 MG tablet Take 225 mg by mouth 3 times a day.      tamsulosin (FLOMAX) 0.4 mg Cap Take 0.4 mg by mouth at bedtime.      valsartan (DIOVAN) 320 MG tablet Take 320 mg by mouth daily.                 Discharge Specific Orders   Discharge specific orders:  None required  Isolation     Patient Isolation Status     Isolation Added Added By Removed Removed By    Airborne / Contact / Droplet 03/27/21 Gilmer Mor, PA            Vitals     Patient Vitals for the past 4 hrs:   BP Temp Temp src Pulse Resp SpO2   03/31/21  1119 125/64 98.3 ??F (36.8 ??C) Oral 67 16 97 %   03/31/21 0745 167/78 97.6 ??F (36.4 ??C) Oral 91 16 94 %       Equipment/Supplies   Rolling Walker  The patient will need the following test completed on: 03/27/2021                 1. Isolation cart            Diagnosis:            Authorizing Provider: Ezzard Standing, DO     Ordering Physician: NPI  Emilio Aspen, MD]    Physician Certification   Further, I certify that my clinical findings support that this patient is homebound (i.e. absences from home require considerable and taxing effort and are for medical reasons or religious services or infrequently or short duration when for other reasons) due to deconditioning it would be a taxing effort to receive outpatient services.    My signature below is to certify that this patient is under my care and that I, or nurse practitioner, or a physician assistant working with me, had a face-to-face encounter with this is patient on: 03/31/2021     Follow-up Appointments and [facility] Discharge Physician Name     Future Appointments   Date Time Provider Department Center   06/30/2021 10:45 AM Garret Reddish [place], [place] [facility] [address]     [facility]  [address]  [place] [address]  [phone]          Discharging Physician Signature and Credentials   Discharging Physician: Electronically signed by Anibal Henderson Blue Winther  03/31/2021, 11:37 AM    Physician to follow up Information   PCP: Marcello Fennel, MD  PCP address: [address] / [place] [address]  PCP phone number: [phone]  PCP fax number: [phone]    If PCP is not following patient, type physician contact information here:           Patient will be followed by PCP    Discharge Planner and Credentials     Provider/Company Name and Contact Number:   Skilled Nursing Facility Name: [facility]  SNF Number: [phone]

## 2021-03-31 NOTE — Unmapped (Signed)
[place]    Case Manager/Social Worker Discharge Summary     Patient name: [name]                                        Patient MRN: [id number]  DOB: [birth year]                              Age: 80 y.o.              Gender: male  Patient emergency contact: Extended Emergency Contact Information  Primary Emergency Contact: Riggins,[name]  Address: [address] [place], [place] [address] [place]  Home Phone: [phone]  Relation: Spouse  Preferred language: English  Interpreter needed? No      Attending provider: Emilio Aspen, MD  Primary care physician: Marcello Fennel, MD    The MD has indicated that the patient is ready for discharge.  [name] was referred and accepted at [facility] for [facility]    Transfer Mode/Level of Care: Family    PASRR/HENS 7000 Completed: N/A    DC Summary and COC have been sent via Epic; Darl Pikes aware of DC today..     The plan has been reviewed:     Patient/Family Informed of Discharge Plan: Yes    Plan Reviewed With Patient, Family, or Significant Other: Yes    Patient and or family are aware and in agreement with the discharge plan: Yes             Plan reviewed with MD and other members of the health care team: Yes  Care Plan Completed: Yes    No further CM/SW needs.    This plan has been reviewed with the multi-disciplinary team.     Treatment Preferences         Post-Discharge Goals    Patient's Post-Discharge goals: will need a little rehab    Post Acute Care Provider Information:  Advertising copywriter at Discharge  Community Services at [facility] post discharge: Home Health Care  Home Health Care Name/Phone # post discharge: [facility] [phone]  Home Health Services Types at Discharge: PT/OT/SLP, Skilled Nursing    Nettie Elm RN, [place]  Ascom [phone]

## 2021-03-31 NOTE — Unmapped (Signed)
[place]  DEPARTMENT OF INTERNAL MEDICINE  DAILY PROGRESS NOTE    Chief Complaint / Reason for Follow-Up     [name] is a 80 y.o. male on hospital day 0.  The principal reason for today's follow up visit is <principal problem not specified>.    Interval History / Subjective     Reports feeling better overall. Just worked w/ therapy.    Review of Systems     No cp or sob.    Medications     Scheduled Meds:  ??? apixaban  5 mg Oral BID   ??? ascorbic acid (vitamin C)  1,000 mg Oral BID   ??? aspirin  81 mg Oral Daily with breakfast   ??? atorvastatin  10 mg Oral Nightly (2100)   ??? carvediloL  25 mg Oral BID   ??? ferrous sulfate  325 mg Oral Daily with breakfast   ??? insulin glargine  5 Units Subcutaneous Nightly (2100)   ??? insulin lispro  0-10 Units Subcutaneous Nightly (2100)   ??? insulin lispro  0-12 Units Subcutaneous TID AC   ??? levothyroxine  100 mcg Oral QAM   ??? pantoprazole  40 mg Oral DAILY 0600   ??? propafenone  225 mg Oral TID   ??? tamsulosin  0.4 mg Oral Nightly (2100)   ??? valsartan  320 mg Oral Daily 0900   ??? zinc sulfate  220 mg Oral Daily 0900     Continuous Infusions:  PRN Meds:acetaminophen, bisacodyL, dextrose 50 % in water (D50W) **OR** dextrose 50 % in water (D50W), glucose, ondansetron, polyethylene glycol    Vital Signs     Temp:  [97.6 ??F (36.4 ??C)-98.3 ??F (36.8 ??C)] 98.3 ??F (36.8 ??C)  Heart Rate:  [67-100] 67  Resp:  [16] 16  BP: (125-167)/(64-84) 125/64    Intake/Output Summary (Last 24 hours) at 03/31/2021 1309  Last data filed at 03/31/2021 0745  Gross per 24 hour   Intake 480 ml   Output 1150 ml   Net -670 ml       Physical Exam     Gen: NAD  CV: RRR  Chest: Cta  Ext: No edema  Neuro: A&OX 3, non focal    Laboratory Data         Lab 03/30/21  0613 03/29/21  0536 03/28/21  0506 03/27/21  1340   WBC 9.9 9.6 11.7* 15.1*   HEMOGLOBIN 11.1* 10.6* 10.7* 11.4*   HEMATOCRIT 33.5* 31.9* 32.6* 34.9*   MEAN CORPUSCULAR VOLUME 83.3 83.4 83.7 83.3   PLATELETS 179 172 178 186         Lab 03/30/21  0613  03/29/21  0536 03/28/21  0506 03/27/21  1340   SODIUM 135 134 132* 132*   POTASSIUM 3.9 4.1 4.2 4.9   CHLORIDE 101 100 97* 97*   CO2 27 29 27 28    BUN 18 18 21 22    CREATININE 0.80 0.94 1.12 1.13   GLUCOSE 139* 210* 224* 247*   CALCIUM 7.7* 7.9* 8.2* 9.0             Lab 03/27/21  2358   ALT 13   AST 12*   ALK PHOS 78   BILIRUBIN TOTAL 1.2   BILIRUBIN DIRECT 0.4   ALBUMIN 3.4*         Lab 03/27/21  1408   COLOR, URINE Yellow   CLARITY Clear   PROTEIN UA 100 mg/dL*   PH UA 5.5   SPECIFIC GRAVITY, URINE >=  1.030   GLUCOSE UA >=1000 mg/dL*   BLOOD UA Trace-lysed*   LEUKOCYTES UA Negative   NITRITE UA Negative   BILIRUBIN UA Negative   UROBILINOGEN UA 2.0 E.U./dL   RBC UA 2       Assessment & Plan     [name] is a 80 y.o. male on HD# 0 with <principal problem not specified>.  The medical issues being addressed in today's encounter are as follows:    Covid   Not hypoxemic.  Was given remdesivir??x 3 days- dc.  No need for steroids or o2.  ??  Weakness  Secondary to covid  PT/OT evaluation- snf vs ipr.  ??  S/p decompression of the lumbar spine??03/23/21  Synovial cyst removed  Eliquis and Asprin resumed.  ??  HTN  Continue coreg and valsartan  ??  Cardiomyopathy  Not decompensated.  ??  Hx atrial fibrillation  On eliquis.  ??  OSA  Uses CPAP  ??  DM2, uncontrolled  Accu checks/ ssi/ lantus in house.    Nutrition:   Diet Orders          Diet consistent carb 45-60g/meal; cardiac=low fat, salt, cholesterol starting at 12/19 0727          Code Status: Full Code    Signed:  Cyd Silence, MD  03/31/2021, 1:09 PM

## 2021-04-06 MED ORDER — GLIMEPIRIDE 2 MG PO TABS
2 MG | ORAL_TABLET | ORAL | 3 refills | Status: AC
Start: 2021-04-06 — End: 2022-03-21

## 2021-04-06 MED ORDER — SITAGLIPTIN PHOSPHATE 100 MG PO TABS
100 MG | ORAL_TABLET | ORAL | 3 refills | Status: AC
Start: 2021-04-06 — End: 2022-04-20

## 2021-04-06 MED ORDER — METFORMIN HCL 500 MG PO TABS
500 MG | ORAL_TABLET | ORAL | 0 refills | Status: DC
Start: 2021-04-06 — End: 2021-06-21

## 2021-04-06 NOTE — Telephone Encounter (Signed)
Medication:   Requested Prescriptions     Pending Prescriptions Disp Refills    metFORMIN (GLUCOPHAGE) 500 MG tablet [Pharmacy Med Name: METFORMIN TAB 500MG ] 360 tablet 0     Sig: TAKE 2 TABLETS 2 TIMES     DAILY WITH MEALS    SITagliptin (JANUVIA) 100 MG tablet [Pharmacy Med Name: JANUVIA TAB 100MG ] 90 tablet 3     Sig: TAKE 1 TABLET DAILY    glimepiride (AMARYL) 2 MG tablet [Pharmacy Med Name: GLIMEPIRIDE  TAB 2MG ] 180 tablet 3     Sig: TAKE 1 TABLET TWICE DAILY  WITH MEALS     Last Filled:  12/24/2020    Last appt: 02/22/2021   Next appt: Visit date not found    Last OAR[place]:   RX Monitoring 03/24/2021   Periodic Controlled Substance Monitoring Possible medication side effects, risk of tolerance/dependence & alternative treatments discussed.;No signs of potential drug abuse or diversion identified.

## 2021-04-06 NOTE — Telephone Encounter (Signed)
Ok w me.

## 2021-04-06 NOTE — Telephone Encounter (Signed)
Carl Blair, physical therapist from [facility], regarding pt evaluation has been completed and the frequency is one time a week for one week, two times a week for one week, and then one time a week for one week and it is effective 04/05/21.

## 2021-04-06 NOTE — Telephone Encounter (Signed)
Lmtcb.

## 2021-04-23 NOTE — Telephone Encounter (Signed)
Spoke with Rodney Booze ,informed physician advise

## 2021-04-23 NOTE — Telephone Encounter (Signed)
Continue to monitor BP over the weekend. If they continue to be persistently high, I want him to come in for an appointment next week.

## 2021-04-23 NOTE — Telephone Encounter (Signed)
Carl Blair calling - [facility]  Tasha ph: [phone]    Reporting elevated BP  BP 8:30 this morning - 165/87   BP 9:45 his morning  157/93  his morning pulse 113    Patient took medication @ 7:30 am this morning

## 2021-04-27 ENCOUNTER — Ambulatory Visit: Admit: 2021-04-27 | Discharge: 2021-05-05 | Payer: MEDICARE | Attending: Internal Medicine | Primary: Internal Medicine

## 2021-04-27 LAB — CBC WITH AUTO DIFFERENTIAL
Basophils %: 0.3 %
Basophils Absolute: 0 10*3/uL (ref 0.0–0.2)
Eosinophils %: 4.4 %
Eosinophils Absolute: 0.5 10*3/uL (ref 0.0–0.6)
Hematocrit: 37.7 % — ABNORMAL LOW (ref 40.5–52.5)
Hemoglobin: 12 g/dL — ABNORMAL LOW (ref 13.5–17.5)
Lymphocytes %: 39.9 %
Lymphocytes Absolute: 4.4 10*3/uL (ref 1.0–5.1)
MCH: 26.8 pg (ref 26.0–34.0)
MCHC: 31.8 g/dL (ref 31.0–36.0)
MCV: 84.2 fL (ref 80.0–100.0)
MPV: 7.2 fL (ref 5.0–10.5)
Monocytes %: 7.8 %
Monocytes Absolute: 0.9 10*3/uL (ref 0.0–1.3)
Neutrophils %: 47.6 %
Neutrophils Absolute: 5.2 10*3/uL (ref 1.7–7.7)
Platelets: 183 10*3/uL (ref 135–450)
RBC: 4.48 M/uL (ref 4.20–5.90)
RDW: 16.3 % — ABNORMAL HIGH (ref 12.4–15.4)
WBC: 11 10*3/uL (ref 4.0–11.0)

## 2021-04-27 MED ORDER — ACCU-CHEK AVIVA PLUS VI STRP
ORAL_STRIP | 5 refills | Status: DC
Start: 2021-04-27 — End: 2021-10-25

## 2021-04-27 MED ORDER — ACCU-CHEK FASTCLIX LANCETS MISC
1 refills | Status: DC
Start: 2021-04-27 — End: 2021-10-25

## 2021-04-27 NOTE — Addendum Note (Signed)
Addended by: Vickey Huger on: 04/27/2021 04:16 PM     Modules accepted: Orders

## 2021-04-27 NOTE — Assessment & Plan Note (Signed)
On eliquis,  Patient is compliant w medications, no side effects, effective, provides adequate symptom relief. No new symptoms or problems as noted by patient.  The problem is stable, no changes noted by patient. Will consider monitoring labs and refill medications as appropriate. Patient counseled and will continue current plan.

## 2021-04-27 NOTE — Assessment & Plan Note (Signed)
Patient counseled,   Encouraged to lose weight, watch diet and exercise consistently.

## 2021-04-27 NOTE — Assessment & Plan Note (Signed)
On diuretics,  Patient is compliant w medications, no side effects, effective, provides adequate symptom relief. No new symptoms or problems as noted by patient.  The problem is stable, no changes noted by patient. Will consider monitoring labs and refill medications as appropriate. Patient counseled and will continue current plan.

## 2021-04-27 NOTE — Assessment & Plan Note (Signed)
Diabetes Mellitus Type II:  Home blood sugar records reviewed: fasting range: 120-130.  No significant episodes of hypoglycemia. Compliant with medications.  No polyuria, polydipsia, visual changes, foot problems, GI upset.  Diabetic diet compliance:  compliant most of the time.  Current exercise: none. Will check labs, and refill medications as appropriate.    Hypertension:  Denies CP, SOB, visual changes, dizziness, palpitations or HA.  He is adherent to a low sodium diet.  Blood pressure typically runs ok m outside of the office. Continue current medications.    Hyperlipidemia:  No new myalgias or GI upset on current medications.       Lab Results   Component Value Date    LABA1C 8.7 (H) 03/28/2021    LABA1C 8.8 01/01/2021    LABA1C 8.1 03/27/2020     Lab Results   Component Value Date    LABMICR 4.60 (H) 03/27/2020    CREATININE 0.80 03/30/2021     Lab Results   Component Value Date    ALT 13 03/27/2021    AST 12 (L) 03/27/2021     No components found for: CHLPL  Lab Results   Component Value Date    TRIG 108 03/27/2020     Lab Results   Component Value Date    HDL 31 (L) 03/27/2020     Lab Results   Component Value Date/Time    LDLCALC 97 03/27/2020 10:02 AM       This problem is stable, reviewed in detail, and advised patient to continue the current instructions or medications. Will continue to closely monitor the situation.

## 2021-04-27 NOTE — Progress Notes (Signed)
Subjective:      Patient ID: Carl Blair is a 81 y.o. male.    HPI  Established patient here for a visit to manage acute and chronic medical conditions as detailed below.    Type 2 diabetes mellitus without complication, without long-term current use of insulin (HCC)  Diabetes Mellitus Type II:  Home blood sugar records reviewed: fasting range: 120-130.  No significant episodes of hypoglycemia. Compliant with medications.  No polyuria, polydipsia, visual changes, foot problems, GI upset.  Diabetic diet compliance:  compliant most of the time.  Current exercise: none. Will check labs, and refill medications as appropriate.    Hypertension:  Denies CP, SOB, visual changes, dizziness, palpitations or HA.  He is adherent to a low sodium diet.  Blood pressure typically runs ok m outside of the office. Continue current medications.    Hyperlipidemia:  No new myalgias or GI upset on current medications.       Lab Results   Component Value Date    LABA1C 8.7 (H) 03/28/2021    LABA1C 8.8 01/01/2021    LABA1C 8.1 03/27/2020     Lab Results   Component Value Date    LABMICR 4.60 (H) 03/27/2020    CREATININE 0.80 03/30/2021     Lab Results   Component Value Date    ALT 13 03/27/2021    AST 12 (L) 03/27/2021     No components found for: CHLPL  Lab Results   Component Value Date    TRIG 108 03/27/2020     Lab Results   Component Value Date    HDL 31 (L) 03/27/2020     Lab Results   Component Value Date/Time    LDLCALC 97 03/27/2020 10:02 AM       This problem is stable, reviewed in detail, and advised patient to continue the current instructions or medications. Will continue to closely monitor the situation.      Morbid obesity due to excess calories [facility])  Patient counseled,   Encouraged to lose weight, watch diet and exercise consistently.       Chronic systolic congestive heart failure (HCC)  On diuretics,  Patient is compliant w medications, no side effects, effective, provides adequate symptom relief. No new symptoms or  problems as noted by patient.  The problem is stable, no changes noted by patient. Will consider monitoring labs and refill medications as appropriate. Patient counseled and will continue current plan.      Chronic a-fib (HCC)  On eliquis,  Patient is compliant w medications, no side effects, effective, provides adequate symptom relief. No new symptoms or problems as noted by patient.  The problem is stable, no changes noted by patient. Will consider monitoring labs and refill medications as appropriate. Patient counseled and will continue current plan.      Acquired hypothyroidism  On synthroid,  Patient is compliant w medications, no side effects, effective, provides adequate symptom relief. No new symptoms or problems as noted by patient.  The problem is stable, no changes noted by patient. Will consider monitoring labs and refill medications as appropriate. Patient counseled and will continue current plan.     Review of Systems  ROS: No unusual headaches or allergy symptoms or blurred vision. No prolonged cough. No flushing or facial pain or chest pain,dizziness, dyspnea, palpitations, or chest pain on exertion. No syncope. No nausea or vommitting or diarrhea.  No jaundice or abdominal pain, change in bowel habits, black or bloody stools.  No dysuria  or hematuria or frequency of urination.   No myalgias or muscle pain.  No numbness, weakness, or tingling. No falls, or loss of consciousness. No weight loss or back pain. No falls.  No paresthesias. No joint swelling or redness. No joint pain. No recent weight loss. No focal weakness or sensory deficits or paresthesias, No confusion or altered sensorium. No hematemesis. No hearing loss. No siezures. All other systems were reviewed, and review was negative.   Objective:   Physical Exam  BP (!) 140/90    Pulse 84    Temp 97.1 ??F (36.2 ??C) (Temporal)    SpO2 98%    The physical exam reveals a patient who appears well, alert and oriented x 3, pleasant, cooperative.  Vitals are as noted. Head is atraumatic and normocephalic. Eyes reveal normal conjunctiva, cornea normal, pupils are equal and rective to light. Nasal mucosa is normal. Throat is normal without exudates. Ears reveal normal tympanic membranes.Neck is supple and free of adenopathy, or masses. No thyromegaly. No jugular venous distension. Lungs are clear to auscultation, no rales or rhonchi noted. Heart sounds are regular , no murmurs, clicks, gallops or rubs. Abdomen is soft, no tenderness, masses or organomegaly. Bowel sounds are normally heard.Pelvis: normal. Extremities are normal. Peripheral pulses are normal. Screening neurological exam is normal without focal findings. Cranial nerves are intact, reflexes are symmetrical and muscle strength eaqual. Skin is normal without suspicious lesions noted.   Assessment:      Type 2 diabetes mellitus without complication, without long-term current use of insulin (HCC)  Diabetes Mellitus Type II:  Home blood sugar records reviewed: fasting range: 120-130.  No significant episodes of hypoglycemia. Compliant with medications.  No polyuria, polydipsia, visual changes, foot problems, GI upset.  Diabetic diet compliance:  compliant most of the time.  Current exercise: none. Will check labs, and refill medications as appropriate.    Hypertension:  Denies CP, SOB, visual changes, dizziness, palpitations or HA.  He is adherent to a low sodium diet.  Blood pressure typically runs ok m outside of the office. Continue current medications.    Hyperlipidemia:  No new myalgias or GI upset on current medications.       Lab Results   Component Value Date    LABA1C 8.7 (H) 03/28/2021    LABA1C 8.8 01/01/2021    LABA1C 8.1 03/27/2020     Lab Results   Component Value Date    LABMICR 4.60 (H) 03/27/2020    CREATININE 0.80 03/30/2021     Lab Results   Component Value Date    ALT 13 03/27/2021    AST 12 (L) 03/27/2021     No components found for: CHLPL  Lab Results   Component Value Date    TRIG  108 03/27/2020     Lab Results   Component Value Date    HDL 31 (L) 03/27/2020     Lab Results   Component Value Date/Time    LDLCALC 97 03/27/2020 10:02 AM       This problem is stable, reviewed in detail, and advised patient to continue the current instructions or medications. Will continue to closely monitor the situation.      Morbid obesity due to excess calories [facility])  Patient counseled,   Encouraged to lose weight, watch diet and exercise consistently.       Chronic systolic congestive heart failure (HCC)  On diuretics,  Patient is compliant w medications, no side effects, effective, provides adequate symptom relief. No new  symptoms or problems as noted by patient.  The problem is stable, no changes noted by patient. Will consider monitoring labs and refill medications as appropriate. Patient counseled and will continue current plan.      Chronic a-fib (HCC)  On eliquis,  Patient is compliant w medications, no side effects, effective, provides adequate symptom relief. No new symptoms or problems as noted by patient.  The problem is stable, no changes noted by patient. Will consider monitoring labs and refill medications as appropriate. Patient counseled and will continue current plan.      Acquired hypothyroidism  On synthroid,  Patient is compliant w medications, no side effects, effective, provides adequate symptom relief. No new symptoms or problems as noted by patient.  The problem is stable, no changes noted by patient. Will consider monitoring labs and refill medications as appropriate. Patient counseled and will continue current plan.         Plan:      Labs ordered, reviewed.   Medications refilled.   All Health maintenance needs reviewed and the needful ordered.           Marcello FennelPrashanth R Rheana Casebolt, MD

## 2021-04-27 NOTE — Assessment & Plan Note (Signed)
On synthroid,  Patient is compliant w medications, no side effects, effective, provides adequate symptom relief. No new symptoms or problems as noted by patient.  The problem is stable, no changes noted by patient. Will consider monitoring labs and refill medications as appropriate. Patient counseled and will continue current plan.

## 2021-04-28 LAB — HEMOGLOBIN A1C
Hemoglobin A1C: 7.5 %
eAG: 168.6 mg/dL

## 2021-04-28 LAB — COMPREHENSIVE METABOLIC PANEL
ALT: 9 U/L — ABNORMAL LOW (ref 10–40)
AST: 11 U/L — ABNORMAL LOW (ref 15–37)
Albumin/Globulin Ratio: 2.1 (ref 1.1–2.2)
Albumin: 4.1 g/dL (ref 3.4–5.0)
Alkaline Phosphatase: 96 U/L (ref 40–129)
Anion Gap: 10 (ref 3–16)
BUN: 18 mg/dL (ref 7–20)
CO2: 28 mmol/L (ref 21–32)
Calcium: 8.9 mg/dL (ref 8.3–10.6)
Chloride: 99 mmol/L (ref 99–110)
Creatinine: 0.9 mg/dL (ref 0.8–1.3)
Est, Glom Filt Rate: >60 (ref >60)
Glucose: 120 mg/dL — ABNORMAL HIGH (ref 70–99)
Potassium: 4.8 mmol/L (ref 3.5–5.1)
Sodium: 137 mmol/L (ref 136–145)
Total Bilirubin: 0.5 mg/dL (ref 0.0–1.0)
Total Protein: 6.1 g/dL — ABNORMAL LOW (ref 6.4–8.2)

## 2021-04-28 LAB — LIPID PANEL
Cholesterol, Total: 144 mg/dL (ref 0–199)
HDL: 33 mg/dL — ABNORMAL LOW (ref 40–60)
LDL Calculated: 91 mg/dL (ref <100)
Triglycerides: 100 mg/dL (ref 0–150)
VLDL Cholesterol Calculated: 20 mg/dL

## 2021-04-28 LAB — PSA SCREENING: PSA: 0.59 ng/mL (ref 0.00–4.00)

## 2021-04-28 LAB — TSH WITH REFLEX: TSH: 3.34 u[IU]/mL (ref 0.27–4.20)

## 2021-06-02 ENCOUNTER — Ambulatory Visit: Payer: Medicare (Managed Care) | Attending: Acute Care

## 2021-06-21 MED ORDER — LEVOTHYROXINE SODIUM 100 MCG PO TABS
100 MCG | ORAL_TABLET | ORAL | 3 refills | Status: AC
Start: 2021-06-21 — End: 2022-04-20

## 2021-06-21 MED ORDER — METFORMIN HCL 500 MG PO TABS
500 MG | ORAL_TABLET | ORAL | 0 refills | Status: AC
Start: 2021-06-21 — End: ?

## 2021-06-21 MED ORDER — OMEPRAZOLE 40 MG PO CPDR
40 MG | ORAL_CAPSULE | ORAL | 3 refills | Status: DC
Start: 2021-06-21 — End: 2022-08-16

## 2021-06-21 NOTE — Progress Notes (Unsigned)
Post-Operative Visit  Carl Blair is a 81 year old male who is here today for a post-operative   evaluation visit after a lumbar / sacral discectomy/decompression - no   instrumentation procedure.  It has been 3 months since his surgery.  He is   taking no post-operative pain medication.  Post-op treatment has been a   walking program.  His activity level is Returned to full activity.  His   symptoms are better.  There was no post-operative pain.  His Oswestry score   today is 2.            Musculoskeletal   RLE muscle strength: 5/5 throughout  RLE muscle tone: normal  LLE muscle strength: 5/5 throughout  LLE muscle tone: normal        Incision   Evaluated lumbar incision.   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  His pre-op claudicating pain is   resolved.  He is very pleased with his outcome.  His only concern is he does   continue to walk with a forward flexed gait.  He has been trying to work on   walking straight but still has some difficulty.      Plan   New Orders:  Dr. Gillian Shields PT - Lumbar POST OP [[id number]]  Additional Plan Details: I recommend a course of outpatient physical therapy.    From a surgical standpoint he is doing very well and I will make follow up on   an as-needed basis.  Disposition: follow-up as needed              Electronically Signed by Arby Barrette NP on 06/21/2021 at 1:28 PM  ________________________________________________________________________

## 2021-06-21 NOTE — Telephone Encounter (Signed)
Medication:   Requested Prescriptions     Pending Prescriptions Disp Refills    omeprazole (PRILOSEC) 40 MG delayed release capsule [Pharmacy Med Name: OMEPRAZOLE CAP 40MG ] 90 capsule 3     Sig: TAKE 1 CAPSULE DAILY    levothyroxine (SYNTHROID) 100 MCG tablet [Pharmacy Med Name: SYNTHROID TAB 100MCG] 90 tablet 3     Sig: TAKE 1 TABLET DAILY    metFORMIN (GLUCOPHAGE) 500 MG tablet [Pharmacy Med Name: METFORMIN TAB 500MG ] 360 tablet 0     Sig: TAKE 2 TABLETS 2 TIMES     DAILY WITH MEALS       Last Filled:      Patient Phone Number: [phone] (home)     Last appt: 04/27/2021   Next appt: Visit date not found    Last Thyroid:   Lab Results   Component Value Date/Time    TSH 4.29 03/27/2020 10:02 AM    T4FREE 1.7 04/14/2017 11:22 AM       Last OARRS:   RX Monitoring 03/24/2021   Periodic Controlled Substance Monitoring Possible medication side effects, risk of tolerance/dependence & alternative treatments discussed.;No signs of potential drug abuse or diversion identified.       Preferred Pharmacy:   [facility] [address], [place] - [address] - [address] [place], [address], [address] - F [phone]  [address]  [address], [address] [place]  Phone: [phone] Fax: [phone]    [facility] [phone] - [place], [id number] - [place] [address] - P [phone] - F [phone]  [address]  [place] [phone] [place]  Phone: [phone] Fax: [phone]:   Requested Prescriptions     Pending Prescriptions Disp Refills    omeprazole (PRILOSEC) 40 MG delayed release capsule [Pharmacy Med Name: OMEPRAZOLE CAP 40MG ] 90 capsule 3     Sig: TAKE 1 CAPSULE DAILY    levothyroxine (SYNTHROID) 100 MCG tablet [Pharmacy Med Name: SYNTHROID TAB 100MCG] 90 tablet 3     Sig: TAKE 1 TABLET DAILY    metFORMIN (GLUCOPHAGE) 500 MG tablet [Pharmacy Med Name: METFORMIN TAB 500MG ] 360 tablet 0     Sig: TAKE 2 TABLETS 2 TIMES     DAILY WITH MEALS       Last Filled:      Patient Phone Number: [phone] (home)     Last  appt: 04/27/2021   Next appt: Visit date not found    Last Thyroid:   Lab Results   Component Value Date/Time    TSH 4.29 03/27/2020 10:02 AM    T4FREE 1.7 04/14/2017 11:22 AM       Last OARRS:   RX Monitoring 03/24/2021   Periodic Controlled Substance Monitoring Possible medication side effects, risk of tolerance/dependence & alternative treatments discussed.;No signs of potential drug abuse or diversion identified.       Preferred Pharmacy:   [facility] [address], [place] - [address] - 03/26/2021 [facility] - F [phone]  [address]  [address] [place] [phone]  Phone: [phone] Fax: [phone]    [facility] [id number] - [place], [phone] - [facility] [address] - P [phone] - F [phone]  [address]  [place] [phone]  Phone: [phone] Fax: [phone]

## 2021-06-30 ENCOUNTER — Ambulatory Visit: Admit: 2021-06-30 | Discharge: 2021-06-30 | Payer: Medicare (Managed Care) | Attending: Acute Care

## 2021-06-30 DIAGNOSIS — R063 Periodic breathing: Secondary | ICD-10-CM

## 2021-06-30 NOTE — Unmapped (Signed)
CLEANING INSTRUCTIONS FOR PAP EQUIPMENT     Keeping your equipment and supplies clean is very important.     REMINDER: Only use DISTILLED WATER in your humidifier, Empty water daily.    DAILY  Mask and tubing:   Wash your face before applying mask  Wash mask and tubing with baby shampoo and warm water.     Humidifier:   Empty water in reservoir  Clean with baby shampoo and warm water  Rinse, then air dry    WEEKLY  Mask and tubing:   Soak your mask and tubing in 1 part vinegar and 3 parts water for 30 minutes. Rinse, and allow to air dry.     Humidifier:   Wash with warm water and baby shampoo  Soak in 1 part vinegar and 3 parts water for 30 minutes  Rinse with warm water and allow to air dry    Machine Exterior:   Wipe with a clean damp cloth    MONTHLY AND/OR AS NEEDED  Reusable foam filters (black filter)- wash in warm water with baby shampoo. Rinse well and dry with paper towel  Disposable felt filter (white filter)- Replace filter every two weeks to once a month    NOTE: If you are having repeated sinus and /or respiratory infections, dirty equipment may be the cause. It may help to clean and disinfect your equipment more frequently    TRAVELING  Always make sure the humidifier is empty when traveling. (including doctor appointments, air travel or long distance driving).  When flying, always carry your PAP device with you as a carry on item, NEVER check it in as baggage. We can provide you with a letter stating it is a medical device, talk to your provider.    Always make sure you have your mask and tubing with you. You will need appropriate plug adapters when traveling outside the [place].    If travelling by air and you are unable to carry distilled water with youuse bottled water (no more than 2 weeks). DO NOT USE TAP WATER.

## 2021-06-30 NOTE — Unmapped (Signed)
NAME: [name]  MRN: [id number]  Author:Emalea Mix ANNETTE Andrews Montoya  Date:06/30/2021    Last seen 06/02/2020 by Myself    CHIEF COMPLAINT:   81 y.o. 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  He has used the ASV machine 30 out of the past 30 nights. His average nightly use is 7 hours and 52 minutes with an AHI of 6.1.  No reports of machine malfunction. He is not having any problems with the full mask or the equipment.  No complaints of skin irritation, nasal congestion or rainout. +Improved sleep comfort with therapy. + tolerating the pressure well. No reports of snoring with PAP therapy. +improved daytime energy with PAP therapy.   Bedtime: 11 PM  SLT: 10  minutes  Awakenings during the night: 1-2 to use the restroom  Wake up: 7 AM   Naps: Yes 30 minutes/daily    Mood is good    Yes Bruxism -wears a bite guard    He had Covid 03/2021. He also lost his wife in December.      PAP Data Download:  Equipment:  ResMed -  min EPAP 6, max EPAP 15, min pressure support 4, Max pressure support 15 cwp   Mask: Full  No chinstrap  Equipment download:   Data date range:05/25/2021-06/23/2021  Downloaded data shows that over 30 nights the patient has used the device 30 nights.   The average use on nights used = 7 hours 52 minutes.  The percentage of nights used more than 4 hours =100%.  The machine reported AHI is 6.1.  95th percentile leak was 34.3L/minute. IPAP Pressure: Median: 16.3, 95th percentile:21.2 and Maximum:23.6. EPAP Pressure: Median: 9, 95th percentile:12.9 and Maximum:14.1    Prior Diagnostic Testing:    CPAP/BPAP Location: [facility]  Date of CPAP/BPAP (54098): 03/09/17  CmH2O Pressure prescribed: 7/7/0/15/25  AHI at Prescribed:4.6  Lowest SpO2% at Prescribed:83%  PLMI:     PLMAI:  Weight: 257 pounds  Other:    Date of CPAP: 12/06/2008   SE %: 76.5   AHI:  1.1   Lowest p ox %: 90.0   CPAP performed at: [facility] @ time of study: 265 lbs   DME Company: [employer]   Mask type: full face   cmH2O: 13     Date of NPSG: 05/27/2003   SE %: 63.5   AHI: 52   Lowest p ox %: 73   NPSG performed at: [facility] Readings from Last 3 Encounters:   06/30/21 (!) 257 lb (116.6 kg)   03/27/21 (!) 260 lb 2.3 oz (118 kg)   06/02/20 (!) 264 lb (119.7 kg)       PAST MEDICAL HISTORY:  Surgical history:  Past Surgical History:   Procedure Laterality Date   ??? COLONOSCOPY W/ POLYPECTOMY      benign  has at 3 plus procedures   ??? HERNIA REPAIR  9/09    right inguinal, mini maze   ??? HERNIA REPAIR  2/10    herniated lungs bilateral (repaired with mesh)   ??? mini-maze      corrected his A-fib   ??? rt eye  02/2018   ??? SEPTOPLASTY     ??? UVULOPALATOPHARYNGOPLASTY     ??? UVULOPALATOPHARYNGOPLASTY      has had  esophageal dilations in the past (Dr.Decter)   ??? WISDOM TOOTH EXTRACTION         Medical history:  Past Medical History:   Diagnosis Date   ??? Atrial fibrillation (CMS-HCC)     corrected with Mini-maze procedure   ??? Cataract     left eye   ??? Diabetes mellitus (CMS-HCC)     type 2   ??? Dilated cardiomyopathy (CMS-HCC)    ??? Hearing loss     left ear   ??? Hypercholesteremia    ??? Hypertension    ??? Sleep apnea    ??? Thyroid disease     hypothyroidism       Medications:  Current Outpatient Medications   Medication Sig   ??? apixaban Take 1 tablet (5 mg total) by mouth 2 times a day.   ??? aspirin Take 1 tablet (81 mg total) by mouth daily.   ??? atorvastatin Take 1 tablet (10 mg total) by mouth at bedtime.   ??? carvediloL Take 1 tablet (25 mg total) by mouth 2 times a day.   ??? ferrous sulfate Take 1 tablet (325 mg total) by mouth daily with breakfast.   ??? furosemide Take 1 tablet (20 mg total) by mouth daily.   ??? glimepiride Take 1 tablet by mouth  every morning before  breakfast   ??? GLUC/CHON-MSM#1/C/MANG/BOS/BOR (OSTEO BI-FLEX ORAL) Take by mouth. OSTEO BI-FLEX JOINT SHIELD TABS    ??? glyBURIDE-metformin Take 2  tablets by mouth 2 times a day.   ??? levothyroxine Take 1 tablet (100 mcg total) by mouth every morning.   ??? omeprazole Take 1 capsule (40 mg total) by mouth every morning.   ??? propafenone Take 1 tablet (225 mg total) by mouth 3 times a day.   ??? tamsulosin Take 1 capsule (0.4 mg total) by mouth at bedtime.   ??? valsartan Take 1 tablet (320 mg total) by mouth daily.     No current facility-administered medications for this visit.         Allergies:Patient has no known allergies.    Family history:  Family History   Problem Relation Age of Onset   ??? Diabetes Mother         type 2   ??? Hypertension Mother    ??? Coronary artery disease Sister    ??? Diabetes Sister         type 2   ??? Coronary artery disease Sister    ??? Coronary artery disease Brother    ??? Hypertension Brother    ??? Diabetes Brother         type 2   ??? Hypertension Brother        Social history:  Social History     Socioeconomic History   ??? Marital status: Married     Spouse name: None   ??? Number of children: None   ??? Years of education: None   ??? Highest education level: None   Occupational History   ??? None   Tobacco Use   ??? Smoking status: Never   ??? Smokeless tobacco: Never   ??? Tobacco comments:     03/19/07   Substance and Sexual Activity   ??? Alcohol use: No   ??? Drug use: No     Comment: 04-14-11   ??? Sexual activity: None   Other Topics Concern   ??? Caffeine Use No   ??? Occupational Exposure Yes     Comment: retired Energy manager   ??? Exercise  Yes   ??? Seat Belt Yes   Social History Narrative   ??? None     Social Determinants of Health     Financial Resource Strain: Not on file   Physical Activity: Not on file   Stress: Not on file   Social Connections: Not on file   Housing Stability: Not on file       Review of Systems    Constitutional: Negative.  Negative for fatigue.   HENT: Negative.  Negative for congestion, dry mouth and sore throat.    Cardiovascular: Negative.  Negative for chest pain, palpitations and leg swelling.   Psychiatric/Behavioral: Negative.   Negative for decreased concentration and depression. The patient is not anxious.    Gastrointestinal: Negative.  Negative for heartburn.   Genitourinary: Positive for Awaken to urinate. Negative for Unable to control urine, frequency and urgency.   Musculoskeletal: Negative for arthralgias, back pain and myalgias.   Skin: Negative.  Negative for rash and wound.   Neurological: Negative.  Negative for dizziness, headaches and waking up with a headache.   Respiratory: Negative.  Negative for cough, shortness of breath and wheezing.        I have reviewed the review of system and advised patient to contact primary care physician or specialist for non sleep related issues.    ROS  has been reviewed and updated with the patient during this visit.    PHYSICAL EXAMINATION:  BP 136/82 (BP Location: Left arm, Patient Position: Sitting, BP Cuff Size: Regular)    Pulse 76    Resp 16    Ht 6' (1.829 m)    Wt (!) 257 lb (116.6 kg)    SpO2 97%    BMI 34.86 kg/m??   GENERAL:  Well developed well-nourished male +obese Body mass index is 34.86 kg/m??. in NAD and good historian.  SKIN: No obvious rashes. Warm and dry.  HEAD: Normocephalic and atraumatic. +Facial hair.+wearing a facial mask  EYES:    Pupils equal, reactive and conjugate                 Sclera are nonicteric  Ears:  Ears appear normal externally bilateral   Throat: Thyroid is not palpable           Cervical Lymphadenopathy not present            Mallampati-IV. Oropharynx is moist and pink.  No oropharyngeal exudate or posterior oropharyngeal erythema.   Lungs:  Clear, without wheezes rales or rhonchi. No stridor. No respiratory distress. Chest wall normal  CV:  Normal S1 S2, Regular rate and rhythm. No murmurs, rubs or gallops.    MS:  Without cyanosis or clubbing. No peripheral edema   NEURO:  Alert and oriented; Normal gait   PSYCH:  Well groomed, calm, pleasant and appropriate. Insight and judgment appear good    LABORATORY TESTS:    Lab Results   Component Value  Date    IRON 16 (L) 05/24/2019    TIBC 390 05/24/2019     Lab Results   Component Value Date    TSH 3.63 11/09/2019    FREET4 1.21 11/09/2019     Lab Results   Component Value Date    WBC 9.9 03/30/2021    HGB 11.1 (L) 03/30/2021    HCT 33.5 (L) 03/30/2021    MCV 83.3 03/30/2021    PLT 179 03/30/2021     No components found for: HBA1C  Lab Results   Component Value Date  GLUCOSE 139 (H) 03/30/2021    BUN 18 03/30/2021    CO2 27 03/30/2021    K 3.9 03/30/2021    NA 135 03/30/2021    CL 101 03/30/2021    CALCIUM 7.7 (L) 03/30/2021     No components found for: LASTABGRESULT     Lab results reviewed.  Patient will follow-up with ordering provider for abnormal labs.    EPWORTH SLEEPINESS SCALE: (at or above 10 is abnormal and 24 is the maximum score)      02/21/2017     1:00 PM 03/16/2017     5:00 PM 06/19/2017     4:00 PM 04/23/2018     3:00 PM 04/25/2019     3:40 PM 06/02/2020    10:53 AM 06/30/2021    11:02 AM   Epworth Sleepiness Scale   Sitting and reading 1 0 1 1 0 1 1   Watching TV 2 1 1 1  0 1 2   Sitting, inactive in a public place (e.g. a theatre or a meeting) 0 1 1 0 0 1 0   As a passenger in a car for an hour without a break 1 0 0 0 0 0 1   Lying down to rest in the afternoon when circumstances permit 2 1 1 1 3 1 2    Sitting and talking to someone 0 0 0 0 0 1 0   Sitting quietly after a lunch without alcohol 0 0 0 0 0 1 0   In a car, while stopped for a few minutes in traffic 0 0 0 0 0 0 0   Total score 6 3 4 3 3 6 6        Assessment    Assessment/plan:  1. 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  Continues to have positive benefits using PAP therapy.  Machine compliance data reviewed and discussed with patient.  The AHI is 6.1.  I suspect AHI includes central and obstructive events.  This level is in acceptable range.  He uses the device all hours of sleep.  His nightly average use time is greater than 7.5 hours.  No pressure or mask complaints.    Recommendations are to continue regular Pap therapy with  his current pressure settings.    Disposable Pap equipment order updated.  He will contact the supply company as needed.    Cardiovascular health risks associated with obstructive sleep apnea and the importance of continued therapy reviewed.    Proper handwashing cleaning instructions provided on discharge summary.    2. Obesity  Body mass index is 34.86 kg/m??.  7 pound weight loss over the last year.    Exercise as tolerated encouraged.    Condolences were provided for the loss of his wife.     Last PCP note from 04/27/2021 reviewed.    History of diabetes mellitus, hearing loss, hypertension, atrial fibrillation, cataracts, dilated cardiomyopathy, hypercholesterolemia and thyroid disease.    Follow-up appointment:Return visit in 12 months.         A copy of my Plan and Assessment will be sent to: Marcello Fennel    Medical Decision Making:  The following items were considered in medical decision making:  Review / order clinical lab tests  Review / order other diagnostic tests/interventions  Reviewed outside records    Electronically signed by Garret Reddish Orren Pietsch 06/30/2021 1:21 PM    Body mass index is 34.86 kg/m??.  BP Readings from Last 3 Encounters:   06/30/21  136/82   03/31/21 148/78   06/02/20 134/84       SLEEP QUALITY MEASURES:   Obstructive Sleep Apnea Follow Up Visit:   Adherence to therapy documented at least annually?: Yes    Reassessment of excessive daytime sleepiness?: Yes    Weight / body mass index assessment at visit?: Yes    If body mass index greater than 25 was discussion of weight management discussed? : Yes    Blood pressure assessed at visit? : Yes          *This note was transcribed with computerized voice recognition technology.  This note has been reviewed for accuracy however inadvertent transcription errors may be present.    This note was completely edited, written and reviewed by me and consists of information cut and pasted from the my most recent visit, my smart phrases and other  Epic tools. I have personally reviewed all aspects of this note to at least include reviewing this patient's chart and problem list, updating the history, physical exam, lab and procedure results, and assessment and plan as detailed above and below.  As such this visit note reflects my current evaluation and management for this patient.

## 2021-07-02 NOTE — Unmapped (Signed)
Request for Renewal PAP Supply order dated 06/30/21 to be pulled sent via community messenger to DME [facility].

## 2021-07-05 MED ORDER — CARVEDILOL 25 MG PO TABS
25 MG | ORAL_TABLET | ORAL | 3 refills | Status: AC
Start: 2021-07-05 — End: 2022-06-21

## 2021-07-05 NOTE — Telephone Encounter (Signed)
Medication:   Requested Prescriptions     Pending Prescriptions Disp Refills    carvedilol (COREG) 25 MG tablet 180 tablet 3     Sig: TAKE 1 TABLET TWICE A DAY     Last Filled:  07/05/2021    Last appt: 04/27/2021   Next appt: Visit date not found    Last OARRS:   RX Monitoring 03/24/2021   Periodic Controlled Substance Monitoring Possible medication side effects, risk of tolerance/dependence & alternative treatments discussed.;No signs of potential drug abuse or diversion identified.

## 2021-07-05 NOTE — Telephone Encounter (Signed)
Patient needs his refill sent to the following pharmacy he is completely out of the medication.         carvedilol (COREG) 25 MG tablet [[id number]]     Order Details  Dose, Route, Frequency: As Directed   Dispense Quantity: 180 tablet Refills: 3          Sig: TAKE 1 TABLET TWICE A DAY         [id number] - [place], [place] - [address] - P [phone] - F [phone]

## 2021-07-05 NOTE — Telephone Encounter (Signed)
Medication Refill Request    carvedilol (COREG) 25 MG tablet    Last OV 04/27/2021    Please send to   [facility], [place] - [address] - [place] [phone] - Carmon Ginsberg [phone]

## 2021-07-06 NOTE — Telephone Encounter (Signed)
[phone] (home)   Called patient to notify him that his refill was sent yesterday. Patient understood.

## 2021-07-09 NOTE — Unmapped (Signed)
Called patient to reschedule 07/04/22 office visit, left a voicemail.

## 2021-07-27 NOTE — Unmapped (Signed)
Contacted patient, he stated that he did not wish to reschedule at this time and will cal back later in the year to schedule office visit. Notified patient that it wold be best to schedule something now due to providers schedule filling up quickly, patient stated that he would still wish to wait to schedule till a later date.

## 2021-08-10 IMAGING — CR [HOSPITAL] L SPINE
2 series · 2 of 2 positions shown · non-contrast
Comparison: None

Lumbar spine radiographs, 3 views
INDICATION: Radiculopathy

[t lumbar spine ap]
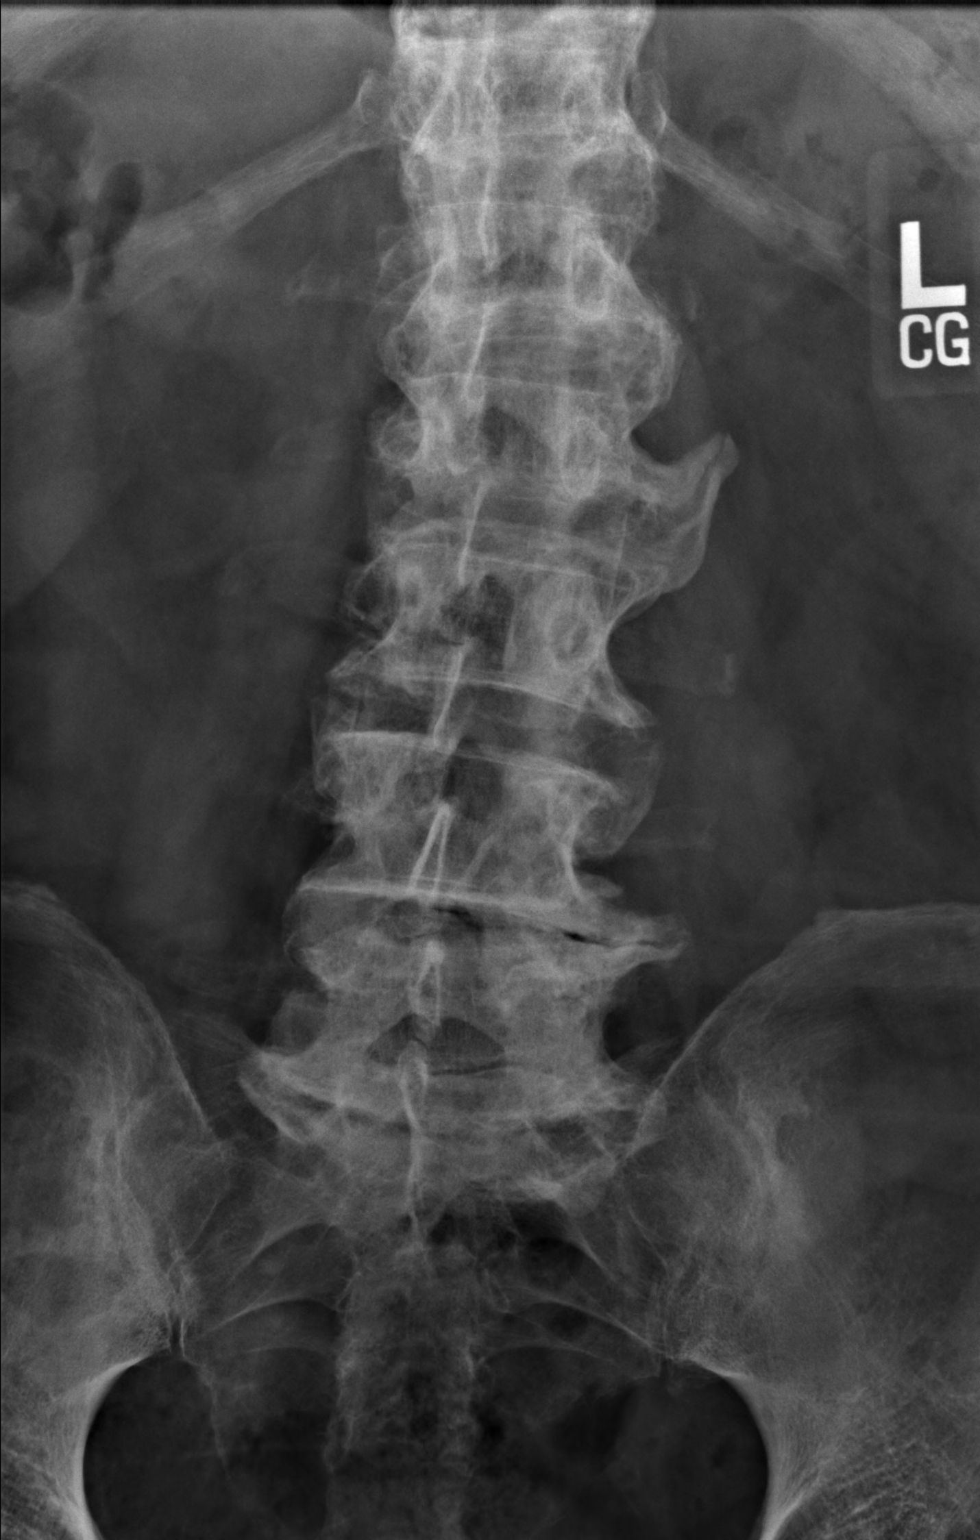

[t lumbar spine lat]
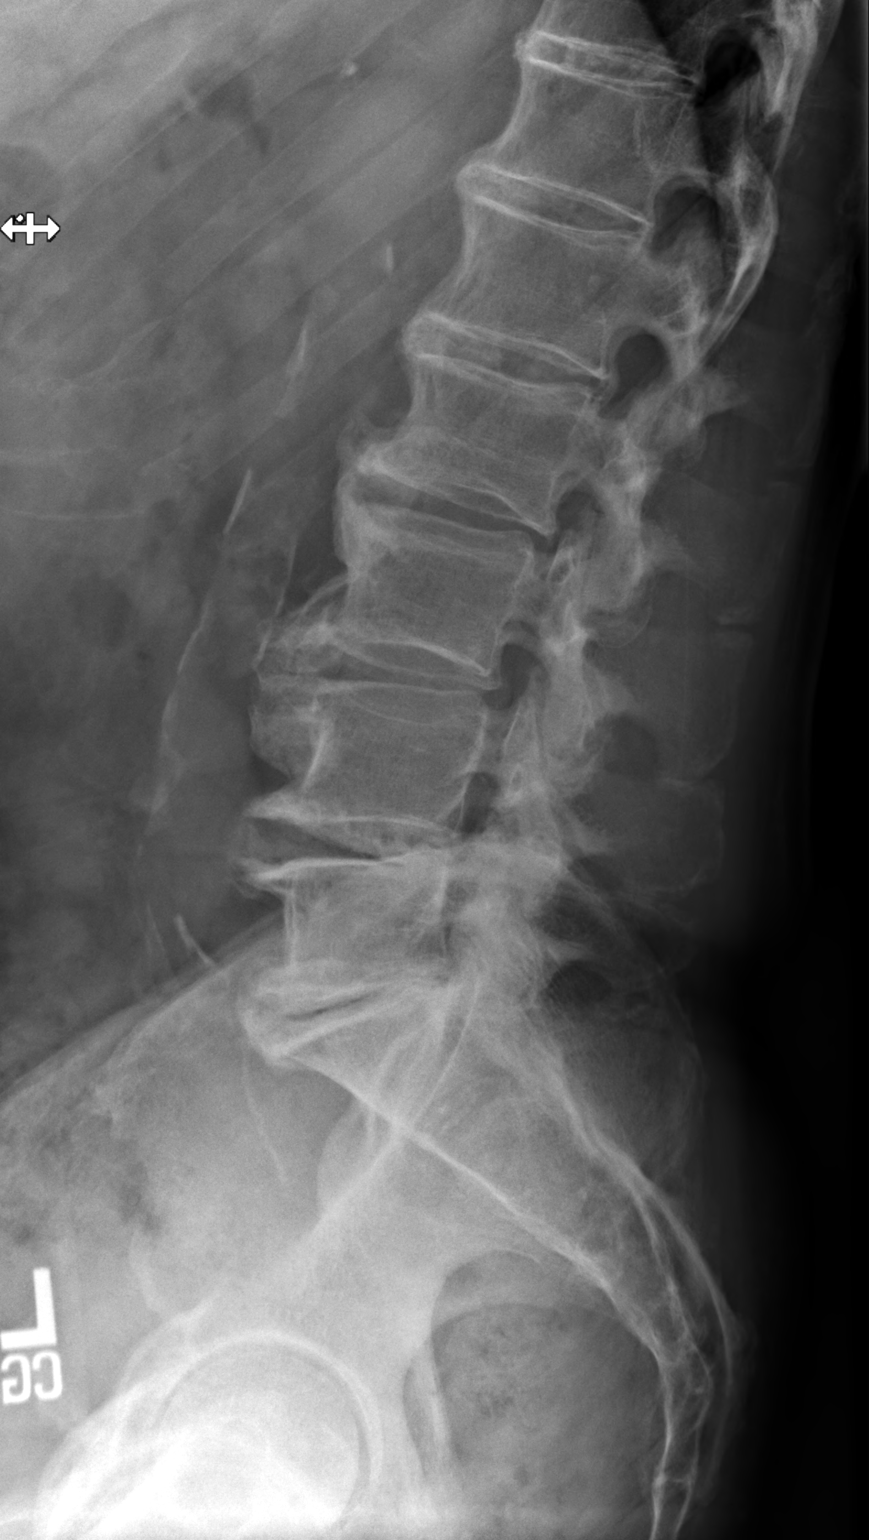

[2 of 2 positions shown; findings below may reference images not displayed]

FINDINGS: 5 lumbar segments. Mild thoracolumbar levoscoliosis. Multilevel degenerative loss of disc space height. Associated marginal endplate osteophytes. Moderate to advanced facet arthropathy. Vascular calcifications of the aorta. DJD involving the SI joints.
IMPRESSION: Lumbar levoscoliosis with associated multilevel degenerative disc disease and facet arthropathy. No fracture or destructive process.

## 2021-08-10 IMAGING — MR MRI LSPINE WO CONTRAST
4 of 6 series · 25 of 48 positions shown · non-contrast
Comparison: Radiograph lumbar spine, 08/10/2021.

HISTORY: 80-year-old male with radiculopathy lumbar spine. Per patient, low back pain especially while sitting.
TECHNIQUE: Multiplanar, multisequential MR images of the lumbar spine were obtained without intravenous contrast.

[Series 1: bSSFP · axial · 8.0mm · 1.37mm/px · z∈[-61,+175]mm · 8 of 25 slices shown]
[im 1/25]
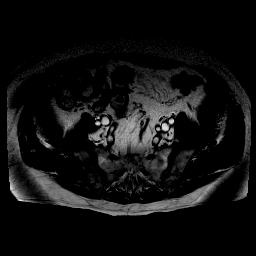
[im 4/25]
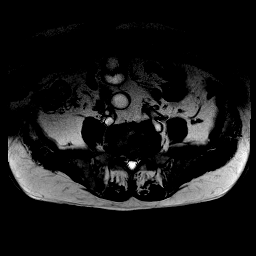
[im 7/25]
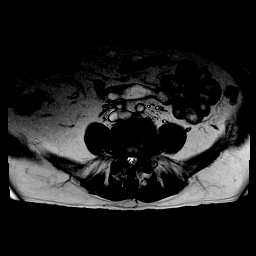
[im 11/25]
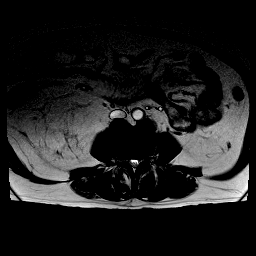
[im 14/25]
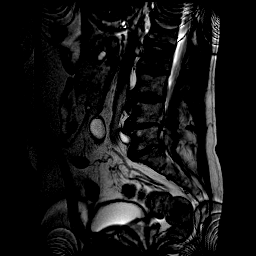
[im 18/25]
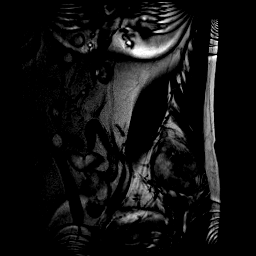
[im 21/25]
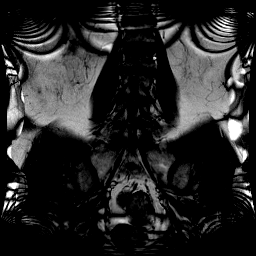
[im 25/25]
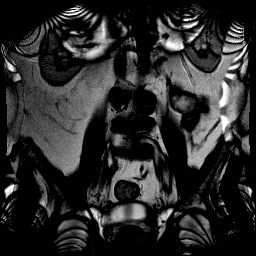

[Series 2: t2_sag · sagittal · 4.0mm · 0.38mm/px · 6 of 15 slices shown]
[im 1/15]
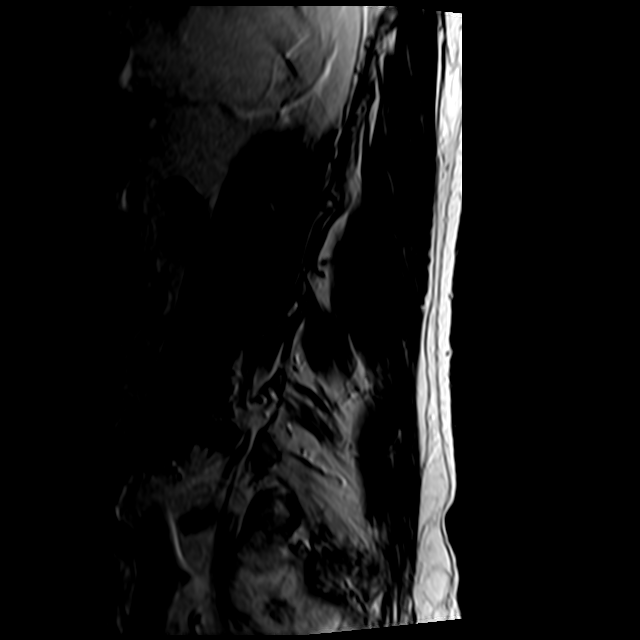
[im 3/15]
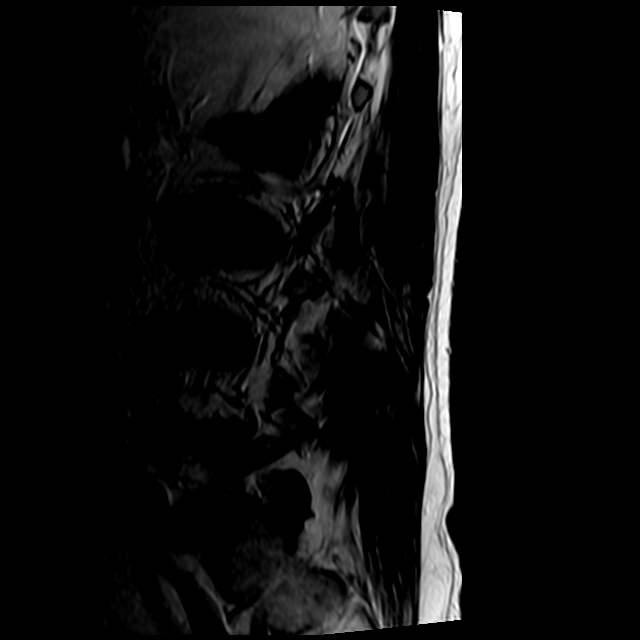
[im 6/15]
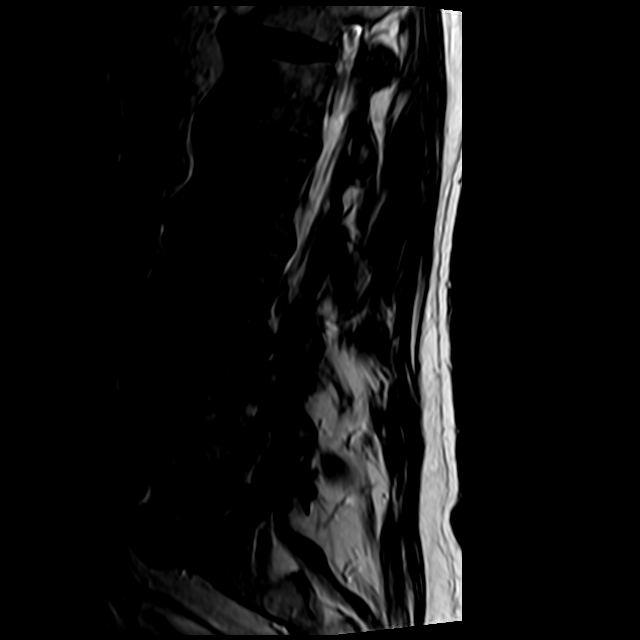
[im 9/15]
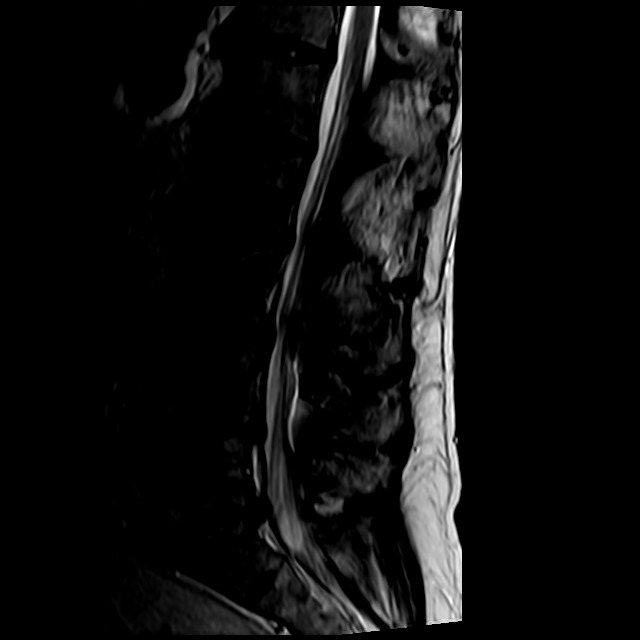
[im 12/15]
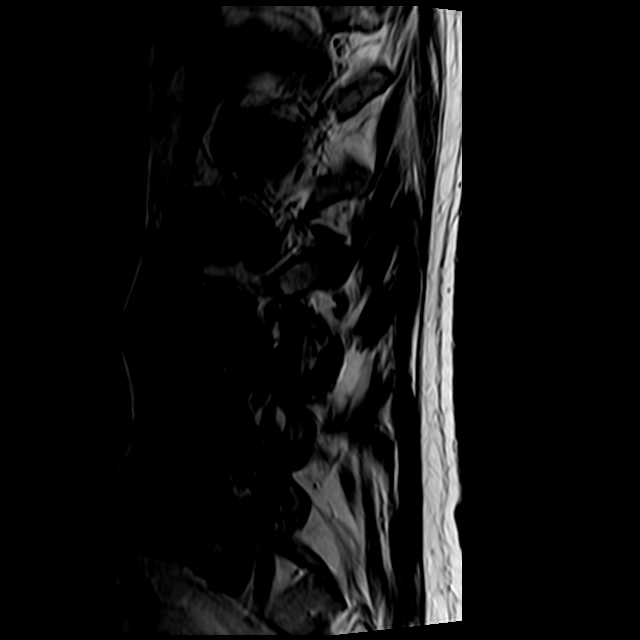
[im 15/15]
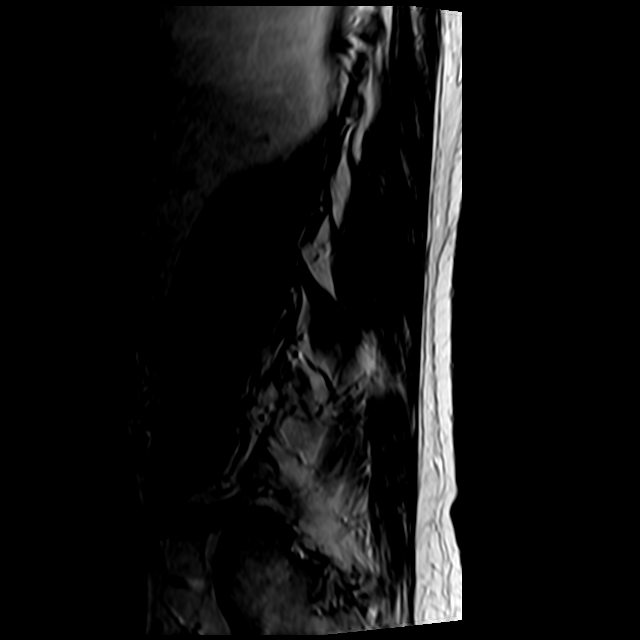

[Series 3: t1_sag · sagittal · 4.0mm · 0.75mm/px · 6 of 15 slices shown]
[im 1/15]
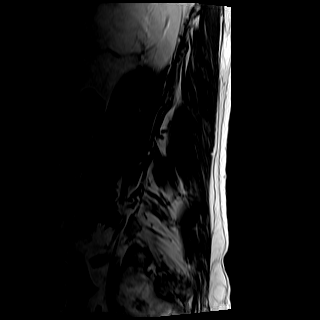
[im 3/15]
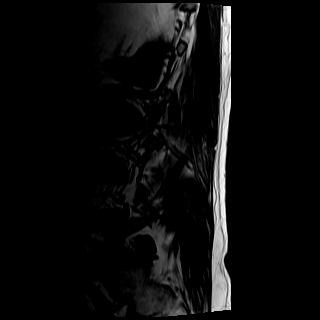
[im 6/15]
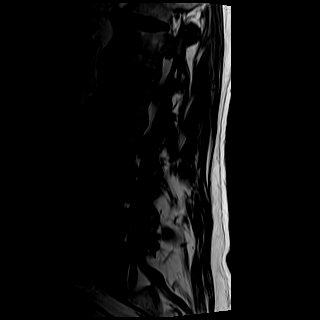
[im 9/15]
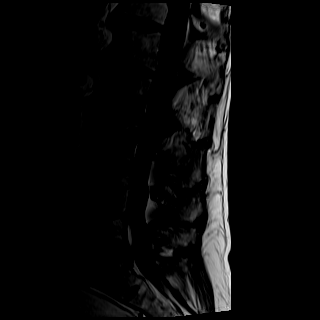
[im 12/15]
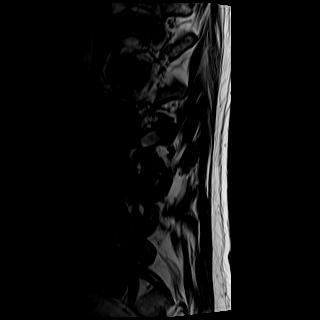
[im 15/15]
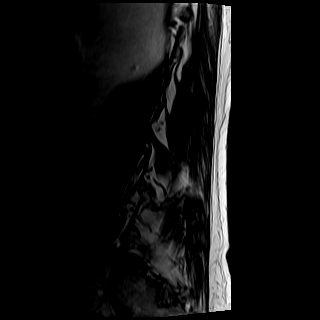

[Series 4: ir_sag · sagittal · 4.0mm · 0.47mm/px · 5 of 15 slices shown]
[im 1/15]
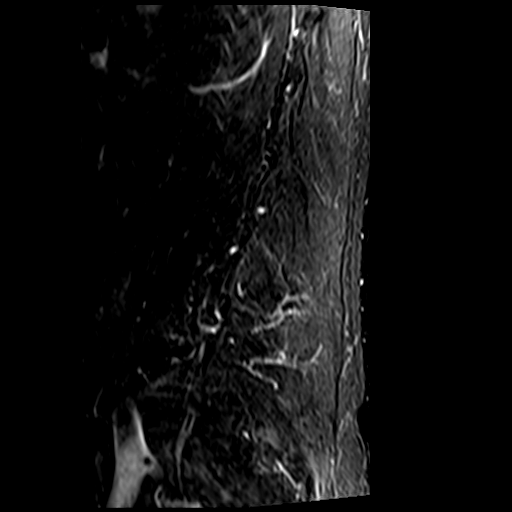
[im 3/15]
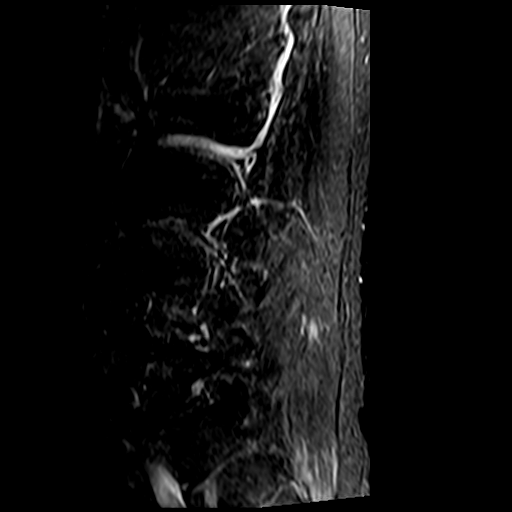
[im 6/15]
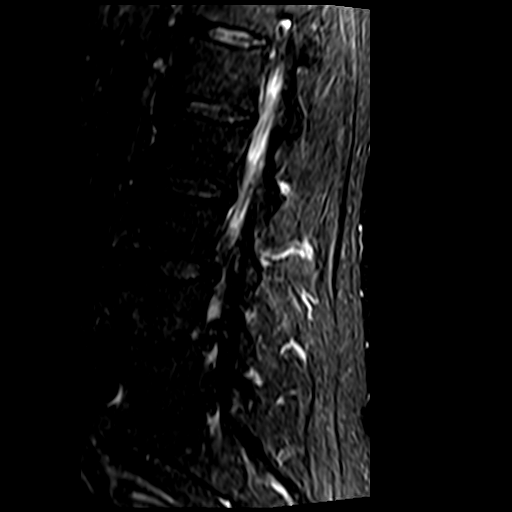
[im 9/15]
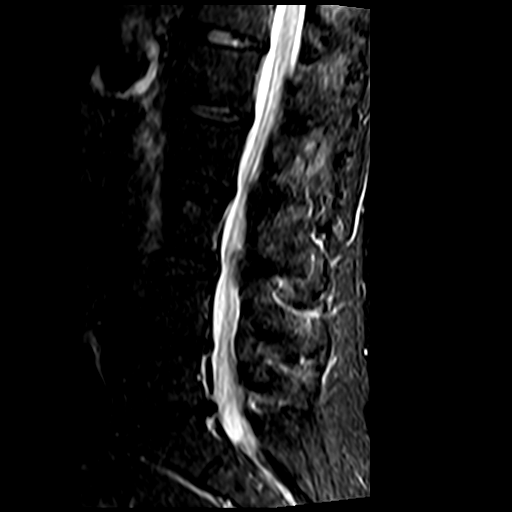
[im 15/15]
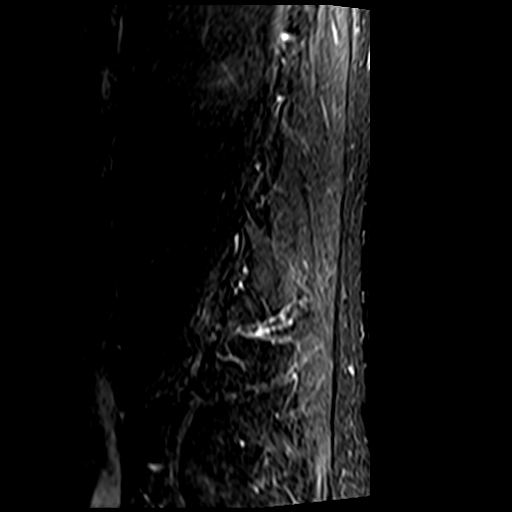

[25 of 48 positions shown; findings below may reference images not displayed]

FINDINGS: Spine labeling: Based on assumption of 5 lumbar type non-rib bearing vertebrae.

Alignment: Shallow leftward curvature centered at L1-L2 and rightward curvature centered at L4.  

Vertebrae and discs: Multilevel degenerative disc disease and discogenic endplate change, most pronounced at L5-S1. 

Marrow: No suspicious osseous lesion or acute fracture.

Conus: Ends at a normal level. Unremarkable cauda equina nerve roots.

T12-L1: No significant canal or neural foraminal stenosis.

L1-L2: Minimal disc bulge. No significant canal or neural foraminal stenosis.

L2-L3: Mild disc bulge. Small posterior annular fissure. Mild facet arthrosis. No significant canal or neural foraminal stenosis.

L3-L4: Mild disc bulge. Small posterior annular fissure. Mild ligamentum flavum thickening and moderate to severe facet arthrosis. Mild canal and mild bilateral neural foraminal stenosis.

L4-L5: Moderate disc bulge and endplate osteophytes. Mild ligamentum flavum thickening and moderate facet arthrosis. Mild to moderate canal, left greater than right lateral recess, moderate left and mild right neural foraminal stenosis. Possible impingement of the exiting left L4 and descending left L5 nerve roots.

L5-S1: Moderate disc bulge and endplate osteophytes. Moderate facet arthrosis. No significant canal stenosis. Moderate left and mild right neural foraminal stenosis. Possible impingement of the exiting left L5 nerve root.

Mild degenerative change of the sacroiliac joints.
IMPRESSION: 1.
Moderate left neural foraminal stenosis at L4-L5 and L5-S1 with possible impingement of the exiting left L4 and L5 nerve roots.

2.
Left greater than right lateral recess stenosis at L4-L5 with possible impingement of the descending left L5 nerve root.

3.
No high-grade canal stenosis.

## 2021-08-10 IMAGING — CR HIP LT 2 VW
2 series · 2 of 2 positions shown · non-contrast
Comparison: None available.

INDICATION: Radiculopathy lumbar region-left hip pain
TECHNIQUE: 2 views of the left hip are obtained.

[t hip ap left]
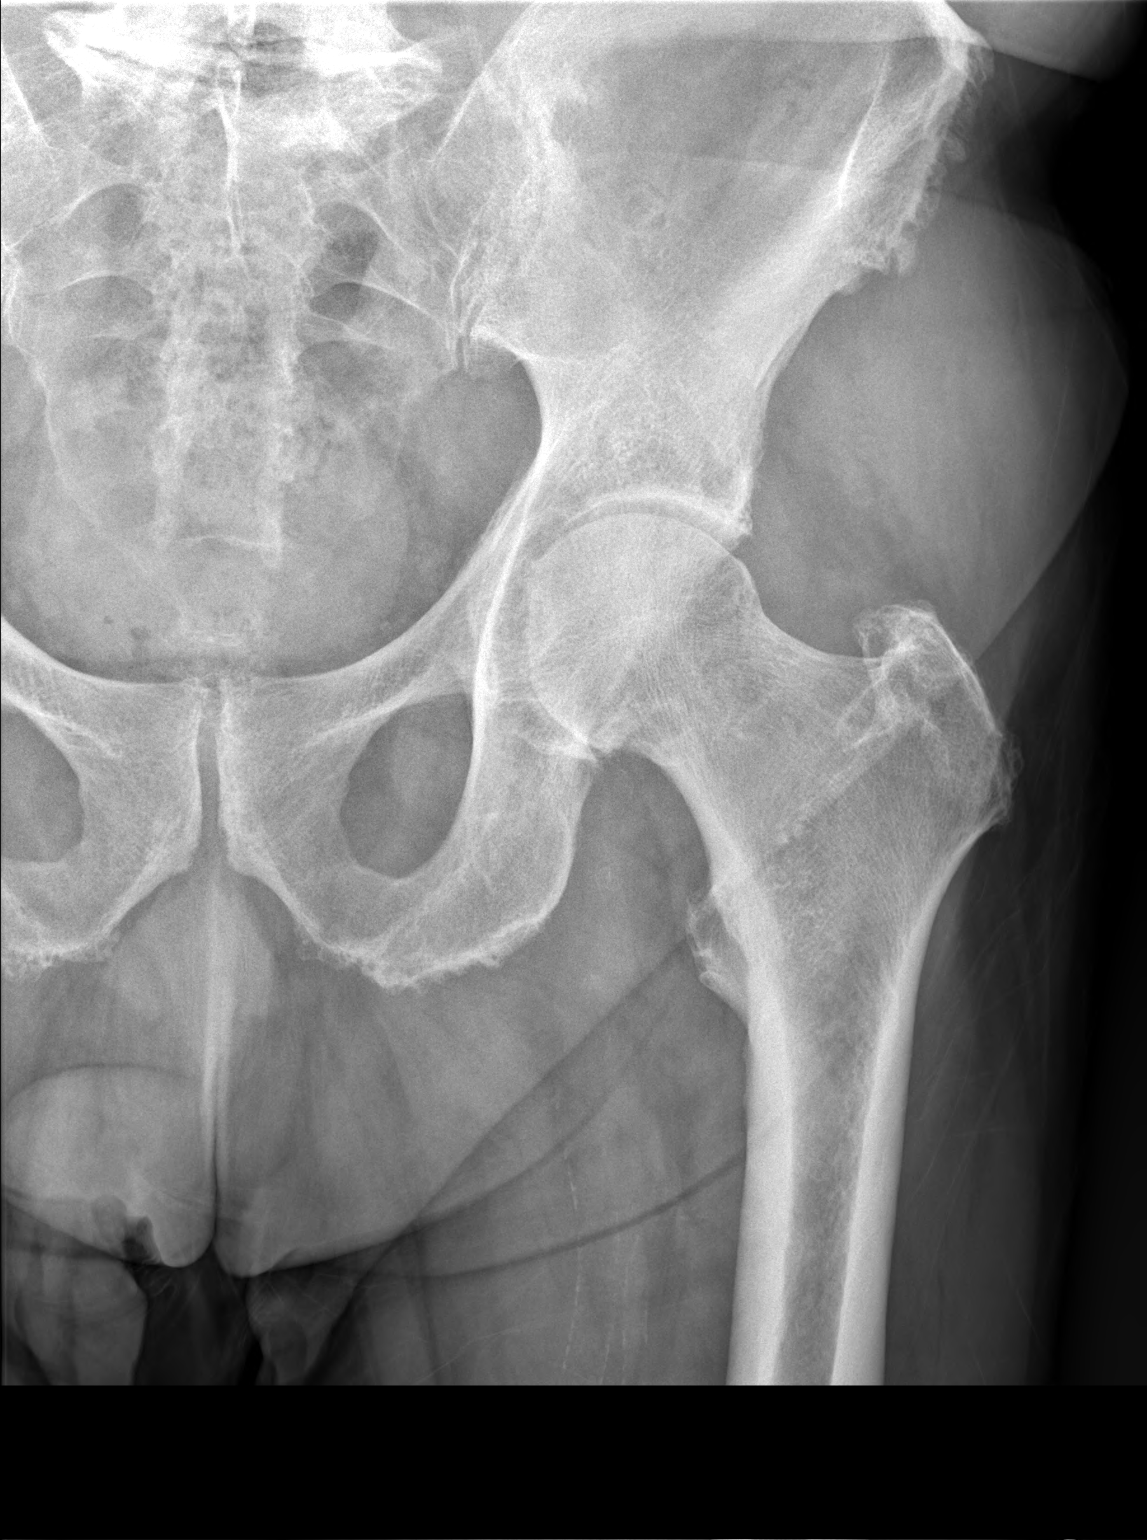

[t hip frog left]
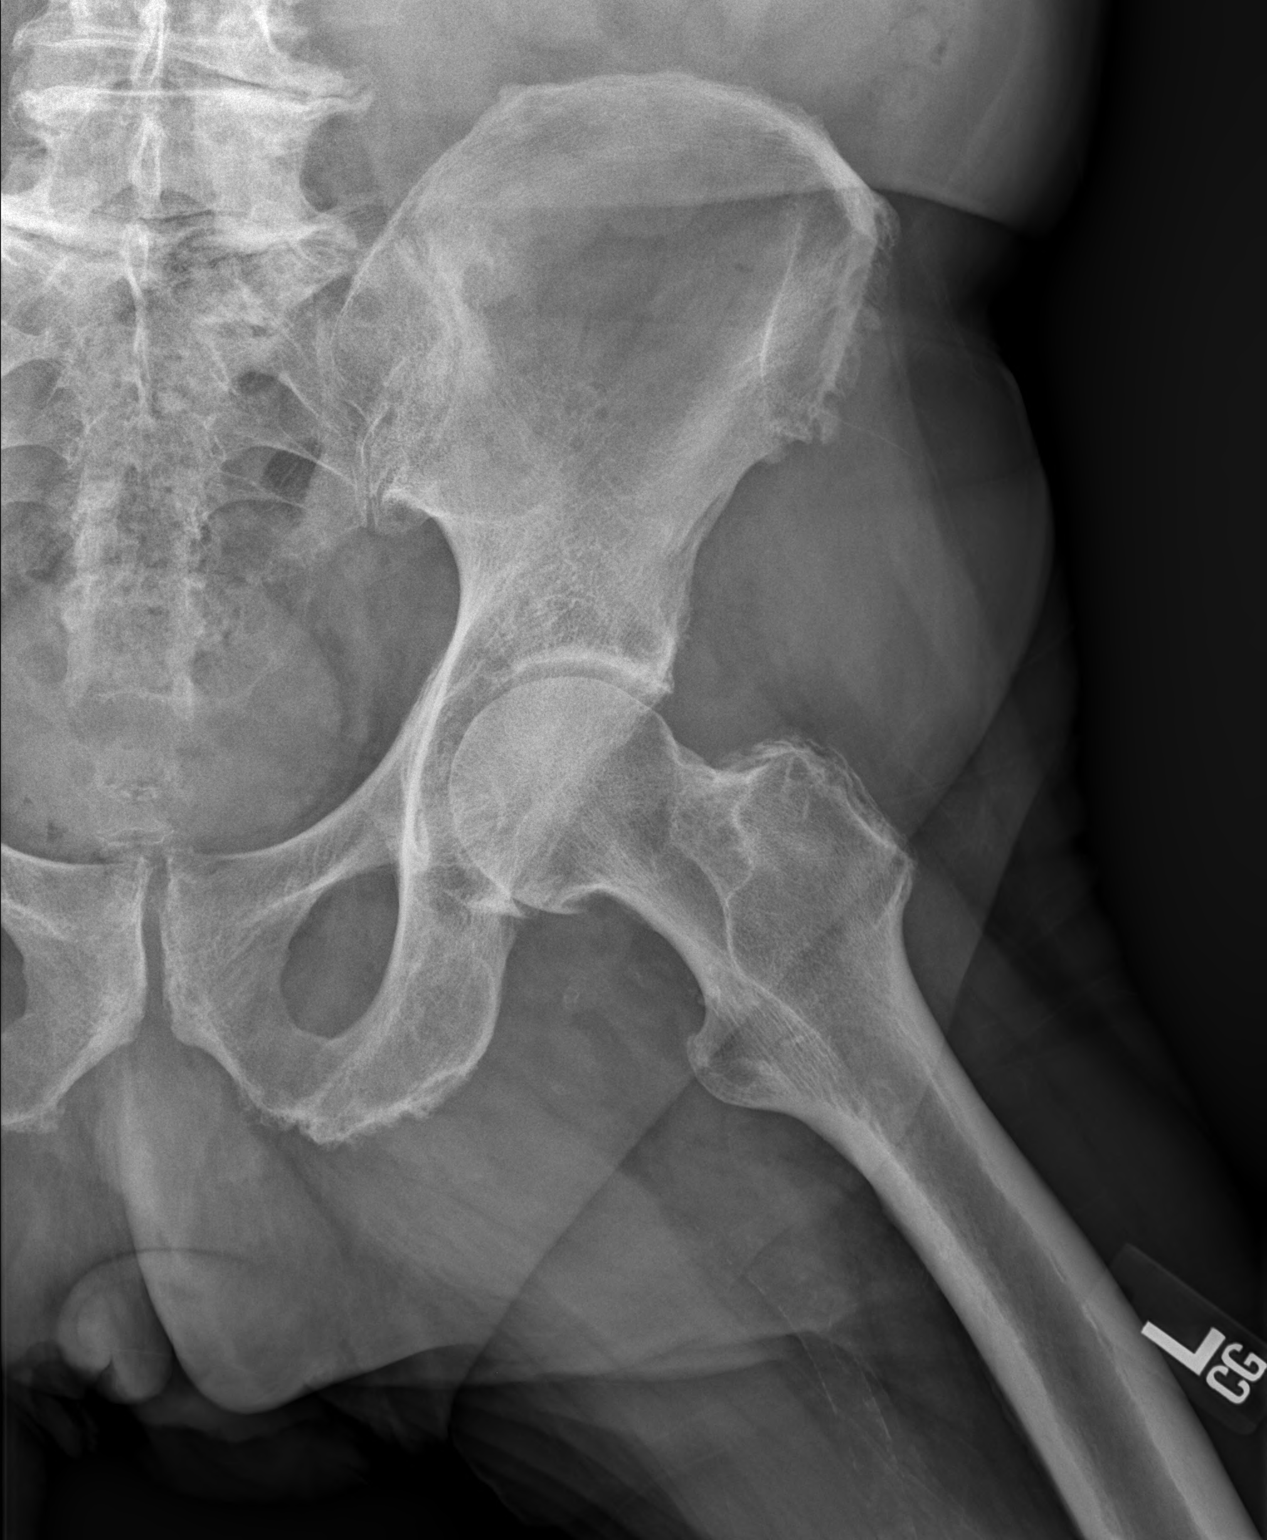

[2 of 2 positions shown; findings below may reference images not displayed]

FINDINGS: No acute fracture or dislocation. Moderate left hip osteoarthritis. Mild to moderate left sacroiliac joint osteoarthritis. Mild pubic symphysis joint osteoarthritis. Partial visualization of degenerative changes of the lower lumbar spine. Scattered vascular calcifications.
IMPRESSION: 1.
No acute fracture of the left hip.

2.
Moderate left hip osteoarthritis.

## 2021-09-20 MED ORDER — METFORMIN HCL 500 MG PO TABS
500 MG | ORAL_TABLET | ORAL | 0 refills | Status: AC
Start: 2021-09-20 — End: 2021-12-21

## 2021-09-20 NOTE — Telephone Encounter (Signed)
Medication:   Requested Prescriptions     Pending Prescriptions Disp Refills    metFORMIN (GLUCOPHAGE) 500 MG tablet [Pharmacy Med Name: METFORMIN TAB 500MG ] 360 tablet 0     Sig: TAKE 2 TABLETS 2 TIMES     DAILY WITH MEALS     Last Filled:  06/21/2021    Last appt: 04/27/2021   Next appt: Visit date not found    Last Labs DM:   Lab Results   Component Value Date/Time    LABA1C 7.5 04/27/2021 04:05 PM

## 2021-10-25 ENCOUNTER — Inpatient Hospital Stay
Admit: 2021-10-25 | Discharge: 2021-10-26 | Disposition: A | Discharge status: Home / Self Care | Payer: Legal Liability / Liability Insurance

## 2021-10-25 ENCOUNTER — Emergency Department: Payer: Legal Liability / Liability Insurance

## 2021-10-25 ENCOUNTER — Emergency Department: Admit: 2021-10-26 | Payer: Legal Liability / Liability Insurance

## 2021-10-25 ENCOUNTER — Ambulatory Visit: Admit: 2021-10-25 | Payer: MEDICARE | Attending: Internal Medicine | Primary: Internal Medicine

## 2021-10-25 DIAGNOSIS — I10 Essential (primary) hypertension: Secondary | ICD-10-CM

## 2021-10-25 DIAGNOSIS — S2222XA Fracture of body of sternum, initial encounter for closed fracture: Secondary | ICD-10-CM

## 2021-10-25 MED ORDER — sodium chloride 0.9 % infusion
INTRAVENOUS | Status: AC
Start: 2021-10-25 — End: 2021-10-25
  Administered 2021-10-26: 03:00:00

## 2021-10-25 MED ORDER — morphine injection 4 mg
4 | Freq: Once | INTRAVENOUS | Status: AC
Start: 2021-10-25 — End: 2021-10-25
  Administered 2021-10-26: 03:00:00 4 mg via INTRAVENOUS

## 2021-10-25 MED ORDER — OMNIPAQUE (iohexol) 350 mg iodine/mL 150 mL
350 | Freq: Once | INTRAVENOUS | Status: AC | PRN
Start: 2021-10-25 — End: 2021-10-25
  Administered 2021-10-26: 03:00:00 150 mL via INTRAVENOUS

## 2021-10-25 MED ORDER — ACCU-CHEK AVIVA PLUS VI STRP
ORAL_STRIP | 5 refills | Status: AC
Start: 2021-10-25 — End: 2022-04-20

## 2021-10-25 MED ORDER — ACCU-CHEK FASTCLIX LANCETS MISC
1 refills | Status: DC
Start: 2021-10-25 — End: 2022-04-20

## 2021-10-25 MED ORDER — FUROSEMIDE 20 MG PO TABS
20 MG | ORAL_TABLET | Freq: Every day | ORAL | 5 refills | Status: AC
Start: 2021-10-25 — End: 2022-07-14
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
## 2021-10-25 NOTE — Assessment & Plan Note (Signed)
Patient counseled,   Encouraged to lose weight, watch diet and exercise consistently.
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
## 2021-10-25 NOTE — Progress Notes (Signed)
Subjective:      Patient ID: Carl Blair is a 81 y.o. male.    HPI  Established patient here for a visit to manage acute and chronic medical conditions as detailed below.    Chronic a-fib (HCC)  On eliquis.  Patient is compliant w medications, no side effects, effective, provides adequate symptom relief. No new symptoms or problems as noted by patient.  The problem is stable, no changes noted by patient. Will consider monitoring labs and refill medications as appropriate. Patient counseled and will continue current plan.      Essential hypertension  This is a chronic problem. The problem is well controlled.  Patient monitors readings regularly. Pertinent negatives include no chest pain, focal sensory loss, focal weakness, leg pain, myalgias or shortness of breath. No headaches or chest pain. Takes medications regularly.  Blood pressure has been stable, blood work was reviewed, and advised patient to continue the current instructions or medications.       Chronic systolic congestive heart failure (HCC)  On coreg and asa and eliquis,  Patient is compliant w medications, no side effects, effective, provides adequate symptom relief. No new symptoms or problems as noted by patient.  The problem is stable, no changes noted by patient. Will consider monitoring labs and refill medications as appropriate. Patient counseled and will continue current plan.      Acquired hypothyroidism  On synthroid,  Patient is compliant w medications, no side effects, effective, provides adequate symptom relief. No new symptoms or problems as noted by patient.  The problem is stable, no changes noted by patient. Will consider monitoring labs and refill medications as appropriate. Patient counseled and will continue current plan.      Type 2 diabetes mellitus without complication, without long-term current use of insulin (HCC)  Diabetes Mellitus Type II:  Home blood sugar records reviewed: fasting range: 120-130.  No significant episodes  of hypoglycemia. Compliant with medications.  No polyuria, polydipsia, visual changes, foot problems, GI upset.  Diabetic diet compliance:  compliant most of the time.  Current exercise: none. Will check labs, and refill medications as appropriate.    Hypertension:  Denies CP, SOB, visual changes, dizziness, palpitations or HA.  He is adherent to a low sodium diet.  Blood pressure typically runs ok  outside of the office. Continue current medications.    Hyperlipidemia:  No new myalgias or GI upset on current medications.       Lab Results   Component Value Date    LABA1C 7.5 04/27/2021    LABA1C 8.7 (H) 03/28/2021    LABA1C 8.8 01/01/2021     Lab Results   Component Value Date    LABMICR 4.60 (H) 03/27/2020    CREATININE 0.9 04/27/2021     Lab Results   Component Value Date    ALT 9 (L) 04/27/2021    AST 11 (L) 04/27/2021     No components found for: CHLPL  Lab Results   Component Value Date    TRIG 100 04/27/2021     Lab Results   Component Value Date    HDL 33 (L) 04/27/2021     Lab Results   Component Value Date/Time    LDLCALC 91 04/27/2021 04:05 PM       This problem was reviewed in detail. It is under control and stable. Will check blood work.      Pure hypercholesterolemia  This has been a long standing problem, takes lipitor  Monitors diet and tries to follow a low fat diet. Has  been reasonably  compliant w exercise. Lipids have been stable, The problem is controlled. Recent lipid tests were reviewed and are normal. Pertinent negatives include no chest pain, focal sensory loss, focal weakness, leg pain, myalgias or shortness of breath.  Advised patient to continue the current instructions or medications.       Morbid obesity due to excess calories [facility])  Patient counseled,   Encouraged to lose weight, watch diet and exercise consistently.      Review of Systems  ROS: No unusual headaches or allergy symptoms or blurred vision. No prolonged cough. No flushing or facial pain or chest pain,dizziness,  dyspnea, palpitations, or chest pain on exertion. No syncope. No nausea or vommitting or diarrhea.  No jaundice or abdominal pain, change in bowel habits, black or bloody stools.  No dysuria or hematuria or frequency of urination.   No myalgias or muscle pain.  No numbness, weakness, or tingling. No falls, or loss of consciousness. No weight loss or back pain. No falls.  No paresthesias. No joint swelling or redness. No joint pain. No recent weight loss. No focal weakness or sensory deficits or paresthesias, No confusion or altered sensorium. No hematemesis. No hearing loss. No siezures. All other systems were reviewed, and review was negative.   Objective:   Physical Exam  BP 136/82   Pulse 83   Temp 96.9 F (36.1 C)   Resp 16   Ht 6' (1.829 m)   Wt 255 lb (115.7 kg)   SpO2 98%   BMI 34.58 kg/m    The physical exam reveals a patient who appears well, alert and oriented x 3, pleasant, cooperative. Vitals are as noted. Head is atraumatic and normocephalic. Eyes reveal normal conjunctiva, cornea normal, pupils are equal and rective to light. Nasal mucosa is normal. Throat is normal without exudates. Ears reveal normal tympanic membranes.Neck is supple and free of adenopathy, or masses. No thyromegaly. No jugular venous distension. Lungs are clear to auscultation, no rales or rhonchi noted. Heart sounds are regular , no murmurs, clicks, gallops or rubs. Abdomen is soft, no tenderness, masses or organomegaly. Bowel sounds are normally heard.Pelvis: normal. Extremities are normal. Peripheral pulses are normal. Screening neurological exam is normal without focal findings. Cranial nerves are intact, reflexes are symmetrical and muscle strength eaqual. Skin is normal without suspicious lesions noted.   Assessment:      Chronic a-fib (HCC)  On eliquis.  Patient is compliant w medications, no side effects, effective, provides adequate symptom relief. No new symptoms or problems as noted by patient.  The problem is  stable, no changes noted by patient. Will consider monitoring labs and refill medications as appropriate. Patient counseled and will continue current plan.      Essential hypertension  This is a chronic problem. The problem is well controlled.  Patient monitors readings regularly. Pertinent negatives include no chest pain, focal sensory loss, focal weakness, leg pain, myalgias or shortness of breath. No headaches or chest pain. Takes medications regularly.  Blood pressure has been stable, blood work was reviewed, and advised patient to continue the current instructions or medications.       Chronic systolic congestive heart failure (HCC)  On coreg and asa and eliquis,  Patient is compliant w medications, no side effects, effective, provides adequate symptom relief. No new symptoms or problems as noted by patient.  The problem is stable, no changes noted by patient. Will consider  monitoring labs and refill medications as appropriate. Patient counseled and will continue current plan.      Acquired hypothyroidism  On synthroid,  Patient is compliant w medications, no side effects, effective, provides adequate symptom relief. No new symptoms or problems as noted by patient.  The problem is stable, no changes noted by patient. Will consider monitoring labs and refill medications as appropriate. Patient counseled and will continue current plan.      Type 2 diabetes mellitus without complication, without long-term current use of insulin (HCC)  Diabetes Mellitus Type II:  Home blood sugar records reviewed: fasting range: 120-130.  No significant episodes of hypoglycemia. Compliant with medications.  No polyuria, polydipsia, visual changes, foot problems, GI upset.  Diabetic diet compliance:  compliant most of the time.  Current exercise: none. Will check labs, and refill medications as appropriate.    Hypertension:  Denies CP, SOB, visual changes, dizziness, palpitations or HA.  He is adherent to a low sodium diet.  Blood  pressure typically runs ok  outside of the office. Continue current medications.    Hyperlipidemia:  No new myalgias or GI upset on current medications.       Lab Results   Component Value Date    LABA1C 7.5 04/27/2021    LABA1C 8.7 (H) 03/28/2021    LABA1C 8.8 01/01/2021     Lab Results   Component Value Date    LABMICR 4.60 (H) 03/27/2020    CREATININE 0.9 04/27/2021     Lab Results   Component Value Date    ALT 9 (L) 04/27/2021    AST 11 (L) 04/27/2021     No components found for: CHLPL  Lab Results   Component Value Date    TRIG 100 04/27/2021     Lab Results   Component Value Date    HDL 33 (L) 04/27/2021     Lab Results   Component Value Date/Time    LDLCALC 91 04/27/2021 04:05 PM       This problem was reviewed in detail. It is under control and stable. Will check blood work.      Pure hypercholesterolemia  This has been a long standing problem, takes lipitor      Monitors diet and tries to follow a low fat diet. Has  been reasonably  compliant w exercise. Lipids have been stable, The problem is controlled. Recent lipid tests were reviewed and are normal. Pertinent negatives include no chest pain, focal sensory loss, focal weakness, leg pain, myalgias or shortness of breath.  Advised patient to continue the current instructions or medications.       Morbid obesity due to excess calories [facility])  Patient counseled,   Encouraged to lose weight, watch diet and exercise consistently.          Plan:      Labs ordered, reviewed.   Medications refilled.   All Health maintenance needs reviewed and the needful ordered.           Marcello Fennel, MD
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
## 2021-10-25 NOTE — Assessment & Plan Note (Signed)
On synthroid,  Patient is compliant w medications, no side effects, effective, provides adequate symptom relief. No new symptoms or problems as noted by patient.  The problem is stable, no changes noted by patient. Will consider monitoring labs and refill medications as appropriate. Patient counseled and will continue current plan.
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
## 2021-10-25 NOTE — Assessment & Plan Note (Signed)
Diabetes Mellitus Type II:  Home blood sugar records reviewed: fasting range: 120-130.  No significant episodes of hypoglycemia. Compliant with medications.  No polyuria, polydipsia, visual changes, foot problems, GI upset.  Diabetic diet compliance:  compliant most of the time.  Current exercise: none. Will check labs, and refill medications as appropriate.    Hypertension:  Denies CP, SOB, visual changes, dizziness, palpitations or HA.  He is adherent to a low sodium diet.  Blood pressure typically runs ok  outside of the office. Continue current medications.    Hyperlipidemia:  No new myalgias or GI upset on current medications.       Lab Results   Component Value Date    LABA1C 7.5 04/27/2021    LABA1C 8.7 (H) 03/28/2021    LABA1C 8.8 01/01/2021     Lab Results   Component Value Date    LABMICR 4.60 (H) 03/27/2020    CREATININE 0.9 04/27/2021     Lab Results   Component Value Date    ALT 9 (L) 04/27/2021    AST 11 (L) 04/27/2021     No components found for: CHLPL  Lab Results   Component Value Date    TRIG 100 04/27/2021     Lab Results   Component Value Date    HDL 33 (L) 04/27/2021     Lab Results   Component Value Date/Time    LDLCALC 91 04/27/2021 04:05 PM       This problem was reviewed in detail. It is under control and stable. Will check blood work.

## 2021-10-25 NOTE — Assessment & Plan Note (Signed)
On eliquis.  Patient is compliant w medications, no side effects, effective, provides adequate symptom relief. No new symptoms or problems as noted by patient.  The problem is stable, no changes noted by patient. Will consider monitoring labs and refill medications as appropriate. Patient counseled and will continue current plan.

## 2021-10-25 NOTE — Assessment & Plan Note (Signed)
This is a chronic problem. The problem is well controlled.  Patient monitors readings regularly. Pertinent negatives include no chest pain, focal sensory loss, focal weakness, leg pain, myalgias or shortness of breath. No headaches or chest pain. Takes medications regularly.  Blood pressure has been stable, blood work was reviewed, and advised patient to continue the current instructions or medications.

## 2021-10-25 NOTE — Assessment & Plan Note (Signed)
On coreg and asa and eliquis,  Patient is compliant w medications, no side effects, effective, provides adequate symptom relief. No new symptoms or problems as noted by patient.  The problem is stable, no changes noted by patient. Will consider monitoring labs and refill medications as appropriate. Patient counseled and will continue current plan.

## 2021-10-25 NOTE — Assessment & Plan Note (Signed)
This has been a long standing problem, takes lipitor      Monitors diet and tries to follow a low fat diet. Has  been reasonably  compliant w exercise. Lipids have been stable, The problem is controlled. Recent lipid tests were reviewed and are normal. Pertinent negatives include no chest pain, focal sensory loss, focal weakness, leg pain, myalgias or shortness of breath.  Advised patient to continue the current instructions or medications.

## 2021-10-26 LAB — BASIC METABOLIC PANEL
Anion Gap: 8 mmol/L (ref 3–16)
BUN: 17 mg/dL (ref 7–25)
CO2: 28 mmol/L (ref 21–33)
Calcium: 9 mg/dL (ref 8.6–10.3)
Chloride: 99 mmol/L (ref 98–110)
Creatinine: 1.08 mg/dL (ref 0.60–1.30)
EGFR: 69
Glucose: 238 mg/dL (ref 70–100)
Osmolality, Calculated: 289 mOsm/kg (ref 278–305)
Potassium: 4.2 mmol/L (ref 3.5–5.3)
Sodium: 135 mmol/L (ref 133–146)

## 2021-10-26 LAB — CBC
Hematocrit: 36.9 % (ref 38.5–50.0)
Hemoglobin: 11.9 g/dL (ref 13.2–17.1)
MCH: 27.1 pg (ref 27.0–33.0)
MCHC: 32.3 g/dL (ref 32.0–36.0)
MCV: 83.8 fL (ref 80.0–100.0)
MPV: 6.9 fL (ref 7.5–11.5)
Platelets: 156 10*3/uL (ref 140–400)
RBC: 4.4 10*6/uL (ref 4.20–5.80)
RDW: 16.8 % (ref 11.0–15.0)
WBC: 11.2 10*3/uL (ref 3.8–10.8)

## 2021-10-26 LAB — DIFFERENTIAL
Basophils Absolute: 45 /uL (ref 0–200)
Basophils Relative: 0.4 % (ref 0.0–1.0)
Eosinophils Absolute: 336 /uL (ref 15–500)
Eosinophils Relative: 3 % (ref 0.0–8.0)
Lymphocytes Absolute: 3998 /uL (ref 850–3900)
Lymphocytes Relative: 35.7 % (ref 15.0–45.0)
Monocytes Absolute: 941 /uL (ref 200–950)
Monocytes Relative: 8.4 % (ref 0.0–12.0)
Neutrophils Absolute: 5880 /uL (ref 1500–7800)
Neutrophils Relative: 52.5 % (ref 40.0–80.0)
nRBC: 0 /100 WBC (ref 0–0)

## 2021-10-26 LAB — B NATRIURETIC PEPTIDE: BNP: 92 pg/mL (ref 0–100)

## 2021-10-26 LAB — HIGH SENSITIVITY TROPONIN
High Sensitivity Troponin: 7 ng/L (ref 0–20)
High Sensitivity Troponin: 8 ng/L (ref 0–20)

## 2021-10-26 MED FILL — MORPHINE 4 MG/ML INTRAVENOUS SOLUTION: 4 4 mg/mL | INTRAVENOUS | Qty: 1

## 2021-10-26 MED FILL — SODIUM CHLORIDE 0.9 % INTRAVENOUS SOLUTION: INTRAVENOUS | Qty: 50

## 2021-10-26 NOTE — Unmapped (Signed)
RT at bedside.

## 2021-10-26 NOTE — Unmapped (Signed)
Placed call to RT for spirometer.

## 2021-10-26 NOTE — Unmapped (Signed)
Pt discharged to home. Pt verbalized understanding of discharge instructions. All questions answered. IV removed. Catheter intact. Direct pressure applied until hemostasis.  Ambulated from room with steady gait.

## 2021-10-26 NOTE — Unmapped (Addendum)
Take deep breaths every 10 min while awake to avoid pneumonia    Take Tylenol as directed

## 2021-10-26 NOTE — ED Notes (Signed)
Formatting of this note might be different from the original.  Pt discharged to home. Pt verbalized understanding of discharge instructions. All questions answered. IV removed. Catheter intact. Direct pressure applied until hemostasis.  Ambulated from room with steady gait.     Electronically signed by Maurice March, LPN at 57/84/6962  2:36 AM EDT

## 2021-10-26 NOTE — ED Notes (Signed)
Formatting of this note might be different from the original.  Placed call to RT for spirometer.  Electronically signed by Maurice March, LPN at 09/62/8366  1:28 AM EDT

## 2021-10-26 NOTE — ED Notes (Signed)
Formatting of this note might be different from the original.  RT at bedside.  Electronically signed by Maurice March, LPN at 48/27/0786  1:40 AM EDT

## 2021-11-08 NOTE — Unmapped (Signed)
Pt called stating [facility] is waiting on a form from [place]     Pt can be reached at [phone]

## 2021-11-08 NOTE — Telephone Encounter (Signed)
Formatting of this note might be different from the original.  Pt called stating [facility] is waiting on a form from [place]     Pt can be reached at [phone]   Electronically signed by Darletta Moll, MA at 11/08/2021  2:44 PM EDT

## 2021-11-09 NOTE — Unmapped (Signed)
Contacted [facility] inquiring what paperwork is needed. Spoke to Olivia who reports was needing Medicare compliant order.  Updated [place] Comment that medicare compliant order from [place] dated 06/30/2021 should be available to them.  Marin Comment confirmed they have order and he will send it to team to process.   No additional paperwork should be needed.     Contacted patient and updated to above.

## 2021-11-09 NOTE — Telephone Encounter (Signed)
Formatting of this note might be different from the original.  Contacted [facility] inquiring what paperwork is needed. Spoke to [place] who reports was needing Medicare compliant order.  Updated [place] Comment that medicare compliant order from [place] dated 06/30/2021 should be available to them.  Marin Comment confirmed they have order and he will send it to team to process.   No additional paperwork should be needed.     Contacted patient and updated to above.       Electronically signed by Sanjuan Dame, RN at 11/09/2021  9:38 AM EDT

## 2021-12-21 MED ORDER — METFORMIN HCL 500 MG PO TABS
500 MG | ORAL_TABLET | ORAL | 0 refills | Status: DC
Start: 2021-12-21 — End: 2022-04-20

## 2021-12-21 MED ORDER — APIXABAN 5 MG PO TABS
5 MG | ORAL_TABLET | Freq: Two times a day (BID) | ORAL | 0 refills | Status: DC
Start: 2021-12-21 — End: 2022-03-21

## 2021-12-21 NOTE — Telephone Encounter (Signed)
Medication:   Requested Prescriptions     Pending Prescriptions Disp Refills    metFORMIN (GLUCOPHAGE) 500 MG tablet [Pharmacy Med Name: METFORMIN TAB 500MG ] 360 tablet 0     Sig: TAKE 2 TABLETS 2 TIMES     DAILY WITH MEALS     Last Filled:  09/20/2021    Last appt: 10/25/2021   Next appt: Visit date not found    Last Labs DM:   Lab Results   Component Value Date/Time    LABA1C 7.5 04/27/2021 04:05 PM

## 2021-12-21 NOTE — Telephone Encounter (Signed)
Pt needs meds refill. Lov 10/25/21      apixaban (ELIQUIS) 5 MG TABS tablet  [facility] - [place], [place] - [address] - P [phone] - F [phone]

## 2022-01-28 NOTE — Telephone Encounter (Signed)
POPULATION [facility] CLINICAL PHARMACY: ADHERENCE REVIEW  Identified care gap per [employer]: fills at [facility] : Statin adherence    Patient also appears to be prescribed: Statin    ASSESSMENT  [place] Records claims through 01/24/22 (Prior Year [place] = not reported; YTD [place] = 80%; Potential Fail Date: 02/03/22):   ATORVASTATIN 20 MG TABLET last filled on 09/18/21 for 90 day supply. Next refill due: 12/17/21    Prescribed sig: 1 tablet/capsule daily    Per Reconcile Dispense History: last filled on 09/18/21 for 90 day supply.     No outreach to the pharmacy     Lab Results   Component Value Date    CHOL 144 04/27/2021    TRIG 100 04/27/2021    HDL 33 (L) 04/27/2021    LDLCALC 91 04/27/2021     ALT   Date Value Ref Range Status   04/27/2021 9 (L) 10 - 40 U/L Final     AST   Date Value Ref Range Status   04/27/2021 11 (L) 15 - 37 U/L Final     The ASCVD Risk score (Arnett DK, et al., 2019) failed to calculate for the following reasons:    The 2019 ASCVD risk score is only valid for ages 65 to 26     PLAN  The following are interventions that have been identified:   Patient overdue refilling ATORVASTATIN 20 MG TABLET and active on home medication list.     Attempting to reach patient to review - unable to leave message. MyChart message sent to patient.  Number on file is d/c    Recent Visits  Date Type Provider Dept   10/25/21 Office Visit Olga Millers, MD [facility]   04/27/21 Office Visit Olga Millers, MD [facility]   02/22/21 Office Visit Olga Millers, MD [facility]   01/01/21 Office Visit Olga Millers, MD [facility]   Showing recent visits within past 540 days with a meds authorizing provider and meeting all other requirements  Future Appointments  No visits were found meeting these conditions.  Showing future appointments within next 150 days with a meds authorizing provider and meeting all other requirements    No future appointments.    [place] // Department, toll free [phone], Option 3    For Pharmacy Admin Tracking Only    Program: Fries in place:  No  Gap Closed?: No   Time Spent (min): 5

## 2022-03-21 MED ORDER — ELIQUIS 5 MG PO TABS
5 MG | ORAL_TABLET | Freq: Two times a day (BID) | ORAL | 0 refills | Status: DC
Start: 2022-03-21 — End: 2022-06-21

## 2022-03-21 MED ORDER — GLIMEPIRIDE 2 MG PO TABS
2 MG | ORAL_TABLET | ORAL | 3 refills | Status: DC
Start: 2022-03-21 — End: 2022-04-20

## 2022-03-21 NOTE — Telephone Encounter (Signed)
Medication:   Requested Prescriptions     Pending Prescriptions Disp Refills    ELIQUIS 5 MG TABS tablet [Pharmacy Med Name: ELIQUIS TAB 5MG ] 180 tablet 0     Sig: TAKE 1 TABLET TWICE A DAY     Last Filled:  12/21/2021    Last appt: 10/25/2021   Next appt: 03/19/2022    Last [place]AR[place]:       03/24/2021    12:59 PM   RX Monitoring   Periodic Controlled Substance Monitoring Possible medication side effects, risk of tolerance/dependence & alternative treatments discussed.;No signs of potential drug abuse or diversion identified.

## 2022-03-21 NOTE — Telephone Encounter (Signed)
Medication:   Requested Prescriptions     Pending Prescriptions Disp Refills    glimepiride (AMARYL) 2 MG tablet [Pharmacy Med Name: GLIMEPIRIDE  TAB 2MG ] 180 tablet 3     Sig: TAKE 1 TABLET TWICE DAILY  WITH MEALS     Last Filled:  04/06/2021    Last appt: 10/25/2021   Next appt: Visit date not found    Last Labs DM:   Lab Results   Component Value Date/Time    LABA1C 7.5 04/27/2021 04:05 PM

## 2022-03-21 NOTE — Telephone Encounter (Signed)
Formatting of this note might be different from the original.  Last Cardiology office visit: 02/2021    Most recent labs: Labs in [place] - Renal 10/2021  Electronically signed by Fran Lowes, LPN at 29/56/2130  8:65 AM EST

## 2022-04-20 ENCOUNTER — Ambulatory Visit: Admit: 2022-04-20 | Discharge: 2022-04-20 | Payer: MEDICARE | Attending: Internal Medicine | Primary: Internal Medicine

## 2022-04-20 ENCOUNTER — Encounter

## 2022-04-20 DIAGNOSIS — I5022 Chronic systolic (congestive) heart failure: Secondary | ICD-10-CM

## 2022-04-20 LAB — CBC WITH AUTO DIFFERENTIAL
Basophils %: 0.5 %
Basophils Absolute: 0.1 10*3/uL (ref 0.0–0.2)
Eosinophils %: 3.2 %
Eosinophils Absolute: 0.4 10*3/uL (ref 0.0–0.6)
Hematocrit: 40.1 % — ABNORMAL LOW (ref 40.5–52.5)
Hemoglobin: 13 g/dL — ABNORMAL LOW (ref 13.5–17.5)
Lymphocytes %: 39.7 %
Lymphocytes Absolute: 4.5 10*3/uL (ref 1.0–5.1)
MCH: 27.5 pg (ref 26.0–34.0)
MCHC: 32.4 g/dL (ref 31.0–36.0)
MCV: 85.1 fL (ref 80.0–100.0)
MPV: 7.5 fL (ref 5.0–10.5)
Monocytes %: 7.9 %
Monocytes Absolute: 0.9 10*3/uL (ref 0.0–1.3)
Neutrophils %: 48.7 %
Neutrophils Absolute: 5.5 10*3/uL (ref 1.7–7.7)
Platelets: 163 10*3/uL (ref 135–450)
RBC: 4.71 M/uL (ref 4.20–5.90)
RDW: 16 % — ABNORMAL HIGH (ref 12.4–15.4)
WBC: 11.3 10*3/uL — ABNORMAL HIGH (ref 4.0–11.0)

## 2022-04-20 LAB — COMPREHENSIVE METABOLIC PANEL
ALT: 10 U/L (ref 10–40)
AST: 10 U/L — ABNORMAL LOW (ref 15–37)
Albumin/Globulin Ratio: 2.4 — ABNORMAL HIGH (ref 1.1–2.2)
Albumin: 4.3 g/dL (ref 3.4–5.0)
Alkaline Phosphatase: 107 U/L (ref 40–129)
Anion Gap: 10 (ref 3–16)
BUN: 20 mg/dL (ref 7–20)
CO2: 28 mmol/L (ref 21–32)
Calcium: 9 mg/dL (ref 8.3–10.6)
Chloride: 102 mmol/L (ref 99–110)
Creatinine: 1.1 mg/dL (ref 0.8–1.3)
Est, Glom Filt Rate: >60 (ref >60)
Glucose: 192 mg/dL — ABNORMAL HIGH (ref 70–99)
Potassium: 5.1 mmol/L (ref 3.5–5.1)
Sodium: 140 mmol/L (ref 136–145)
Total Bilirubin: 0.4 mg/dL (ref 0.0–1.0)
Total Protein: 6.1 g/dL — ABNORMAL LOW (ref 6.4–8.2)

## 2022-04-20 LAB — PSA SCREENING: PSA: 0.6 ng/mL (ref 0.00–4.00)

## 2022-04-20 LAB — LIPID PANEL
Cholesterol, Total: 129 mg/dL (ref 0–199)
HDL: 31 mg/dL — ABNORMAL LOW (ref 40–60)
LDL Calculated: 80 mg/dL (ref <100)
Triglycerides: 90 mg/dL (ref 0–150)
VLDL Cholesterol Calculated: 18 mg/dL

## 2022-04-20 LAB — TSH WITH REFLEX: TSH: 3.43 u[IU]/mL (ref 0.27–4.20)

## 2022-04-20 MED ORDER — ACCU-CHEK FASTCLIX LANCETS MISC
1 refills | Status: AC
Start: 2022-04-20 — End: ?

## 2022-04-20 MED ORDER — LEVOTHYROXINE SODIUM 100 MCG PO TABS
100 MCG | ORAL_TABLET | ORAL | 3 refills | Status: AC
Start: 2022-04-20 — End: 2023-04-03

## 2022-04-20 MED ORDER — ACCU-CHEK AVIVA PLUS VI STRP
ORAL_STRIP | 5 refills | Status: AC
Start: 2022-04-20 — End: ?

## 2022-04-20 MED ORDER — METFORMIN HCL 500 MG PO TABS
500 MG | ORAL_TABLET | ORAL | 1 refills | Status: DC
Start: 2022-04-20 — End: 2022-06-21

## 2022-04-20 MED ORDER — SITAGLIPTIN PHOSPHATE 100 MG PO TABS
100 MG | ORAL_TABLET | ORAL | 3 refills | Status: DC
Start: 2022-04-20 — End: 2022-07-20

## 2022-04-20 MED ORDER — GLIMEPIRIDE 2 MG PO TABS
2 MG | ORAL_TABLET | Freq: Two times a day (BID) | ORAL | 3 refills | Status: DC
Start: 2022-04-20 — End: 2023-04-19

## 2022-04-20 MED ORDER — LOSARTAN POTASSIUM-HCTZ 100-25 MG PO TABS
100-25 MG | ORAL_TABLET | Freq: Every day | ORAL | 1 refills | Status: AC
Start: 2022-04-20 — End: 2022-10-17

## 2022-04-20 MED ORDER — ATORVASTATIN CALCIUM 10 MG PO TABS
10 MG | ORAL_TABLET | Freq: Every day | ORAL | 1 refills | Status: AC
Start: 2022-04-20 — End: 2022-09-08

## 2022-04-20 NOTE — Assessment & Plan Note (Signed)
On synthroid,  Patient is compliant w medications, no side effects, effective, provides adequate symptom relief. No new symptoms or problems as noted by patient.  The problem is stable, no changes noted by patient. Will consider monitoring labs and refill medications as appropriate. Patient counseled and will continue current plan.

## 2022-04-20 NOTE — Assessment & Plan Note (Signed)
Diabetes Mellitus Type II:  Home blood sugar records reviewed: fasting range: 120-150 .  No significant episodes of hypoglycemia. Compliant with medications.  No polyuria, polydipsia, visual changes, foot problems, GI upset.  Diabetic diet compliance:  compliant most of the time.  Current exercise: none. Will check labs, and refill medications as appropriate.    Hypertension:  Denies CP, SOB, visual changes, dizziness, palpitations or HA.  He is adherent to a low sodium diet.  Blood pressure typically runs ok  outside of the office. Continue current medications.    Hyperlipidemia:  No new myalgias or GI upset on current medications.       Lab Results   Component Value Date    LABA1C 7.5 04/27/2021    LABA1C 8.7 (H) 03/28/2021    LABA1C 8.8 01/01/2021     Lab Results   Component Value Date    CREATININE 0.9 04/27/2021     Lab Results   Component Value Date    ALT 9 (L) 04/27/2021    AST 11 (L) 04/27/2021     No components found for: "CHLPL"  Lab Results   Component Value Date    TRIG 100 04/27/2021     Lab Results   Component Value Date    HDL 33 (L) 04/27/2021     Lab Results   Component Value Date/Time    LDLCALC 91 04/27/2021 04:05 PM      This problem was reviewed in detail. It is under control and stable. Will check blood work.

## 2022-04-20 NOTE — Assessment & Plan Note (Signed)
This is a chronic problem. The problem is well controlled.  Patient monitors readings regularly. Pertinent negatives include no chest pain, focal sensory loss, focal weakness, leg pain, myalgias or shortness of breath. No headaches or chest pain. Takes medications regularly.  Blood pressure has been stable, blood work was reviewed, and advised patient to continue the current instructions or medications.

## 2022-04-20 NOTE — Assessment & Plan Note (Signed)
Patient counseled,   Encouraged to lose weight, watch diet and exercise consistently.

## 2022-04-20 NOTE — Progress Notes (Signed)
Subjective:      Patient ID: Carl Blair is a 82 y.o. male.    HPI  Established patient here for a visit to manage acute and chronic medical conditions as detailed below.    Type 2 diabetes mellitus with hyperglycemia, without long-term current use of insulin (HCC)  Diabetes Mellitus Type II:  Home blood sugar records reviewed: fasting range: 120-150 .  No significant episodes of hypoglycemia. Compliant with medications.  No polyuria, polydipsia, visual changes, foot problems, GI upset.  Diabetic diet compliance:  compliant most of the time.  Current exercise: none. Will check labs, and refill medications as appropriate.    Hypertension:  Denies CP, SOB, visual changes, dizziness, palpitations or HA.  He is adherent to a low sodium diet.  Blood pressure typically runs ok  outside of the office. Continue current medications.    Hyperlipidemia:  No new myalgias or GI upset on current medications.       Lab Results   Component Value Date    LABA1C 7.5 04/27/2021    LABA1C 8.7 (H) 03/28/2021    LABA1C 8.8 01/01/2021     Lab Results   Component Value Date    CREATININE 0.9 04/27/2021     Lab Results   Component Value Date    ALT 9 (L) 04/27/2021    AST 11 (L) 04/27/2021     No components found for: "CHLPL"  Lab Results   Component Value Date    TRIG 100 04/27/2021     Lab Results   Component Value Date    HDL 33 (L) 04/27/2021     Lab Results   Component Value Date/Time    LDLCALC 91 04/27/2021 04:05 PM      This problem was reviewed in detail. It is under control and stable. Will check blood work.       Spinal stenosis of lumbar region with neurogenic claudication  S/p surgery , doing ok.      Morbid obesity due to excess calories [facility])  Patient counseled,   Encouraged to lose weight, watch diet and exercise consistently.       Essential hypertension  This is a chronic problem. The problem is well controlled.  Patient monitors readings regularly. Pertinent negatives include no chest pain, focal sensory loss, focal  weakness, leg pain, myalgias or shortness of breath. No headaches or chest pain. Takes medications regularly.  Blood pressure has been stable, blood work was reviewed, and advised patient to continue the current instructions or medications.       Acquired hypothyroidism  On synthroid,  Patient is compliant w medications, no side effects, effective, provides adequate symptom relief. No new symptoms or problems as noted by patient.  The problem is stable, no changes noted by patient. Will consider monitoring labs and refill medications as appropriate. Patient counseled and will continue current plan.      Pure hypercholesterolemia  This has been a long standing problem, takes lipitor       Monitors diet and tries to follow a low fat diet. Has  been reasonably  compliant w exercise. Lipids have been stable, The problem is controlled. Recent lipid tests were reviewed and are normal. Pertinent negatives include no chest pain, focal sensory loss, focal weakness, leg pain, myalgias or shortness of breath.  Advised patient to continue the current instructions or medications.      Review of Systems  ROS: No unusual headaches or allergy symptoms or blurred vision. No prolonged cough. No  flushing or facial pain or chest pain,dizziness, dyspnea, palpitations, or chest pain on exertion. No syncope. No nausea or vommitting or diarrhea.  No jaundice or abdominal pain, change in bowel habits, black or bloody stools.  No dysuria or hematuria or frequency of urination.   No myalgias or muscle pain.  No numbness, weakness, or tingling. No falls, or loss of consciousness. No weight loss or back pain. No falls.  No paresthesias. No joint swelling or redness. No joint pain. No recent weight loss. No focal weakness or sensory deficits or paresthesias, No confusion or altered sensorium. No hematemesis. No hearing loss. No siezures. All other systems were reviewed, and review was negative.   Objective:   Physical Exam  BP 122/80 (Site: Left  Upper Arm, Position: Sitting, Cuff Size: Medium Adult)   Temp 97.1 F (36.2 C)   Resp 12   Wt 115.7 kg (255 lb)   SpO2 99%   BMI 34.58 kg/m    The physical exam reveals a patient who appears well, alert and oriented x 3, pleasant, cooperative. Vitals are as noted. Head is atraumatic and normocephalic. Eyes reveal normal conjunctiva, cornea normal, pupils are equal and rective to light. Nasal mucosa is normal. Throat is normal without exudates. Ears reveal normal tympanic membranes.Neck is supple and free of adenopathy, or masses. No thyromegaly. No jugular venous distension. Lungs are clear to auscultation, no rales or rhonchi noted. Heart sounds are regular , no murmurs, clicks, gallops or rubs. Abdomen is soft, no tenderness, masses or organomegaly. Bowel sounds are normally heard.Pelvis: normal. Extremities are normal. Peripheral pulses are normal. Screening neurological exam is normal without focal findings. Cranial nerves are intact, reflexes are symmetrical and muscle strength eaqual. Skin is normal without suspicious lesions noted.   Assessment:      Type 2 diabetes mellitus with hyperglycemia, without long-term current use of insulin (HCC)  Diabetes Mellitus Type II:  Home blood sugar records reviewed: fasting range: 120-150 .  No significant episodes of hypoglycemia. Compliant with medications.  No polyuria, polydipsia, visual changes, foot problems, GI upset.  Diabetic diet compliance:  compliant most of the time.  Current exercise: none. Will check labs, and refill medications as appropriate.    Hypertension:  Denies CP, SOB, visual changes, dizziness, palpitations or HA.  He is adherent to a low sodium diet.  Blood pressure typically runs ok  outside of the office. Continue current medications.    Hyperlipidemia:  No new myalgias or GI upset on current medications.       Lab Results   Component Value Date    LABA1C 7.5 04/27/2021    LABA1C 8.7 (H) 03/28/2021    LABA1C 8.8 01/01/2021     Lab  Results   Component Value Date    CREATININE 0.9 04/27/2021     Lab Results   Component Value Date    ALT 9 (L) 04/27/2021    AST 11 (L) 04/27/2021     No components found for: "CHLPL"  Lab Results   Component Value Date    TRIG 100 04/27/2021     Lab Results   Component Value Date    HDL 33 (L) 04/27/2021     Lab Results   Component Value Date/Time    LDLCALC 91 04/27/2021 04:05 PM      This problem was reviewed in detail. It is under control and stable. Will check blood work.       Spinal stenosis of lumbar region with neurogenic claudication  S/p surgery , doing ok.      Morbid obesity due to excess calories [facility])  Patient counseled,   Encouraged to lose weight, watch diet and exercise consistently.       Essential hypertension  This is a chronic problem. The problem is well controlled.  Patient monitors readings regularly. Pertinent negatives include no chest pain, focal sensory loss, focal weakness, leg pain, myalgias or shortness of breath. No headaches or chest pain. Takes medications regularly.  Blood pressure has been stable, blood work was reviewed, and advised patient to continue the current instructions or medications.       Acquired hypothyroidism  On synthroid,  Patient is compliant w medications, no side effects, effective, provides adequate symptom relief. No new symptoms or problems as noted by patient.  The problem is stable, no changes noted by patient. Will consider monitoring labs and refill medications as appropriate. Patient counseled and will continue current plan.      Pure hypercholesterolemia  This has been a long standing problem, takes lipitor       Monitors diet and tries to follow a low fat diet. Has  been reasonably  compliant w exercise. Lipids have been stable, The problem is controlled. Recent lipid tests were reviewed and are normal. Pertinent negatives include no chest pain, focal sensory loss, focal weakness, leg pain, myalgias or shortness of breath.  Advised patient to  continue the current instructions or medications.          Plan:      Labs ordered, reviewed.   Medications refilled.   All Health maintenance needs reviewed and the needful ordered.           Olga Millers, MD

## 2022-04-20 NOTE — Assessment & Plan Note (Signed)
S/p surgery , doing ok.

## 2022-04-20 NOTE — Assessment & Plan Note (Signed)
This has been a long standing problem, takes lipitor       Monitors diet and tries to follow a low fat diet. Has  been reasonably  compliant w exercise. Lipids have been stable, The problem is controlled. Recent lipid tests were reviewed and are normal. Pertinent negatives include no chest pain, focal sensory loss, focal weakness, leg pain, myalgias or shortness of breath.  Advised patient to continue the current instructions or medications.

## 2022-04-21 LAB — HEMOGLOBIN A1C
Hemoglobin A1C: 7 %
eAG: 154.2 mg/dL

## 2022-04-25 NOTE — Care Coordination-Inpatient (Unsigned)
Ambulatory Care Coordination Note  04/25/2022    Patient Current Location:  Home: [address]  [place] [address]     ACM contacted the patient by telephone. Verified name and DOB with patient as identifiers. Provided introduction to self, and explanation of the ACM role.     Challenges to be reviewed by the provider   Additional needs identified to be addressed with provider: No  none               Method of communication with provider: chart routing.    ACM: Christia Reading, RN    {Insert Care Summary Note [id number]} ACM completed outreach call to patient who said he is agreeable to ongoing call with ACM. Patient said he is monitoring his daily weight at home. Patient said his weight does not fluctuate much and does not notice swelling. Patient is taking all medication as prescribed and no noted issues. Patient does not wear compression socks. Patient had no other concerns.     Plan:    F/U call 2 weeks  Offer RPM  Complete enrollment     Offered patient enrollment in the Remote Patient Monitoring (RPM) program for in-home monitoring:  Will discuss during next phone call .    Lab Results       None            Care Coordination Interventions    Referral from Primary Care Provider: No  Suggested Interventions and Community Resources          Goals Addressed    None         No future appointments. and   General Assessment

## 2022-04-27 NOTE — Progress Notes (Signed)
Formatting of this note is different from the original.   Patient: [name]    Outpatient Evaluation on 04/27/2022   DOB: December 01, 1940  PCP: Rob Bunting, MD    History from: Patient  Chief Complaint   Patient presents with    Follow-up    Atrial Fibrillation    Hypertension    Hyperlipidemia     History of Present Illness:  Mr. 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 Patient denies chest discomfort, dizziness, edema, palpitations, SOB.     No cardiac complaints. Unaware of any symptoms relating to atrial fibrillation.     Has had anemic, with hypochromic microcytic indices. EGD - minor lesions, colonoscopy - normal.    Has gained weight. Cannot exercise due to back pain.      Has undergone ablation for atrial fibrillation. No bleeding complications. Underwent, immediate post-ablation - cardioversion (09/03/2019).     Recently, admitted to [facility] (11/11/2019) with CHF. Diuresed for 16 lbs of fluids.  Echo - performed that admission - normal ventricular and valvular function. Has been experiencing leg cramps, while on Lasix.      Ambulatory BP's - 120's - 130's/70's - 80's.      PastCardiac History:  * Atrial fibrillation  * Hypertension benign  * Hyperlipidemia  * CHF  * Chest pain  * Sleep apnea, obstructive  * Coronary artery disease    Past Medical History:   Diagnosis Date    Acute diastolic congestive heart failure (HC CODE) 11/11/2019    Chest pain     Chronic low back pain     Coronary artery disease     40% L Cx    COVID-19 03/2021    COVID-19 vaccine series completed 06/2019    Pfizer; 06/16/19, 07/07/19    Diabetes (HC CODE)     Esophageal reflux     Essential hypertension     Heart failure (HC CODE)     Hiatal hernia     Hypothyroidism     Iron deficiency anemia due to chronic blood loss     Lumbar spine pain     With cyst    Malignancies (HC  CODE)     skin cancer    Methicillin resistant Staphylococcus aureus infection     mini Maze surgery, ~2011    Mixed hyperlipidemia     Myalgia     Obesity     Other gastritis with bleeding 06/11/2019    Paroxysmal atrial fibrillation (HC CODE)     Paroxysmal    Sleep apnea     CPAP     Past Surgical History:   Procedure Laterality Date    Cardioversion N/A 11/08/2019    CARDIOVERSION performed by Enrique Sack, MD at [facility] CATH LAB    Carpal Tunnel Release Bilateral     Colonoscopy  06/11/2019    Reportedly, normal    ECHO Historical  04/19/2017    Deborah Chalk; hx of sleep apnea - report in care everywhere    ELECTROPHYSIOLOGY STUDY-ABLATION-ATRIAL FIBRILLATION N/A 09/03/2019    ELECTROPHYSIOLOGY STUDY-ABLATION-ATRIAL FIBRILLATION performed by Enrique Sack, MD at [facility] CATH LAB    END EGD  06/11/2019    Patchy gastritis - s/p cautery    EXTRACAPSULAR CATARACT EXTRACTION,INTRAOCULAR LENS Left     OTHER      Mini-Maze  for atrial fibrillation    OTHER Bilateral     Repair of herniation thro' chest wall - as a complication of mini-Maze    OTHER Right     Resection of calcaneal spur    Septoplasty      SPINE SURGERY  03/2021    Resection of cyst from lower back    Tonsillectomy Adenoidectomy      Transesophageal ECHO (TEE)-Cardioversion N/A 05/30/2016    TRANSESOPHAGEAL ECHO (TEE)-CARDIOVERSION performed by Rexene Edison, MD at [facility] CATH LAB    UVULOPHARYNGOPALATOPLASTY (UPPP)       Outpatient Encounter Medications as of 04/27/2022   Medication Sig Dispense Refill    valsartan (DIOVAN) 320 mg tablet TAKE 1 TABLET DAILY 90 Tab 3    propafenone (RYTHMOL) 225 mg tablet Take 1 Tab by mouth every 8 hours 270 Tab 3    ferrous sulfate (FEOSOL) 325 mg (65 mg iron) tablet Take 325 mg by mouth two times a day       metFORMIN (GLUMETZA) 500 mg TG-tablet 24 hr Take 500 mg by mouth twice a day before meals       sitaGLIPtin (JANUVIA) 100 mg tablet Take 100 mg by mouth daily.      atorvastatin (LIPITOR) 10 mg tablet Take 10 mg by  mouth at bedtime.      carvedilol (COREG) 25 mg tablet Take 25 mg by mouth two times a day.      levothyroxine (SYNTHROID) 100 mcg tablet Take 1 Tab by mouth daily before breakfast.      GLUCOSAMINE HCL/CHONDR SU A NA (OSTEO BI-FLEX ORAL) Take 1 Tab by mouth daily at 6pm       omeprazole (PRILOSEC) 40 mg DR-capsule Take 40 mg by mouth daily.      glimepiride (AMARYL) 2 mg tablet Take 2 mg by mouth two times a day       apixaban (ELIQUIS) 5 mg tablet Take 1 Tab by mouth two times a day 180 Tab 3    [DISCONTINUED] tamsulosin (FLOMAX) 0.4 mg SR-capsule Take 0.4 mg by mouth daily (Patient not taking: Reported on 04/27/2022)      [DISCONTINUED] aspirin (ASA EC) 81 mg enteric-coated tablet Take  by mouth daily. (Patient not taking: Reported on 04/27/2022)       No facility-administered encounter medications on file as of 04/27/2022.     Family History   Problem Relation Name Age of Onset    Heart attack Mother      Heart attack Father      Diabetes Sister      Heart Disease Brother      Anesthesia Problems Neg Hx       Social History     Socioeconomic History    Marital status: Married     Spouse name: Not on file    Number of children: 0    Years of education: 12    Highest education level: 12th grade   Occupational History    Occupation: Location manager     Comment: [employer] - Retired   Tobacco Use    Smoking status: Never    Smokeless tobacco: Never   Vaping Use    Vaping Use: Never used   Substance and Sexual Activity    Alcohol use: No    Drug use: No    Sexual activity: Not on file   Other Topics Concern    Not on file   Social History Narrative    Not on file  Social Determinants of Health     Financial Resource Strain: Low Risk  (03/14/2017)    Overall Financial Resource Strain (CARDIA)     Difficulty of Paying Living Expenses: Not hard at all   Food Insecurity: Unknown (03/14/2017)    Hunger Vital Sign     Worried About Programme researcher, broadcasting/film/video in the Last Year: Patient declined     Ran Out of Food in the Last Year:  Patient declined   Transportation Needs: Unknown (03/14/2017)    PRAPARE - Therapist, art (Medical): Patient declined     Lack of Transportation (Non-Medical): Patient declined   Physical Activity: Sufficiently Active (04/27/2022)    Exercise Vital Sign     Days of Exercise per Week: 7 days     Minutes of Exercise per Session: 30 min   Stress: No Stress Concern Present (04/12/2017)    [employer] of Occupational Health - Occupational Stress Questionnaire     Feeling of Stress : Not at all   Social Connections: Not on file   Intimate Partner Violence: Not on file   Housing Stability: Not on file     Cardiovascular Risk Factors:   HTN  HPL    Fourteen-point review of systems is done and is otherwise negative except as noted in the chart.  Review of Systems   Constitutional: Negative for chills and fever.   HENT:  Negative for ear pain and sore throat.    Eyes:  Negative for pain and visual disturbance.   Cardiovascular:  Positive for dyspnea on exertion. Negative for chest pain, leg swelling and palpitations.   Respiratory:  Negative for cough, shortness of breath and wheezing.    Skin:  Negative for color change and rash.   Musculoskeletal:  Positive for muscle cramps and muscle weakness. Negative for back pain and myalgias.   Gastrointestinal:  Negative for abdominal pain, nausea and vomiting.   Genitourinary:  Negative for dysuria and hematuria.   Neurological:  Negative for dizziness, light-headedness, seizures and tremors.   Allergic/Immunologic: Negative.      Vital Signs:   Ht: 1.829 m (6') Wt: 112.9 kg (249 lb)  BMI: Body mass index is 33.77 kg/m.   P: 68, Rhythm: Regular   BP: 132/80 Position: Sitting  Weight management and counseling:  Estimated body mass index is 33.77 kg/m as calculated from the following:    Height as of this encounter: 1.829 m (6').    Weight as of this encounter: 112.9 kg (249 lb)..    CMS Normal Parameters: Age 59 years and older BMI =>18.5 and  <25kg/m2  This was discussed with him.  Body mass index is 33.77 kg/m.    Physical Exam  Vitals reviewed.   Constitutional:       Appearance: He is well-developed.   HENT:      Head: Normocephalic and atraumatic.   Eyes:      Pupils: Pupils are equal, round, and reactive to light.   Cardiovascular:      Rate and Rhythm: Normal rate and regular rhythm.   Pulmonary:      Effort: Pulmonary effort is normal.      Breath sounds: Normal breath sounds.   Abdominal:      General: Bowel sounds are normal.      Palpations: Abdomen is soft.   Musculoskeletal:         General: Normal range of motion.      Cervical back: Normal range  of motion and neck supple.   Skin:     General: Skin is warm and dry.   Neurological:      Mental Status: He is alert and oriented to person, place, and time.   Psychiatric:         Behavior: Behavior normal.         Judgment: Judgment normal.     Cardiac tests:   ECG - 04/27/2022  Baseline artifact may preclude accurate interpretation.   Normal sinus rhythm.   1st degree AV block.     ECG - 02/15/2021  Atrial fibrillation with controlled ventricular response.  No acute ischemic changes.    ECG - 11/27/2019  Normal sinus rhythm, alternating 6 beats of atrial fibrillation    Holter 12/09/2019  Normal sinus rhythm at an average heart rate of approx 79 bpm (range: 57 - 142 bpm).   Frequent supraventricular ectopy - isolated PAC's, atrial couplets (13.67%). Short runs of p SVT - longest - 31 beats, fastest - 117 bpm.   Rare, isolated PVC's (0.15%).   No ST segment changes seen.   Symptom of shortness of breath associated with NSR, no ectopy    Echo - 11/11/2019  Left ventricle: The cavity size is normal. Wall thickness was increased increased in a pattern of   mild to moderate LVH. Systolic function was normal. The estimated ejection fraction was in the range of 55% to 60%. Wall motion was normal; there were no regional wall motion abnormalities.     Abnormal relaxation with increased filling pressures.    - Right ventricle: Systolic function was normal. TAPSE: 1.8cm.Tricuspid annular systolic velocity: 9.5cm/s.    Echo - 02/18/16  - Left ventricle: The cavity size was normal. Wall thickness was    normal. Systolic function was normal. The estimated ejection    fraction was in the range of 60% to 65%. Wall motion was normal;    there were no regional wall motion abnormalities.  - Mitral valve: Mild regurgitation.  - Left atrium: The atrium was mildly dilated.  - Right ventricle: Systolic function was normal.    Holter monitor: 02/11/16  NSR, frequent supraventricular arrhythmias, very rare isolated  PVC's.  No pauses greater than 2 seconds were recorded    NST -    Normal coronary perfusion, Normal LV/RV function - LVEF - 53%.     FLP - 11/14/2019 (05/07/2019, 09/01/17)  TC - 114 (96, 124), HDL - 24 (30, 31), LDL - 70 (51, 76), TG - 98 (73, 83)     Impression:  1) Coronary artery disease - Mild - 40% stenosis in the right coronary artery. Cont with aggressive risk factor modification.    2) Hypertension benign - Better controlled, aided by his weight loss. Cont Coreg - 25 mg bid, Valsartan 320 mg/day.      3) Hyperlipidemia - Controlled, except low HDL. Cont Atorvastatin - 10 mg/day + aggressive lifestyle modification, comprising of dietary restriction/modification and exercises, with a goal towards weight reduction to his ideal body weight.      4) Atrial fibrillation, hx of paroxysmal, now, prob. permanent - Recurrent, despite antiarrhythmic Rx. Asymptomatic. S/p mini-Maze procedure. S/p RFA (08/2019). Cont Coreg - 25 mg bid & Propafenone.     5) Sleep apnea, obstructive - Encouraged to use CPAP.      6) Chest pain - Nonanginal.  Highly unusual situation. Had herniation of the lung parenchyma thro' the rent left in his bilateral anterior chest following Mini-Maze procedure for  parox. atrial fibrillation. S/p repair of hernia by Dr. Sheppard Penton.    7) Statin induced myalgias and myopathy - Resolved since DC'ed. Elevated CPK on  Pravastatin - DC'ed.  Now resumed back on Atorvastatin.  Appears to be tolerating this better. Cont with aggressive risk factor modification.     8) Obesity - Counseled on dietary restriction/modification and exercises, with goal towards weight reduction to his ideal body weight. Has started exercises and following Nutrisystem diet plan.     9) Anemia - Hypochromic, microcytic. EGD - minor gastric lesions (report unavailable) - cauterized. Normal colonoscopy Alfredo Martinez).      Inquired about Watchman, in the past - discussed. Made him aware of recommendations. Given recent hospitalizations for ablation and CHF, wishes to hold off on any further procedures, for now.     Meaningful Use:    Coronary Artery Disease  Assessment   CCS angina class: no angina   There is dyspnea on exertion which may be an anginal equivalent      Plan    Continue current program    Atrial Fibrillation and Atrial Flutter  Assessment   The patient has atrial fibrillation-initial episode   The patient's CHA2DS2 VASc score is 3   A CHA2DS2 VASc score of 3 is associated with a yearly stroke rate of 3.2% off warfarin.   The patient's current rhythm is normal   S/p mini-Maze.     Hyperlipidemia  Assessment   The patient's HDL goal is >40 mg/dL   The patient's LDL goal is 100 mg/dL or less   The patient is close to goal on current medications    Plan   Continue current therapy   Goals of therapy were discussed with the patient today   Lifestyle management was discussed with the patient today including adhering to a healthy diet, regular exercise, avoidance of tobacco products and maintenance of a healthy weight.    Hypertension  Assessment    BP currently controlled    Plan    Continue current hypertension plan    Adjust medications    It has been a pleasure being able to participate in Mr. Broker care. Please do not hesitate to contact me if I may be of any further assistance.     Sincerely,     Eda Keys, MD  [phone] (Answering  service)      Electronically signed by Eda Keys, MD at 04/27/2022  3:18 PM EST

## 2022-05-09 NOTE — Care Coordination-Inpatient (Signed)
Ambulatory Care Coordination Note  05/09/2022    Patient Current Location:  Home: [address]  [place] [address]    ACM contacted the patient by telephone. Verified name and DOB with patient as identifiers. Provided introduction to self, and explanation of the ACM role.     ACM: Verneita Griffes, RN    Challenges to be reviewed by the provider   Additional needs identified to be addressed with provider: No  none               Method of communication with provider: chart routing.     ACM completed follow up call with patient who said he is doing well at home. Patient said he is independent and ordered a Hello Fresh Meal kit to help him prepare meals for himself. Patient said he has minimal help but is still able to do everything himself. Patient said he has no other concerns at this time.     Plan:    F/U call 3 weeks      Offered patient enrollment in the Remote Patient Monitoring (RPM) program for in-home monitoring: Patient declined.    Ambulatory Care Coordination Assessment    Care Coordination Protocol  Referral from Primary Care Provider: No  Week 1 - Initial Assessment     Do you have all of your prescriptions and are they filled?: Yes  Barriers to medication adherence: None  Are you able to afford your medications?: Yes  How often do you have trouble taking your medications the way you have been told to take them?: I always take them as prescribed.     Do you have Home O2 Therapy?: No      Ability to seek help/take action for Emergent Urgent situations i.e. fire, crime, inclement weather or health crisis.: Independent  Ability to ambulate to restroom: Independent  Ability handle personal hygeine needs (bathing/dressing/grooming): Independent  Ability to manage Medications: Independent  Ability to prepare Food Preparation: Independent  Ability to maintain home (clean home, laundry): Independent  Ability to drive and/or has transportation: Independent  Ability to do shopping: Independent  Ability  to manage finances: Independent  Is patient able to live independently?: Yes     Current Housing: Private Residence  Who do you live with?: Alone  Are you an active caregiver in your home?: No     Do you have any DME?: Yes  Patient DME: [facility] Rile     Per the Fall Risk Screening, did the patient have 2 or more falls or 1 fall with injury in the past year?: No        Are you experiencing loss of meaning?: No  Are you experiencing loss of hope and peace?: No     Suggested Interventions and Community Resources   Zone Management Tools: In Process (Comment: CHF)         Other Interventions: Set up/Review an Education Plan, Set up/Review Goals              No future appointments.  ,   Diabetes Assessment    Medic Alert ID: Yes  Meal Planning: Calorie counting, Carb counting   How often do you test your blood sugar?: No Testing   Do you have barriers with adherence to non-pharmacologic self-management interventions? (Nutrition/Exercise/Self-Monitoring): No   Have you ever had to go to the ED for symptoms of low blood sugar?: No       No patient-reported symptoms        ,  Congestive Heart Failure Assessment    Are you currently restricting fluids?: 2000cc  Do you understand a low sodium diet?: Yes  Do you understand how to read food labels?: Yes  How many restaurant meals do you eat per week?: 0  Do you salt your food before tasting it?: No     No patient-reported symptoms      Symptoms:          , and   General Assessment    Do you have any symptoms that are causing concern?: No

## 2022-05-09 NOTE — Telephone Encounter (Signed)
I agree with the Care Coordinator's Plan of Care

## 2022-05-31 NOTE — ACP (Advance Care Planning) (Signed)
Advance Care Planning   Healthcare Decision Maker:    Primary Decision Maker: Nishanth, [place] - [id number]    Click here to complete Healthcare Decision Makers including selection of the Healthcare Decision Maker Relationship (ie "Primary").  Today we referred to ACP Clinical Specialist for assistance.       If the relationship to the patient does NOT follow our state's Next of Kin hierarchy, the patient MUST complete an ACP Document to allow him/her to act on the patient's behalf. :       The patient said that [place] primary decision maker has passed away and needs to update this.

## 2022-05-31 NOTE — Care Coordination-Inpatient (Unsigned)
Ambulatory Care Coordination Note  05/31/2022    Patient Current Location:  Home: [address]  [place] [address]     ACM contacted the patient by telephone. Verified name and DOB with patient as identifiers. Provided introduction to self, and explanation of the ACM role.     Challenges to be reviewed by the provider   Additional needs identified to be addressed with provider: No  none               Method of communication with provider: chart routing.    ACM: Christia Reading, RN    {Insert Care Summary Note [id number]} ACM completed follow up call with patient who said he did not like the Hello Fresh meals. Patient said he is eating more snacks such as fruit, vegetables, and low sodium chips. ACM reviewed ACP decision maker and patient said [name] had passed away and this needed to be updated. ACM said she would refer to SW for ACP dcoument review and get this updated. Patient thanked ACM at this time and had no further questions.     Plan:    Route to SW for ACP documents    Offered patient enrollment in the Remote Patient Monitoring (RPM) program for in-home monitoring: Patient declined.    Lab Results       None            Care Coordination Interventions    Referral from Primary Care Provider: No  Suggested Interventions and Community Resources  Social Work: In Nutritional therapist (Comment: ACP referral)  Zone Management Tools: In Process (Comment: CHF)          Goals Addressed    None         No future appointments.,   Diabetes Assessment    Medic Alert ID: Yes  Meal Planning: Calorie counting, Carb counting   How often do you test your blood sugar?: No Testing   Do you have barriers with adherence to non-pharmacologic self-management interventions? (Nutrition/Exercise/Self-Monitoring): No   Have you ever had to go to the ED for symptoms of low blood sugar?: No            ,   Congestive Heart Failure Assessment    Are you currently restricting fluids?: 2000cc  Do you understand a low sodium diet?: Yes  Do you  understand how to read food labels?: Yes  How many restaurant meals do you eat per week?: 0  Do you salt your food before tasting it?: No     No patient-reported symptoms      Symptoms:          , and   General Assessment

## 2022-06-01 NOTE — Care Coordination-Inpatient (Signed)
SW received ACP referral from ACM. SW placed call to patient to discuss; left voicemail and requested return call. Will continue to follow.    Violet Baldy MSW, [place]  Social Worker   [phone]

## 2022-06-08 NOTE — Care Coordination-Inpatient (Signed)
SW placed call to patient to discuss ADs; left voicemail and requested return call. Will continue to follow.     Violet Baldy MSW, [place]  Social Worker   [phone]

## 2022-06-13 NOTE — Care Coordination-Inpatient (Signed)
Ambulatory Care Coordination Note  06/13/2022    Patient Current Location:  Home: [address]  [place] [address]     ACM contacted the patient by telephone. Verified name and DOB with patient as identifiers. Provided introduction to self, and explanation of the ACM role.     Challenges to be reviewed by the provider   Additional needs identified to be addressed with provider: No  none               Method of communication with provider: chart routing.    ACM: Verneita Griffes, RN     ACM completed follow up call with patient who said he is doing well at this time. Patient said he is eating out for most meals at this time. Patient said his blood sugars are 130-150s when he checks them. Patient completed/ updated his ACP documents last week. Patient said he has no further concerns at this time.     Plan:    F/U call 4 weeks  Mail out Zone tools      Offered patient enrollment in the Remote Patient Monitoring (RPM) program for in-home monitoring: Patient declined.    Lab Results       None            Care Coordination Interventions    Referral from Primary Care Provider: No  Suggested Interventions and Community Resources  Social Work: In Dentist (Comment: ACP referral)  Zone Management Tools: In Process (Comment: CHF)          Goals Addressed                   This Visit's Progress     Conditions and Symptoms        I will schedule office visits, as directed by my provider.  I will keep my appointment or reschedule if I have to cancel.  I will notify my provider of any barriers to my plan of care.  I will follow my Zone Management tool to seek urgent or emergent care.  I will notify my provider of any symptoms that indicate a worsening of my condition.    Barriers: lack of support and time constraints  Plan for overcoming my barriers: ACM will engage patient with care management phone calls, review zone tools, and review low sodium diet.   Confidence: 8/10  Anticipated Goal Completion Date: 09/13/22                 No future appointments. and   General Assessment

## 2022-06-13 NOTE — Telephone Encounter (Signed)
I agree with the Care Coordinator's Plan of Care

## 2022-06-15 NOTE — Care Coordination-Inpatient (Signed)
SW reviewed ACM note stating that patient completed/updated AD paperwork. No further outreach planned at this time.     Violet Baldy MSW, [place]  Social Worker   [phone]

## 2022-06-21 MED ORDER — ELIQUIS 5 MG PO TABS
5 | ORAL_TABLET | ORAL | 0 refills | Status: DC
Start: 2022-06-21 — End: 2022-07-04

## 2022-06-21 MED ORDER — CARVEDILOL 25 MG PO TABS
25 MG | ORAL_TABLET | ORAL | 0 refills | Status: AC
Start: 2022-06-21 — End: 2022-07-14

## 2022-06-21 MED ORDER — METFORMIN HCL 500 MG PO TABS
500 MG | ORAL_TABLET | ORAL | 0 refills | Status: AC
Start: 2022-06-21 — End: 2022-10-10

## 2022-06-21 NOTE — Telephone Encounter (Signed)
Medication:   Requested Prescriptions     Pending Prescriptions Disp Refills    carvedilol (COREG) 25 MG tablet [Pharmacy Med Name: CARVEDILOL '25MG'$  TABS] 180 tablet 0     Sig: To be filled by Provider    ELIQUIS 5 MG TABS tablet [Pharmacy Med Name: Eliquis Oral Tab 5 Mg] 180 tablet 0     Sig: To be filled by Provider    metFORMIN (GLUCOPHAGE) 500 MG tablet [Pharmacy Med Name: METFORMIN HCL '500MG'$  TABS] 360 tablet 0     Sig: To be filled by Provider     Last Filled:  07/05/2021    Last appt: 04/20/2022   Next appt: Visit date not found    Last OARRS:       03/24/2021    12:59 PM   RX Monitoring   Periodic Controlled Substance Monitoring Possible medication side effects, risk of tolerance/dependence & alternative treatments discussed.;No signs of potential drug abuse or diversion identified.

## 2022-07-04 ENCOUNTER — Ambulatory Visit: Payer: Medicare (Managed Care) | Attending: Acute Care

## 2022-07-04 MED ORDER — APIXABAN 5 MG PO TABS
5 MG | ORAL_TABLET | ORAL | 0 refills | Status: DC
Start: 2022-07-04 — End: 2022-07-20

## 2022-07-04 NOTE — Telephone Encounter (Signed)
From: Joaquin Courts  To: Dr. Olga Millers  Sent: 07/02/2022 11:19 AM EDT  Subject: prescription    I need a refill on ELIQIUS as soon as possible. [facility] SAYS THEY HAVE been trying to reach you. Koleson Eichenberger

## 2022-07-11 NOTE — Care Coordination-Inpatient (Signed)
ACM called but patient did not answer. ACM left voice message with call back number provided. ACM will follow up at a later date.

## 2022-07-14 MED ORDER — FUROSEMIDE 20 MG PO TABS
20 | ORAL_TABLET | Freq: Every day | ORAL | 5 refills | 60.00000 days | Status: DC
Start: 2022-07-14 — End: 2024-03-05

## 2022-07-14 MED ORDER — CARVEDILOL 25 MG PO TABS
25 MG | ORAL_TABLET | ORAL | 0 refills | Status: DC
Start: 2022-07-14 — End: 2022-07-20

## 2022-07-14 NOTE — Telephone Encounter (Signed)
Medication:   Requested Prescriptions     Pending Prescriptions Disp Refills    furosemide (LASIX) 20 MG tablet 30 tablet 5     Sig: Take 1 tablet by mouth daily    carvedilol (COREG) 25 MG tablet 180 tablet 0     Sig: To be filled by Provider     Last Filled:  10/25/2021    Last appt: 04/20/2022   Next appt: Visit date not found    Last OARRS:       03/24/2021    12:59 PM   RX Monitoring   Periodic Controlled Substance Monitoring Possible medication side effects, risk of tolerance/dependence & alternative treatments discussed.;No signs of potential drug abuse or diversion identified.

## 2022-07-14 NOTE — Telephone Encounter (Signed)
Medication:   Requested Prescriptions     Pending Prescriptions Disp Refills    carvedilol (COREG) 25 MG tablet [Pharmacy Med Name: Carvedilol Oral Tablet 25 Mg (Zyd)]       Last Filled:  07/14/2022    Last appt: 04/20/2022   Next appt: Visit date not found    Last OARRS:       03/24/2021    12:59 PM   RX Monitoring   Periodic Controlled Substance Monitoring Possible medication side effects, risk of tolerance/dependence & alternative treatments discussed.;No signs of potential drug abuse or diversion identified.

## 2022-07-20 MED ORDER — CARVEDILOL 25 MG PO TABS
25 MG | ORAL_TABLET | ORAL | 1 refills | Status: AC
Start: 2022-07-20 — End: 2022-10-10

## 2022-07-20 MED ORDER — APIXABAN 5 MG PO TABS
5 MG | ORAL_TABLET | ORAL | 1 refills | Status: AC
Start: 2022-07-20 — End: 2022-10-17

## 2022-07-20 MED ORDER — SITAGLIPTIN PHOSPHATE 100 MG PO TABS
100 MG | ORAL_TABLET | ORAL | 3 refills | Status: AC
Start: 2022-07-20 — End: 2023-02-16

## 2022-07-20 NOTE — Telephone Encounter (Signed)
Spoke with pharmacist given verbal order.

## 2022-07-20 NOTE — Telephone Encounter (Signed)
[id number] - [place], [place] called to get clarity of meds refill notes.  'To be filled by Provider '  carvedilol (COREG) 25 MG tablet [[id number]]   apixaban (ELIQUIS) 5 MG TABS tablet [[id number]]   Please call [facility] [phone] [address] [place], [place] - [address] - P [phone] - F [phone].

## 2022-07-20 NOTE — Telephone Encounter (Signed)
Pt is having difficulty getting his medication from the mail order pharmacy. They states we need to re send it because its in complete     In the mean time pt is out of medication and need a temporary fill send to his local pharmacy     [facility] [id number] - [place], [place] - [address] - P [phone] - F [phone]

## 2022-07-21 NOTE — Telephone Encounter (Signed)
From: [name]  To: Dr. Marcello Fennel  Sent: 07/21/2022 3:22 PM EDT  Subject: prescriptions    I need a prescription for eliquis & coreg [facility] [address] [place] i NEED THESE MEDICINES ASAP iSENT A MESSAGE 07/20/2022 HAVEN'T HEARD anything.

## 2022-07-26 NOTE — Care Coordination-Inpatient (Signed)
Ambulatory Care Coordination Note  07/26/2022    Patient Current Location:  Home: [address]  [place] [address]     ACM contacted the patient by telephone. Verified name and DOB with patient as identifiers. Provided introduction to self, and explanation of the ACM role.     Challenges to be reviewed by the provider   Additional needs identified to be addressed with provider: No  none               Method of communication with provider: chart routing.    ACM: Christia Reading, RN     ACM completed follow up with patient who said he is doing well at this time. Patient said he has no further needs or concerns at this time. ACM encouraged patient to call if this were to change.     Plan:    Graduate from CC    Offered patient enrollment in the Remote Patient Monitoring (RPM) program for in-home monitoring: Patient is not eligible for RPM program.    Lab Results       None            Care Coordination Interventions    Referral from Primary Care Provider: No  Suggested Interventions and Community Resources  Social Work: In Nutritional therapist (Comment: ACP referral)  Zone Management Tools: In Process (Comment: CHF)          Goals Addressed                   This Visit's Progress     COMPLETED: Conditions and Symptoms        I will schedule office visits, as directed by my provider.  I will keep my appointment or reschedule if I have to cancel.  I will notify my provider of any barriers to my plan of care.  I will follow my Zone Management tool to seek urgent or emergent care.  I will notify my provider of any symptoms that indicate a worsening of my condition.    Barriers: lack of support and time constraints  Plan for overcoming my barriers: ACM will engage patient with care management phone calls, review zone tools, and review low sodium diet.   Confidence: 8/10  Anticipated Goal Completion Date: 09/13/22                No future appointments.,   Diabetes Assessment    Medic Alert ID: Yes  Meal Planning: Calorie  counting, Carb counting   How often do you test your blood sugar?: Daily   Do you have barriers with adherence to non-pharmacologic self-management interventions? (Nutrition/Exercise/Self-Monitoring): No   Have you ever had to go to the ED for symptoms of low blood sugar?: No            ,   Congestive Heart Failure Assessment    Are you currently restricting fluids?: 2000cc  Do you understand a low sodium diet?: Yes  Do you understand how to read food labels?: Yes  How many restaurant meals do you eat per week?: 0  Do you salt your food before tasting it?: No     No patient-reported symptoms      Symptoms:          , and   General Assessment    Do you have any symptoms that are causing concern?: No

## 2022-07-26 NOTE — Telephone Encounter (Signed)
I agree with the Care Coordinator's Plan of Care

## 2022-08-01 NOTE — Telephone Encounter (Signed)
Contacted patient and rescheduled appointment for 08/02/22.

## 2022-08-01 NOTE — Telephone Encounter (Signed)
Patient is requesting a call back to reschedule 07/04/22 appointment before 03/01/23 (next available to central scheduling).

## 2022-08-02 ENCOUNTER — Ambulatory Visit: Admit: 2022-08-02 | Discharge: 2022-08-02 | Payer: Medicare (Managed Care) | Attending: Acute Care

## 2022-08-02 DIAGNOSIS — R063 Periodic breathing: Secondary | ICD-10-CM

## 2022-08-02 NOTE — Progress Notes (Signed)
NAME: [name]  MRN: [id number]  Author:Raymondo Garcialopez ANNETTE Novi Calia  Date:08/02/2022    Last seen 06/30/2021 by Myself    CHIEF COMPLAINT:   82 y.o. 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 He has used the ASV machine 29 out of the past 30 nights. His average nightly use is 7 hours and 33 minutes with an AHI of 1.6.  No issues receiving supplies.  Denies machine malfunctions. He is happy wearing the full mask.  No reports of snoring, nasal congestion or rainout.  +tolerating the pressure well. + Improved sleep comfort with PAP therapy.  +Able to operate his vehicle and complete daily tasks without drowsiness.     Bedtime: 11 PM  SLT: 15  minutes  Awakenings during the night: 1-2 to use the restroom  Wake up: 7 AM   Naps: Yes 20 minutes/daily    Mood is good    No other sleep concerns.    Stable weight.     PAP Data Download:  Equipment:  ResMed -  min EPAP 6, max EPAP 15, min pressure support 4, Max pressure support 15 cwp   Mask: Full  No chinstrap  Equipment download:   Data date range:07/02/2022-07/31/2022  Downloaded data shows that over 30 nights the patient has used the device 29 nights.   The average use on nights used = 7 hours 33 minutes.  The percentage of nights used more than 4 hours =97%.  The machine reported AHI is 1.6.  95th percentile leak was 55.3L/minute. IPAP Pressure: Median: 13.7, 95th percentile:18.2 and Maximum:21.8. EPAP Pressure: Median: 7.3, 95th percentile:9.7 and Maximum:10.9  Uses a so clean    Prior Diagnostic Testing:    [facility]AP [facility]  Date of CPAP/BPAP (78469): 03/09/17  CmH2O Pressure prescribed: 7/7/0/15/25  AHI at Prescribed:4.6  Lowest SpO2% at Prescribed:83%  PLMI:     PLMAI:  Weight: 257 pounds  Other:    Date of CPAP: 12/06/2008   SE %: 76.5   AHI: 1.1   Lowest p ox %: 90.0   CPAP performed at: [facility] @ time of study:  265 lbs   DME Company: Geophysicist/field seismologist type: full face   cmH2O: 13     Date of NPSG: 05/27/2003   SE %: 63.5   AHI: 52   Lowest p ox %: 73   NPSG performed at: [facility]     Wt Readings from Last 3 Encounters:   08/02/22 (!) 257 lb (116.6 kg)   10/25/21 (!) 255 lb (115.7 kg)   06/30/21 (!) 257 lb (116.6 kg)       PAST MEDICAL HISTORY:  Surgical history:  Past Surgical History:   Procedure Laterality Date    COLONOSCOPY W/ POLYPECTOMY      benign  has at 3 plus procedures    HERNIA REPAIR  9/09    right inguinal, mini maze    HERNIA REPAIR  2/10    herniated lungs bilateral (repaired with mesh)    mini-maze      corrected his A-fib    rt eye  02/2018    SEPTOPLASTY      UVULOPALATOPHARYNGOPLASTY      UVULOPALATOPHARYNGOPLASTY      has had esophageal dilations in the past (Dr.Decter)    WISDOM TOOTH EXTRACTION  Medical history:  Past Medical History:   Diagnosis Date    Atrial fibrillation (CMS-HCC)     corrected with Mini-maze procedure    Cataract     left eye    Diabetes mellitus (CMS-HCC)     type 2    Dilated cardiomyopathy (CMS-HCC)     Hearing loss     left ear    Hypercholesteremia     Hypertension     Sleep apnea     Thyroid disease     hypothyroidism       Medications:  Current Outpatient Medications   Medication Sig    apixaban Take 1 tablet (5 mg total) by mouth 2 times a day.    aspirin Take 1 tablet (81 mg total) by mouth daily.    atorvastatin Take 1 tablet (10 mg total) by mouth at bedtime.    carvediloL Take 1 tablet (25 mg total) by mouth 2 times a day.    ferrous sulfate Take 1 tablet (325 mg total) by mouth daily with breakfast.    furosemide Take 1 tablet (20 mg total) by mouth daily.    glimepiride Take 1 tablet by mouth  every morning before  breakfast    GLUC/CHON-MSM#1/C/MANG/BOS/BOR (OSTEO BI-FLEX ORAL) Take by mouth. OSTEO BI-FLEX JOINT SHIELD TABS     glyBURIDE-metformin Take 2 tablets by mouth 2 times a day.    levothyroxine Take 1 tablet (100 mcg total) by mouth every morning.     omeprazole Take 1 capsule (40 mg total) by mouth every morning.    propafenone Take 1 tablet (225 mg total) by mouth 3 times a day.    tamsulosin Take 1 capsule (0.4 mg total) by mouth at bedtime.    valsartan Take 1 tablet (320 mg total) by mouth daily.     No current facility-administered medications for this visit.         Allergies:House dust    Family history:  Family History   Problem Relation Age of Onset    Diabetes Mother         type 2    Hypertension Mother     Coronary artery disease Sister     Diabetes Sister         type 2    Coronary artery disease Sister     Coronary artery disease Brother     Hypertension Brother     Diabetes Brother         type 2    Hypertension Brother        Social history:  Social History     Socioeconomic History    Marital status: Married     Spouse name: None    Number of children: None    Years of education: None    Highest education level: None   Occupational History    None   Tobacco Use    Smoking status: Never    Smokeless tobacco: Never    Tobacco comments:     03/19/07   Substance and Sexual Activity    Alcohol use: No    Drug use: No     Comment: 04-14-11    Sexual activity: None   Other Topics Concern    Caffeine Use No    Occupational Exposure Yes     Comment: retired Energy manager    Exercise Yes    Seat Belt Yes   Social History Narrative    None     Social Determinants of  Health     Financial Resource Strain: Not on file   Food Insecurity: No Food Insecurity (08/02/2022)    Yearly Questionnaire     Do you need any assistance with obtaining housing, meals, medication, transportation or medical equipment?: No     Assistance needed for:: Not on file   Transportation Needs: No Transportation Needs (08/02/2022)    Yearly Questionnaire     Do you need any assistance with obtaining housing, meals, medication, transportation or medical equipment?: No     Assistance needed for:: Not on file   Physical Activity: Not on file   Stress: Not on file   Social Connections:  Not on file   Intimate Partner Violence: Not on file   Housing Stability: Low Risk  (08/02/2022)    Yearly Questionnaire     Do you need any assistance with obtaining housing, meals, medication, transportation or medical equipment?: No     Assistance needed for:: Not on file       Review of Systems    Constitutional: Negative.  Negative for fatigue.   HENT: Negative.  Negative for congestion, dry mouth and sore throat.    Cardiovascular: Negative.  Negative for chest pain, palpitations and leg swelling.   Psychiatric/Behavioral: Negative.  Negative for decreased concentration and depression. The patient is not anxious.    Gastrointestinal: Negative.  Negative for heartburn.   Genitourinary:  Positive for Awaken to urinate. Negative for Unable to control urine, frequency and urgency.   Musculoskeletal:  Negative for arthralgias, back pain and myalgias.   Skin: Negative.  Negative for rash and wound.   Neurological: Negative.  Negative for dizziness, headaches and waking up with a headache.   Respiratory: Negative.  Negative for cough, shortness of breath and wheezing.        I have reviewed the review of system and advised patient to contact primary care physician or specialist for non sleep related issues.    ROS  has been reviewed and updated with the patient during this visit.    PHYSICAL EXAMINATION:  BP 134/82   Pulse 90   Ht 6' (1.829 m)   Wt (!) 257 lb (116.6 kg)   SpO2 96%   BMI 34.86 kg/m   GENERAL:  Well developed well-nourished male +obese Body mass index is 34.86 kg/m. in NAD and good historian.  SKIN: No obvious rashes. Warm and dry.  HEAD: Normocephalic and atraumatic. +Facial hair.   EYES:    Pupils equal, reactive and conjugate                 Sclera are nonicteric  Ears:  Ears appear normal externally bilateral   Throat: Thyroid is not palpable           Cervical Lymphadenopathy not present            Mallampati-IV. Oropharynx is moist and pink.   Lungs:  Clear, without wheezes rales or  rhonchi. No stridor. No respiratory distress. Chest wall normal  CV:  Normal S1 S2, Regular rate and rhythm. No murmurs, rubs or gallops.    MS:  Without cyanosis or clubbing. No peripheral edema   NEURO:  Alert and oriented; Normal gait   PSYCH:  Well groomed, calm, pleasant and appropriate. Insight and judgment appear good    LABORATORY TESTS:    Lab Results   Component Value Date    IRON 16 (L) 05/24/2019    TIBC 390 05/24/2019     Lab Results  Component Value Date    TSH 3.63 11/09/2019    FREET4 1.21 11/09/2019     Lab Results   Component Value Date    WBC 11.2 (H) 10/25/2021    HGB 11.9 (L) 10/25/2021    HCT 36.9 (L) 10/25/2021    MCV 83.8 10/25/2021    PLT 156 10/25/2021     No components found for: HBA1C  Lab Results   Component Value Date    GLUCOSE 238 (H) 10/25/2021    BUN 17 10/25/2021    CO2 28 10/25/2021    K 4.2 10/25/2021    NA 135 10/25/2021    CL 99 10/25/2021    CALCIUM 9.0 10/25/2021     No components found for: LASTABGRESULT     Lab results reviewed.  Patient will follow-up with ordering provider for abnormal labs.     Deborah Chalk                        Department of Cardiology*                          [address]                         [place], [place] [address]                              [phone]     Transthoracic Echocardiography     Patient:    Sinan, Tuch   MR #:       [id number]   Account:   Study Date: 11/11/2019   Gender:     M   Age:        64   DOB:        Dec 02, 1940   [facility]      REFERRING    Derrell Lolling,    PERFORMING   [facility]    SONOGRAPHER  Otelia Limes RDCS, RVT    ORDERING     Derrell Lolling, Marissa Caroline    REFERRING    [place], [place] [facility]   Janalyn Shy, Simmie Davies    ATTENDING    Derrell Lolling, Marissa Rayfield Citizen    ADMITTING    Truett Mainland     -------------------------------------------------------------------     Procedure:ECHO 2D COMPLETE W/CONTRAST (TTE)            Order: Accession  Number:[id number]     -------------------------------------------------------------------   Indications:    Shortness of breath R06.02.     -------------------------------------------------------------------   PMH:  Chest pain.  Atrial fibrillation.  Congestive heart failure.    Risk factors:  Hypertension.     -------------------------------------------------------------------   Study data:  Height: 72in. 182.9cm. Weight: 254.5lb. 115.7kg.   Study status:  Routine.  Procedure:  Transthoracic   echocardiography. Image quality was good. Scanning was performed   from the parasternal, apical, and subcostal acoustic windows.   Intravenous contrast (Optison) 6 ml was administered.   Transthoracic echocardiography.  M-mode, complete 2D, complete   spectral Doppler, and color Doppler.  Birthdate:  Patient   birthdate: May 20, 1940.  Age:  Patient is 82yr old.  Sex:  Birth   gender: male.  Body mass index:  BMI: 34.6kg/m^2.  Body surface  area:    BSA: 2.63m^2.  Blood pressure:     152/80  Patient status:    Inpatient.  Study date:  Study date: 11/11/2019. Study time: 10:54   AM.  Location:  Echo laboratory.     -------------------------------------------------------------------   Study Conclusions     - Left ventricle: The cavity size is normal. Wall thickness was increased increased in a pattern of     mild to moderate LVH. Systolic function was normal. The estimated ejection fraction was in the     range of 55% to 60%. Wall motion was normal; there were no regional wall motion abnormalities.     Abnormal relaxation with increased filling pressures.   - Right ventricle: Systolic function was normal. TAPSE: 1.8cm.Tricuspid annular systolic velocity:     9.5cm/s.     Last echo results reviewed.    EPWORTH SLEEPINESS SCALE: (at or above 10 is abnormal and 24 is the maximum score)      03/16/2017     5:00 PM 06/19/2017     4:00 PM 04/23/2018     3:00 PM 04/25/2019     3:40 PM 06/02/2020    10:53 AM 06/30/2021    11:02 AM  08/02/2022     2:33 PM   Epworth Sleepiness Scale   Sitting and reading 0 1 1 0 1 1 2    Watching TV 1 1 1  0 1 2 2    Sitting, inactive in a public place (e.g. a theatre or a meeting) 1 1 0 0 1 0 0   As a passenger in a car for an hour without a break 0 0 0 0 0 1 0   Lying down to rest in the afternoon when circumstances permit 1 1 1 3 1 2 2    Sitting and talking to someone 0 0 0 0 1 0 0   Sitting quietly after a lunch without alcohol 0 0 0 0 1 0 1   In a car, while stopped for a few minutes in traffic 0 0 0 0 0 0 0   Total score 3 4 3 3 6 6 7        Assessment    Assessment/plan:  1. 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  Compliance is adequate for apnea control.  Machine data reviewed and discussed with patient.  His nightly average use time is 7.5 hours.  The AHI is well within normal range with his current pressure settings.  No complaints.    + Continues to have positive benefits with treatment.    Disposable equipment order updated.  He will contact the supply company as needed.    Reiterated the importance of continued Pap treatment.  He agrees to continue therapy.    He is using a commercial cleaning device.  We reviewed risks associated with these devices.  Handwashing cleaning instructions provided on discharge summary.    2. Obesity  Body mass index is 34.86 kg/m.  Stable weight over the last year.    Exercise as tolerated encouraged.    History of diabetes mellitus, hearing loss, hypertension, atrial fibrillation, cataracts, dilated cardiomyopathy, hypercholesterolemia and thyroid disease.    Follow-up appointment:Return visit in 12 months.         A copy of my Plan and Assessment will be sent to: Marcello Fennel    Medical Decision Making:  The following items were considered in medical decision making:  Review /  order clinical lab tests  Review / order other diagnostic tests/interventions  Reviewed outside records    Electronically signed by Garret Reddish Sherlon Nied 08/02/2022 2:48 PM    Body mass index is 34.86 kg/m.  BP Readings from Last 3 Encounters:   08/02/22 134/82   10/26/21 158/83   06/30/21 136/82       SLEEP QUALITY MEASURES:   Obstructive Sleep Apnea Follow Up Visit:   Adherence to therapy documented at least annually?: Yes    Reassessment of excessive daytime sleepiness?: Yes    Weight / body mass index assessment at visit?: Yes    If body mass index greater than 25 was discussion of weight management discussed? : Yes    Blood pressure assessed at visit? : Yes          *This note was transcribed with computerized voice recognition technology.  This note has been reviewed for accuracy however inadvertent transcription errors may be present.    This note was completely edited, written and reviewed by me and consists of information cut and pasted from the my most recent visit, my smart phrases and other Epic tools. I have personally reviewed all aspects of this note to at least include reviewing this patient's chart and problem list, updating the history, physical exam, lab and procedure results, and assessment and plan as detailed above and below.  As such this visit note reflects my current evaluation and management for this patient.

## 2022-08-02 NOTE — Patient Instructions (Signed)
CLEANING INSTRUCTIONS FOR PAP EQUIPMENT     Keeping your equipment and supplies clean is very important.     REMINDER: Only use DISTILLED WATER in your humidifier, Empty water daily.    DAILY  Mask and tubing:   Wash your face before applying mask  Wash mask and tubing with baby shampoo and warm water.     Humidifier:   Empty water in reservoir  Clean with baby shampoo and warm water  Rinse, then air dry    WEEKLY  Mask and tubing:   Soak your mask and tubing in 1 part vinegar and 3 parts water for 30 minutes. Rinse, and allow to air dry.     Humidifier:   Wash with warm water and baby shampoo  Soak in 1 part vinegar and 3 parts water for 30 minutes  Rinse with warm water and allow to air dry    Machine Exterior:   Wipe with a clean damp cloth    MONTHLY AND/OR AS NEEDED  Reusable foam filters (black filter)- wash in warm water with baby shampoo. Rinse well and dry with paper towel  Disposable felt filter (white filter)- Replace filter every two weeks to once a month    NOTE: If you are having repeated sinus and /or respiratory infections, dirty equipment may be the cause. It may help to clean and disinfect your equipment more frequently    TRAVELING  Always make sure the humidifier is empty when traveling. (including doctor appointments, air travel or long distance driving).  When flying, always carry your PAP device with you as a carry on item, NEVER check it in as baggage. We can provide you with a letter stating it is a medical device, talk to your provider.    Always make sure you have your mask and tubing with you. You will need appropriate plug adapters when traveling outside the [place].    If travelling by air and you are unable to carry distilled water with youuse bottled water (no more than 2 weeks). DO NOT USE TAP WATER.

## 2022-08-04 NOTE — Progress Notes (Signed)
Request for Renewal Pap Supply order dated 08/02/2022 to be pulled sent via community messenger to DME co [facility].

## 2022-08-11 NOTE — Telephone Encounter (Signed)
POPULATION HEALTH CLINICAL PHARMACY: ADHERENCE REVIEW  Identified care gap per [employer]: fills at [facility]: ACE/ARB adherence    Patient also appears to be prescribed: ACE/ARB    ASSESSMENT    ACE/ARB ADHERENCE    Insurance Records claims through 08/08/22 (Prior Year PDC = not reported; YTD PDC = FIRST FILL; Potential Fail Date: 09/28/22):   LOSARTAN/HCT TAB 100-25 last filled on 04/20/22 for 90 day supply. Next refill due: 07/19/22    Prescribed sig: 1 tablet/capsule daily    Per Reconcile Dispense History: last filled on 04/20/22 for 90 day supply.     No outreach to the pharmacy     BP Readings from Last 3 Encounters:   04/20/22 122/80   10/25/21 136/82   04/27/21 (!) 140/90     Estimated Creatinine Clearance: 69 mL/min (based on SCr of 1.1 mg/dL).  Lab Results   Component Value Date    CREATININE 1.1 04/20/2022     Lab Results   Component Value Date    K 5.1 04/20/2022       PLAN  The following are interventions that have been identified:   Patient OVERDUE refilling LOSARTAN/HCT TAB 100-25 and active on home medication list.     Attempting to reach patient to review.  Left message asking for return call. MyChart message sent to patient.    Recent Visits  Date Type Provider Dept   04/20/22 Office Visit Marcello Fennel, MD [facility]   10/25/21 Office Visit Marcello Fennel, MD [facility]   04/27/21 Office Visit Marcello Fennel, MD [facility]   02/22/21 Office Visit Marcello Fennel, MD [facility]   Showing recent visits within past 540 days with a meds authorizing provider and meeting all other requirements  Future Appointments  No visits were found meeting these conditions.  Showing future appointments within next 150 days with a meds authorizing provider and meeting all other requirements    No future appointments.    [facility]  // Department, toll free [phone], Option 2    For Pharmacy Admin Tracking Only    Program: Systems analyst in place:  No  Gap Closed?: No   Time Spent (min): 5

## 2022-08-15 NOTE — Telephone Encounter (Signed)
Kavish returned an adherence call concerning LOSARTAN/HCT TAB 100-25 MG.    Pt states that he is taking LOSARTAN/HCT TAB 100-25 MG, 1 tablet daily.     He has about 20 tablets remaining, but would like to go back on valsartan-hydrochlorothiazide (DIOVAN HCT). It appears that he doesn't have an upcoming appointment, pt confirmed that. I suggested that he refill LOSARTAN/HCT TAB 100-25 MG, and call his provider to make an appointment to discuss switching back to Valsartan/HCTZ. I also informed him to continue taking LOSARTAN/HCT until he talks to his provider. He voiced understanding and is going to refill LOSARTAN/HCT TAB 100-25 MG.    Earmon Phoenix, CPhT  Population Health Support Specialist   [facility]   [phone] option 2     For Pharmacy Admin Tracking Only    Program: [facility]  CPA in place:  No  Recommendation Provided To: Patient/Caregiver: 1 via Telephone  Intervention Detail: Adherence Monitoring: 1  Intervention Accepted By: Patient/Caregiver: 1  Gap Closed?: Yes   Time Spent (min): 10

## 2022-08-16 MED ORDER — OMEPRAZOLE 40 MG PO CPDR
40 | ORAL_CAPSULE | ORAL | 0 refills | Status: DC
Start: 2022-08-16 — End: 2022-08-16

## 2022-08-16 MED ORDER — OMEPRAZOLE 40 MG PO CPDR
40 MG | ORAL_CAPSULE | Freq: Every day | ORAL | 0 refills | Status: AC
Start: 2022-08-16 — End: 2022-11-14

## 2022-08-16 NOTE — Telephone Encounter (Signed)
Medication:   Requested Prescriptions     Pending Prescriptions Disp Refills    omeprazole (PRILOSEC) 40 MG delayed release capsule [Pharmacy Med Name: OMEPRAZOLE 40MG  CPDR] 90 capsule 0     Sig: To be filled by Provider     Last Filled:  3.13.23    Last appt: 04/20/2022   Next appt: Visit date not found    Last OARRS:       03/24/2021    12:59 PM   RX Monitoring   Periodic Controlled Substance Monitoring Possible medication side effects, risk of tolerance/dependence & alternative treatments discussed.;No signs of potential drug abuse or diversion identified.

## 2022-08-16 NOTE — Telephone Encounter (Signed)
Medication:   Requested Prescriptions     Pending Prescriptions Disp Refills    omeprazole (PRILOSEC) 40 MG delayed release capsule [Pharmacy Med Name: OMEPRAZOLE 40MG  CPDR]       Last Filled:  5.7.24     Pharmacy comment: NEED SIG.       Last appt: 04/20/2022   Next appt: Visit date not found    Last OARRS:       03/24/2021    12:59 PM   RX Monitoring   Periodic Controlled Substance Monitoring Possible medication side effects, risk of tolerance/dependence & alternative treatments discussed.;No signs of potential drug abuse or diversion identified.

## 2022-09-08 MED ORDER — ATORVASTATIN CALCIUM 10 MG PO TABS
10 MG | ORAL_TABLET | Freq: Every day | ORAL | 1 refills | Status: DC
Start: 2022-09-08 — End: 2023-02-27

## 2022-09-08 NOTE — Telephone Encounter (Signed)
Medication:   Requested Prescriptions     Pending Prescriptions Disp Refills    atorvastatin (LIPITOR) 10 MG tablet [Pharmacy Med Name: ATORVASTATIN CALCIUM 10MG  TABS] 90 tablet 1     Sig: TAKE ONE TABLET BY MOUTH EVERY DAY     Last Filled:  04/20/2022    Last appt: 04/20/2022   Next appt: Visit date not found    Last Lipid:   Lab Results   Component Value Date/Time    CHOL 129 04/20/2022 09:21 AM    TRIG 90 04/20/2022 09:21 AM    HDL 31 04/20/2022 09:21 AM    HDL 34 05/04/2010 12:30 PM

## 2022-09-14 NOTE — Telephone Encounter (Signed)
POPULATION HEALTH CLINICAL PHARMACY: ADHERENCE REVIEW  Identified care gap per [employer]: fills at [facility]: ACE/ARB adherence    Patient also appears to be prescribed: ACE/ARB    ASSESSMENT    ACE/ARB ADHERENCE    Insurance Records claims through 09/07/22 (Prior Year PDC = not reported; YTD PDC = FIRST FILL; Potential Fail Date: 09/28/22):   LOSARTAN/HCT TAB 100-25 last filled on 04/20/22 for 90 day supply. Next refill due: 07/19/22    Prescribed sig: 1 tablet/capsule daily    Per Reconcile Dispense History: last filled on 04/20/22 for 90 day supply.     No outreach to the pharmacy     BP Readings from Last 3 Encounters:   04/20/22 122/80   10/25/21 136/82   04/27/21 (!) 140/90     Estimated Creatinine Clearance: 69 mL/min (based on SCr of 1.1 mg/dL).  Lab Results   Component Value Date    CREATININE 1.1 04/20/2022     Lab Results   Component Value Date    K 5.1 04/20/2022       PLAN  The following are interventions that have been identified:   Patient OVERDUE refilling LOSARTAN/HCT TAB 100-25 and active on home medication list.     Attempting to reach patient to review.  Left message asking for return call.  Called pt to remind him to request a refill for Losartan/HCTZ and or contact his doctor to request Valsartan/HCTZ, as previously stated.    Recent Visits  Date Type Provider Dept   04/20/22 Office Visit Marcello Fennel, MD [facility]   10/25/21 Office Visit Marcello Fennel, MD [facility]   04/27/21 Office Visit Marcello Fennel, MD [facility]   Showing recent visits within past 540 days with a meds authorizing provider and meeting all other requirements  Future Appointments  No visits were found meeting these conditions.  Showing future appointments within next 150 days with a meds authorizing provider and meeting all other requirements    No future appointments.    [facility]  [facility] // Department, toll free [phone], Option 3    For  Pharmacy Admin Tracking Only    Program: [facility]  CPA in place:  No  Gap Closed?: No   Time Spent (min): 10

## 2022-10-10 MED ORDER — METFORMIN HCL 500 MG PO TABS
500 MG | ORAL_TABLET | ORAL | 1 refills | Status: AC
Start: 2022-10-10 — End: 2022-10-17

## 2022-10-10 MED ORDER — CARVEDILOL 25 MG PO TABS
25 MG | ORAL_TABLET | ORAL | 1 refills | Status: AC
Start: 2022-10-10 — End: 2023-04-04

## 2022-10-10 NOTE — Telephone Encounter (Signed)
Medication:   Requested Prescriptions     Pending Prescriptions Disp Refills    carvedilol (COREG) 25 MG tablet 180 tablet 1     Sig: To be filled by Provider     Last Filled:  07/20/2022    Last appt: 04/20/2022   Next appt: 10/17/2022    Last OARRS:       03/24/2021    12:59 PM   RX Monitoring   Periodic Controlled Substance Monitoring Possible medication side effects, risk of tolerance/dependence & alternative treatments discussed.;No signs of potential drug abuse or diversion identified.

## 2022-10-10 NOTE — Telephone Encounter (Signed)
Informed pharmacy.

## 2022-10-10 NOTE — Telephone Encounter (Signed)
[facility] called asking for clarification on pt's prescription. Prescription is missing directions. Please review and resend prescription.    carvedilol (COREG) 25 MG tablet [[id number]]     [facility] [id number] [address] [place], [place] - [address] - [phone] - F [phone]  [address], [place] [address]  Phone: [phone]  Fax: [phone]

## 2022-10-10 NOTE — Telephone Encounter (Signed)
Medication:   Requested Prescriptions     Pending Prescriptions Disp Refills    metFORMIN (GLUCOPHAGE) 500 MG tablet [Pharmacy Med Name: METFORMIN HCL 500MG  TABS] 360 tablet 1     Sig: TAKE TWO TABLETS BY MOUTH TWO TIMES A DAY WITH MEALS     Last Filled:  06/21/2022    Last appt: 04/20/2022   Next appt: 10/17/2022    Last Labs DM:   Lab Results   Component Value Date/Time    LABA1C 7.0 04/20/2022 09:21 AM

## 2022-10-10 NOTE — Telephone Encounter (Signed)
Please provide directions for pts medication so we can tell the pharmacy

## 2022-10-10 NOTE — Telephone Encounter (Signed)
Its is 1 tab twice daily.

## 2022-10-17 ENCOUNTER — Ambulatory Visit: Admit: 2022-10-17 | Discharge: 2022-10-17 | Payer: MEDICARE | Attending: Internal Medicine | Primary: Internal Medicine

## 2022-10-17 VITALS — BP 126/72 | HR 80 | Ht 68.0 in | Wt 251.0 lb

## 2022-10-17 DIAGNOSIS — E119 Type 2 diabetes mellitus without complications: Principal | ICD-10-CM

## 2022-10-17 LAB — POCT GLYCOSYLATED HEMOGLOBIN (HGB A1C): Hemoglobin A1C: 8.2 %

## 2022-10-17 MED ORDER — LOSARTAN POTASSIUM-HCTZ 100-25 MG PO TABS
100-25 MG | ORAL_TABLET | Freq: Every day | ORAL | 1 refills | Status: DC
Start: 2022-10-17 — End: 2023-04-06

## 2022-10-17 MED ORDER — PIOGLITAZONE HCL 30 MG PO TABS
30 MG | ORAL_TABLET | Freq: Every evening | ORAL | 3 refills | Status: AC
Start: 2022-10-17 — End: ?

## 2022-10-17 MED ORDER — APIXABAN 5 MG PO TABS
5 MG | ORAL_TABLET | ORAL | 1 refills | Status: AC
Start: 2022-10-17 — End: 2022-11-21

## 2022-10-17 NOTE — Patient Instructions (Signed)
Preventing Falls: Care Instructions  Injuries and health problems such as trouble walking or poor eyesight can increase your risk of falling. So can some medicines. But there are things you can do to help prevent falls. You can exercise to get stronger. You can also arrange your home to make it safer.    Talk to your doctor about the medicines you take. Ask if any of them increase the risk of falls and whether they can be changed or stopped.   Try to exercise regularly. It can help improve your strength and balance. This can help lower your risk of falling.         Practice fall safety and prevention.   Wear low-heeled shoes that fit well and give your feet good support. Talk to your doctor if you have foot problems that make this hard.  Carry a cellphone or wear a medical alert device that you can use to call for help.  Use stepladders instead of chairs to reach high objects. Don't climb if you're at risk for falls. Ask for help, if needed.  Wear the correct eyeglasses, if you need them.        Make your home safer.   Remove rugs, cords, clutter, and furniture from walkways.  Keep your house well lit. Use night-lights in hallways and bathrooms.  Install and use sturdy handrails on stairways.  Wear nonskid footwear, even inside. Don't walk barefoot or in socks without shoes.        Be safe outside.   Use handrails, curb cuts, and ramps whenever possible.  Keep your hands free by using a shoulder bag or backpack.  Try to walk in well-lit areas. Watch out for uneven ground, changes in pavement, and debris.  Be careful in the winter. Walk on the grass or gravel when sidewalks are slippery. Use de-icer on steps and walkways. Add non-slip devices to shoes.    Put grab bars and nonskid mats in your shower or tub and near the toilet. Try to use a shower chair or bath bench when bathing.   Get into a tub or shower by putting in your weaker leg first. Get out with your strong side first. Have a phone or medical alert  device in the bathroom with you.   Where can you learn more?  Go to [employer] and enter G117 to learn more about "Preventing Falls: Care Instructions."  Current as of: October 25, 2021  Content Version: 14.1   2006-2024 [employer].   Care instructions adapted under license by [facility]. If you have questions about a medical condition or this instruction, always ask your healthcare professional. [employer] disclaims any warranty or liability for your use of this information.           Learning About Being Active as an Older Adult  Why is being active important as you get older?     Being active is one of the best things you can do for your health. And it's never too late to start. Being active--or getting active, if you aren't already--has definite benefits. It can:  Give you more energy,  Keep your mind sharp.  Improve balance to reduce your risk of falls.  Help you manage chronic illness with fewer medicines.  No matter how old you are, how fit you are, or what health problems you have, there is a form of activity that will work for you. And the more physical activity you can do, the better your overall  health will be.  What kinds of activity can help you stay healthy?  Being more active will make your daily activities easier. Physical activity includes planned exercise and things you do in daily life. There are four types of activity:  Aerobic.  Doing aerobic activity makes your heart and lungs strong.  Includes walking, dancing, and gardening.  Aim for at least 2 hours spread throughout the week.  It improves your energy and can help you sleep better.  Muscle-strengthening.  This type of activity can help maintain muscle and strengthen bones.  Includes climbing stairs, using resistance bands, and lifting or carrying heavy loads.  Aim for at least twice a week.  It can help protect the knees and other joints.  Stretching.  Stretching gives you better range  of motion in joints and muscles.  Includes upper arm stretches, calf stretches, and gentle yoga.  Aim for at least twice a week, preferably after your muscles are warmed up from other activities.  It can help you function better in daily life.  Balancing.  This helps you stay coordinated and have good posture.  Includes heel-to-toe walking, tai chi, and certain types of yoga.  Aim for at least 3 days a week.  It can reduce your risk of falling.  Even if you have a hard time meeting the recommendations, it's better to be more active than less active. All activity done in each category counts toward your weekly total. You'd be surprised how daily things like carrying groceries, keeping up with grandchildren, and taking the stairs can add up.  What keeps you from being active?  If you've had a hard time being more active, you're not alone. Maybe you remember being able to do more. Or maybe you've never thought of yourself as being active. It's frustrating when you can't do the things you want. Being more active can help. What's holding you back?  Getting started.  Have a goal, but break it into easy tasks. Small steps build into big accomplishments.  Staying motivated.  If you feel like skipping your activity, remember your goal. Maybe you want to move better and stay independent. Every activity gets you one step closer.  Not feeling your best.  Start with 5 minutes of an activity you enjoy. Prove to yourself you can do it. As you get comfortable, increase your time.  You may not be where you want to be. But you're in the process of getting there. Everyone starts somewhere.  How can you find safe ways to stay active?  Talk with your doctor about any physical challenges you're facing. Make a plan with your doctor if you have a health problem or aren't sure how to get started with activity.  If you're already active, ask your doctor if there is anything you should change to stay safe as your body and health change.  If  you tend to feel dizzy after you take medicine, avoid activity at that time. Try being active before you take your medicine. This will reduce your risk of falls.  If you plan to be active at home, make sure to clear your space before you get started. Remove things like TV cords, coffee tables, and throw rugs. It's safest to have plenty of space to move freely.  The key to getting more active is to take it slow and steady. Try to improve only a little bit at a time. Pick just one area to improve on at first. And if an activity  hurts, stop and talk to your doctor.  Where can you learn more?  Go to [employer] and enter P600 to learn more about "Learning About Being Active as an Older Adult."  Current as of: September 13, 2021  Content Version: 14.1   2006-2024 [employer].   Care instructions adapted under license by [facility]. If you have questions about a medical condition or this instruction, always ask your healthcare professional. [employer] disclaims any warranty or liability for your use of this information.           Eating Healthy Foods: Care Instructions  With every meal, you can make healthy food choices. Try to eat a variety of fruits, vegetables, whole grains, lean proteins, and low-fat dairy products. This can help you get the right balance of nutrients, including vitamins and minerals. Small changes add up over time. You can start by adding one healthy food to your meals each day.    Try to make half your plate fruits and vegetables, one-fourth whole grains, and one-fourth lean proteins. Try including dairy with your meals.   Eat more fruits and vegetables. Try to have them with most meals and snacks.   Foods for healthy eating        Fruits   These can be fresh, frozen, canned, or dried.  Try to choose whole fruit rather than fruit juice.  Eat a variety of colors.        Vegetables   These can be fresh, frozen, canned, or dried.  Beans, peas, and  lentils count too.        Whole grains   Choose whole-grain breads, cereals, and noodles.  Try brown rice.        Lean proteins   These can include lean meat, poultry, fish, and eggs.  You can also have tofu, beans, peas, lentils, nuts, and seeds.        Dairy   Try milk, yogurt, and cheese.  Choose low-fat or fat-free when you can.  If you need to, use lactose-free milk or fortified plant-based milk products, such as soy milk.        Water   Drink water when you're thirsty.  Limit sugar-sweetened drinks, including soda, fruit drinks, and sports drinks.  Where can you learn more?  Go to [employer] and enter T756 to learn more about "Eating Healthy Foods: Care Instructions."  Current as of: December 29, 2021  Content Version: 14.1   2006-2024 [employer].   Care instructions adapted under license by [facility]. If you have questions about a medical condition or this instruction, always ask your healthcare professional. [employer] disclaims any warranty or liability for your use of this information.           Starting a Weight Loss Plan: Care Instructions  Overview     It can be a challenge to lose weight. But your doctor can help you make a weight-loss plan that meets your needs.  You don't have to make a lot of big changes at once. A better idea might be to focus on small changes and stick with them. When those changes become habit, you can add a few more changes.  Some people find it helpful to take an exercise or nutrition class. If you have questions, ask your doctor about seeing a registered dietitian or an exercise specialist. You might also think about joining a weight-loss support group.  If you're not ready to make changes right now, try to pick  a date in the future. Then make an appointment with your doctor to talk about when and how you'll get started with a plan.  Follow-up care is a key part of your treatment and safety. Be sure to make and go to  all appointments, and call your doctor if you are having problems. It's also a good idea to know your test results and keep a list of the medicines you take.  How can you care for yourself at home?  Set realistic goals. Many people expect to lose much more weight than is likely. A weight loss of 5% to 10% of your body weight may be enough to improve your health.  Get family and friends involved to provide support. Talk to them about why you are trying to lose weight, and ask them to help. They can help by participating in exercise and having meals with you, even if they may be eating something different.  Find what works best for you. If you do not have time or do not like to cook, a program that offers meal replacement bars or shakes may be better for you. Or if you like to prepare meals, finding a plan that includes daily menus and recipes may be best.  Ask your doctor about other health professionals who can help you achieve your weight loss goals.  A dietitian can help you make healthy changes in your diet.  An exercise specialist or personal trainer can help you develop a safe and effective exercise program.  A counselor or psychiatrist can help you cope with issues such as depression, anxiety, or family problems that can make it hard to focus on weight loss.  Consider joining a support group for people who are trying to lose weight. Your doctor can suggest groups in your area.  Where can you learn more?  Go to [employer] and enter U357 to learn more about "Starting a Weight Loss Plan: Care Instructions."  Current as of: December 29, 2021  Content Version: 14.1   2006-2024 [employer].   Care instructions adapted under license by [facility]. If you have questions about a medical condition or this instruction, always ask your healthcare professional. [employer] disclaims any warranty or liability for your use of this information.           A Healthy Heart:  Care Instructions  Overview     Coronary artery disease, also called heart disease, occurs when a substance called plaque builds up in the vessels that supply oxygen-rich blood to your heart muscle. This can narrow the blood vessels and reduce blood flow. A heart attack happens when blood flow is completely blocked. A high-fat diet, smoking, and other factors increase the risk of heart disease.  Your doctor has found that you have a chance of having heart disease. A heart-healthy lifestyle can help keep your heart healthy and prevent heart disease. This lifestyle includes eating healthy, being active, staying at a weight that's healthy for you, and not smoking or using tobacco. It also includes taking medicines as directed, managing other health conditions, and trying to get a healthy amount of sleep.  Follow-up care is a key part of your treatment and safety. Be sure to make and go to all appointments, and call your doctor if you are having problems. It's also a good idea to know your test results and keep a list of the medicines you take.  How can you care for yourself at home?  Diet   Use  less salt when you cook and eat. This helps lower your blood pressure. Taste food before salting. Add only a little salt when you think you need it. With time, your taste buds will adjust to less salt.    Eat fewer snack items, fast foods, canned soups, and other high-salt, high-fat, processed foods.    Read food labels and try to avoid saturated and trans fats. They increase your risk of heart disease by raising cholesterol levels.    Limit the amount of solid fat--butter, margarine, and shortening--you eat. Use olive, peanut, or canola oil when you cook. Bake, broil, and steam foods instead of frying them.    Eat a variety of fruit and vegetables every day. Dark green, deep orange, red, or yellow fruits and vegetables are especially good for you. Examples include spinach, carrots, peaches, and berries.    Foods high in  fiber can reduce your cholesterol and provide important vitamins and minerals. High-fiber foods include whole-grain cereals and breads, oatmeal, beans, brown rice, citrus fruits, and apples.    Eat lean proteins. Heart-healthy proteins include seafood, lean meats and poultry, eggs, beans, peas, nuts, seeds, and soy products.    Limit drinks and foods with added sugar. These include candy, desserts, and soda pop.   Heart-healthy lifestyle   If your doctor recommends it, get more exercise. For many people, walking is a good choice. Or you may want to swim, bike, or do other activities. Bit by bit, increase the time you're active every day. Try for at least 30 minutes on most days of the week.    Try to quit or cut back on using tobacco and other nicotine products. This includes smoking and vaping. If you need help quitting, talk to your doctor about stop-smoking programs and medicines. These can increase your chances of quitting for good. Quitting is one of the most important things you can do to protect your heart. It is never too late to quit. Try to avoid secondhand smoke too.    Stay at a weight that's healthy for you. Talk to your doctor if you need help losing weight.    Try to get 7 to 9 hours of sleep each night.    Limit alcohol to 2 drinks a day for men and 1 drink a day for women. Too much alcohol can cause health problems.    Manage other health problems such as diabetes, high blood pressure, and high cholesterol. If you think you may have a problem with alcohol or drug use, talk to your doctor.   Medicines   Take your medicines exactly as prescribed. Call your doctor if you think you are having a problem with your medicine.    If your doctor recommends aspirin, take the amount directed each day. Make sure you take aspirin and not another kind of pain reliever, such as acetaminophen (Tylenol).   When should you call for help?   Call 911 if you have symptoms of a heart attack. These may  include:   Chest pain or pressure, or a strange feeling in the chest.    Sweating.    Shortness of breath.    Pain, pressure, or a strange feeling in the back, neck, jaw, or upper belly or in one or both shoulders or arms.    Lightheadedness or sudden weakness.    A fast or irregular heartbeat.   After you call 911, the operator may tell you to chew 1 adult-strength or 2 to 4 low-dose  aspirin. Wait for an ambulance. Do not try to drive yourself.  Watch closely for changes in your health, and be sure to contact your doctor if you have any problems.  Where can you learn more?  Go to [employer] and enter F075 to learn more about "A Healthy Heart: Care Instructions."  Current as of: October 02, 2021  Content Version: 14.1   2006-2024 [employer].   Care instructions adapted under license by [facility]. If you have questions about a medical condition or this instruction, always ask your healthcare professional. [employer] disclaims any warranty or liability for your use of this information.      Personalized Preventive Plan for Carl Blair - 10/17/2022  Medicare offers a range of preventive health benefits. Some of the tests and screenings are paid in full while other may be subject to a deductible, co-insurance, and/or copay.    Some of these benefits include a comprehensive review of your medical history including lifestyle, illnesses that may run in your family, and various assessments and screenings as appropriate.    After reviewing your medical record and screening and assessments performed today your provider may have ordered immunizations, labs, imaging, and/or referrals for you.  A list of these orders (if applicable) as well as your Preventive Care list are included within your After Visit Summary for your review.    Other Preventive Recommendations:    A preventive eye exam performed by an eye specialist is recommended every 1-2 years to screen for  glaucoma; cataracts, macular degeneration, and other eye disorders.  A preventive dental visit is recommended every 6 months.  Try to get at least 150 minutes of exercise per week or 10,000 steps per day on a pedometer .  Order or download the FREE "Exercise & Physical Activity: Your Everyday Guide" from [employer]. Call [phone] or search [employer] online.  You need 1200-1500 mg of calcium and 1000-2000 IU of vitamin D per day. It is possible to meet your calcium requirement with diet alone, but a vitamin D supplement is usually necessary to meet this goal.  When exposed to the sun, use a sunscreen that protects against both UVA and UVB radiation with an SPF of 30 or greater. Reapply every 2 to 3 hours or after sweating, drying off with a towel, or swimming.  Always wear a seat belt when traveling in a car. Always wear a helmet when riding a bicycle or motorcycle.

## 2022-10-17 NOTE — Progress Notes (Signed)
Medicare Annual Wellness Visit    Carl Blair is here for Medicare AWV, Diabetes, and Shortness of Breath (Began 3 weeks ago)    Assessment & Plan   Type 2 diabetes mellitus without complication, without long-term current use of insulin (HCC)  -     POCT glycosylated hemoglobin (Hb A1C)    Recommendations for Preventive Services Due: see orders and patient instructions/AVS.  Recommended screening schedule for the next 5-10 years is provided to the patient in written form: see Patient Instructions/AVS.     No follow-ups on file.     Subjective   The following acute and/or chronic problems were also addressed today:    Patient's complete Health Risk Assessment and screening values have been reviewed and are found in Flowsheets. The following problems were reviewed today and where indicated follow up appointments were made and/or referrals ordered.    Positive Risk Factor Screenings with Interventions:    Fall Risk:  Do you feel unsteady or are you worried about falling? : (!) yes (unsteady when walking)  2 or more falls in past year?: no  Fall with injury in past year?: no       Interventions:    Reviewed medications, home hazards, visual acuity, and co-morbidities that can increase risk for falls  Patient declines any further evaluation or treatment             Activity, Diet, and Weight:  On average, how many days per week do you engage in moderate to strenuous exercise (like a brisk walk)?: 0 days  On average, how many minutes do you engage in exercise at this level?: 0 min    Do you eat balanced/healthy meals regularly?: (!) No    Body mass index is 38.16 kg/m. (!) Abnormal        Inactivity Interventions:  Patient advised to follow up in the office for further evaluation and treatment  Do you eat balanced/healthy meals regularly Interventions:  Patient declines any further evaluation or treatment  Obesity Interventions:  Patient declines any further evaluation or treatment                                Objective   Vitals:    10/17/22 1112   BP: 126/72   Pulse: 80   SpO2: 96%   Weight: 113.9 kg (251 lb)   Height: 1.727 m (5\' 8" )      Body mass index is 38.16 kg/m.      General Appearance: alert and oriented to person, place and time, well developed and well- nourished, in no acute distress  Skin: warm and dry, no rash or erythema  Head: normocephalic and atraumatic  Eyes: pupils equal, round, and reactive to light, extraocular eye movements intact, conjunctivae normal  ENT: tympanic membrane, external ear and ear canal normal bilaterally, nose without deformity, nasal mucosa and turbinates normal without polyps  Neck: supple and non-tender without mass, no thyromegaly or thyroid nodules, no cervical lymphadenopathy  Pulmonary/Chest: clear to auscultation bilaterally- no wheezes, rales or rhonchi, normal air movement, no respiratory distress  Cardiovascular: normal rate, regular rhythm, normal S1 and S2, no murmurs, rubs, clicks, or gallops, distal pulses intact, no carotid bruits  Abdomen: soft, non-tender, non-distended, normal bowel sounds, no masses or organomegaly  Extremities: no cyanosis, clubbing or edema  Musculoskeletal: normal range of motion, no joint swelling, deformity or tenderness  Neurologic: reflexes normal and symmetric, no cranial  nerve deficit, gait, coordination and speech normal       No Known Allergies  Prior to Visit Medications    Medication Sig Taking? Authorizing Provider   metFORMIN (GLUCOPHAGE) 500 MG tablet TAKE TWO TABLETS BY MOUTH TWO TIMES A DAY WITH MEALS Yes Felcia Huebert R, MD   carvedilol (COREG) 25 MG tablet To be filled by Provider Yes Sheliah Plane, Artemio Aly, MD   atorvastatin (LIPITOR) 10 MG tablet TAKE ONE TABLET BY MOUTH EVERY DAY Yes Namir Neto, Artemio Aly, MD   omeprazole (PRILOSEC) 40 MG delayed release capsule Take 1 capsule by mouth daily Yes Ruck, Katheryne R, DO   apixaban (ELIQUIS) 5 MG TABS tablet To be filled by Provider Yes Sheliah Plane, Artemio Aly, MD   SITagliptin  (JANUVIA) 100 MG tablet TAKE 1 TABLET DAILY Yes Mireyah Chervenak R, MD   levothyroxine (SYNTHROID) 100 MCG tablet TAKE 1 TABLET DAILY Yes Katheryne Gorr R, MD   losartan-hydroCHLOROthiazide (HYZAAR) 100-25 MG per tablet Take 1 tablet by mouth daily Yes Lorena Benham R, MD   glimepiride (AMARYL) 2 MG tablet Take 1 tablet by mouth 2 times daily (with meals) Yes Itzamara Casas, Artemio Aly, MD   Accu-Chek FastClix Lancets MISC Test once daily and prn dx 250.00 Yes Bernece Gall R, MD   blood glucose test strips (ACCU-CHEK AVIVA PLUS) strip TEST BLOOD SUGAR ONCE DAILY dx E11.9 Yes Kahmya Pinkham R, MD   metFORMIN (GLUCOPHAGE) 1000 MG tablet TAKE 1 TABLET BY MOUTH  TWICE A DAY WITH MEALS Yes Kao Berkheimer R, MD   propafenone (RYTHMOL) 300 MG tablet Take 0.5 tablets by mouth 3 times daily Yes [name], MD   Misc Natural Products (OSTEO BI-FLEX JOINT SHIELD PO) Take by mouth daily Yes [provider]   Blood Glucose Monitoring Suppl DEVI by Does not apply route. Diagnosis   250.00 Yes [name], MD   furosemide (LASIX) 20 MG tablet Take 1 tablet by mouth daily  Patient not taking: Reported on 10/17/2022  Marcello Fennel, MD   aspirin 81 MG EC tablet Take 1 tablet by mouth daily  Patient not taking: Reported on 10/17/2022  Arby Barrette, APRN - CNP       CareTeam (Including outside providers/suppliers regularly involved in providing care):   Patient Care Team:  Marcello Fennel, MD as PCP - General (Internal Medicine)  Marcello Fennel, MD as PCP - Empaneled Provider  Durward Fortes as Social Worker (Licensed Clinical Social Worker)     Reviewed and updated this visit:  Tobacco  Allergies  Meds  Med Hx  Surg Hx  Soc Hx  Fam Hx

## 2022-11-22 MED ORDER — OMEPRAZOLE 40 MG PO CPDR
40 MG | ORAL_CAPSULE | Freq: Every day | ORAL | 0 refills | Status: DC
Start: 2022-11-22 — End: 2023-02-20

## 2022-11-22 MED ORDER — APIXABAN 5 MG PO TABS
5 MG | ORAL_TABLET | Freq: Two times a day (BID) | ORAL | 0 refills | Status: DC
Start: 2022-11-22 — End: 2023-02-20

## 2022-11-22 NOTE — Telephone Encounter (Signed)
Medication:   Requested Prescriptions     Pending Prescriptions Disp Refills    apixaban (ELIQUIS) 5 MG TABS tablet 180 tablet 1     Sig: To be filled by Provider     Last Filled:  10/17/2022    Last appt: 10/17/2022   Next appt: 11/21/2022    Last OARRS:       03/24/2021    12:59 PM   RX Monitoring   Periodic Controlled Substance Monitoring Possible medication side effects, risk of tolerance/dependence & alternative treatments discussed.;No signs of potential drug abuse or diversion identified.

## 2022-11-22 NOTE — Telephone Encounter (Signed)
Medication:   Requested Prescriptions     Pending Prescriptions Disp Refills    omeprazole (PRILOSEC) 40 MG delayed release capsule 90 capsule 0     Sig: Take 1 capsule by mouth daily     Last Filled:  5.7.24    Last appt: 10/17/2022   Next appt: Visit date not found    Last OARRS:       03/24/2021    12:59 PM   RX Monitoring   Periodic Controlled Substance Monitoring Possible medication side effects, risk of tolerance/dependence & alternative treatments discussed.;No signs of potential drug abuse or diversion identified.

## 2022-12-19 MED ORDER — ACCU-CHEK FASTCLIX LANCETS MISC
1 refills | Status: AC
Start: 2022-12-19 — End: ?

## 2022-12-19 MED ORDER — ACCU-CHEK AVIVA PLUS VI STRP
ORAL_STRIP | 5 refills | Status: AC
Start: 2022-12-19 — End: ?

## 2022-12-19 NOTE — Telephone Encounter (Signed)
 Medication:   Requested Prescriptions     Pending Prescriptions Disp Refills    Accu-Chek FastClix Lancets MISC 102 each 1     Sig: Test once daily and prn dx 250.00    blood glucose test strips (ACCU-CHEK AVIVA PLUS) strip 100 strip 5     Sig: TEST BLOOD SUGAR ONCE DAILY dx E11.9     Last Filled:  04/20/2022    Last appt: 10/17/2022   Next appt: 12/19/2022    Last Labs DM:   Lab Results   Component Value Date/Time    LABA1C 8.2 10/17/2022 11:17 AM

## 2022-12-21 MED ORDER — PIOGLITAZONE HCL 30 MG PO TABS
30 | ORAL_TABLET | Freq: Every evening | ORAL | 3 refills | Status: DC
Start: 2022-12-21 — End: 2024-01-29

## 2022-12-21 NOTE — Telephone Encounter (Signed)
 Pt called in for meds refill.Please contact Pt when completed.  pioglitazone (ACTOS) 30 MG tablet [[id number]]   [facility] - [place], [place] - [address] - P [phone] - F [phone]

## 2023-02-16 ENCOUNTER — Encounter

## 2023-02-16 ENCOUNTER — Encounter: Admit: 2023-02-16 | Discharge: 2023-02-16 | Payer: MEDICARE | Attending: Internal Medicine | Primary: Internal Medicine

## 2023-02-16 VITALS — BP 138/88 | HR 81 | Temp 96.80000°F | Resp 16 | Ht 68.0 in | Wt 260.0 lb

## 2023-02-16 DIAGNOSIS — E119 Type 2 diabetes mellitus without complications: Secondary | ICD-10-CM

## 2023-02-16 LAB — CBC WITH AUTO DIFFERENTIAL
Basophils %: 0.5 %
Basophils Absolute: 0.1 10*3/uL (ref 0.0–0.2)
Eosinophils %: 2.4 %
Eosinophils Absolute: 0.2 10*3/uL (ref 0.0–0.6)
Hematocrit: 36.6 % — ABNORMAL LOW (ref 40.5–52.5)
Hemoglobin: 12.2 g/dL — ABNORMAL LOW (ref 13.5–17.5)
Lymphocytes %: 41.3 %
Lymphocytes Absolute: 4.3 10*3/uL (ref 1.0–5.1)
MCH: 28.8 pg (ref 26.0–34.0)
MCHC: 33.3 g/dL (ref 31.0–36.0)
MCV: 86.3 fL (ref 80.0–100.0)
MPV: 7.6 fL (ref 5.0–10.5)
Monocytes %: 8.3 %
Monocytes Absolute: 0.9 10*3/uL (ref 0.0–1.3)
Neutrophils %: 47.5 %
Neutrophils Absolute: 5 10*3/uL (ref 1.7–7.7)
Platelets: 166 10*3/uL (ref 135–450)
RBC: 4.24 M/uL (ref 4.20–5.90)
RDW: 15.9 % — ABNORMAL HIGH (ref 12.4–15.4)
WBC: 10.5 10*3/uL (ref 4.0–11.0)

## 2023-02-16 LAB — LIPID PANEL
Cholesterol, Total: 134 mg/dL (ref 0–199)
HDL: 37 mg/dL — ABNORMAL LOW (ref 40–60)
LDL Cholesterol: 83 mg/dL (ref <100)
Triglycerides: 70 mg/dL (ref 0–150)
VLDL Cholesterol Calculated: 14 mg/dL

## 2023-02-16 LAB — COMPREHENSIVE METABOLIC PANEL
ALT: 14 U/L (ref 10–40)
AST: 18 U/L (ref 15–37)
Albumin/Globulin Ratio: 2.5 — ABNORMAL HIGH (ref 1.1–2.2)
Albumin: 4.2 g/dL (ref 3.4–5.0)
Alkaline Phosphatase: 92 U/L (ref 40–129)
Anion Gap: 11 (ref 3–16)
BUN: 22 mg/dL — ABNORMAL HIGH (ref 7–20)
CO2: 29 mmol/L (ref 21–32)
Calcium: 9.3 mg/dL (ref 8.3–10.6)
Chloride: 99 mmol/L (ref 99–110)
Creatinine: 1 mg/dL (ref 0.8–1.3)
Est, Glom Filt Rate: 75 (ref >60)
Glucose: 133 mg/dL — ABNORMAL HIGH (ref 70–99)
Potassium: 4.5 mmol/L (ref 3.5–5.1)
Sodium: 139 mmol/L (ref 136–145)
Total Bilirubin: 0.5 mg/dL (ref 0.0–1.0)
Total Protein: 5.9 g/dL — ABNORMAL LOW (ref 6.4–8.2)

## 2023-02-16 LAB — MICROALBUMIN / CREATININE URINE RATIO
Creatinine, Ur: 103 mg/dL (ref 39.0–259.0)
Microalb, Ur: 5.32 mg/dL — ABNORMAL HIGH (ref <2.0)
Microalb/Creat Ratio: 51.7 mg/g — ABNORMAL HIGH (ref 0.0–30.0)

## 2023-02-16 LAB — PSA SCREENING: PSA: 0.53 ng/mL (ref 0.00–4.00)

## 2023-02-16 LAB — POCT GLYCOSYLATED HEMOGLOBIN (HGB A1C): Hemoglobin A1C: 6.7 %

## 2023-02-16 LAB — TSH WITH REFLEX: TSH: 3.15 u[IU]/mL (ref 0.27–4.20)

## 2023-02-16 MED ORDER — LINAGLIPTIN 5 MG PO TABS
5 | ORAL_TABLET | Freq: Every day | ORAL | 1 refills | Status: AC
Start: 2023-02-16 — End: ?

## 2023-02-16 NOTE — Progress Notes (Signed)
SUBJECTIVE-  Established patient here for a visit to manage acute and chronic medical conditions as detailed below.    Type 2 diabetes mellitus with hyperglycemia, without long-term current use of insulin (HCC)  Diabetes Mellitus Type II:  Home blood sugar records reviewed: fasting range: 120-150 .  No significant episodes of hypoglycemia. Compliant with medications.  No polyuria, polydipsia, visual changes, foot problems, GI upset.  Diabetic diet compliance:  compliant most of the time.  Current exercise: none. Will check labs, and refill medications as appropriate.     Hypertension:  Denies CP, SOB, visual changes, dizziness, palpitations or HA.  He is adherent to a low sodium diet.  Blood pressure typically runs ok  outside of the office. Continue current medications.     Hyperlipidemia:  No new myalgias or GI upset on current medications.              Lab Results   Component Value Date     LABA1C 7.5 04/27/2021     LABA1C 8.7 (H) 03/28/2021     LABA1C 8.8 01/01/2021            Lab Results   Component Value Date     CREATININE 0.9 04/27/2021            Lab Results   Component Value Date     ALT 9 (L) 04/27/2021     AST 11 (L) 04/27/2021      No components found for: "CHLPL"        Lab Results   Component Value Date     TRIG 100 04/27/2021            Lab Results   Component Value Date     HDL 33 (L) 04/27/2021            Lab Results   Component Value Date/Time     LDLCALC 91 04/27/2021 04:05 PM      This problem was reviewed in detail. It is under control and stable. Will check blood work.        Spinal stenosis of lumbar region with neurogenic claudication  S/p surgery , doing ok.       Morbid obesity due to excess calories [facility])  Patient counseled,   Encouraged to lose weight, watch diet and exercise consistently.        Essential hypertension  This is a chronic problem. The problem is well controlled.  Patient monitors readings regularly. Pertinent negatives include no chest pain, focal sensory loss, focal  weakness, leg pain, myalgias or shortness of breath. No headaches or chest pain. Takes medications regularly.  Blood pressure has been stable, blood work was reviewed, and advised patient to continue the current instructions or medications.        Acquired hypothyroidism  On synthroid,  Patient is compliant w medications, no side effects, effective, provides adequate symptom relief. No new symptoms or problems as noted by patient.  The problem is stable, no changes noted by patient. Will consider monitoring labs and refill medications as appropriate. Patient counseled and will continue current plan.       Pure hypercholesterolemia  This has been a long standing problem, takes lipitor       Monitors diet and tries to follow a low fat diet. Has  been reasonably  compliant w exercise. Lipids have been stable, The problem is controlled. Recent lipid tests were reviewed and are normal. Pertinent negatives include no chest pain, focal sensory loss, focal weakness, leg  pain, myalgias or shortness of breath.  Advised patient to continue the current instructions or medications.         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    Type II or unspecified type diabetes mellitus without mention of complication, not stated as uncontrolled     Unspecified sleep apnea         Current Outpatient Medications   Medication Sig Dispense Refill    pioglitazone (ACTOS) 30 MG tablet Take 1 tablet by mouth at bedtime 90 tablet 3    Accu-Chek FastClix Lancets MISC Test once daily and prn dx 250.00 102 each 1    blood glucose test strips (ACCU-CHEK AVIVA PLUS) strip TEST BLOOD SUGAR ONCE DAILY dx E11.9 100 strip 5    apixaban (ELIQUIS) 5 MG TABS tablet Take 1 tablet by mouth 2 times daily 180 tablet 0    omeprazole (PRILOSEC) 40 MG delayed release capsule Take 1 capsule by  mouth daily 90 capsule 0    losartan-hydroCHLOROthiazide (HYZAAR) 100-25 MG per tablet Take 1 tablet by mouth daily 90 tablet 1    carvedilol (COREG) 25 MG tablet To be filled by Provider 180 tablet 1    atorvastatin (LIPITOR) 10 MG tablet TAKE ONE TABLET BY MOUTH EVERY DAY 90 tablet 1    SITagliptin (JANUVIA) 100 MG tablet TAKE 1 TABLET DAILY 90 tablet 3    levothyroxine (SYNTHROID) 100 MCG tablet TAKE 1 TABLET DAILY 90 tablet 3    glimepiride (AMARYL) 2 MG tablet Take 1 tablet by mouth 2 times daily (with meals) 180 tablet 3    aspirin 81 MG EC tablet Take 1 tablet by mouth daily 30 tablet 0    metFORMIN (GLUCOPHAGE) 1000 MG tablet TAKE 1 TABLET BY MOUTH  TWICE A DAY WITH MEALS 180 tablet 3    propafenone (RYTHMOL) 300 MG tablet Take 0.5 tablets by mouth 3 times daily      Misc Natural Products (OSTEO BI-FLEX JOINT SHIELD PO) Take by mouth daily      Blood Glucose Monitoring Suppl DEVI by Does not apply route. Diagnosis   250.00 1 Device 0    furosemide (LASIX) 20 MG tablet Take 1 tablet by mouth daily (Patient not taking: Reported on 10/17/2022) 30 tablet 5     No current facility-administered medications for this visit.        No Known Allergies       ROS: No unusual headaches or allergy symptoms or blurred vision.  No prolonged cough. No chest pain,dizziness, dyspnea, palpitations, or chest pain on exertion. No nausea or vommitting or diarrhea.  No abdominal pain, change in bowel habits, black or bloody stools.  No urinary tract  symptoms.  No new or unusual musculoskeletal symptoms.  No numbness, weakness, or tingling. No falls, or loss of consciousness. No weight loss or back pain. No paresthesias. No joint swelling or redness. No recent weight loss. No focal weakness or sensory deficits. No hematemesis. No hearing loss. No siezures.     OBJECTIVE-  BP 138/88 (Site: Left Upper Arm, Position: Sitting, Cuff Size: Small Adult)   Pulse 81   Temp 96.8 F (36 C)   Resp 16   Ht 1.727 m (5\' 8" )   Wt 117.9 kg (  260  lb)   SpO2 100%   BMI 39.53 kg/m    The physical exam reveals a patient who appears well, alert and oriented x 3, pleasant, cooperative. Vitals are as noted. Head is atraumatic and normocephalic. Eyes reveal normal conjunctiva, cornea normal, pupils are equal and rective to light. Nasal mucosa is normal. Throat is normal without exudates. Ears reveal normal tympanic membranes.Neck is supple and free of adenopathy, or masses. No thyromegaly. No jugular venous distension. Lungs are clear to auscultation, no rales or rhonchi noted. Heart sounds are regular , no murmurs, clicks, gallops or rubs. Abdomen is soft, no tenderness, masses or organomegaly. Bowel sounds are normally heard.Pelvis: normal. Extremities are normal. Peripheral pulses are normal. Screening neurological exam is normal without focal findings. Cranial nerves are intact, reflexes are symmetrical and muscle strength eaqual. Skin is normal without suspicious lesions noted.         ASSESSMENT / PLAN-  Type 2 diabetes mellitus with hyperglycemia, without long-term current use of insulin (HCC)  Diabetes Mellitus Type II:  Home blood sugar records reviewed: fasting range: 120-150 .  No significant episodes of hypoglycemia. Compliant with medications.  No polyuria, polydipsia, visual changes, foot problems, GI upset.  Diabetic diet compliance:  compliant most of the time.  Current exercise: none. Will check labs, and refill medications as appropriate.     Hypertension:  Denies CP, SOB, visual changes, dizziness, palpitations or HA.  He is adherent to a low sodium diet.  Blood pressure typically runs ok  outside of the office. Continue current medications.     Hyperlipidemia:  No new myalgias or GI upset on current medications.              Lab Results   Component Value Date     LABA1C 7.5 04/27/2021     LABA1C 8.7 (H) 03/28/2021     LABA1C 8.8 01/01/2021            Lab Results   Component Value Date     CREATININE 0.9 04/27/2021            Lab Results    Component Value Date     ALT 9 (L) 04/27/2021     AST 11 (L) 04/27/2021      No components found for: "CHLPL"        Lab Results   Component Value Date     TRIG 100 04/27/2021            Lab Results   Component Value Date     HDL 33 (L) 04/27/2021            Lab Results   Component Value Date/Time     LDLCALC 91 04/27/2021 04:05 PM      This problem was reviewed in detail. It is under control and stable. Will check blood work.        Spinal stenosis of lumbar region with neurogenic claudication  S/p surgery , doing ok.       Morbid obesity due to excess calories [facility])  Patient counseled,   Encouraged to lose weight, watch diet and exercise consistently.        Essential hypertension  This is a chronic problem. The problem is well controlled.  Patient monitors readings regularly. Pertinent negatives include no chest pain, focal sensory loss, focal weakness, leg pain, myalgias or shortness of breath. No headaches or chest pain. Takes medications regularly.  Blood pressure has been stable, blood work was reviewed, and  advised patient to continue the current instructions or medications.        Acquired hypothyroidism  On synthroid,  Patient is compliant w medications, no side effects, effective, provides adequate symptom relief. No new symptoms or problems as noted by patient.  The problem is stable, no changes noted by patient. Will consider monitoring labs and refill medications as appropriate. Patient counseled and will continue current plan.       Pure hypercholesterolemia  This has been a long standing problem, takes lipitor       Monitors diet and tries to follow a low fat diet. Has  been reasonably  compliant w exercise. Lipids have been stable, The problem is controlled. Recent lipid tests were reviewed and are normal. Pertinent negatives include no chest pain, focal sensory loss, focal weakness, leg pain, myalgias or shortness of breath.  Advised patient to continue the current instructions or medications.

## 2023-02-16 NOTE — Addendum Note (Signed)
Addended by: Windell Moulding on: 02/16/2023 09:15 AM     Modules accepted: Orders

## 2023-02-20 MED ORDER — APIXABAN 5 MG PO TABS
5 MG | ORAL_TABLET | Freq: Two times a day (BID) | ORAL | 0 refills | Status: DC
Start: 2023-02-20 — End: 2023-07-25

## 2023-02-20 MED ORDER — OMEPRAZOLE 40 MG PO CPDR
40 MG | ORAL_CAPSULE | Freq: Every day | ORAL | 0 refills | Status: AC
Start: 2023-02-20 — End: 2023-05-21

## 2023-02-20 MED ORDER — METFORMIN HCL 500 MG PO TABS
500 MG | ORAL_TABLET | ORAL | 1 refills | Status: DC
Start: 2023-02-20 — End: 2023-08-11

## 2023-02-20 NOTE — Telephone Encounter (Signed)
Medication:   Requested Prescriptions     Pending Prescriptions Disp Refills    omeprazole (PRILOSEC) 40 MG delayed release capsule 90 capsule 0     Sig: Take 1 capsule by mouth daily     Last Filled:  11/22/2022    Last appt: 02/16/2023   Next appt: 02/20/2023    Last OARRS:       03/24/2021    12:59 PM   RX Monitoring   Periodic Controlled Substance Monitoring Possible medication side effects, risk of tolerance/dependence & alternative treatments discussed.;No signs of potential drug abuse or diversion identified.

## 2023-02-20 NOTE — Telephone Encounter (Signed)
Medication:   Requested Prescriptions     Pending Prescriptions Disp Refills    apixaban (ELIQUIS) 5 MG TABS tablet 180 tablet 0     Sig: Take 1 tablet by mouth 2 times daily     Last Filled:  11/22/2022    Last appt: 02/16/2023   Next appt: Visit date not found    Last OARRS:       03/24/2021    12:59 PM   RX Monitoring   Periodic Controlled Substance Monitoring Possible medication side effects, risk of tolerance/dependence & alternative treatments discussed.;No signs of potential drug abuse or diversion identified.

## 2023-02-20 NOTE — Telephone Encounter (Signed)
Medication:   Requested Prescriptions     Pending Prescriptions Disp Refills    metFORMIN (GLUCOPHAGE) 500 MG tablet [Pharmacy Med Name: METFORMIN HCL 500MG  TABS] 360 tablet 1     Sig: TAKE TWO TABLETS BY MOUTH TWICE A DAY WITH MEALS     Last Filled:  11/05/2018    Last appt: 02/16/2023   Next appt: Visit date not found    Last OARRS:       03/24/2021    12:59 PM   RX Monitoring   Periodic Controlled Substance Monitoring Possible medication side effects, risk of tolerance/dependence & alternative treatments discussed.;No signs of potential drug abuse or diversion identified.

## 2023-02-27 MED ORDER — ATORVASTATIN CALCIUM 10 MG PO TABS
10 | ORAL_TABLET | Freq: Every day | ORAL | 1 refills | 90.00000 days | Status: DC
Start: 2023-02-27 — End: 2023-08-08

## 2023-02-27 NOTE — Telephone Encounter (Signed)
 Medication:   Requested Prescriptions     Pending Prescriptions Disp Refills    atorvastatin (LIPITOR) 10 MG tablet [Pharmacy Med Name: ATORVASTATIN CALCIUM 10MG  TABS] 90 tablet 1     Sig: TAKE ONE TABLET BY MOUTH EVERY DAY     Last Filled:  09/08/2022

## 2023-04-03 MED ORDER — LEVOTHYROXINE SODIUM 100 MCG PO TABS
100 | ORAL_TABLET | ORAL | 3 refills | Status: DC
Start: 2023-04-03 — End: 2024-02-19

## 2023-04-03 NOTE — Telephone Encounter (Signed)
 Medication:   Requested Prescriptions     Pending Prescriptions Disp Refills    levothyroxine (SYNTHROID) 100 MCG tablet [Pharmacy Med Name: SYNTHROID TABS] 90 tablet 3     Sig: TAKE ONE TABLET BY MOUTH EVERY DAY     Last Filled:  04/20/2022    Last

## 2023-04-06 MED ORDER — LOSARTAN POTASSIUM-HCTZ 100-25 MG PO TABS
100-25 | ORAL_TABLET | Freq: Every day | ORAL | 1 refills | Status: DC
Start: 2023-04-06 — End: 2023-10-16

## 2023-04-06 MED ORDER — CARVEDILOL 25 MG PO TABS
25 | ORAL_TABLET | ORAL | 1 refills | Status: DC
Start: 2023-04-06 — End: 2023-04-06

## 2023-04-06 MED ORDER — CARVEDILOL 25 MG PO TABS
25 | ORAL_TABLET | ORAL | 1 refills | 30.00 days | Status: DC
Start: 2023-04-06 — End: 2023-08-28

## 2023-04-06 MED ORDER — OMEPRAZOLE 40 MG PO CPDR
40 | ORAL_CAPSULE | Freq: Every day | ORAL | 0 refills | 60.00000 days | Status: DC
Start: 2023-04-06 — End: 2023-09-15

## 2023-04-06 MED ORDER — LOSARTAN POTASSIUM-HCTZ 100-25 MG PO TABS
100-25 | ORAL_TABLET | Freq: Every day | ORAL | 1 refills | Status: AC
Start: 2023-04-06 — End: ?

## 2023-04-06 NOTE — Telephone Encounter (Signed)
 Medication:   Requested Prescriptions     Pending Prescriptions Disp Refills    carvedilol (COREG) 25 MG tablet 180 tablet 1     Sig: To be filled by Provider     Last Filled:  10/10/2022    Last appt: 02/16/2023   Next appt: 04/05/2023    Last OARRS:

## 2023-04-06 NOTE — Telephone Encounter (Signed)
 Medication:   Requested Prescriptions     Pending Prescriptions Disp Refills    omeprazole (PRILOSEC) 40 MG delayed release capsule [Pharmacy Med Name: OMEPRAZOLE 40MG  CPDR] 90 capsule 0     Sig: TAKE 1 CAPSULE BY MOUTH DAILY     Last Filled:  02/20/2023

## 2023-04-06 NOTE — Telephone Encounter (Signed)
 Medication:   Requested Prescriptions     Pending Prescriptions Disp Refills    losartan-hydroCHLOROthiazide (HYZAAR) 100-25 MG per tablet 90 tablet 1     Sig: Take 1 tablet by mouth daily     Last Filled:  10/17/2022    Last appt: 02/16/2023   Next appt: 1

## 2023-04-06 NOTE — Telephone Encounter (Signed)
 Medication:   Requested Prescriptions     Pending Prescriptions Disp Refills    losartan-hydroCHLOROthiazide (HYZAAR) 100-25 MG per tablet [Pharmacy Med Name: LOSARTAN -HCTZ 100-25 MG TABS] 90 tablet 1     Sig: TAKE ONE TABLET BY MOUTH EVERY DAY     Last F

## 2023-04-06 NOTE — Telephone Encounter (Signed)
 Pharmacy called, verify the directions      carvedilol (COREG) 25 MG tablet     Pharmacy    [facility] [id number] - [place], [place] - [address] - P [phone] - F [phone]  [address], [place] [address]  Phone: [phone]

## 2023-04-19 MED ORDER — GLIMEPIRIDE 2 MG PO TABS
2 | ORAL_TABLET | Freq: Two times a day (BID) | ORAL | 3 refills | Status: DC
Start: 2023-04-19 — End: 2024-03-05

## 2023-04-19 NOTE — Telephone Encounter (Signed)
Medication:   Requested Prescriptions     Pending Prescriptions Disp Refills    glimepiride (AMARYL) 2 MG tablet [Pharmacy Med Name: GLIMEPIRIDE 2MG  TABS] 180 tablet 3     Sig: TAKE ONE TABLET BY MOUTH TWO TIMES A DAY WITH MEALS     Last Filled:  04/20/2022    Last appt: 02/16/2023   Next appt: Visit date not found    Last Labs DM:   Lab Results   Component Value Date/Time    LABA1C 6.7 02/16/2023 09:15 AM

## 2023-07-03 MED ORDER — TRADJENTA 5 MG PO TABS
5 | ORAL_TABLET | Freq: Every day | ORAL | 0 refills | 30.00000 days | Status: DC
Start: 2023-07-03 — End: 2023-09-20

## 2023-07-03 NOTE — Telephone Encounter (Signed)
 Medication:   Requested Prescriptions     Pending Prescriptions Disp Refills    TRADJENTA 5 MG tablet [Pharmacy Med Name: TRADJENTA 5MG  TABS] 90 tablet 1     Sig: TAKE ONE TABLET BY MOUTH EVERY DAY     Last Filled:  02/16/2023    Last appt: 02/16/2023   Next appt: Visit date not found    Last Labs DM:   Lab Results   Component Value Date/Time    LABA1C 6.7 02/16/2023 09:15 AM

## 2023-07-25 MED ORDER — ELIQUIS 5 MG PO TABS
5 | ORAL_TABLET | Freq: Two times a day (BID) | ORAL | 0 refills | Status: DC
Start: 2023-07-25 — End: 2023-12-26

## 2023-07-25 NOTE — Telephone Encounter (Signed)
 Medication:   Requested Prescriptions     Pending Prescriptions Disp Refills    ELIQUIS 5 MG TABS tablet [Pharmacy Med Name: Eliquis Oral Tab 5 Mg] 180 tablet 0     Sig: TAKE ONE TABLET BY MOUTH TWICE A DAY     Last Filled:  02/20/2023    Last appt: 02/16/2023   Next appt: Visit date not found    Last OARRS:       03/24/2021    12:59 PM   RX Monitoring   Periodic Controlled Substance Monitoring Possible medication side effects, risk of tolerance/dependence & alternative treatments discussed.;No signs of potential drug abuse or diversion identified.

## 2023-07-27 ENCOUNTER — Ambulatory Visit
Admit: 2023-07-27 | Discharge: 2023-07-27 | Payer: Medicare (Managed Care) | Attending: Internal Medicine | Primary: Internal Medicine

## 2023-07-27 ENCOUNTER — Encounter

## 2023-07-27 VITALS — BP 128/80 | HR 82 | Temp 97.60000°F | Resp 13 | Wt 253.0 lb

## 2023-07-27 DIAGNOSIS — G8929 Other chronic pain: Secondary | ICD-10-CM

## 2023-07-27 LAB — COMPREHENSIVE METABOLIC PANEL
ALT: 21 U/L (ref 10–40)
AST: 20 U/L (ref 15–37)
Albumin/Globulin Ratio: 2.3 — ABNORMAL HIGH (ref 1.1–2.2)
Albumin: 4.2 g/dL (ref 3.4–5.0)
Alkaline Phosphatase: 88 U/L (ref 40–129)
Anion Gap: 9 (ref 3–16)
BUN: 21 mg/dL — ABNORMAL HIGH (ref 7–20)
CO2: 32 mmol/L (ref 21–32)
Calcium: 9 mg/dL (ref 8.3–10.6)
Chloride: 97 mmol/L — ABNORMAL LOW (ref 99–110)
Creatinine: 1.1 mg/dL (ref 0.8–1.3)
Est, Glom Filt Rate: 67 (ref >60)
Glucose: 146 mg/dL — ABNORMAL HIGH (ref 70–99)
Potassium: 4.6 mmol/L (ref 3.5–5.1)
Sodium: 138 mmol/L (ref 136–145)
Total Bilirubin: 0.5 mg/dL (ref 0.0–1.0)
Total Protein: 6 g/dL — ABNORMAL LOW (ref 6.4–8.2)

## 2023-07-27 LAB — CBC WITH AUTO DIFFERENTIAL
Basophils %: 0.8 %
Basophils Absolute: 0.1 10*3/uL (ref 0.0–0.2)
Eosinophils %: 3.6 %
Eosinophils Absolute: 0.4 10*3/uL (ref 0.0–0.6)
Hematocrit: 35 % — ABNORMAL LOW (ref 40.5–52.5)
Hemoglobin: 11.6 g/dL — ABNORMAL LOW (ref 13.5–17.5)
Lymphocytes %: 42 %
Lymphocytes Absolute: 4.4 10*3/uL (ref 1.0–5.1)
MCH: 29.8 pg (ref 26.0–34.0)
MCHC: 33.2 g/dL (ref 31.0–36.0)
MCV: 89.7 fL (ref 80.0–100.0)
MPV: 7.4 fL (ref 5.0–10.5)
Monocytes %: 7 %
Monocytes Absolute: 0.7 10*3/uL (ref 0.0–1.3)
Neutrophils %: 46.6 %
Neutrophils Absolute: 4.8 10*3/uL (ref 1.7–7.7)
Platelets: 187 10*3/uL (ref 135–450)
RBC: 3.9 M/uL — ABNORMAL LOW (ref 4.20–5.90)
RDW: 15.3 % (ref 12.4–15.4)
WBC: 10.4 10*3/uL (ref 4.0–11.0)

## 2023-07-27 LAB — LIPID PANEL
Cholesterol, Total: 135 mg/dL (ref 0–199)
HDL: 32 mg/dL — ABNORMAL LOW (ref 40–60)
LDL Cholesterol: 85 mg/dL (ref <100)
Triglycerides: 91 mg/dL (ref 0–150)
VLDL Cholesterol Calculated: 18 mg/dL

## 2023-07-27 LAB — TSH REFLEX TO FT4: TSH Reflex FT4: 3.36 u[IU]/mL (ref 0.27–4.20)

## 2023-07-27 MED ORDER — BACLOFEN 10 MG PO TABS
10 | ORAL_TABLET | Freq: Three times a day (TID) | ORAL | 4 refills | 25.00000 days | Status: AC
Start: 2023-07-27 — End: ?

## 2023-07-27 NOTE — Progress Notes (Signed)
 Subjective  Back Pain: Patient presents for presents evaluation of low back problems.  Symptoms have been present for 2 months and include  low back pain,  . Pain radiates to the lower extremities. Initial inciting event: none. Symptoms are worst: morning, mid-day, afternoon, evening, nighttime. Alleviating factors identifiable by patient are none. No bladder or bowel incontinence. Some paresthesias reported in he lower extremities. Exacerbating factors identifiable by patient are bending backwards, bending forwards, bending sideways, running, sitting, and standing. Treatments so far initiated by patient: none Previous lower back problems:  previous back surgery  . Previous workup:  see chart  . Previous treatments:  surgery  .  Type 2 diabetes mellitus with hyperglycemia, without long-term current use of insulin  (HCC)  Diabetes Mellitus Type II:  Home blood sugar records reviewed: fasting range: 120-150 .  No significant episodes of hypoglycemia. Compliant with medications.  No polyuria, polydipsia, visual changes, foot problems, GI upset.  Diabetic diet compliance:  compliant most of the time.  Current exercise: none. Will check labs, and refill medications as appropriate.     Hypertension:  Denies CP, SOB, visual changes, dizziness, palpitations or HA.  He is adherent to a low sodium diet.  Blood pressure typically runs ok  outside of the office. Continue current medications.     Hyperlipidemia:  No new myalgias or GI upset on current medications.                  Lab Results   Component Value Date     LABA1C 7.5 04/27/2021     LABA1C 8.7 (H) 03/28/2021     LABA1C 8.8 01/01/2021                Lab Results   Component Value Date     CREATININE 0.9 04/27/2021                Lab Results   Component Value Date     ALT 9 (L) 04/27/2021     AST 11 (L) 04/27/2021      No components found for: "CHLPL"            Lab Results   Component Value Date     TRIG 100 04/27/2021                Lab Results   Component Value Date      HDL 33 (L) 04/27/2021                Lab Results   Component Value Date/Time     LDLCALC 91 04/27/2021 04:05 PM      This problem was reviewed in detail. It is under control and stable. Will check blood work.        Spinal stenosis of lumbar region with neurogenic claudication  S/p surgery , doing ok.       Morbid obesity due to excess calories [facility])  Patient counseled,   Encouraged to lose weight, watch diet and exercise consistently.        Essential hypertension  This is a chronic problem. The problem is well controlled.  Patient monitors readings regularly. Pertinent negatives include no chest pain, focal sensory loss, focal weakness, leg pain, myalgias or shortness of breath. No headaches or chest pain. Takes medications regularly.  Blood pressure has been stable, blood work was reviewed, and advised patient to continue the current instructions or medications.        Acquired hypothyroidism  On synthroid ,  Patient is  compliant w medications, no side effects, effective, provides adequate symptom relief. No new symptoms or problems as noted by patient.  The problem is stable, no changes noted by patient. Will consider monitoring labs and refill medications as appropriate. Patient counseled and will continue current plan.       Pure hypercholesterolemia  This has been a long standing problem, takes lipitor        Monitors diet and tries to follow a low fat diet. Has  been reasonably  compliant w exercise. Lipids have been stable, The problem is controlled. Recent lipid tests were reviewed and are normal. Pertinent negatives include no chest pain, focal sensory loss, focal weakness, leg pain, myalgias or shortness of breath.  Objective  BP 128/80 (BP Site: Left Upper Arm, Patient Position: Sitting, BP Cuff Size: Large Adult)   Pulse 82   Temp 97.6 F (36.4 C)   Resp 13   Wt 114.8 kg (253 lb)   SpO2 98%   BMI 38.47 kg/m    Afebrile. Cardiovascular exam normal. Lungs are clear. Abdominal exam reveals no  flank tenderness.Back exam shows paraspinal muscle tenderness in the lower lumbar region on both sides. There is decreased range of motion in flexion.  Lateral movement and rotation of the spine are intact.  Straight leg raising is positive bilaterally.  No sensory or motor deficit is noted.  Deep tendon reflexes are 2+ and equal bilaterally.  Assessment  Low back pain  Lumbosacral radiculopathy.  Type 2 diabetes mellitus with hyperglycemia, without long-term current use of insulin  (HCC)  Diabetes Mellitus Type II:  Home blood sugar records reviewed: fasting range: 120-150 .  No significant episodes of hypoglycemia. Compliant with medications.  No polyuria, polydipsia, visual changes, foot problems, GI upset.  Diabetic diet compliance:  compliant most of the time.  Current exercise: none. Will check labs, and refill medications as appropriate.     Hypertension:  Denies CP, SOB, visual changes, dizziness, palpitations or HA.  He is adherent to a low sodium diet.  Blood pressure typically runs ok  outside of the office. Continue current medications.     Hyperlipidemia:  No new myalgias or GI upset on current medications.                  Lab Results   Component Value Date     LABA1C 7.5 04/27/2021     LABA1C 8.7 (H) 03/28/2021     LABA1C 8.8 01/01/2021                Lab Results   Component Value Date     CREATININE 0.9 04/27/2021                Lab Results   Component Value Date     ALT 9 (L) 04/27/2021     AST 11 (L) 04/27/2021      No components found for: "CHLPL"            Lab Results   Component Value Date     TRIG 100 04/27/2021                Lab Results   Component Value Date     HDL 33 (L) 04/27/2021                Lab Results   Component Value Date/Time     LDLCALC 91 04/27/2021 04:05 PM      This problem was reviewed in detail. It is under control and  stable. Will check blood work.        Spinal stenosis of lumbar region with neurogenic claudication  S/p surgery , doing ok.       Morbid obesity due to  excess calories [facility])  Patient counseled,   Encouraged to lose weight, watch diet and exercise consistently.        Essential hypertension  This is a chronic problem. The problem is well controlled.  Patient monitors readings regularly. Pertinent negatives include no chest pain, focal sensory loss, focal weakness, leg pain, myalgias or shortness of breath. No headaches or chest pain. Takes medications regularly.  Blood pressure has been stable, blood work was reviewed, and advised patient to continue the current instructions or medications.        Acquired hypothyroidism  On synthroid ,  Patient is compliant w medications, no side effects, effective, provides adequate symptom relief. No new symptoms or problems as noted by patient.  The problem is stable, no changes noted by patient. Will consider monitoring labs and refill medications as appropriate. Patient counseled and will continue current plan.       Pure hypercholesterolemia  This has been a long standing problem, takes lipitor        Monitors diet and tries to follow a low fat diet. Has  been reasonably  compliant w exercise. Lipids have been stable, The problem is controlled. Recent lipid tests were reviewed and are normal. Pertinent negatives include no chest pain, focal sensory loss, focal weakness, leg pain, myalgias or shortness of breath.  Plan  Explained to the patient the possible causes of lumbar radiculopathy, including sciatica and disc herniations. Conservative treatment is recommended initially. Back stretching was reviewed with the patient, and will be performed twice daily.  Moist heat locally.  Back protective techniques reviewed, along with sleep positioning. Start antiinflammatories, muscle relaxants and pain medications. Consider Physical Therapy and XRay studies or even MRI if not improving. Call or return to clinic prn if these symptoms worsen or fail to improve as anticipated.

## 2023-07-28 LAB — HEMOGLOBIN A1C
Estimated Avg Glucose: 125.5 mg/dL
Hemoglobin A1C: 6 %

## 2023-07-31 NOTE — Telephone Encounter (Signed)
 Pt called in stating he needs a new script for his handicap plaq letter. Please Advise. Once finished mail to pt.

## 2023-07-31 NOTE — Telephone Encounter (Signed)
 Ok to put this in for him. I will sign,

## 2023-07-31 NOTE — Telephone Encounter (Addendum)
 Patient informed.

## 2023-08-07 ENCOUNTER — Encounter

## 2023-08-07 NOTE — Telephone Encounter (Signed)
 Lvm to see if pt can come at 1:45pm. Asked to give us  a call.

## 2023-08-07 NOTE — Telephone Encounter (Signed)
 Patient informed.

## 2023-08-07 NOTE — Telephone Encounter (Signed)
 Pt called in for a referral for orthopedics for his back. Please call pt when referral is in. Pt stated he wanted to go to [facility]     [facility] fax- [phone]

## 2023-08-07 NOTE — Telephone Encounter (Signed)
Referral placed in the chart

## 2023-08-08 ENCOUNTER — Ambulatory Visit: Admit: 2023-08-08 | Discharge: 2023-08-08 | Payer: Medicare (Managed Care) | Attending: Acute Care

## 2023-08-08 DIAGNOSIS — R063 Periodic breathing: Secondary | ICD-10-CM

## 2023-08-08 MED ORDER — ATORVASTATIN CALCIUM 10 MG PO TABS
10 | ORAL_TABLET | Freq: Every day | ORAL | 1 refills | 90.00000 days | Status: DC
Start: 2023-08-08 — End: 2024-01-19

## 2023-08-08 NOTE — Patient Instructions (Signed)
 CLEANING INSTRUCTIONS FOR PAP EQUIPMENT     Keeping your equipment and supplies clean is very important.     REMINDER: Only use DISTILLED WATER in your humidifier, Empty water daily.    DAILY  Mask and tubing:   Wash your face before applying mask  Wash mask and tubing with baby shampoo and warm water.     Humidifier:   Empty water in reservoir  Clean with baby shampoo and warm water  Rinse, then air dry    WEEKLY  Mask and tubing:   Soak your mask and tubing in 1 part vinegar and 3 parts water for 30 minutes. Rinse, and allow to air dry.     Humidifier:   Wash with warm water and baby shampoo  Soak in 1 part vinegar and 3 parts water for 30 minutes  Rinse with warm water and allow to air dry    Programme researcher, broadcasting/film/video:   Wipe with a clean damp cloth    MONTHLY AND/OR AS NEEDED  Reusable foam filters (black filter)- wash in warm water with baby shampoo. Rinse well and dry with paper towel  Disposable felt filter (white filter)- Replace filter every two weeks to once a month    NOTE: If you are having repeated sinus and /or respiratory infections, dirty equipment may be the cause. It may help to clean and disinfect your equipment more frequently    TRAVELING  Always make sure the humidifier is empty when traveling. (including doctor appointments, air travel or long distance driving).  When flying, always carry your PAP device with you as a carry on item, NEVER check it in as baggage. We can provide you with a letter stating it is a medical device, talk to your provider.    Always make sure you have your mask and tubing with you. You will need appropriate plug adapters when traveling outside the [place].    If travelling by air and you are unable to carry distilled water with youuse bottled water (no more than 2 weeks). DO NOT USE TAP WATER.

## 2023-08-08 NOTE — Progress Notes (Signed)
 NAME: [name]  MRN: [id number]  Author:Shauntel Prest ANNETTE Sunshyne Horvath, CNP  Date:08/08/2023    Last seen 08/02/2022 by Myself    CHIEF COMPLAINT:   83 y.o. 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  He is using the ASV machine nightly. His average nightly use is 6 hours and 58 minutes with an AHI of 12.1. He adjusted the full mask to below the chin after the last visit. I kept hearing air leaks and after I moved it down, I don't hear leaks anymore. + Significant air leak present on the download.  He is not having any problems receiving equipment as needed. +tolerating the pressure well.  No reports of snoring with PAP treatment.  He has improved sleep comfort and daytime alertness with therapy.    Bedtime: 11 PM  SLT: 15  minutes  Awakenings during the night: 1-2 to use the restroom  Wake up: 8 AM   Naps: Yes 60 minutes/daily    Mood is good    No other sleep concerns.    Weight stable.     PAP Data Download:  Equipment:  ResMed -  min EPAP 6, max EPAP 15, min pressure support 4, Max pressure support 15 cwp   Mask: Full  No chinstrap  Equipment download:   Data date range:07/05/2023-08/03/2023  Downloaded data shows that over 30 nights the patient has used the device 30 nights.   The average use on nights used = 6 hours 58 minutes.  The percentage of nights used more than 4 hours =100%.  The machine reported AHI is 12.1.  95th percentile leak was 118.4 L/minute. IPAP Pressure: Median: 11.6, 95th percentile:19.4 and Maximum:20.9. EPAP Pressure: Median:6, 95th percentile:6.2 and Maximum:6.3  Magnetic clasps on headgear:  No  Medical device or implants:    Safety notification discussed:      Uses a so clean    Prior Diagnostic Testing:    CPAP/BPAP Location: [facility]  Date of CPAP/BPAP (78295): 03/09/17  CmH2O Pressure prescribed: 7/7/0/15/25  AHI at  Prescribed:4.6  Lowest SpO2% at Prescribed:83%  PLMI:     PLMAI:  Weight: 257 pounds  Other:    Date of CPAP: 12/06/2008   SE %: 76.5   AHI: 1.1   Lowest p ox %: 90.0   CPAP performed at: [facility] @ time of study: 265 lbs   DME Company: Geophysicist/field seismologist type: full face   cmH2O: 13     Date of NPSG: 05/27/2003   SE %: 63.5   AHI: 52   Lowest p ox %: 73   NPSG performed at: [facility]     Wt Readings from Last 3 Encounters:   08/08/23 (!) 255 lb (115.7 kg)   08/02/22 (!) 257 lb (116.6 kg)   10/25/21 (!) 255 lb (115.7 kg)       PAST MEDICAL HISTORY:  Surgical history:  Past Surgical History:   Procedure Laterality Date    COLONOSCOPY W/ POLYPECTOMY      benign  has at 3 plus procedures    HERNIA REPAIR  9/09    right inguinal, mini maze    HERNIA REPAIR  2/10    herniated lungs bilateral (repaired with mesh)    mini-maze      corrected his A-fib  rt eye  02/2018    SEPTOPLASTY      UVULOPALATOPHARYNGOPLASTY      UVULOPALATOPHARYNGOPLASTY      has had esophageal dilations in the past (Dr.Decter)    WISDOM TOOTH EXTRACTION         Medical history:  Past Medical History:   Diagnosis Date    Atrial fibrillation (CMS-HCC)     corrected with Mini-maze procedure    Cataract     left eye    Diabetes mellitus (CMS-HCC)     type 2    Dilated cardiomyopathy (CMS-HCC)     Hearing loss     left ear    Hypercholesteremia     Hypertension     Sleep apnea     Thyroid disease     hypothyroidism       Medications:  Current Outpatient Medications   Medication Sig    apixaban  Take 1 tablet (5 mg total) by mouth 2 times a day.    aspirin  Take 1 tablet (81 mg total) by mouth daily.    atorvastatin  Take 1 tablet (10 mg total) by mouth at bedtime.    carvediloL  Take 1 tablet (25 mg total) by mouth 2 times a day.    ferrous sulfate  Take 1 tablet (325 mg total) by mouth daily with breakfast.    furosemide  Take 1 tablet (20 mg total) by mouth daily.    glimepiride Take 1 tablet by mouth  every morning before  breakfast     GLUC/CHON-MSM#1/C/MANG/BOS/BOR (OSTEO BI-FLEX ORAL) Take by mouth. OSTEO BI-FLEX JOINT SHIELD TABS     glyBURIDE -metformin  Take 2 tablets by mouth 2 times a day.    levothyroxine  Take 1 tablet (100 mcg total) by mouth every morning.    omeprazole Take 1 capsule (40 mg total) by mouth every morning.    propafenone  Take 1 tablet (225 mg total) by mouth 3 times a day.    tamsulosin  Take 1 capsule (0.4 mg total) by mouth at bedtime.    valsartan  Take 1 tablet (320 mg total) by mouth daily.     No current facility-administered medications for this visit.         Allergies:House dust    Family history:  Family History   Problem Relation Age of Onset    Diabetes Mother         type 2    Hypertension Mother     Coronary artery disease Sister     Diabetes Sister         type 2    Coronary artery disease Sister     Coronary artery disease Brother     Hypertension Brother     Diabetes Brother         type 2    Hypertension Brother        Social history:  Social History     Socioeconomic History    Marital status: Married     Spouse name: None    Number of children: None    Years of education: None    Highest education level: None   Occupational History    None   Tobacco Use    Smoking status: Never    Smokeless tobacco: Never    Tobacco comments:     03/19/07   Substance and Sexual Activity    Alcohol use: No    Drug use: No     Comment: 04-14-11    Sexual activity: None   Other Topics Concern  Caffeine Use No    Occupational Exposure Yes     Comment: retired Energy manager    Exercise Yes    Seat Belt Yes   Social History Narrative    None     Social Drivers of Health     Financial Resource Strain: Low Risk  (04/20/2022)    Received from [facility]    Overall Financial Resource Strain (CARDIA)     Difficulty of Paying Living Expenses: Not hard at all   Food Insecurity: No Food Insecurity (08/08/2023)    Yearly Questionnaire     Do you need any assistance with  obtaining housing, meals, medication, transportation or medical equipment?: No     Assistance needed for:: Not on file   Transportation Needs: No Transportation Needs (08/08/2023)    Yearly Questionnaire     Do you need any assistance with obtaining housing, meals, medication, transportation or medical equipment?: No     Assistance needed for:: Not on file   Physical Activity: Inactive (10/17/2022)    Received from [facility]    Exercise Vital Sign     Days of Exercise per Week: 0 days     Minutes of Exercise per Session: 0 min   Stress: No Stress Concern Present (01/01/2021)    Received from [facility]    [employer] of Occupational Health - Occupational Stress Questionnaire     Feeling of Stress : Not at all   Social Connections: Moderately Isolated (01/01/2021)    Received from [facility]    Social Connection and Isolation Panel [NHANES]     Frequency of Communication with Friends and Family: Three times a week     Frequency of Social Gatherings with Friends and Family: Three times a week     Attends Religious Services: Never     Active Member of Clubs or Organizations: No     Attends Banker Meetings: Never     Marital Status: Married   Catering manager Violence: Not on file   Housing Stability: Low Risk  (08/08/2023)    Yearly Questionnaire     Do you need any assistance with obtaining housing, meals, medication, transportation or medical equipment?: No     Assistance needed for:: Not on file       Review of Systems    Constitutional:  Positive for fatigue.   HENT:  Positive for runny nose and dry mouth. Negative for congestion and sore throat.    Cardiovascular: Negative.  Negative for chest pain, palpitations and leg swelling.   Psychiatric/Behavioral: Negative.  Negative for decreased concentration and depression. The patient is not anxious.     Gastrointestinal: Negative.  Negative for heartburn.   Genitourinary:  Positive for Awaken to urinate. Negative for Unable to control urine, frequency and urgency.   Musculoskeletal:  Positive for back pain. Negative for arthralgias and myalgias.   Skin: Negative.  Negative for rash and wound.   Neurological: Negative.  Negative for dizziness, headaches and waking up with a headache.   Respiratory: Negative.  Negative for cough, shortness of breath and wheezing.        I have reviewed the review of system and advised patient to contact primary care physician or specialist for non sleep related issues.    ROS  has been reviewed and updated with the  patient during this visit.    PHYSICAL EXAMINATION:  BP 118/76 (BP Location: Left upper arm, Patient Position: Sitting, BP Cuff Size: Large)   Pulse 76   Ht 6' (1.829 m)   Wt (!) 255 lb (115.7 kg)   SpO2 97%   BMI 34.58 kg/m   GENERAL:  Well developed well-nourished male +obese Body mass index is 34.58 kg/m. in NAD and good historian.  SKIN: No obvious rashes. Warm and dry.  HEAD: Normocephalic and atraumatic. +Facial hair.   EYES:    Pupils equal, reactive and conjugate                 Sclera are nonicteric  Ears:  Ears appear normal externally bilateral   Throat: Thyroid is not palpable           Cervical Lymphadenopathy not present            Mallampati-IV. Oropharynx is moist and pink.   Lungs:  Clear, without wheezes rales or rhonchi. No stridor. No respiratory distress. Chest wall normal  CV:  Normal S1 S2, Regular rate and rhythm. No murmurs, rubs or gallops.    MS:  Without cyanosis or clubbing. No peripheral edema   NEURO:  Alert and oriented; Normal gait   PSYCH:  Well groomed, calm, pleasant and appropriate. Insight and judgment appear good    LABORATORY TESTS:    Lab Results   Component Value Date    IRON  16 (L) 05/24/2019    TIBC 390 05/24/2019     Lab Results   Component Value Date    TSH 3.63 11/09/2019    FREET4 1.21 11/09/2019     Lab Results    Component Value Date    WBC 11.2 (H) 10/25/2021    HGB 11.9 (L) 10/25/2021    HCT 36.9 (L) 10/25/2021    MCV 83.8 10/25/2021    PLT 156 10/25/2021     No components found for: HBA1C  Lab Results   Component Value Date    GLUCOSE 238 (H) 10/25/2021    BUN 17 10/25/2021    CO2 28 10/25/2021    K 4.2 10/25/2021    NA 135 10/25/2021    CL 99 10/25/2021    CALCIUM 9.0 10/25/2021     No components found for: LASTABGRESULT     Lab results reviewed.  Patient will follow-up with ordering provider for abnormal labs.      Valerio Gathers                        Department of Cardiology*                          [address]                         [place], [place] [address]                              [phone]     Transthoracic Echocardiography     Patient:    Martrez, Mahaffy   MR #:       [id number]   Account:   Study Date: 11/11/2019   Gender:     M   Age:        4   DOB:        Aug 24, 1940  Room:       [facility]      REFERRING    Melton Squires,    PERFORMING   [facility], [facility]    SONOGRAPHER  Valla Gauss RDCS, RVT    ORDERING     Melton Squires, Marissa [facility]    [place], [place] [facility]   Derick Fleeting, Darnelle Elders    ATTENDING    Melton Squires, Marissa Deadra Everts    ADMITTING    Jearlean Mince     -------------------------------------------------------------------     Procedure:ECHO 2D COMPLETE W/CONTRAST (TTE)            Order: Accession Number:[id number]     -------------------------------------------------------------------   Indications:    Shortness of breath R06.02.     -------------------------------------------------------------------   PMH:  Chest pain.  Atrial fibrillation.  Congestive heart failure.    Risk factors:  Hypertension.     -------------------------------------------------------------------   Study data:  Height: 72in. 182.9cm. Weight: 254.5lb. 115.7kg.   Study status:  Routine.  Procedure:  Transthoracic   echocardiography. Image quality was  good. Scanning was performed   from the parasternal, apical, and subcostal acoustic windows.   Intravenous contrast (Optison ) 6 ml was administered.   Transthoracic echocardiography.  M-mode, complete 2D, complete   spectral Doppler, and color Doppler.  Birthdate:  Patient   birthdate: [birth year].  Age:  Patient is 83yr old.  Sex:  Birth   gender: male.  Body mass index:  BMI: 34.6kg/m^2.  Body surface   area:    BSA: 2.73m^2.  Blood pressure:     152/80  Patient status:    Inpatient.  Study date:  Study date: 11/11/2019. Study time: 10:54   AM.  Location:  Echo laboratory.     -------------------------------------------------------------------   Study Conclusions     - Left ventricle: The cavity size is normal. Wall thickness was increased increased in a pattern of     mild to moderate LVH. Systolic function was normal. The estimated ejection fraction was in the     range of 55% to 60%. Wall motion was normal; there were no regional wall motion abnormalities.     Abnormal relaxation with increased filling pressures.   - Right ventricle: Systolic function was normal. TAPSE: 1.8cm.Tricuspid annular systolic velocity:     9.5cm/s.     Last echo results reviewed.    EPWORTH SLEEPINESS SCALE: (at or above 10 is abnormal and 24 is the maximum score)      06/19/2017     4:00 PM 04/23/2018     3:00 PM 04/25/2019     3:40 PM 06/02/2020    10:53 AM 06/30/2021    11:02 AM 08/02/2022     2:33 PM 08/08/2023     2:11 PM   Epworth Sleepiness Scale   Sitting and reading 1 1 0 1 1 2 1    Watching TV 1 1 0 1 2 2 3    Sitting, inactive in a public place (e.g. a theatre or a meeting) 1 0 0 1 0 0 1   As a passenger in a car for an hour without a break 0 0 0 0 1 0 1   Lying down to rest in the afternoon when circumstances permit 1 1 3 1 2 2 3    Sitting and talking to someone 0 0 0 1 0 0 0   Sitting quietly after a lunch without alcohol 0 0 0 1  0 1 0   In a car, while stopped for a few minutes in traffic 0 0 0 0 0 0 0   Total score 4 3 3 6 6 7 9         Assessment    Assessment/plan:  1. 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  Efficacy data reviewed and discussed with patient.  AHI control is suboptimal.  Large air leaks and elevated AHI present.  Has adjusted the way the mask fits since his last visit.  He is using the device all hours of sleep.  His nightly average use time is nearly 7 hours over the last 30 days.    Proper mask fit reviewed during the visit.  Pt agrees to have a mask fitting with supply company.     Disposable Pap equipment order updated.  He will contact the supply company as needed.    Cardiovascular health risks associated with OSA reviewed.  He was encouraged to continue excellent compliance.    Proper handwashing cleaning information provided on discharge summary.    2. Obesity  Body mass index is 34.58 kg/m.  Weight stable over the last year.    Exercise as tolerated encouraged.    History of diabetes mellitus, hearing loss, hypertension, atrial fibrillation, cataracts, dilated cardiomyopathy, hypercholesterolemia and thyroid disease.    Follow-up appointment:Return visit in 4 months.         A copy of my Plan and Assessment will be sent to: [facility]    Medical Decision Making:  The following items were considered in medical decision making:  Review / order radiology tests  Review / order other diagnostic tests/interventions    Electronically signed by Lizette Righter Kassy Mcenroe, CNP 08/09/2023 2:25 PM    Body mass index is 34.58 kg/m.  BP Readings from Last 3 Encounters:   08/08/23 118/76   08/02/22 134/82   10/26/21 158/83       SLEEP QUALITY MEASURES:   Obstructive Sleep Apnea Follow Up Visit:   Adherence to therapy documented at least annually?: Yes    Reassessment of excessive daytime sleepiness?: Yes    Weight / body mass index assessment at visit?: Yes    If body mass index greater than 25 was discussion of weight management discussed? : Yes    Blood pressure assessed at visit? : Yes           *This note was transcribed with computerized voice recognition technology.  This note has been reviewed for accuracy however inadvertent transcription errors may be present.    This note was completely edited, written and reviewed by me and consists of information cut and pasted from the my most recent visit, my smart phrases and other Epic tools. I have personally reviewed all aspects of this note to at least include reviewing this patient's chart and problem list, updating the history, physical exam, lab and procedure results, and assessment and plan as detailed above and below.  As such this visit note reflects my current evaluation and management for this patient.

## 2023-08-08 NOTE — Telephone Encounter (Signed)
 Medication:   Requested Prescriptions     Pending Prescriptions Disp Refills    atorvastatin  (LIPITOR ) 10 MG tablet [Pharmacy Med Name: ATORVASTATIN  CALCIUM  10MG  TABS] 90 tablet 1     Sig: TAKE ONE TABLET BY MOUTH EVERY DAY     Last Filled:  02/27/2023    Last appt: 07/27/2023   Next appt: Visit date not found    Last Lipid:   Lab Results   Component Value Date/Time    CHOL 135 07/27/2023 09:45 AM    TRIG 91 07/27/2023 09:45 AM    HDL 32 07/27/2023 09:45 AM    HDL 34 05/04/2010 12:30 PM

## 2023-08-11 MED ORDER — METFORMIN HCL 500 MG PO TABS
500 | ORAL_TABLET | Freq: Two times a day (BID) | ORAL | 1 refills | 90.00000 days | Status: DC
Start: 2023-08-11 — End: 2024-01-18

## 2023-08-11 NOTE — Progress Notes (Signed)
 Request for renewal script for disposable pap supplies  order dated 08/08/23 to be pulled sent via community messenger to [facility]ME [facility] company .

## 2023-08-11 NOTE — Telephone Encounter (Signed)
 Medication:   Requested Prescriptions     Pending Prescriptions Disp Refills    metFORMIN  (GLUCOPHAGE ) 500 MG tablet [Pharmacy Med Name: METFORMIN  HCL 500MG  TABS] 360 tablet 1     Sig: TAKE TWO TABLETS BY MOUTH TWICE A DAY WITH MEALS     Last Filled:  02/20/2023    Last appt: 07/27/2023   Next appt: Visit date not found    Last Labs DM:   Lab Results   Component Value Date/Time    LABA1C 6.0 07/27/2023 09:45 AM

## 2023-08-11 NOTE — Progress Notes (Unsigned)
 History of Present Illness   HPI: 83 y.o. male presents with complaints of chronic low back pain. He has   surgical history significant for Bilateral decompressive laminectomy, L3, 4   and synovial cyst excision in 03/2021. Patient states he has had good symptom   relief until the last 8-12 months, and these symptoms and pain have   progressively worsened since onset. He states that he is also experiencing   weakness in the BLE. He denies pain, numbness or tinlging in the BLE. He   states that he has had some falls with no injury due to the weakness in his   legs. He states he has tried muscle relaxers and tylenol  arthritis, which have   not improved his pain. He states he has not completed Physical Therapy since   after his surgery in 2022. He states his current pain level is 3/10 in the low   back. he deies any red flag symptoms currently.         Review of Histories and Systems   I reviewed the patient's past medical history, social history, family history,   and review of systems. The patient recorded this information in our chart and   I reviewed it personally.      Physical Exam   Constitutional: Pt appears stated age. He is well-developed, well-nourished.   He is A&O x 4, no apparent distress. He is cooperative with the exam with   normal mood and affect.    Cardiovascular: There is no evidence of varicosities, clubbing or edema.   Distal pulses are intact and symmetric. Rate is normal. Skin is pink and warm.    Respiratory: There is no evidence of labored breathing.     MSK: Gait and station are normal. Palpation of the lumbar spine does not   produce any tenderness. There is no palpable step off. Positive facet loading   maneuvers bilaterally. Muscle tone of the lower extremities and lumbar spine   is normal. Knee and hip joints are stable bilaterally without any evidence of   subluxation, pain or crepitus.    Neuro: Patellar and achilles reflexes are 1+. SLR is negative bilaterally.   Sensation of the  lower extremities is normal. Muscular strength is as   described below:    LLE: HF: 5/5, KE:5/5, KF: 5/5, PF: 5/5, DF: 5/5  RLE: HF: 5/5, KE:5/5, KF: 5/5, PF: 5/5, DF: 5/5        Review of Testing   Review of Testing: MRI of the Lumbar Spine completed on 02/12/2021:  There is some mild rightward convex curvature of the lower thoracic spine. No   fracture or subluxation is identified. The conus terminates at the mid L1   vertebral body level.  T11-T12: There is some marginal osteophyte formation.  T12-L1: There is some marginal osteophyte formation.  L1-L2: There is some right anterior osteophyte formation. There is a mild disc   bulge.  L2-3: There is some right-sided disc thinning. There is some degenerative   spurring. There is degenerative disc desiccation. There is a broad posterior   protrusion type disc herniation with right foraminal disc herniation. There is   ligamentum flavum hypertrophy. There is moderate bilateral facet joint   spondylosis. There is some hypertrophy of the posterior epidural fat. The   multifactorial spondylosis results in severe central canal stenosis. The   findings are stable  L3-L4: degenerative disc desiccation. There is some degenerative spurring.   There is a broad posterior protrusion type  disc herniation which flattens the   anterior aspect of the thecal sac. There is ligamentum flavum hypertrophy.   There is facet joint spondylosis. There is a 12 mm x 6 mm x 14 mm synovial   cyst arising from the inner aspect of the degenerated right L3-4 facet joint.   This impresses upon the posterior lateral aspect of the thecal sac and narrows   right lateral recess. The multifactorial spondylosis results in severe   central canal stenosis. The findings are without significant change from the   prior exam.  L4-L5: broad posterior protrusion type disc herniation which flattens the   anterior aspect of the thecal sac. There is ligamentum flavum hypertrophy.   There are some mild hypertrophic  degenerative facet joint changes. The   multifactorial spondylosis results in severe central canal stenosis.   Tmultifactorial spondylosis results in severe central canal stenosis. The   findings are without significant change from the prior exam.  L5-S1: The L5 vertebral body is partially sacralized.    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 He has surgical   history significant for Bilateral decompressive laminectomy, L3, 4 and   synovial cyst excision in 03/2021. MRI of the Lumbar spine from 2022 Reviewed.   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     Plan   New Orders:  Level 4 [id number] [[id number]]  Additional Plan Details:   Due to most recent imaging of the Lumbar Spine from 2022, will order X-ray of   the Lumbar Spine today. Will refer patient to physical therapy as well. Follow   up after completing physical therapy, or RTC sooner if worsening symptoms.   Patient verbalized understanding is agreeable with the plan.            Electronically Signed by Leontine Rana CNP on 08/11/2023 at 2:22 PM  ________________________________________________________________________

## 2023-08-28 MED ORDER — CARVEDILOL 25 MG PO TABS
25 | ORAL_TABLET | Freq: Two times a day (BID) | ORAL | 1 refills | 30.00000 days | Status: DC
Start: 2023-08-28 — End: 2024-03-05

## 2023-08-28 NOTE — Telephone Encounter (Signed)
 Medication:   Requested Prescriptions     Pending Prescriptions Disp Refills    carvedilol  (COREG ) 25 MG tablet [Pharmacy Med Name: CARVEDILOL  25 MG TABLET] 180 tablet 1     Sig: TAKE 1 TABLET BY MOUTH 2 TIMES A DAY     Last Filled:  04/06/2023    Last appt: 07/27/2023   Next appt: Visit date not found    Last OARRS:       03/24/2021    12:59 PM   RX Monitoring   Periodic Controlled Substance Monitoring Possible medication side effects, risk of tolerance/dependence & alternative treatments discussed.;No signs of potential drug abuse or diversion identified.

## 2023-09-15 MED ORDER — OMEPRAZOLE 40 MG PO CPDR
40 | ORAL_CAPSULE | Freq: Every day | ORAL | 0 refills | 90.00000 days | Status: DC
Start: 2023-09-15 — End: 2023-12-26

## 2023-09-15 NOTE — Telephone Encounter (Signed)
 Medication:   Requested Prescriptions     Pending Prescriptions Disp Refills    omeprazole  (PRILOSEC) 40 MG delayed release capsule [Pharmacy Med Name: OMEPRAZOLE  40MG  CPDR] 90 capsule 0     Sig: TAKE 1 CAPSULE BY MOUTH DAILY     Last filled: 04/06/23  Last appt: 07/27/2023   Next appt: Visit date not found    Last OARRS:       03/24/2021    12:59 PM   RX Monitoring   Periodic Controlled Substance Monitoring Possible medication side effects, risk of tolerance/dependence & alternative treatments discussed.;No signs of potential drug abuse or diversion identified.

## 2023-09-20 MED ORDER — TRADJENTA 5 MG PO TABS
5 | ORAL_TABLET | Freq: Every day | ORAL | 0 refills | 30.00000 days | Status: AC
Start: 2023-09-20 — End: ?

## 2023-09-20 NOTE — Telephone Encounter (Signed)
 Medication:   Requested Prescriptions     Pending Prescriptions Disp Refills    TRADJENTA  5 MG tablet [Pharmacy Med Name: TRADJENTA  5MG  TABS] 90 tablet 0     Sig: TAKE ONE TABLET BY MOUTH EVERY DAY     Last Filled:  07/03/2023    Last appt: 07/27/2023   Next appt: Visit date not found    Last OARRS:       03/24/2021    12:59 PM   RX Monitoring   Periodic Controlled Substance Monitoring Possible medication side effects, risk of tolerance/dependence & alternative treatments discussed.;No signs of potential drug abuse or diversion identified.

## 2023-10-16 ENCOUNTER — Emergency Department: Admit: 2023-10-16 | Payer: Medicare (Managed Care)

## 2023-10-16 ENCOUNTER — Inpatient Hospital Stay
Admit: 2023-10-16 | Discharge: 2023-10-16 | Disposition: A | Discharge status: Home / Self Care | Payer: Medicare (Managed Care) | Attending: Student in an Organized Health Care Education/Training Program

## 2023-10-16 DIAGNOSIS — S0990XA Unspecified injury of head, initial encounter: Principal | ICD-10-CM

## 2023-10-16 DIAGNOSIS — G8929 Other chronic pain: Principal | ICD-10-CM

## 2023-10-16 MED ORDER — gabapentin (NEURONTIN) 100 MG capsule
100 | ORAL_CAPSULE | Freq: Three times a day (TID) | ORAL | 0 refills | 30.00000 days | Status: AC
Start: 2023-10-16 — End: ?

## 2023-10-16 MED ORDER — lidocaine (LIDODERM) 5 % 1 patch
5 | Freq: Once | TOPICAL
Start: 2023-10-16 — End: 2023-10-16
  Administered 2023-10-16: 16:00:00 via TRANSDERMAL

## 2023-10-16 MED ORDER — methocarbamoL (ROBAXIN) 500 MG tablet
500 | ORAL_TABLET | Freq: Three times a day (TID) | ORAL | 0 refills | 10.00000 days | Status: AC | PRN
Start: 2023-10-16 — End: ?

## 2023-10-16 MED ORDER — methocarbamoL (ROBAXIN) tablet 500 mg
500 | Freq: Once | ORAL | Status: AC
Start: 2023-10-16 — End: 2023-10-16
  Administered 2023-10-16: 16:00:00 via ORAL

## 2023-10-16 MED ORDER — acetaminophen (TYLENOL) tablet 975 mg
325 | Freq: Once | ORAL | Status: AC
Start: 2023-10-16 — End: 2023-10-16
  Administered 2023-10-16: 16:00:00 via ORAL

## 2023-10-16 MED ORDER — ketorolac (TORADOL) injection 15 mg
15 | Freq: Once | INTRAMUSCULAR | Status: AC
Start: 2023-10-16 — End: 2023-10-16
  Administered 2023-10-16: 16:00:00 via INTRAMUSCULAR

## 2023-10-16 MED ORDER — LOSARTAN POTASSIUM-HCTZ 100-25 MG PO TABS
100-25 | ORAL_TABLET | Freq: Every day | ORAL | 1 refills | 90.00000 days | Status: DC
Start: 2023-10-16 — End: 2024-03-21

## 2023-10-16 MED FILL — METHOCARBAMOL 500 MG TABLET: 500 500 MG | ORAL | Qty: 1 | Fill #0

## 2023-10-16 MED FILL — TYLENOL 325 MG TABLET: 325 325 mg | ORAL | Qty: 3 | Fill #0

## 2023-10-16 MED FILL — KETOROLAC 15 MG/ML INJECTION SOLUTION: 15 15 mg/mL | INTRAMUSCULAR | Qty: 1 | Fill #0

## 2023-10-16 MED FILL — LIDOCAINE 5 % TOPICAL PATCH: 5 5 % | TOPICAL | Qty: 1 | Fill #0

## 2023-10-16 NOTE — ED Notes (Signed)
 Discharge instructions reviewed. Pt has no questions or concerns at this time.

## 2023-10-16 NOTE — ED Triage Notes (Signed)
 Fall 2 days ago, reports hitting the back of his head  On Eliquis   Reports increased back/right leg pain with numbness to the left leg  Hx of back surgery 2022  Uses walker for ambulation

## 2023-10-16 NOTE — ED Provider Notes (Signed)
 [facility] ED Note    Date of service:  10/16/2023  Reason for Visit: Fall      Patient History     HPI:  [name] is a 83 y.o. male with a PMHx of atrial fibrillation s/p mini maze procedure on Eliquis , T2DM, HTN, HLD, and dilated cardiomyopathy who presents with back pain.     He has a history of chronic low back pain, has a remote history of prior surgery several years ago but no recent instrumentation. He has been following with [facility] and recently completed 6 weeks of physical therapy approximately 2-3 weeks ago. They are planning on an outpatient MRI, which is currently scheduled for August 12. He has been taking Tylenol  as needed for pain, but states that his pain overall has been gradually worsening which prompted him to present to the emergency department today. The pain is primarily located in the left lumbar region but does radiate down the back of the left thigh, stopping at about the level of the left knee. He notes that the pain is burning/stinging and also reports some paresthesias without true sensory loss. He had a fall 2 days ago where he states that he lost his balance while gardening and fell backwards onto his buttocks. He did not hit his head or lose consciousness, though he is anticoagulated on Eliquis . He denies any recent fevers. Denies any saddle anesthesia or bowel or bladder incontinence. He has still been ambulating at home with his walker.    Past Medical History:   Diagnosis Date    Atrial fibrillation (CMS-HCC)     corrected with Mini-maze procedure    Cataract     left eye    Diabetes mellitus (CMS-HCC)     type 2    Dilated cardiomyopathy (CMS-HCC)     Hearing loss     left ear    Hypercholesteremia     Hypertension     Sleep apnea     Thyroid disease     hypothyroidism     Past Surgical History:   Procedure Laterality Date    BACK SURGERY  2022    COLONOSCOPY W/ POLYPECTOMY      benign  has at 3 plus procedures    HERNIA  REPAIR  12/2007    right inguinal, mini maze    HERNIA REPAIR  05/2008    herniated lungs bilateral (repaired with mesh)    mini-maze      corrected his A-fib    rt eye  02/2018    SEPTOPLASTY      UVULOPALATOPHARYNGOPLASTY      UVULOPALATOPHARYNGOPLASTY      has had esophageal dilations in the past (Dr.Decter)    WISDOM TOOTH EXTRACTION       Discharge Medication List as of 10/16/2023  2:23 PM        CONTINUE these medications which have NOT CHANGED    Details   apixaban  (ELIQUIS ) 5 mg Tab Take 1 tablet (5 mg total) by mouth 2 times a day., Historical Med      aspirin  81 MG EC tablet Take 1 tablet (81 mg total) by mouth daily., Historical Med      atorvastatin  (LIPITOR ) 10 MG tablet Take 1 tablet (10 mg total) by mouth at bedtime., Historical Med      carvedilol  (COREG ) 25 MG tablet Take 1 tablet (25 mg total) by mouth 2 times a day., Starting Tue 04/15/2008, Historical Med      ferrous sulfate  325 (  65 FE) MG tablet Take 1 tablet (325 mg total) by mouth daily with breakfast., Historical Med      furosemide  (LASIX ) 20 MG tablet Take 1 tablet (20 mg total) by mouth daily., Starting Fri 11/15/2019, Normal, Disp-30 tablet, R-0      glimepiride (AMARYL) 2 MG tablet Take 1 tablet by mouth  every morning before  breakfast, Historical Med      GLUC/CHON-MSM#1/C/MANG/BOS/BOR (OSTEO BI-FLEX ORAL) Take by mouth. OSTEO BI-FLEX JOINT SHIELD TABS , Starting 11/28/2008, Until Discontinued, Historical Med      glyBURIDE -metformin  (GLUCOVANCE) 2.5-500 mg per tablet Take 2 tablets by mouth 2 times a day., Starting Wed 04/16/2008, Historical Med      levothyroxine  (SYNTHROID , LEVOTHROID) 100 MCG tablet Take 1 tablet (100 mcg total) by mouth every morning., Starting Tue 03/14/2006, Historical Med      omeprazole (PRILOSEC) 40 MG capsule Take 1 capsule (40 mg total) by mouth every morning., Starting Tue 04/15/2008, Historical Med      propafenone  (RYTHMOL ) 225 MG tablet Take 1 tablet (225 mg total) by mouth 3 times a day., Historical Med       tamsulosin  (FLOMAX ) 0.4 mg Cap Take 1 capsule (0.4 mg total) by mouth at bedtime., Historical Med      valsartan  (DIOVAN ) 320 MG tablet Take 1 tablet (320 mg total) by mouth daily., Historical Med           Allergies as of 10/16/2023 - Fully Reviewed 10/16/2023   Allergen Reaction Noted    House dust  08/02/2022     Social: The patient reports that he has never smoked. He has never used smokeless tobacco. He reports that he does not drink alcohol and does not use drugs.    Review of Systems     A comprehensive review of systems was performed and negative except as noted previously in the HPI.    Physical Exam     Vitals:    10/16/23 1026 10/16/23 1228 10/16/23 1425   BP: 151/80  (!) 170/91   BP Location: Left upper arm     Patient Position: Sitting     BP Cuff Size: Regular     Pulse: 81 85 82   Resp: 16 16    Temp: 98 F (36.7 C)     TempSrc: Oral     SpO2: 98% 100% 99%   Weight: (!) 255 lb (115.7 kg)     Height: 6' (1.829 m)         General: Chronically ill-appearing but resting comfortably in no acute distress. Alert, following commands, and responding to questions appropriately.   Head: Normocephalic and atraumatic.  Eyes: Anicteric. Pupils reactive. No discharge from eyes.  ENT: No nasal discharge. Oropharynx is clear. Moist mucus membranes.   Neck: Supple, trachea midline.  Pulmonary: Non-labored breathing. Breath sounds clear to auscultation bilaterally without wheezes or rhonchi.  Cardiac: Normal rate. Regular rhythm. No murmurs.  Abdomen: Soft and flat, non-distended. No tenderness to palpation.   Back: Well-healed midline lumbar incision. He has tenderness to palpation of the left lumbar paraspinal musculature around the area of the left SI joint. No reproducible midline tenderness of the cervical, thoracic, or lumbar spine. No tenderness to the right paraspinal musculature. No CVA tenderness bilaterally.  Musculoskeletal: No long bone deformity.  Skin: Dry, no rashes. No jaundice.   Extremities: Warm  and well perfused. No peripheral edema.  Neuro: A&O x3, answering questions appropriately. No facial asymmetry. Moves all four extremities appropriately to command.  No focal deficit. Was able to ambulate from the EMS stretcher to the hospital stretcher with a steady gait. 5/5 strength with hip flexion, knee flexion/extension, and plantarflexion/dorsiflexion bilaterally. Sensation light touch is grossly intact in all nerve distributions.  Psych: Mood and affect appropriate for situation.    Diagnostic Studies     EKG:  Not obtained    Labs:  Please see EMR for any labs obtained    Radiology:  Please see EMR for any imaging obtained    Emergency Department Procedures   None    ED Course and MDM     [name] is a 83 y.o. male with comorbidities as described above presenting to the emergency department with acute on chronic low back pain with some radicular symptoms. This is in the setting of a recent fall 2 days ago when he is anticoagulated on Eliquis .     On my assessment, he is awake and alert, chronically ill-appearing, resting comfortably in no acute distress. He has tenderness to palpation of the left lumbar paraspinal musculature but no focal tenderness over the midline. He has full strength and sensation of the lower extremities and was able to ambulate here in the emergency department with a steady gait. Given his recent fall, age, and anticoagulation status, I did obtain a CT head which was negative for ICH or mass effect, also obtained a CT of the lumbar spine to rule out any acute fractures in the setting of his recent fall. This showed some degenerative changes but no acute fractures or malalignment. He was given multimodal pain control here in the emergency department. He will be discharged with ongoing outpatient follow-up with his spine clinic and plans for future outpatient MRI. He was given prescriptions for Robaxin  and gabapentin  to continue using for multimodal pain control at home.  Discussed that he could continue over-the-counter Tylenol  and could sparingly use over-the-counter ibuprofen  or Aleve, though we did discuss that he should not be using this daily because he is anticoagulated. He feels comfortable with this plan and was subsequently discharged home with standard return precautions in stable condition.    Medical Decision Making  Problems Addressed:  Chronic left-sided low back pain with left-sided sciatica: complicated acute illness or injury    Amount and/or Complexity of Data Reviewed  Radiology: ordered.    Risk  OTC drugs.  Prescription drug management.        Consults:  None    Summary of Treatment in ED:  Medications   acetaminophen  (TYLENOL ) tablet 975 mg (975 mg Oral Given 10/16/23 1136)   methocarbamoL  (ROBAXIN ) tablet 500 mg (500 mg Oral Given 10/16/23 1136)   ketorolac  (TORADOL ) injection 15 mg (15 mg Intramuscular Given 10/16/23 1227)         Impression     1. Chronic left-sided low back pain with left-sided sciatica        Plan     Risks, benefits, and alternatives were discussed. At this time the patient has been deemed safe for discharge. My customary discharge instructions including strict return precautions for worsening or new symptoms have been communicated.    Follow-Up:  No follow-up provider specified.    Discharge Medications:  Discharge Medication List as of 10/16/2023  2:23 PM        START taking these medications    Details   gabapentin  (NEURONTIN ) 100 MG capsule Take 1 capsule (100 mg total) by mouth 3 times a day., Starting Mon 10/16/2023, Normal, Disp-30 capsule, R-0  methocarbamoL  (ROBAXIN ) 500 MG tablet Take 1 tablet (500 mg total) by mouth 3 times a day as needed., Starting Mon 10/16/2023, Normal, Disp-30 tablet, R-0              Lauraine Medico, MD  [facility]    This note was dictated using voice-recognition software, which occasionally leads to inadvertent typographic errors.       Lauraine Medico, MD  10/16/23 [phone]

## 2023-10-16 NOTE — ED Notes (Signed)
 Returned from CT.

## 2023-10-16 NOTE — Discharge Instructions (Addendum)
 You were seen in the Emergency Department today for back pain.    It is important that you avoid bed rest and continue to complete low-impact physical activity such as walking and gentle stretching. Apply moist heat or ice to the area of pain. You may take Tylenol  and Ibuprofen  as needed for your pain, some people find that alternating these is more effective than only taking one or the other. Do not exceed more than 3 grams of Tylenol  per day.     I am prescribing you the following medications that you can continue to use for pain at home:  - Robaxin  500 mg (muscle relaxer). You can take this up to 3 times per day as needed  - Gabapentin  (medication that helps with nerve pain). You can take this up to 3 times per day as needed. This can make you somewhat sleepy, so do not mix with alcohol or other sedating medications and do not take before driving.    These medications can you make you more at risk for falling at home, so please use caution and use your walker.    4% Lidocaine  patches are available over the counter. You can wear a patch over the site of your worst pain for up to 12 hours, leave the patch off for 12 hours, and replace it with a new patch the following day.     Please return to the Emergency Department if:     - You develop a fever   - You develop a change in your bowel or bladder habits  - You experience weakness or numbness in your legs   - Or if you have any concern you feel warrants further evaluation.    Follow up with your doctor within 1-2 weeks if your symptoms are not improving or are getting worse.

## 2023-10-16 NOTE — ED Notes (Signed)
 Taken to CT.

## 2023-10-16 NOTE — Telephone Encounter (Signed)
 Medication:   Requested Prescriptions     Pending Prescriptions Disp Refills    losartan -hydroCHLOROthiazide  (HYZAAR) 100-25 MG per tablet [Pharmacy Med Name: LOSARTAN  -HCTZ 100-25 MG TABS] 90 tablet 1     Sig: TAKE ONE TABLET BY MOUTH EVERY DAY     Last Filled:  04/06/2023    Last appt: 07/27/2023   Next appt: Visit date not found    Last OARRS:       03/24/2021    12:59 PM   RX Monitoring   Periodic Controlled Substance Monitoring Possible medication side effects, risk of tolerance/dependence & alternative treatments discussed.;No signs of potential drug abuse or diversion identified.

## 2023-11-07 NOTE — Care Coordination-Inpatient (Signed)
 Ambulatory Care Coordination Note     11/07/2023 1:22 PM     Patient outreach attempt by this ACM today to offer care management services. ACM was unable to reach the patient by telephone today;   left voice message requesting a return phone call to this ACM.     ACM: Antuane Eastridge Wanda Clause, RN     Care Summary Note: [facility] - PMH: Afib, CHF, DM2,  HTN - UTRx1    PCP/Specialist follow up:       Follow Up:   Plan for next ACM outreach in approximately 1 week to complete:  - outreach attempt to offer care management services.             Victora Irby Clause BSN, RN     Ambulatory Care Manager    [facility]    Phone: [phone]    [email or web address]

## 2023-11-10 NOTE — Care Coordination-Inpatient (Signed)
 Ambulatory Care Coordination Note     11/10/2023 2:57 PM     Patient outreach attempt by this ACM today to offer care management services. ACM was unable to reach the patient by telephone today;   left voice message requesting a return phone call to this ACM.  mychart message sent requesting patient to contact this ACM.     ACM: Carl Baines Wanda Clause, RN     Care Summary Note: [facility] - PMH: Afib, CHF, DM2,  HTN - UTRx2    PCP/Specialist follow up:       Follow Up:   Plan for next ACM outreach in approximately 1 week to complete:  - outreach attempt to offer care management services.             Carl Blair BSN, RN     Ambulatory Care Manager    [facility]    Phone: [phone]    [email or web address]

## 2023-11-17 NOTE — Care Coordination-Inpatient (Signed)
 Ambulatory Care Coordination Note     11/17/2023 3:46 PM     patient outreach attempt by this ACM today to offer care management services. ACM was unable to reach the patient by telephone today;   left voice message requesting a return phone call to this ACM.     Patient closed (unable to reach patient) from the High Risk Care Management program on 11/17/2023.  No further Ambulatory Care Manager follow up scheduled.       Facundo Allemand Myra BSN, RN     Ambulatory Care Manager    [facility]    Phone: [phone]    [email or web address]

## 2023-11-21 ENCOUNTER — Inpatient Hospital Stay: Admit: 2023-11-21 | Discharge: 2023-11-28 | Payer: Medicare (Managed Care)

## 2023-11-21 DIAGNOSIS — M4306 Spondylolysis, lumbar region: Principal | ICD-10-CM

## 2023-11-21 MED ORDER — gadobutrol (GADAVIST) 1 mmol/1 mL IV solution 10 mL
1 | Freq: Once | INTRAVENOUS | Status: AC | PRN
Start: 2023-11-21 — End: 2023-11-21
  Administered 2023-11-21: 23:00:00 via INTRAVENOUS

## 2023-11-24 DIAGNOSIS — M4306 Spondylolysis, lumbar region: Principal | ICD-10-CM

## 2023-11-27 NOTE — Progress Notes (Unsigned)
 History of Present Illness   HPI: 83 y.o. male presents today for follow up to discuss results of MRI of   the Lumbar Spine.  Patient states that he completed six sessions of physical   therapy, and felt as if the physical therapy made his symptoms worse.  He   states that since his last visit he has fell 2 or 3 times.  He states he was   evaluated in the ER after a fall, and was prescribed gabapentin and a muscle   relaxer.  He states that he started those medications as prescribed, and they   caused him to fall due to dizziness and feeling out of his head.  He states   since his last visit, the back pain has somewhat improved, he continues to   take Tylenol  arthritis for pain.  He states that he continues to experience   weakness in the BLE, with new numbness and tingling along the left lateral   thigh.  He states due to the weakness and concern for falls he has began using   a walker for more stability.  He currently rates his pain a 5/10.  He denies   any red flag symptoms currently.        Review of Histories and Systems   I reviewed the patient's past medical history, social history, family history,   and review of systems. The patient recorded this information in our chart and   I reviewed it personally.      Physical Exam   Constitutional: Pt appears stated age. He is well-developed, well-nourished.   He is A&O x 4, no apparent distress. He is cooperative with the exam with   normal mood and affect.    Cardiovascular: There is no evidence of varicosities, clubbing or edema. Skin   is pink and warm.    Respiratory: There is no evidence of labored breathing.     MSK: Patient is seen ambulating with use of walker. Palpation of the lumbar   spine does not produce any tenderness. There is no palpable step off. Positive   facet loading maneuvers bilaterally. Muscle tone of the lower extremities and   lumbar spine is normal. Knee and hip joints are stable bilaterally without   any evidence of subluxation, pain  or crepitus.    Neuro: Patellar and achilles reflexes are 1+. SLR is negative bilaterally.   Sensation of the lower extremities is normal, with exception to decrease   sensation at the left L3 dermatome. Muscular strength is as described below:    LLE: HF: 5/5, KE:4-/5, KF: 5/5, PF: 5/5, DF: 5/5  RLE: HF: 5/5, KE:4-/5, KF: 5/5, PF: 5/5, DF: 5/5      Review of Testing   Review of Testing:   MRI of the Lumbar Spine completed on 11/21/2023:  Alignment: Subtle retrolisthesis less than 3 mm at L4-5 similar to prior   studies.  Osseous structures: The lumbar discs are desiccated with mild multilevel disc   height loss and moderate osteophytes. Postoperative findings of partial L3   spinous process resection and partial laminectomy at L3-4. Normal marrow   signal.   Postcontrast imaging: No abnormal intrathecal, marrow, or paravertebral   enhancement.   Distal thoracic cord and conus: Normal.  Individual disc space levels:  T11-12: Minimally prominent epidural fat causes no thecal sac narrowing or   foraminal compromise. Minimal left facet arthrosis.  T12-L1: Minimal noncompressive diffuse disc bulge and prominent epidural fat   with patent  foramina.  L1-2: Minimal noncompressive diffuse disc bulge and prominent epidural fat   with patent foramina.  L2-3: Diffuse disc bulge and facet arthrosis with prominent epidural fat   contributing to moderate to severe thecal sac narrowing with AP dimension of 5   mm, with crowding of nerve roots in the thecal sac and bilateral subarticular   narrowing causing borderline right and no significant left L3 subarticular   nerve root compression on series 5 image 19. Hypertrophic changes and   foraminal disc protrusions contribute to mild right and no significant left L2   foraminal narrowing. Partial annular fissure at this level with a tiny   inferiorly extending central disc extrusion.  L3-4: Laminectomy with no compressive abnormality of the thecal sac.   Hypertrophic changes and  foraminal disc protrusion components cause moderate   left and mild right L3 foraminal narrowing.  L4-5: Diffuse disc bulge and facet arthrosis with ligamentum flavum thickening   causing bilateral subarticular narrowing with moderate right and mild left L5   subarticular nerve root compression series 5 image 30. Hypertrophic changes   and foraminal disc protrusions contribute to mild to moderate left and mild   right L4 foraminal narrowing.  L5-S1: Hypoplastic facet joints right gr    Assessment   Impression: 83 y.o. male with spinal stenosis, lumbar radiculopathy and facet   arthropathy.   MRI of the lumbar spine reviewed with diffuse disc bulge, facet arthrosis and   epidural fat causing moderate to severe thecal sac narrowing at L2-3, and   moderate left and mild right L3 foraminal narrowing at L3-4    Plan   Additional Plan Details:   Previous conservative measures have failed.  Feel patient would benefit from   [facility].  Will plan for left L2, L3 TF ESI.  Risks and benefits of the procedure   discussed today with the patient.  He verbalized understanding.  Patient is   currently on Eliquis  due to atrial fibrillation.  Patient educated that he   will need to hold the Eliquis  3 days prior to the [facility].  We will reach out to   his cardiologist Dr. Woodie for clearance to hold the Eliquis .  Patient   verbalized understanding and is agreeable to the plan.  Patient to call with   any new or worsening symptoms or neurological changes.  Follow-up 2 weeks   after ESI.           Electronically Signed by Asberry Backer CNP on 11/27/2023 at 12:27 PM  ________________________________________________________________________

## 2023-12-12 ENCOUNTER — Ambulatory Visit: Payer: Medicare (Managed Care) | Attending: Acute Care

## 2023-12-12 MED ORDER — TRADJENTA 5 MG PO TABS
5 | ORAL_TABLET | Freq: Every day | ORAL | 1 refills | Status: AC
Start: 2023-12-12 — End: ?

## 2023-12-12 NOTE — Progress Notes (Unsigned)
 NAME: [name]  MRN: [id number]  Author:Oreste Majeed ANNETTE Minetta Krisher, CNP  Date:12/12/2023    Last seen 08/02/2022 by Myself    CHIEF COMPLAINT:   83 y.o. 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  He is using the ASV machine nightly. His average nightly use is 6 hours and 58 minutes with an AHI of 12.1. He adjusted the full mask to below the chin after the last visit. I kept hearing air leaks and after I moved it down, I don't hear leaks anymore. + Significant air leak present on the download.  He is not having any problems receiving equipment as needed. +tolerating the pressure well.  No reports of snoring with PAP treatment.  He has improved sleep comfort and daytime alertness with therapy.    Bedtime: 11 PM  SLT: 15  minutes  Awakenings during the night: 1-2 to use the restroom  Wake up: 8 AM   Naps: Yes 60 minutes/daily    Mood is good    No other sleep concerns.    Weight stable.     PAP Data Download:  Equipment:  ResMed -  min EPAP 6, max EPAP 15, min pressure support 4, Max pressure support 15 cwp   Mask: Full  No chinstrap  Equipment download:   Data date range:07/05/2023-08/03/2023  Downloaded data shows that over 30 nights the patient has used the device 30 nights.   The average use on nights used = 6 hours 58 minutes.  The percentage of nights used more than 4 hours =100%.  The machine reported AHI is 12.1.  95th percentile leak was 118.4 L/minute. IPAP Pressure: Median: 11.6, 95th percentile:19.4 and Maximum:20.9. EPAP Pressure: Median:6, 95th percentile:6.2 and Maximum:6.3  Magnetic clasps on headgear:  No  Medical device or implants:    Safety notification discussed:      Uses a so clean    Prior Diagnostic Testing:    [facility]AP [facility]  Date of CPAP/BPAP (04188): 03/09/17  CmH2O Pressure prescribed: 7/7/0/15/25  AHI at  Prescribed:4.6  Lowest SpO2% at Prescribed:83%  PLMI:     PLMAI:  Weight: 257 pounds  Other:    Date of CPAP: 12/06/2008   SE %: 76.5   AHI: 1.1   Lowest p ox %: 90.0   CPAP performed at: [facility] @ time of study: 265 lbs   DME Company: [employer]   Mask type: full face   cmH2O: 13     Date of NPSG: 05/27/2003   SE %: 63.5   AHI: 52   Lowest p ox %: 73   NPSG performed at: [facility]     Wt Readings from Last 3 Encounters:   11/21/23 (!) 255 lb (115.7 kg)   10/16/23 (!) 255 lb (115.7 kg)   08/08/23 (!) 255 lb (115.7 kg)       PAST MEDICAL HISTORY:  Surgical history:  Past Surgical History:   Procedure Laterality Date    BACK SURGERY  2022    COLONOSCOPY W/ POLYPECTOMY      benign  has at 3 plus procedures    HERNIA REPAIR  12/2007    right inguinal, mini maze    HERNIA REPAIR  05/2008    herniated lungs bilateral (repaired with mesh)    mini-maze  corrected his A-fib    rt eye  02/2018    SEPTOPLASTY      UVULOPALATOPHARYNGOPLASTY      UVULOPALATOPHARYNGOPLASTY      has had esophageal dilations in the past (Dr.Decter)    WISDOM TOOTH EXTRACTION         Medical history:  Past Medical History:   Diagnosis Date    Atrial fibrillation (CMS-HCC)     corrected with Mini-maze procedure    Cataract     left eye    Diabetes mellitus (CMS-HCC)     type 2    Dilated cardiomyopathy (CMS-HCC)     Hearing loss     left ear    Hypercholesteremia     Hypertension     Sleep apnea     Thyroid disease     hypothyroidism       Medications:  Current Outpatient Medications   Medication Sig    apixaban  Take 1 tablet (5 mg total) by mouth 2 times a day.    aspirin  Take 1 tablet (81 mg total) by mouth daily.    atorvastatin  Take 1 tablet (10 mg total) by mouth at bedtime.    carvediloL  Take 1 tablet (25 mg total) by mouth 2 times a day.    ferrous sulfate  Take 1 tablet (325 mg total) by mouth daily with breakfast.    furosemide  Take 1 tablet (20 mg total) by mouth daily.    gabapentin  Take 1 capsule (100 mg total) by mouth 3 times a day.     glimepiride Take 1 tablet by mouth  every morning before  breakfast    GLUC/CHON-MSM#1/C/MANG/BOS/BOR (OSTEO BI-FLEX ORAL) Take by mouth. OSTEO BI-FLEX JOINT SHIELD TABS     glyBURIDE -metformin  Take 2 tablets by mouth 2 times a day.    levothyroxine  Take 1 tablet (100 mcg total) by mouth every morning.    methocarbamoL  Take 1 tablet (500 mg total) by mouth 3 times a day as needed.    omeprazole Take 1 capsule (40 mg total) by mouth every morning.    propafenone  Take 1 tablet (225 mg total) by mouth 3 times a day.    tamsulosin  Take 1 capsule (0.4 mg total) by mouth at bedtime.    valsartan  Take 1 tablet (320 mg total) by mouth daily.     No current facility-administered medications for this visit.         Allergies:House dust    Family history:  Family History   Problem Relation Age of Onset    Diabetes Mother         type 2    Hypertension Mother     Coronary artery disease Sister     Diabetes Sister         type 2    Coronary artery disease Sister     Coronary artery disease Brother     Hypertension Brother     Diabetes Brother         type 2    Hypertension Brother        Social history:  Social History     Socioeconomic History    Marital status: Married     Spouse name: Not on file    Number of children: Not on file    Years of education: Not on file    Highest education level: Not on file   Occupational History    Not on file   Tobacco Use    Smoking status: Never  Smokeless tobacco: Never    Tobacco comments:     03/19/07   Substance and Sexual Activity    Alcohol use: No    Drug use: No     Comment: 04-14-11    Sexual activity: Not on file   Other Topics Concern    Caffeine Use No    Occupational Exposure Yes     Comment: retired Energy manager    Exercise Yes    Seat Belt Yes   Social History Narrative    Not on file     Social Drivers of Health     Financial Resource Strain: Low Risk  (04/20/2022)    Received from [facility]    Overall Financial Resource Strain (CARDIA)      Difficulty of Paying Living Expenses: Not hard at all   Food Insecurity: No Food Insecurity (08/08/2023)    Yearly Questionnaire     Do you need any assistance with obtaining housing, meals, medication, transportation or medical equipment?: No     Assistance needed for:: Not on file   Transportation Needs: No Transportation Needs (08/08/2023)    Yearly Questionnaire     Do you need any assistance with obtaining housing, meals, medication, transportation or medical equipment?: No     Assistance needed for:: Not on file   Physical Activity: Inactive (10/17/2022)    Received from [facility]    Exercise Vital Sign     Days of Exercise per Week: 0 days     Minutes of Exercise per Session: 0 min   Stress: No Stress Concern Present (01/01/2021)    Received from [facility]    [employer] of Occupational Health - Occupational Stress Questionnaire     Feeling of Stress : Not at all   Social Connections: Moderately Isolated (01/01/2021)    Received from [facility]    Social Connection and Isolation Panel [NHANES]     Frequency of Communication with Friends and Family: Three times a week     Frequency of Social Gatherings with Friends and Family: Three times a week     Attends Religious Services: Never     Active Member of Clubs or Organizations: No     Attends Banker Meetings: Never     Marital Status: Married   Catering manager Violence: Not on file   Housing Stability: Low Risk  (08/08/2023)    Yearly Questionnaire     Do you need any assistance with obtaining housing, meals, medication, transportation or medical equipment?: No     Assistance needed for:: Not on file       Review of Systems    I have reviewed the review of system and advised patient to contact primary care physician or specialist for non sleep related issues.    ROS  has been reviewed and updated with the patient during this visit.    PHYSICAL EXAMINATION:  There were no vitals  taken for this visit.  GENERAL:  Well developed well-nourished male +obese There is no height or weight on file to calculate BMI. in NAD and good historian.  SKIN: No obvious rashes. Warm and dry.  HEAD: Normocephalic and atraumatic. +Facial hair.   EYES:    Pupils equal, reactive and conjugate                 Sclera are nonicteric  Ears:  Ears appear normal externally bilateral  Throat: Thyroid is not palpable           Cervical Lymphadenopathy not present            Mallampati-IV. Oropharynx is moist and pink.   Lungs:  Clear, without wheezes rales or rhonchi. No stridor. No respiratory distress. Chest wall normal  CV:  Normal S1 S2, Regular rate and rhythm. No murmurs, rubs or gallops.    MS:  Without cyanosis or clubbing. No peripheral edema   NEURO:  Alert and oriented; Normal gait   PSYCH:  Well groomed, calm, pleasant and appropriate. Insight and judgment appear good    LABORATORY TESTS:    Lab Results   Component Value Date    IRON  16 (L) 05/24/2019    TIBC 390 05/24/2019     Lab Results   Component Value Date    TSH 3.63 11/09/2019    FREET4 1.21 11/09/2019     Lab Results   Component Value Date    WBC 11.2 (H) 10/25/2021    HGB 11.9 (L) 10/25/2021    HCT 36.9 (L) 10/25/2021    MCV 83.8 10/25/2021    PLT 156 10/25/2021     No components found for: HBA1C  Lab Results   Component Value Date    GLUCOSE 238 (H) 10/25/2021    BUN 17 10/25/2021    CO2 28 10/25/2021    K 4.2 10/25/2021    NA 135 10/25/2021    CL 99 10/25/2021    CALCIUM 9.0 10/25/2021     No components found for: LASTABGRESULT     Lab results reviewed.  Patient will follow-up with ordering provider for abnormal labs.      DEWAINE Devora Fret                        Department of Cardiology*                          [address]                         [place], [place] [address]                              [phone]     Transthoracic Echocardiography     Patient:    Nate, Common   MR #:       [id number]   Account:   Study Date:  11/11/2019   Gender:     M   Age:        10   DOB:        November 15, 1940   Room:       [facility]      REFERRING    Rubin,    PERFORMING   [facility]    SONOGRAPHER  Nathanel Bring RDCS, RVT    ORDERING     Rubin, Marissa [facility]    [place], [place] [facility]   Leodis Raddle, Urban Ray    ATTENDING    Tancred, Marissa Aleck    ADMITTING    Rubin Daved Aleck     -------------------------------------------------------------------     Procedure:ECHO 2D COMPLETE W/CONTRAST (TTE)            Order: Accession Number:[id number]     -------------------------------------------------------------------  Indications:    Shortness of breath R06.02.     -------------------------------------------------------------------   PMH:  Chest pain.  Atrial fibrillation.  Congestive heart failure.    Risk factors:  Hypertension.     -------------------------------------------------------------------   Study data:  Height: 72in. 182.9cm. Weight: 254.5lb. 115.7kg.   Study status:  Routine.  Procedure:  Transthoracic   echocardiography. Image quality was good. Scanning was performed   from the parasternal, apical, and subcostal acoustic windows.   Intravenous contrast (Optison ) 6 ml was administered.   Transthoracic echocardiography.  M-mode, complete 2D, complete   spectral Doppler, and color Doppler.  Birthdate:  Patient   birthdate: October 24, 1940.  Age:  Patient is 83yr old.  Sex:  Birth   gender: male.  Body mass index:  BMI: 34.6kg/m^2.  Body surface   area:    BSA: 2.28m^2.  Blood pressure:     152/80  Patient status:    Inpatient.  Study date:  Study date: 11/11/2019. Study time: 10:54   AM.  Location:  Echo laboratory.     -------------------------------------------------------------------   Study Conclusions     - Left ventricle: The cavity size is normal. Wall thickness was increased increased in a pattern of     mild to moderate LVH. Systolic function was normal. The  estimated ejection fraction was in the     range of 55% to 60%. Wall motion was normal; there were no regional wall motion abnormalities.     Abnormal relaxation with increased filling pressures.   - Right ventricle: Systolic function was normal. TAPSE: 1.8cm.Tricuspid annular systolic velocity:     9.5cm/s.     Last echo results reviewed.    EPWORTH SLEEPINESS SCALE: (at or above 10 is abnormal and 24 is the maximum score)      06/19/2017     4:00 PM 04/23/2018     3:00 PM 04/25/2019     3:40 PM 06/02/2020    10:53 AM 06/30/2021    11:02 AM 08/02/2022     2:33 PM 08/08/2023     2:11 PM   Epworth Sleepiness Scale   Sitting and reading 1  1  0  1 1 2 1    Watching TV 1  1  0  1 2 2 3    Sitting, inactive in a public place (e.g. a theatre or a meeting) 1  0  0  1 0 0 1   As a passenger in a car for an hour without a break 0  0  0  0 1 0 1   Lying down to rest in the afternoon when circumstances permit 1  1  3  1 2 2 3    Sitting and talking to someone 0  0  0  1 0 0 0   Sitting quietly after a lunch without alcohol 0  0  0  1 0 1 0   In a car, while stopped for a few minutes in traffic 0  0  0  0 0 0 0   Total score 4 3 3 6 6 7 9        Data saved with a previous flowsheet row definition       Assessment    Assessment/plan:  1. 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  Efficacy data reviewed and discussed with patient.  AHI control is suboptimal.  Large air leaks and elevated AHI present.  Has adjusted the way the mask fits since  his last visit.  He is using the device all hours of sleep.  His nightly average use time is nearly 7 hours over the last 30 days.    Proper mask fit reviewed during the visit.  Pt agrees to have a mask fitting with supply company.     Disposable Pap equipment order updated.  He will contact the supply company as needed.    Cardiovascular health risks associated with OSA reviewed.  He was encouraged to continue excellent compliance.    Proper handwashing cleaning  information provided on discharge summary.    2. Obesity  There is no height or weight on file to calculate BMI.  Weight stable over the last year.    Exercise as tolerated encouraged.    History of diabetes mellitus, hearing loss, hypertension, atrial fibrillation, cataracts, dilated cardiomyopathy, hypercholesterolemia and thyroid disease.    Follow-up appointment:Return visit in 4 months.         A copy of my Plan and Assessment will be sent to: [facility]    Medical Decision Making:  The following items were considered in medical decision making:  Review / order radiology tests  Review / order other diagnostic tests/interventions    Electronically signed by DARICE FASTER Chavy Avera, CNP 12/10/2023 11:21 PM    There is no height or weight on file to calculate BMI.  BP Readings from Last 3 Encounters:   10/16/23 (!) 170/91   08/08/23 118/76   08/02/22 134/82       SLEEP QUALITY MEASURES:   Obstructive Sleep Apnea Follow Up Visit:   Adherence to therapy documented at least annually?: Yes    Reassessment of excessive daytime sleepiness?: Yes    Weight / body mass index assessment at visit?: Yes    If body mass index greater than 25 was discussion of weight management discussed? : Yes    Blood pressure assessed at visit? : Yes          *This note was transcribed with computerized voice recognition technology.  This note has been reviewed for accuracy however inadvertent transcription errors may be present.    This note was completely edited, written and reviewed by me and consists of information cut and pasted from the my most recent visit, my smart phrases and other Epic tools. I have personally reviewed all aspects of this note to at least include reviewing this patient's chart and problem list, updating the history, physical exam, lab and procedure results, and assessment and plan as detailed above and below.  As such this visit note reflects my current evaluation and management for this patient.

## 2023-12-26 MED ORDER — ELIQUIS 5 MG PO TABS
5 | ORAL_TABLET | Freq: Two times a day (BID) | ORAL | 0 refills | 30.00000 days | Status: DC
Start: 2023-12-26 — End: 2024-03-05

## 2023-12-26 MED ORDER — OMEPRAZOLE 40 MG PO CPDR
40 | ORAL_CAPSULE | Freq: Every day | ORAL | 1 refills | 90.00000 days | Status: DC
Start: 2023-12-26 — End: 2024-03-05

## 2023-12-26 NOTE — Telephone Encounter (Signed)
 Medication:   Requested Prescriptions     Pending Prescriptions Disp Refills    ELIQUIS  5 MG TABS tablet [Pharmacy Med Name: ELIQUIS  ORAL TAB 5 MG] 180 tablet 0     Sig: TAKE ONE TABLET BY MOUTH TWICE A DAY     Last filled: 07/25/2023  Last appt: 07/27/2023   Next appt: Visit date not found    Last OARRS:       03/24/2021    12:59 PM   RX Monitoring   Periodic Controlled Substance Monitoring Possible medication side effects, risk of tolerance/dependence & alternative treatments discussed.;No signs of potential drug abuse or diversion identified.        Data saved with a previous flowsheet row definition

## 2023-12-26 NOTE — Telephone Encounter (Signed)
 Medication:   Requested Prescriptions     Pending Prescriptions Disp Refills    omeprazole  (PRILOSEC) 40 MG delayed release capsule [Pharmacy Med Name: OMEPRAZOLE  40MG  CPDR] 90 capsule 0     Sig: TAKE ONE CAPSULE BY MOUTH EVERY DAY     Last filled: 09/15/2023  Last appt: 07/27/2023   Next appt:     Last OARRS:       03/24/2021    12:59 PM   RX Monitoring   Periodic Controlled Substance Monitoring Possible medication side effects, risk of tolerance/dependence & alternative treatments discussed.;No signs of potential drug abuse or diversion identified.        Data saved with a previous flowsheet row definition

## 2023-12-26 NOTE — Other (Unsigned)
 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  Patient denies chest discomfort, dizziness, edema, palpitations,   SOB.    Has dyspnea on exertion - limited to 140 feet.    No cardiac complaints. Unaware of any symptoms relating to atrial   fibrillation.    Has had anemic, with hypochromic microcytic indices. EGD - minor lesions,  colonoscopy - normal.    Has gained weight. Cannot exercise due to back pain.    Has undergone ablation for atrial fibrillation. No bleeding complications.  Underwent, immediate post-ablation - cardioversion (09/03/2019).    Recently, admitted to [facility] (11/11/2019) with CHF. Diuresed for 16 lbs of fluids.  Echo - performed that admission - normal ventricular and valvular function.   Has  been experiencing leg cramps, while on Lasix .    Ambulatory BP's - 120's - 130's/70's - 80's.     PastCardiac History:  * Atrial fibrillation  * Hypertension benign  * Hyperlipidemia  * CHF  * Chest pain  * Sleep apnea, obstructive  * Coronary artery disease    Past Medical History:  Diagnosis Date   Acute diastolic congestive heart failure (HC CODE) 11/11/2019   Chest pain   Chronic low back pain   Coronary artery disease   40% L Cx   COVID-19 03/2021   COVID-19 vaccine series completed 06/2019   Pfizer; 06/16/19, 07/07/19   Diabetes (HC CODE)   Esophageal reflux   Essential hypertension   Heart failure (HC CODE)   Hiatal hernia   Hypothyroidism   Iron deficiency anemia due to chronic blood loss   Lumbar spine pain   With cyst    Malignancies (HC CODE)   skin cancer   Methicillin resistant Staphylococcus aureus infection   mini Maze surgery,    Mixed hyperlipidemia   Myalgia   Obesity   Other gastritis with bleeding 06/11/2019   Paroxysmal atrial fibrillation (HC CODE)   Paroxysmal   Sleep apnea   CPAP    Past Surgical History:  Procedure Laterality Date   Cardioversion N/A 11/08/2019   CARDIOVERSION performed by Charlanne Cruise, MD at [facility] CATH LAB   Carpal Tunnel Release Bilateral   Colonoscopy  06/11/2019   Reportedly, normal   ECHO Historical  04/19/2017   Devora Fret; hx of sleep apnea - report in care everywhere   ELECTROPHYSIOLOGY STUDY-ABLATION-ATRIAL FIBRILLATION N/A 09/03/2019   ELECTROPHYSIOLOGY STUDY-ABLATION-ATRIAL FIBRILLATION performed by Charlanne Cruise, MD at [facility] CATH LAB   END EGD  06/11/2019   Patchy gastritis - s/p cautery   EXTRACAPSULAR CATARACT EXTRACTION,INTRAOCULAR LENS Left   OTHER   Mini-Maze for atrial fibrillation   OTHER Bilateral   Repair of herniation thro' chest wall - as a complication of mini-Maze   OTHER Right   Resection of calcaneal spur   Septoplasty   SPINE SURGERY  03/2021   Resection of cyst from lower back   Tonsillectomy  Adenoidectomy   Transesophageal ECHO (TEE)-Cardioversion N/A 05/30/2016   TRANSESOPHAGEAL ECHO (TEE)-CARDIOVERSION performed by Belynda CHRISTELLA Kinsman, MD at   [facility]  CATH LAB   UVULOPHARYNGOPALATOPLASTY (UPPP)    Outpatient Encounter Medications as of 12/26/2023  Medication Sig Dispense Refill   TRADJENTA  5 mg tablet Take 5 mg by mouth in the morning.   propafenone  (RYTHMOL ) 225 mg tablet TAKE ONE TABLET BY MOUTH EVERY 8 HOURS   270  Tab 3   valsartan  (DIOVAN ) 320 mg tablet TAKE 1 TABLET DAILY 90 Tab 3   apixaban  (ELIQUIS ) 5 mg tablet Take 1 Tab by mouth two times a day 180 Tab 3   ferrous sulfate (FEOSOL) 325 mg (65 mg iron) tablet Take 325 mg by mouth in   the  morning and 325 mg before bedtime.   metFORMIN  (GLUMETZA ) 500 mg TG-tablet 24 hr Take 500 mg by mouth in the   morning  and 500 mg  in the evening. Take before meals.   atorvastatin  (LIPITOR ) 10 mg tablet Take 10 mg by mouth at bedtime.   carvedilol  (COREG ) 25 mg tablet Take 25 mg by mouth in the morning and 25 mg  before bedtime.   levothyroxine  (SYNTHROID ) 100 mcg tablet Take 1 Tab by mouth daily before  breakfast.   GLUCOSAMINE HCL/CHONDR SU A NA (OSTEO BI-FLEX ORAL) Take 1 Tab by mouth daily  at 6pm.   omeprazole  (PRILOSEC) 40 mg DR-capsule Take 40 mg by mouth in the morning.   glimepiride  (AMARYL ) 2 mg tablet Take 2 mg by mouth in the morning and 2 mg  before bedtime.   [DISCONTINUED] sitaGLIPtin  (JANUVIA ) 100 mg tablet Take 100 mg by mouth   daily.  (Patient not taking: Reported on 12/26/2023)    No facility-administered encounter medications on file as of 12/26/2023.    Family History  Problem Relation Name Age of Onset   Heart attack Mother   Heart attack Father   Diabetes Sister   Heart Disease Brother   Anesthesia Problems Neg Hx    Social History    Socioeconomic History   Marital status: Widowed    Spouse name: Not on file   Number of children: 0   Years of education: 67   Highest education level: 12th grade  Occupational History   Occupation: Location manager    Comment: [employer] - Retired  Tobacco Use   Smoking status: Never   Smokeless tobacco: Never  Vaping Use   Vaping status: Never Used  Substance and Sexual Activity   Alcohol use: No   Drug use: No   Sexual activity: Not on file  Other Topics Concern   Not on file  Social History Narrative   Not on file    Social Drivers of Health    Financial Resource Strain: Low Risk  (04/20/2022)   Received from [facility]   Overall Financial Resource Strain (CARDIA)    Difficulty of Paying Living Expenses: Not hard at all  Food Insecurity: No Food Insecurity (08/08/2023)   Received from [facility]   Yearly Questionnaire    Do you need any assistance with obtaining housing, meals, medication,  transportation or medical equipment?: No    Assistance needed for:: Not on  file  Transportation Needs: No Transportation Needs (08/08/2023)   Received from [facility]   Yearly Questionnaire    Do you need any assistance with obtaining housing, meals, medication,  transportation or medical equipment?: No    Assistance needed for:: Not  on file  Physical Activity: Inactive (10/17/2022)   Received from [facility]   Exercise Vital Sign    On average, how many days per week do you engage in moderate to strenuous  exercise (like a brisk walk)?: 0 days    On average, how many minutes do you engage in exercise at this level?: 0 min  Stress: No Stress Concern Present (01/01/2021)   Received from [facility]   [employer] of Occupational Health - Occupational Stress Questionnaire    Feeling of Stress : Not at all  Social Connections: Moderately Isolated (01/01/2021)   Received from [facility]   Social Connection and Isolation Panel    In a typical week, how many times do you talk on the phone with family,  friends, or neighbors?: Three times a week    How often do you get together with friends or relatives?: Three times a week    How often do you attend church or religious services?: Never    Do you belong to any clubs or organizations such as church groups, unions,  fraternal or athletic groups, or school groups?: No    How often do you attend meetings of the clubs or organizations you belong   to?:  Never    Are you married, widowed, divorced, separated, never married, or living with   a  partner?: Married  Intimate Partner Violence: Not on file  Housing Stability: Low Risk  (08/08/2023)   Received from [facility]   Yearly Questionnaire    Do you need any assistance with obtaining housing, meals, medication,  transportation or medical equipment?: No    Assistance needed for:: Not on file    Cardiovascular Risk Factors:  HTN  HPL    Fourteen-point review of systems is done and is otherwise negative except as  noted in the  chart.  Review of Systems  Constitutional: Negative for chills and fever.  HENT:  Negative for ear pain and sore throat.  Eyes:  Negative for pain and visual disturbance.  Cardiovascular:  Positive for dyspnea on exertion. Negative for chest pain,   leg  swelling and palpitations.  Respiratory:  Negative for cough, shortness of breath and wheezing.  Skin:  Negative for color change and rash.  Musculoskeletal:  Positive for muscle cramps and muscle weakness. Negative for  back pain and myalgias.  Gastrointestinal:  Negative for abdominal pain, nausea and vomiting.  Genitourinary:  Negative for dysuria and hematuria.  Neurological:  Negative for dizziness, light-headedness, seizures and tremors.  Allergic/Immunologic: Negative.    Vital Signs:  Ht: 1.803 m (5' 11) Wt: 111.6 kg (246 lb)  BMI: Body mass index is 34.31   kg/m??.  P: 72, Rhythm: Regular  BP: 124/68 Position: Sitting  Weight management and counseling:  Estimated body mass index is 34.31 kg/m?? as calculated from the following:    Height as of this encounter: 1.803 m (5' 11).    Weight as of this encounter: 111.6 kg (246 lb)..  CMS Normal Parameters: Age 71 years and older BMI =>18.5 and <25kg/m2  This was discussed with him.  Body mass index is 34.31 kg/m??.    Physical Exam  Vitals reviewed.  Constitutional:     Appearance: He is well-developed.  HENT:     Head: Normocephalic and atraumatic.  Eyes:     Pupils: Pupils are equal, round, and reactive to light.  Cardiovascular:     Rate and  Rhythm: Normal rate and regular rhythm.  Pulmonary:     Effort: Pulmonary effort is normal.     Breath sounds: Normal breath sounds.  Abdominal:     General: Bowel sounds are normal.     Palpations: Abdomen is soft.  Musculoskeletal:     General: Normal range of motion.     Cervical back: Normal range of motion and neck supple.  Skin:     General: Skin is warm and dry.  Neurological:     Mental Status: He is alert and oriented to person, place, and time.  Psychiatric:      Behavior: Behavior normal.     Judgment: Judgment normal.    Cardiac tests:  ECG - 12/26/2023  Atrial fibrillation - controlled ventricular response.    ECG - 04/27/2022  Baseline artifact may preclude accurate interpretation.  Normal sinus rhythm.  1st degree AV block.    ECG - 02/15/2021  Atrial fibrillation with controlled ventricular response.  No acute ischemic changes.    ECG - 11/27/2019  Normal sinus rhythm, alternating 6 beats of atrial fibrillation    Holter 12/09/2019  Normal sinus rhythm at an average heart rate of approx 79 bpm (range: 57 - 142  bpm).  Frequent supraventricular ectopy - isolated PAC's, atrial couplets (13.67%).  Short runs of p SVT - longest - 31 beats, fastest - 117 bpm.  Rare, isolated PVC's (0.15%).  No ST segment changes seen.  Symptom of shortness of breath associated with NSR, no ectopy    Echo - 11/11/2019  Left ventricle: The cavity size is normal. Wall thickness was increased  increased in a pattern of   mild to moderate LVH. Systolic function was   normal.  The estimated ejection fraction was in the range of 55% to 60%. Wall motion   was  normal; there were no regional wall motion abnormalities.    Abnormal relaxation with increased filling pressures.  - Right ventricle: Systolic function was normal. TAPSE: 1.8cm.Tricuspid   annular  systolic velocity: 9.5cm/s.    Echo - 02/18/16  - Left ventricle: The cavity size was normal. Wall thickness was    normal. Systolic function was normal. The estimated ejection    fraction was in the range of 60% to 65%. Wall motion was normal;    there were no regional wall motion abnormalities.  - Mitral valve: Mild regurgitation.  - Left atrium: The atrium was mildly dilated.  - Right ventricle: Systolic function was normal.    Holter monitor: 02/11/16  NSR, frequent supraventricular arrhythmias, very rare isolated  PVC's.  No pauses greater than 2 seconds were recorded    NST -  Normal coronary perfusion, Normal LV/RV function - LVEF - 53%.    FLP  - 07/27/2023 (11/13/2020, 05/07/2019, 09/01/17)  TC - 135 (114, 96, 124), HDL - 32 (24, 30, 31), LDL - 85 (70, 51, 76), TG - 91  (98, 73, 83)    Impression:  1) Coronary artery disease - Mild - 40% stenosis in the right coronary artery.  Cont with aggressive risk factor modification.    2) Hypertension benign - Better controlled, aided by his weight loss. Cont   Coreg   - 25 mg bid, Valsartan  320 mg/day.    3) Hyperlipidemia - Controlled, except low HDL. Cont Atorvastatin  - 10 mg/day   +  aggressive lifestyle modification, comprising of dietary  restriction/modification and exercises, with a goal towards weight reduction   to  his ideal body weight.  4) Atrial fibrillation, hx of paroxysmal, now, prob. permanent - Recurrent,  despite antiarrhythmic Rx. Asymptomatic. S/p mini-Maze procedure. S/p RFA  (08/2019). Cont Coreg  - 25 mg bid. DC Propafenone .    5) Sleep apnea, obstructive - Encouraged to use CPAP.    6) Chest pain - Nonanginal.  Highly unusual situation. Had herniation of the  lung parenchyma thro' the rent left in his bilateral anterior chest following  Mini-Maze procedure for parox. atrial fibrillation. S/p repair of hernia by   Dr.  Gala.    7) Statin induced myalgias and myopathy - Resolved since DC'ed. Elevated CPK   on  Pravastatin  - DC'ed.  Now resumed back on Atorvastatin .  Appears to be  tolerating this better. Cont with aggressive risk factor modification.    8) Obesity - Counseled on dietary restriction/modification and exercises, with  goal towards weight reduction to his ideal body weight. Has started exercises  and following Nutrisystem diet plan.    9) Anemia - Hypochromic, microcytic. EGD - minor gastric lesions (report  unavailable) - cauterized. Normal colonoscopy GREEN Letters).    10) Preoperative examination - May proceed with anticipated epidural   injections.    Meaningful Use:      Coronary Artery Disease  Assessment   CCS angina class: no angina   There is dyspnea on exertion which may be  an anginal equivalent    Plan    Continue current program    Atrial Fibrillation and Atrial Flutter  Assessment   The patient has atrial fibrillation-initial episode   The patient's CHA2DS2 VASc score is 3   A CHA2DS2 VASc score of 3 is associated with a yearly stroke rate of 3.2% off  warfarin.   The patient's current rhythm is normal   S/p mini-Maze.    Hyperlipidemia  Assessment   The patient's HDL goal is >40 mg/dL   The patient's LDL goal is 100 mg/dL or less   The patient is close to goal on current medications    Plan   Continue current therapy   Goals of therapy were discussed with the patient today   Lifestyle management was discussed with the patient today including adhering   to  a healthy diet, regular exercise, avoidance of tobacco products and   maintenance  of a healthy weight.    Hypertension  Assessment    BP currently controlled      Plan    Continue current hypertension plan    Adjust medications    It has been a pleasure being able to participate in Mr. Farooqui care. Please  do not hesitate to contact me if I may be of any further assistance.    Sincerely,    Radonna Downing, MD  [phone] (Answering service)

## 2024-01-18 MED ORDER — METFORMIN HCL 500 MG PO TABS
500 | ORAL_TABLET | Freq: Two times a day (BID) | ORAL | 1 refills | Status: AC
Start: 2024-01-18 — End: ?

## 2024-01-18 NOTE — Telephone Encounter (Signed)
"  Medication:   Requested Prescriptions     Pending Prescriptions Disp Refills    metFORMIN  (GLUCOPHAGE ) 500 MG tablet [Pharmacy Med Name: METFORMIN  HCL 500MG  TABS] 360 tablet 1     Sig: TAKE TWO TABLETS BY MOUTH TWICE A DAY WITH MEALS     Last Filled:  08/11/2023    Last appt: 07/27/2023   Next appt: Visit date not found    Last OARRS:       03/24/2021    12:59 PM   RX Monitoring   Periodic Controlled Substance Monitoring Possible medication side effects, risk of tolerance/dependence & alternative treatments discussed.;No signs of potential drug abuse or diversion identified.        Data saved with a previous flowsheet row definition     "

## 2024-01-19 MED ORDER — ATORVASTATIN CALCIUM 10 MG PO TABS
10 | ORAL_TABLET | Freq: Every day | ORAL | 1 refills | 90.00000 days | Status: AC
Start: 2024-01-19 — End: ?

## 2024-01-19 NOTE — Telephone Encounter (Signed)
"  Call patient. Patient is due/overdue for an OV. Will refill meds, but needs to schedule an appointment.    "

## 2024-01-19 NOTE — Telephone Encounter (Signed)
"  Medication:   Requested Prescriptions     Pending Prescriptions Disp Refills    atorvastatin  (LIPITOR ) 10 MG tablet [Pharmacy Med Name: ATORVASTATIN  CALCIUM  10MG  TABS] 90 tablet 1     Sig: TAKE ONE TABLET BY MOUTH EVERY DAY     Last Filled:  08/08/2023    Last appt: 07/27/2023   Next appt: Visit date not found    Last OARRS:       03/24/2021    12:59 PM   RX Monitoring   Periodic Controlled Substance Monitoring Possible medication side effects, risk of tolerance/dependence & alternative treatments discussed.;No signs of potential drug abuse or diversion identified.        Data saved with a previous flowsheet row definition     "

## 2024-01-19 NOTE — Telephone Encounter (Signed)
"  LVM for pt to schedule an appt.   "

## 2024-01-25 NOTE — Telephone Encounter (Signed)
"  Patient schedule appointment 01/29/2024  "

## 2024-01-25 NOTE — Telephone Encounter (Signed)
"  Sent mcm   "

## 2024-01-29 ENCOUNTER — Ambulatory Visit
Admit: 2024-01-29 | Discharge: 2024-01-29 | Payer: Medicare (Managed Care) | Attending: Internal Medicine | Primary: Internal Medicine

## 2024-01-29 VITALS — BP 108/80 | HR 86 | Wt 244.2 lb

## 2024-01-29 DIAGNOSIS — E119 Type 2 diabetes mellitus without complications: Principal | ICD-10-CM

## 2024-01-29 MED ORDER — PIOGLITAZONE HCL 30 MG PO TABS
30 | ORAL_TABLET | Freq: Every evening | ORAL | 3 refills | Status: AC
Start: 2024-01-29 — End: ?

## 2024-01-29 NOTE — Progress Notes (Signed)
 "SUBJECTIVE-  Established patient here for a visit to manage acute and chronic medical conditions as detailed below.    Type 2 diabetes mellitus with hyperglycemia, without long-term current use of insulin  (HCC)  Diabetes Mellitus Type II:  Home blood sugar records reviewed: fasting range: 120-150 .  No significant episodes of hypoglycemia. Compliant with medications.  No polyuria, polydipsia, visual changes, foot problems, GI upset.  Diabetic diet compliance:  compliant most of the time.  Current exercise: none. Will check labs, and refill medications as appropriate.     Hypertension:  Denies CP, SOB, visual changes, dizziness, palpitations or HA.  He is adherent to a low sodium diet.  Blood pressure typically runs ok  outside of the office. Continue current medications.     Hyperlipidemia:  No new myalgias or GI upset on current medications.                  Lab Results   Component Value Date     LABA1C 7.5 04/27/2021     LABA1C 8.7 (H) 03/28/2021     LABA1C 8.8 01/01/2021                Lab Results   Component Value Date     CREATININE 0.9 04/27/2021                Lab Results   Component Value Date     ALT 9 (L) 04/27/2021     AST 11 (L) 04/27/2021      No components found for: CHLPL            Lab Results   Component Value Date     TRIG 100 04/27/2021                Lab Results   Component Value Date     HDL 33 (L) 04/27/2021                Lab Results   Component Value Date/Time     LDLCALC 91 04/27/2021 04:05 PM      This problem was reviewed in detail. It is under control and stable. Will check blood work.        Spinal stenosis of lumbar region with neurogenic claudication  S/p surgery , doing ok.       Morbid obesity due to excess calories [facility])  Patient counseled,   Encouraged to lose weight, watch diet and exercise consistently.        Essential hypertension  This is a chronic problem. The problem is well controlled.  Patient monitors readings regularly. Pertinent negatives include no chest pain, focal  sensory loss, focal weakness, leg pain, myalgias or shortness of breath. No headaches or chest pain. Takes medications regularly.  Blood pressure has been stable, blood work was reviewed, and advised patient to continue the current instructions or medications.        Acquired hypothyroidism  On synthroid ,  Patient is compliant w medications, no side effects, effective, provides adequate symptom relief. No new symptoms or problems as noted by patient.  The problem is stable, no changes noted by patient. Will consider monitoring labs and refill medications as appropriate. Patient counseled and will continue current plan.       Pure hypercholesterolemia  This has been a long standing problem, takes lipitor        Monitors diet and tries to follow a low fat diet. Has  been reasonably  compliant w exercise. Lipids have been stable,  The problem is controlled. Recent lipid tests were reviewed and are normal. Pertinent negatives include no chest pain, focal sensory loss, focal weakness, leg pain, myalgias or shortness of breath.      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    Type 2 diabetes mellitus without complication (HCC)     Type II or unspecified type diabetes mellitus without mention of complication, not stated as uncontrolled     Unspecified sleep apnea         Current Outpatient Medications   Medication Sig Dispense Refill    atorvastatin  (LIPITOR ) 10 MG tablet TAKE ONE TABLET BY MOUTH EVERY DAY 90 tablet 1    metFORMIN  (GLUCOPHAGE ) 500 MG tablet TAKE TWO TABLETS BY MOUTH TWICE A DAY WITH MEALS 360 tablet 1    omeprazole  (PRILOSEC) 40 MG delayed release capsule TAKE ONE CAPSULE BY MOUTH EVERY DAY 90 capsule 1    ELIQUIS  5 MG TABS tablet TAKE ONE TABLET BY MOUTH  TWICE A DAY 180 tablet 0    linagliptin  (TRADJENTA ) 5 MG tablet TAKE ONE TABLET BY MOUTH EVERY DAY 90 tablet 1    losartan -hydroCHLOROthiazide  (HYZAAR ) 100-25 MG per tablet TAKE ONE TABLET BY MOUTH EVERY DAY 90 tablet 1    carvedilol  (COREG ) 25 MG tablet TAKE 1 TABLET BY MOUTH 2 TIMES A DAY 180 tablet 1    baclofen  (LIORESAL ) 10 MG tablet Take 1 tablet by mouth 3 times daily 90 tablet 4    glimepiride  (AMARYL ) 2 MG tablet TAKE ONE TABLET BY MOUTH TWO TIMES A DAY WITH MEALS 180 tablet 3    levothyroxine  (SYNTHROID ) 100 MCG tablet TAKE ONE TABLET BY MOUTH EVERY DAY 90 tablet 3    pioglitazone  (ACTOS ) 30 MG tablet Take 1 tablet by mouth at bedtime 90 tablet 3    Accu-Chek FastClix Lancets MISC Test once daily and prn dx 250.00 102 each 1    blood glucose test strips (ACCU-CHEK AVIVA PLUS) strip TEST BLOOD SUGAR ONCE DAILY dx E11.9 100 strip 5    furosemide  (LASIX ) 20 MG tablet Take 1 tablet by mouth daily 30 tablet 5    Misc Natural Products (OSTEO BI-FLEX JOINT SHIELD PO) Take by mouth daily      Blood Glucose Monitoring Suppl DEVI by Does not apply route. Diagnosis   250.00 1 Device 0    aspirin  81 MG EC tablet Take 1 tablet by mouth daily (Patient not taking: Reported on 01/29/2024) 30 tablet 0    metFORMIN  (GLUCOPHAGE ) 1000 MG tablet TAKE 1 TABLET BY MOUTH  TWICE A DAY WITH MEALS 180 tablet 3    propafenone  (RYTHMOL ) 300 MG tablet Take 0.5 tablets by mouth 3 times daily       No current facility-administered medications for this visit.        No Known Allergies       ROS: No unusual headaches or allergy symptoms or blurred vision.  No prolonged cough. No chest pain,dizziness, dyspnea, palpitations, or chest pain on exertion. No nausea or vommitting or diarrhea.  No abdominal pain, change in bowel habits, black or bloody stools.  No urinary tract  symptoms.  No new or unusual musculoskeletal symptoms.  No numbness, weakness, or tingling. No falls, or loss of consciousness. No weight loss or back pain. No  paresthesias. No joint swelling or redness. No recent weight loss. No focal weakness or sensory deficits. No hematemesis. No hearing loss. No siezures.     OBJECTIVE-  BP 108/80 (BP Site: Right Upper Arm, Patient Position: Sitting, BP Cuff Size: Medium Adult)   Pulse 86   Wt 110.8 kg (244 lb 3.2 oz)   SpO2 99%   BMI 37.13 kg/m    The physical exam reveals a patient who appears well, alert and oriented x 3, pleasant, cooperative. Vitals are as noted. Head is atraumatic and normocephalic. Eyes reveal normal conjunctiva, cornea normal, pupils are equal and rective to light. Nasal mucosa is normal. Throat is normal without exudates. Ears reveal normal tympanic membranes.Neck is supple and free of adenopathy, or masses. No thyromegaly. No jugular venous distension. Lungs are clear to auscultation, no rales or rhonchi noted. Heart sounds are regular , no murmurs, clicks, gallops or rubs. Abdomen is soft, no tenderness, masses or organomegaly. Bowel sounds are normally heard.Pelvis: normal. Extremities are normal. Peripheral pulses are normal. Screening neurological exam is normal without focal findings. Cranial nerves are intact, reflexes are symmetrical and muscle strength eaqual. Skin is normal without suspicious lesions noted.         ASSESSMENT / PLAN-  Type 2 diabetes mellitus with hyperglycemia, without long-term current use of insulin  (HCC)  Diabetes Mellitus Type II:  Home blood sugar records reviewed: fasting range: 120-150 .  No significant episodes of hypoglycemia. Compliant with medications.  No polyuria, polydipsia, visual changes, foot problems, GI upset.  Diabetic diet compliance:  compliant most of the time.  Current exercise: none. Will check labs, and refill medications as appropriate.     Hypertension:  Denies CP, SOB, visual changes, dizziness, palpitations or HA.  He is adherent to a low sodium diet.  Blood pressure typically runs ok  outside of the office. Continue current medications.      Hyperlipidemia:  No new myalgias or GI upset on current medications.                  Lab Results   Component Value Date     LABA1C 7.5 04/27/2021     LABA1C 8.7 (H) 03/28/2021     LABA1C 8.8 01/01/2021                Lab Results   Component Value Date     CREATININE 0.9 04/27/2021                Lab Results   Component Value Date     ALT 9 (L) 04/27/2021     AST 11 (L) 04/27/2021      No components found for: CHLPL            Lab Results   Component Value Date     TRIG 100 04/27/2021                Lab Results   Component Value Date     HDL 33 (L) 04/27/2021                Lab Results   Component Value Date/Time     LDLCALC 91 04/27/2021 04:05 PM      This problem was reviewed in detail. It is under control and stable. Will check blood work.  Spinal stenosis of lumbar region with neurogenic claudication  S/p surgery , doing ok.       Morbid obesity due to excess calories [facility])  Patient counseled,   Encouraged to lose weight, watch diet and exercise consistently.        Essential hypertension  This is a chronic problem. The problem is well controlled.  Patient monitors readings regularly. Pertinent negatives include no chest pain, focal sensory loss, focal weakness, leg pain, myalgias or shortness of breath. No headaches or chest pain. Takes medications regularly.  Blood pressure has been stable, blood work was reviewed, and advised patient to continue the current instructions or medications.        Acquired hypothyroidism  On synthroid ,  Patient is compliant w medications, no side effects, effective, provides adequate symptom relief. No new symptoms or problems as noted by patient.  The problem is stable, no changes noted by patient. Will consider monitoring labs and refill medications as appropriate. Patient counseled and will continue current plan.       Pure hypercholesterolemia  This has been a long standing problem, takes lipitor        Monitors diet and tries to follow a low fat diet. Has  been  reasonably  compliant w exercise. Lipids have been stable, The problem is controlled. Recent lipid tests were reviewed and are normal. Pertinent negatives include no chest pain, focal sensory loss, focal weakness, leg pain, myalgias or shortness of breath.  "

## 2024-01-31 ENCOUNTER — Encounter: Payer: Medicare (Managed Care) | Attending: Internal Medicine | Primary: Internal Medicine

## 2024-02-19 ENCOUNTER — Encounter

## 2024-02-19 MED ORDER — SYNTHROID 100 MCG PO TABS
100 | ORAL_TABLET | Freq: Every day | ORAL | 3 refills | 90.00000 days | Status: AC
Start: 2024-02-19 — End: ?

## 2024-02-19 NOTE — Telephone Encounter (Signed)
"  Refill Request     CONFIRM preferred pharmacy with the patient.    If Mail Order Rx - Pend for 90 day refill.      Last Seen: Last Seen Department: 01/29/2024  Last Seen by PCP: 01/29/2024    Last Written: 04/03/2023 Levothyroxine  #90 with 3 refills    If no future appointment scheduled:  Review the last OV with PCP and review information for follow-up visit,  Route STAFF MESSAGE with patient name to the [facility] for scheduling with the following information:            -  Timing of next visit           -  Visit type ie Physical, OV, etc           -  Diagnoses/Reason ie. COPD, HTN - Do not use MEDICATION, Follow-up or CHECK UP - Give reason for visit      Next Appointment:   No future appointments.    Message sent to Front Desk to schedule appt with patient?  NO      Requested Prescriptions     Pending Prescriptions Disp Refills    SYNTHROID  100 MCG tablet [Pharmacy Med Name: SYNTHROID  TABS] 90 tablet 3     Sig: TAKE ONE TABLET BY MOUTH EVERY DAY      "

## 2024-02-20 NOTE — Care Coordination (Signed)
"  Ambulatory Care Coordination Note     02/20/2024 3:54 PM     Patient outreach attempt by this ACM today to offer care management services. ACM was unable to reach the patient by telephone today;   left voice message requesting a return phone call to this ACM.     ACM: Sumedh Shinsato Wanda Clause, RN     Care Summary Note: [facility] - PMH: Afib, CHF, DM2,  HTN - UTRx1    PCP/Specialist follow up:       Follow Up:   Plan for next ACM outreach in approximately 1 week to complete:  - outreach attempt to offer care management services.             Alston Berrie Clause BSN, RN     Ambulatory Care Manager    [facility]    Phone: [phone]    [email or web address]      "

## 2024-02-26 NOTE — Telephone Encounter (Signed)
"  I agree with the Care Coordinator's Plan of Care    "

## 2024-02-26 NOTE — Care Coordination (Signed)
 "Ambulatory Care Coordination Note     02/26/2024 3:16 PM     Patient Current Location:  Home: [address]  [place] [address]     This patient was received as a referral from [employer] [facility] health report .    ACM contacted the patient by telephone. Verified name and DOB with patient as identifiers. Provided introduction to self, and explanation of the ACM role.   Patient accepted care management services at this time.          ACM: Caidin Heidenreich Wanda Clause, RN     Challenges to be reviewed by the provider   Additional needs identified to be addressed with provider Yes  Patient is requesting a referral to [facility], to Dr. Aleene Harry    [facility]  [address] [place]  [place], [place] [address]  Phone: [phone]  Fax: [phone]           Method of communication with provider: chart routing.    Utilization: Initial Call - N/A    Care Summary Note: ACM outreached patient who was pleasant & engaging.  Patient reported that he lives alone and is self sufficient.  Pt reports having all medications and no barriers to obtaining.  Pt reports that he has chronic back pain which prevents him from exercising and walking long distances.  Pt reports having a RW that he can use PRN for mobility support.  Pt denies falls.   Patient reports that he has recently lost weight.  He reports having COVID in September 2025 and since then he just hasn't had much of an appetite and some foods taste strange to him.  Pt reports that he is irregular with monitoring his BG and weight.  ACM reviewed importance of weight monitoring, explaining the 3-5 rule, pt VU. A1C was 6.0, patient manages with medications and limiting carb intake. Pt is agreeable to ACM mailing clinical references to his home address to review at later outreach.   Patient declines cp, sob, cough, dizziness, headache, n/v, diarrhea, abdominal pains, fever, or chills. Patient reports that appetite and fluid intake is adequate and denied any problems  with bowel or bladder. Patient reported that he is taking all medications as ordered. Patient denied any other needs at this time. Patient instructed to continue to monitor s/s, reporting any that may present to MD immediately for early intervention. Patient is agreeable to f/u calls.  Educated on the use of urgent care or physicians 24 hr access line if assistance is needed after hours.       Assessments Completed:       02/26/2024     3:02 PM   Amb Fall Risk Assessment and TUG Test   Do you feel unsteady or are you worried about falling?  no   2 or more falls in past year? no   Fall with injury in past year? no    ,   Ambulatory Care Coordination Assessment    Care Coordination Protocol  Referral from Primary Care Provider: No  Week 1 - Initial Assessment     Do you have all of your prescriptions and are they filled?: Yes  Barriers to medication adherence: None  Are you able to afford your medications?: Yes  How often do you have trouble taking your medications the way you have been told to take them?: I always take them as prescribed.     Do you have Home O2 Therapy?: No  Ability to seek help/take action for Emergent Urgent situations i.e. fire, crime, inclement weather or health crisis.: Independent  Ability to ambulate to restroom: Independent  Ability handle personal hygeine needs (bathing/dressing/grooming): Independent  Ability to manage Medications: Independent  Ability to prepare Food Preparation: Independent  Ability to maintain home (clean home, laundry): Independent  Ability to drive and/or has transportation: Independent  Ability to do shopping: Independent  Ability to manage finances: Independent  Is patient able to live independently?: Yes     Current Housing: Private Residence  Who do you live with?: Alone  Are you an active caregiver in your home?: No     Do you have any DME?: Yes  Patient DME: [facility]     Per the Fall Risk Screening, did the patient have 2 or more falls or 1 fall with injury  in the past year?: No     Frequent urination at night?: No  Do you use rails/bars?: Yes  Do you have a non-slip tub mat?: Yes     Are you experiencing loss of meaning?: No  Are you experiencing loss of hope and peace?: No     Thinking about your patient's physical health needs, are there any symptoms or problems (risk indicators) you are unsure about that require further investigation?: Mild vague physical symptoms or problems; but do not impact on daily life or are not of concern to patient   Are the patients physical health problems impacting on their mental well-being?: Mild impact on mental well-being e.g. feeling fed-up, reduced enjoyment   Are there any problems with your patients lifestyle behaviors (alcohol, drugs, diet, exercise) that are impacting on physical or mental well-being?: Some mild concern of potential negative impact on well-being   Do you have any other concerns about your patients mental well-being? How would you rate their severity and impact on the patient?: No identified areas of concern   How would you rate their home environment in terms of safety and stability (including domestic violence, insecure housing, neighbor harassment)?: Safe, stable, but with some inconsistency   How do daily activities impact on the patient's well-being? (include current or anticipated unemployment, work, caregiving, access to transportation or other): Some general dissatisfaction but no concern   How would you rate their social network (family, work, friends)?: Restricted participation with some degree of social isolation   How would you rate their financial resources (including ability to afford all required medical care)?: Financially secure, some resource challenges   How wells does the patient now understand their health and well-being (symptoms, signs or risk factors) and what they need to do to manage their health?: Reasonable to good understanding and already engages in managing health or is  willing to undertake better management   How well do you think your patient can engage in healthcare discussions? (Barriers include language, deafness, aphasia, alcohol or drug problems, learning difficulties, concentration): Adequate communication, with or without minor barriers   Do other services need to be involved to help this patient?: Other care/services not required at this time   Are current services involved with this patient well-coordinated? (Include coordination with other services you are now recommendation): All required care/services in place and well-coordinated   Suggested Interventions and Community Resources  Fall Risk Prevention: In Process Other Services or Interventions: Fall Safety, 2025 CHF Winter, My DM Action Plan w/Zones, BP Zones, Mailed   Zone Management Tools: In Process               ,  Diabetes Assessment    Medic Alert ID: Yes  Meal Planning: Calorie counting, Carb counting   How often do you test your blood sugar?: Daily   Do you have barriers with adherence to non-pharmacologic self-management interventions? (Nutrition/Exercise/Self-Monitoring): No   Have you ever had to go to the ED for symptoms of low blood sugar?: No       No patient-reported symptoms   Do you have hyperglycemia symptoms?: No   Do you have hypoglycemia symptoms?: No   Blood Sugar Monitoring Regimen: Once a Day         ,   Congestive Heart Failure Assessment    Are you currently restricting fluids?: 2000cc  Do you understand a low sodium diet?: Yes  Do you understand how to read food labels?: Yes  How many restaurant meals do you eat per week?: 0  Do you salt your food before tasting it?: No     No patient-reported symptoms      Symptoms:  None: Yes      Weight trend: decreasing steadily  Salt intake watch compared to last visit: stable      ,   Hypertension - Encounter Level          ,   General Assessment    Do you have any symptoms that are causing concern?: No      ,   Social Drivers of Health     Tobacco  Use: Low Risk  (12/26/2023)    Received from [facility]    Patient History     Smoking Tobacco Use: Never     Smokeless Tobacco Use: Never     Passive Exposure: Not on file   Alcohol Use: Not At Risk (02/26/2024)    AUDIT-C     Frequency of Alcohol Consumption: Never     Average Number of Drinks: Patient does not drink     Frequency of Binge Drinking: Never   Financial Resource Strain: Low Risk  (02/26/2024)    Overall Financial Resource Strain (CARDIA)     Difficulty of Paying Living Expenses: Not hard at all   Food Insecurity: No Food Insecurity (02/26/2024)    Hunger Vital Sign     Worried About Running Out of Food in the Last Year: Never true     Ran Out of Food in the Last Year: Never true   Transportation Needs: No Transportation Needs (02/26/2024)    PRAPARE - Therapist, Art (Medical): No     Lack of Transportation (Non-Medical): No   Physical Activity: Inactive (02/26/2024)    Exercise Vital Sign     Days of Exercise per Week: 0 days     Minutes of Exercise per Session: 0 min   Stress: No Stress Concern Present (02/26/2024)    [employer] of Occupational Health - Occupational Stress Questionnaire     Feeling of Stress: Only a little   Social Connections: Socially Isolated (02/26/2024)    Social Connection and Isolation Panel     Frequency of Communication with Friends and Family: Three times a week     Frequency of Social Gatherings with Friends and Family: Twice a week     Attends Religious Services: Never     Database Administrator or Organizations: No     Attends Banker Meetings: Never     Marital Status: Widowed   Intimate Partner Violence: Not on file   Depression: Not at risk (08/08/2023)  Received from [facility]    PHQ-2     PHQ-2 Total Score: 0   Housing Stability: Low Risk  (02/26/2024)    Housing Stability Vital Sign     Unable to Pay for Housing in the Last Year: No     Number of Times Moved in the Last Year: 0     Homeless in the Last Year: No    Interpersonal Safety: Not on file   Utilities: Not At Risk (02/26/2024)    [facility] Utilities     Threatened with loss of utilities: No        Medications Reviewed:   Not completed during this call: future outreach    Advance Care Planning:   Not reviewed during this call     Care Planning:   Education Documentation  Provide High Risk Information for [employer], taught by Myra Greig Reusing, RN at 02/26/2024  3:15 PM.  Learner: Patient  Readiness: Acceptance  Method: Explanation, Handout  Response: Verbalizes Understanding    Handout: Low Salt Diet, taught by Myra Greig Reusing, RN at 02/26/2024  3:15 PM.  Learner: Patient  Readiness: Acceptance  Method: Explanation, Handout  Response: Verbalizes Understanding    Diet, taught by Myra Greig Reusing, RN at 02/26/2024  3:15 PM.  Learner: Patient  Readiness: Acceptance  Method: Explanation, Handout  Response: Verbalizes Understanding    WHEN TO CALL THE DOCTOR CHF, taught by Myra Greig Reusing, RN at 02/26/2024  3:15 PM.  Learner: Patient  Readiness: Acceptance  Method: Explanation, Handout  Response: Verbalizes Understanding    Monitoring Weight, taught by Myra Greig Reusing, RN at 02/26/2024  3:15 PM.  Learner: Patient  Readiness: Acceptance  Method: Explanation, Handout  Response: Verbalizes Understanding    Education Comments  No comments found.     ,    Goals Addressed                   This Visit's Progress     Reduce Falls         I will reduce my risk of falls by the following: Remove rugs or use non slip rugs  Use walking aids like cane or walker    Barriers: fear of failure, overwhelmed by complexity of regimen, medication side effects, lack of education, and chronic back pain  Plan for overcoming my barriers: Work with ACM, continued outreaches, education and resources.    Confidence: 7/10  Anticipated Goal Completion Date: 02.15.26       Self Monitoring        Daily Weights - I will weight myself as directed - Daily and write down weights  I will  notify my provider of any increase in weight by 3 or more pounds in 2 days OR 5 or more pounds in a week.    None Recently Recorded    Barriers: fear of failure, lack of motivation, overwhelmed by complexity of regimen, medication side effects, and lack of education  Plan for overcoming my barriers: Work with ACM, continued outreaches, education and resources.    Confidence: 7/10  Anticipated Goal Completion Date: 02.15.26                 PCP/Specialist follow up:       Follow Up:   Plan for next ACM outreach in approximately 2 weeks to complete:  - medication review   - advance care planning   - goal progression  - education   - 2025 CHF Winter & clinical  references mailed - need reviewed     Patient  is agreeable to this plan.        Hiram Mciver Myra BSN, RN     Ambulatory Care Manager    [facility]    Phone: [phone]    [email or web address]        "

## 2024-03-01 NOTE — Telephone Encounter (Signed)
"  Do NOT need referral. He can call and make appointment.   "

## 2024-03-05 MED ORDER — CARVEDILOL 25 MG PO TABS
25 | ORAL_TABLET | Freq: Two times a day (BID) | ORAL | 1 refills | 30.00000 days | Status: AC
Start: 2024-03-05 — End: ?

## 2024-03-05 MED ORDER — FUROSEMIDE 20 MG PO TABS
20 | ORAL_TABLET | Freq: Every day | ORAL | 5 refills | 60.00000 days | Status: AC
Start: 2024-03-05 — End: ?

## 2024-03-05 MED ORDER — ELIQUIS 5 MG PO TABS
5 | ORAL_TABLET | Freq: Two times a day (BID) | ORAL | 0 refills | 30.00000 days | Status: AC
Start: 2024-03-05 — End: ?

## 2024-03-05 MED ORDER — GLIMEPIRIDE 2 MG PO TABS
2 | ORAL_TABLET | Freq: Two times a day (BID) | ORAL | 3 refills | 90.00000 days | Status: AC
Start: 2024-03-05 — End: ?

## 2024-03-05 MED ORDER — OMEPRAZOLE 40 MG PO CPDR
40 | ORAL_CAPSULE | Freq: Every day | ORAL | 0 refills | 90.00000 days | Status: DC
Start: 2024-03-05 — End: 2024-04-05

## 2024-03-05 NOTE — Telephone Encounter (Signed)
"  Medication:   Requested Prescriptions     Pending Prescriptions Disp Refills    carvedilol  (COREG ) 25 MG tablet [Pharmacy Med Name: CARVEDILOL  25 MG TABLET] 180 tablet 1     Sig: TAKE 1 TABLET BY MOUTH 2 TIMES A DAY     Last Filled:  5.19.25    Last appt: 01/29/2024   Next appt: Visit date not found    Last OARRS:       03/24/2021    12:59 PM   RX Monitoring   Periodic Controlled Substance Monitoring Possible medication side effects, risk of tolerance/dependence & alternative treatments discussed.;No signs of potential drug abuse or diversion identified.        Data saved with a previous flowsheet row definition     "

## 2024-03-05 NOTE — Telephone Encounter (Signed)
"  Please see Dr. Norvel note  "

## 2024-03-05 NOTE — Telephone Encounter (Signed)
"  Medication:   Requested Prescriptions     Pending Prescriptions Disp Refills    omeprazole  (PRILOSEC) 40 MG delayed release capsule [Pharmacy Med Name: OMEPRAZOLE  40MG  CPDR] 90 capsule 0     Sig: TAKE ONE CAPSULE BY MOUTH EVERY DAY     Last Filled:  9.16.25    Last appt: 01/29/2024   Next appt: Last OARRS:       03/24/2021    12:59 PM   RX Monitoring   Periodic Controlled Substance Monitoring Possible medication side effects, risk of tolerance/dependence & alternative treatments discussed.;No signs of potential drug abuse or diversion identified.        Data saved with a previous flowsheet row definition     "

## 2024-03-05 NOTE — Telephone Encounter (Signed)
"  Medication:   Requested Prescriptions     Pending Prescriptions Disp Refills    furosemide  (LASIX ) 20 MG tablet [Pharmacy Med Name: FUROSEMIDE  20MG  TABS] 30 tablet 5     Sig: TAKE ONE TABLET BY MOUTH EVERY DAY    ELIQUIS  5 MG TABS tablet [Pharmacy Med Name: ELIQUIS  ORAL TAB 5 MG] 180 tablet 0     Sig: TAKE ONE TABLET BY MOUTH TWICE A DAY     Last Filled: 4.4.24 9.16.25    Last appt: 01/29/2024   Next appt:   Last OARRS:       03/24/2021    12:59 PM   RX Monitoring   Periodic Controlled Substance Monitoring Possible medication side effects, risk of tolerance/dependence & alternative treatments discussed.;No signs of potential drug abuse or diversion identified.        Data saved with a previous flowsheet row definition     "

## 2024-03-05 NOTE — Telephone Encounter (Signed)
"  Medication:   Requested Prescriptions     Pending Prescriptions Disp Refills    glimepiride  (AMARYL ) 2 MG tablet [Pharmacy Med Name: GLIMEPIRIDE  2MG  TABS] 180 tablet 3     Sig: TAKE ONE TABLET BY MOUTH TWO TIMES A DAY WITH MEALS     Last Filled:  04/19/2023    Last appt: 01/29/2024   Next appt: Visit date not found    Last OARRS:       03/24/2021    12:59 PM   RX Monitoring   Periodic Controlled Substance Monitoring Possible medication side effects, risk of tolerance/dependence & alternative treatments discussed.;No signs of potential drug abuse or diversion identified.        Data saved with a previous flowsheet row definition     "

## 2024-03-08 NOTE — Care Coordination (Signed)
 "Ambulatory Care Coordination Note     03/08/2024 3:11 PM     Patient Current Location:  Home: [address]  [place] [address]     ACM contacted the patient by telephone. Verified name and DOB with patient as identifiers.         ACM: Carl Matthis Blair Clause, RN     Challenges to be reviewed by the provider   Additional needs identified to be addressed with provider No                 Method of communication with provider: none.    Utilization: Patient has not had any utilization since our last call.     Care Summary Note: ACM spoke to patient who was pleasant & engaging.  Patient reported he was able to schedule with [facility] but unable to get in till March 2026.  Pt reported he asked to be on a cancellation list. Pt reported he enjoyed time with family yesterday (Thanksgiving). Pt reported his weight and BG has been stable.  Pt reported being steady on his feet, no falls.  Patient declines cp, sob, cough, dizziness, headache, n/v, diarrhea, abdominal pains, fever, or chills. Patient reports that appetite and fluid intake is good and denied any problems with bowel or bladder. Patient reported that he is taking all medications as ordered. Patient denied any other needs at this time. Patient instructed to continue to monitor s/s, reporting any that may present to MD immediately for early intervention. Patient is agreeable to f/u calls.  Educated on the use of urgent care or physicians 24 hr access line if assistance is needed after hours.       Assessments Completed:   Diabetes Assessment    Medic Alert ID: Yes  Meal Planning: Calorie counting, Carb counting   How often do you test your blood sugar?: Daily   Do you have barriers with adherence to non-pharmacologic self-management interventions? (Nutrition/Exercise/Self-Monitoring): No   Have you ever had to go to the ED for symptoms of low blood sugar?: No       No patient-reported symptoms   Do you have hyperglycemia symptoms?: No   Do you have  hypoglycemia symptoms?: No         ,   Congestive Heart Failure Assessment    Are you currently restricting fluids?: 2000cc  Do you understand a low sodium diet?: Yes  Do you understand how to read food labels?: Yes  How many restaurant meals do you eat per week?: 0  Do you salt your food before tasting it?: No     No patient-reported symptoms      Symptoms:  None: Yes            ,   Hypertension - Encounter Level          ,   General Assessment    Do you have any symptoms that are causing concern?: No          Medications Reviewed:   Completed during this call    Advance Care Planning:   Not on file; education provided  Health Care Decision maker confirmed    Primary Decision Maker: Carl Blair - [phone]    Secondary Decision Maker: Carl Blair - Brother/Sister - [phone]   Pt reported having a LW     Care Planning:   Education Documentation  Educate physical activity cessation, taught by Blair Carl Wanda, RN at 03/08/2024  3:10 PM.  Learner: Patient  Readiness: Acceptance  Method: Explanation  Response: Verbalizes Understanding    Handout: Low Salt Diet, taught by Carl Carl Reusing, RN at 03/08/2024  3:10 PM.  Learner: Patient  Readiness: Acceptance  Method: Explanation  Response: Verbalizes Understanding    Diet, taught by Carl Carl Reusing, RN at 03/08/2024  3:10 PM.  Learner: Patient  Readiness: Acceptance  Method: Explanation  Response: Verbalizes Understanding    WHEN TO CALL THE DOCTOR CHF, taught by Carl Carl Reusing, RN at 03/08/2024  3:10 PM.  Learner: Patient  Readiness: Acceptance  Method: Explanation  Response: Verbalizes Understanding    Monitoring Weight, taught by Carl Carl Reusing, RN at 03/08/2024  3:10 PM.  Learner: Patient  Readiness: Acceptance  Method: Explanation  Response: Verbalizes Understanding    Education Comments  No comments found.     ,    Goals Addressed                   This Visit's Progress     Reduce Falls    On track     I will  reduce my risk of falls by the following: Remove rugs or use non slip rugs  Use walking aids like cane or walker    Barriers: fear of failure, overwhelmed by complexity of regimen, medication side effects, lack of education, and chronic back pain  Plan for overcoming my barriers: Work with ACM, continued outreaches, education and resources.    Confidence: 7/10  Anticipated Goal Completion Date: 02.15.26       Self Monitoring   On track     Daily Weights - I will weight myself as directed - Daily and write down weights  I will notify my provider of any increase in weight by 3 or more pounds in 2 days OR 5 or more pounds in a week.    None Recently Recorded    Barriers: fear of failure, lack of motivation, overwhelmed by complexity of regimen, medication side effects, and lack of education  Plan for overcoming my barriers: Work with ACM, continued outreaches, education and resources.    Confidence: 7/10  Anticipated Goal Completion Date: 02.15.26               Heart Failure Education outreach Date/Time: 03/08/2024 3:16 PM    Ambulatory Care Manager (ACM) contacted the patient by telephone to perform Ambulatory Care Coordination. Verified name and DOB with patient as identifiers. Provided introduction to self, and explanation of the Ambulatory Care Manager's role.     ACM reviewed that a Heart Healthy tips for the Holiday packet has been mailed to the them. ACM reviewed CHF zones, daily weights, fluid restriction, the importance of low sodium diet, and healthy tips packet with the patient. Instructed patient to call their Cardiologist if they have a weight gain of 3 lbs in 2 days or 5 lbs in a week.     Patient reminded that there is a physician on call 24 hours a day / 7 days a week should the patient have questions or concerns. The patient verbalized understanding.      PCP/Specialist follow up:       Follow Up:   Plan for next ACM outreach in approximately 1 month to complete:  - goal progression  - education   -  follow up appointment with [facility] March 2026  - BP, BG, Wt monitoring     Patient  is agreeable to this plan.     Genevieve Arbaugh Carl BSN, RN  Ambulatory Care Manager    [facility]    Phone: [phone]    [email or web address]           "

## 2024-03-21 MED ORDER — LOSARTAN POTASSIUM-HCTZ 100-25 MG PO TABS
100-25 | ORAL_TABLET | Freq: Every day | ORAL | 1 refills | Status: AC
Start: 2024-03-21 — End: ?

## 2024-03-21 NOTE — Telephone Encounter (Signed)
"  Medication:   Requested Prescriptions     Pending Prescriptions Disp Refills    losartan -hydroCHLOROthiazide  (HYZAAR ) 100-25 MG per tablet [Pharmacy Med Name: LOSARTAN  -HCTZ 100-25 MG TABS] 90 tablet 1     Sig: TAKE ONE TABLET BY MOUTH EVERY DAY     Last Filled:  10/16/2023    Last appt: 01/29/2024   Next appt: Visit date not found    Last OARRS:       03/24/2021    12:59 PM   RX Monitoring   Periodic Controlled Substance Monitoring Possible medication side effects, risk of tolerance/dependence & alternative treatments discussed.;No signs of potential drug abuse or diversion identified.        Data saved with a previous flowsheet row definition     "

## 2024-04-02 NOTE — Care Coordination (Signed)
 "Ambulatory Care Coordination Note     04/02/2024 12:03 PM     Patient Current Location:  Home: [address]  [place] [address]     ACM contacted the patient by telephone. Verified name and DOB with patient as identifiers.         ACM: Carl Mccathern Wanda Clause, RN     Challenges to be reviewed by the provider   Additional needs identified to be addressed with provider No                 Method of communication with provider: none.    Utilization: Patient has not had any utilization since our last call.     Care Summary Note: ACM spoke to patient who was pleasant & engaging.  He reported that he has been doing well.  Pt reports that he has lost a few pounds intentionally, denied edema to BLE. He is weighing himself most mornings and checking his fasting BG.  He reports the BG has been good, no variables provided.  ACM reviewed zones and red flags.  Reviewed dietary needs, low Na, and limited carbs.Patient denies falls, reports being steady on his feet.   Patient declines cp, sob, cough, dizziness, headache, n/v, diarrhea, abdominal pains, fever, or chills. Patient reports that appetite and fluid intake is good and denied any problems with bowel or bladder. Patient reported that he is taking all medications as ordered. Patient denied any other needs at this time. Patient instructed to continue to monitor s/s, reporting any that may present to MD immediately for early intervention. Patient is agreeable to f/u calls.  Educated on the use of urgent care or physicians 24 hr access line if assistance is needed after hours.       Assessments Completed:   Diabetes Assessment    Medic Alert ID: Yes  Meal Planning: Calorie counting, Carb counting   How often do you test your blood sugar?: Daily   Do you have barriers with adherence to non-pharmacologic self-management interventions? (Nutrition/Exercise/Self-Monitoring): No   Have you ever had to go to the ED for symptoms of low blood sugar?: No       No patient-reported  symptoms   Do you have hyperglycemia symptoms?: No   Do you have hypoglycemia symptoms?: No         ,   Congestive Heart Failure Assessment    Are you currently restricting fluids?: 2000cc  Do you understand a low sodium diet?: Yes  Do you understand how to read food labels?: Yes  How many restaurant meals do you eat per week?: 0  Do you salt your food before tasting it?: No     Other symptoms causing concern      Symptoms:  None: Yes      Weight trend: stable      ,   Hypertension - Encounter Level          ,   General Assessment    Do you have any symptoms that are causing concern?: No          Medications Reviewed:   Patient denies any changes with medications and reports taking all medications as prescribed.    Advance Care Planning:   Not on file; education provided  Health Care Decision maker confirmed    Primary Decision Maker: [place] Reginal - [phone]    Secondary Decision Maker: [place] - Brother/Sister - [phone]      Care Planning:   Education Documentation  Educate transfer safety, taught by Myra Greig Reusing, RN at 04/02/2024 12:01 PM.  Learner: Patient  Readiness: Acceptance  Method: Explanation  Response: Verbalizes Understanding    Provide High Risk Information for [facility], taught by Myra Greig Reusing, RN at 04/02/2024 12:01 PM.  Learner: Patient  Readiness: Acceptance  Method: Explanation  Response: Verbalizes Understanding    Educate medication side effects, taught by Myra Greig Reusing, RN at 04/02/2024 12:01 PM.  Learner: Patient  Readiness: Acceptance  Method: Explanation  Response: Verbalizes Understanding    Educate safety precautions, taught by Myra Greig Reusing, RN at 04/02/2024 12:01 PM.  Learner: Patient  Readiness: Acceptance  Method: Explanation  Response: Verbalizes Understanding    Educate home safety, taught by Myra Greig Reusing, RN at 04/02/2024 12:01 PM.  Learner: Patient  Readiness: Acceptance  Method: Explanation  Response:  Verbalizes Understanding    Educate bathing safety, taught by Myra Greig Reusing, RN at 04/02/2024 12:01 PM.  Learner: Patient  Readiness: Acceptance  Method: Explanation  Response: Verbalizes Understanding    Educate ambulation safety, taught by Myra Greig Reusing, RN at 04/02/2024 12:01 PM.  Learner: Patient  Readiness: Acceptance  Method: Explanation  Response: Verbalizes Understanding    Handout: Low Salt Diet, taught by Myra Greig Reusing, RN at 04/02/2024 12:01 PM.  Learner: Patient  Readiness: Acceptance  Method: Explanation  Response: Verbalizes Understanding    Diet, taught by Myra Greig Reusing, RN at 04/02/2024 12:01 PM.  Learner: Patient  Readiness: Acceptance  Method: Explanation  Response: Verbalizes Understanding    Identify Meds, Dose, and Schedule, taught by Myra Greig Reusing, RN at 04/02/2024 12:01 PM.  Learner: Patient  Readiness: Acceptance  Method: Explanation  Response: Verbalizes Understanding    General medication information, taught by Myra Greig Reusing, RN at 04/02/2024 12:01 PM.  Learner: Patient  Readiness: Acceptance  Method: Explanation  Response: Verbalizes Understanding    Diuretics, taught by Myra Greig Reusing, RN at 04/02/2024 12:01 PM.  Learner: Patient  Readiness: Acceptance  Method: Explanation  Response: Verbalizes Understanding    Education Comments  No comments found.     ,    Goals Addressed                   This Visit's Progress     Reduce Falls    On track     I will reduce my risk of falls by the following: Remove rugs or use non slip rugs  Use walking aids like cane or walker    Barriers: fear of failure, overwhelmed by complexity of regimen, medication side effects, lack of education, and chronic back pain  Plan for overcoming my barriers: Work with [facility], continued outreaches, education and resources.    Confidence: 7/10  Anticipated Goal Completion Date: 02.15.26       Self Monitoring   On track     Daily Weights - I will weight myself as directed -  Daily and write down weights  I will notify my provider of any increase in weight by 3 or more pounds in 2 days OR 5 or more pounds in a week.    None Recently Recorded    Barriers: fear of failure, lack of motivation, overwhelmed by complexity of regimen, medication side effects, and lack of education  Plan for overcoming my barriers: Work with ACM, continued outreaches, education and resources.    Confidence: 7/10  Anticipated Goal Completion Date: 02.15.26  PCP/Specialist follow up:       Follow Up:   Plan for next ACM outreach in approximately 3 weeks to complete:  - goal progression  - education   - QD Wt & BG     Patient  is agreeable to this plan.       Marcelles Clinard Myra BSN, RN     Ambulatory Care Manager    [facility]    Phone: [phone]    [email or web address]           "

## 2024-04-03 NOTE — Telephone Encounter (Signed)
"  Medication:   Requested Prescriptions     Pending Prescriptions Disp Refills    omeprazole  (PRILOSEC) 40 MG delayed release capsule [Pharmacy Med Name: OMEPRAZOLE  40MG  CPDR] 90 capsule 0     Sig: TAKE ONE CAPSULE BY MOUTH EVERY DAY     Last Filled:  03/05/2024    Last appt: 01/29/2024   Next appt: Visit date not found    Last OARRS:       03/24/2021    12:59 PM   RX Monitoring   Periodic Controlled Substance Monitoring Possible medication side effects, risk of tolerance/dependence & alternative treatments discussed.;No signs of potential drug abuse or diversion identified.        Data saved with a previous flowsheet row definition     "

## 2024-04-05 MED ORDER — OMEPRAZOLE 40 MG PO CPDR
40 | ORAL_CAPSULE | Freq: Every day | ORAL | 0 refills | 90.00000 days | Status: DC
Start: 2024-04-05 — End: 2024-04-22

## 2024-04-23 MED ORDER — OMEPRAZOLE 40 MG PO CPDR
40 | ORAL_CAPSULE | Freq: Every day | ORAL | 1 refills | Status: AC
Start: 2024-04-23 — End: ?

## 2024-04-23 NOTE — Telephone Encounter (Signed)
 Medication:   Requested Prescriptions     Pending Prescriptions Disp Refills    omeprazole  (PRILOSEC) 40 MG delayed release capsule 90 capsule 0     Sig: Take 1 capsule by mouth daily     Last Filled:  12.26.25    Last appt: 01/29/2024   Next appt: Visit date not found    Last OARRS:       03/24/2021    12:59 PM   RX Monitoring   Periodic Controlled Substance Monitoring Possible medication side effects, risk of tolerance/dependence & alternative treatments discussed.;No signs of potential drug abuse or diversion identified.        Data saved with a previous flowsheet row definition

## 2024-04-24 NOTE — Care Coordination (Signed)
 Ambulatory Care Coordination Note     04/24/2024 12:47 PM     Patient outreach attempt by this ACM today to perform care management follow up . ACM was unable to reach the patient by telephone today;   left voice message requesting a return phone call to this ACM.     ACM: Maeola LITTIE Kawasaki, RN     Care Summary Note: UTRX1    PCP/Specialist follow up:       Follow Up:   Plan for next ACM outreach in approximately 1 week to complete:  - goal progression  - education   QD Wt and BG.

## 2024-05-01 NOTE — Care Coordination (Signed)
 Ambulatory Care Coordination Note     05/01/2024 12:16 PM     ACM outreach attempt by this ACM today to perform care management follow up . ACM was unable to reach the patient by telephone today;   left voice message requesting a return phone call to this ACM.     ACM: Nabeel Gladson Wanda Clause, RN     Care Summary Note: UTRx2    PCP/Specialist follow up:       Follow Up:   Plan for next ACM outreach in approximately 1 week to complete:  - goal progression  - education   - Wt, BG and BP QD?SABRA       Toshio Slusher Clause BSN, RN     Ambulatory Care Manager    [facility]    Phone: [phone]    [email or web address]

## 2024-05-08 NOTE — Care Coordination (Signed)
 Ambulatory Care Coordination Note     05/08/2024 12:19 PM     patient outreach attempt by this ACM today to perform care management follow up . ACM was unable to reach the patient by telephone today;   left voice message requesting a return phone call to this ACM.     Patient closed (patient disengaged) from the High Risk Care Management program on 05/08/2024.    No further Ambulatory Care Manager follow up scheduled.         Vishnu Moeller Myra BSN, RN     Ambulatory Care Manager    [facility]    Phone: [phone]    [email or web address]
